# Patient Record
Sex: Female | Born: 1945
Health system: Southern US, Community
[De-identification: ages and names within clinical notes are randomized; demographics above are authoritative.]

## PROBLEM LIST (undated history)

## (undated) DIAGNOSIS — N189 Chronic kidney disease, unspecified: Secondary | ICD-10-CM

## (undated) DIAGNOSIS — R112 Nausea with vomiting, unspecified: Secondary | ICD-10-CM

## (undated) DIAGNOSIS — Z9889 Other specified postprocedural states: Secondary | ICD-10-CM

## (undated) DIAGNOSIS — I739 Peripheral vascular disease, unspecified: Secondary | ICD-10-CM

## (undated) DIAGNOSIS — I82409 Acute embolism and thrombosis of unspecified deep veins of unspecified lower extremity: Secondary | ICD-10-CM

## (undated) DIAGNOSIS — R519 Headache, unspecified: Secondary | ICD-10-CM

## (undated) DIAGNOSIS — K5732 Diverticulitis of large intestine without perforation or abscess without bleeding: Secondary | ICD-10-CM

## (undated) DIAGNOSIS — M1990 Unspecified osteoarthritis, unspecified site: Secondary | ICD-10-CM

## (undated) DIAGNOSIS — E785 Hyperlipidemia, unspecified: Secondary | ICD-10-CM

## (undated) DIAGNOSIS — M8580 Other specified disorders of bone density and structure, unspecified site: Secondary | ICD-10-CM

## (undated) DIAGNOSIS — R0600 Dyspnea, unspecified: Secondary | ICD-10-CM

## (undated) DIAGNOSIS — T7840XA Allergy, unspecified, initial encounter: Secondary | ICD-10-CM

## (undated) DIAGNOSIS — I10 Essential (primary) hypertension: Secondary | ICD-10-CM

## (undated) DIAGNOSIS — J302 Other seasonal allergic rhinitis: Secondary | ICD-10-CM

## (undated) DIAGNOSIS — N649 Disorder of breast, unspecified: Secondary | ICD-10-CM

## (undated) DIAGNOSIS — E079 Disorder of thyroid, unspecified: Secondary | ICD-10-CM

## (undated) HISTORY — DX: Disorder of breast, unspecified: N64.9

## (undated) HISTORY — DX: Other specified disorders of bone density and structure, unspecified site: M85.80

## (undated) HISTORY — PX: WISDOM TOOTH EXTRACTION: SHX21

## (undated) HISTORY — PX: APPENDECTOMY: SHX54

## (undated) HISTORY — DX: Allergy, unspecified, initial encounter: T78.40XA

## (undated) HISTORY — DX: Acute embolism and thrombosis of unspecified deep veins of unspecified lower extremity: I82.409

## (undated) HISTORY — DX: Other seasonal allergic rhinitis: J30.2

## (undated) HISTORY — PX: BREAST BIOPSY: SHX20

## (undated) HISTORY — DX: Disorder of thyroid, unspecified: E07.9

## (undated) HISTORY — PX: ROTATOR CUFF REPAIR: SHX139

## (undated) HISTORY — PX: VARICOSE VEIN SURGERY: SHX832

## (undated) HISTORY — PX: ABDOMINAL HYSTERECTOMY: SHX81

## (undated) HISTORY — DX: Essential (primary) hypertension: I10

## (undated) HISTORY — PX: THYROID SURGERY: SHX805

## (undated) HISTORY — PX: EYE SURGERY: SHX253

## (undated) HISTORY — DX: Hyperlipidemia, unspecified: E78.5

## (undated) HISTORY — DX: Chronic kidney disease, unspecified: N18.9

---

## 1965-05-10 HISTORY — PX: THYROID SURGERY: SHX805

## 1978-05-10 DIAGNOSIS — C50919 Malignant neoplasm of unspecified site of unspecified female breast: Secondary | ICD-10-CM

## 1978-05-10 HISTORY — DX: Malignant neoplasm of unspecified site of unspecified female breast: C50.919

## 1982-05-10 HISTORY — PX: MASTECTOMY: SHX3

## 1998-04-07 ENCOUNTER — Other Ambulatory Visit: Admission: RE | Admit: 1998-04-07 | Discharge: 1998-04-07 | Payer: Self-pay | Admitting: Obstetrics & Gynecology

## 1998-08-08 ENCOUNTER — Encounter: Payer: Self-pay | Admitting: *Deleted

## 1998-08-08 ENCOUNTER — Ambulatory Visit (HOSPITAL_COMMUNITY): Admission: RE | Admit: 1998-08-08 | Discharge: 1998-08-08 | Payer: Self-pay | Admitting: *Deleted

## 1999-04-21 ENCOUNTER — Other Ambulatory Visit: Admission: RE | Admit: 1999-04-21 | Discharge: 1999-04-21 | Payer: Self-pay | Admitting: Obstetrics & Gynecology

## 1999-08-18 ENCOUNTER — Encounter: Admission: RE | Admit: 1999-08-18 | Discharge: 1999-08-18 | Payer: Self-pay | Admitting: Family Medicine

## 1999-08-18 ENCOUNTER — Encounter: Payer: Self-pay | Admitting: Family Medicine

## 2000-08-23 ENCOUNTER — Ambulatory Visit (HOSPITAL_COMMUNITY): Admission: RE | Admit: 2000-08-23 | Discharge: 2000-08-23 | Payer: Self-pay | Admitting: Family Medicine

## 2000-08-23 ENCOUNTER — Encounter: Payer: Self-pay | Admitting: Family Medicine

## 2001-08-30 ENCOUNTER — Encounter: Payer: Self-pay | Admitting: Family Medicine

## 2001-08-30 ENCOUNTER — Ambulatory Visit (HOSPITAL_COMMUNITY): Admission: RE | Admit: 2001-08-30 | Discharge: 2001-08-30 | Payer: Self-pay | Admitting: Family Medicine

## 2002-10-02 ENCOUNTER — Encounter: Payer: Self-pay | Admitting: Family Medicine

## 2002-10-02 ENCOUNTER — Ambulatory Visit (HOSPITAL_COMMUNITY): Admission: RE | Admit: 2002-10-02 | Discharge: 2002-10-02 | Payer: Self-pay | Admitting: Family Medicine

## 2003-10-15 ENCOUNTER — Ambulatory Visit (HOSPITAL_COMMUNITY): Admission: RE | Admit: 2003-10-15 | Discharge: 2003-10-15 | Payer: Self-pay | Admitting: Obstetrics & Gynecology

## 2003-11-04 ENCOUNTER — Other Ambulatory Visit: Admission: RE | Admit: 2003-11-04 | Discharge: 2003-11-04 | Payer: Self-pay | Admitting: Obstetrics & Gynecology

## 2004-10-23 ENCOUNTER — Ambulatory Visit (HOSPITAL_COMMUNITY): Admission: RE | Admit: 2004-10-23 | Discharge: 2004-10-23 | Payer: Self-pay | Admitting: Obstetrics & Gynecology

## 2005-10-29 ENCOUNTER — Ambulatory Visit (HOSPITAL_COMMUNITY): Admission: RE | Admit: 2005-10-29 | Discharge: 2005-10-29 | Payer: Self-pay | Admitting: Obstetrics & Gynecology

## 2006-12-20 ENCOUNTER — Ambulatory Visit (HOSPITAL_COMMUNITY): Admission: RE | Admit: 2006-12-20 | Discharge: 2006-12-20 | Payer: Self-pay | Admitting: Obstetrics & Gynecology

## 2007-12-25 ENCOUNTER — Ambulatory Visit (HOSPITAL_COMMUNITY): Admission: RE | Admit: 2007-12-25 | Discharge: 2007-12-25 | Payer: Self-pay | Admitting: Obstetrics & Gynecology

## 2009-01-07 ENCOUNTER — Ambulatory Visit (HOSPITAL_COMMUNITY): Admission: RE | Admit: 2009-01-07 | Discharge: 2009-01-07 | Payer: Self-pay | Admitting: Obstetrics & Gynecology

## 2009-05-10 HISTORY — PX: COLONOSCOPY: SHX174

## 2009-12-31 LAB — HM DEXA SCAN

## 2010-01-13 ENCOUNTER — Ambulatory Visit (HOSPITAL_COMMUNITY): Admission: RE | Admit: 2010-01-13 | Discharge: 2010-01-13 | Payer: Self-pay | Admitting: Obstetrics & Gynecology

## 2010-02-06 ENCOUNTER — Encounter (INDEPENDENT_AMBULATORY_CARE_PROVIDER_SITE_OTHER): Payer: Self-pay | Admitting: *Deleted

## 2010-02-09 ENCOUNTER — Ambulatory Visit: Payer: Self-pay | Admitting: Gastroenterology

## 2010-02-23 ENCOUNTER — Ambulatory Visit: Payer: Self-pay | Admitting: Gastroenterology

## 2010-02-25 ENCOUNTER — Encounter: Payer: Self-pay | Admitting: Gastroenterology

## 2010-06-09 NOTE — Miscellaneous (Signed)
Summary: previsit prep/rm  Clinical Lists Changes  Medications: Added new medication of MOVIPREP 100 GM  SOLR (PEG-KCL-NACL-NASULF-NA ASC-C) As per prep instructions. - Signed Rx of MOVIPREP 100 GM  SOLR (PEG-KCL-NACL-NASULF-NA ASC-C) As per prep instructions.;  #1 x 0;  Signed;  Entered by: Sherren Kerns RN;  Authorized by: Louis Meckel MD;  Method used: Electronically to [facility] [address], [place], [place], [place]  [address], Ph: [phone], Fax: [phone] Allergies: Added new allergy or adverse reaction of PENICILLIN Added new allergy or adverse reaction of DOXYCYCLINE Added new allergy or adverse reaction of IODINE Added new allergy or adverse reaction of CODEINE Observations: Added new observation of NKA: F (02/09/2010 16:30)    Prescriptions: MOVIPREP 100 GM  SOLR (PEG-KCL-NACL-NASULF-NA ASC-C) As per prep instructions.  #1 x 0   Entered by:   Sherren Kerns RN   Authorized by:   Louis Meckel MD   Signed by:   Sherren Kerns RN on 02/09/2010   Method used:   Electronically to        [facility] (retail)       [address]       [place], [place]  [address]       Ph: [phone]       Fax: [phone]   RxID:   [id number]

## 2010-06-09 NOTE — Letter (Signed)
Summary: Patient Notice-Hyperplastic Polyps  [facility]  [address] [place], [place] [address]   Phone: [phone]  Fax: [phone]        February 25, 2010 MRN: [id number]    April Weber [address] [place], [place]  [address]    Dear [name],  I am pleased to inform you that the colon polyp(s) removed during your recent colonoscopy was (were) found to be hyperplastic.  These types of polyps are NOT pre-cancerous.  It is therefore my recommendation that you have a repeat colonoscopy examination in 10_ years for routine colorectal cancer screening.  Should you develop new or worsening symptoms of abdominal pain, bowel habit changes or bleeding from the rectum or bowels, please schedule an evaluation with either your primary care physician or with me.  Additional information/recommendations:  __No further action with gastroenterology is needed at this time.      Please follow-up with your primary care physician for your other      healthcare needs. __Please call [phone] to schedule a return visit to review      your situation.  __Please keep your follow-up visit as already scheduled.  _x_Continue treatment plan as outlined the day of your exam.  Please call us if you are having persistent problems or have questions about your condition that have not been fully answered at this time.  Sincerely,  Louis Meckel MD This letter has been electronically signed by your physician.  Appended Document: Patient Notice-Hyperplastic Polyps letter mailed

## 2010-06-09 NOTE — Letter (Signed)
Summary: [facility] Instructions  [facility]  [address] [place], [place] [address]   Phone: [phone]  Fax: [phone]       April Weber    05-Oct-1945    MRN: [id number]        Procedure Day Dorna Bloom:  Duanne Limerick  02/23/10     Arrival Time:  9:00AM     Procedure Time:  10:00AM     Location of Procedure:                    Juliann Pares _  [facility] (4th Floor)                      PREPARATION FOR COLONOSCOPY WITH MOVIPREP   Starting 5 days prior to your procedure 02/18/10 do not eat nuts, seeds, popcorn, corn, beans, peas,  salads, or any raw vegetables.  Do not take any fiber supplements (e.g. Metamucil, Citrucel, and Benefiber).  THE DAY BEFORE YOUR PROCEDURE         DATE: 02/22/10   DAY: SUNDAY  1.  Drink clear liquids the entire day-NO SOLID FOOD  2.  Do not drink anything colored red or purple.  Avoid juices with pulp.  No orange juice.  3.  Drink at least 64 oz. (8 glasses) of fluid/clear liquids during the day to prevent dehydration and help the prep work efficiently.  CLEAR LIQUIDS INCLUDE: Water Jello Ice Popsicles Tea (sugar ok, no milk/cream) Powdered fruit flavored drinks Coffee (sugar ok, no milk/cream) Gatorade Juice: apple, white grape, white cranberry  Lemonade Clear bullion, consomm, broth Carbonated beverages (any kind) Strained chicken noodle soup Hard Candy                             4.  In the morning, mix first dose of MoviPrep solution:    Empty 1 Pouch A and 1 Pouch B into the disposable container    Add lukewarm drinking water to the top line of the container. Mix to dissolve    Refrigerate (mixed solution should be used within 24 hrs)  5.  Begin drinking the prep at 5:00 p.m. The MoviPrep container is divided by 4 marks.   Every 15 minutes drink the solution down to the next mark (approximately 8 oz) until the full liter is complete.   6.  Follow completed prep with 16 oz of clear liquid of your choice  (Nothing red or purple).  Continue to drink clear liquids until bedtime.  7.  Before going to bed, mix second dose of MoviPrep solution:    Empty 1 Pouch A and 1 Pouch B into the disposable container    Add lukewarm drinking water to the top line of the container. Mix to dissolve    Refrigerate  THE DAY OF YOUR PROCEDURE      DATE: 02/23/10  DAY: MONDAY  Beginning at 5:00AM (5 hours before procedure):         1. Every 15 minutes, drink the solution down to the next mark (approx 8 oz) until the full liter is complete.  2. Follow completed prep with 16 oz. of clear liquid of your choice.    3. You may drink clear liquids until 8:00AM (2 HOURS BEFORE PROCEDURE).   MEDICATION INSTRUCTIONS  Unless otherwise instructed, you should take regular prescription medications with a small sip of water   as early as possible the morning of  your procedure.   Additional medication instructions:  n/a         OTHER INSTRUCTIONS  You will need a responsible adult at least 66 years of age to accompany you and drive you home.   This person must remain in the waiting room during your procedure.  Wear loose fitting clothing that is easily removed.  Leave jewelry and other valuables at home.  However, you may wish to bring a book to read or  an iPod/MP3 player to listen to music as you wait for your procedure to start.  Remove all body piercing jewelry and leave at home.  Total time from sign-in until discharge is approximately 2-3 hours.  You should go home directly after your procedure and rest.  You can resume normal activities the  day after your procedure.  The day of your procedure you should not:   Drive   Make legal decisions   Operate machinery   Drink alcohol   Return to work  You will receive specific instructions about eating, activities and medications before you leave.    The above instructions have been reviewed and explained to me by   Sherren Kerns RN  February 09, 2010 4:51 PM    I fully understand and can verbalize these instructions _____________________________ Date _________

## 2010-06-09 NOTE — Procedures (Signed)
Summary: Colonoscopy  Patient: April Weber Note: All result statuses are Final unless otherwise noted.  Tests: (1) Colonoscopy (COL)   COL Colonoscopy           DONE     [facility]     [address]     [place], [place]  [address]           COLONOSCOPY PROCEDURE REPORT           PATIENT:  April Weber, April Weber  MR#:  [id number]     BIRTHDATE:  [birth year], 64 yrs. old  GENDER:  female           ENDOSCOPIST:  Barbette Hair. Arlyce Dice, MD     Referred by:           PROCEDURE DATE:  02/23/2010     PROCEDURE:  Colonoscopy with snare polypectomy, Colon with cold     biopsy polypectomy     ASA CLASS:  Class II     INDICATIONS:  1) Routine Risk Screening           MEDICATIONS:   Fentanyl 75 mcg IV, Versed 8 mg IV           DESCRIPTION OF PROCEDURE:   After the risks benefits and     alternatives of the procedure were thoroughly explained, informed     consent was obtained.  Digital rectal exam was performed and     revealed no abnormalities.   The [id number] endoscope was     introduced through the anus and advanced to the cecum, which was     identified by both the appendix and ileocecal valve, without     limitations.  The quality of the prep was excellent, using     MiraLax.  The instrument was then slowly withdrawn as the colon     was fully examined.     <<PROCEDUREIMAGES>>           FINDINGS:  A sessile polyp was found in the proximal transverse     colon. It was 3 mm in size. Polyp was snared without cautery.     Retrieval was successful (see image9). snare polyp  A sessile     polyp was found in the rectum. It was 2 mm in size. It was found 2     cm from the point of entry. The polyp was removed using cold     biopsy forceps (see image15).  Severe diverticulosis was found in     the sigmoid colon (see image1 and image14).  Scattered diverticula     were found in the transverse colon.  Internal hemorrhoids were     found (see image16).  This was otherwise a normal  examination of     the colon (see image3, image4, image5, image7, and image10).     Retroflexed views in the rectum revealed internal hemorrhoids.     The time to cecum =  5.0  minutes. The scope was then withdrawn (time     =  10.75  min) from the patient and the procedure completed.           COMPLICATIONS:  None           ENDOSCOPIC IMPRESSION:     1) 3 mm sessile polyp in the proximal transverse colon     2) 2 mm sessile polyp in the rectum     3) Severe diverticulosis in the sigmoid colon     4) Diverticula,  scattered in the transverse colon     5) Otherwise normal examination     6) Internal hemorrhoids     RECOMMENDATIONS:     1) If the polyp(s) removed today are proven to be adenomatous     (pre-cancerous) polyps, you will need a repeat colonoscopy in 5     years. Otherwise you should continue to follow colorectal cancer     screening guidelines for "routine risk" patients with colonoscopy     in 10 years.           REPEAT EXAM:   You will receive a letter from Dr. Arlyce Dice in 1-2     weeks, after reviewing the final pathology, with followup     recommendations.           ______________________________     Barbette Hair Arlyce Dice, MD           CC: [name], MD           n.     Rosalie Doctor:   Barbette Hair. Kaplan at 02/23/2010 09:49 AM           Page 2 of 3   Yan, [place], [id number]  Note: An exclamation mark (!) indicates a result that was not dispersed into the flowsheet. Document Creation Date: 02/23/2010 9:51 AM _______________________________________________________________________  (1) Order result status: Final Collection or observation date-time: 02/23/2010 09:43 Requested date-time:  Receipt date-time:  Reported date-time:  Referring Physician:   Ordering Physician: Melvia Heaps [phone]) Specimen Source:  Source: Launa Grill Order Number: [id number] Lab site:   Appended Document: Colonoscopy     Procedures Next Due Date:    Colonoscopy: 02/2020

## 2010-08-12 ENCOUNTER — Encounter: Payer: Self-pay | Admitting: Nurse Practitioner

## 2010-08-12 DIAGNOSIS — I10 Essential (primary) hypertension: Secondary | ICD-10-CM | POA: Insufficient documentation

## 2010-08-12 DIAGNOSIS — E785 Hyperlipidemia, unspecified: Secondary | ICD-10-CM | POA: Insufficient documentation

## 2010-08-12 DIAGNOSIS — Z853 Personal history of malignant neoplasm of breast: Secondary | ICD-10-CM | POA: Insufficient documentation

## 2010-08-12 DIAGNOSIS — M8580 Other specified disorders of bone density and structure, unspecified site: Secondary | ICD-10-CM | POA: Insufficient documentation

## 2010-08-12 DIAGNOSIS — C50919 Malignant neoplasm of unspecified site of unspecified female breast: Secondary | ICD-10-CM

## 2011-01-14 ENCOUNTER — Other Ambulatory Visit (HOSPITAL_COMMUNITY): Payer: Self-pay | Admitting: Obstetrics & Gynecology

## 2011-01-14 DIAGNOSIS — Z1231 Encounter for screening mammogram for malignant neoplasm of breast: Secondary | ICD-10-CM

## 2011-01-15 ENCOUNTER — Ambulatory Visit (HOSPITAL_COMMUNITY)
Admission: RE | Admit: 2011-01-15 | Discharge: 2011-01-15 | Disposition: A | Discharge status: Home / Self Care | Payer: Medicare Other | Source: Ambulatory Visit | Attending: Obstetrics & Gynecology | Admitting: Obstetrics & Gynecology

## 2011-01-15 DIAGNOSIS — Z1231 Encounter for screening mammogram for malignant neoplasm of breast: Secondary | ICD-10-CM | POA: Insufficient documentation

## 2011-05-24 DIAGNOSIS — H2510 Age-related nuclear cataract, unspecified eye: Secondary | ICD-10-CM | POA: Diagnosis not present

## 2011-05-24 DIAGNOSIS — H52229 Regular astigmatism, unspecified eye: Secondary | ICD-10-CM | POA: Diagnosis not present

## 2011-05-24 DIAGNOSIS — H524 Presbyopia: Secondary | ICD-10-CM | POA: Diagnosis not present

## 2011-05-24 DIAGNOSIS — H5210 Myopia, unspecified eye: Secondary | ICD-10-CM | POA: Diagnosis not present

## 2011-06-01 DIAGNOSIS — D235 Other benign neoplasm of skin of trunk: Secondary | ICD-10-CM | POA: Diagnosis not present

## 2011-07-27 DIAGNOSIS — E559 Vitamin D deficiency, unspecified: Secondary | ICD-10-CM | POA: Diagnosis not present

## 2011-07-27 DIAGNOSIS — E785 Hyperlipidemia, unspecified: Secondary | ICD-10-CM | POA: Diagnosis not present

## 2011-07-27 DIAGNOSIS — I10 Essential (primary) hypertension: Secondary | ICD-10-CM | POA: Diagnosis not present

## 2011-07-27 DIAGNOSIS — E039 Hypothyroidism, unspecified: Secondary | ICD-10-CM | POA: Diagnosis not present

## 2011-08-03 DIAGNOSIS — E039 Hypothyroidism, unspecified: Secondary | ICD-10-CM | POA: Diagnosis not present

## 2011-08-03 DIAGNOSIS — I10 Essential (primary) hypertension: Secondary | ICD-10-CM | POA: Diagnosis not present

## 2011-09-22 DIAGNOSIS — M722 Plantar fascial fibromatosis: Secondary | ICD-10-CM | POA: Diagnosis not present

## 2011-09-22 DIAGNOSIS — M79609 Pain in unspecified limb: Secondary | ICD-10-CM | POA: Diagnosis not present

## 2011-11-02 DIAGNOSIS — R7989 Other specified abnormal findings of blood chemistry: Secondary | ICD-10-CM | POA: Diagnosis not present

## 2011-11-02 DIAGNOSIS — E559 Vitamin D deficiency, unspecified: Secondary | ICD-10-CM | POA: Diagnosis not present

## 2012-01-14 ENCOUNTER — Other Ambulatory Visit (HOSPITAL_COMMUNITY): Payer: Self-pay | Admitting: Obstetrics & Gynecology

## 2012-01-14 DIAGNOSIS — Z1231 Encounter for screening mammogram for malignant neoplasm of breast: Secondary | ICD-10-CM

## 2012-01-20 ENCOUNTER — Other Ambulatory Visit (HOSPITAL_COMMUNITY): Payer: Self-pay | Admitting: Obstetrics & Gynecology

## 2012-01-20 ENCOUNTER — Ambulatory Visit (HOSPITAL_COMMUNITY)
Admission: RE | Admit: 2012-01-20 | Discharge: 2012-01-20 | Disposition: A | Discharge status: Home / Self Care | Payer: Medicare Other | Source: Ambulatory Visit | Attending: Obstetrics & Gynecology | Admitting: Obstetrics & Gynecology

## 2012-01-20 DIAGNOSIS — Z853 Personal history of malignant neoplasm of breast: Secondary | ICD-10-CM

## 2012-01-20 DIAGNOSIS — Z1231 Encounter for screening mammogram for malignant neoplasm of breast: Secondary | ICD-10-CM | POA: Insufficient documentation

## 2012-01-20 IMAGING — MG MM DIGITAL SCREENING BILAT
2 series · 2 of 2 positions shown · non-contrast
Comparison: None.

CLINICAL DATA: Screening.

LEFT DIGITAL SCREENING MAMMOGRAM WITH CAD

[L CC]
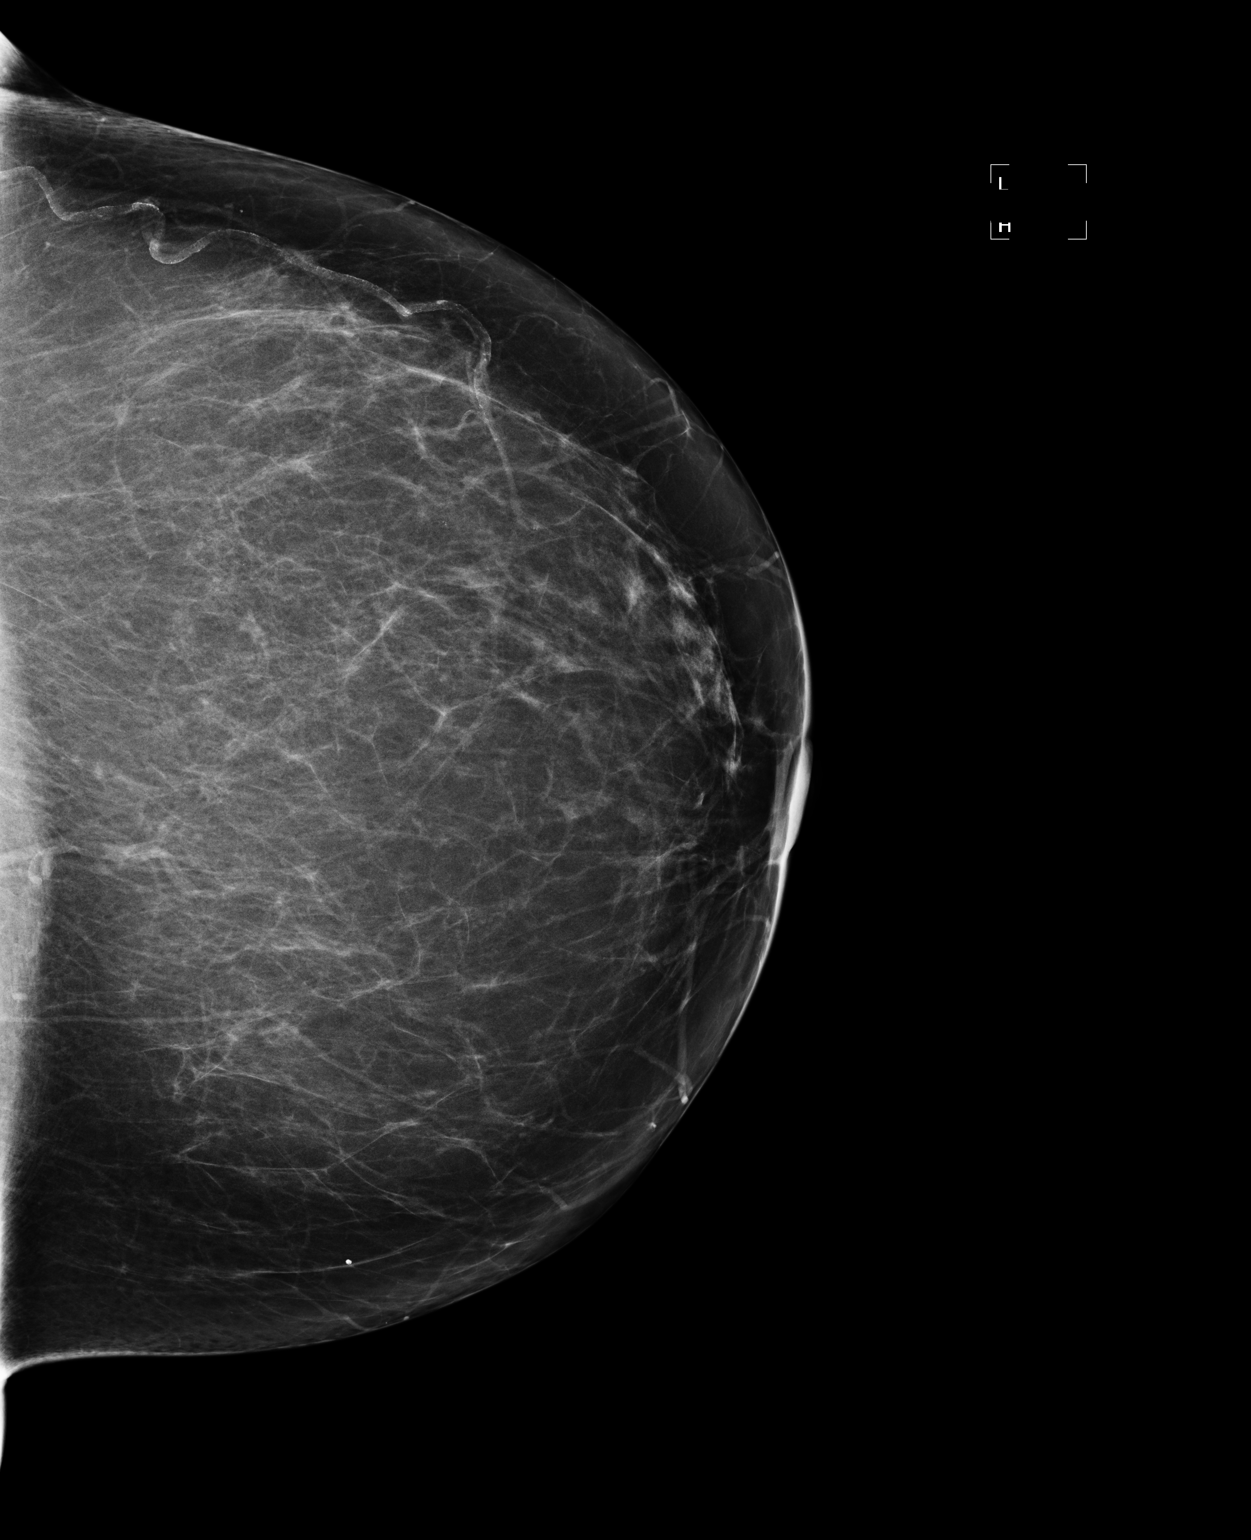

[L MLO]
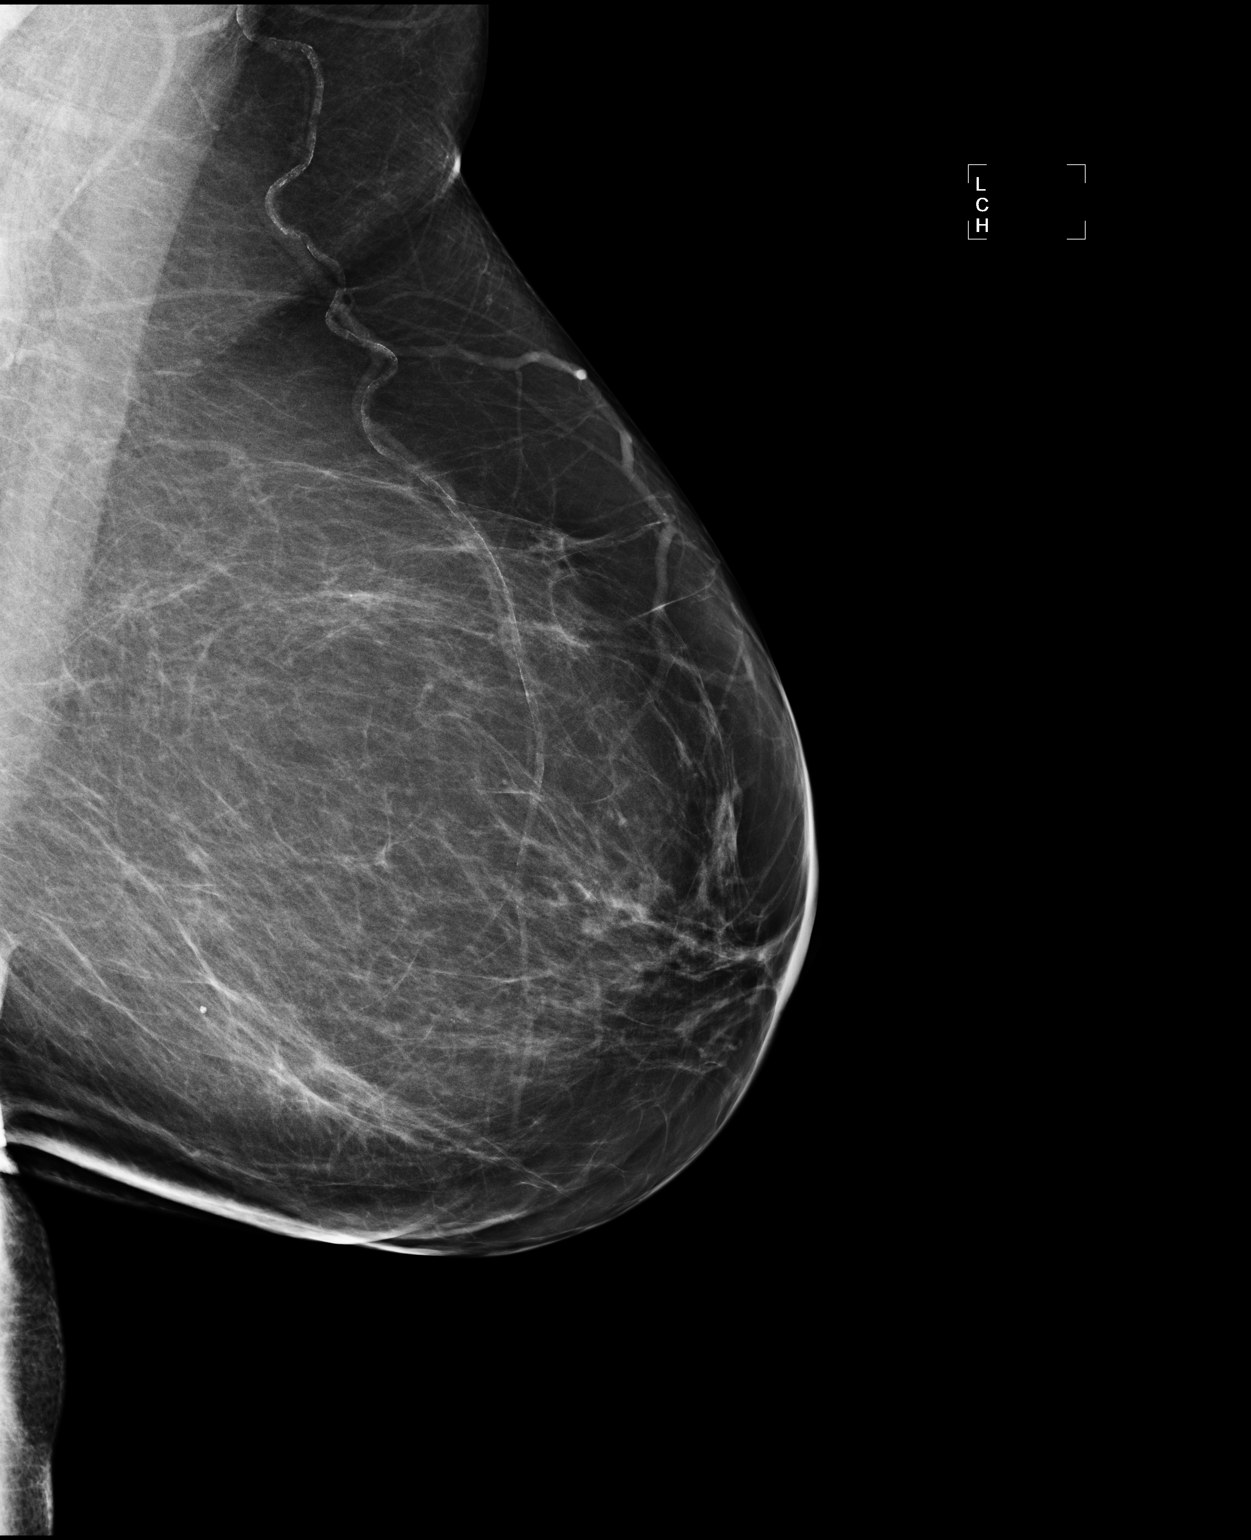

[2 of 2 positions shown; findings below may reference images not displayed]

FINDINGS: The patient has had a right mastectomy.  Views of the
left breast demonstrate scattered fibroglandular tissue.  No
suspicious masses, architectural distortion, or calcifications are
present.

Images were processed with CAD.
IMPRESSION: No evidence of malignancy.  Screening mammography is recommended in
one year.

RECOMMENDATION:
Screening mammogram in one year. (Code:[M7])

BI-RADS CATEGORY 1:  Negative.

## 2012-03-14 DIAGNOSIS — Z23 Encounter for immunization: Secondary | ICD-10-CM | POA: Diagnosis not present

## 2012-06-01 DIAGNOSIS — H2510 Age-related nuclear cataract, unspecified eye: Secondary | ICD-10-CM | POA: Diagnosis not present

## 2012-06-01 DIAGNOSIS — H52229 Regular astigmatism, unspecified eye: Secondary | ICD-10-CM | POA: Diagnosis not present

## 2012-06-01 DIAGNOSIS — H5210 Myopia, unspecified eye: Secondary | ICD-10-CM | POA: Diagnosis not present

## 2012-06-01 DIAGNOSIS — H1045 Other chronic allergic conjunctivitis: Secondary | ICD-10-CM | POA: Diagnosis not present

## 2012-07-27 ENCOUNTER — Other Ambulatory Visit (INDEPENDENT_AMBULATORY_CARE_PROVIDER_SITE_OTHER): Payer: Medicare Other

## 2012-07-27 DIAGNOSIS — I10 Essential (primary) hypertension: Secondary | ICD-10-CM

## 2012-07-27 DIAGNOSIS — E785 Hyperlipidemia, unspecified: Secondary | ICD-10-CM

## 2012-07-27 DIAGNOSIS — E559 Vitamin D deficiency, unspecified: Secondary | ICD-10-CM

## 2012-07-27 DIAGNOSIS — E039 Hypothyroidism, unspecified: Secondary | ICD-10-CM

## 2012-07-27 DIAGNOSIS — R5381 Other malaise: Secondary | ICD-10-CM

## 2012-07-27 LAB — POCT CBC
Granulocyte percent: 78 %G (ref 37–80)
HCT, POC: 46.5 % (ref 37.7–47.9)
Hemoglobin: 15.1 g/dL (ref 12.2–16.2)
Lymph, poc: 1.4 (ref 0.6–3.4)
MCH, POC: 29.2 pg (ref 27–31.2)
MCHC: 32.6 g/dL (ref 31.8–35.4)
MCV: 89.8 fL (ref 80–97)
MPV: 8.6 fL (ref 0–99.8)
POC Granulocyte: 6.2 (ref 2–6.9)
POC LYMPH PERCENT: 16.9 %L (ref 10–50)
Platelet Count, POC: 219 10*3/uL (ref 142–424)
RBC: 5.2 M/uL (ref 4.04–5.48)
RDW, POC: 12.8 %
WBC: 8 10*3/uL (ref 4.6–10.2)

## 2012-07-27 NOTE — Progress Notes (Signed)
Patient came in for labs only.

## 2012-07-27 NOTE — Progress Notes (Signed)
Patient here for labs only. 

## 2012-07-28 LAB — THYROID PANEL WITH TSH
Free Thyroxine Index: 3.8 (ref 1.0–3.9)
T3 Uptake: 34.3 % (ref 22.5–37.0)
T4, Total: 11.1 ug/dL (ref 5.0–12.5)
TSH: 1.038 u[IU]/mL (ref 0.350–4.500)

## 2012-07-28 LAB — CMP AND LIVER
ALT: 20 U/L (ref 0–35)
AST: 19 U/L (ref 0–37)
Albumin: 4.4 g/dL (ref 3.5–5.2)
Alkaline Phosphatase: 63 U/L (ref 39–117)
BUN: 20 mg/dL (ref 6–23)
Bilirubin, Direct: 0.1 mg/dL (ref 0.0–0.3)
CO2: 28 mEq/L (ref 19–32)
Calcium: 9.5 mg/dL (ref 8.4–10.5)
Chloride: 105 mEq/L (ref 96–112)
Creat: 0.96 mg/dL (ref 0.50–1.10)
Glucose, Bld: 101 mg/dL — ABNORMAL HIGH (ref 70–99)
Indirect Bilirubin: 0.6 mg/dL (ref 0.0–0.9)
Potassium: 4.9 mEq/L (ref 3.5–5.3)
Sodium: 142 mEq/L (ref 135–145)
Total Bilirubin: 0.7 mg/dL (ref 0.3–1.2)
Total Protein: 6.7 g/dL (ref 6.0–8.3)

## 2012-07-28 LAB — VITAMIN D 25 HYDROXY (VIT D DEFICIENCY, FRACTURES): Vit D, 25-Hydroxy: 59 ng/mL (ref 30–89)

## 2012-07-28 LAB — NMR, LIPOPROFILE

## 2012-08-01 ENCOUNTER — Encounter: Payer: Self-pay | Admitting: Family Medicine

## 2012-08-04 ENCOUNTER — Encounter: Payer: Self-pay | Admitting: Family Medicine

## 2012-08-04 ENCOUNTER — Ambulatory Visit (INDEPENDENT_AMBULATORY_CARE_PROVIDER_SITE_OTHER): Payer: Medicare Other | Admitting: Family Medicine

## 2012-08-04 VITALS — BP 153/84 | HR 58 | Temp 98.1°F | Ht 66.5 in | Wt 179.6 lb

## 2012-08-04 DIAGNOSIS — E785 Hyperlipidemia, unspecified: Secondary | ICD-10-CM

## 2012-08-04 DIAGNOSIS — M949 Disorder of cartilage, unspecified: Secondary | ICD-10-CM

## 2012-08-04 DIAGNOSIS — I10 Essential (primary) hypertension: Secondary | ICD-10-CM

## 2012-08-04 DIAGNOSIS — R739 Hyperglycemia, unspecified: Secondary | ICD-10-CM

## 2012-08-04 DIAGNOSIS — M899 Disorder of bone, unspecified: Secondary | ICD-10-CM

## 2012-08-04 DIAGNOSIS — E039 Hypothyroidism, unspecified: Secondary | ICD-10-CM | POA: Insufficient documentation

## 2012-08-04 DIAGNOSIS — M8580 Other specified disorders of bone density and structure, unspecified site: Secondary | ICD-10-CM

## 2012-08-04 DIAGNOSIS — R7309 Other abnormal glucose: Secondary | ICD-10-CM

## 2012-08-04 MED ORDER — LEVOTHYROXINE SODIUM 100 MCG PO TABS: 100.0000 ug | ORAL_TABLET | Freq: Every day | ORAL | Status: DC

## 2012-08-04 MED ORDER — ATENOLOL 50 MG PO TABS: 50.0000 mg | ORAL_TABLET | Freq: Every day | ORAL | Status: DC

## 2012-08-04 MED ORDER — LOSARTAN POTASSIUM 50 MG PO TABS: 50.0000 mg | ORAL_TABLET | Freq: Every day | ORAL | Status: DC

## 2012-08-04 MED ORDER — SIMVASTATIN 20 MG PO TABS: 20.0000 mg | ORAL_TABLET | Freq: Every day | ORAL | Status: DC
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
## 2012-08-04 NOTE — Patient Instructions (Signed)
Diet and Exercise discussed with patient. For nutrition information, I recommended books: Eat to Live by Dr Monico Hoar. Prevent and Reverse Heart Disease by Dr Suzzette Righter.  Exercise recommendations are:  If unable to walk, then the patient can exercise in a chair 3 times a day. By flapping arms like a bird gently and raising legs outwards to the front.  If ambulatory, the patient can go for walks for 30 minutes 3 times a week. Then increase the intensity and duration as tolerated. Goal is to try to attain exercise frequency to 5 times a week. Best to perform resistance exercises 2 days a week and cardio type exercises 3 days per week.

## 2012-08-18 ENCOUNTER — Encounter: Payer: Self-pay | Admitting: Family Medicine

## 2012-08-18 NOTE — Progress Notes (Signed)
Quick Note:  Lab result at goal. No change in Medications for now. No Change in plans and follow up. ______ 

## 2012-09-27 ENCOUNTER — Encounter: Payer: Self-pay | Admitting: Family Medicine

## 2012-09-27 ENCOUNTER — Ambulatory Visit (INDEPENDENT_AMBULATORY_CARE_PROVIDER_SITE_OTHER): Payer: Medicare Other | Admitting: Family Medicine

## 2012-09-27 VITALS — BP 169/88 | HR 59 | Temp 97.7°F | Ht 67.0 in | Wt 171.0 lb

## 2012-09-27 DIAGNOSIS — E039 Hypothyroidism, unspecified: Secondary | ICD-10-CM

## 2012-09-27 DIAGNOSIS — M8580 Other specified disorders of bone density and structure, unspecified site: Secondary | ICD-10-CM

## 2012-09-27 DIAGNOSIS — M899 Disorder of bone, unspecified: Secondary | ICD-10-CM

## 2012-09-27 DIAGNOSIS — I10 Essential (primary) hypertension: Secondary | ICD-10-CM

## 2012-09-27 DIAGNOSIS — E785 Hyperlipidemia, unspecified: Secondary | ICD-10-CM

## 2012-09-27 DIAGNOSIS — C50919 Malignant neoplasm of unspecified site of unspecified female breast: Secondary | ICD-10-CM

## 2012-09-27 MED ORDER — ATENOLOL 25 MG PO TABS: 25.0000 mg | ORAL_TABLET | Freq: Every day | ORAL | Status: DC

## 2012-09-27 NOTE — Progress Notes (Signed)
Patient ID: April Weber, female   DOB: Sep 07, 1945, 67 y.o.   MRN: [id number] SUBJECTIVE: HPI: Patient is here for follow up of hypertensionhyperlipidemia/hypothyroidism:  denies Headache;deniesChest Pain; has episodes of weakness;denies Shortness of Breath or Orthopnea;denies Visual changes;denies palpitations;denies cough;denies pedal edema;denies symptoms of TIA or stroke; admits to Compliance with medications. denies Problems with medications.  BP readings: 110 to 90 systolic DBP 60s. Thinks she needs to be on less meds.   PMH/PSH: reviewed/updated in Epic  SH/FH: reviewed/updated in Epic  Allergies: reviewed/updated in Epic  Medications: reviewed/updated in Epic  Immunizations: reviewed/updated in Epic  ROS: As above in the HPI. All other systems are stable or negative.  OBJECTIVE: APPEARANCE:  Patient in no acute distress.The patient appeared well nourished and normally developed. Acyanotic. Waist: VITAL SIGNS:BP 169/88  Pulse 59  Temp(Src) 97.7 F (36.5 C) (Oral)  Ht 5\' 7"  (1.702 m)  Wt 171 lb (77.565 kg)  BMI 26.78 kg/m2 BP recheck 122/70  SKIN: warm and  Dry without overt rashes, tattoos and scars  HEAD and Neck: without JVD, Head and scalp: normal Eyes:No scleral icterus. Fundi normal, eye movements normal. Ears: Auricle normal, canal normal, Tympanic membranes normal, insufflation normal. Nose: normal Throat: normal Neck & thyroid: normal  CHEST & LUNGS: Chest wall: normal Lungs: Clear  CVS: Reveals the PMI to be normally located. Regular rhythm, First and Second Heart sounds are normal,  absence of murmurs, rubs or gallops. Peripheral vasculature: Radial pulses: normal Dorsal pedis pulses: normal Posterior pulses: normal  ABDOMEN:  Appearance: normal Benign,, no organomegaly, no masses, no Abdominal Aortic enlargement. No Guarding , no rebound. No Bruits. Bowel sounds: normal  RECTAL: N/A GU: N/A  EXTREMETIES: nonedematous. Both  Femoral and Pedal pulses are normal.  MUSCULOSKELETAL:  Spine: normal Joints: intact  NEUROLOGIC: oriented to time,place and person; nonfocal. Strength is normal Sensory is normal Reflexes are normal Cranial Nerves are normal.  ASSESSMENT:  HTN (hypertension) - Plan: atenolol (TENORMIN) 25 MG tablet  Hyperlipidemia  Osteopenia  Unspecified hypothyroidism  Breast CA, unspecified laterality  BP is actually too low at home. She has an element of white coat syndrome.  PLAN: Reduce the beta blocker.  No orders of the defined types were placed in this encounter.   Results for orders placed in visit on 08/01/12  HM DEXA SCAN      Result Value Range   HM Dexa Scan done     Meds ordered this encounter  Medications  . atenolol (TENORMIN) 25 MG tablet    Sig: Take 1 tablet (25 mg total) by mouth daily.    Dispense:  30 tablet    Refill:  11   RTC prn or 3 months as planned.  Kalynne Womac P. Modesto Charon, M.D.

## 2012-11-16 ENCOUNTER — Other Ambulatory Visit: Payer: Self-pay | Admitting: Family Medicine

## 2012-11-16 DIAGNOSIS — I10 Essential (primary) hypertension: Secondary | ICD-10-CM

## 2012-11-16 MED ORDER — LOSARTAN POTASSIUM 25 MG PO TABS: 25.0000 mg | ORAL_TABLET | Freq: Every day | ORAL | Status: DC

## 2012-11-16 NOTE — Progress Notes (Signed)
Brought in her BPs 90s/60s Reduced the beta blockers and feels better. Thinks more med reduction needed. Dietary changes and  Exercise and losing weight and feeling better. Agree with April Weber.  Letoya Stallone P. Modesto Charon, M.D.

## 2013-01-18 ENCOUNTER — Other Ambulatory Visit (HOSPITAL_COMMUNITY): Payer: Self-pay | Admitting: Obstetrics & Gynecology

## 2013-01-18 DIAGNOSIS — Z1231 Encounter for screening mammogram for malignant neoplasm of breast: Secondary | ICD-10-CM

## 2013-01-25 ENCOUNTER — Other Ambulatory Visit (HOSPITAL_COMMUNITY): Payer: Self-pay | Admitting: Obstetrics & Gynecology

## 2013-01-25 ENCOUNTER — Ambulatory Visit (HOSPITAL_COMMUNITY)
Admission: RE | Admit: 2013-01-25 | Discharge: 2013-01-25 | Disposition: A | Discharge status: Home / Self Care | Payer: PRIVATE HEALTH INSURANCE | Source: Ambulatory Visit | Attending: Obstetrics & Gynecology | Admitting: Obstetrics & Gynecology

## 2013-01-25 DIAGNOSIS — Z1231 Encounter for screening mammogram for malignant neoplasm of breast: Secondary | ICD-10-CM | POA: Insufficient documentation

## 2013-02-05 ENCOUNTER — Other Ambulatory Visit (INDEPENDENT_AMBULATORY_CARE_PROVIDER_SITE_OTHER): Payer: Medicare Other

## 2013-02-05 DIAGNOSIS — R739 Hyperglycemia, unspecified: Secondary | ICD-10-CM

## 2013-02-05 DIAGNOSIS — E039 Hypothyroidism, unspecified: Secondary | ICD-10-CM

## 2013-02-05 DIAGNOSIS — I10 Essential (primary) hypertension: Secondary | ICD-10-CM

## 2013-02-05 DIAGNOSIS — E785 Hyperlipidemia, unspecified: Secondary | ICD-10-CM

## 2013-02-05 LAB — HEMOGLOBIN A1C
Hgb A1c MFr Bld: 5.8 % — ABNORMAL HIGH (ref <5.7)
Mean Plasma Glucose: 120 mg/dL — ABNORMAL HIGH (ref <117)

## 2013-02-05 LAB — BASIC METABOLIC PANEL WITH GFR
BUN: 21 mg/dL (ref 6–23)
CO2: 28 mEq/L (ref 19–32)
Calcium: 9.7 mg/dL (ref 8.4–10.5)
Chloride: 105 mEq/L (ref 96–112)
Creat: 0.99 mg/dL (ref 0.50–1.10)
GFR, Est African American: 68 mL/min
GFR, Est Non African American: 59 mL/min — ABNORMAL LOW
Glucose, Bld: 88 mg/dL (ref 70–99)
Potassium: 4.6 mEq/L (ref 3.5–5.3)
Sodium: 141 mEq/L (ref 135–145)

## 2013-02-05 LAB — THYROID PANEL WITH TSH
Free Thyroxine Index: 5.1 — ABNORMAL HIGH (ref 1.0–3.9)
T3 Uptake: 41.5 % — ABNORMAL HIGH (ref 22.5–37.0)
T4, Total: 12.4 ug/dL (ref 5.0–12.5)
TSH: 0.395 u[IU]/mL (ref 0.350–4.500)

## 2013-02-05 LAB — HEPATIC FUNCTION PANEL
ALT: 23 U/L (ref 0–35)
AST: 22 U/L (ref 0–37)
Albumin: 4.2 g/dL (ref 3.5–5.2)
Alkaline Phosphatase: 55 U/L (ref 39–117)
Bilirubin, Direct: 0.2 mg/dL (ref 0.0–0.3)
Indirect Bilirubin: 0.5 mg/dL (ref 0.0–0.9)
Total Bilirubin: 0.7 mg/dL (ref 0.3–1.2)
Total Protein: 6.5 g/dL (ref 6.0–8.3)

## 2013-02-05 NOTE — Progress Notes (Signed)
Pt came in for labs only 

## 2013-02-07 LAB — NMR LIPOPROFILE WITH LIPIDS
Cholesterol, Total: 115 mg/dL (ref <200)
HDL Particle Number: 30.4 umol/L — ABNORMAL LOW (ref >=30.5)
HDL Size: 9.1 nm — ABNORMAL LOW (ref >=9.2)
HDL-C: 41 mg/dL (ref >=40)
LDL (calc): 55 mg/dL (ref <100)
LDL Particle Number: 851 nmol/L (ref <1000)
LDL Size: 20.2 nm — ABNORMAL LOW (ref >20.5)
LP-IR Score: 33 (ref <=45)
Large HDL-P: 7.2 umol/L (ref >=4.8)
Large VLDL-P: 1.4 nmol/L (ref <=2.7)
Small LDL Particle Number: 592 nmol/L — ABNORMAL HIGH (ref <=527)
Triglycerides: 94 mg/dL (ref <150)
VLDL Size: 43.6 nm (ref <=46.6)

## 2013-02-08 ENCOUNTER — Ambulatory Visit: Payer: Medicare Other | Admitting: Family Medicine

## 2013-02-13 ENCOUNTER — Encounter: Payer: Self-pay | Admitting: Family Medicine

## 2013-02-13 ENCOUNTER — Ambulatory Visit (INDEPENDENT_AMBULATORY_CARE_PROVIDER_SITE_OTHER): Payer: Medicare Other | Admitting: Family Medicine

## 2013-02-13 VITALS — BP 148/85 | HR 66 | Temp 97.6°F | Ht 66.5 in | Wt 159.6 lb

## 2013-02-13 DIAGNOSIS — R7309 Other abnormal glucose: Secondary | ICD-10-CM

## 2013-02-13 DIAGNOSIS — Z23 Encounter for immunization: Secondary | ICD-10-CM | POA: Insufficient documentation

## 2013-02-13 DIAGNOSIS — C50919 Malignant neoplasm of unspecified site of unspecified female breast: Secondary | ICD-10-CM

## 2013-02-13 DIAGNOSIS — M8580 Other specified disorders of bone density and structure, unspecified site: Secondary | ICD-10-CM

## 2013-02-13 DIAGNOSIS — E039 Hypothyroidism, unspecified: Secondary | ICD-10-CM

## 2013-02-13 DIAGNOSIS — M899 Disorder of bone, unspecified: Secondary | ICD-10-CM

## 2013-02-13 DIAGNOSIS — I10 Essential (primary) hypertension: Secondary | ICD-10-CM

## 2013-02-13 DIAGNOSIS — R739 Hyperglycemia, unspecified: Secondary | ICD-10-CM

## 2013-02-13 DIAGNOSIS — E785 Hyperlipidemia, unspecified: Secondary | ICD-10-CM | POA: Insufficient documentation

## 2013-02-13 MED ORDER — SIMVASTATIN 10 MG PO TABS: 10.0000 mg | ORAL_TABLET | Freq: Every day | ORAL | Status: DC

## 2013-02-13 NOTE — Patient Instructions (Addendum)
      Dr Alexzavier Girardin's Recommendations  For nutrition information, I recommend books:  1).Eat to Live by Dr Joel Fuhrman. 2).Prevent and Reverse Heart Disease by Dr Caldwell Esselstyn. 3) Dr Neal Barnard's Book:  Program to Reverse Diabetes  Exercise recommendations are:  If unable to walk, then the patient can exercise in a chair 3 times a day. By flapping arms like a bird gently and raising legs outwards to the front.  If ambulatory, the patient can go for walks for 30 minutes 3 times a week. Then increase the intensity and duration as tolerated.  Goal is to try to attain exercise frequency to 5 times a week.  If applicable: Best to perform resistance exercises (machines or weights) 2 days a week and cardio type exercises 3 days per week.  

## 2013-02-13 NOTE — Progress Notes (Signed)
Patient ID: April Weber, female   DOB: [birth year], 67 y.o.   MRN: [id number] SUBJECTIVE: CC: Chief Complaint  Patient presents with  . Follow-up    6 month follow up    HPI:  Patient is here for follow up of hypertension/Hyperlipidemia: denies Headache;deniesChest Pain;denies weakness;denies Shortness of Breath or Orthopnea;denies Visual changes;denies palpitations;denies cough;denies pedal edema;denies symptoms of TIA or stroke; admits to Compliance with medications. denies Problems with medications. BP 90-110/60-70  Breakfast: oatmeal, whole wheat toast and a banana Snack:yoghurt or piece of meat Lunch:piece of meat and veggies Supper chicken salad and  Sandwich.  Past Medical History  Diagnosis Date  . Osteopenia   . Chronic kidney disease     has one kidney  . Hypertension   . Hyperlipidemia   . Thyroid disease    Past Surgical History  Procedure Laterality Date  . Abdominal hysterectomy    . Thyroid surgery    . Mastectomy Right    History   Social History  . Marital Status: Single    Spouse Name: N/A    Number of Children: N/A  . Years of Education: N/A   Occupational History  .      retired   Social History Main Topics  . Smoking status: Former Smoker    Types: Cigarettes    Quit date: 05/10/1958  . Smokeless tobacco: Not on file  . Alcohol Use: No  . Drug Use: No  . Sexual Activity: Not on file   Other Topics Concern  . Not on file   Social History Narrative  . No narrative on file   Family History  Problem Relation Age of Onset  . Heart attack Mother   . Alcohol abuse Mother    Current Outpatient Prescriptions on File Prior to Visit  Medication Sig Dispense Refill  . aspirin 81 MG EC tablet Take 81 mg by mouth daily.        Marland Kitchen atenolol (TENORMIN) 25 MG tablet Take 1 tablet (25 mg total) by mouth daily.  30 tablet  11  . Calcium 600-200 MG-UNIT per tablet Take 1 tablet by mouth daily.        . diclofenac sodium (VOLTAREN) 1 % GEL  Apply 2 g topically 4 (four) times daily.      . fish oil-omega-3 fatty acids 1000 MG capsule Take 2 g by mouth daily.        Marland Kitchen levothyroxine (SYNTHROID, LEVOTHROID) 100 MCG tablet Take 1 tablet (100 mcg total) by mouth daily.  30 tablet  11  . losartan (COZAAR) 25 MG tablet Take 1 tablet (25 mg total) by mouth daily.  30 tablet  11  . Multiple Vitamin (MULTIVITAMIN) capsule Take 1 capsule by mouth daily.        . simvastatin (ZOCOR) 20 MG tablet Take 1 tablet (20 mg total) by mouth at bedtime.  30 tablet  11   No current facility-administered medications on file prior to visit.   Allergies  Allergen Reactions  . Codeine     REACTION: makes sick  . Doxycycline     REACTION: whelps hives  . Iodine     REACTION: breakout  . Penicillins     REACTION: whelps hives   Immunization History  Administered Date(s) Administered  . DTaP 08/28/2008  . Influenza Whole 02/16/2010  . Influenza,inj,Quad PF,36+ Mos 02/13/2013  . Pneumococcal Conjugate 10/24/2009  . Zoster 03/23/2007   Prior to Admission medications   Medication Sig Start Date End Date Taking?  Authorizing Provider  aspirin 81 MG EC tablet Take 81 mg by mouth daily.     Yes Historical Provider, MD  atenolol (TENORMIN) 25 MG tablet Take 1 tablet (25 mg total) by mouth daily. 09/27/12  Yes Ileana Ladd, MD  Calcium 600-200 MG-UNIT per tablet Take 1 tablet by mouth daily.     Yes Historical Provider, MD  diclofenac sodium (VOLTAREN) 1 % GEL Apply 2 g topically 4 (four) times daily.   Yes Historical Provider, MD  fish oil-omega-3 fatty acids 1000 MG capsule Take 2 g by mouth daily.     Yes Historical Provider, MD  levothyroxine (SYNTHROID, LEVOTHROID) 100 MCG tablet Take 1 tablet (100 mcg total) by mouth daily. 08/04/12  Yes Ileana Ladd, MD  losartan (COZAAR) 25 MG tablet Take 1 tablet (25 mg total) by mouth daily. 11/16/12  Yes Ileana Ladd, MD  Multiple Vitamin (MULTIVITAMIN) capsule Take 1 capsule by mouth daily.     Yes  Historical Provider, MD  simvastatin (ZOCOR) 20 MG tablet Take 1 tablet (20 mg total) by mouth at bedtime. 08/04/12  Yes Ileana Ladd, MD     ROS: As above in the HPI. All other systems are stable or negative.  OBJECTIVE: APPEARANCE:  Patient in no acute distress.The patient appeared well nourished and normally developed. Acyanotic. Waist: VITAL SIGNS:BP 148/85  Pulse 66  Temp(Src) 97.6 F (36.4 C) (Oral)  Ht 5' 6.5" (1.689 m)  Wt 159 lb 9.6 oz (72.394 kg)  BMI 25.38 kg/m2  WF  SKIN: warm and  Dry without overt rashes, tattoos and scars  HEAD and Neck: without JVD, Head and scalp: normal Eyes:No scleral icterus. Fundi normal, eye movements normal. Ears: Auricle normal, canal normal, Tympanic membranes normal, insufflation normal. Nose: normal Throat: normal Neck & thyroid: normal  CHEST & LUNGS: Chest wall: normal Lungs: Clear  CVS: Reveals the PMI to be normally located. Regular rhythm, First and Second Heart sounds are normal,  absence of murmurs, rubs or gallops. Peripheral vasculature: Radial pulses: normal Dorsal pedis pulses: normal Posterior pulses: normal  ABDOMEN:  Appearance: normal Benign, no organomegaly, no masses, no Abdominal Aortic enlargement. No Guarding , no rebound. No Bruits. Bowel sounds: normal  RECTAL: N/A GU: N/A  EXTREMETIES: nonedematous.  MUSCULOSKELETAL:  Spine: normal Joints: intact  NEUROLOGIC: oriented to time,place and person; nonfocal. Strength is normal Sensory is normal Reflexes are normal Cranial Nerves are normal.  Results for orders placed in visit on 02/05/13  NMR LIPOPROFILE WITH LIPIDS      Result Value Range   LDL Particle Number 851  <1000 nmol/L   LDL (calc) 55  <100 mg/dL   HDL-C 41  >=16 mg/dL   Triglycerides 94  <109 mg/dL   Cholesterol, Total 604  <200 mg/dL   HDL Particle Number 54.0 (*) >=30.5 umol/L   Large HDL-P 7.2  >=4.8 umol/L   Large VLDL-P 1.4  <=2.7 nmol/L   Small LDL Particle  Number 592 (*) <=527 nmol/L   LDL Size 20.2 (*) >20.5 nm   HDL Size 9.1 (*) >=9.2 nm   VLDL Size 43.6  <=46.6 nm   LP-IR Score 33  <=45  HEPATIC FUNCTION PANEL      Result Value Range   Total Bilirubin 0.7  0.3 - 1.2 mg/dL   Bilirubin, Direct 0.2  0.0 - 0.3 mg/dL   Indirect Bilirubin 0.5  0.0 - 0.9 mg/dL   Alkaline Phosphatase 55  39 - 117 U/L   AST  22  0 - 37 U/L   ALT 23  0 - 35 U/L   Total Protein 6.5  6.0 - 8.3 g/dL   Albumin 4.2  3.5 - 5.2 g/dL  BASIC METABOLIC PANEL WITH GFR      Result Value Range   Sodium 141  135 - 145 mEq/L   Potassium 4.6  3.5 - 5.3 mEq/L   Chloride 105  96 - 112 mEq/L   CO2 28  19 - 32 mEq/L   Glucose, Bld 88  70 - 99 mg/dL   BUN 21  6 - 23 mg/dL   Creat 5.28  4.13 - 2.44 mg/dL   Calcium 9.7  8.4 - 01.0 mg/dL   GFR, Est African American 68     GFR, Est Non African American 59 (*)   THYROID PANEL WITH TSH      Result Value Range   T4, Total 12.4  5.0 - 12.5 ug/dL   T3 Uptake 27.2 (*) 53.6 - 37.0 %   Free Thyroxine Index 5.1 (*) 1.0 - 3.9   TSH 0.395  0.350 - 4.500 uIU/mL  HEMOGLOBIN A1C      Result Value Range   Hemoglobin A1C 5.8 (*) <5.7 %   Mean Plasma Glucose 120 (*) <117 mg/dL    ASSESSMENT: HLD (hyperlipidemia) - Plan: simvastatin (ZOCOR) 10 MG tablet, CMP14+EGFR, NMR, lipoprofile  HTN (hypertension)  Need for prophylactic vaccination and inoculation against influenza  Breast CA, unspecified laterality  Osteopenia  Unspecified hypothyroidism - Plan: TSH  Hyperglycemia - Plan: Hemoglobin A1c   PLAN: Orders Placed This Encounter  Procedures  . CMP14+EGFR    Standing Status: Future     Number of Occurrences:      Standing Expiration Date: 02/13/2014    Order Specific Question:  Has the patient fasted?    Answer:  Yes  . NMR, lipoprofile    Standing Status: Future     Number of Occurrences:      Standing Expiration Date: 02/13/2014  . TSH    Standing Status: Future     Number of Occurrences:      Standing Expiration  Date: 02/13/2014  . Hemoglobin A1c    Standing Status: Future     Number of Occurrences:      Standing Expiration Date: 02/13/2014    Meds ordered this encounter  Medications  . simvastatin (ZOCOR) 10 MG tablet    Sig: Take 1 tablet (10 mg total) by mouth at bedtime.    Dispense:  30 tablet    Refill:  11   Medications Discontinued During This Encounter  Medication Reason  . simvastatin (ZOCOR) 20 MG tablet Reorder        Dr Woodroe Mode Recommendations  For nutrition information, I recommend books:  1).Eat to Live by Dr Monico Hoar. 2).Prevent and Reverse Heart Disease by Dr Suzzette Righter. 3) Dr Katherina Right Book:  Program to Reverse Diabetes  Exercise recommendations are:  If unable to walk, then the patient can exercise in a chair 3 times a day. By flapping arms like a bird gently and raising legs outwards to the front.  If ambulatory, the patient can go for walks for 30 minutes 3 times a week. Then increase the intensity and duration as tolerated.  Goal is to try to attain exercise frequency to 5 times a week.  If applicable: Best to perform resistance exercises (machines or weights) 2 days a week and cardio type exercises 3 days per week.  Discussed  with patient that if she wants to reduce or come of statins and also avoid DM, she should try a plant based diet. We spent 1 hoiur review and explaining every lab test result, examine the patient and counselling on a Plant based Vegan diet. At the end, answered all questions and doubts.  Return in about 6 months (around 08/14/2013) for Recheck medical problems.  Coni Homesley P. [place]esto Charon, M.D.

## 2013-06-13 ENCOUNTER — Telehealth: Payer: Self-pay | Admitting: Family Medicine

## 2013-06-14 DIAGNOSIS — H2510 Age-related nuclear cataract, unspecified eye: Secondary | ICD-10-CM | POA: Diagnosis not present

## 2013-06-14 DIAGNOSIS — H524 Presbyopia: Secondary | ICD-10-CM | POA: Diagnosis not present

## 2013-06-14 DIAGNOSIS — H52229 Regular astigmatism, unspecified eye: Secondary | ICD-10-CM | POA: Diagnosis not present

## 2013-06-14 DIAGNOSIS — H5210 Myopia, unspecified eye: Secondary | ICD-10-CM | POA: Diagnosis not present

## 2013-06-14 NOTE — Telephone Encounter (Signed)
Patient last seen by Dr. Jacelyn Grip in October. Requesting rx be written for breast prosthesis and bras.

## 2013-06-14 NOTE — Telephone Encounter (Signed)
Rx 6 Bras and 1 Breast Prosthesis.

## 2013-08-08 ENCOUNTER — Other Ambulatory Visit (INDEPENDENT_AMBULATORY_CARE_PROVIDER_SITE_OTHER): Payer: Medicare Other

## 2013-08-08 DIAGNOSIS — E039 Hypothyroidism, unspecified: Secondary | ICD-10-CM | POA: Diagnosis not present

## 2013-08-08 DIAGNOSIS — E785 Hyperlipidemia, unspecified: Secondary | ICD-10-CM

## 2013-08-08 DIAGNOSIS — R739 Hyperglycemia, unspecified: Secondary | ICD-10-CM

## 2013-08-08 DIAGNOSIS — R7309 Other abnormal glucose: Secondary | ICD-10-CM | POA: Diagnosis not present

## 2013-08-08 NOTE — Progress Notes (Signed)
Patient came in for labs only.

## 2013-08-10 LAB — NMR, LIPOPROFILE
Cholesterol: 138 mg/dL (ref <200)
HDL Cholesterol by NMR: 46 mg/dL (ref >=40)
HDL Particle Number: 29.1 umol/L — ABNORMAL LOW (ref >=30.5)
LDL Particle Number: 950 nmol/L (ref <1000)
LDL Size: 20.6 nm (ref >20.5)
LDLC SERPL CALC-MCNC: 73 mg/dL (ref <100)
LP-IR Score: 31 (ref <=45)
Small LDL Particle Number: 470 nmol/L (ref <=527)
Triglycerides by NMR: 93 mg/dL (ref <150)

## 2013-08-10 LAB — CMP14+EGFR
ALT: 20 IU/L (ref 0–32)
AST: 19 IU/L (ref 0–40)
Albumin/Globulin Ratio: 2 (ref 1.1–2.5)
Albumin: 4.5 g/dL (ref 3.6–4.8)
Alkaline Phosphatase: 66 IU/L (ref 39–117)
BUN/Creatinine Ratio: 21 (ref 11–26)
BUN: 22 mg/dL (ref 8–27)
CO2: 23 mmol/L (ref 18–29)
Calcium: 9.7 mg/dL (ref 8.7–10.3)
Chloride: 101 mmol/L (ref 97–108)
Creatinine, Ser: 1.04 mg/dL — ABNORMAL HIGH (ref 0.57–1.00)
GFR calc Af Amer: 64 mL/min/{1.73_m2} (ref >59)
GFR calc non Af Amer: 55 mL/min/{1.73_m2} — ABNORMAL LOW (ref >59)
Globulin, Total: 2.3 g/dL (ref 1.5–4.5)
Glucose: 100 mg/dL — ABNORMAL HIGH (ref 65–99)
Potassium: 4.5 mmol/L (ref 3.5–5.2)
Sodium: 140 mmol/L (ref 134–144)
Total Bilirubin: 0.6 mg/dL (ref 0.0–1.2)
Total Protein: 6.8 g/dL (ref 6.0–8.5)

## 2013-08-10 LAB — TSH: TSH: 0.776 u[IU]/mL (ref 0.450–4.500)

## 2013-08-10 LAB — HEMOGLOBIN A1C
Est. average glucose Bld gHb Est-mCnc: 120 mg/dL
Hgb A1c MFr Bld: 5.8 % — ABNORMAL HIGH (ref 4.8–5.6)

## 2013-08-13 ENCOUNTER — Telehealth: Payer: Self-pay | Admitting: Family Medicine

## 2013-08-13 NOTE — Telephone Encounter (Signed)
Message copied by Waverly Ferrari on Mon Aug 13, 2013 11:22 AM ------      Message from: Vernie Shanks      Created: Sun Aug 12, 2013  5:31 PM       Labs results are stable.HGBA1C is  Stable. She is early prediabetic.       No changes in medications.      No change in recommendations.a plant based  Diet and  Exercise will control this.      No change in Follow up ------

## 2013-08-14 ENCOUNTER — Ambulatory Visit (INDEPENDENT_AMBULATORY_CARE_PROVIDER_SITE_OTHER): Payer: Medicare Other | Admitting: Family Medicine

## 2013-08-14 ENCOUNTER — Encounter: Payer: Self-pay | Admitting: Family Medicine

## 2013-08-14 VITALS — BP 130/75 | HR 66 | Temp 97.0°F | Ht 66.5 in | Wt 145.2 lb

## 2013-08-14 DIAGNOSIS — E785 Hyperlipidemia, unspecified: Secondary | ICD-10-CM

## 2013-08-14 DIAGNOSIS — R899 Unspecified abnormal finding in specimens from other organs, systems and tissues: Secondary | ICD-10-CM

## 2013-08-14 DIAGNOSIS — M949 Disorder of cartilage, unspecified: Secondary | ICD-10-CM | POA: Diagnosis not present

## 2013-08-14 DIAGNOSIS — I10 Essential (primary) hypertension: Secondary | ICD-10-CM | POA: Diagnosis not present

## 2013-08-14 DIAGNOSIS — M8580 Other specified disorders of bone density and structure, unspecified site: Secondary | ICD-10-CM

## 2013-08-14 DIAGNOSIS — C50919 Malignant neoplasm of unspecified site of unspecified female breast: Secondary | ICD-10-CM

## 2013-08-14 DIAGNOSIS — E039 Hypothyroidism, unspecified: Secondary | ICD-10-CM

## 2013-08-14 DIAGNOSIS — M899 Disorder of bone, unspecified: Secondary | ICD-10-CM | POA: Diagnosis not present

## 2013-08-14 DIAGNOSIS — R6889 Other general symptoms and signs: Secondary | ICD-10-CM

## 2013-08-14 MED ORDER — LOSARTAN POTASSIUM 25 MG PO TABS: 25.0000 mg | ORAL_TABLET | Freq: Every day | ORAL | Status: DC

## 2013-08-14 MED ORDER — ATENOLOL 25 MG PO TABS: 25.0000 mg | ORAL_TABLET | Freq: Every day | ORAL | Status: DC

## 2013-08-14 MED ORDER — SIMVASTATIN 10 MG PO TABS: 10.0000 mg | ORAL_TABLET | Freq: Every day | ORAL | Status: DC

## 2013-08-14 MED ORDER — LEVOTHYROXINE SODIUM 100 MCG PO TABS: 100.0000 ug | ORAL_TABLET | Freq: Every day | ORAL | Status: DC

## 2013-08-14 NOTE — Patient Instructions (Signed)
KEEP UP THE GREAT WORK!!!!!!!

## 2013-08-14 NOTE — Progress Notes (Signed)
Patient ID: April Weber, female   DOB: 10-28-1945, 68 y.o.   MRN: [id number] SUBJECTIVE: CC: Chief Complaint  Patient presents with  . Follow-up    6 month follow up chronic problems, had labs already   . Medication Refill    needs refills on all meds     HPI: Patient is here for follow up of hyperlipidemia/HTN/Breast cancer/CKD has 1 existing kidney/osteopenia: denies Headache;denies Chest Pain;denies weakness;denies Shortness of Breath and orthopnea;denies Visual changes;denies palpitations;denies cough;denies pedal edema;denies symptoms of TIA or stroke;deniesClaudication symptoms. admits to Compliance with medications; denies Problems with medications.  Dieting / eating very healthy, Keeping active.  Past Medical History  Diagnosis Date  . Osteopenia   . Chronic kidney disease     has one kidney  . Hypertension   . Hyperlipidemia   . Thyroid disease    Past Surgical History  Procedure Laterality Date  . Abdominal hysterectomy    . Thyroid surgery    . Mastectomy Right    History   Social History  . Marital Status: Single    Spouse Name: N/A    Number of Children: N/A  . Years of Education: N/A   Occupational History  .      retired   Social History Main Topics  . Smoking status: Former Smoker    Types: Cigarettes    Quit date: 05/10/1958  . Smokeless tobacco: Not on file  . Alcohol Use: No  . Drug Use: No  . Sexual Activity: Not on file   Other Topics Concern  . Not on file   Social History Narrative  . No narrative on file   Family History  Problem Relation Age of Onset  . Heart attack Mother   . Alcohol abuse Mother    Current Outpatient Prescriptions on File Prior to Visit  Medication Sig Dispense Refill  . aspirin 81 MG EC tablet Take 81 mg by mouth daily.        . Calcium 600-200 MG-UNIT per tablet Take 1 tablet by mouth daily.        . diclofenac sodium (VOLTAREN) 1 % GEL Apply 2 g topically 4 (four) times daily.      . fish  oil-omega-3 fatty acids 1000 MG capsule Take 2 g by mouth daily.        . Multiple Vitamin (MULTIVITAMIN) capsule Take 1 capsule by mouth daily.         No current facility-administered medications on file prior to visit.   Allergies  Allergen Reactions  . Codeine     REACTION: makes sick  . Doxycycline     REACTION: whelps hives  . Iodine     REACTION: breakout  . Penicillins     REACTION: whelps hives   Immunization History  Administered Date(s) Administered  . DTaP 08/28/2008  . Influenza Whole 02/16/2010  . Influenza,inj,Quad PF,36+ Mos 02/13/2013  . Pneumococcal Conjugate-13 10/24/2009  . Zoster 03/23/2007   Prior to Admission medications   Medication Sig Start Date End Date Taking? Authorizing Provider  aspirin 81 MG EC tablet Take 81 mg by mouth daily.      Historical Provider, MD  atenolol (TENORMIN) 25 MG tablet Take 1 tablet (25 mg total) by mouth daily. 09/27/12   Vernie Shanks, MD  Calcium 600-200 MG-UNIT per tablet Take 1 tablet by mouth daily.      Historical Provider, MD  diclofenac sodium (VOLTAREN) 1 % GEL Apply 2 g topically 4 (four) times daily.  Historical Provider, MD  fish oil-omega-3 fatty acids 1000 MG capsule Take 2 g by mouth daily.      Historical Provider, MD  levothyroxine (SYNTHROID, LEVOTHROID) 100 MCG tablet Take 1 tablet (100 mcg total) by mouth daily. 08/04/12   Vernie Shanks, MD  losartan (COZAAR) 25 MG tablet Take 1 tablet (25 mg total) by mouth daily. 11/16/12   Vernie Shanks, MD  Multiple Vitamin (MULTIVITAMIN) capsule Take 1 capsule by mouth daily.      Historical Provider, MD  simvastatin (ZOCOR) 10 MG tablet Take 1 tablet (10 mg total) by mouth at bedtime. 02/13/13   Vernie Shanks, MD     ROS: As above in the HPI. All other systems are stable or negative.  OBJECTIVE: APPEARANCE:  Patient in no acute distress.The patient appeared well nourished and normally developed. Acyanotic. Waist: VITAL SIGNS:BP 130/75  Pulse 66  Temp(Src)  97 F (36.1 C) (Oral)  Ht 5' 6.5" (1.689 m)  Wt 145 lb 3.2 oz (65.862 kg)  BMI 23.09 kg/m2  WF  SKIN: warm and  Dry without overt rashes, tattoos and scars  HEAD and Neck: without JVD, Head and scalp: normal Eyes:No scleral icterus. Fundi normal, eye movements normal. Ears: Auricle normal, canal normal, Tympanic membranes normal, insufflation normal. Nose: normal Throat: normal Neck & thyroid: normal  CHEST & LUNGS: Chest wall: normal Lungs: Clear  CVS: Reveals the PMI to be normally located. Regular rhythm, First and Second Heart sounds are normal,  absence of murmurs, rubs or gallops. Peripheral vasculature: Radial pulses: normal Dorsal pedis pulses: normal Posterior pulses: normal  ABDOMEN:  Appearance: normal Benign, no organomegaly, no masses, no Abdominal Aortic enlargement. No Guarding , no rebound. No Bruits. Bowel sounds: normal  RECTAL: N/A GU: N/A  EXTREMETIES: nonedematous.  MUSCULOSKELETAL:  Spine: normal Joints: intact  NEUROLOGIC: oriented to time,place and person; nonfocal. Strength is normal Sensory is normal Reflexes are normal Cranial Nerves are normal. Results for orders placed in visit on 08/08/13  CMP14+EGFR      Result Value Ref Range   Glucose 100 (*) 65 - 99 mg/dL   BUN 22  8 - 27 mg/dL   Creatinine, Ser 1.04 (*) 0.57 - 1.00 mg/dL   GFR calc non Af Amer 55 (*) >59 mL/min/1.73   GFR calc Af Amer 64  >59 mL/min/1.73   BUN/Creatinine Ratio 21  11 - 26   Sodium 140  134 - 144 mmol/L   Potassium 4.5  3.5 - 5.2 mmol/L   Chloride 101  97 - 108 mmol/L   CO2 23  18 - 29 mmol/L   Calcium 9.7  8.7 - 10.3 mg/dL   Total Protein 6.8  6.0 - 8.5 g/dL   Albumin 4.5  3.6 - 4.8 g/dL   Globulin, Total 2.3  1.5 - 4.5 g/dL   Albumin/Globulin Ratio 2.0  1.1 - 2.5   Total Bilirubin 0.6  0.0 - 1.2 mg/dL   Alkaline Phosphatase 66  39 - 117 IU/L   AST 19  0 - 40 IU/L   ALT 20  0 - 32 IU/L  NMR, LIPOPROFILE      Result Value Ref Range   LDL  Particle Number 950  <1000 nmol/L   LDLC SERPL CALC-MCNC 73  <100 mg/dL   HDL Cholesterol by NMR 46  >=40 mg/dL   Triglycerides by NMR 93  <150 mg/dL   Cholesterol 138  <200 mg/dL   HDL Particle Number 29.1 (*) >=30.5 umol/L  Small LDL Particle Number 470  <=527 nmol/L   LDL Size 20.6  >20.5 nm   LP-IR Score 31  <=45  TSH      Result Value Ref Range   TSH 0.776  0.450 - 4.500 uIU/mL  HEMOGLOBIN A1C      Result Value Ref Range   Hemoglobin A1C 5.8 (*) 4.8 - 5.6 %   Estimated average glucose 120      ASSESSMENT: HTN (hypertension) - Plan: losartan (COZAAR) 25 MG tablet, atenolol (TENORMIN) 25 MG tablet, CMP14+EGFR  HLD (hyperlipidemia) - Plan: simvastatin (ZOCOR) 10 MG tablet, CMP14+EGFR, NMR, lipoprofile  Unspecified hypothyroidism - Plan: levothyroxine (SYNTHROID, LEVOTHROID) 100 MCG tablet, TSH  Osteopenia - Plan: DG Bone Density  Breast CA  Abnormal laboratory test - Plan: POCT glycosylated hemoglobin (Hb A1C), POCT CBC Kiaraliz is doing great.   PLAN: No changes keep active and keep on wih her healthy diet.  Orders Placed This Encounter  Procedures  . DG Bone Density    Standing Status: Future     Number of Occurrences:      Standing Expiration Date: 10/15/2014    Order Specific Question:  Reason for Exam (SYMPTOM  OR DIAGNOSIS REQUIRED)    Answer:  postmenopausal    Order Specific Question:  Preferred imaging location?    Answer:  Internal  . CMP14+EGFR    Standing Status: Future     Number of Occurrences:      Standing Expiration Date: 08/15/2014    Order Specific Question:  Has the patient fasted?    Answer:  Yes  . NMR, lipoprofile    Standing Status: Future     Number of Occurrences:      Standing Expiration Date: 08/15/2014  . TSH    Standing Status: Future     Number of Occurrences:      Standing Expiration Date: 08/15/2014  . POCT glycosylated hemoglobin (Hb A1C)    Standing Status: Future     Number of Occurrences:      Standing Expiration Date:  02/13/2014  . POCT CBC    Standing Status: Future     Number of Occurrences:      Standing Expiration Date: 02/13/2014   Meds ordered this encounter  Medications  . losartan (COZAAR) 25 MG tablet    Sig: Take 1 tablet (25 mg total) by mouth daily.    Dispense:  30 tablet    Refill:  11  . atenolol (TENORMIN) 25 MG tablet    Sig: Take 1 tablet (25 mg total) by mouth daily.    Dispense:  30 tablet    Refill:  11  . simvastatin (ZOCOR) 10 MG tablet    Sig: Take 1 tablet (10 mg total) by mouth at bedtime.    Dispense:  30 tablet    Refill:  11  . levothyroxine (SYNTHROID, LEVOTHROID) 100 MCG tablet    Sig: Take 1 tablet (100 mcg total) by mouth daily.    Dispense:  30 tablet    Refill:  11   Medications Discontinued During This Encounter  Medication Reason  . losartan (COZAAR) 25 MG tablet Reorder  . atenolol (TENORMIN) 25 MG tablet Reorder  . simvastatin (ZOCOR) 10 MG tablet Reorder  . levothyroxine (SYNTHROID, LEVOTHROID) 100 MCG tablet Reorder   Return in about 6 months (around 02/13/2014) for labs in second week of September.  Jerrelle Michelsen P. Jacelyn Grip, M.D.

## 2013-10-06 ENCOUNTER — Encounter: Payer: Self-pay | Admitting: Family Medicine

## 2013-10-06 ENCOUNTER — Ambulatory Visit (INDEPENDENT_AMBULATORY_CARE_PROVIDER_SITE_OTHER): Payer: Medicare Other | Admitting: Family Medicine

## 2013-10-06 VITALS — BP 132/77 | HR 67 | Temp 96.9°F | Ht 66.5 in | Wt 140.0 lb

## 2013-10-06 DIAGNOSIS — A084 Viral intestinal infection, unspecified: Secondary | ICD-10-CM

## 2013-10-06 DIAGNOSIS — A088 Other specified intestinal infections: Secondary | ICD-10-CM | POA: Diagnosis not present

## 2013-10-06 NOTE — Patient Instructions (Signed)
Clear liquids for 24 hours (like 7-Up, ginger ale, Sprite, Jello, frozen pops) Full liquids the second 24-hours (like potato soup, tomato soup, chicken noodle soup) Bland diet the third 24-hours (boiled and baked foods, no fried or greasy foods) Avoid milk, cheese, ice cream and dairy products for 72 hours. Avoid caffeine (cola drinks, coffee, tea, Mountain Dew, Mellow Yellow) Take in small amounts, but frequently. Tylenol and/or Advil as needed for aches pains and fever  Return the stool culture kits and return to clinic Monday for the lab work which has been ordered

## 2013-10-06 NOTE — Progress Notes (Signed)
   Subjective:    Patient ID: April Weber, female    DOB: 08-12-1945, 68 y.o.   MRN: [id number]  HPI Patient complains of diarrhea and bloating x 5 days. Has taken Imodium and eating a BRAT diet.    Review of Systems  Constitutional: Positive for fatigue.  HENT: Negative.   Eyes: Negative.   Respiratory: Negative.   Cardiovascular: Negative.   Gastrointestinal: Positive for nausea and diarrhea. Negative for vomiting and blood in stool.  Endocrine: Negative.   Genitourinary: Negative.   Musculoskeletal: Negative.   Skin: Negative.   Allergic/Immunologic: Negative.   Neurological: Negative.   Hematological: Negative.   Psychiatric/Behavioral: Negative.        Objective:   Physical Exam  Nursing note and vitals reviewed. Constitutional: She is oriented to person, place, and time. She appears well-developed and well-nourished. No distress.  HENT:  Head: Normocephalic and atraumatic.  Nose: Nose normal.  Mouth/Throat: Oropharynx is clear and moist. No oropharyngeal exudate.  Eyes: Conjunctivae and EOM are normal. Pupils are equal, round, and reactive to light. Right eye exhibits no discharge. Left eye exhibits no discharge. No scleral icterus.  Neck: Normal range of motion. Neck supple. No thyromegaly present.  Cardiovascular: Normal rate, regular rhythm, normal heart sounds and intact distal pulses.  Exam reveals no gallop and no friction rub.   No murmur heard. At 72 per minute  Pulmonary/Chest: Effort normal and breath sounds normal. No respiratory distress. She has no wheezes. She has no rales. She exhibits no tenderness.  No axillary nodes  Abdominal: Soft. Bowel sounds are normal. She exhibits no mass. There is no tenderness. There is no rebound and no guarding.  Bowel sounds slightly increased with minimal tenderness No inguinal nodes  Musculoskeletal: Normal range of motion. She exhibits no edema.  Lymphadenopathy:    She has no cervical adenopathy.  Neurological:  She is alert and oriented to person, place, and time.  Skin: Skin is warm and dry. No rash noted.  Psychiatric: She has a normal mood and affect. Her behavior is normal. Judgment and thought content normal.    BP 132/77  Pulse 67  Temp(Src) 96.9 F (36.1 C) (Oral)  Ht 5' 6.5" (1.689 m)  Wt 140 lb (63.504 kg)  BMI 22.26 kg/m2       Assessment & Plan:  1. Viral gastroenteritis - POCT CBC; Future - BMP8+EGFR; Future - Hepatic function panel; Future  Patient Instructions  Clear liquids for 24 hours (like 7-Up, ginger ale, Sprite, Jello, frozen pops) Full liquids the second 24-hours (like potato soup, tomato soup, chicken noodle soup) Bland diet the third 24-hours (boiled and baked foods, no fried or greasy foods) Avoid milk, cheese, ice cream and dairy products for 72 hours. Avoid caffeine (cola drinks, coffee, tea, Mountain Dew, Mellow Yellow) Take in small amounts, but frequently. Tylenol and/or Advil as needed for aches pains and fever  Return the stool culture kits and return to clinic Monday for the lab work which has been ordered   Arrie Senate MD

## 2013-10-08 ENCOUNTER — Other Ambulatory Visit (INDEPENDENT_AMBULATORY_CARE_PROVIDER_SITE_OTHER): Payer: Medicare Other

## 2013-10-08 DIAGNOSIS — E039 Hypothyroidism, unspecified: Secondary | ICD-10-CM

## 2013-10-08 DIAGNOSIS — R6889 Other general symptoms and signs: Secondary | ICD-10-CM | POA: Diagnosis not present

## 2013-10-08 DIAGNOSIS — R7309 Other abnormal glucose: Secondary | ICD-10-CM

## 2013-10-08 DIAGNOSIS — E785 Hyperlipidemia, unspecified: Secondary | ICD-10-CM | POA: Diagnosis not present

## 2013-10-08 DIAGNOSIS — I10 Essential (primary) hypertension: Secondary | ICD-10-CM | POA: Diagnosis not present

## 2013-10-08 DIAGNOSIS — R899 Unspecified abnormal finding in specimens from other organs, systems and tissues: Secondary | ICD-10-CM

## 2013-10-08 LAB — POCT CBC
Granulocyte percent: 74.1 %G (ref 37–80)
HCT, POC: 42 % (ref 37.7–47.9)
Hemoglobin: 14.1 g/dL (ref 12.2–16.2)
Lymph, poc: 1 (ref 0.6–3.4)
MCH, POC: 30.4 pg (ref 27–31.2)
MCHC: 33.6 g/dL (ref 31.8–35.4)
MCV: 90.5 fL (ref 80–97)
MPV: 8.7 fL (ref 0–99.8)
POC Granulocyte: 3.7 (ref 2–6.9)
POC LYMPH PERCENT: 20.7 %L (ref 10–50)
Platelet Count, POC: 188 10*3/uL (ref 142–424)
RBC: 4.6 M/uL (ref 4.04–5.48)
RDW, POC: 12.4 %
WBC: 5 10*3/uL (ref 4.6–10.2)

## 2013-10-08 LAB — POCT GLYCOSYLATED HEMOGLOBIN (HGB A1C): Hemoglobin A1C: 5.3

## 2013-10-08 NOTE — Progress Notes (Signed)
Pt came in for labs only 

## 2013-10-09 LAB — NMR, LIPOPROFILE
Cholesterol: 111 mg/dL (ref 100–199)
HDL Cholesterol by NMR: 47 mg/dL (ref >39)
HDL Particle Number: 28 umol/L — ABNORMAL LOW (ref >=30.5)
LDL Particle Number: 523 nmol/L (ref <1000)
LDL Size: 20.7 nm (ref >20.5)
LDLC SERPL CALC-MCNC: 46 mg/dL (ref 0–99)
LP-IR Score: <25 (ref <=45)
Small LDL Particle Number: 255 nmol/L (ref <=527)
Triglycerides by NMR: 92 mg/dL (ref 0–149)

## 2013-10-09 LAB — CMP14+EGFR
ALT: 19 IU/L (ref 0–32)
AST: 19 IU/L (ref 0–40)
Albumin/Globulin Ratio: 2.2 (ref 1.1–2.5)
Albumin: 4.1 g/dL (ref 3.6–4.8)
Alkaline Phosphatase: 58 IU/L (ref 39–117)
BUN/Creatinine Ratio: 14 (ref 11–26)
BUN: 12 mg/dL (ref 8–27)
CO2: 24 mmol/L (ref 18–29)
Calcium: 9 mg/dL (ref 8.7–10.3)
Chloride: 105 mmol/L (ref 97–108)
Creatinine, Ser: 0.88 mg/dL (ref 0.57–1.00)
GFR calc Af Amer: 78 mL/min/{1.73_m2} (ref >59)
GFR calc non Af Amer: 68 mL/min/{1.73_m2} (ref >59)
Globulin, Total: 1.9 g/dL (ref 1.5–4.5)
Glucose: 76 mg/dL (ref 65–99)
Potassium: 4.2 mmol/L (ref 3.5–5.2)
Sodium: 142 mmol/L (ref 134–144)
Total Bilirubin: 0.6 mg/dL (ref 0.0–1.2)
Total Protein: 6 g/dL (ref 6.0–8.5)

## 2013-10-09 LAB — TSH: TSH: 1.46 u[IU]/mL (ref 0.450–4.500)

## 2013-10-15 ENCOUNTER — Telehealth: Payer: Self-pay | Admitting: Family Medicine

## 2013-10-15 NOTE — Telephone Encounter (Signed)
All of the patient's lab work including a CBC kidney function liver function thyroid function were excellent her cholesterol numbers were also good there was no indication of being dehydrated------ make sure that she is feeling better and getting her energy back

## 2013-10-15 NOTE — Telephone Encounter (Signed)
Please review labs per patient she said that you had ordered them

## 2013-10-15 NOTE — Telephone Encounter (Signed)
Patient aware.

## 2013-10-17 ENCOUNTER — Encounter: Payer: Self-pay | Admitting: Pharmacist

## 2013-10-17 ENCOUNTER — Ambulatory Visit (INDEPENDENT_AMBULATORY_CARE_PROVIDER_SITE_OTHER): Payer: Medicare Other | Admitting: Pharmacist

## 2013-10-17 ENCOUNTER — Ambulatory Visit (INDEPENDENT_AMBULATORY_CARE_PROVIDER_SITE_OTHER): Payer: Medicare Other

## 2013-10-17 VITALS — Ht 66.5 in | Wt 141.0 lb

## 2013-10-17 DIAGNOSIS — M899 Disorder of bone, unspecified: Secondary | ICD-10-CM

## 2013-10-17 DIAGNOSIS — M949 Disorder of cartilage, unspecified: Secondary | ICD-10-CM

## 2013-10-17 DIAGNOSIS — M8580 Other specified disorders of bone density and structure, unspecified site: Secondary | ICD-10-CM

## 2013-10-17 LAB — HM DEXA SCAN

## 2013-10-17 NOTE — Patient Instructions (Signed)
Fall Prevention and Home Safety Falls cause injuries and can affect all age groups. It is possible to use preventive measures to significantly decrease the likelihood of falls. There are many simple measures which can make your home safer and prevent falls. OUTDOORS  Repair cracks and edges of walkways and driveways.  Remove high doorway thresholds.  Trim shrubbery on the main path into your home.  Have good outside lighting.  Clear walkways of tools, rocks, debris, and clutter.  Check that handrails are not broken and are securely fastened. Both sides of steps should have handrails.  Have leaves, snow, and ice cleared regularly.  Use sand or salt on walkways during winter months.  In the garage, clean up grease or oil spills. BATHROOM  Install night lights.  Install grab bars by the toilet and in the tub and shower.  Use non-skid mats or decals in the tub or shower.  Place a plastic non-slip stool in the shower to sit on, if needed.  Keep floors dry and clean up all water on the floor immediately.  Remove soap buildup in the tub or shower on a regular basis.  Secure bath mats with non-slip, double-sided rug tape.  Remove throw rugs and tripping hazards from the floors. BEDROOMS  Install night lights.  Make sure a bedside light is easy to reach.  Do not use oversized bedding.  Keep a telephone by your bedside.  Have a firm chair with side arms to use for getting dressed.  Remove throw rugs and tripping hazards from the floor. KITCHEN  Keep handles on pots and pans turned toward the center of the stove. Use back burners when possible.  Clean up spills quickly and allow time for drying.  Avoid walking on wet floors.  Avoid hot utensils and knives.  Position shelves so they are not too high or low.  Place commonly used objects within easy reach.  If necessary, use a sturdy step stool with a grab bar when reaching.  Keep electrical cables out of the  way.  Do not use floor polish or wax that makes floors slippery. If you must use wax, use non-skid floor wax.  Remove throw rugs and tripping hazards from the floor. STAIRWAYS  Never leave objects on stairs.  Place handrails on both sides of stairways and use them. Fix any loose handrails. Make sure handrails on both sides of the stairways are as long as the stairs.  Check carpeting to make sure it is firmly attached along stairs. Make repairs to worn or loose carpet promptly.  Avoid placing throw rugs at the top or bottom of stairways, or properly secure the rug with carpet tape to prevent slippage. Get rid of throw rugs, if possible.  Have an electrician put in a light switch at the top and bottom of the stairs. OTHER FALL PREVENTION TIPS  Wear low-heel or rubber-soled shoes that are supportive and fit well. Wear closed toe shoes.  When using a stepladder, make sure it is fully opened and both spreaders are firmly locked. Do not climb a closed stepladder.  Add color or contrast paint or tape to grab bars and handrails in your home. Place contrasting color strips on first and last steps.  Learn and use mobility aids as needed. Install an electrical emergency response system.  Turn on lights to avoid dark areas. Replace light bulbs that burn out immediately. Get light switches that glow.  Arrange furniture to create clear pathways. Keep furniture in the same place.  Firmly attach carpet with non-skid or double-sided tape.  Eliminate uneven floor surfaces.  Select a carpet pattern that does not visually hide the edge of steps.  Be aware of all pets. OTHER HOME SAFETY TIPS  Set the water temperature for 120 F (48.8 C).  Keep emergency numbers on or near the telephone.  Keep smoke detectors on every level of the home and near sleeping areas. Document Released: 04/16/2002 Document Revised: 10/26/2011 Document Reviewed: 07/16/2011 [facility] Patient Information 2014  [place].               Exercise for Strong Bones  Exercise is important to build and maintain strong bones / bone density.  There are 2 types of exercises that are important to building and maintaining strong bones:  Weight- bearing and muscle-stregthening.  Weight-bearing Exercises  These exercises include activities that make you move against gravity while staying upright. Weight-bearing exercises can be high-impact or low-impact.  High-impact weight-bearing exercises help build bones and keep them strong. If you have broken a bone due to osteoporosis or are at risk of breaking a bone, you may need to avoid high-impact exercises. If you're not sure, you should check with your healthcare provider.  Examples of high-impact weight-bearing exercises are: Dancing  Doing high-impact aerobics  Hiking  Jogging/running  Jumping Rope  Stair climbing  Tennis  Low-impact weight-bearing exercises can also help keep bones strong and are a safe alternative if you cannot do high-impact exercises.   Examples of low-impact weight-bearing exercises are: Using elliptical training machines  Doing low-impact aerobics  Using stair-step machines  Fast walking on a treadmill or outside   Muscle-Strengthening Exercises These exercises include activities where you move your body, a weight or some other resistance against gravity. They are also known as resistance exercises and include: Lifting weights  Using elastic exercise bands  Using weight machines  Lifting your own body weight  Functional movements, such as standing and rising up on your toes  Yoga and Pilates can also improve strength, balance and flexibility. However, certain positions may not be safe for people with osteoporosis or those at increased risk of broken bones. For example, exercises that have you bend forward may increase the chance of breaking a bone in the spine.   Non-Impact Exercises There are other types of exercises  that can help prevent falls.  Non-impact exercises can help you to improve balance, posture and how well you move in everyday activities. Some of these exercises include: Balance exercises that strengthen your legs and test your balance, such as Tai Chi, can decrease your risk of falls.  Posture exercises that improve your posture and reduce rounded or "sloping" shoulders can help you decrease the chance of breaking a bone, especially in the spine.  Functional exercises that improve how well you move can help you with everyday activities and decrease your chance of falling and breaking a bone. For example, if you have trouble getting up from a chair or climbing stairs, you should do these activities as exercises.   **A physical therapist can teach you balance, posture and functional exercises. He/she can also help you learn which exercises are safe and appropriate for you.  [facility] has a physical therapy office in [place] in front of our office and referrals can be made for assessments and treatment as needed and strength and balance training.  If you would like to have an assessment with [facility] and our physical therapy team please let a nurse or  provider know.

## 2013-10-17 NOTE — Progress Notes (Signed)
Patient ID: April Weber, female   DOB: January 11, 1946, 68 y.o.   MRN: [id number]  [facility] Current Height: Height: 5' 6.5" (168.9 cm)       Max Lifetime Height:  5\' 7"  Current Weight: Weight: 141 lb (63.957 kg)       Ethnicity:Caucasian    HPI: Does pt already have a diagnosis of:  Osteopenia?  Yes Osteoporosis?  No  Back Pain?  No       Kyphosis?  No Prior fracture?  Yes - toe Med(s) for Osteoporosis/Osteopenia:  none Med(s) previously tried for Osteoporosis/Osteopenia:  none                                                             PMH: Age at menopause:  Surgical at 68 yo Hysterectomy?  Yes Oophorectomy?  Removed 1 ovary - 1 ovary remains HRT? No Steroid Use?  No Thyroid med?  Yes History of cancer?  Yes - breast cancer in 1984 - mastectomy History of digestive disorders (ie Crohn's)?  No Current or previous eating disorders?  No Last Vitamin D Result:  59 (07/27/2012) Last GFR Result:  68 (10/08/2013)   FH/SH: Family history of osteoporosis?  No Parent with history of hip fracture?  No Family history of breast cancer?  Yes - maternal aunt Exercise?  Yes  - lifts weights 2 time per week and walks Smoking?  No Alcohol?  No    Calcium Assessment Calcium Intake  # of servings/day  Calcium mg  Milk (8 oz) 0  x  300  = 0  Yogurt (4 oz) 1 x  200 = 200mg   Cheese (1 oz) 0 x  200 = 0  Other Calcium sources   250mg   Ca supplement 500mg  BID and MVI = 1400mg    Estimated calcium intake per day 1850mg     DEXA Results Date of Test T-Score for AP Spine L1-L4 T-Score for Total Left Hip T-Score for Total Right Hip  10/17/2013 -0.5 -1.1 -1.1  12/31/2009 -0.4 -0.7 -0.7  12/20/2007 -0.6 -0.6 -0.7        FRAX 10 year estimate: (counting broken toe as RF) Total FX risk:  14%  (consider medication if >/= 20%) Hip FX risk:  1.5%  (consider medication if >/= 3%)  Assessment: Osteopenia   Recommendations: 1.  Discussed BMD results and fracture risk 2.  recommend  calcium 1200mg  daily through supplementation or diet.  3.  continue weight bearing exercise - 30 minutes at least 4 days per week.   4.  Counseled and educated about fall risk and prevention.  Recheck DEXA:  2 years  Time spent counseling patient:  20 minutes Cherre Robins, PharmD, CPP

## 2013-12-25 ENCOUNTER — Other Ambulatory Visit: Payer: Self-pay | Admitting: Family Medicine

## 2013-12-25 DIAGNOSIS — Z1231 Encounter for screening mammogram for malignant neoplasm of breast: Secondary | ICD-10-CM

## 2014-01-23 ENCOUNTER — Other Ambulatory Visit (INDEPENDENT_AMBULATORY_CARE_PROVIDER_SITE_OTHER): Payer: Medicare Other

## 2014-01-23 ENCOUNTER — Encounter (INDEPENDENT_AMBULATORY_CARE_PROVIDER_SITE_OTHER): Payer: Self-pay

## 2014-01-23 DIAGNOSIS — E785 Hyperlipidemia, unspecified: Secondary | ICD-10-CM | POA: Diagnosis not present

## 2014-01-23 DIAGNOSIS — M949 Disorder of cartilage, unspecified: Secondary | ICD-10-CM

## 2014-01-23 DIAGNOSIS — M899 Disorder of bone, unspecified: Secondary | ICD-10-CM

## 2014-01-23 DIAGNOSIS — I10 Essential (primary) hypertension: Secondary | ICD-10-CM | POA: Diagnosis not present

## 2014-01-23 DIAGNOSIS — E559 Vitamin D deficiency, unspecified: Secondary | ICD-10-CM | POA: Diagnosis not present

## 2014-01-23 DIAGNOSIS — E039 Hypothyroidism, unspecified: Secondary | ICD-10-CM | POA: Diagnosis not present

## 2014-01-23 DIAGNOSIS — M8580 Other specified disorders of bone density and structure, unspecified site: Secondary | ICD-10-CM

## 2014-01-23 LAB — POCT CBC
Granulocyte percent: 77.1 %G (ref 37–80)
HCT, POC: 44.6 % (ref 37.7–47.9)
HEMOGLOBIN: 14.9 g/dL (ref 12.2–16.2)
LYMPH, POC: 1.2 (ref 0.6–3.4)
MCH: 30.5 pg (ref 27–31.2)
MCHC: 33.4 g/dL (ref 31.8–35.4)
MCV: 91.3 fL (ref 80–97)
MPV: 9 fL (ref 0–99.8)
POC Granulocyte: 4.2 (ref 2–6.9)
POC LYMPH PERCENT: 21.2 %L (ref 10–50)
Platelet Count, POC: 205 10*3/uL (ref 142–424)
RBC: 4.9 M/uL (ref 4.04–5.48)
RDW, POC: 12.6 %
WBC: 5.5 10*3/uL (ref 4.6–10.2)

## 2014-01-23 NOTE — Progress Notes (Signed)
Lab only 

## 2014-01-24 LAB — HEPATIC FUNCTION PANEL
ALBUMIN: 4.6 g/dL (ref 3.6–4.8)
ALT: 21 IU/L (ref 0–32)
AST: 22 IU/L (ref 0–40)
Alkaline Phosphatase: 58 IU/L (ref 39–117)
BILIRUBIN TOTAL: 0.6 mg/dL (ref 0.0–1.2)
Bilirubin, Direct: 0.14 mg/dL (ref 0.00–0.40)
TOTAL PROTEIN: 6.7 g/dL (ref 6.0–8.5)

## 2014-01-24 LAB — BMP8+EGFR
BUN/Creatinine Ratio: 22 (ref 11–26)
BUN: 23 mg/dL (ref 8–27)
CO2: 25 mmol/L (ref 18–29)
Calcium: 10 mg/dL (ref 8.7–10.3)
Chloride: 102 mmol/L (ref 97–108)
Creatinine, Ser: 1.06 mg/dL — ABNORMAL HIGH (ref 0.57–1.00)
GFR calc Af Amer: 62 mL/min/{1.73_m2} (ref >59)
GFR calc non Af Amer: 54 mL/min/{1.73_m2} — ABNORMAL LOW (ref >59)
GLUCOSE: 94 mg/dL (ref 65–99)
Potassium: 4.5 mmol/L (ref 3.5–5.2)
Sodium: 142 mmol/L (ref 134–144)

## 2014-01-24 LAB — LIPID PANEL
Chol/HDL Ratio: 2.7 ratio units (ref 0.0–4.4)
Cholesterol, Total: 149 mg/dL (ref 100–199)
HDL: 55 mg/dL (ref >39)
LDL Calculated: 73 mg/dL (ref 0–99)
TRIGLYCERIDES: 104 mg/dL (ref 0–149)
VLDL Cholesterol Cal: 21 mg/dL (ref 5–40)

## 2014-01-24 LAB — VITAMIN D 25 HYDROXY (VIT D DEFICIENCY, FRACTURES): Vit D, 25-Hydroxy: 52 ng/mL (ref 30.0–100.0)

## 2014-01-24 LAB — THYROID PANEL WITH TSH
Free Thyroxine Index: 3.5 (ref 1.2–4.9)
T3 Uptake Ratio: 31 % (ref 24–39)
T4, Total: 11.3 ug/dL (ref 4.5–12.0)
TSH: 1.44 u[IU]/mL (ref 0.450–4.500)

## 2014-02-11 ENCOUNTER — Ambulatory Visit (HOSPITAL_COMMUNITY)
Admission: RE | Admit: 2014-02-11 | Discharge: 2014-02-11 | Disposition: A | Discharge status: Home / Self Care | Payer: Medicare Other | Source: Ambulatory Visit | Attending: Family Medicine | Admitting: Family Medicine

## 2014-02-11 DIAGNOSIS — Z1231 Encounter for screening mammogram for malignant neoplasm of breast: Secondary | ICD-10-CM | POA: Diagnosis not present

## 2014-02-11 IMAGING — MG MM SCREENING BREAST TOMO UNI L
5 series · 6 of 13 positions shown · non-contrast
Comparison: Previous exam(s).

CLINICAL DATA: Screening.

EXAM:
DIGITAL SCREENING UNILATERAL LEFT MAMMOGRAM WITH TOMO AND CAD

[L MLO (1 of 2)]
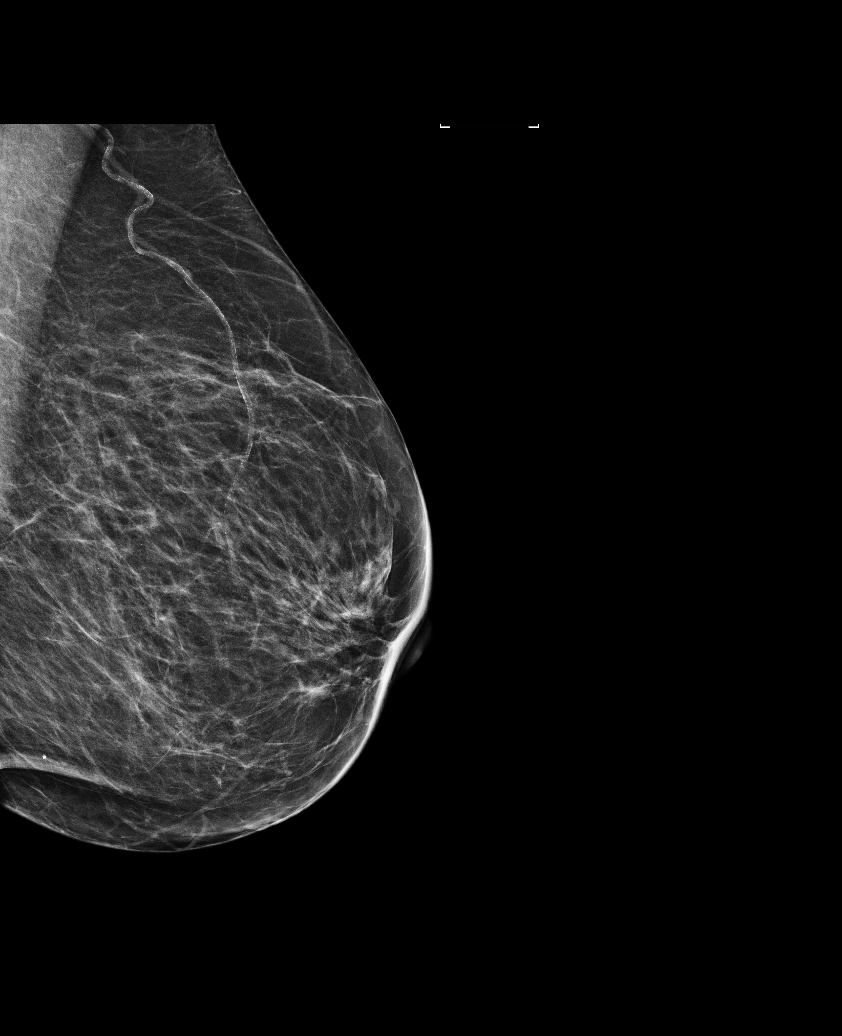

[L MLO (2 of 2)]
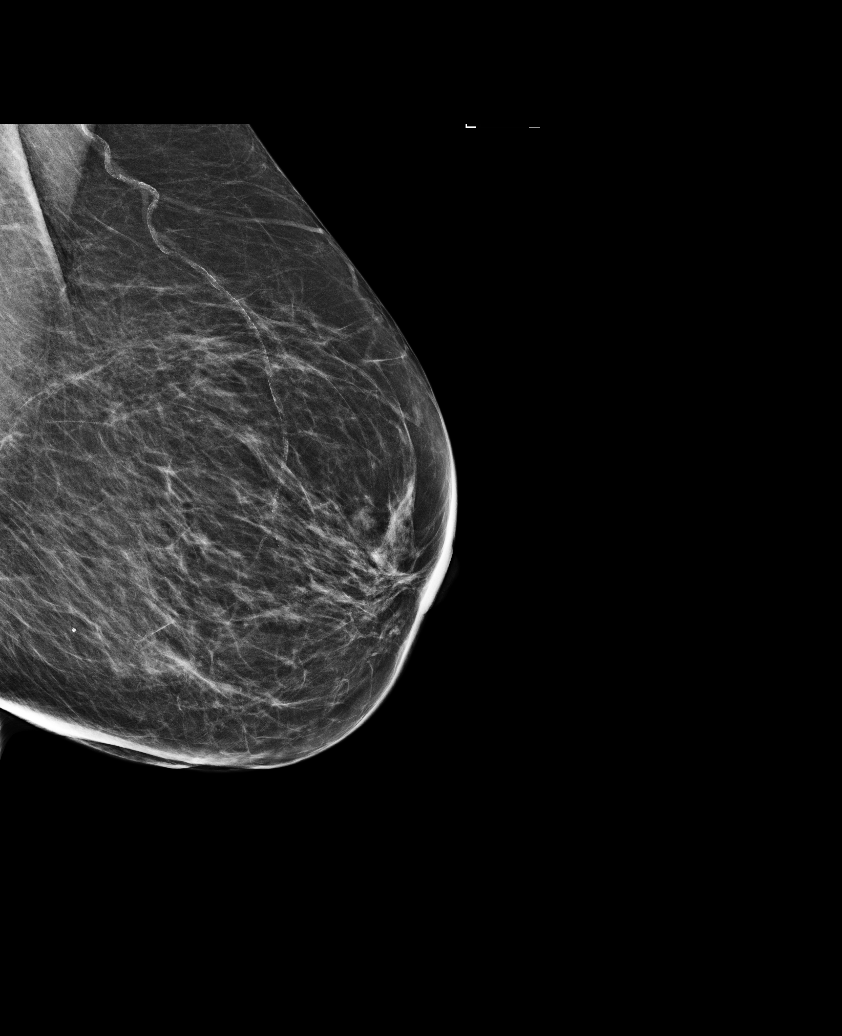

[L CC]
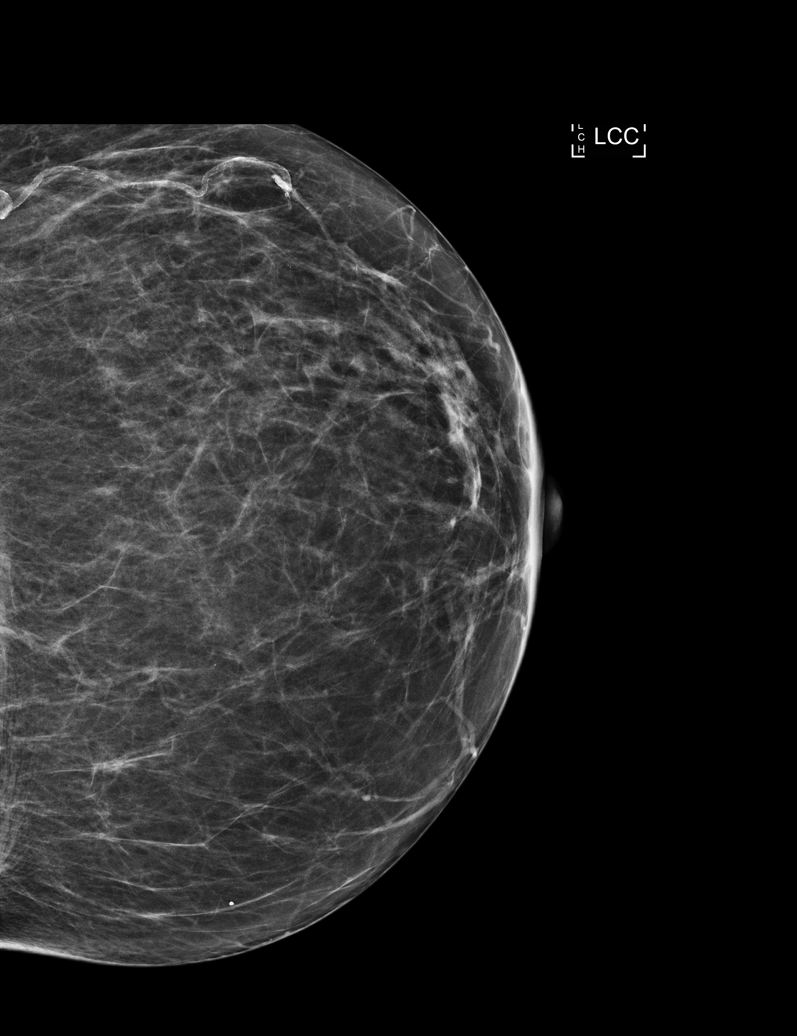

[L MLO tomo · 2 of 72 frames shown]
[frame 24/72]
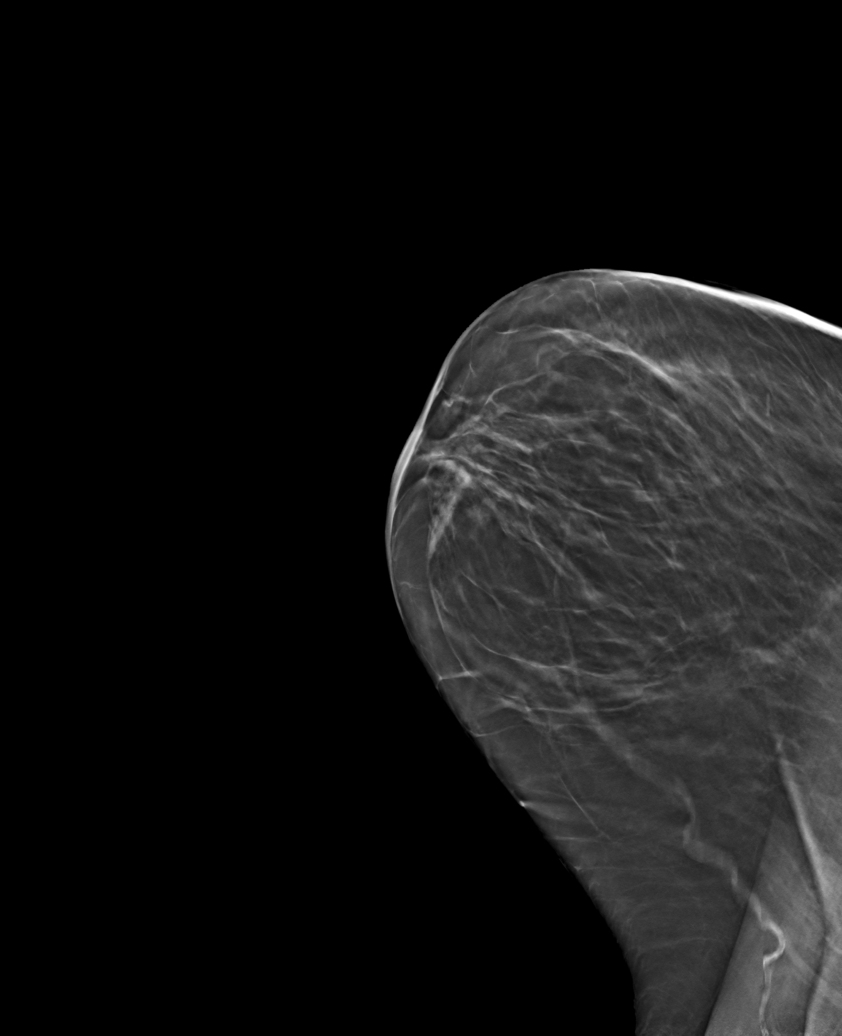
[frame 37/72]
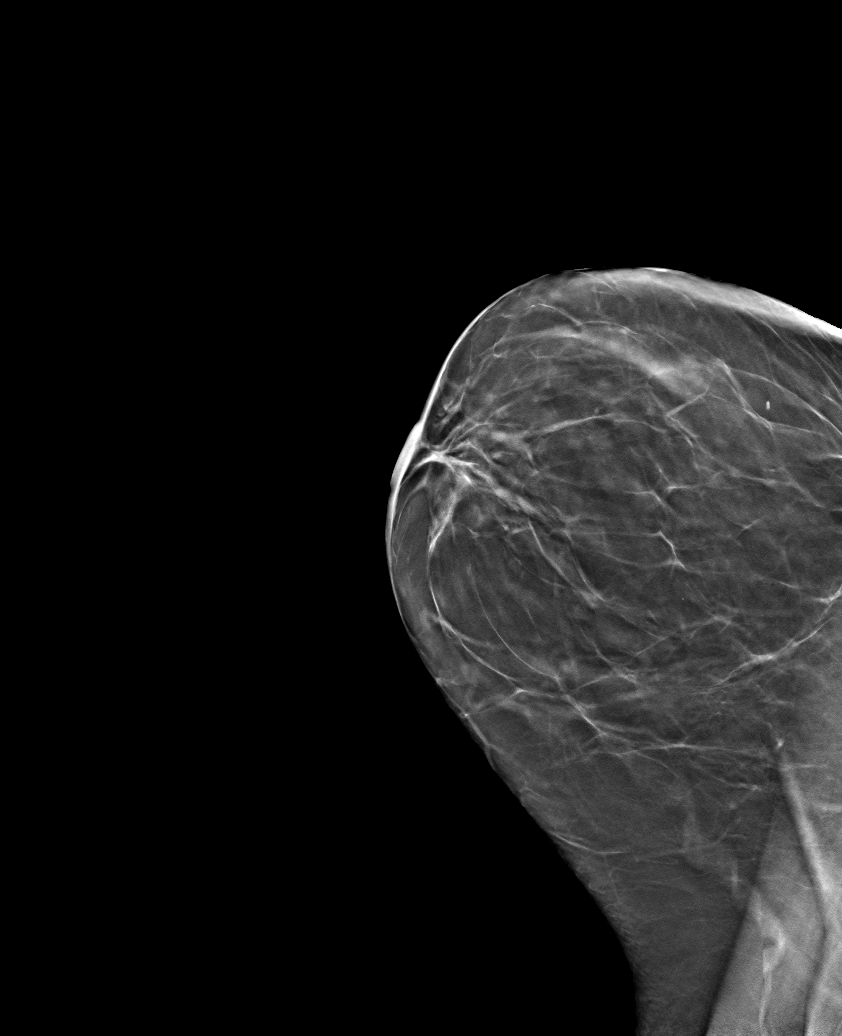

[L CC tomo · tomo slice 31/61.0]
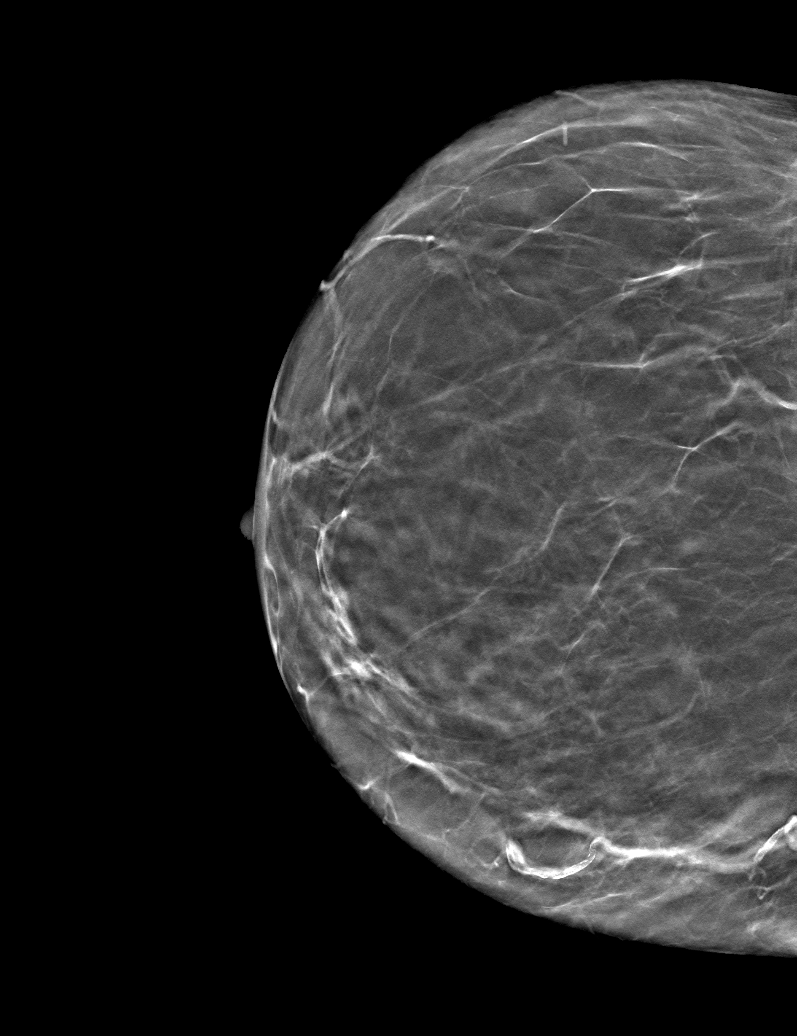

[6 of 13 positions shown; findings below may reference images not displayed]

ACR Breast Density Category b: There are scattered areas of
fibroglandular density.
FINDINGS: There are no findings suspicious for malignancy. Images were
processed with CAD.
IMPRESSION: No mammographic evidence of malignancy. A result letter of this
screening mammogram will be mailed directly to the patient.

RECOMMENDATION:
Screening mammogram in one year. (Code:[H5])

BI-RADS CATEGORY  1: Negative.

## 2014-02-13 ENCOUNTER — Encounter: Payer: Self-pay | Admitting: Family Medicine

## 2014-02-13 ENCOUNTER — Ambulatory Visit (INDEPENDENT_AMBULATORY_CARE_PROVIDER_SITE_OTHER): Payer: Medicare Other | Admitting: Family Medicine

## 2014-02-13 VITALS — BP 155/79 | HR 66 | Temp 97.3°F | Ht 66.5 in | Wt 140.0 lb

## 2014-02-13 DIAGNOSIS — I10 Essential (primary) hypertension: Secondary | ICD-10-CM

## 2014-02-13 DIAGNOSIS — E785 Hyperlipidemia, unspecified: Secondary | ICD-10-CM

## 2014-02-13 DIAGNOSIS — E890 Postprocedural hypothyroidism: Secondary | ICD-10-CM | POA: Diagnosis not present

## 2014-02-13 DIAGNOSIS — Z23 Encounter for immunization: Secondary | ICD-10-CM | POA: Diagnosis not present

## 2014-02-13 NOTE — Progress Notes (Signed)
   Subjective:    Patient ID: April Weber, female    DOB: 1945/08/03, 68 y.o.   MRN: [id number]  HPI 68 year old female here to followup hypothyroid condition, hypertension, and hyperlipidemia. She had lab work done several weeks ago and we reviewed those today. Everything is totally normal and at goal. She has no other specific complaints or problems she has lost some 40 pounds weight watching her carbs and dieting. Apparently this has improved her lipids blood sugar tremendously.    Review of Systems  Constitutional: Negative.   HENT: Negative.   Eyes: Negative.   Respiratory: Negative.   Cardiovascular: Negative.   Gastrointestinal: Negative.   Endocrine: Negative.   Genitourinary: Negative.   Musculoskeletal: Positive for myalgias.       Bilateral thumb pain  Skin: Rash: not true rash but healing intertrigo.  Hematological: Negative.   Psychiatric/Behavioral: Negative.        Objective:   Physical Exam  Constitutional: She is oriented to person, place, and time. She appears well-developed and well-nourished.  Eyes: Conjunctivae and EOM are normal.  Neck: Normal range of motion. Neck supple.  Cardiovascular: Normal rate, regular rhythm and normal heart sounds.   Pulmonary/Chest: Effort normal and breath sounds normal.  Abdominal: Soft. Bowel sounds are normal.  Musculoskeletal: Normal range of motion.  Neurological: She is alert and oriented to person, place, and time. She has normal reflexes.  Skin: Skin is warm and dry.  Psychiatric: She has a normal mood and affect. Her behavior is normal. Thought content normal.    BP 155/79  Pulse 66  Temp(Src) 97.3 F (36.3 C) (Oral)  Ht 5' 6.5" (1.689 m)  Wt 140 lb (63.504 kg)  BMI 22.26 kg/m2      Assessment & Plan:  1. Essential hypertension   2. Hyperlipidemia   3. Postoperative hypothyroidism Wardell Honour MD

## 2014-05-14 DIAGNOSIS — I83813 Varicose veins of bilateral lower extremities with pain: Secondary | ICD-10-CM | POA: Diagnosis not present

## 2014-05-20 DIAGNOSIS — I8312 Varicose veins of left lower extremity with inflammation: Secondary | ICD-10-CM | POA: Diagnosis not present

## 2014-05-20 DIAGNOSIS — I8311 Varicose veins of right lower extremity with inflammation: Secondary | ICD-10-CM | POA: Diagnosis not present

## 2014-07-11 ENCOUNTER — Other Ambulatory Visit: Payer: Self-pay | Admitting: *Deleted

## 2014-07-11 DIAGNOSIS — I10 Essential (primary) hypertension: Secondary | ICD-10-CM

## 2014-07-11 DIAGNOSIS — E785 Hyperlipidemia, unspecified: Secondary | ICD-10-CM

## 2014-07-11 DIAGNOSIS — E890 Postprocedural hypothyroidism: Secondary | ICD-10-CM

## 2014-07-11 MED ORDER — LOSARTAN POTASSIUM 25 MG PO TABS: 25.0000 mg | ORAL_TABLET | Freq: Every day | ORAL | Status: DC

## 2014-07-11 MED ORDER — ATENOLOL 25 MG PO TABS: 25.0000 mg | ORAL_TABLET | Freq: Every day | ORAL | Status: DC

## 2014-07-11 MED ORDER — SIMVASTATIN 10 MG PO TABS: 10.0000 mg | ORAL_TABLET | Freq: Every day | ORAL | Status: DC

## 2014-07-11 MED ORDER — LEVOTHYROXINE SODIUM 100 MCG PO TABS: 100.0000 ug | ORAL_TABLET | Freq: Every day | ORAL | Status: DC

## 2014-07-11 NOTE — Addendum Note (Signed)
Addended by: Thana Ates on: 07/11/2014 07:56 PM   Modules accepted: Orders

## 2014-07-11 NOTE — Addendum Note (Signed)
Addended by: Thana Ates on: 07/11/2014 08:04 PM   Modules accepted: Orders

## 2014-07-23 ENCOUNTER — Encounter: Payer: Self-pay | Admitting: Podiatry

## 2014-07-23 ENCOUNTER — Ambulatory Visit (INDEPENDENT_AMBULATORY_CARE_PROVIDER_SITE_OTHER): Payer: Medicare Other | Admitting: Podiatry

## 2014-07-23 ENCOUNTER — Ambulatory Visit (INDEPENDENT_AMBULATORY_CARE_PROVIDER_SITE_OTHER): Payer: Medicare Other

## 2014-07-23 VITALS — BP 145/84 | HR 61 | Resp 16

## 2014-07-23 DIAGNOSIS — M79672 Pain in left foot: Secondary | ICD-10-CM | POA: Diagnosis not present

## 2014-07-23 DIAGNOSIS — Q828 Other specified congenital malformations of skin: Secondary | ICD-10-CM | POA: Diagnosis not present

## 2014-07-23 DIAGNOSIS — M216X9 Other acquired deformities of unspecified foot: Secondary | ICD-10-CM

## 2014-07-23 DIAGNOSIS — M79671 Pain in right foot: Secondary | ICD-10-CM | POA: Diagnosis not present

## 2014-07-23 DIAGNOSIS — M722 Plantar fascial fibromatosis: Secondary | ICD-10-CM | POA: Diagnosis not present

## 2014-07-23 NOTE — Progress Notes (Signed)
   Subjective:    Patient ID: April Weber, female    DOB: February 21, 1946, 69 y.o.   MRN: [id number]  HPI Comments: "I have a pressure point on the bottom"  Patient c/o tender sub 4th MPJ bilateral, left over right, for several months. Callused on the left. She wears orthotics,but may need new ones. Her daughter is a PA and said she may need some offloading orthotic.   Also, ingrown toenail 1st toe right, medial border.     Review of Systems  All other systems reviewed and are negative.      Objective:   Physical Exam: I have reviewed her past medical history medications allergies surgery social history and review of systems area pulses are strongly palpable bilaterally. Neurologic is intact per Semmes-Weinstein monofilament. The tendon reflexes are intact bilaterally muscle strength +5 over 5 dorsiflexion plantar flexors and inverters and everters all intrinsic musculature appears to be intact. Orthopedic evaluation of the bilateral foot demonstrates all joints distal to the ankle in full range of motion without crepitation. She does have fat pad atrophy with prominent fourth metatarsal bilateral right greater than left. This is resulting and reactive hyperkeratosis but without ulceration or skin breakdown. No signs of infection. Radiographic evaluation confirms relatively normal osseous architecture. Mild hammertoe deformities are noted.        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 History of plantar fasciitis.  Plan: She will scan today for set of orthotics we also performed debridement of reactive hyperkeratosis.

## 2014-08-12 ENCOUNTER — Telehealth: Payer: Self-pay | Admitting: Family Medicine

## 2014-08-12 DIAGNOSIS — E890 Postprocedural hypothyroidism: Secondary | ICD-10-CM

## 2014-08-12 DIAGNOSIS — E785 Hyperlipidemia, unspecified: Secondary | ICD-10-CM

## 2014-08-12 DIAGNOSIS — I10 Essential (primary) hypertension: Secondary | ICD-10-CM

## 2014-08-13 ENCOUNTER — Ambulatory Visit: Payer: Medicare Other | Admitting: *Deleted

## 2014-08-13 DIAGNOSIS — M722 Plantar fascial fibromatosis: Secondary | ICD-10-CM

## 2014-08-13 MED ORDER — SIMVASTATIN 10 MG PO TABS: 10.0000 mg | ORAL_TABLET | Freq: Every day | ORAL | Status: DC

## 2014-08-13 MED ORDER — LOSARTAN POTASSIUM 25 MG PO TABS: 25.0000 mg | ORAL_TABLET | Freq: Every day | ORAL | Status: DC

## 2014-08-13 MED ORDER — ATENOLOL 25 MG PO TABS: 25.0000 mg | ORAL_TABLET | Freq: Every day | ORAL | Status: DC

## 2014-08-13 MED ORDER — LEVOTHYROXINE SODIUM 100 MCG PO TABS: 100.0000 ug | ORAL_TABLET | Freq: Every day | ORAL | Status: DC

## 2014-08-13 NOTE — Patient Instructions (Signed)

## 2014-08-13 NOTE — Telephone Encounter (Signed)
Patient will be due for yearly follow up in October.  Can only provide refills to last until then.  6 refills sent in.

## 2014-08-13 NOTE — Progress Notes (Signed)
Patient ID: April Weber, female   DOB: 1945/08/05, 69 y.o.   MRN: [id number] PICKING UP INSERTS

## 2014-08-14 ENCOUNTER — Ambulatory Visit (INDEPENDENT_AMBULATORY_CARE_PROVIDER_SITE_OTHER): Payer: Medicare Other | Admitting: *Deleted

## 2014-08-14 ENCOUNTER — Encounter: Payer: Self-pay | Admitting: *Deleted

## 2014-08-14 VITALS — BP 159/60 | HR 74 | Resp 20 | Ht 66.5 in | Wt 143.0 lb

## 2014-08-14 DIAGNOSIS — Z1212 Encounter for screening for malignant neoplasm of rectum: Secondary | ICD-10-CM

## 2014-08-14 DIAGNOSIS — Z23 Encounter for immunization: Secondary | ICD-10-CM

## 2014-08-14 DIAGNOSIS — Z0000 Encounter for general adult medical examination without abnormal findings: Secondary | ICD-10-CM | POA: Diagnosis not present

## 2014-08-14 NOTE — Patient Instructions (Addendum)
Pneumococcal Vaccine, Polyvalent suspension for injection What is this medicine? PNEUMOCOCCAL VACCINE, POLYVALENT (NEU mo KOK al vak SEEN, pol ee VEY luhnt) is a vaccine to prevent pneumococcus bacteria infection. These bacteria are a major cause of ear infections, 'Strep throat' infections, and serious pneumonia, meningitis, or blood infections worldwide. These vaccines help the body to produce antibodies (protective substances) that help your body defend against these bacteria. This vaccine is recommended for infants and young children. This vaccine will not treat an infection. This medicine may be used for other purposes; ask your health care provider or pharmacist if you have questions. COMMON BRAND NAME(S): Prevnar 13 What should I tell my health care provider before I take this medicine? They need to know if you have any of these conditions: -bleeding problems -fever -immune system problems -low platelet count in the blood -seizures -an unusual or allergic reaction to pneumococcal vaccine, diphtheria toxoid, other vaccines, latex, other medicines, foods, dyes, or preservatives -pregnant or trying to get pregnant -breast-feeding How should I use this medicine? This vaccine is for injection into a muscle. It is given by a health care professional. A copy of Vaccine Information Statements will be given before each vaccination. Read this sheet carefully each time. The sheet may change frequently. Talk to your pediatrician regarding the use of this medicine in children. While this drug may be prescribed for children as young as 38 weeks old for selected conditions, precautions do apply. Overdosage: If you think you have taken too much of this medicine contact a poison control center or emergency room at once. NOTE: This medicine is only for you. Do not share this medicine with others. What if I miss a dose? It is important not to miss your dose. Call your doctor or health care professional if  you are unable to keep an appointment. What may interact with this medicine? -medicines for cancer chemotherapy -medicines that suppress your immune function -medicines that treat or prevent blood clots like warfarin, enoxaparin, and dalteparin -steroid medicines like prednisone or cortisone This list may not describe all possible interactions. Give your health care provider a list of all the medicines, herbs, non-prescription drugs, or dietary supplements you use. Also tell them if you smoke, drink alcohol, or use illegal drugs. Some items may interact with your medicine. What should I watch for while using this medicine? Mild fever and pain should go away in 3 days or less. Report any unusual symptoms to your doctor or health care professional. What side effects may I notice from receiving this medicine? Side effects that you should report to your doctor or health care professional as soon as possible: -allergic reactions like skin rash, itching or hives, swelling of the face, lips, or tongue -breathing problems -confused -fever over 102 degrees F -pain, tingling, numbness in the hands or feet -seizures -unusual bleeding or bruising -unusual muscle weakness Side effects that usually do not require medical attention (report to your doctor or health care professional if they continue or are bothersome): -aches and pains -diarrhea -fever of 102 degrees F or less -headache -irritable -loss of appetite -pain, tender at site where injected -trouble sleeping This list may not describe all possible side effects. Call your doctor for medical advice about side effects. You may report side effects to FDA at 1-800-FDA-1088. Where should I keep my medicine? This does not apply. This vaccine is given in a clinic, pharmacy, doctor's office, or other health care setting and will not be stored at home. NOTE:  This sheet is a summary. It may not cover all possible information. If you have questions  about this medicine, talk to your doctor, pharmacist, or health care provider.  2015, [employer]vier/Gold Standard. (2008-07-09 10:17:22) Preventive Care for Adults A healthy lifestyle and preventive care can promote health and wellness. Preventive health guidelines for women include the following key practices.  A routine yearly physical is a good way to check with your health care provider about your health and preventive screening. It is a chance to share any concerns and updates on your health and to receive a thorough exam.  Visit your dentist for a routine exam and preventive care every 6 months. Brush your teeth twice a day and floss once a day. Good oral hygiene prevents tooth decay and gum disease.  The frequency of eye exams is based on your age, health, family medical history, use of contact lenses, and other factors. Follow your health care provider's recommendations for frequency of eye exams.  Eat a healthy diet. Foods like vegetables, fruits, whole grains, low-fat dairy products, and lean protein foods contain the nutrients you need without too many calories. Decrease your intake of foods high in solid fats, added sugars, and salt. Eat the right amount of calories for you.Get information about a proper diet from your health care provider, if necessary.  Regular physical exercise is one of the most important things you can do for your health. Most adults should get at least 150 minutes of moderate-intensity exercise (any activity that increases your heart rate and causes you to sweat) each week. In addition, most adults need muscle-strengthening exercises on 2 or more days a week.  Maintain a healthy weight. The body mass index (BMI) is a screening tool to identify possible weight problems. It provides an estimate of body fat based on height and weight. Your health care provider can find your BMI and can help you achieve or maintain a healthy weight.For adults 20 years and older:  A BMI  below 18.5 is considered underweight.  A BMI of 18.5 to 24.9 is normal.  A BMI of 25 to 29.9 is considered overweight.  A BMI of 30 and above is considered obese.  Maintain normal blood lipids and cholesterol levels by exercising and minimizing your intake of saturated fat. Eat a balanced diet with plenty of fruit and vegetables. Blood tests for lipids and cholesterol should begin at age 40 and be repeated every 5 years. If your lipid or cholesterol levels are high, you are over 50, or you are at high risk for heart disease, you may need your cholesterol levels checked more frequently.Ongoing high lipid and cholesterol levels should be treated with medicines if diet and exercise are not working.  If you smoke, find out from your health care provider how to quit. If you do not use tobacco, do not start.  Lung cancer screening is recommended for adults aged 54-80 years who are at high risk for developing lung cancer because of a history of smoking. A yearly low-dose CT scan of the lungs is recommended for people who have at least a 30-pack-year history of smoking and are a current smoker or have quit within the past 15 years. A pack year of smoking is smoking an average of 1 pack of cigarettes a day for 1 year (for example: 1 pack a day for 30 years or 2 packs a day for 15 years). Yearly screening should continue until the smoker has stopped smoking for at least 15  years. Yearly screening should be stopped for people who develop a health problem that would prevent them from having lung cancer treatment.  If you are pregnant, do not drink alcohol. If you are breastfeeding, be very cautious about drinking alcohol. If you are not pregnant and choose to drink alcohol, do not have more than 1 drink per day. One drink is considered to be 12 ounces (355 mL) of beer, 5 ounces (148 mL) of wine, or 1.5 ounces (44 mL) of liquor.  Avoid use of street drugs. Do not share needles with anyone. Ask for help if you  need support or instructions about stopping the use of drugs.  High blood pressure causes heart disease and increases the risk of stroke. Your blood pressure should be checked at least every 1 to 2 years. Ongoing high blood pressure should be treated with medicines if weight loss and exercise do not work.  If you are 82-59 years old, ask your health care provider if you should take aspirin to prevent strokes.  Diabetes screening involves taking a blood sample to check your fasting blood sugar level. This should be done once every 3 years, after age 22, if you are within normal weight and without risk factors for diabetes. Testing should be considered at a younger age or be carried out more frequently if you are overweight and have at least 1 risk factor for diabetes.  Breast cancer screening is essential preventive care for women. You should practice "breast self-awareness." This means understanding the normal appearance and feel of your breasts and may include breast self-examination. Any changes detected, no matter how small, should be reported to a health care provider. Women in their 47s and 30s should have a clinical breast exam (CBE) by a health care provider as part of a regular health exam every 1 to 3 years. After age 55, women should have a CBE every year. Starting at age 37, women should consider having a mammogram (breast X-ray test) every year. Women who have a family history of breast cancer should talk to their health care provider about genetic screening. Women at a high risk of breast cancer should talk to their health care providers about having an MRI and a mammogram every year.  Breast cancer gene (BRCA)-related cancer risk assessment is recommended for women who have family members with BRCA-related cancers. BRCA-related cancers include breast, ovarian, tubal, and peritoneal cancers. Having family members with these cancers may be associated with an increased risk for harmful changes  (mutations) in the breast cancer genes BRCA1 and BRCA2. Results of the assessment will determine the need for genetic counseling and BRCA1 and BRCA2 testing.  Routine pelvic exams to screen for cancer are no longer recommended for nonpregnant women who are considered low risk for cancer of the pelvic organs (ovaries, uterus, and vagina) and who do not have symptoms. Ask your health care provider if a screening pelvic exam is right for you.  If you have had past treatment for cervical cancer or a condition that could lead to cancer, you need Pap tests and screening for cancer for at least 20 years after your treatment. If Pap tests have been discontinued, your risk factors (such as having a new sexual partner) need to be reassessed to determine if screening should be resumed. Some women have medical problems that increase the chance of getting cervical cancer. In these cases, your health care provider may recommend more frequent screening and Pap tests.  The HPV test is an  additional test that may be used for cervical cancer screening. The HPV test looks for the virus that can cause the cell changes on the cervix. The cells collected during the Pap test can be tested for HPV. The HPV test could be used to screen women aged 49 years and older, and should be used in women of any age who have unclear Pap test results. After the age of 50, women should have HPV testing at the same frequency as a Pap test.  Colorectal cancer can be detected and often prevented. Most routine colorectal cancer screening begins at the age of 28 years and continues through age 41 years. However, your health care provider may recommend screening at an earlier age if you have risk factors for colon cancer. On a yearly basis, your health care provider may provide home test kits to check for hidden blood in the stool. Use of a small camera at the end of a tube, to directly examine the colon (sigmoidoscopy or colonoscopy), can detect the  earliest forms of colorectal cancer. Talk to your health care provider about this at age 25, when routine screening begins. Direct exam of the colon should be repeated every 5-10 years through age 64 years, unless early forms of pre-cancerous polyps or small growths are found.  People who are at an increased risk for hepatitis B should be screened for this virus. You are considered at high risk for hepatitis B if:  You were born in a country where hepatitis B occurs often. Talk with your health care provider about which countries are considered high risk.  Your parents were born in a high-risk country and you have not received a shot to protect against hepatitis B (hepatitis B vaccine).  You have HIV or AIDS.  You use needles to inject street drugs.  You live with, or have sex with, someone who has hepatitis B.  You get hemodialysis treatment.  You take certain medicines for conditions like cancer, organ transplantation, and autoimmune conditions.  Hepatitis C blood testing is recommended for all people born from [birth year] through 1966 and any individual with known risks for hepatitis C.  Practice safe sex. Use condoms and avoid high-risk sexual practices to reduce the spread of sexually transmitted infections (STIs). STIs include gonorrhea, chlamydia, syphilis, trichomonas, herpes, HPV, and human immunodeficiency virus (HIV). Herpes, HIV, and HPV are viral illnesses that have no cure. They can result in disability, cancer, and death.  You should be screened for sexually transmitted illnesses (STIs) including gonorrhea and chlamydia if:  You are sexually active and are younger than 24 years.  You are older than 24 years and your health care provider tells you that you are at risk for this type of infection.  Your sexual activity has changed since you were last screened and you are at an increased risk for chlamydia or gonorrhea. Ask your health care provider if you are at risk.  If you are  at risk of being infected with HIV, it is recommended that you take a prescription medicine daily to prevent HIV infection. This is called preexposure prophylaxis (PrEP). You are considered at risk if:  You are a heterosexual woman, are sexually active, and are at increased risk for HIV infection.  You take drugs by injection.  You are sexually active with a partner who has HIV.  Talk with your health care provider about whether you are at high risk of being infected with HIV. If you choose to begin PrEP, you  should first be tested for HIV. You should then be tested every 3 months for as long as you are taking PrEP.  Osteoporosis is a disease in which the bones lose minerals and strength with aging. This can result in serious bone fractures or breaks. The risk of osteoporosis can be identified using a bone density scan. Women ages 15 years and over and women at risk for fractures or osteoporosis should discuss screening with their health care providers. Ask your health care provider whether you should take a calcium supplement or vitamin D to reduce the rate of osteoporosis.  Menopause can be associated with physical symptoms and risks. Hormone replacement therapy is available to decrease symptoms and risks. You should talk to your health care provider about whether hormone replacement therapy is right for you.  Use sunscreen. Apply sunscreen liberally and repeatedly throughout the day. You should seek shade when your shadow is shorter than you. Protect yourself by wearing long sleeves, pants, a wide-brimmed hat, and sunglasses year round, whenever you are outdoors.  Once a month, do a whole body skin exam, using a mirror to look at the skin on your back. Tell your health care provider of new moles, moles that have irregular borders, moles that are larger than a pencil eraser, or moles that have changed in shape or color.  Stay current with required vaccines (immunizations).  Influenza vaccine.  All adults should be immunized every year.  Tetanus, diphtheria, and acellular pertussis (Td, Tdap) vaccine. Pregnant women should receive 1 dose of Tdap vaccine during each pregnancy. The dose should be obtained regardless of the length of time since the last dose. Immunization is preferred during the 27th-36th week of gestation. An adult who has not previously received Tdap or who does not know her vaccine status should receive 1 dose of Tdap. This initial dose should be followed by tetanus and diphtheria toxoids (Td) booster doses every 10 years. Adults with an unknown or incomplete history of completing a 3-dose immunization series with Td-containing vaccines should begin or complete a primary immunization series including a Tdap dose. Adults should receive a Td booster every 10 years.  Varicella vaccine. An adult without evidence of immunity to varicella should receive 2 doses or a second dose if she has previously received 1 dose. Pregnant females who do not have evidence of immunity should receive the first dose after pregnancy. This first dose should be obtained before leaving the health care facility. The second dose should be obtained 4-8 weeks after the first dose.  Human papillomavirus (HPV) vaccine. Females aged 13-26 years who have not received the vaccine previously should obtain the 3-dose series. The vaccine is not recommended for use in pregnant females. However, pregnancy testing is not needed before receiving a dose. If a female is found to be pregnant after receiving a dose, no treatment is needed. In that case, the remaining doses should be delayed until after the pregnancy. Immunization is recommended for any person with an immunocompromised condition through the age of 16 years if she did not get any or all doses earlier. During the 3-dose series, the second dose should be obtained 4-8 weeks after the first dose. The third dose should be obtained 24 weeks after the first dose and 16  weeks after the second dose.  Zoster vaccine. One dose is recommended for adults aged 58 years or older unless certain conditions are present.  Measles, mumps, and rubella (MMR) vaccine. Adults born before [birth year] generally are considered immune  to measles and mumps. Adults born in [birth year] or later should have 1 or more doses of MMR vaccine unless there is a contraindication to the vaccine or there is laboratory evidence of immunity to each of the three diseases. A routine second dose of MMR vaccine should be obtained at least 28 days after the first dose for students attending postsecondary schools, health care workers, or international travelers. People who received inactivated measles vaccine or an unknown type of measles vaccine during 1963-1967 should receive 2 doses of MMR vaccine. People who received inactivated mumps vaccine or an unknown type of mumps vaccine before 1979 and are at high risk for mumps infection should consider immunization with 2 doses of MMR vaccine. For females of childbearing age, rubella immunity should be determined. If there is no evidence of immunity, females who are not pregnant should be vaccinated. If there is no evidence of immunity, females who are pregnant should delay immunization until after pregnancy. Unvaccinated health care workers born before [birth year] who lack laboratory evidence of measles, mumps, or rubella immunity or laboratory confirmation of disease should consider measles and mumps immunization with 2 doses of MMR vaccine or rubella immunization with 1 dose of MMR vaccine.  Pneumococcal 13-valent conjugate (PCV13) vaccine. When indicated, a person who is uncertain of her immunization history and has no record of immunization should receive the PCV13 vaccine. An adult aged 94 years or older who has certain medical conditions and has not been previously immunized should receive 1 dose of PCV13 vaccine. This PCV13 should be followed with a dose of pneumococcal  polysaccharide (PPSV23) vaccine. The PPSV23 vaccine dose should be obtained at least 8 weeks after the dose of PCV13 vaccine. An adult aged 27 years or older who has certain medical conditions and previously received 1 or more doses of PPSV23 vaccine should receive 1 dose of PCV13. The PCV13 vaccine dose should be obtained 1 or more years after the last PPSV23 vaccine dose.  Pneumococcal polysaccharide (PPSV23) vaccine. When PCV13 is also indicated, PCV13 should be obtained first. All adults aged 60 years and older should be immunized. An adult younger than age 48 years who has certain medical conditions should be immunized. Any person who resides in a nursing home or long-term care facility should be immunized. An adult smoker should be immunized. People with an immunocompromised condition and certain other conditions should receive both PCV13 and PPSV23 vaccines. People with human immunodeficiency virus (HIV) infection should be immunized as soon as possible after diagnosis. Immunization during chemotherapy or radiation therapy should be avoided. Routine use of PPSV23 vaccine is not recommended for American Indians, [place]atives, or people younger than 65 years unless there are medical conditions that require PPSV23 vaccine. When indicated, people who have unknown immunization and have no record of immunization should receive PPSV23 vaccine. One-time revaccination 5 years after the first dose of PPSV23 is recommended for people aged 19-64 years who have chronic kidney failure, nephrotic syndrome, asplenia, or immunocompromised conditions. People who received 1-2 doses of PPSV23 before age 58 years should receive another dose of PPSV23 vaccine at age 68 years or later if at least 5 years have passed since the previous dose. Doses of PPSV23 are not needed for people immunized with PPSV23 at or after age 79 years.  Meningococcal vaccine. Adults with asplenia or persistent complement component deficiencies  should receive 2 doses of quadrivalent meningococcal conjugate (MenACWY-D) vaccine. The doses should be obtained at least 2 months apart. Microbiologists working with certain  meningococcal bacteria, [employer] recruits, people at risk during an outbreak, and people who travel to or live in countries with a high rate of meningitis should be immunized. A first-year college student up through age 29 years who is living in a residence hall should receive a dose if she did not receive a dose on or after her 16th birthday. Adults who have certain high-risk conditions should receive one or more doses of vaccine.  Hepatitis A vaccine. Adults who wish to be protected from this disease, have certain high-risk conditions, work with hepatitis A-infected animals, work in hepatitis A research labs, or travel to or work in countries with a high rate of hepatitis A should be immunized. Adults who were previously unvaccinated and who anticipate close contact with an international adoptee during the first 60 days after arrival in the [place] from a country with a high rate of hepatitis A should be immunized.  Hepatitis B vaccine. Adults who wish to be protected from this disease, have certain high-risk conditions, may be exposed to blood or other infectious body fluids, are household contacts or sex partners of hepatitis B positive people, are clients or workers in certain care facilities, or travel to or work in countries with a high rate of hepatitis B should be immunized.  Haemophilus influenzae type b (Hib) vaccine. A previously unvaccinated person with asplenia or sickle cell disease or having a scheduled splenectomy should receive 1 dose of Hib vaccine. Regardless of previous immunization, a recipient of a hematopoietic stem cell transplant should receive a 3-dose series 6-12 months after her successful transplant. Hib vaccine is not recommended for adults with HIV infection. Preventive Services / Frequency Ages 20  to 9 years  Blood pressure check.** / Every 1 to 2 years.  Lipid and cholesterol check.** / Every 5 years beginning at age 24.  Clinical breast exam.** / Every 3 years for women in their 45s and 44s.  BRCA-related cancer risk assessment.** / For women who have family members with a BRCA-related cancer (breast, ovarian, tubal, or peritoneal cancers).  Pap test.** / Every 2 years from ages 37 through 2. Every 3 years starting at age 35 through age 42 or 85 with a history of 3 consecutive normal Pap tests.  HPV screening.** / Every 3 years from ages 26 through ages 62 to 80 with a history of 3 consecutive normal Pap tests.  Hepatitis C blood test.** / For any individual with known risks for hepatitis C.  Skin self-exam. / Monthly.  Influenza vaccine. / Every year.  Tetanus, diphtheria, and acellular pertussis (Tdap, Td) vaccine.** / Consult your health care provider. Pregnant women should receive 1 dose of Tdap vaccine during each pregnancy. 1 dose of Td every 10 years.  Varicella vaccine.** / Consult your health care provider. Pregnant females who do not have evidence of immunity should receive the first dose after pregnancy.  HPV vaccine. / 3 doses over 6 months, if 35 and younger. The vaccine is not recommended for use in pregnant females. However, pregnancy testing is not needed before receiving a dose.  Measles, mumps, rubella (MMR) vaccine.** / You need at least 1 dose of MMR if you were born in 1957 or later. You may also need a 2nd dose. For females of childbearing age, rubella immunity should be determined. If there is no evidence of immunity, females who are not pregnant should be vaccinated. If there is no evidence of immunity, females who are pregnant should delay immunization until after pregnancy.  Pneumococcal 13-valent conjugate (PCV13) vaccine.** / Consult your health care provider.  Pneumococcal polysaccharide (PPSV23) vaccine.** / 1 to 2 doses if you smoke cigarettes  or if you have certain conditions.  Meningococcal vaccine.** / 1 dose if you are age 79 to 27 years and a Market researcher living in a residence hall, or have one of several medical conditions, you need to get vaccinated against meningococcal disease. You may also need additional booster doses.  Hepatitis A vaccine.** / Consult your health care provider.  Hepatitis B vaccine.** / Consult your health care provider.  Haemophilus influenzae type b (Hib) vaccine.** / Consult your health care provider. Ages 2 to 76 years  Blood pressure check.** / Every 1 to 2 years.  Lipid and cholesterol check.** / Every 5 years beginning at age 3 years.  Lung cancer screening. / Every year if you are aged 74-80 years and have a 30-pack-year history of smoking and currently smoke or have quit within the past 15 years. Yearly screening is stopped once you have quit smoking for at least 15 years or develop a health problem that would prevent you from having lung cancer treatment.  Clinical breast exam.** / Every year after age 17 years.  BRCA-related cancer risk assessment.** / For women who have family members with a BRCA-related cancer (breast, ovarian, tubal, or peritoneal cancers).  Mammogram.** / Every year beginning at age 43 years and continuing for as long as you are in good health. Consult with your health care provider.  Pap test.** / Every 3 years starting at age 76 years through age 32 or 46 years with a history of 3 consecutive normal Pap tests.  HPV screening.** / Every 3 years from ages 63 years through ages 6 to 21 years with a history of 3 consecutive normal Pap tests.  Fecal occult blood test (FOBT) of stool. / Every year beginning at age 67 years and continuing until age 4 years. You may not need to do this test if you get a colonoscopy every 10 years.  Flexible sigmoidoscopy or colonoscopy.** / Every 5 years for a flexible sigmoidoscopy or every 10 years for a colonoscopy  beginning at age 63 years and continuing until age 21 years.  Hepatitis C blood test.** / For all people born from [birth year] through 1965 and any individual with known risks for hepatitis C.  Skin self-exam. / Monthly.  Influenza vaccine. / Every year.  Tetanus, diphtheria, and acellular pertussis (Tdap/Td) vaccine.** / Consult your health care provider. Pregnant women should receive 1 dose of Tdap vaccine during each pregnancy. 1 dose of Td every 10 years.  Varicella vaccine.** / Consult your health care provider. Pregnant females who do not have evidence of immunity should receive the first dose after pregnancy.  Zoster vaccine.** / 1 dose for adults aged 32 years or older.  Measles, mumps, rubella (MMR) vaccine.** / You need at least 1 dose of MMR if you were born in 1957 or later. You may also need a 2nd dose. For females of childbearing age, rubella immunity should be determined. If there is no evidence of immunity, females who are not pregnant should be vaccinated. If there is no evidence of immunity, females who are pregnant should delay immunization until after pregnancy.  Pneumococcal 13-valent conjugate (PCV13) vaccine.** / Consult your health care provider.  Pneumococcal polysaccharide (PPSV23) vaccine.** / 1 to 2 doses if you smoke cigarettes or if you have certain conditions.  Meningococcal vaccine.** / Consult your health care provider.  Hepatitis A vaccine.** / Consult your health care provider.  Hepatitis B vaccine.** / Consult your health care provider.  Haemophilus influenzae type b (Hib) vaccine.** / Consult your health care provider. Ages 45 years and over  Blood pressure check.** / Every 1 to 2 years.  Lipid and cholesterol check.** / Every 5 years beginning at age 47 years.  Lung cancer screening. / Every year if you are aged 1-80 years and have a 30-pack-year history of smoking and currently smoke or have quit within the past 15 years. Yearly screening is stopped  once you have quit smoking for at least 15 years or develop a health problem that would prevent you from having lung cancer treatment.  Clinical breast exam.** / Every year after age 82 years.  BRCA-related cancer risk assessment.** / For women who have family members with a BRCA-related cancer (breast, ovarian, tubal, or peritoneal cancers).  Mammogram.** / Every year beginning at age 55 years and continuing for as long as you are in good health. Consult with your health care provider.  Pap test.** / Every 3 years starting at age 68 years through age 51 or 49 years with 3 consecutive normal Pap tests. Testing can be stopped between 65 and 70 years with 3 consecutive normal Pap tests and no abnormal Pap or HPV tests in the past 10 years.  HPV screening.** / Every 3 years from ages 7 years through ages 20 or 88 years with a history of 3 consecutive normal Pap tests. Testing can be stopped between 65 and 70 years with 3 consecutive normal Pap tests and no abnormal Pap or HPV tests in the past 10 years.  Fecal occult blood test (FOBT) of stool. / Every year beginning at age 47 years and continuing until age 32 years. You may not need to do this test if you get a colonoscopy every 10 years.  Flexible sigmoidoscopy or colonoscopy.** / Every 5 years for a flexible sigmoidoscopy or every 10 years for a colonoscopy beginning at age 44 years and continuing until age 53 years.  Hepatitis C blood test.** / For all people born from [birth year] through 1966 and any individual with known risks for hepatitis C.  Osteoporosis screening.** / A one-time screening for women ages 56 years and over and women at risk for fractures or osteoporosis.  Skin self-exam. / Monthly.  Influenza vaccine. / Every year.  Tetanus, diphtheria, and acellular pertussis (Tdap/Td) vaccine.** / 1 dose of Td every 10 years.  Varicella vaccine.** / Consult your health care provider.  Zoster vaccine.** / 1 dose for adults aged 80 years  or older.  Pneumococcal 13-valent conjugate (PCV13) vaccine.** / Consult your health care provider.  Pneumococcal polysaccharide (PPSV23) vaccine.** / 1 dose for all adults aged 10 years and older.  Meningococcal vaccine.** / Consult your health care provider.  Hepatitis A vaccine.** / Consult your health care provider.  Hepatitis B vaccine.** / Consult your health care provider.  Haemophilus influenzae type b (Hib) vaccine.** / Consult your health care provider. ** Family history and personal history of risk and conditions may change your health care provider's recommendations. Document Released: 06/22/2001 Document Revised: 09/10/2013 Document Reviewed: 09/21/2010 [facility] Patient Information 2015 [place], [place]. This information is not intended to replace advice given to you by your health care provider. Make sure you discuss any questions you have with your health care provider. DASH Eating Plan DASH stands for "Dietary Approaches to Stop Hypertension." The DASH eating plan is a healthy eating plan  that has been shown to reduce high blood pressure (hypertension). Additional health benefits may include reducing the risk of type 2 diabetes mellitus, heart disease, and stroke. The DASH eating plan may also help with weight loss. WHAT DO I NEED TO KNOW ABOUT THE DASH EATING PLAN? For the DASH eating plan, you will follow these general guidelines:  Choose foods with a percent daily value for sodium of less than 5% (as listed on the food label).  Use salt-free seasonings or herbs instead of table salt or sea salt.  Check with your health care provider or pharmacist before using salt substitutes.  Eat lower-sodium products, often labeled as "lower sodium" or "no salt added."  Eat fresh foods.  Eat more vegetables, fruits, and low-fat dairy products.  Choose whole grains. Look for the word "whole" as the first word in the ingredient list.  Choose fish and skinless chicken or [place]  more often than red meat. Limit fish, poultry, and meat to 6 oz (170 g) each day.  Limit sweets, desserts, sugars, and sugary drinks.  Choose heart-healthy fats.  Limit cheese to 1 oz (28 g) per day.  Eat more home-cooked food and less restaurant, buffet, and fast food.  Limit fried foods.  Cook foods using methods other than frying.  Limit canned vegetables. If you do use them, rinse them well to decrease the sodium.  When eating at a restaurant, ask that your food be prepared with less salt, or no salt if possible. WHAT FOODS CAN I EAT? Seek help from a dietitian for individual calorie needs. Grains Whole grain or whole wheat bread. Brown rice. Whole grain or whole wheat pasta. Quinoa, bulgur, and whole grain cereals. Low-sodium cereals. Corn or whole wheat flour tortillas. Whole grain cornbread. Whole grain crackers. Low-sodium crackers. Vegetables Fresh or frozen vegetables (raw, steamed, roasted, or grilled). Low-sodium or reduced-sodium tomato and vegetable juices. Low-sodium or reduced-sodium tomato sauce and paste. Low-sodium or reduced-sodium canned vegetables.  Fruits All fresh, canned (in natural juice), or frozen fruits. Meat and Other Protein Products Ground beef (85% or leaner), grass-fed beef, or beef trimmed of fat. Skinless chicken or [place]. Ground chicken or [place]. Pork trimmed of fat. All fish and seafood. Eggs. Dried beans, peas, or lentils. Unsalted nuts and seeds. Unsalted canned beans. Dairy Low-fat dairy products, such as skim or 1% milk, 2% or reduced-fat cheeses, low-fat ricotta or cottage cheese, or plain low-fat yogurt. Low-sodium or reduced-sodium cheeses. Fats and Oils Tub margarines without trans fats. Light or reduced-fat mayonnaise and salad dressings (reduced sodium). Avocado. Safflower, olive, or canola oils. Natural peanut or almond butter. Other Unsalted popcorn and pretzels. The items listed above may not be a complete list of recommended foods  or beverages. Contact your dietitian for more options. WHAT FOODS ARE NOT RECOMMENDED? Grains White bread. White pasta. White rice. Refined cornbread. Bagels and croissants. Crackers that contain trans fat. Vegetables Creamed or fried vegetables. Vegetables in a cheese sauce. Regular canned vegetables. Regular canned tomato sauce and paste. Regular tomato and vegetable juices. Fruits Dried fruits. Canned fruit in light or heavy syrup. Fruit juice. Meat and Other Protein Products Fatty cuts of meat. Ribs, chicken wings, bacon, sausage, bologna, salami, chitterlings, fatback, hot dogs, bratwurst, and packaged luncheon meats. Salted nuts and seeds. Canned beans with salt. Dairy Whole or 2% milk, cream, half-and-half, and cream cheese. Whole-fat or sweetened yogurt. Full-fat cheeses or blue cheese. Nondairy creamers and whipped toppings. Processed cheese, cheese spreads, or cheese curds. Condiments Onion and  garlic salt, seasoned salt, table salt, and sea salt. Canned and packaged gravies. Worcestershire sauce. Tartar sauce. Barbecue sauce. Teriyaki sauce. Soy sauce, including reduced sodium. Steak sauce. Fish sauce. Oyster sauce. Cocktail sauce. Horseradish. Ketchup and mustard. Meat flavorings and tenderizers. Bouillon cubes. Hot sauce. Tabasco sauce. Marinades. Taco seasonings. Relishes. Fats and Oils Butter, stick margarine, lard, shortening, ghee, and bacon fat. Coconut, palm kernel, or palm oils. Regular salad dressings. Other Pickles and olives. Salted popcorn and pretzels. The items listed above may not be a complete list of foods and beverages to avoid. Contact your dietitian for more information. WHERE CAN I FIND MORE INFORMATION? National Heart, Lung, and Blood Institute: travelstabloid.com Document Released: 04/15/2011 Document Revised: 09/10/2013 Document Reviewed: 02/28/2013 [facility] Patient Information 2015 [place], [place]. This information is not  intended to replace advice given to you by your health care provider. Make sure you discuss any questions you have with your health care provider.

## 2014-08-14 NOTE — Progress Notes (Signed)
Subjective:   April Weber is a 69 y.o. female who presents for an Initial Medicare Annual Wellness Visit Patient is alert,oriented to person, place, and time. She reports that she is pretty healthy for her age compared to her friends that are her same age. She is divorced and lives alone and maintains an active healthy lifestyle. She tries to stay busy and enjoys going to yard sales and visiting her daughter who is a PA that lives in [place], [place]. Her initial BP upon arrival was 162/89 and repeat BP at the end of visit was 159/60. She reports that her BP is always high when she comes to the Dr.and that she has "White Coat Syndrome" She does check her BP at home and it is usually normal. She will record her BP daily and return her readings to me next week. Review of Systems     Cardiac Risk Factors include: advanced age (>52men, >33 women);dyslipidemia;hypertension;smoking/ tobacco exposure     Objective:    Today's Vitals   08/14/14 1412  BP: 159/60  Pulse: 74  Resp: 20  Height: 5' 6.5" (1.689 m)  Weight: 143 lb (64.864 kg)  PainSc: 0-No pain    Current Medications (verified) Outpatient Encounter Prescriptions as of 08/14/2014  Medication Sig  . aspirin 81 MG EC tablet Take 81 mg by mouth daily.    Marland Kitchen atenolol (TENORMIN) 25 MG tablet Take 1 tablet (25 mg total) by mouth daily.  . Calcium 600-200 MG-UNIT per tablet Take 1 tablet by mouth 2 (two) times daily.   . fish oil-omega-3 fatty acids 1000 MG capsule Take 1 g by mouth daily. Takes 1 tab 1400mg   . levothyroxine (SYNTHROID, LEVOTHROID) 100 MCG tablet Take 1 tablet (100 mcg total) by mouth daily.  Marland Kitchen losartan (COZAAR) 25 MG tablet Take 1 tablet (25 mg total) by mouth daily.  . Multiple Vitamin (MULTIVITAMIN) capsule Take 1 capsule by mouth daily.    . simvastatin (ZOCOR) 10 MG tablet Take 1 tablet (10 mg total) by mouth at bedtime.  . [DISCONTINUED] diclofenac sodium (VOLTAREN) 1 % GEL Apply 2 g topically 4 (four) times daily.     Allergies (verified) Codeine; Doxycycline; Iodine; and Penicillins   History: Past Medical History  Diagnosis Date  . Osteopenia   . Chronic kidney disease     has one kidney  . Hypertension   . Hyperlipidemia   . Thyroid disease   . Allergy     seasonal   Past Surgical History  Procedure Laterality Date  . Abdominal hysterectomy    . Thyroid surgery    . Mastectomy Right 1984  . Appendectomy    . Eye surgery      69 years old   Family History  Problem Relation Age of Onset  . Heart attack Mother   . Alcohol abuse Mother    Social History   Occupational History  .      retired   Social History Main Topics  . Smoking status: Former Smoker -- 0.25 packs/day    Types: Cigarettes    Quit date: 05/11/1967  . Smokeless tobacco: Not on file  . Alcohol Use: No  . Drug Use: No  . Sexual Activity: No    Tobacco Counseling No: patient quit smoking in 1969 when she found out she was pregnant with her daughter  Activities of Daily Living In your present state of health, do you have any difficulty performing the following activities: 08/14/2014  Is the patient  deaf or have difficulty hearing? N  Hearing N  Vision N  Difficulty concentrating or making decisions N  Walking or climbing stairs? N  Doing errands, shopping? N  Preparing Food and eating ? N  Using the Toilet? N  In the past six months, have you accidently leaked urine? N  Do you have problems with loss of bowel control? N  Managing your Medications? N  Managing your Finances? N  Housekeeping or managing your Housekeeping? N    Immunizations and Health Maintenance Immunization History  Administered Date(s) Administered  . Influenza Whole 02/16/2010  . Influenza,inj,Quad PF,36+ Mos 02/13/2013, 02/13/2014  . Pneumococcal Conjugate-13 08/14/2014  . Pneumococcal Polysaccharide-23 10/24/2009  . Tdap 08/28/2008  . Zoster 03/23/2007   There are no preventive care reminders to display for this  patient.     Assessment:   This is a routine wellness examination for April Weber.   Hearing/Vision screen No hearing deficits noted.  Wears glasses and patient goes to [facility] in [place] and reports that she went in 2015 and will call when it it time for a visit.  Dietary issues and exercise activities discussed: Current Exercise Habits:: Home exercise routine, Type of exercise: walking, Time (Minutes): 40, Frequency (Times/Week): 5, Weekly Exercise (Minutes/Week): 200, Intensity: Moderate  Goals are to continue with active lifestyle with walking 5 times a week.    Depression Screen PHQ 2/9 Scores 08/14/2014  PHQ - 2 Score 0    Fall Risk Fall Risk  08/14/2014  Falls in the past year? No    Cognitive Function: MMSE - Mini Mental State Exam 08/14/2014  Orientation to time 5  Orientation to Place 5  Registration 3  Attention/ Calculation 5  Recall 3  Language- name 2 objects 2  Language- repeat 1  Language- follow 3 step command 3  Language- read & follow direction 1  Write a sentence 1  Copy design 1  Total score 30    Screening Tests Health Maintenance  Topic Date Due  . INFLUENZA VACCINE  12/09/2014  . MAMMOGRAM  02/12/2015  . DEXA SCAN  10/18/2015  . TETANUS/TDAP  08/29/2018  . COLONOSCOPY  05/10/2020  . ZOSTAVAX  Completed  . PNA vac Low Risk Adult  Completed      Plan:  Continue good therapeutic lifestyle changes with good diet and exercise. Recommended and info given regarding DASH diet. Continue to monitor BP and record.  Continue yearly mammograms and monthly self breast exams. Hx of Right Mastectomy in 1984 due to Breast Cancer. During the course of the visit, April Weber was educated and counseled about the following appropriate screening and preventive services:   Vaccines to include Pneumoccal, Influenza, Tdap, Zostavax all vaccines UTD except prevnar 13 and that was given today and pt tolerated well.  Electrocardiogram due at next office visit    Colorectal cancer screening colonoscopy completed by Dr. Deatra Ina 2012 and she reports she will not have another one. FOBT given and explained to patient and she will return in a couple of weeks.  Bone density screening completed 10/17/2013 @ [facility] patient due 10/2015  Diabetes screening: glucose testing completed at regular office visits noted as normal  Mammography: completed 02/11/14 Hx of Right Mastectomy in 1984. Pt will call when due always receives a letter reminder.  PAP/Pelvic: pt reports she had one around 3 years ago and she refuses to have another one at her age. Encouraged pelvic exam every 2 years.   Nutrition counseling: DASH diet information given  Patient  Instructions (the written plan) were given to the patient.    Joneen Boers, RN   08/14/2014      I have reviewed and agree with the above AWV documentation.  Claretta Fraise, M.D.

## 2014-08-15 ENCOUNTER — Other Ambulatory Visit: Payer: Medicare Other

## 2014-08-15 DIAGNOSIS — Z1212 Encounter for screening for malignant neoplasm of rectum: Secondary | ICD-10-CM | POA: Diagnosis not present

## 2014-08-15 NOTE — Progress Notes (Signed)
Lab only 

## 2014-08-16 LAB — FECAL OCCULT BLOOD, IMMUNOCHEMICAL: FECAL OCCULT BLD: POSITIVE — AB

## 2014-08-19 ENCOUNTER — Other Ambulatory Visit (INDEPENDENT_AMBULATORY_CARE_PROVIDER_SITE_OTHER): Payer: Medicare Other

## 2014-08-19 DIAGNOSIS — K921 Melena: Secondary | ICD-10-CM | POA: Diagnosis not present

## 2014-08-19 LAB — POCT CBC
Granulocyte percent: 75 %G (ref 37–80)
HCT, POC: 44.4 % (ref 37.7–47.9)
HEMOGLOBIN: 14.2 g/dL (ref 12.2–16.2)
Lymph, poc: 1.3 (ref 0.6–3.4)
MCH, POC: 28.9 pg (ref 27–31.2)
MCHC: 31.9 g/dL (ref 31.8–35.4)
MCV: 90.5 fL (ref 80–97)
MPV: 8.5 fL (ref 0–99.8)
POC GRANULOCYTE: 5.4 (ref 2–6.9)
POC LYMPH %: 18.2 % (ref 10–50)
Platelet Count, POC: 247 10*3/uL (ref 142–424)
RBC: 4.91 M/uL (ref 4.04–5.48)
RDW, POC: 12.5 %
WBC: 7.2 10*3/uL (ref 4.6–10.2)

## 2014-08-19 NOTE — Progress Notes (Signed)
Lab only 

## 2014-08-20 ENCOUNTER — Other Ambulatory Visit: Payer: Medicare Other

## 2014-08-20 DIAGNOSIS — Z1212 Encounter for screening for malignant neoplasm of rectum: Secondary | ICD-10-CM | POA: Diagnosis not present

## 2014-08-20 NOTE — Progress Notes (Signed)
Lab only 

## 2014-08-22 LAB — FECAL OCCULT BLOOD, IMMUNOCHEMICAL: FECAL OCCULT BLD: NEGATIVE

## 2014-09-16 ENCOUNTER — Other Ambulatory Visit: Payer: Medicare Other

## 2014-09-16 DIAGNOSIS — Z1212 Encounter for screening for malignant neoplasm of rectum: Secondary | ICD-10-CM | POA: Diagnosis not present

## 2014-09-16 NOTE — Progress Notes (Signed)
Lab only 

## 2014-09-18 LAB — FECAL OCCULT BLOOD, IMMUNOCHEMICAL: FECAL OCCULT BLD: NEGATIVE

## 2014-11-15 ENCOUNTER — Telehealth: Payer: Self-pay | Admitting: Family Medicine

## 2014-11-15 NOTE — Telephone Encounter (Signed)
Please write for these

## 2014-11-17 NOTE — Telephone Encounter (Signed)
Okay to write order for pt request

## 2014-11-18 NOTE — Telephone Encounter (Signed)
April Weber please do this

## 2014-11-19 MED ORDER — ILLUSIONS C BREAST PROSTHESIS MISC: Status: DC

## 2014-11-19 MED ORDER — MISC. DEVICES MISC
Status: DC
Start: 2014-11-19 — End: 2017-12-06

## 2014-11-20 NOTE — Telephone Encounter (Signed)
Prescriptions printed and placed up front for pickup. Patient aware.

## 2014-12-18 ENCOUNTER — Encounter: Payer: Self-pay | Admitting: Gastroenterology

## 2015-02-03 ENCOUNTER — Other Ambulatory Visit: Payer: Self-pay

## 2015-02-03 DIAGNOSIS — Z1231 Encounter for screening mammogram for malignant neoplasm of breast: Secondary | ICD-10-CM

## 2015-02-12 ENCOUNTER — Encounter: Payer: Self-pay | Admitting: Family Medicine

## 2015-02-12 ENCOUNTER — Ambulatory Visit (INDEPENDENT_AMBULATORY_CARE_PROVIDER_SITE_OTHER): Payer: Medicare Other | Admitting: Family Medicine

## 2015-02-12 VITALS — BP 175/86 | HR 51 | Temp 96.1°F | Ht 66.5 in | Wt 141.0 lb

## 2015-02-12 DIAGNOSIS — I10 Essential (primary) hypertension: Secondary | ICD-10-CM

## 2015-02-12 DIAGNOSIS — E890 Postprocedural hypothyroidism: Secondary | ICD-10-CM

## 2015-02-12 DIAGNOSIS — Z23 Encounter for immunization: Secondary | ICD-10-CM | POA: Diagnosis not present

## 2015-02-12 DIAGNOSIS — E785 Hyperlipidemia, unspecified: Secondary | ICD-10-CM | POA: Diagnosis not present

## 2015-02-12 MED ORDER — SIMVASTATIN 10 MG PO TABS: 10.0000 mg | ORAL_TABLET | Freq: Every day | ORAL | Status: DC

## 2015-02-12 MED ORDER — LOSARTAN POTASSIUM 25 MG PO TABS: 25.0000 mg | ORAL_TABLET | Freq: Every day | ORAL | Status: DC

## 2015-02-12 MED ORDER — LEVOTHYROXINE SODIUM 100 MCG PO TABS: 100.0000 ug | ORAL_TABLET | Freq: Every day | ORAL | Status: DC

## 2015-02-12 MED ORDER — ATENOLOL 25 MG PO TABS: 25.0000 mg | ORAL_TABLET | Freq: Every day | ORAL | Status: DC

## 2015-02-12 NOTE — Progress Notes (Signed)
Subjective:    Patient ID: April Weber, female    DOB: May 22, 1945, 69 y.o.   MRN: [id number]  HPI 69 year old female who is here to follow-up hypertension and hyperlipidemia. She has a remote history of breast cancer 1994 with mastectomy. She brings in recordings of her blood pressure for the last week taking pressures in the morning and the evening and they have all been in the 120-70 range. That is distinctly lower than the pressure week here today which was 175/86. I've asked her to bring her blood pressure cuff with her next time so we can do a comparison. She also expresses an interest in coming off of her statin. I'm willing to try her off the statin. We'll see what it is today stop it and then recheck in 2 months. She has no other symptoms or complaints today.  Patient Active Problem List   Diagnosis Date Noted  . HLD (hyperlipidemia) 02/13/2013  . Need for prophylactic vaccination and inoculation against influenza 02/13/2013  . Hypothyroidism 08/04/2012  . Osteopenia 08/12/2010  . Breast CA ([place]) 08/12/2010  . Hyperlipidemia 08/12/2010  . HTN (hypertension) 08/12/2010   Outpatient Encounter Prescriptions as of 02/12/2015  Medication Sig  . aspirin 81 MG EC tablet Take 81 mg by mouth daily.    Marland Kitchen atenolol (TENORMIN) 25 MG tablet Take 1 tablet (25 mg total) by mouth daily.  . Calcium 600-200 MG-UNIT per tablet Take 1 tablet by mouth 2 (two) times daily.   . fish oil-omega-3 fatty acids 1000 MG capsule Take 1 g by mouth daily. Takes 1 tab 1427m  . levothyroxine (SYNTHROID, LEVOTHROID) 100 MCG tablet Take 1 tablet (100 mcg total) by mouth daily.  .Marland Kitchenlosartan (COZAAR) 25 MG tablet Take 1 tablet (25 mg total) by mouth daily.  . Misc. Devices (ILLUSIONS C BREAST PROSTHESIS) MISC Use as needed. Dx:C50.919  . Misc. Devices MISC Mastectomy bras (right mastectomy). Use as needed. Dx:C50.919  . Multiple Vitamin (MULTIVITAMIN) capsule Take 1 capsule by mouth daily.    . simvastatin  (ZOCOR) 10 MG tablet Take 1 tablet (10 mg total) by mouth at bedtime.   No facility-administered encounter medications on file as of 02/12/2015.      Review of Systems  Constitutional: Negative.   Respiratory: Negative.   Cardiovascular: Negative.   Genitourinary: Negative.   Neurological: Negative.        Objective:   Physical Exam  Constitutional: She is oriented to person, place, and time. She appears well-developed and well-nourished.  Cardiovascular: Normal rate, regular rhythm and normal heart sounds.   Pulmonary/Chest: Effort normal and breath sounds normal.  Abdominal: Soft.  Neurological: She is alert and oriented to person, place, and time.  Psychiatric: She has a normal mood and affect. Thought content normal.          Assessment & Plan:  1. Hyperlipidemia Lipids today and stopped statin and repeat in 2 months - Lipid panel - Lipid panel; Future  2. Essential hypertension Blood pressures here are elevated but are normal at home. There may be some white coat effect but will compare her cuff to ours at her next visit - atenolol (TENORMIN) 25 MG tablet; Take 1 tablet (25 mg total) by mouth daily.  Dispense: 30 tablet; Refill: 11 - losartan (COZAAR) 25 MG tablet; Take 1 tablet (25 mg total) by mouth daily.  Dispense: 30 tablet; Refill: 11 - CMP14+EGFR  3. Postoperative hypothyroidism On thyroid replacement. Was last checked 1 year ago - Thyroid Panel  With TSH - levothyroxine (SYNTHROID, LEVOTHROID) 100 MCG tablet; Take 1 tablet (100 mcg total) by mouth daily.  Dispense: 30 tablet; Refill: 11  4. HLD (hyperlipidemia) See above for lipid discussion - simvastatin (ZOCOR) 10 MG tablet; Take 1 tablet (10 mg total) by mouth at bedtime.  Dispense: 30 tablet; Refill: 11  Wardell Honour MD

## 2015-02-13 LAB — LIPID PANEL
CHOLESTEROL TOTAL: 170 mg/dL (ref 100–199)
Chol/HDL Ratio: 2.9 ratio units (ref 0.0–4.4)
HDL: 59 mg/dL (ref >39)
LDL Calculated: 93 mg/dL (ref 0–99)
Triglycerides: 89 mg/dL (ref 0–149)
VLDL Cholesterol Cal: 18 mg/dL (ref 5–40)

## 2015-02-13 LAB — CMP14+EGFR
ALT: 18 IU/L (ref 0–32)
AST: 19 IU/L (ref 0–40)
Albumin/Globulin Ratio: 1.9 (ref 1.1–2.5)
Albumin: 4.4 g/dL (ref 3.5–4.8)
Alkaline Phosphatase: 51 IU/L (ref 39–117)
BUN/Creatinine Ratio: 19 (ref 11–26)
BUN: 19 mg/dL (ref 8–27)
Bilirubin Total: 0.7 mg/dL (ref 0.0–1.2)
CALCIUM: 9.8 mg/dL (ref 8.7–10.3)
CO2: 24 mmol/L (ref 18–29)
Chloride: 103 mmol/L (ref 97–108)
Creatinine, Ser: 1.01 mg/dL — ABNORMAL HIGH (ref 0.57–1.00)
GFR, EST AFRICAN AMERICAN: 65 mL/min/{1.73_m2} (ref >59)
GFR, EST NON AFRICAN AMERICAN: 57 mL/min/{1.73_m2} — AB (ref >59)
GLOBULIN, TOTAL: 2.3 g/dL (ref 1.5–4.5)
Glucose: 94 mg/dL (ref 65–99)
Potassium: 4.6 mmol/L (ref 3.5–5.2)
SODIUM: 141 mmol/L (ref 134–144)
TOTAL PROTEIN: 6.7 g/dL (ref 6.0–8.5)

## 2015-02-13 LAB — THYROID PANEL WITH TSH
Free Thyroxine Index: 3.4 (ref 1.2–4.9)
T3 Uptake Ratio: 29 % (ref 24–39)
T4, Total: 11.8 ug/dL (ref 4.5–12.0)
TSH: 0.992 u[IU]/mL (ref 0.450–4.500)

## 2015-02-28 ENCOUNTER — Ambulatory Visit (INDEPENDENT_AMBULATORY_CARE_PROVIDER_SITE_OTHER): Payer: Medicare Other | Admitting: *Deleted

## 2015-02-28 VITALS — BP 171/90

## 2015-02-28 DIAGNOSIS — I10 Essential (primary) hypertension: Secondary | ICD-10-CM

## 2015-02-28 NOTE — Progress Notes (Signed)
Pt here for BP check today.

## 2015-03-03 ENCOUNTER — Ambulatory Visit
Admission: RE | Admit: 2015-03-03 | Discharge: 2015-03-03 | Disposition: A | Discharge status: Home / Self Care | Payer: Medicare Other | Source: Ambulatory Visit

## 2015-03-03 DIAGNOSIS — Z1231 Encounter for screening mammogram for malignant neoplasm of breast: Secondary | ICD-10-CM | POA: Diagnosis not present

## 2015-03-03 IMAGING — MG MM SCREENING BREAST TOMO UNI L
6 series · 6 of 18 positions shown · non-contrast
Comparison: Previous exam(s).

CLINICAL DATA: Screening.

EXAM:
DIGITAL SCREENING UNILATERAL LEFT MAMMOGRAM WITH TOMO AND CAD

[L CC (1 of 2)]
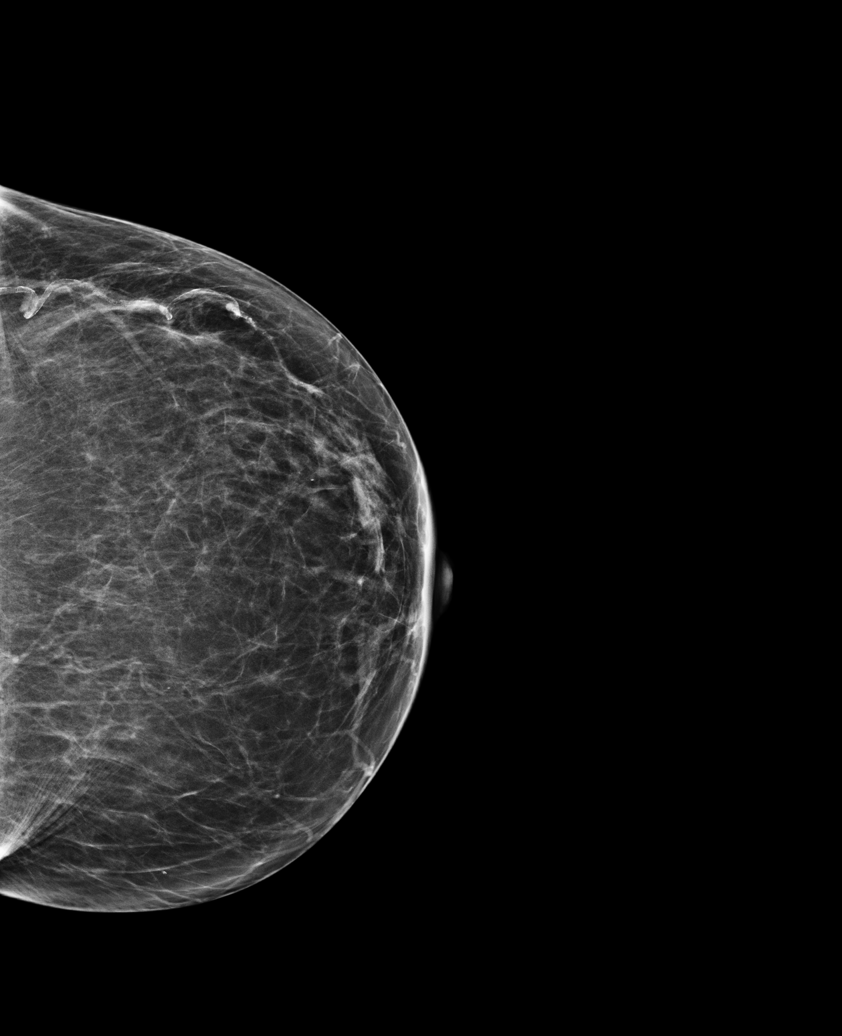

[L MLO]
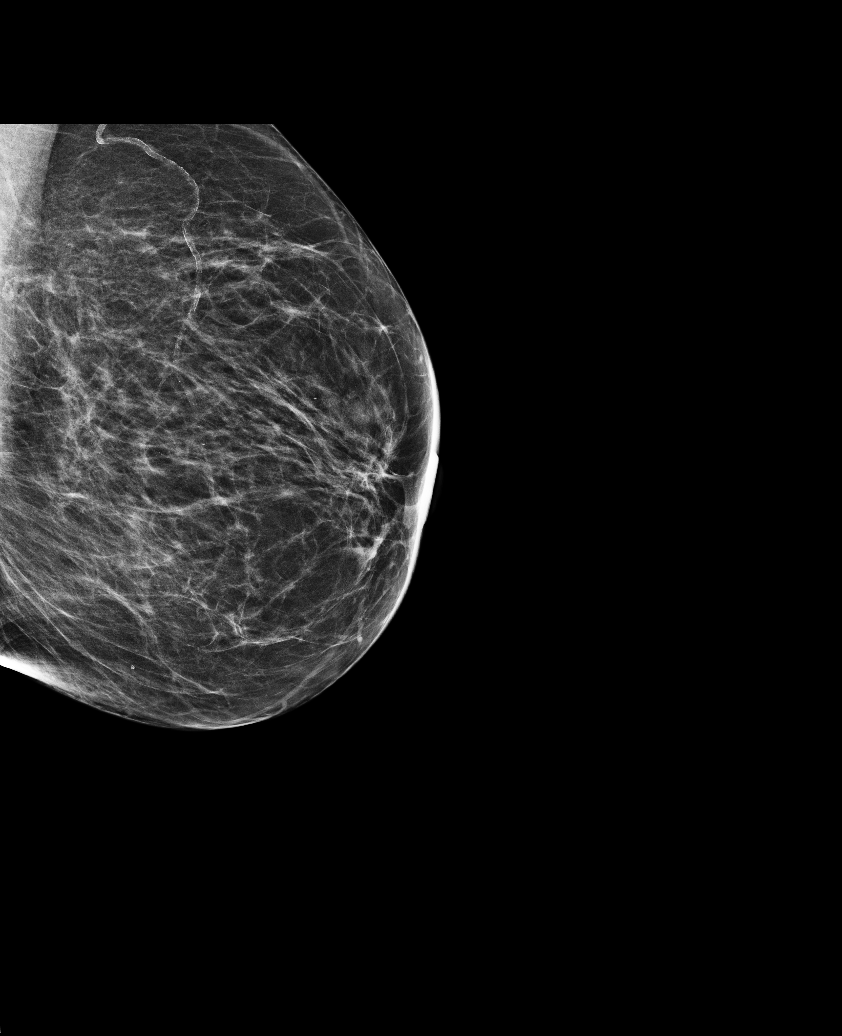

[L CC (2 of 2)]
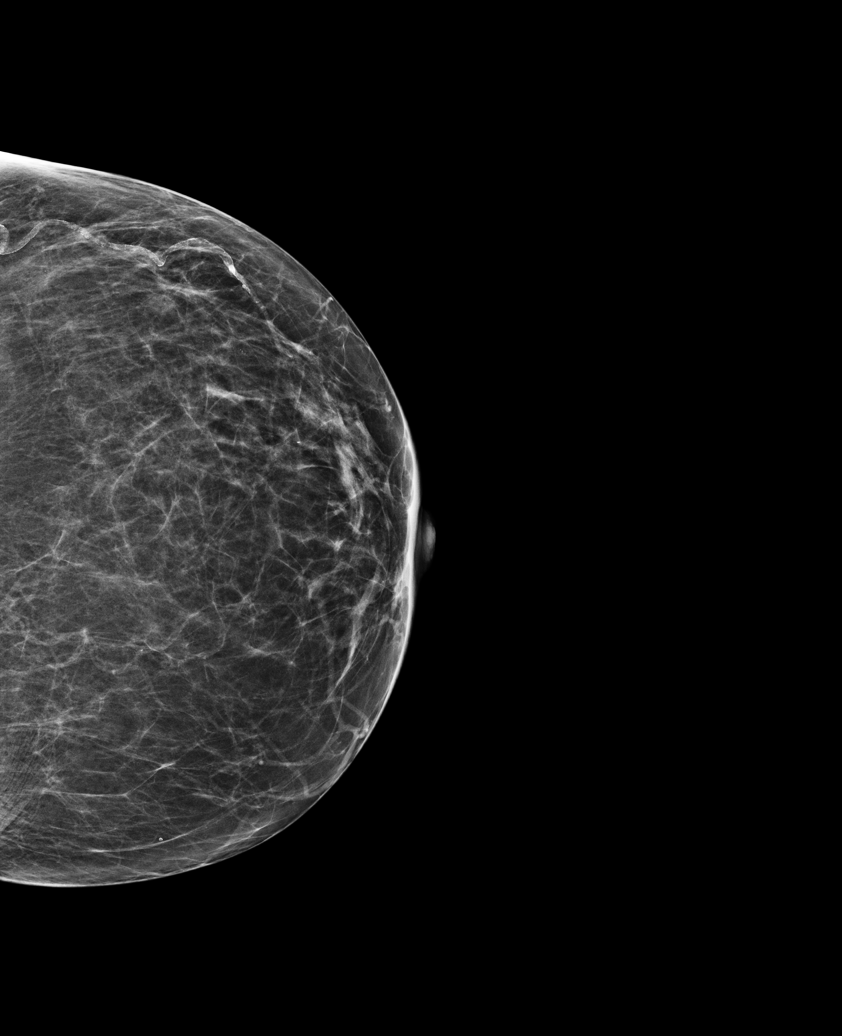

[L CC tomo (1 of 2) · tomo slice 31/60.0]
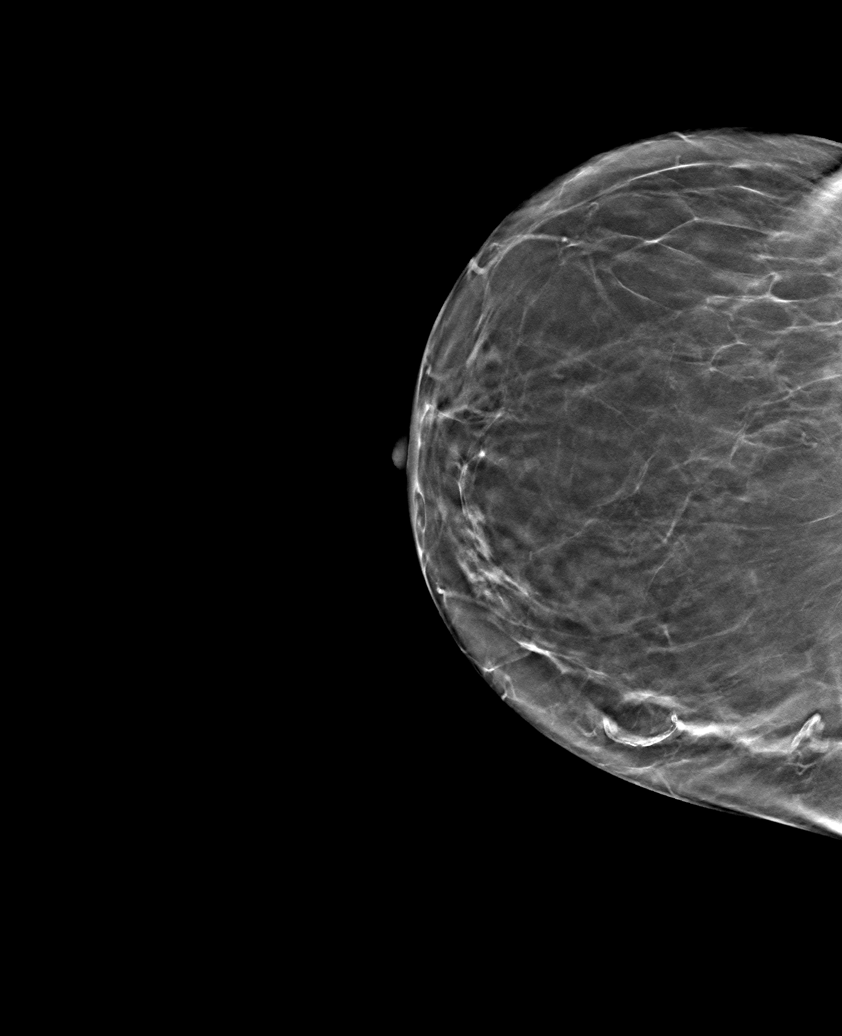

[L MLO tomo · tomo slice 31/61.0]
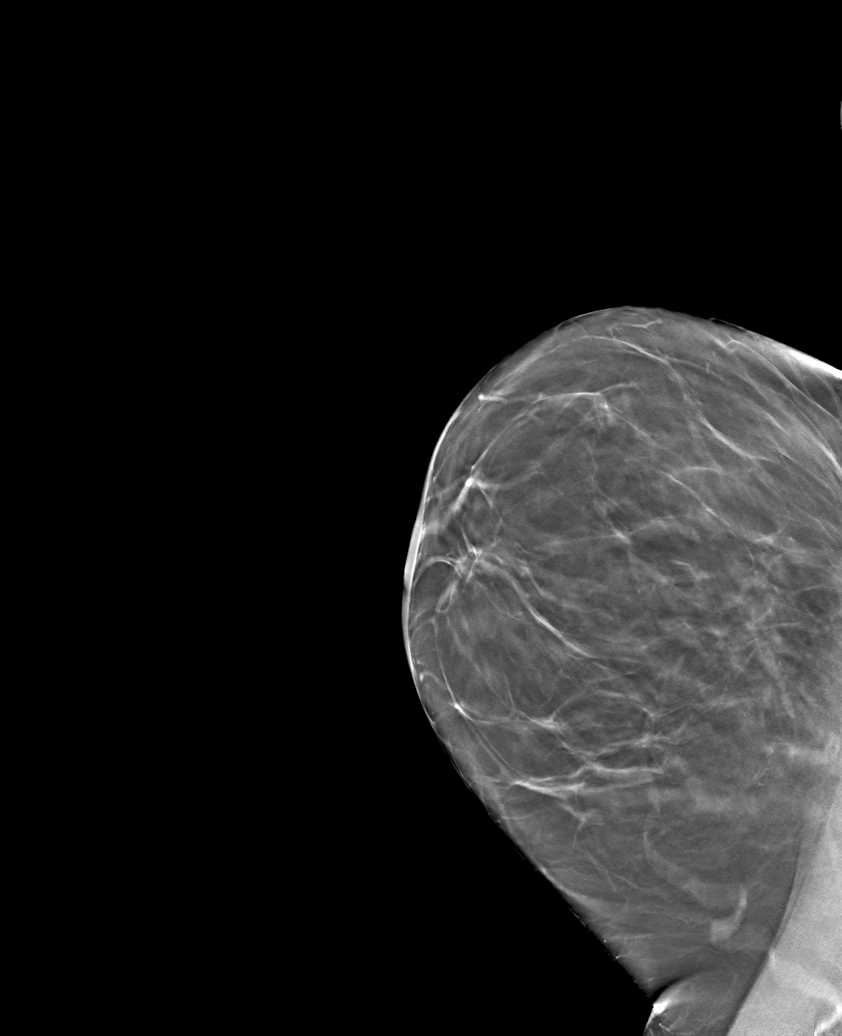

[L CC tomo (2 of 2) · tomo slice 27/54.0]
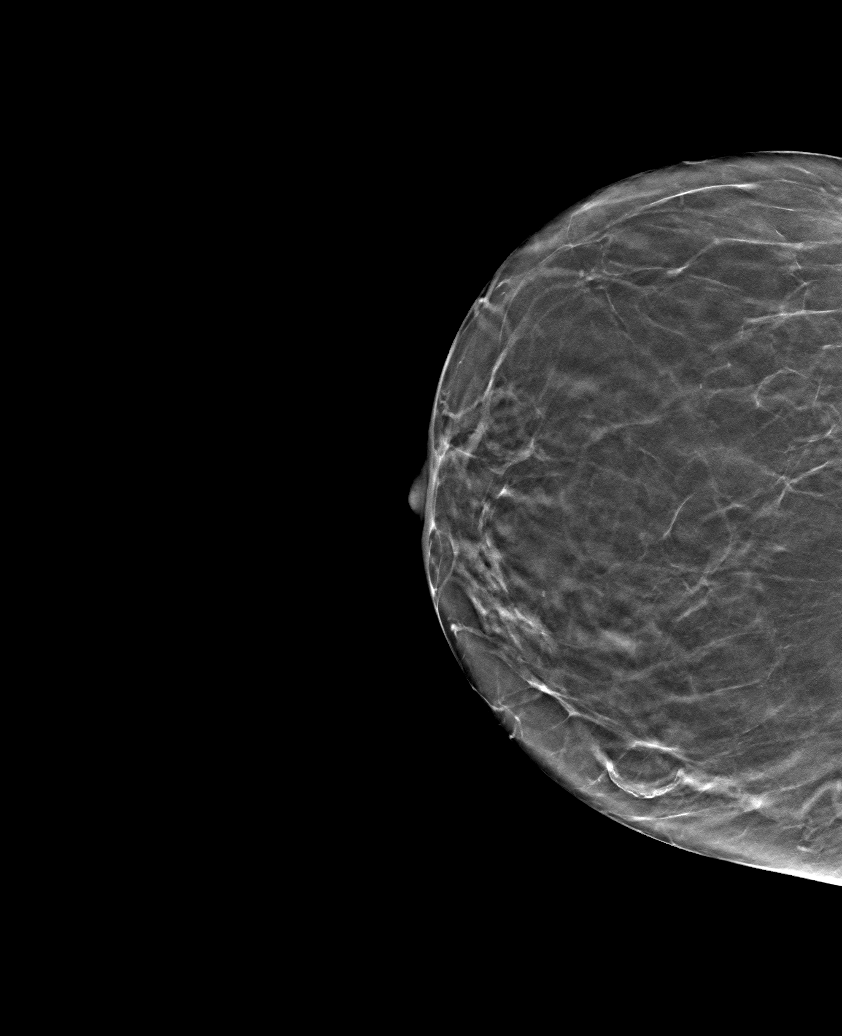

[6 of 18 positions shown; findings below may reference images not displayed]

ACR Breast Density Category b: There are scattered areas of
fibroglandular density.
FINDINGS: There are no findings suspicious for malignancy. Images were
processed with CAD.
IMPRESSION: No mammographic evidence of malignancy. A result letter of this
screening mammogram will be mailed directly to the patient.

RECOMMENDATION:
Screening mammogram in one year. (Code:[H5])

BI-RADS CATEGORY  1: Negative.

## 2015-03-11 ENCOUNTER — Telehealth: Payer: Self-pay | Admitting: *Deleted

## 2015-03-11 NOTE — Telephone Encounter (Signed)
After reviewing her blood pressure readings, I recommend she stay on same medication

## 2015-03-12 NOTE — Telephone Encounter (Signed)
Pt aware of Dr. Ammie Ferrier recommendations

## 2015-04-08 ENCOUNTER — Encounter: Payer: Self-pay | Admitting: Family Medicine

## 2015-04-08 ENCOUNTER — Ambulatory Visit (INDEPENDENT_AMBULATORY_CARE_PROVIDER_SITE_OTHER): Payer: Medicare Other | Admitting: Family Medicine

## 2015-04-08 VITALS — BP 132/84 | HR 66 | Temp 97.4°F | Ht 66.5 in | Wt 144.0 lb

## 2015-04-08 DIAGNOSIS — J209 Acute bronchitis, unspecified: Secondary | ICD-10-CM | POA: Diagnosis not present

## 2015-04-08 DIAGNOSIS — R059 Cough, unspecified: Secondary | ICD-10-CM

## 2015-04-08 DIAGNOSIS — R05 Cough: Secondary | ICD-10-CM

## 2015-04-08 MED ORDER — AZITHROMYCIN 250 MG PO TABS: ORAL_TABLET | ORAL | Status: DC

## 2015-04-08 NOTE — Patient Instructions (Signed)
Drink plenty of fluids and stay well hydrated Keep the house as cool as possible Use a cool mist humidifier especially in the bedroom at nighttime Take Tylenol for aches pains and fever Use Mucinex maximum strength, blue and white in color, 1 twice a day with a large glass of water Use nasal saline in each nostril 3 or 4 times daily
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
## 2015-04-16 ENCOUNTER — Other Ambulatory Visit (INDEPENDENT_AMBULATORY_CARE_PROVIDER_SITE_OTHER): Payer: Medicare Other

## 2015-04-16 DIAGNOSIS — E785 Hyperlipidemia, unspecified: Secondary | ICD-10-CM | POA: Diagnosis not present

## 2015-04-16 NOTE — Progress Notes (Signed)
Lab only 

## 2015-04-17 LAB — LIPID PANEL
CHOL/HDL RATIO: 4.3 ratio (ref 0.0–4.4)
Cholesterol, Total: 222 mg/dL — ABNORMAL HIGH (ref 100–199)
HDL: 52 mg/dL (ref >39)
LDL CALC: 143 mg/dL — AB (ref 0–99)
Triglycerides: 133 mg/dL (ref 0–149)
VLDL CHOLESTEROL CAL: 27 mg/dL (ref 5–40)

## 2015-08-13 ENCOUNTER — Encounter (INDEPENDENT_AMBULATORY_CARE_PROVIDER_SITE_OTHER): Payer: Self-pay

## 2015-08-13 ENCOUNTER — Ambulatory Visit (INDEPENDENT_AMBULATORY_CARE_PROVIDER_SITE_OTHER): Payer: Medicare Other | Admitting: Family Medicine

## 2015-08-13 ENCOUNTER — Encounter: Payer: Self-pay | Admitting: Family Medicine

## 2015-08-13 VITALS — BP 187/95 | Temp 96.7°F | Ht 66.5 in | Wt 144.0 lb

## 2015-08-13 DIAGNOSIS — I10 Essential (primary) hypertension: Secondary | ICD-10-CM | POA: Diagnosis not present

## 2015-08-13 DIAGNOSIS — E785 Hyperlipidemia, unspecified: Secondary | ICD-10-CM | POA: Diagnosis not present

## 2015-08-13 LAB — CMP14+EGFR
A/G RATIO: 1.8 (ref 1.2–2.2)
ALT: 15 IU/L (ref 0–32)
AST: 20 IU/L (ref 0–40)
Albumin: 4.3 g/dL (ref 3.5–4.8)
Alkaline Phosphatase: 57 IU/L (ref 39–117)
BUN/Creatinine Ratio: 16 (ref 12–28)
BUN: 16 mg/dL (ref 8–27)
Bilirubin Total: 0.5 mg/dL (ref 0.0–1.2)
CALCIUM: 9.8 mg/dL (ref 8.7–10.3)
CO2: 26 mmol/L (ref 18–29)
CREATININE: 0.99 mg/dL (ref 0.57–1.00)
Chloride: 102 mmol/L (ref 96–106)
GFR, EST AFRICAN AMERICAN: 67 mL/min/{1.73_m2} (ref >59)
GFR, EST NON AFRICAN AMERICAN: 58 mL/min/{1.73_m2} — AB (ref >59)
GLUCOSE: 86 mg/dL (ref 65–99)
Globulin, Total: 2.4 g/dL (ref 1.5–4.5)
Potassium: 4.8 mmol/L (ref 3.5–5.2)
Sodium: 142 mmol/L (ref 134–144)
TOTAL PROTEIN: 6.7 g/dL (ref 6.0–8.5)

## 2015-08-13 LAB — LIPID PANEL
CHOL/HDL RATIO: 4.2 ratio (ref 0.0–4.4)
Cholesterol, Total: 246 mg/dL — ABNORMAL HIGH (ref 100–199)
HDL: 58 mg/dL (ref >39)
LDL CALC: 166 mg/dL — AB (ref 0–99)
TRIGLYCERIDES: 108 mg/dL (ref 0–149)
VLDL CHOLESTEROL CAL: 22 mg/dL (ref 5–40)

## 2015-08-13 NOTE — Progress Notes (Signed)
   Subjective:    Patient ID: April Weber, female    DOB: Aug 10, 1945, 70 y.o.   MRN: [id number]  HPI 70-year-old female with history of hypertension, hyperlipidemia, and breast cancer. She is not followed anymore for breast cancer except for annual mammograms. She monitors her blood pressure at home and pressures have averaged 364 systolic. His seems they're always higher here, probably related to the white coat syndrome. We had stopped her statin at her request. When last checked LDL had gone up from 193-143. She now thinks that maybe she should go back on statin. Will check again today, but her numbers and risk assessment. When looked at need for statin using the risk assessment formula she has a 12.5% 10 year risk which would indicate need for statin.  Patient Active Problem List   Diagnosis Date Noted  . HLD (hyperlipidemia) 02/13/2013  . Need for prophylactic vaccination and inoculation against influenza 02/13/2013  . Hypothyroidism 08/04/2012  . Osteopenia 08/12/2010  . Breast CA ([place]) 08/12/2010  . Hyperlipidemia 08/12/2010  . HTN (hypertension) 08/12/2010   Outpatient Encounter Prescriptions as of 08/13/2015  Medication Sig  . aspirin 81 MG EC tablet Take 81 mg by mouth daily.    Marland Kitchen atenolol (TENORMIN) 25 MG tablet Take 1 tablet (25 mg total) by mouth daily.  . Calcium 600-200 MG-UNIT per tablet Take 1 tablet by mouth 2 (two) times daily.   . fish oil-omega-3 fatty acids 1000 MG capsule Take 1 g by mouth daily. Takes 1 tab '1400mg'$   . levothyroxine (SYNTHROID, LEVOTHROID) 100 MCG tablet Take 1 tablet (100 mcg total) by mouth daily.  Marland Kitchen losartan (COZAAR) 25 MG tablet Take 1 tablet (25 mg total) by mouth daily.  . Misc. Devices (ILLUSIONS C BREAST PROSTHESIS) MISC Use as needed. Dx:C50.919  . Misc. Devices MISC Mastectomy bras (right mastectomy). Use as needed. Dx:C50.919  . Multiple Vitamin (MULTIVITAMIN) capsule Take 1 capsule by mouth daily.    . [DISCONTINUED] azithromycin  (ZITHROMAX) 250 MG tablet 2 pills the first day then one daily for infection until completed   No facility-administered encounter medications on file as of 08/13/2015.      Review of Systems  Constitutional: Negative.   HENT: Negative.   Eyes: Negative.   Respiratory: Negative.   Cardiovascular: Negative.   Gastrointestinal: Negative.   Endocrine: Negative.   Genitourinary: Negative.   Hematological: Negative.   Psychiatric/Behavioral: Negative.        Objective:   Physical Exam  Constitutional: She is oriented to person, place, and time. She appears well-developed and well-nourished.  Cardiovascular: Normal rate, regular rhythm, normal heart sounds and intact distal pulses.   Pulmonary/Chest: Effort normal and breath sounds normal.  Neurological: She is alert and oriented to person, place, and time.  Psychiatric: She has a normal mood and affect. Her behavior is normal.          Assessment & Plan:  1. Essential hypertension The blood pressure is elevated here numbers at home have been good. No changes are recommended she is on low-dose atenolol and losartan and these doses had been reduced when she lost weight. - CMP14+EGFR  2. Hyperlipidemia Monitor lipids today may need to go back on statin depending on where LDL is today. - Lipid panel  Wardell Honour MD

## 2015-08-15 ENCOUNTER — Other Ambulatory Visit: Payer: Self-pay | Admitting: *Deleted

## 2015-08-15 ENCOUNTER — Telehealth: Payer: Self-pay | Admitting: *Deleted

## 2015-08-15 MED ORDER — ATORVASTATIN CALCIUM 10 MG PO TABS: 10.0000 mg | ORAL_TABLET | Freq: Every day | ORAL | Status: DC

## 2015-08-15 MED ORDER — SIMVASTATIN 10 MG PO TABS: 10.0000 mg | ORAL_TABLET | Freq: Every day | ORAL | Status: DC

## 2015-08-15 NOTE — Telephone Encounter (Signed)
Received phone call from patient stating that she would like to take what she was previously taking for cholesterol which is simvastatin 10 mg.  Rx sent to [facility]

## 2015-11-27 DIAGNOSIS — I83813 Varicose veins of bilateral lower extremities with pain: Secondary | ICD-10-CM | POA: Diagnosis not present

## 2015-12-05 ENCOUNTER — Telehealth: Payer: Self-pay | Admitting: Family Medicine

## 2015-12-16 ENCOUNTER — Other Ambulatory Visit: Payer: Self-pay | Admitting: Family Medicine

## 2015-12-16 DIAGNOSIS — Z780 Asymptomatic menopausal state: Secondary | ICD-10-CM

## 2015-12-17 ENCOUNTER — Ambulatory Visit (INDEPENDENT_AMBULATORY_CARE_PROVIDER_SITE_OTHER): Payer: Medicare Other

## 2015-12-17 ENCOUNTER — Ambulatory Visit: Payer: Medicare Other

## 2015-12-17 DIAGNOSIS — Z780 Asymptomatic menopausal state: Secondary | ICD-10-CM | POA: Diagnosis not present

## 2015-12-17 IMAGING — CR DG DXA BONE DENSITY STUDY HL7
1 series · 1 of 1 positions shown · non-contrast
Comparison: none

[dxa images]
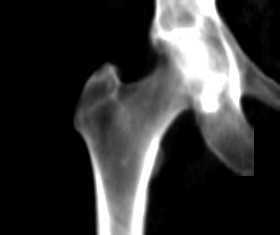

[1 of 1 positions shown; findings below may reference images not displayed]

Canned report from images found in remote index.

Refer to host system for actual result text.

## 2015-12-30 ENCOUNTER — Telehealth: Payer: Self-pay | Admitting: Family Medicine

## 2015-12-31 NOTE — Telephone Encounter (Signed)
Spoke with patient about DEXA results

## 2016-01-06 DIAGNOSIS — S46811A Strain of other muscles, fascia and tendons at shoulder and upper arm level, right arm, initial encounter: Secondary | ICD-10-CM | POA: Diagnosis not present

## 2016-01-09 ENCOUNTER — Other Ambulatory Visit: Payer: Self-pay

## 2016-01-16 DIAGNOSIS — S46811D Strain of other muscles, fascia and tendons at shoulder and upper arm level, right arm, subsequent encounter: Secondary | ICD-10-CM | POA: Diagnosis not present

## 2016-01-22 DIAGNOSIS — S46811D Strain of other muscles, fascia and tendons at shoulder and upper arm level, right arm, subsequent encounter: Secondary | ICD-10-CM | POA: Diagnosis not present

## 2016-02-06 DIAGNOSIS — M75121 Complete rotator cuff tear or rupture of right shoulder, not specified as traumatic: Secondary | ICD-10-CM | POA: Diagnosis not present

## 2016-02-06 DIAGNOSIS — M7541 Impingement syndrome of right shoulder: Secondary | ICD-10-CM | POA: Diagnosis not present

## 2016-02-06 DIAGNOSIS — S46001A Unspecified injury of muscle(s) and tendon(s) of the rotator cuff of right shoulder, initial encounter: Secondary | ICD-10-CM | POA: Diagnosis not present

## 2016-02-06 DIAGNOSIS — G8918 Other acute postprocedural pain: Secondary | ICD-10-CM | POA: Diagnosis not present

## 2016-02-06 DIAGNOSIS — M19011 Primary osteoarthritis, right shoulder: Secondary | ICD-10-CM | POA: Diagnosis not present

## 2016-02-06 DIAGNOSIS — M659 Synovitis and tenosynovitis, unspecified: Secondary | ICD-10-CM | POA: Diagnosis not present

## 2016-02-06 DIAGNOSIS — S43431A Superior glenoid labrum lesion of right shoulder, initial encounter: Secondary | ICD-10-CM | POA: Diagnosis not present

## 2016-02-14 ENCOUNTER — Other Ambulatory Visit: Payer: Self-pay | Admitting: Family Medicine

## 2016-02-14 DIAGNOSIS — I10 Essential (primary) hypertension: Secondary | ICD-10-CM

## 2016-02-14 DIAGNOSIS — E890 Postprocedural hypothyroidism: Secondary | ICD-10-CM

## 2016-02-16 DIAGNOSIS — Z4789 Encounter for other orthopedic aftercare: Secondary | ICD-10-CM | POA: Diagnosis not present

## 2016-02-16 DIAGNOSIS — S46811D Strain of other muscles, fascia and tendons at shoulder and upper arm level, right arm, subsequent encounter: Secondary | ICD-10-CM | POA: Diagnosis not present

## 2016-02-24 ENCOUNTER — Ambulatory Visit: Payer: Medicare Other | Attending: Orthopedic Surgery | Admitting: Physical Therapy

## 2016-02-24 DIAGNOSIS — M25612 Stiffness of left shoulder, not elsewhere classified: Secondary | ICD-10-CM | POA: Diagnosis not present

## 2016-02-24 DIAGNOSIS — M25512 Pain in left shoulder: Secondary | ICD-10-CM | POA: Diagnosis not present

## 2016-02-24 NOTE — Therapy (Signed)
[facility] [place], [place], [address] Phone: [phone]   Fax:  [phone]  Physical Therapy Treatment  Patient Details  Name: April Weber MRN: [id number] Date of Birth: 1946-01-22 Referring Provider: Justice Britain MD  Encounter Date: 02/24/2016      PT End of Session - 02/24/16 1656    Visit Number 1   Number of Visits 12   Date for PT Re-Evaluation 04/06/16   PT Start Time 0947   PT Stop Time 1036   PT Time Calculation (min) 49 min   Activity Tolerance Patient tolerated treatment well   Behavior During Therapy [facility] for tasks assessed/performed      Past Medical History:  Diagnosis Date  . Allergy    seasonal  . Chronic kidney disease    has one kidney  . Hyperlipidemia   . Hypertension   . Osteopenia   . Thyroid disease     Past Surgical History:  Procedure Laterality Date  . ABDOMINAL HYSTERECTOMY    . APPENDECTOMY    . EYE SURGERY     70 years old  . MASTECTOMY Right 1984  . THYROID SURGERY      There were no vitals filed for this visit.      Subjective Assessment - 02/24/16 1650    Subjective The patient reports she fell in her yard on 01/02/16 and sustained a right shoulder injury.  She underwent a right shoulder RTC repair surgery on 02/06/16.  She hasbeen performing pendulum exercises thus far.  Pain is rated at a 6/10 today at reat and higher (7+/10) when she first come out of her sling prior to performing the pendulum.  She also reports associated pain into her right neck region.   Patient Stated Goals Get back to normal function.   Currently in Pain? Yes   Pain Score 6    Pain Location Shoulder   Pain Orientation Right   Pain Descriptors / Indicators Aching   Pain Type Surgical pain   Pain Onset 1 to 4 weeks ago   Pain Frequency Constant   Aggravating Factors  Movement.   Pain Relieving Factors "Not sure."            [facility] PT Assessment - 02/24/16 0001      Assessment   Medical Diagnosis Right rotator cuff repair.   Referring Provider Justice Britain MD   Onset Date/Surgical Date --  03/15/16.     Precautions   Precautions --  RT shoulder PROM.     Restrictions   Weight Bearing Restrictions No     Balance Screen   Has the patient fallen in the past 6 months Yes   How many times? --  1.   Has the patient had a decrease in activity level because of a fear of falling?  No   Is the patient reluctant to leave their home because of a fear of falling?  No     Home Environment   Living Environment Private residence     Prior Function   Level of Independence Independent     Observation/Other Assessments   Observations Ecchymotic area near acromion process (middle deltoid region).     ROM / Strength   AROM / PROM / Strength PROM     PROM   Overall PROM Comments In supine right shoulder PROM into flexion= 70 degrees, ER is -30 degrees from neutral and IR to abdomen.     Palpation   Palpation comment C/o  diffuse right shoulder tenderness currently.     Ambulation/Gait   Gait Comments WNL.                     [facility] Adult PT Treatment/Exercise - 02/24/16 0001      Exercises   Exercises Shoulder     Shoulder Exercises: Supine   Other Supine Exercises Went through ther ex program with patient to be part of her HEP.  This included review of pendulum exercise, supine cane exercise to increase right shoulder ER passively and also standing countertop passive stretch to increase flexion.  We went over this in great detail and pictures were also provided for the patient.  She demonstrated excellent knowledge.     Modalities   Modalities Electrical Stimulation;Moist Heat     Moist Heat Therapy   Number Minutes Moist Heat 15 Minutes   Moist Heat Location --  RT shoulder.     Acupuncturist Location RT shoulder.   Electrical Stimulation Action Non-motoric IFC at 100% scan at 80-150 Hz x 15 minutes.    Electrical Stimulation Goals Pain                PT Education - 02/24/16 1656    Education provided Yes   Person(s) Educated Patient   Methods Explanation;Demonstration;Tactile cues;Verbal cues;Handout   Comprehension Verbalized understanding;Returned demonstration;Verbal cues required;Tactile cues required             PT Long Term Goals - 02/24/16 1746      PT LONG TERM GOAL #1   Title Independent with HEP.   Time 4   Period Weeks   Status New     PT LONG TERM GOAL #2   Title Active shoulder flexion to 145 degrees so the patient can easily reach overhead.   Time 4   Period Weeks   Status New     PT LONG TERM GOAL #3   Title Active ER to 70 degrees+ to allow for easily donning/doffing of apparel.   Time 4   Period Weeks   Status New     PT LONG TERM GOAL #4   Title Increase ROM so patient is able to reach behind back to L4.   Time 4   Period Weeks   Status New     PT LONG TERM GOAL #5   Title Increase right shoulder strength to a solid 4+/5 to increase stability for performance of functional activities   Time 4   Period Weeks   Status New               Plan - 02/24/16 1658    Clinical Impression Statement The patient presents to the clinic today in a right shoulder sling with an abduction pillow.  She is currently doing the pendulum exercise at home.  She is still in quite a lot of pain and presents with significant losses of right shoulder PROM (especially ER).   Rehab Potential Good   PT Frequency 3x / week   PT Duration 4 weeks   PT Treatment/Interventions ADLs/Self Care Home Management;Cryotherapy;Electrical Stimulation;Therapeutic activities;Therapeutic exercise;Patient/family education;Passive range of motion;Manual lymph drainage   PT Next Visit Plan RT shoulder PROM currently.  Will progress after patient sees surgeon on 03/15/16.      Patient will benefit from skilled therapeutic intervention in order to improve the following  deficits and impairments:  Pain, Decreased activity tolerance, Decreased range of motion  Visit Diagnosis: Acute pain of left  shoulder  Stiffness of left shoulder, not elsewhere classified       G-Codes - 02-27-16 1701    Functional Assessment Tool Used FOTO...96% limitation.   Functional Limitation Carrying, moving and handling objects   Carrying, Moving and Handling Objects Current Status [phone]) At least 34 percent but less than 100 percent impaired, limited or restricted   Carrying, Moving and Handling Objects Goal Status [id number]) At least 1 percent but less than 20 percent impaired, limited or restricted      Problem List Patient Active Problem List   Diagnosis Date Noted  . HLD (hyperlipidemia) 02/13/2013  . Need for prophylactic vaccination and inoculation against influenza 02/13/2013  . Hypothyroidism 08/04/2012  . Osteopenia 08/12/2010  . Breast CA ([place]) 08/12/2010  . Hyperlipidemia 08/12/2010  . HTN (hypertension) 08/12/2010    Moreen Piggott, [place] MPT 02/27/16, 5:56 PM  [facility] [address] [place], [place], [address] Phone: [phone]   Fax:  [phone]  Name: April Weber MRN: [id number] Date of Birth: [birth year]

## 2016-03-02 ENCOUNTER — Ambulatory Visit: Payer: Medicare Other | Admitting: *Deleted

## 2016-03-02 DIAGNOSIS — M25612 Stiffness of left shoulder, not elsewhere classified: Secondary | ICD-10-CM

## 2016-03-02 DIAGNOSIS — M25512 Pain in left shoulder: Secondary | ICD-10-CM | POA: Diagnosis not present

## 2016-03-02 NOTE — Therapy (Signed)
[facility] [place], [place], [address] Phone: [phone]   Fax:  [phone]  Physical Therapy Treatment  Patient Details  Name: ZULLY MEST MRN: [id number] Date of Birth: May 16, 1945 Referring Provider: Justice Britain MD  Encounter Date: 03/02/2016      PT End of Session - 03/02/16 0953    Visit Number 2   Number of Visits 12   Date for PT Re-Evaluation 04/06/16   PT Start Time 0945   PT Stop Time 1037   PT Time Calculation (min) 52 min      Past Medical History:  Diagnosis Date  . Allergy    seasonal  . Chronic kidney disease    has one kidney  . Hyperlipidemia   . Hypertension   . Osteopenia   . Thyroid disease     Past Surgical History:  Procedure Laterality Date  . ABDOMINAL HYSTERECTOMY    . APPENDECTOMY    . EYE SURGERY     70 years old  . MASTECTOMY Right 1984  . THYROID SURGERY      There were no vitals filed for this visit.      Subjective Assessment - 03/02/16 0949    Subjective The patient reports she fell in her yard on 01/02/16 and sustained a right shoulder injury.  She underwent a right shoulder RTC repair surgery on 02/06/16.  She hasbeen performing pendulum exercises thus far.  Pain is rated at a 6/10 today at reat and higher (7+/10) when she first come out of her sling prior to performing the pendulum.  She also reports associated pain into her right neck region.   Pertinent History H/o lymphedema of right UE.   Patient Stated Goals Get back to normal function.   Currently in Pain? Yes   Pain Score 5    Pain Location Shoulder   Pain Orientation Right   Pain Descriptors / Indicators Aching   Pain Type Surgical pain   Pain Onset 1 to 4 weeks ago   Pain Frequency Constant                         [facility] Adult PT Treatment/Exercise - 03/02/16 0001      Exercises   Exercises Shoulder     Modalities   Modalities Electrical Stimulation;Moist Heat     Moist  Heat Therapy   Number Minutes Moist Heat 15 Minutes   Moist Heat Location Shoulder     Electrical Stimulation   Electrical Stimulation Location RT shoulder. IFC 80-150hz  x 15 mins in inclined position   Electrical Stimulation Goals Pain     Manual Therapy   Manual Therapy Passive ROM   Passive ROM PROM to RT shldr in an inclined position for flexion and er with gentle end-range holds                     PT Long Term Goals - 02/24/16 1746      PT LONG TERM GOAL #1   Title Independent with HEP.   Time 4   Period Weeks   Status New     PT LONG TERM GOAL #2   Title Active shoulder flexion to 145 degrees so the patient can easily reach overhead.   Time 4   Period Weeks   Status New     PT LONG TERM GOAL #3   Title Active ER to 70 degrees+ to allow for easily donning/doffing of apparel.  Time 4   Period Weeks   Status New     PT LONG TERM GOAL #4   Title Increase ROM so patient is able to reach behind back to L4.   Time 4   Period Weeks   Status New     PT LONG TERM GOAL #5   Title Increase right shoulder strength to a solid 4+/5 to increase stability for performance of functional activities   Time 4   Period Weeks   Status New               Plan - 03/02/16 [id number]    Clinical Impression Statement The pt did fair with Rx today. She was very guarded and had many questions about her shldr and how much should it hurt and if she is doing enough or too much. She was able to reach PROM for flexion of 110 degrees and ER to Zero. She was advised to continue with HEP given by MPT until MD F/U   Rehab Potential Good   PT Frequency 3x / week   PT Duration 4 weeks   PT Treatment/Interventions ADLs/Self Care Home Management;Cryotherapy;Electrical Stimulation;Therapeutic activities;Therapeutic exercise;Patient/family education;Passive range of motion;Manual lymph drainage   PT Next Visit Plan RT shoulder PROM currently.  Will progress after patient sees surgeon on  03/15/16.   Consulted and Agree with Plan of Care Patient      Patient will benefit from skilled therapeutic intervention in order to improve the following deficits and impairments:  Pain, Decreased activity tolerance, Decreased range of motion  Visit Diagnosis: Acute pain of left shoulder  Stiffness of left shoulder, not elsewhere classified     Problem List Patient Active Problem List   Diagnosis Date Noted  . HLD (hyperlipidemia) 02/13/2013  . Need for prophylactic vaccination and inoculation against influenza 02/13/2013  . Hypothyroidism 08/04/2012  . Osteopenia 08/12/2010  . Breast CA ([place]) 08/12/2010  . Hyperlipidemia 08/12/2010  . HTN (hypertension) 08/12/2010    Damian Hofstra,CHRIS, PTA 03/02/2016, 11:23 AM  [facility] [address] [place], [place], [address] Phone: [phone]   Fax:  [phone]  Name: SHIRELL PELZ MRN: [id number] Date of Birth: 1945-09-14

## 2016-03-03 ENCOUNTER — Telehealth: Payer: Self-pay | Admitting: Family Medicine

## 2016-03-03 NOTE — Telephone Encounter (Signed)
Patient needs follow up apt. Apt made

## 2016-03-04 ENCOUNTER — Ambulatory Visit: Payer: Medicare Other

## 2016-03-05 ENCOUNTER — Ambulatory Visit (INDEPENDENT_AMBULATORY_CARE_PROVIDER_SITE_OTHER): Payer: Medicare Other | Admitting: Family Medicine

## 2016-03-05 ENCOUNTER — Encounter: Payer: Self-pay | Admitting: Family Medicine

## 2016-03-05 VITALS — BP 154/88 | HR 65 | Temp 97.9°F | Ht 66.5 in | Wt 144.0 lb

## 2016-03-05 DIAGNOSIS — I10 Essential (primary) hypertension: Secondary | ICD-10-CM | POA: Diagnosis not present

## 2016-03-05 DIAGNOSIS — Z23 Encounter for immunization: Secondary | ICD-10-CM

## 2016-03-05 DIAGNOSIS — E7800 Pure hypercholesterolemia, unspecified: Secondary | ICD-10-CM

## 2016-03-05 DIAGNOSIS — E890 Postprocedural hypothyroidism: Secondary | ICD-10-CM | POA: Diagnosis not present

## 2016-03-05 MED ORDER — ATENOLOL 25 MG PO TABS: 25.0000 mg | ORAL_TABLET | Freq: Every day | ORAL | 1 refills | Status: DC

## 2016-03-05 MED ORDER — LOSARTAN POTASSIUM 25 MG PO TABS: 25.0000 mg | ORAL_TABLET | Freq: Every day | ORAL | 1 refills | Status: DC

## 2016-03-05 MED ORDER — LEVOTHYROXINE SODIUM 100 MCG PO TABS
100.0000 ug | ORAL_TABLET | Freq: Every day | ORAL | 1 refills | Status: DC
Start: 2016-03-05 — End: 2016-07-26

## 2016-03-05 NOTE — Progress Notes (Signed)
Subjective:    Patient ID: April Weber, female    DOB: [birth year], 70 y.o.   MRN: [id number]  HPI   Patient is here today for 6 month followup of chronic medical problems including hypertension, hyperlipidemia and hypothyroidism. Patient had started back on atorvastatin after a time off. Her LDL had gone up and she wanted to restart it so we started back at 10 mg. She is here today to follow-up that as well as check her blood pressure and get flu shot.   Patient Active Problem List   Diagnosis Date Noted  . HLD (hyperlipidemia) 02/13/2013  . Need for prophylactic vaccination and inoculation against influenza 02/13/2013  . Hypothyroidism 08/04/2012  . Osteopenia 08/12/2010  . Breast CA ([place]) 08/12/2010  . Hyperlipidemia 08/12/2010  . HTN (hypertension) 08/12/2010   Outpatient Encounter Prescriptions as of 03/05/2016  Medication Sig  . aspirin 81 MG EC tablet Take 81 mg by mouth daily.    Marland Kitchen atenolol (TENORMIN) 25 MG tablet Take 1 tablet (25 mg total) by mouth daily.  . Calcium 600-200 MG-UNIT per tablet Take 1 tablet by mouth 2 (two) times daily.   . fish oil-omega-3 fatty acids 1000 MG capsule Take 1 g by mouth daily. Takes 1 tab 1472m  . levothyroxine (SYNTHROID, LEVOTHROID) 100 MCG tablet Take 1 tablet (100 mcg total) by mouth daily.  .Marland Kitchenlosartan (COZAAR) 25 MG tablet Take 1 tablet (25 mg total) by mouth daily.  . Misc. Devices (ILLUSIONS C BREAST PROSTHESIS) MISC Use as needed. Dx:C50.919  . Misc. Devices MISC Mastectomy bras (right mastectomy). Use as needed. Dx:C50.919  . Multiple Vitamin (MULTIVITAMIN) capsule Take 1 capsule by mouth daily.    . simvastatin (ZOCOR) 10 MG tablet Take 1 tablet (10 mg total) by mouth at bedtime.  . [DISCONTINUED] atenolol (TENORMIN) 25 MG tablet TAKE ONE TABLET BY MOUTH ONCE DAILY  . [DISCONTINUED] atorvastatin (LIPITOR) 10 MG tablet Take 1 tablet (10 mg total) by mouth daily.  . [DISCONTINUED] levothyroxine (SYNTHROID, LEVOTHROID) 100 MCG  tablet TAKE ONE TABLET BY MOUTH ONCE DAILY  . [DISCONTINUED] losartan (COZAAR) 25 MG tablet TAKE ONE TABLET BY MOUTH ONCE DAILY   No facility-administered encounter medications on file as of 03/05/2016.       Review of Systems  Constitutional: Negative.   HENT: Negative.   Eyes: Negative.   Respiratory: Negative.   Cardiovascular: Negative.   Gastrointestinal: Negative.   Endocrine: Negative.   Genitourinary: Negative.   Musculoskeletal: Negative.   Skin: Negative.   Allergic/Immunologic: Negative.   Neurological: Negative.   Hematological: Negative.   Psychiatric/Behavioral: Negative.        Objective:   Physical Exam  Constitutional: She appears well-developed and well-nourished.  Cardiovascular: Normal rate and regular rhythm.   Pulmonary/Chest: Effort normal and breath sounds normal.  Musculoskeletal:  Right arm in a sling after rotator cuff surgery. She will be starting physical therapy in the near future  Neurological: She is alert.     BP (!) 154/88   Pulse 65   Temp 97.9 F (36.6 C) (Oral)   Ht 5' 6.5" (1.689 m)   Wt 144 lb (65.3 kg)   BMI 22.89 kg/m          Assessment & Plan:  1. Essential hypertension Pressure has improved. She tends to have elevated pressures when she sees a provider but it is normally controlled - CMP14+EGFR - atenolol (TENORMIN) 25 MG tablet; Take 1 tablet (25 mg total) by mouth daily.  Dispense:  90 tablet; Refill: 1 - losartan (COZAAR) 25 MG tablet; Take 1 tablet (25 mg total) by mouth daily.  Dispense: 90 tablet; Refill: 1  2. Pure hypercholesterolemia Rechecking after restarting atorvastatin - CMP14+EGFR - Lipid panel  3. Postoperative hypothyroidism Need to monitor thyroid TSH levels - levothyroxine (SYNTHROID, LEVOTHROID) 100 MCG tablet; Take 1 tablet (100 mcg total) by mouth daily.  Disse: 90 tablet; Refill: 1 - Thyroid Panel With TSH  Wardell Honour MD

## 2016-03-05 NOTE — Patient Instructions (Signed)
Medicare Annual Wellness Visit  [place] and the medical providers at [facility] strive to bring you the best medical care.  In doing so we not only want to address your current medical conditions and concerns but also to detect new conditions early and prevent illness, disease and health-related problems.    Medicare offers a yearly Wellness Visit which allows our clinical staff to assess your need for preventative services including immunizations, lifestyle education, counseling to decrease risk of preventable diseases and screening for fall risk and other medical concerns.    This visit is provided free of charge (no copay) for all Medicare recipients. The clinical pharmacists at [facility] have begun to conduct these Wellness Visits which will also include a thorough review of all your medications.    As you primary medical provider recommend that you make an appointment for your Annual Wellness Visit if you have not done so already this year.  You may set up this appointment before you leave today or you may call back ([phone]) and schedule an appointment.  Please make sure when you call that you mention that you are scheduling your Annual Wellness Visit with the clinical pharmacist so that the appointment may be made for the proper length of time.     Continue current medications. Continue good therapeutic lifestyle changes which include good diet and exercise. Fall precautions discussed with patient. If an FOBT was given today- please return it to our front desk. If you are over 50 years old - you may need Prevnar 13 or the adult Pneumonia vaccine.  After your visit with us today you will receive a survey in the mail or online from [employer] regarding your care with us. Please take a moment to fill this out. Your feedback is very important to us as you can help us better understand your patient needs as well as improve  your experience and satisfaction. WE CARE ABOUT YOU!!!    

## 2016-03-05 NOTE — Progress Notes (Signed)
   Subjective:    Patient ID: April Weber, female    DOB: 02-06-1946, 70 y.o.   MRN: [id number]  HPI    Review of Systems     Objective:   Physical Exam        Assessment & Plan:

## 2016-03-06 LAB — THYROID PANEL WITH TSH
Free Thyroxine Index: 2.9 (ref 1.2–4.9)
T3 UPTAKE RATIO: 28 % (ref 24–39)
T4 TOTAL: 10.5 ug/dL (ref 4.5–12.0)
TSH: 0.945 u[IU]/mL (ref 0.450–4.500)

## 2016-03-06 LAB — CMP14+EGFR
ALBUMIN: 4.3 g/dL (ref 3.5–4.8)
ALT: 18 IU/L (ref 0–32)
AST: 21 IU/L (ref 0–40)
Albumin/Globulin Ratio: 1.8 (ref 1.2–2.2)
Alkaline Phosphatase: 72 IU/L (ref 39–117)
BUN / CREAT RATIO: 21 (ref 12–28)
BUN: 18 mg/dL (ref 8–27)
Bilirubin Total: 0.7 mg/dL (ref 0.0–1.2)
CALCIUM: 10 mg/dL (ref 8.7–10.3)
CO2: 26 mmol/L (ref 18–29)
CREATININE: 0.86 mg/dL (ref 0.57–1.00)
Chloride: 99 mmol/L (ref 96–106)
GFR calc non Af Amer: 68 mL/min/{1.73_m2} (ref >59)
GFR, EST AFRICAN AMERICAN: 79 mL/min/{1.73_m2} (ref >59)
GLUCOSE: 89 mg/dL (ref 65–99)
Globulin, Total: 2.4 g/dL (ref 1.5–4.5)
Potassium: 4.6 mmol/L (ref 3.5–5.2)
Sodium: 141 mmol/L (ref 134–144)
TOTAL PROTEIN: 6.7 g/dL (ref 6.0–8.5)

## 2016-03-06 LAB — LIPID PANEL
CHOL/HDL RATIO: 3.1 ratio (ref 0.0–4.4)
Cholesterol, Total: 178 mg/dL (ref 100–199)
HDL: 58 mg/dL (ref >39)
LDL Calculated: 103 mg/dL — ABNORMAL HIGH (ref 0–99)
TRIGLYCERIDES: 87 mg/dL (ref 0–149)
VLDL Cholesterol Cal: 17 mg/dL (ref 5–40)

## 2016-03-15 DIAGNOSIS — S46811D Strain of other muscles, fascia and tendons at shoulder and upper arm level, right arm, subsequent encounter: Secondary | ICD-10-CM | POA: Diagnosis not present

## 2016-03-15 DIAGNOSIS — Z4789 Encounter for other orthopedic aftercare: Secondary | ICD-10-CM | POA: Diagnosis not present

## 2016-03-16 ENCOUNTER — Ambulatory Visit: Payer: Medicare Other | Attending: Orthopedic Surgery | Admitting: Physical Therapy

## 2016-03-16 DIAGNOSIS — M25612 Stiffness of left shoulder, not elsewhere classified: Secondary | ICD-10-CM | POA: Diagnosis not present

## 2016-03-16 DIAGNOSIS — M25512 Pain in left shoulder: Secondary | ICD-10-CM | POA: Diagnosis not present

## 2016-03-16 NOTE — Therapy (Signed)
[facility] [place], [place], [address] Phone: [phone]   Fax:  [phone]  Physical Therapy Treatment  Patient Details  Name: April Weber MRN: [id number] Date of Birth: 04-15-1946 Referring Provider: Justice Britain MD  Encounter Date: 03/16/2016      PT End of Session - 03/16/16 1255    Visit Number 3   Number of Visits 12   Date for PT Re-Evaluation 04/06/16   PT Start Time 0945   PT Stop Time [id number]   PT Time Calculation (min) 62 min   Activity Tolerance Patient tolerated treatment well   Behavior During Therapy [facility] for tasks assessed/performed      Past Medical History:  Diagnosis Date  . Allergy    seasonal  . Chronic kidney disease    has one kidney  . Hyperlipidemia   . Hypertension   . Osteopenia   . Thyroid disease     Past Surgical History:  Procedure Laterality Date  . ABDOMINAL HYSTERECTOMY    . APPENDECTOMY    . EYE SURGERY     70 years old  . MASTECTOMY Right 1984  . THYROID SURGERY      There were no vitals filed for this visit.      Subjective Assessment - 03/16/16 1258    Subjective I got a new protocol to follow.  Please look under "Media" tab.   Patient Stated Goals Get back to normal function.   Pain Score 5    Pain Location Shoulder   Pain Orientation Right   Pain Descriptors / Indicators Aching   Pain Type Surgical pain   Pain Onset 1 to 4 weeks ago                         [facility] Adult PT Treatment/Exercise - 03/16/16 0001      Electrical Stimulation   Electrical Stimulation Location RT SHOULDER.   Electrical Stimulation Action NON-MOTORIC IFC X 15 MINS.   Electrical Stimulation Goals Pain     Manual Therapy   Manual Therapy Passive ROM   Passive ROM PROM X 38 MINS INTO RIGHT SHOULDER FLEXION AND ER.                     PT Long Term Goals - 02/24/16 1746      PT LONG TERM GOAL #1   Title Independent with HEP.   Time 4   Period Weeks   Status New     PT LONG TERM GOAL #2   Title Active shoulder flexion to 145 degrees so the patient can easily reach overhead.   Time 4   Period Weeks   Status New     PT LONG TERM GOAL #3   Title Active ER to 70 degrees+ to allow for easily donning/doffing of apparel.   Time 4   Period Weeks   Status New     PT LONG TERM GOAL #4   Title Increase ROM so patient is able to reach behind back to L4.   Time 4   Period Weeks   Status New     PT LONG TERM GOAL #5   Title Increase right shoulder strength to a solid 4+/5 to increase stability for performance of functional activities   Time 4   Period Weeks   Status New             Patient will benefit from skilled therapeutic intervention in  order to improve the following deficits and impairments:  Pain, Decreased activity tolerance, Decreased range of motion  Visit Diagnosis: Acute pain of left shoulder  Stiffness of left shoulder, not elsewhere classified     Problem List Patient Active Problem List   Diagnosis Date Noted  . HLD (hyperlipidemia) 02/13/2013  . Need for prophylactic vaccination and inoculation against influenza 02/13/2013  . Hypothyroidism 08/04/2012  . Osteopenia 08/12/2010  . Breast CA ([place]) 08/12/2010  . Hyperlipidemia 08/12/2010  . HTN (hypertension) 08/12/2010    APPLEGATE, [place] MPT 03/16/2016, 1:17 PM  [facility] [address] [place], [place], [address] Phone: [phone]   Fax:  [phone]  Name: April Weber MRN: [id number] Date of Birth: 10-09-1945

## 2016-03-19 ENCOUNTER — Ambulatory Visit: Payer: Medicare Other | Admitting: Physical Therapy

## 2016-03-19 DIAGNOSIS — M25612 Stiffness of left shoulder, not elsewhere classified: Secondary | ICD-10-CM

## 2016-03-19 DIAGNOSIS — M25512 Pain in left shoulder: Secondary | ICD-10-CM

## 2016-03-19 NOTE — Therapy (Signed)
[facility] [place], [place], [address] Phone: [phone]   Fax:  [phone]  Physical Therapy Treatment  Patient Details  Name: April Weber MRN: [id number] Date of Birth: 1945-06-24 Referring Provider: Justice Britain MD  Encounter Date: 03/19/2016      PT End of Session - 03/19/16 1124    Activity Tolerance Patient tolerated treatment well   Behavior During Therapy [facility] for tasks assessed/performed      Past Medical History:  Diagnosis Date  . Allergy    seasonal  . Chronic kidney disease    has one kidney  . Hyperlipidemia   . Hypertension   . Osteopenia   . Thyroid disease     Past Surgical History:  Procedure Laterality Date  . ABDOMINAL HYSTERECTOMY    . APPENDECTOMY    . EYE SURGERY     70 years old  . MASTECTOMY Right 1984  . THYROID SURGERY      There were no vitals filed for this visit.      Subjective Assessment - 03/19/16 1110    Subjective I lifted my arm out to the side and felt a "pop."  The patient knows she is not to actively move her right shoulder at this time. Ok to start AAROM per protocol.  Please refer to protocol under "Media" tab.   Pertinent History H/o lymphedema of right UE.   Patient Stated Goals Get back to normal function.   Pain Score 5    Pain Location Shoulder   Pain Orientation Right   Pain Descriptors / Indicators Aching   Pain Type Surgical pain   Pain Onset 1 to 4 weeks ago   Pain Frequency Constant    Treatment:  Right shoulder PROM into flexion and ER x 30 minutes f/b non-motoric e'stim at 80-150 Hz x 20 minutes.  Excellent job today.                                  PT Long Term Goals - 02/24/16 1746      PT LONG TERM GOAL #1   Title Independent with HEP.   Time 4   Period Weeks   Status New     PT LONG TERM GOAL #2   Title Active shoulder flexion to 145 degrees so the patient can easily reach overhead.   Time 4   Period Weeks   Status New     PT LONG TERM GOAL #3   Title Active ER to 70 degrees+ to allow for easily donning/doffing of apparel.   Time 4   Period Weeks   Status New     PT LONG TERM GOAL #4   Title Increase ROM so patient is able to reach behind back to L4.   Time 4   Period Weeks   Status New     PT LONG TERM GOAL #5   Title Increase right shoulder strength to a solid 4+/5 to increase stability for performance of functional activities   Time 4   Period Weeks   Status New             Patient will benefit from skilled therapeutic intervention in order to improve the following deficits and impairments:  Pain, Decreased activity tolerance, Decreased range of motion  Visit Diagnosis: Acute pain of left shoulder  Stiffness of left shoulder, not elsewhere classified     Problem List Patient Active  Problem List   Diagnosis Date Noted  . HLD (hyperlipidemia) 02/13/2013  . Need for prophylactic vaccination and inoculation against influenza 02/13/2013  . Hypothyroidism 08/04/2012  . Osteopenia 08/12/2010  . Breast CA ([place]) 08/12/2010  . Hyperlipidemia 08/12/2010  . HTN (hypertension) 08/12/2010    Atiba Kimberlin, [place] MPT 03/19/2016, 11:26 AM  [facility] [address] [place], [place], [address] Phone: [phone]   Fax:  [phone]  Name: JILLYN ARTUSO MRN: [id number] Date of Birth: 1945/12/30

## 2016-03-23 ENCOUNTER — Encounter: Payer: Self-pay | Admitting: Physical Therapy

## 2016-03-23 ENCOUNTER — Ambulatory Visit: Payer: Medicare Other | Admitting: Physical Therapy

## 2016-03-23 DIAGNOSIS — M25612 Stiffness of left shoulder, not elsewhere classified: Secondary | ICD-10-CM | POA: Diagnosis not present

## 2016-03-23 DIAGNOSIS — M25512 Pain in left shoulder: Secondary | ICD-10-CM

## 2016-03-23 NOTE — Therapy (Signed)
[facility] [place], [place], [address] Phone: [phone]   Fax:  [phone]  Physical Therapy Treatment  Patient Details  Name: April Weber MRN: [id number] Date of Birth: 1946/04/20 Referring Provider: Justice Britain MD  Encounter Date: 03/23/2016      PT End of Session - 03/23/16 0941    Visit Number 5   Number of Visits 12   Date for PT Re-Evaluation 04/06/16   PT Start Time 0946   PT Stop Time 1034   PT Time Calculation (min) 48 min   Activity Tolerance Patient tolerated treatment well   Behavior During Therapy [facility] for tasks assessed/performed      Past Medical History:  Diagnosis Date  . Allergy    seasonal  . Chronic kidney disease    has one kidney  . Hyperlipidemia   . Hypertension   . Osteopenia   . Thyroid disease     Past Surgical History:  Procedure Laterality Date  . ABDOMINAL HYSTERECTOMY    . APPENDECTOMY    . EYE SURGERY     70 years old  . MASTECTOMY Right 1984  . THYROID SURGERY      There were no vitals filed for this visit.      Subjective Assessment - 03/23/16 0941    Subjective Reports soreness on Saturday following previous treatment. Reports that she took pain medication prior to therapy today.   Pertinent History H/o lymphedema of right UE.   Patient Stated Goals Get back to normal function.   Currently in Pain? Yes   Pain Score 4    Pain Location Shoulder   Pain Orientation Right   Pain Descriptors / Indicators Aching   Pain Type Surgical pain   Pain Onset 1 to 4 weeks ago   Pain Frequency Constant            [facility] PT Assessment - 03/23/16 0001      Assessment   Medical Diagnosis Right rotator cuff repair.   Onset Date/Surgical Date 02/06/16   Next MD Visit 04/12/2016     Restrictions   Weight Bearing Restrictions No                     [facility] Adult PT Treatment/Exercise - 03/23/16 0001      Shoulder Exercises: Supine   Flexion  AAROM;Both;20 reps  reclined with small white bolster and pillows     Shoulder Exercises: Pulleys   Flexion Other (comment)  x5 mn   Other Pulley Exercises Seated LUE ranger flex/ CW and CCW circles x30 reps     Shoulder Exercises: ROM/Strengthening   Other ROM/Strengthening Exercises RUE table ABCs with ball x1 rep     Modalities   Modalities Electrical Stimulation;Moist Heat     Moist Heat Therapy   Number Minutes Moist Heat 15 Minutes   Moist Heat Location Shoulder     Electrical Stimulation   Electrical Stimulation Location R shoulder   Electrical Stimulation Action IFC   Electrical Stimulation Parameters 1-10 hz x15 min   Electrical Stimulation Goals Pain     Manual Therapy   Manual Therapy Passive ROM   Passive ROM PROM of R shoulder into flex/ER/IR with gentle holds at end range                     PT Long Term Goals - 02/24/16 1746      PT LONG TERM GOAL #1   Title Independent  with HEP.   Time 4   Period Weeks   Status New     PT LONG TERM GOAL #2   Title Active shoulder flexion to 145 degrees so the patient can easily reach overhead.   Time 4   Period Weeks   Status New     PT LONG TERM GOAL #3   Title Active ER to 70 degrees+ to allow for easily donning/doffing of apparel.   Time 4   Period Weeks   Status New     PT LONG TERM GOAL #4   Title Increase ROM so patient is able to reach behind back to L4.   Time 4   Period Weeks   Status New     PT LONG TERM GOAL #5   Title Increase right shoulder strength to a solid 4+/5 to increase stability for performance of functional activities   Time 4   Period Weeks   Status New               Plan - 03/23/16 1024    Clinical Impression Statement Patient tolerated today's treatment well and was introduced to AAROM exercises per protocol. Patient able to complete AAROM with VCs and demonstration for proper technique and limitations in regards to pain with shoulder exercises. Patient  educated to continue AAROM reviewed in clinic at home at least three times per day. Firm end feels noted with PROM of R shoulder into flex/ER/IR with smooth arc of motion noted as well. Patient continues to experience R UT muscle spasms at times but not as intense as they were previously. Normal modalities response noted following removal of the modalities. Patient encouraged to continue new  AAROM exercises and previous HEP to continue progression of treatment.   Rehab Potential Good   PT Frequency 3x / week   PT Duration 4 weeks   PT Treatment/Interventions ADLs/Self Care Home Management;Cryotherapy;Electrical Stimulation;Therapeutic activities;Therapeutic exercise;Patient/family education;Passive range of motion;Manual lymph drainage   PT Next Visit Plan Cotninue per protocol in media with AAROM exercises and modalities PRN per MPT POC.   Consulted and Agree with Plan of Care Patient      Patient will benefit from skilled therapeutic intervention in order to improve the following deficits and impairments:  Pain, Decreased activity tolerance, Decreased range of motion  Visit Diagnosis: Acute pain of left shoulder  Stiffness of left shoulder, not elsewhere classified     Problem List Patient Active Problem List   Diagnosis Date Noted  . HLD (hyperlipidemia) 02/13/2013  . Need for prophylactic vaccination and inoculation against influenza 02/13/2013  . Hypothyroidism 08/04/2012  . Osteopenia 08/12/2010  . Breast CA ([place]) 08/12/2010  . Hyperlipidemia 08/12/2010  . HTN (hypertension) 08/12/2010    [name], PTA 03/23/2016, 10:40 AM  [facility] [address] [place], [place], [address] Phone: [phone]   Fax:  [phone]  Name: April Weber MRN: [id number] Date of Birth: [birth year]

## 2016-03-26 ENCOUNTER — Encounter: Payer: Self-pay | Admitting: Physical Therapy

## 2016-03-26 ENCOUNTER — Ambulatory Visit: Payer: Medicare Other | Admitting: Physical Therapy

## 2016-03-26 DIAGNOSIS — M25512 Pain in left shoulder: Secondary | ICD-10-CM | POA: Diagnosis not present

## 2016-03-26 DIAGNOSIS — M25612 Stiffness of left shoulder, not elsewhere classified: Secondary | ICD-10-CM | POA: Diagnosis not present

## 2016-03-26 NOTE — Therapy (Signed)
[facility] [place], [place], [address] Phone: [phone]   Fax:  [phone]  Physical Therapy Treatment  Patient Details  Name: April Weber MRN: [id number] Date of Birth: 03/10/1946 Referring Provider: Justice Britain MD  Encounter Date: 03/26/2016      PT End of Session - 03/26/16 0948    Visit Number 6   Number of Visits 12   Date for PT Re-Evaluation 04/06/16   PT Start Time 0946   PT Stop Time 1032   PT Time Calculation (min) 46 min   Activity Tolerance Patient tolerated treatment well   Behavior During Therapy [facility] for tasks assessed/performed      Past Medical History:  Diagnosis Date  . Allergy    seasonal  . Chronic kidney disease    has one kidney  . Hyperlipidemia   . Hypertension   . Osteopenia   . Thyroid disease     Past Surgical History:  Procedure Laterality Date  . ABDOMINAL HYSTERECTOMY    . APPENDECTOMY    . EYE SURGERY     70 years old  . MASTECTOMY Right 1984  . THYROID SURGERY      There were no vitals filed for this visit.      Subjective Assessment - 03/26/16 0948    Subjective Reports a different hurt now that is like muscle soreness instead of pain.   Pertinent History H/o lymphedema of right UE.   Patient Stated Goals Get back to normal function.   Currently in Pain? Yes   Pain Score 3    Pain Location Shoulder   Pain Orientation Right   Pain Descriptors / Indicators Sore   Pain Type Surgical pain   Pain Onset 1 to 4 weeks ago            [facility] PT Assessment - 03/26/16 0001      Assessment   Medical Diagnosis Right rotator cuff repair.   Onset Date/Surgical Date 02/06/16   Next MD Visit 04/12/2016     Restrictions   Weight Bearing Restrictions No                     [facility] Adult PT Treatment/Exercise - 03/26/16 0001      Shoulder Exercises: Supine   Flexion AAROM;Both;20 reps  in reclined position     Shoulder Exercises: Pulleys   Flexion Other (comment)  x5 min   Other Pulley Exercises Standing UE ranger into flex/CW and CCW circles x20 reps each     Shoulder Exercises: ROM/Strengthening   Other ROM/Strengthening Exercises RUE table ABCs with ball x1 rep     Modalities   Modalities Electrical Stimulation;Moist Heat     Moist Heat Therapy   Number Minutes Moist Heat 15 Minutes   Moist Heat Location Shoulder     Electrical Stimulation   Electrical Stimulation Location R shoulder   Electrical Stimulation Action IFC   Electrical Stimulation Parameters 1-10 hz x15 min   Electrical Stimulation Goals Pain     Manual Therapy   Manual Therapy Passive ROM   Passive ROM PROM of R shoulder into flex/ER with gentle holds at end range                     PT Long Term Goals - 02/24/16 1746      PT LONG TERM GOAL #1   Title Independent with HEP.   Time 4   Period Weeks   Status  New     PT LONG TERM GOAL #2   Title Active shoulder flexion to 145 degrees so the patient can easily reach overhead.   Time 4   Period Weeks   Status New     PT LONG TERM GOAL #3   Title Active ER to 70 degrees+ to allow for easily donning/doffing of apparel.   Time 4   Period Weeks   Status New     PT LONG TERM GOAL #4   Title Increase ROM so patient is able to reach behind back to L4.   Time 4   Period Weeks   Status New     PT LONG TERM GOAL #5   Title Increase right shoulder strength to a solid 4+/5 to increase stability for performance of functional activities   Time 4   Period Weeks   Status New               Plan - 03/26/16 1019    Clinical Impression Statement Patient arrived to treatment well and arrived with low level R shoulder soreness. Patient able to continue AAROM exercises without complication or discomfort with minimal VCs for any questions due to HEP. Firm end feels noted with PROM of R shoulder into flexion and ER with greater limitation with ER. Smooth arc of motion noted with PROM  of R shoulder into flexion and ER. Normal modalities response noted following removal of the modalities. Patient educated to continue HEP as directed.   Rehab Potential Good   PT Frequency 3x / week   PT Duration 4 weeks   PT Treatment/Interventions ADLs/Self Care Home Management;Cryotherapy;Electrical Stimulation;Therapeutic activities;Therapeutic exercise;Patient/family education;Passive range of motion;Manual lymph drainage   PT Next Visit Plan Cotninue per protocol in media with AAROM exercises and modalities PRN per MPT POC.   Consulted and Agree with Plan of Care Patient      Patient will benefit from skilled therapeutic intervention in order to improve the following deficits and impairments:  Pain, Decreased activity tolerance, Decreased range of motion  Visit Diagnosis: Acute pain of left shoulder  Stiffness of left shoulder, not elsewhere classified     Problem List Patient Active Problem List   Diagnosis Date Noted  . HLD (hyperlipidemia) 02/13/2013  . Need for prophylactic vaccination and inoculation against influenza 02/13/2013  . Hypothyroidism 08/04/2012  . Osteopenia 08/12/2010  . Breast CA ([place]) 08/12/2010  . Hyperlipidemia 08/12/2010  . HTN (hypertension) 08/12/2010    April Weber, PTA 03/26/2016, 10:37 AM  [facility] [address] [place], [place], [address] Phone: [phone]   Fax:  [phone]  Name: April Weber MRN: [id number] Date of Birth: [birth year]

## 2016-03-29 ENCOUNTER — Encounter: Payer: Self-pay | Admitting: Physical Therapy

## 2016-03-29 ENCOUNTER — Ambulatory Visit: Payer: Medicare Other | Admitting: Physical Therapy

## 2016-03-29 DIAGNOSIS — M25612 Stiffness of left shoulder, not elsewhere classified: Secondary | ICD-10-CM | POA: Diagnosis not present

## 2016-03-29 DIAGNOSIS — M25512 Pain in left shoulder: Secondary | ICD-10-CM | POA: Diagnosis not present

## 2016-03-29 NOTE — Therapy (Signed)
[facility] [place], [place], [address] Phone: [phone]   Fax:  [phone]  Physical Therapy Treatment  Patient Details  Name: April Weber MRN: [id number] Date of Birth: Jun 30, 1945 Referring Provider: Justice Britain MD  Encounter Date: 03/29/2016      PT End of Session - 03/29/16 1010    Visit Number 7   Number of Visits 12   Date for PT Re-Evaluation 04/06/16   PT Start Time 0940   PT Stop Time 1030   PT Time Calculation (min) 50 min   Activity Tolerance Patient tolerated treatment well   Behavior During Therapy [facility] for tasks assessed/performed      Past Medical History:  Diagnosis Date  . Allergy    seasonal  . Chronic kidney disease    has one kidney  . Hyperlipidemia   . Hypertension   . Osteopenia   . Thyroid disease     Past Surgical History:  Procedure Laterality Date  . ABDOMINAL HYSTERECTOMY    . APPENDECTOMY    . EYE SURGERY     70 years old  . MASTECTOMY Right 1984  . THYROID SURGERY      There were no vitals filed for this visit.      Subjective Assessment - 03/29/16 0946    Subjective Reports it is still sore with with pulleys and exercise but more muscle soreness than pain. Curious when she can begin lifting and carrying items   Pertinent History H/o lymphedema of right UE.   Patient Stated Goals Get back to normal function.   Currently in Pain? Yes   Pain Score 4    Pain Location Shoulder   Pain Orientation Right   Pain Descriptors / Indicators Sore   Pain Type Surgical pain   Pain Onset 1 to 4 weeks ago                         [facility] Adult PT Treatment/Exercise - 03/29/16 0001      Shoulder Exercises: Seated   Retraction 20 reps   Other Seated Exercises bicep curl 1# x 20     Shoulder Exercises: Pulleys   Flexion --  5 mins   Other Pulley Exercises standing UE ranger flex, CW, CCW x 20 each     Shoulder Exercises: ROM/Strengthening   Other  ROM/Strengthening Exercises RUE table ABCs     Moist Heat Therapy   Number Minutes Moist Heat 15 Minutes   Moist Heat Location Shoulder     Electrical Stimulation   Electrical Stimulation Location Rt Shoulder   Electrical Stimulation Action IFC   Electrical Stimulation Parameters 1-10Hz  x 15 mins   Electrical Stimulation Goals Pain     Manual Therapy   Manual Therapy Passive ROM   Passive ROM PROM Rt shoulder into flexion and ER holding at end range                PT Education - 03/29/16 1009    Education provided Yes   Education Details bicep curls and scapular retraction added to HEP   Person(s) Educated Patient   Methods Explanation;Demonstration   Comprehension Verbalized understanding;Returned demonstration             PT Long Term Goals - 03/29/16 1023      PT LONG TERM GOAL #1   Title Independent with HEP.   Status On-going     PT LONG TERM GOAL #2   Title  Active shoulder flexion to 145 degrees so the patient can easily reach overhead.   Status On-going     PT LONG TERM GOAL #3   Title Active ER to 70 degrees+ to allow for easily donning/doffing of apparel.   Status On-going     PT LONG TERM GOAL #4   Title Increase ROM so patient is able to reach behind back to L4.   Status On-going     PT LONG TERM GOAL #5   Title Increase right shoulder strength to a solid 4+/5 to increase stability for performance of functional activities   Status On-going               Plan - 03/29/16 1022    Clinical Impression Statement Pt very motivated and performing HEP at home frequently. Pt without any pain with treatment, just muscle soreness. Pt able to add scapular and bicep exercises today   Rehab Potential Good   PT Frequency 3x / week   PT Duration 4 weeks   PT Treatment/Interventions ADLs/Self Care Home Management;Cryotherapy;Electrical Stimulation;Therapeutic activities;Therapeutic exercise;Patient/family education;Passive range of motion;Manual  lymph drainage   PT Next Visit Plan Cotninue per protocol in media with AAROM exercises and modalities PRN. Next MD visit 12/4   Consulted and Agree with Plan of Care Patient      Patient will benefit from skilled therapeutic intervention in order to improve the following deficits and impairments:  Pain, Decreased activity tolerance, Decreased range of motion  Visit Diagnosis: Acute pain of left shoulder  Stiffness of left shoulder, not elsewhere classified     Problem List Patient Active Problem List   Diagnosis Date Noted  . HLD (hyperlipidemia) 02/13/2013  . Need for prophylactic vaccination and inoculation against influenza 02/13/2013  . Hypothyroidism 08/04/2012  . Osteopenia 08/12/2010  . Breast CA ([place]) 08/12/2010  . Hyperlipidemia 08/12/2010  . HTN (hypertension) 08/12/2010    Isabelle Course, PT, DPT 03/29/2016, 10:25 AM  [facility] [address] [place], [place], [address] Phone: [phone]   Fax:  [phone]  Name: April Weber MRN: [id number] Date of Birth: 12/09/1945

## 2016-03-30 ENCOUNTER — Ambulatory Visit: Payer: Medicare Other | Admitting: Physical Therapy

## 2016-03-30 ENCOUNTER — Encounter: Payer: Self-pay | Admitting: Physical Therapy

## 2016-03-30 DIAGNOSIS — M25612 Stiffness of left shoulder, not elsewhere classified: Secondary | ICD-10-CM

## 2016-03-30 DIAGNOSIS — M25512 Pain in left shoulder: Secondary | ICD-10-CM | POA: Diagnosis not present

## 2016-03-30 NOTE — Therapy (Signed)
[facility] [place], [place], [address] Phone: [phone]   Fax:  [phone]  Physical Therapy Treatment  Patient Details  Name: April Weber MRN: [id number] Date of Birth: [birth year] Referring Provider: Justice Britain MD  Encounter Date: 03/30/2016      PT End of Session - 03/30/16 0940    Visit Number 8   Number of Visits 12   Date for PT Re-Evaluation 04/06/16   PT Start Time 0943   PT Stop Time 1033   PT Time Calculation (min) 50 min   Activity Tolerance Patient tolerated treatment well   Behavior During Therapy [facility] for tasks assessed/performed      Past Medical History:  Diagnosis Date  . Allergy    seasonal  . Chronic kidney disease    has one kidney  . Hyperlipidemia   . Hypertension   . Osteopenia   . Thyroid disease     Past Surgical History:  Procedure Laterality Date  . ABDOMINAL HYSTERECTOMY    . APPENDECTOMY    . EYE SURGERY     70 years old  . MASTECTOMY Right 1984  . THYROID SURGERY      There were no vitals filed for this visit.      Subjective Assessment - 03/30/16 0940    Subjective Reports soreness and fatigue but thinks that exercises help with pain.   Pertinent History H/o lymphedema of right UE.   Patient Stated Goals Get back to normal function.   Currently in Pain? Yes   Pain Score 2    Pain Location Shoulder   Pain Orientation Right   Pain Descriptors / Indicators Aching   Pain Type Surgical pain   Pain Onset 1 to 4 weeks ago   Pain Frequency Intermittent            [facility] PT Assessment - 03/30/16 0001      Assessment   Medical Diagnosis Right rotator cuff repair.   Onset Date/Surgical Date 02/06/16   Next MD Visit 04/12/2016     Restrictions   Weight Bearing Restrictions No                     [facility] Adult PT Treatment/Exercise - 03/30/16 0001      Shoulder Exercises: Supine   External Rotation AAROM;Right;20 reps;Other (comment)   Reclined against bolster with towel at elbow     Shoulder Exercises: Seated   Retraction 20 reps   Other Seated Exercises R Bicep curl 1# 3x10 reps     Shoulder Exercises: Standing   Other Standing Exercises Standing RUE table ABCs 4# ball x1 rep     Shoulder Exercises: Pulleys   Flexion Other (comment)  x5 min   Other Pulley Exercises standing UE ranger flex, CW, CCW x 20 each     Modalities   Modalities Electrical Stimulation;Moist Heat     Moist Heat Therapy   Number Minutes Moist Heat 15 Minutes   Moist Heat Location Shoulder     Electrical Stimulation   Electrical Stimulation Location Rt Shoulder   Electrical Stimulation Action IFC   Electrical Stimulation Parameters 1-10 hz x15 min   Electrical Stimulation Goals Pain     Manual Therapy   Manual Therapy Passive ROM   Passive ROM PROM Rt shoulder into flexion and ER holding at end range                PT Education - 03/29/16 1009  Education provided Yes   Education Details bicep curls and scapular retraction added to HEP   Person(s) Educated Patient   Methods Explanation;Demonstration   Comprehension Verbalized understanding;Returned demonstration             PT Long Term Goals - 03/29/16 1023      PT LONG TERM GOAL #1   Title Independent with HEP.   Status On-going     PT LONG TERM GOAL #2   Title Active shoulder flexion to 145 degrees so the patient can easily reach overhead.   Status On-going     PT LONG TERM GOAL #3   Title Active ER to 70 degrees+ to allow for easily donning/doffing of apparel.   Status On-going     PT LONG TERM GOAL #4   Title Increase ROM so patient is able to reach behind back to L4.   Status On-going     PT LONG TERM GOAL #5   Title Increase right shoulder strength to a solid 4+/5 to increase stability for performance of functional activities   Status On-going               Plan - 03/30/16 1020    Clinical Impression Statement Patient tolerated  today's treatment very well today although she only had minimal soreness in R shoulder. Patient able to complete exercises well with good technique and without reports of pain. Patient more limited with ER than flexion upon observation today. Firm end feels noted with PROM of R shoulder into both flexion and ER with tightness noted at very end feel of flexion per patient report. Normal modalities response noted following removal of the modalities.    Rehab Potential Good   PT Frequency 3x / week   PT Duration 4 weeks   PT Treatment/Interventions ADLs/Self Care Home Management;Cryotherapy;Electrical Stimulation;Therapeutic activities;Therapeutic exercise;Patient/family education;Passive range of motion;Manual lymph drainage   PT Next Visit Plan Cotninue per protocol in media with AAROM exercises and modalities PRN. Next MD visit 12/4   Consulted and Agree with Plan of Care Patient      Patient will benefit from skilled therapeutic intervention in order to improve the following deficits and impairments:  Pain, Decreased activity tolerance, Decreased range of motion  Visit Diagnosis: Acute pain of left shoulder  Stiffness of left shoulder, not elsewhere classified     Problem List Patient Active Problem List   Diagnosis Date Noted  . HLD (hyperlipidemia) 02/13/2013  . Need for prophylactic vaccination and inoculation against influenza 02/13/2013  . Hypothyroidism 08/04/2012  . Osteopenia 08/12/2010  . Breast CA ([place]) 08/12/2010  . Hyperlipidemia 08/12/2010  . HTN (hypertension) 08/12/2010    [name], PTA 03/30/2016, 10:38 AM  [facility] [address] [place], [place], [address] Phone: [phone]   Fax:  [phone]  Name: April Weber MRN: [id number] Date of Birth: 10/29/1945

## 2016-04-06 ENCOUNTER — Encounter: Payer: Self-pay | Admitting: Physical Therapy

## 2016-04-06 ENCOUNTER — Ambulatory Visit: Payer: Medicare Other | Admitting: Physical Therapy

## 2016-04-06 DIAGNOSIS — M25512 Pain in left shoulder: Secondary | ICD-10-CM

## 2016-04-06 DIAGNOSIS — M25612 Stiffness of left shoulder, not elsewhere classified: Secondary | ICD-10-CM

## 2016-04-06 NOTE — Therapy (Signed)
[facility] [place], [place], [address] Phone: [phone]   Fax:  [phone]  Physical Therapy Treatment  Patient Details  Name: April Weber MRN: [id number] Date of Birth: [birth year] Referring Provider: Justice Britain MD  Encounter Date: 04/06/2016      PT End of Session - 04/06/16 0948    Visit Number 9   Number of Visits 12   Date for PT Re-Evaluation 04/06/16   PT Start Time 0947   PT Stop Time 1042   PT Time Calculation (min) 55 min   Activity Tolerance Patient tolerated treatment well   Behavior During Therapy [facility] for tasks assessed/performed      Past Medical History:  Diagnosis Date  . Allergy    seasonal  . Chronic kidney disease    has one kidney  . Hyperlipidemia   . Hypertension   . Osteopenia   . Thyroid disease     Past Surgical History:  Procedure Laterality Date  . ABDOMINAL HYSTERECTOMY    . APPENDECTOMY    . EYE SURGERY     70 years old  . MASTECTOMY Right 1984  . THYROID SURGERY      There were no vitals filed for this visit.      Subjective Assessment - 04/06/16 0947    Subjective Reports discomfort thursday but was doing more activity than normal. Reports that she scaled back on exercises with pulley height and cut back to 3x a day.    Pertinent History H/o lymphedema of right UE.   Patient Stated Goals Get back to normal function.   Currently in Pain? Other (Comment)  No rating of any discomfort upon arrival today            [facility] PT Assessment - 04/06/16 0001      Assessment   Medical Diagnosis Right rotator cuff repair.   Onset Date/Surgical Date 02/06/16   Next MD Visit 04/12/2016     Restrictions   Weight Bearing Restrictions No                     [facility] Adult PT Treatment/Exercise - 04/06/16 0001      Shoulder Exercises: Supine   Protraction --   External Rotation AAROM;Right;20 reps;Other (comment)  3x10 reps   Flexion AAROM;Both;20  reps   Other Supine Exercises R Bicep curl 1# 3x10 reps     Shoulder Exercises: Standing   Other Standing Exercises Standing RUE table ABCs 2# ball x1 rep     Shoulder Exercises: Pulleys   Flexion Other (comment)  x7 min   Other Pulley Exercises standing UE ranger flex, CW, CCW x 20 each     Modalities   Modalities Electrical Stimulation;Moist Heat     Moist Heat Therapy   Number Minutes Moist Heat 15 Minutes   Moist Heat Location Shoulder     Electrical Stimulation   Electrical Stimulation Location Rt Shoulder   Electrical Stimulation Action IFC   Electrical Stimulation Parameters 1-10 hz x15 min   Electrical Stimulation Goals Pain     Manual Therapy   Manual Therapy Passive ROM;Soft tissue mobilization   Soft tissue mobilization STW to R Deltoids, Bicep, lateral Pectoralis to decrease tightness    Passive ROM PROM of R shoulder into flexion/ ER with gentle holds at end range                     PT Long Term Goals - 04/06/16 1009  PT LONG TERM GOAL #1   Title Independent with HEP.   Status Achieved     PT LONG TERM GOAL #2   Title Active shoulder flexion to 145 degrees so the patient can easily reach overhead.   Status On-going     PT LONG TERM GOAL #3   Title Active ER to 70 degrees+ to allow for easily donning/doffing of apparel.   Status On-going     PT LONG TERM GOAL #4   Title Increase ROM so patient is able to reach behind back to L4.   Status On-going     PT LONG TERM GOAL #5   Title Increase right shoulder strength to a solid 4+/5 to increase stability for performance of functional activities   Status On-going               Plan - 04/06/16 1009    Clinical Impression Statement Patient tolerated today's treatment fairly well although exercise repititions were reduced slightly secondary to discomfort. Patient experienced discomfort especially with seated pulleys today per patient report. Patient had no other reports of pain during  exercises. Patient educated to limit exercises to the point of discomfort but not past to avoid any increased discomfort. Tightness was noted in R deltoids and lateral pectoralis upon palpation with moderate release upon manual therapy. Normal modalities response noted following removal of the modalities. Patient educated to scale back on HEP t oavoid any increased discomfort as to what she experienced over Thanksgiving weekend if she saw fit.   Rehab Potential Good   PT Frequency 3x / week   PT Duration 4 weeks   PT Treatment/Interventions ADLs/Self Care Home Management;Cryotherapy;Electrical Stimulation;Therapeutic activities;Therapeutic exercise;Patient/family education;Passive range of motion;Manual lymph drainage   PT Next Visit Plan Cotninue per protocol in media with AAROM exercises and modalities PRN. MD note to be completed next treatment.   Consulted and Agree with Plan of Care Patient      Patient will benefit from skilled therapeutic intervention in order to improve the following deficits and impairments:  Pain, Decreased activity tolerance, Decreased range of motion  Visit Diagnosis: Acute pain of left shoulder  Stiffness of left shoulder, not elsewhere classified     Problem List Patient Active Problem List   Diagnosis Date Noted  . HLD (hyperlipidemia) 02/13/2013  . Need for prophylactic vaccination and inoculation against influenza 02/13/2013  . Hypothyroidism 08/04/2012  . Osteopenia 08/12/2010  . Breast CA ([place]) 08/12/2010  . Hyperlipidemia 08/12/2010  . HTN (hypertension) 08/12/2010    [name], PTA 04/06/2016, 10:53 AM  [facility] [address] [place], [place], [address] Phone: [phone]   Fax:  [phone]  Name: April Weber MRN: [id number] Date of Birth: 12-Nov-1945

## 2016-04-08 ENCOUNTER — Encounter: Payer: Self-pay | Admitting: Physical Therapy

## 2016-04-08 ENCOUNTER — Ambulatory Visit: Payer: Medicare Other | Admitting: Physical Therapy

## 2016-04-08 ENCOUNTER — Telehealth: Payer: Self-pay | Admitting: Family Medicine

## 2016-04-08 DIAGNOSIS — M25512 Pain in left shoulder: Secondary | ICD-10-CM | POA: Diagnosis not present

## 2016-04-08 DIAGNOSIS — M25612 Stiffness of left shoulder, not elsewhere classified: Secondary | ICD-10-CM

## 2016-04-08 NOTE — Therapy (Signed)
[facility] [place], [place], [address] Phone: [phone]   Fax:  [phone]  Physical Therapy Treatment  Patient Details  Name: April Weber MRN: [id number] Date of Birth: Jul 01, 1945 Referring Provider: Justice Britain MD  Encounter Date: 04/08/2016      PT End of Session - 04/08/16 1035    Visit Number 10   Number of Visits 12   Date for PT Re-Evaluation 04/06/16   PT Start Time 0945   PT Stop Time 1046   PT Time Calculation (min) 61 min   Activity Tolerance Patient tolerated treatment well   Behavior During Therapy [facility] for tasks assessed/performed      Past Medical History:  Diagnosis Date  . Allergy    seasonal  . Chronic kidney disease    has one kidney  . Hyperlipidemia   . Hypertension   . Osteopenia   . Thyroid disease     Past Surgical History:  Procedure Laterality Date  . ABDOMINAL HYSTERECTOMY    . APPENDECTOMY    . EYE SURGERY     70 years old  . MASTECTOMY Right 1984  . THYROID SURGERY      There were no vitals filed for this visit.      Subjective Assessment - 04/08/16 0950    Subjective Patient reproted some improvement overall yet ongoing soreness   Pertinent History H/o lymphedema of right UE.   Patient Stated Goals Get back to normal function.   Currently in Pain? Yes   Pain Score 3    Pain Location Shoulder   Pain Orientation Right   Pain Descriptors / Indicators Aching   Pain Type Surgical pain   Pain Onset More than a month ago   Pain Frequency Intermittent   Aggravating Factors  certain movements   Pain Relieving Factors at rest            [facility] PT Assessment - 04/08/16 0001      ROM / Strength   AROM / PROM / Strength AROM;PROM     AROM   AROM Assessment Site Shoulder   Right/Left Shoulder Right   Right Shoulder External Rotation 25 Degrees     PROM   PROM Assessment Site Shoulder   Right/Left Shoulder Right   Right Shoulder Flexion 117 Degrees    Right Shoulder External Rotation 30 Degrees                     [facility] Adult PT Treatment/Exercise - 04/08/16 0001      Shoulder Exercises: Supine   Other Supine Exercises cane supine for ER and flexion 2x10     Shoulder Exercises: Pulleys   Flexion --  65min   Other Pulley Exercises standing UE ranger flex, CW, CCW x 20 each     Moist Heat Therapy   Number Minutes Moist Heat 15 Minutes   Moist Heat Location Shoulder     Electrical Stimulation   Electrical Stimulation Location Rt Shoulder   Electrical Stimulation Action IFC   Electrical Stimulation Parameters 1-10hz  x44min   Electrical Stimulation Goals Pain     Manual Therapy   Manual Therapy Passive ROM   Passive ROM PROM of R shoulder into flexion/ ER with gentle holds at end range                     PT Long Term Goals - 04/08/16 0959      PT LONG TERM GOAL #1  Title Independent with HEP.   Period Weeks   Status Achieved     PT LONG TERM GOAL #2   Title Active shoulder flexion to 145 degrees so the patient can easily reach overhead.   Time 4   Period Weeks   Status On-going     PT LONG TERM GOAL #3   Title Active ER to 70 degrees+ to allow for easily donning/doffing of apparel.   Time 4   Period Weeks   Status On-going     PT LONG TERM GOAL #4   Title Increase ROM so patient is able to reach behind back to L4.   Time 4   Period Weeks   Status On-going     PT LONG TERM GOAL #5   Title Increase right shoulder strength to a solid 4+/5 to increase stability for performance of functional activities   Time 4   Period Weeks   Status On-going               Plan - April 11, 2016 1037    Clinical Impression Statement Patient progresing with ROM today and tolerated treatment well. patient reported doing HEP daily. Patient is able to use arm for only light ADL's per her MD instructed (washing face and doing hair with elbow bent and leaning forward) FOTO 59%limitation (Initial 96%)  Patient progressing toward goals yet is limited due to protocol /healing.   Rehab Potential Good   PT Frequency 3x / week   PT Duration 4 weeks   PT Treatment/Interventions ADLs/Self Care Home Management;Cryotherapy;Electrical Stimulation;Therapeutic activities;Therapeutic exercise;Patient/family education;Passive range of motion;Manual lymph drainage   PT Next Visit Plan Cotninue per protocol in media with AAROM exercises and modalities PRN. ( no UBE per MD)MD.Supple 04/12/16   Consulted and Agree with Plan of Care Patient      Patient will benefit from skilled therapeutic intervention in order to improve the following deficits and impairments:  Pain, Decreased activity tolerance, Decreased range of motion  Visit Diagnosis: Stiffness of left shoulder, not elsewhere classified  Acute pain of left shoulder       G-Codes - Apr 11, 2016 1151    Functional Assessment Tool Used 10th visit....59% limitation.   Functional Limitation Carrying, moving and handling objects   Carrying, Moving and Handling Objects Current Status [id number]) At least 40 percent but less than 60 percent impaired, limited or restricted   Carrying, Moving and Handling Objects Goal Status [id number]) At least 1 percent but less than 20 percent impaired, limited or restricted      Problem List Patient Active Problem List   Diagnosis Date Noted  . HLD (hyperlipidemia) 02/13/2013  . Need for prophylactic vaccination and inoculation against influenza 02/13/2013  . Hypothyroidism 08/04/2012  . Osteopenia 08/12/2010  . Breast CA ([place]) 08/12/2010  . Hyperlipidemia 08/12/2010  . HTN (hypertension) 08/12/2010    APPLEGATE, [place], PTA April 11, 2016, 11:51 AM   Mali Applegate MPT  [facility] [place] [address] [place], [place], [address] Phone: [phone]   Fax:  [phone]  Name: April Weber MRN: [id number] Date of Birth: 03-Dec-1945

## 2016-04-08 NOTE — Telephone Encounter (Signed)
noted 

## 2016-04-12 DIAGNOSIS — Z4789 Encounter for other orthopedic aftercare: Secondary | ICD-10-CM | POA: Diagnosis not present

## 2016-04-12 DIAGNOSIS — S46811D Strain of other muscles, fascia and tendons at shoulder and upper arm level, right arm, subsequent encounter: Secondary | ICD-10-CM | POA: Diagnosis not present

## 2016-04-13 ENCOUNTER — Ambulatory Visit: Payer: Medicare Other | Attending: Orthopedic Surgery | Admitting: Physical Therapy

## 2016-04-13 DIAGNOSIS — M25612 Stiffness of left shoulder, not elsewhere classified: Secondary | ICD-10-CM | POA: Insufficient documentation

## 2016-04-13 DIAGNOSIS — M25512 Pain in left shoulder: Secondary | ICD-10-CM | POA: Diagnosis not present

## 2016-04-13 NOTE — Therapy (Signed)
[facility] [place], [place], [address] Phone: [phone]   Fax:  [phone]  Physical Therapy Treatment  Patient Details  Name: April Weber MRN: [id number] Date of Birth: [birth year] Referring Provider: Justice Britain MD  Encounter Date: 04/13/2016      PT End of Session - 04/13/16 0954    Visit Number 11   Number of Visits 12   Date for PT Re-Evaluation 04/06/16   PT Start Time 0948   PT Stop Time 1049   PT Time Calculation (min) 61 min   Activity Tolerance Patient tolerated treatment well   Behavior During Therapy [facility] for tasks assessed/performed      Past Medical History:  Diagnosis Date  . Allergy    seasonal  . Chronic kidney disease    has one kidney  . Hyperlipidemia   . Hypertension   . Osteopenia   . Thyroid disease     Past Surgical History:  Procedure Laterality Date  . ABDOMINAL HYSTERECTOMY    . APPENDECTOMY    . EYE SURGERY     70 years old  . MASTECTOMY Right 1984  . THYROID SURGERY      There were no vitals filed for this visit.      Subjective Assessment - 04/13/16 0947    Subjective Reports that she felt a small movement in anterior shoulder doing exercises on sunday and pain has decreased. Reports MD told her to move more on her own. Pain in R shoulder no longer constant but still present.   Pertinent History H/o lymphedema of right UE.   Patient Stated Goals Get back to normal function.   Currently in Pain? Yes   Pain Score 3    Pain Location Shoulder   Pain Orientation Right   Pain Descriptors / Indicators Sore   Pain Type Surgical pain   Pain Onset More than a month ago   Pain Frequency Intermittent            [facility] PT Assessment - 04/13/16 0001      Assessment   Medical Diagnosis Right rotator cuff repair.   Onset Date/Surgical Date 02/06/16   Next MD Visit 05/12/2016     Restrictions   Weight Bearing Restrictions No                     [facility]  Adult PT Treatment/Exercise - 04/13/16 0001      Shoulder Exercises: Supine   Protraction AROM;Right;20 reps   Flexion AROM;Both;20 reps     Shoulder Exercises: Sidelying   External Rotation AROM;Right;20 reps   Flexion AROM;Right;20 reps     Shoulder Exercises: Standing   Other Standing Exercises Standing RUE table ABCs 2# ball x1 rep   Other Standing Exercises Standing RUE wall ladder x15 reps     Shoulder Exercises: Pulleys   Flexion Other (comment)  x5 min   Other Pulley Exercises standing UE ranger flex, CW, CCW x 20 each     Modalities   Modalities Electrical Stimulation;Moist Heat     Moist Heat Therapy   Number Minutes Moist Heat 15 Minutes   Moist Heat Location Shoulder     Electrical Stimulation   Electrical Stimulation Location R Shoulder/ UT   Electrical Stimulation Action IFC   Electrical Stimulation Parameters 1-10 hz x15 min   Electrical Stimulation Goals Pain     Manual Therapy   Manual Therapy Soft tissue mobilization   Soft tissue mobilization STW to R  UT to decrease tightness                PT Education - 04/13/16 1038    Education provided Yes   Education Details HEP- AROM supine and SL flexion with SL ER   Person(s) Educated Patient   Methods Explanation;Demonstration;Tactile cues;Verbal cues;Handout   Comprehension Verbalized understanding;Returned demonstration;Tactile cues required;Verbal cues required             PT Long Term Goals - 04/08/16 0959      PT LONG TERM GOAL #1   Title Independent with HEP.   Period Weeks   Status Achieved     PT LONG TERM GOAL #2   Title Active shoulder flexion to 145 degrees so the patient can easily reach overhead.   Time 4   Period Weeks   Status On-going     PT LONG TERM GOAL #3   Title Active ER to 70 degrees+ to allow for easily donning/doffing of apparel.   Time 4   Period Weeks   Status On-going     PT LONG TERM GOAL #4   Title Increase ROM so patient is able to reach behind  back to L4.   Time 4   Period Weeks   Status On-going     PT LONG TERM GOAL #5   Title Increase right shoulder strength to a solid 4+/5 to increase stability for performance of functional activities   Time 4   Period Weeks   Status On-going               Plan - 04/13/16 1030    Clinical Impression Statement Patient arrived to treatment with continued progress as she was advanced to more AROM exercises today. Patient taken through AROM and AAROM exercises with greatest complaint being R shoulder fatigue especially following wall ladder and sidelying R shoulder AROM exercises. Patient required moderate to max amount of reassurance of correct technique of exercises today. Tightness palpated throughout R UT and TPs noted as well during STW. Normal modalities response noted following removal of the modalities. HEP education completed following modalities with patient writing notes for pulleys and wall ladder type exercise to be completed at a door at her home as well as for parameters of HEP.   Rehab Potential Good   PT Frequency 3x / week   PT Duration 4 weeks   PT Treatment/Interventions ADLs/Self Care Home Management;Cryotherapy;Electrical Stimulation;Therapeutic activities;Therapeutic exercise;Patient/family education;Passive range of motion;Manual lymph drainage   PT Next Visit Plan Continue to progress into AROM exercises with modalities and manual therapy per MPT POC.   PT Home Exercise Plan HEP- pulleys, wall ladder, AROM supine flexion, SL flexion and SL ER   Consulted and Agree with Plan of Care Patient      Patient will benefit from skilled therapeutic intervention in order to improve the following deficits and impairments:  Pain, Decreased activity tolerance, Decreased range of motion  Visit Diagnosis: Stiffness of left shoulder, not elsewhere classified  Acute pain of left shoulder     Problem List Patient Active Problem List   Diagnosis Date Noted  . HLD  (hyperlipidemia) 02/13/2013  . Need for prophylactic vaccination and inoculation against influenza 02/13/2013  . Hypothyroidism 08/04/2012  . Osteopenia 08/12/2010  . Breast CA ([place]) 08/12/2010  . Hyperlipidemia 08/12/2010  . HTN (hypertension) 08/12/2010   Ahmed Prima, PTA 04/13/16 11:40 AM  [facility] [address] [place], [place], [address] Phone: [phone]   Fax:  [phone]  Name: April Weber MRN: [id number] Date of Birth: June 01, 1945

## 2016-04-13 NOTE — Patient Instructions (Addendum)
Progressive Resisted: External Rotation (Side-Lying)    Lay on your left side with a towel under right arm, raise right forearm toward ceiling. Keep elbow bent and at side. Repeat __10__ times per set. Do __2__ sets per session. Do __2-3__ sessions per day.  http://orth.exer.us/878   Copyright  VHI. All rights reserved.  ROM: Flexion     Lay on your back and bring arms straight out in front and raise as high as possible without pain. Keep palms facing up.  Lay on your left side and start with your right hand by your hip. Then bring your right arm up in front of you towards the wall in front of you.  Repeat __10__ times per set. Do __2__ sets per session. Do _2-3___ sessions per day.  http://orth.exer.us/908   Copyright  VHI. All rights reserved.

## 2016-04-15 ENCOUNTER — Ambulatory Visit: Payer: Medicare Other | Admitting: *Deleted

## 2016-04-15 DIAGNOSIS — M25512 Pain in left shoulder: Secondary | ICD-10-CM | POA: Diagnosis not present

## 2016-04-15 DIAGNOSIS — M25612 Stiffness of left shoulder, not elsewhere classified: Secondary | ICD-10-CM

## 2016-04-15 NOTE — Therapy (Signed)
[facility] [place], [place], [address] Phone: [phone]   Fax:  [phone]  Physical Therapy Treatment  Patient Details  Name: April Weber MRN: [id number] Date of Birth: 20-Aug-1945 Referring Provider: Justice Britain MD  Encounter Date: 04/15/2016      PT End of Session - 04/15/16 0956    Visit Number 12   Number of Visits 24   Date for PT Re-Evaluation 05/06/16  New MD order   Authorization Type next MD 05-12-16   PT Start Time 0945   PT Stop Time 1035   PT Time Calculation (min) 50 min      Past Medical History:  Diagnosis Date  . Allergy    seasonal  . Chronic kidney disease    has one kidney  . Hyperlipidemia   . Hypertension   . Osteopenia   . Thyroid disease     Past Surgical History:  Procedure Laterality Date  . ABDOMINAL HYSTERECTOMY    . APPENDECTOMY    . EYE SURGERY     70 years old  . MASTECTOMY Right 1984  . THYROID SURGERY      There were no vitals filed for this visit.      Subjective Assessment - 04/15/16 0954    Subjective Reports that she felt a small movement in anterior shoulder doing exercises on sunday and pain has decreased. Reports MD told her to move more on her own. Pain in R shoulder no longer constant but still present.  New Order to continue with protocol   Patient Stated Goals Get back to normal function.   Currently in Pain? Yes   Pain Score 3    Pain Location Shoulder   Pain Orientation Right   Pain Descriptors / Indicators Sore   Pain Frequency Intermittent                         [facility] Adult PT Treatment/Exercise - 04/15/16 0001      Elbow Exercises   Elbow Flexion AROM;20 reps;10 reps  1#     Shoulder Exercises: Supine   Protraction AROM;Right;20 reps   Flexion AROM;Both;20 reps     Shoulder Exercises: Prone   Extension AROM;Right;10 reps;20 reps  ROWS 3x10 AROM     Shoulder Exercises: Sidelying   External Rotation AROM;Right;20  reps;10 reps   Flexion AROM;Right;20 reps;10 reps     Shoulder Exercises: Pulleys   Flexion Other (comment)  x5 min   Other Pulley Exercises standing UE ranger flex, CW, CCW x 20 each     Modalities   Modalities Electrical Stimulation;Moist Heat     Moist Heat Therapy   Number Minutes Moist Heat 15 Minutes   Moist Heat Location Shoulder     Electrical Stimulation   Electrical Stimulation Location R Shoulder/ UT  IFC 1-10hz  x 15 mins   Electrical Stimulation Goals Pain     Manual Therapy   Manual Therapy Soft tissue mobilization   Soft tissue mobilization STW to R UT to decrease tightness   Passive ROM PROM of R shoulder into flexion/ ABD, and  ER with  holds at end range.  Rhythmic stabs for ER/IR                     PT Long Term Goals - 04/08/16 0959      PT LONG TERM GOAL #1   Title Independent with HEP.   Period Weeks   Status  Achieved     PT LONG TERM GOAL #2   Title Active shoulder flexion to 145 degrees so the patient can easily reach overhead.   Time 4   Period Weeks   Status On-going     PT LONG TERM GOAL #3   Title Active ER to 70 degrees+ to allow for easily donning/doffing of apparel.   Time 4   Period Weeks   Status On-going     PT LONG TERM GOAL #4   Title Increase ROM so patient is able to reach behind back to L4.   Time 4   Period Weeks   Status On-going     PT LONG TERM GOAL #5   Title Increase right shoulder strength to a solid 4+/5 to increase stability for performance of functional activities   Time 4   Period Weeks   Status On-going               Plan - 04/15/16 1049    Clinical Impression Statement Pt did fairly well with AROM exs today for RT shldr. Pt had MD note to continue with PT as per protocol. She was able to reach 135 degrees of Passive flexion today and 50 degrees of ER. Pt still had notable tightness an TP in RT UT that did release with STW. normal modalkity response today.   Rehab Potential Good   PT  Frequency 3x / week   PT Duration 4 weeks   PT Treatment/Interventions ADLs/Self Care Home Management;Cryotherapy;Electrical Stimulation;Therapeutic activities;Therapeutic exercise;Patient/family education;Passive range of motion;Manual lymph drainage   PT Next Visit Plan Continue to progress into AROM exercises with modalities and manual therapy per MPT POC.    Pt is 10 weeks 04-16-16   PT Home Exercise Plan HEP- pulleys, wall ladder, AROM supine flexion, SL flexion and SL ER   Consulted and Agree with Plan of Care Patient      Patient will benefit from skilled therapeutic intervention in order to improve the following deficits and impairments:  Pain, Decreased activity tolerance, Decreased range of motion  Visit Diagnosis: Stiffness of left shoulder, not elsewhere classified  Acute pain of left shoulder     Problem List Patient Active Problem List   Diagnosis Date Noted  . HLD (hyperlipidemia) 02/13/2013  . Need for prophylactic vaccination and inoculation against influenza 02/13/2013  . Hypothyroidism 08/04/2012  . Osteopenia 08/12/2010  . Breast CA ([place]) 08/12/2010  . Hyperlipidemia 08/12/2010  . HTN (hypertension) 08/12/2010    RAMSEUR,CHRIS, PTA 04/15/2016, 10:59 AM  [facility] [place], [place], [address] Phone: [phone]   Fax:  [phone]  Name: April Weber MRN: [id number] Date of Birth: 06-01-1945

## 2016-04-20 ENCOUNTER — Ambulatory Visit: Payer: Medicare Other | Admitting: Physical Therapy

## 2016-04-20 ENCOUNTER — Encounter: Payer: Self-pay | Admitting: Physical Therapy

## 2016-04-20 DIAGNOSIS — M25612 Stiffness of left shoulder, not elsewhere classified: Secondary | ICD-10-CM

## 2016-04-20 DIAGNOSIS — M25512 Pain in left shoulder: Secondary | ICD-10-CM

## 2016-04-20 NOTE — Therapy (Signed)
[facility] [place], [place], [address] Phone: [phone]   Fax:  [phone]  Physical Therapy Treatment  Patient Details  Name: April Weber MRN: [id number] Date of Birth: 01-Dec-1945 Referring Provider: Justice Britain MD  Encounter Date: 04/20/2016      PT End of Session - 04/20/16 0943    Visit Number 13   Number of Visits 24   Date for PT Re-Evaluation 07/05/16   PT Start Time 0947   PT Stop Time 1044   PT Time Calculation (min) 57 min   Activity Tolerance Patient tolerated treatment well   Behavior During Therapy [facility] for tasks assessed/performed      Past Medical History:  Diagnosis Date  . Allergy    seasonal  . Chronic kidney disease    has one kidney  . Hyperlipidemia   . Hypertension   . Osteopenia   . Thyroid disease     Past Surgical History:  Procedure Laterality Date  . ABDOMINAL HYSTERECTOMY    . APPENDECTOMY    . EYE SURGERY     70 years old  . MASTECTOMY Right 1984  . THYROID SURGERY      There were no vitals filed for this visit.      Subjective Assessment - 04/20/16 0943    Subjective Reports some ache in shoulder and reports pain in her neck. Reports trying muscle relaxer and did not help.   Pertinent History H/o lymphedema of right UE.   Patient Stated Goals Get back to normal function.   Currently in Pain? Yes   Pain Score 3    Pain Location Shoulder   Pain Orientation Right   Pain Descriptors / Indicators Aching   Pain Type Surgical pain   Pain Onset More than a month ago            [facility] PT Assessment - 04/20/16 0001      Assessment   Medical Diagnosis Right rotator cuff repair.   Onset Date/Surgical Date 02/06/16   Next MD Visit 05/12/2016     Restrictions   Weight Bearing Restrictions No                     [facility] Adult PT Treatment/Exercise - 04/20/16 0001      Shoulder Exercises: Supine   Protraction AROM;Right;20 reps   Flexion  AROM;Both;20 reps   Other Supine Exercises AROM R shoulder circles, triangles x20 reps each   Other Supine Exercises R shoulder ABCs x1 rep     Shoulder Exercises: Sidelying   External Rotation AROM;Right   External Rotation Limitations 3x10 reps   Flexion AROM;Right;Other (comment)  3x10 reps     Shoulder Exercises: Standing   Other Standing Exercises Bent RUE AROM extension and row x20 reps each   Other Standing Exercises Standing RUE wall ladder x15 reps     Shoulder Exercises: Pulleys   Flexion Other (comment)  x5 min   Other Pulley Exercises standing UE ranger flex, CW, CCW x 20 each     Modalities   Modalities Electrical Stimulation;Moist Heat     Moist Heat Therapy   Number Minutes Moist Heat 15 Minutes   Moist Heat Location Shoulder     Electrical Stimulation   Electrical Stimulation Location R shoulder/UT   Electrical Stimulation Action IFC   Electrical Stimulation Parameters 1-10 hz x15 min   Electrical Stimulation Goals Pain     Manual Therapy   Manual Therapy Soft tissue mobilization  Soft tissue mobilization STW to R UT, deltoids to decrease tightness and pain                     PT Long Term Goals - 04/08/16 0959      PT LONG TERM GOAL #1   Title Independent with HEP.   Period Weeks   Status Achieved     PT LONG TERM GOAL #2   Title Active shoulder flexion to 145 degrees so the patient can easily reach overhead.   Time 4   Period Weeks   Status On-going     PT LONG TERM GOAL #3   Title Active ER to 70 degrees+ to allow for easily donning/doffing of apparel.   Time 4   Period Weeks   Status On-going     PT LONG TERM GOAL #4   Title Increase ROM so patient is able to reach behind back to L4.   Time 4   Period Weeks   Status On-going     PT LONG TERM GOAL #5   Title Increase right shoulder strength to a solid 4+/5 to increase stability for performance of functional activities   Time 4   Period Weeks   Status On-going                Plan - 04/20/16 1032    Clinical Impression Statement Patient tolerated today's treatment fairly well as she was able to tolerate shoulder exercises with reports of R shoulder ache. Patient able to tolerate other stabilization exercises of circles, triangles and ABCs with only complaint of fatigue. Tightness and TPs continue to be noted in R UT region and tightness extends down into R deltoids especially tricep and posterior deltoid region. Normal modalities response noted following removal of the modalities.   Rehab Potential Good   PT Frequency 3x / week   PT Duration 4 weeks   PT Treatment/Interventions ADLs/Self Care Home Management;Cryotherapy;Electrical Stimulation;Therapeutic activities;Therapeutic exercise;Patient/family education;Passive range of motion;Manual lymph drainage   PT Next Visit Plan Continue to progress into AROM exercises with modalities and manual therapy per MPT POC.    Pt is 10 weeks 04-16-16   PT Home Exercise Plan HEP- pulleys, wall ladder, AROM supine flexion, SL flexion and SL ER   Consulted and Agree with Plan of Care Patient      Patient will benefit from skilled therapeutic intervention in order to improve the following deficits and impairments:  Pain, Decreased activity tolerance, Decreased range of motion  Visit Diagnosis: Stiffness of left shoulder, not elsewhere classified  Acute pain of left shoulder     Problem List Patient Active Problem List   Diagnosis Date Noted  . HLD (hyperlipidemia) 02/13/2013  . Need for prophylactic vaccination and inoculation against influenza 02/13/2013  . Hypothyroidism 08/04/2012  . Osteopenia 08/12/2010  . Breast CA ([place]) 08/12/2010  . Hyperlipidemia 08/12/2010  . HTN (hypertension) 08/12/2010    [name], PTA 04/20/2016, 10:50 AM  [facility] [address] [place], [place], [address] Phone: [phone]   Fax:  [phone]  Name:  April Weber MRN: [id number] Date of Birth: 12/03/1945

## 2016-04-22 ENCOUNTER — Encounter: Payer: Self-pay | Admitting: Physical Therapy

## 2016-04-22 ENCOUNTER — Ambulatory Visit: Payer: Medicare Other | Admitting: Physical Therapy

## 2016-04-22 DIAGNOSIS — M25512 Pain in left shoulder: Secondary | ICD-10-CM

## 2016-04-22 DIAGNOSIS — M25612 Stiffness of left shoulder, not elsewhere classified: Secondary | ICD-10-CM | POA: Diagnosis not present

## 2016-04-22 NOTE — Therapy (Signed)
[facility] [place], [place], [address] Phone: [phone]   Fax:  [phone]  Physical Therapy Treatment  Patient Details  Name: April Weber MRN: [id number] Date of Birth: 1945/08/21 Referring Provider: Justice Britain MD  Encounter Date: 04/22/2016      PT End of Session - 04/22/16 0948    Visit Number 14   Number of Visits 24   Date for PT Re-Evaluation 07/05/16   PT Start Time 0945   PT Stop Time 1040   PT Time Calculation (min) 55 min   Activity Tolerance Patient tolerated treatment well   Behavior During Therapy [facility] for tasks assessed/performed      Past Medical History:  Diagnosis Date  . Allergy    seasonal  . Chronic kidney disease    has one kidney  . Hyperlipidemia   . Hypertension   . Osteopenia   . Thyroid disease     Past Surgical History:  Procedure Laterality Date  . ABDOMINAL HYSTERECTOMY    . APPENDECTOMY    . EYE SURGERY     70 years old  . MASTECTOMY Right 1984  . THYROID SURGERY      There were no vitals filed for this visit.      Subjective Assessment - 04/22/16 0946    Subjective Reports some aching at times. Reports some with exercises and intermittant shock pain at times at rest.    Pertinent History H/o lymphedema of right UE.   Patient Stated Goals Get back to normal function.   Currently in Pain? Yes   Pain Score 2    Pain Location Shoulder   Pain Orientation Right   Pain Descriptors / Indicators Aching   Pain Type Surgical pain   Pain Onset More than a month ago   Pain Frequency Intermittent            [facility] PT Assessment - 04/22/16 0001      Assessment   Medical Diagnosis Right rotator cuff repair.   Onset Date/Surgical Date 02/06/16   Next MD Visit 05/12/2016     Restrictions   Weight Bearing Restrictions No                     [facility] Adult PT Treatment/Exercise - 04/22/16 0001      Elbow Exercises   Elbow Flexion  Strengthening;Right;Other reps (comment);Seated;Bar weights/barbell  1# 3x10 reps     Shoulder Exercises: Supine   Protraction AROM;Right;20 reps   Flexion AROM;Both;20 reps   Other Supine Exercises AROM R shoulder circles, triangles x20 reps each   Other Supine Exercises R shoulder ABCs x1 rep     Shoulder Exercises: Sidelying   External Rotation AROM;Right   External Rotation Limitations 3x10 reps   Internal Rotation AROM;Right;20 reps   Flexion AROM;Right;Other (comment)  3x10 reps     Shoulder Exercises: Standing   Other Standing Exercises Bent RUE AROM extension and row x20 reps each   Other Standing Exercises Standing RUE wall ladder x10 reps     Shoulder Exercises: Pulleys   Flexion 3 minutes   Other Pulley Exercises standing UE ranger flex, CW, CCW x 20 each     Modalities   Modalities Electrical Stimulation;Moist Heat     Moist Heat Therapy   Number Minutes Moist Heat 15 Minutes   Moist Heat Location Shoulder     Electrical Stimulation   Electrical Stimulation Location R shoulder/UT   Electrical Stimulation Action IFC   Electrical  Stimulation Parameters 1-10 hz x15 min   Electrical Stimulation Goals Pain     Manual Therapy   Manual Therapy Passive ROM   Passive ROM PROM of R shoulder into flex/ER with gentle holds at end range                     PT Long Term Goals - 04/08/16 0959      PT LONG TERM GOAL #1   Title Independent with HEP.   Period Weeks   Status Achieved     PT LONG TERM GOAL #2   Title Active shoulder flexion to 145 degrees so the patient can easily reach overhead.   Time 4   Period Weeks   Status On-going     PT LONG TERM GOAL #3   Title Active ER to 70 degrees+ to allow for easily donning/doffing of apparel.   Time 4   Period Weeks   Status On-going     PT LONG TERM GOAL #4   Title Increase ROM so patient is able to reach behind back to L4.   Time 4   Period Weeks   Status On-going     PT LONG TERM GOAL #5    Title Increase right shoulder strength to a solid 4+/5 to increase stability for performance of functional activities   Time 4   Period Weeks   Status On-going               Plan - 04/22/16 1039    Clinical Impression Statement Patient tolerated today's treatment well and able to continue with AROM exercises with no complaints. Patient demonstrates good form of all exercises directed to her today. No complaints of neck tightness or shoulder tightness today. Patient requested PROM to assist with stretching RUE muscles. Firm end feels noted with PROM of R shoulder with more limitation with ER at this time. Normal modalities response noted following removal of the modalites.   Rehab Potential Good   PT Frequency 3x / week   PT Duration 4 weeks   PT Treatment/Interventions ADLs/Self Care Home Management;Cryotherapy;Electrical Stimulation;Therapeutic activities;Therapeutic exercise;Patient/family education;Passive range of motion;Manual lymph drainage   PT Next Visit Plan Continue to progress into AROM exercises with modalities and manual therapy per MPT POC.    Pt is 10 weeks 04-16-16   PT Home Exercise Plan HEP- pulleys, wall ladder, AROM supine flexion, SL flexion and SL ER   Consulted and Agree with Plan of Care Patient      Patient will benefit from skilled therapeutic intervention in order to improve the following deficits and impairments:  Pain, Decreased activity tolerance, Decreased range of motion  Visit Diagnosis: Stiffness of left shoulder, not elsewhere classified  Acute pain of left shoulder     Problem List Patient Active Problem List   Diagnosis Date Noted  . HLD (hyperlipidemia) 02/13/2013  . Need for prophylactic vaccination and inoculation against influenza 02/13/2013  . Hypothyroidism 08/04/2012  . Osteopenia 08/12/2010  . Breast CA ([place]) 08/12/2010  . Hyperlipidemia 08/12/2010  . HTN (hypertension) 08/12/2010    April Weber, PTA 04/22/2016, 11:05  AM  [facility] [address] [place], [place], [address] Phone: [phone]   Fax:  [phone]  Name: April Weber MRN: [id number] Date of Birth: 04/14/1946

## 2016-04-27 ENCOUNTER — Ambulatory Visit: Payer: Medicare Other | Admitting: Physical Therapy

## 2016-04-27 ENCOUNTER — Encounter: Payer: Self-pay | Admitting: Physical Therapy

## 2016-04-27 DIAGNOSIS — M25512 Pain in left shoulder: Secondary | ICD-10-CM

## 2016-04-27 DIAGNOSIS — M25612 Stiffness of left shoulder, not elsewhere classified: Secondary | ICD-10-CM | POA: Diagnosis not present

## 2016-04-27 NOTE — Therapy (Signed)
[facility] [place], [place], [address] Phone: [phone]   Fax:  [phone]  Physical Therapy Treatment  Patient Details  Name: April Weber MRN: [id number] Date of Birth: Jul 01, 1945 Referring Provider: Justice Britain MD  Encounter Date: 04/27/2016      PT End of Session - 04/27/16 0945    Visit Number 15   Number of Visits 24   Date for PT Re-Evaluation 07/05/16   PT Start Time 0944   PT Stop Time 1045   PT Time Calculation (min) 61 min   Activity Tolerance Patient tolerated treatment well   Behavior During Therapy [facility] for tasks assessed/performed      Past Medical History:  Diagnosis Date  . Allergy    seasonal  . Chronic kidney disease    has one kidney  . Hyperlipidemia   . Hypertension   . Osteopenia   . Thyroid disease     Past Surgical History:  Procedure Laterality Date  . ABDOMINAL HYSTERECTOMY    . APPENDECTOMY    . EYE SURGERY     70 years old  . MASTECTOMY Right 1984  . THYROID SURGERY      There were no vitals filed for this visit.      Subjective Assessment - 04/27/16 0943    Subjective Reports some ache over the weekend but reports taking celebrex over the weekend.   Pertinent History H/o lymphedema of right UE.   Patient Stated Goals Get back to normal function.   Currently in Pain? Yes   Pain Score 2    Pain Location Shoulder   Pain Orientation Right   Pain Descriptors / Indicators Other (Comment);Sore  "stiff"   Pain Type Surgical pain   Pain Onset More than a month ago            [facility] PT Assessment - 04/27/16 0001      Assessment   Medical Diagnosis Right rotator cuff repair.   Onset Date/Surgical Date 02/06/16   Next MD Visit 05/12/2016     Restrictions   Weight Bearing Restrictions No                     [facility] Adult PT Treatment/Exercise - 04/27/16 0001      Shoulder Exercises: Supine   Protraction AROM;Right;20 reps   Protraction  Limitations Reclined for lawnchair progression   Flexion AROM;Both;20 reps   Flexion Limitations Reclined for lawnchair progression   Other Supine Exercises R shoulder ABCs x1 rep in recline     Shoulder Exercises: Sidelying   External Rotation AROM;Right;20 reps   Internal Rotation AROM;Right;20 reps   Internal Rotation Limitations Feels stretch in R posterior humerus region   Flexion AROM;Right;20 reps     Shoulder Exercises: Standing   Other Standing Exercises Bent RUE AROM extension and row x20 reps each   Other Standing Exercises Standing RUE wall ladder x15 reps     Shoulder Exercises: Pulleys   Flexion Other (comment)  x6 min   Other Pulley Exercises standing UE ranger flex, CW, CCW x 20 each     Modalities   Modalities Electrical Stimulation;Moist Heat     Moist Heat Therapy   Number Minutes Moist Heat 15 Minutes   Moist Heat Location Shoulder     Electrical Stimulation   Electrical Stimulation Location R shoulder/UT   Electrical Stimulation Action IFC   Electrical Stimulation Parameters 1-10 hz x15 min   Electrical Stimulation Goals Pain  Manual Therapy   Manual Therapy Passive ROM   Passive ROM PROM of R shoulder into flex/ER/IR with gentle holds at end range                     PT Long Term Goals - 04/08/16 0959      PT LONG TERM GOAL #1   Title Independent with HEP.   Period Weeks   Status Achieved     PT LONG TERM GOAL #2   Title Active shoulder flexion to 145 degrees so the patient can easily reach overhead.   Time 4   Period Weeks   Status On-going     PT LONG TERM GOAL #3   Title Active ER to 70 degrees+ to allow for easily donning/doffing of apparel.   Time 4   Period Weeks   Status On-going     PT LONG TERM GOAL #4   Title Increase ROM so patient is able to reach behind back to L4.   Time 4   Period Weeks   Status On-going     PT LONG TERM GOAL #5   Title Increase right shoulder strength to a solid 4+/5 to increase  stability for performance of functional activities   Time 4   Period Weeks   Status On-going               Plan - 04/27/16 1033    Clinical Impression Statement Patient arrived to treatment with low level R shoulder stiffness and soreness today. Patient able to complete all exercises as directed with no reports of any negetive discomfort only of stretching with SL IR. Patient progressed to more reclined position for lawnchair progression. PROM completed today with longer hold at end range to promote increased ROM and stretch. Smooth arc of motion noted with PROM of R shoulder with limitation with ER/IR. Normal modalities response noted following removal of the modalities. Patient encouraged to continue HEP as previously directed.   Rehab Potential Good   PT Frequency 3x / week   PT Duration 4 weeks   PT Treatment/Interventions ADLs/Self Care Home Management;Cryotherapy;Electrical Stimulation;Therapeutic activities;Therapeutic exercise;Patient/family education;Passive range of motion;Manual lymph drainage   PT Next Visit Plan Continue to progress into AROM exercises with modalities and manual therapy per MPT POC.    Pt is 10 weeks 04-16-16   PT Home Exercise Plan HEP- pulleys, wall ladder, AROM supine flexion, SL flexion and SL ER   Consulted and Agree with Plan of Care Patient      Patient will benefit from skilled therapeutic intervention in order to improve the following deficits and impairments:  Pain, Decreased activity tolerance, Decreased range of motion  Visit Diagnosis: Stiffness of left shoulder, not elsewhere classified  Acute pain of left shoulder     Problem List Patient Active Problem List   Diagnosis Date Noted  . HLD (hyperlipidemia) 02/13/2013  . Need for prophylactic vaccination and inoculation against influenza 02/13/2013  . Hypothyroidism 08/04/2012  . Osteopenia 08/12/2010  . Breast CA ([place]) 08/12/2010  . Hyperlipidemia 08/12/2010  . HTN (hypertension)  08/12/2010    [name], PTA 04/27/2016, 10:48 AM  [facility] [address] [place], [place], [address] Phone: [phone]   Fax:  [phone]  Name: April Weber MRN: [id number] Date of Birth: [birth year]

## 2016-04-29 ENCOUNTER — Ambulatory Visit: Payer: Medicare Other | Admitting: Physical Therapy

## 2016-04-29 ENCOUNTER — Encounter: Payer: Self-pay | Admitting: Physical Therapy

## 2016-04-29 DIAGNOSIS — M25612 Stiffness of left shoulder, not elsewhere classified: Secondary | ICD-10-CM | POA: Diagnosis not present

## 2016-04-29 DIAGNOSIS — M25512 Pain in left shoulder: Secondary | ICD-10-CM | POA: Diagnosis not present

## 2016-04-29 NOTE — Therapy (Signed)
[facility] [place], [place], [address] Phone: [phone]   Fax:  [phone]  Physical Therapy Treatment  Patient Details  Name: April Weber MRN: [id number] Date of Birth: 1946/01/01 Referring Provider: Justice Britain MD  Encounter Date: 04/29/2016      PT End of Session - 04/29/16 1028    Visit Number 16   Number of Visits 24   Date for PT Re-Evaluation 07/05/16   Authorization Type surgery 02/06/16 current 12 weeks 04/30/16   PT Start Time 0945   PT Stop Time 1044   PT Time Calculation (min) 59 min   Activity Tolerance Patient tolerated treatment well   Behavior During Therapy [facility] for tasks assessed/performed      Past Medical History:  Diagnosis Date  . Allergy    seasonal  . Chronic kidney disease    has one kidney  . Hyperlipidemia   . Hypertension   . Osteopenia   . Thyroid disease     Past Surgical History:  Procedure Laterality Date  . ABDOMINAL HYSTERECTOMY    . APPENDECTOMY    . EYE SURGERY     71 years old  . MASTECTOMY Right 1984  . THYROID SURGERY      There were no vitals filed for this visit.      Subjective Assessment - 04/29/16 0956    Subjective patient reports progress yet ongoing soreness   Pertinent History H/o lymphedema of right UE.   Patient Stated Goals Get back to normal function.   Currently in Pain? Yes   Pain Score 2    Pain Location Shoulder   Pain Orientation Right   Pain Descriptors / Indicators Sore   Pain Type Surgical pain   Pain Onset More than a month ago   Pain Frequency Intermittent   Aggravating Factors  certain movements   Pain Relieving Factors at rest            [facility] PT Assessment - 04/29/16 0001      ROM / Strength   AROM / PROM / Strength AROM;PROM     AROM   AROM Assessment Site Shoulder   Right/Left Shoulder Right   Right Shoulder Flexion 100 Degrees   Right Shoulder External Rotation 35 Degrees     PROM   PROM Assessment  Site Shoulder   Right/Left Shoulder Right   Right Shoulder Flexion 126 Degrees   Right Shoulder External Rotation 53 Degrees                     [facility] Adult PT Treatment/Exercise - 04/29/16 0001      Shoulder Exercises: Supine   Protraction AROM;Right;20 reps   Protraction Limitations Reclined for lawnchair progression   Flexion AROM;Both;20 reps   Flexion Limitations Reclined for lawnchair progression   Other Supine Exercises R shoulder ABCs x1 rep in recline     Shoulder Exercises: Sidelying   External Rotation AROM;Right;20 reps   Internal Rotation AROM;Right;20 reps   Flexion AROM;Right;20 reps     Shoulder Exercises: Standing   Other Standing Exercises kneeling RUE AROM extension and row x20 reps each   Other Standing Exercises Standing RUE wall ladder x15 reps     Shoulder Exercises: Pulleys   Flexion Other (comment)  109min   Other Pulley Exercises standing UE ranger flex, CW, CCW x 20 each     Moist Heat Therapy   Number Minutes Moist Heat 15 Minutes   Moist Heat Location Shoulder  Theme park manager R shoulder/UT   Chartered certified accountant IFC   Electrical Stimulation Parameters 1-10hz  x5min   Electrical Stimulation Goals Pain     Manual Therapy   Manual Therapy Passive ROM   Passive ROM PROM of R shoulder into flex/ER/IR with gentle holds at end range                     PT Long Term Goals - 04/29/16 1035      PT LONG TERM GOAL #1   Title Independent with HEP.   Time 4   Period Weeks   Status Achieved     PT LONG TERM GOAL #2   Title Active shoulder flexion to 145 degrees so the patient can easily reach overhead.   Time 4   Period Weeks   Status On-going  AROM 100 degrees 04/29/16     PT LONG TERM GOAL #3   Title Active ER to 70 degrees+ to allow for easily donning/doffing of apparel.   Time 4   Period Weeks   Status On-going  AROM 35 degrees 04/29/16     PT LONG TERM  GOAL #4   Title Increase ROM so patient is able to reach behind back to L4.   Time 4   Period Weeks   Status On-going     PT LONG TERM GOAL #5   Title Increase right shoulder strength to a solid 4+/5 to increase stability for performance of functional activities   Time 4   Period Weeks   Status On-going               Plan - 04/29/16 1036    Clinical Impression Statement Patient tolerated treatment with no pain complaints today. Patient able to progress exercises with good technique and progression. Patient did require cues for ROM exercises for full range within her comfort and WNL. Patient did not improve ROM and has decreased range today. Patient reported that she has not been doing self ROM. Educated patient on continued daily ROM using cane in supine for flexion and ER to improve range and functional independence. Patient current goals ongoing due to ROM, strength and pain deficits.   Rehab Potential Good   PT Frequency 3x / week   PT Duration 4 weeks   PT Treatment/Interventions ADLs/Self Care Home Management;Cryotherapy;Electrical Stimulation;Therapeutic activities;Therapeutic exercise;Patient/family education;Passive range of motion;Manual lymph drainage   PT Next Visit Plan cont with POC per MPT and protocol (under media) (MD.Supple 05/12/16)   Consulted and Agree with Plan of Care Patient      Patient will benefit from skilled therapeutic intervention in order to improve the following deficits and impairments:  Pain, Decreased activity tolerance, Decreased range of motion  Visit Diagnosis: Stiffness of left shoulder, not elsewhere classified  Acute pain of left shoulder     Problem List Patient Active Problem List   Diagnosis Date Noted  . HLD (hyperlipidemia) 02/13/2013  . Need for prophylactic vaccination and inoculation against influenza 02/13/2013  . Hypothyroidism 08/04/2012  . Osteopenia 08/12/2010  . Breast CA ([place]) 08/12/2010  . Hyperlipidemia  08/12/2010  . HTN (hypertension) 08/12/2010    Rylann Munford P, PTA 04/29/2016, 10:46 AM  [facility] [place], [place], [address] Phone: [phone]   Fax:  [phone]  Name: April Weber MRN: [id number] Date of Birth: August 15, 1945

## 2016-05-05 ENCOUNTER — Ambulatory Visit: Payer: Medicare Other | Admitting: Physical Therapy

## 2016-05-05 ENCOUNTER — Encounter: Payer: Self-pay | Admitting: Physical Therapy

## 2016-05-05 DIAGNOSIS — M25612 Stiffness of left shoulder, not elsewhere classified: Secondary | ICD-10-CM | POA: Diagnosis not present

## 2016-05-05 DIAGNOSIS — M25512 Pain in left shoulder: Secondary | ICD-10-CM | POA: Diagnosis not present

## 2016-05-05 NOTE — Therapy (Signed)
[facility] [place], [place], [address] Phone: [phone]   Fax:  [phone]  Physical Therapy Treatment  Patient Details  Name: April Weber MRN: [id number] Date of Birth: [birth year] Referring Provider: Justice Britain MD  Encounter Date: 05/05/2016      PT End of Session - 05/05/16 0949    Visit Number 17   Number of Visits 24   Date for PT Re-Evaluation 07/05/16   Authorization Type surgery 02/06/16 current 12 weeks 04/30/16   PT Start Time 0944   PT Stop Time 1047   PT Time Calculation (min) 63 min   Activity Tolerance Patient tolerated treatment well   Behavior During Therapy [facility] for tasks assessed/performed      Past Medical History:  Diagnosis Date  . Allergy    seasonal  . Chronic kidney disease    has one kidney  . Hyperlipidemia   . Hypertension   . Osteopenia   . Thyroid disease     Past Surgical History:  Procedure Laterality Date  . ABDOMINAL HYSTERECTOMY    . APPENDECTOMY    . EYE SURGERY     70 years old  . MASTECTOMY Right 1984  . THYROID SURGERY      There were no vitals filed for this visit.      Subjective Assessment - 05/05/16 0951    Subjective Reports soreness in R shoulder today. Reports loosening up of shoulder with continued exercises with cane.   Pertinent History H/o lymphedema of right UE.   Patient Stated Goals Get back to normal function.   Currently in Pain? Yes   Pain Score 2    Pain Location Shoulder   Pain Orientation Right   Pain Descriptors / Indicators Sore   Pain Type Surgical pain   Pain Onset More than a month ago            [facility] PT Assessment - 05/05/16 0001      Assessment   Medical Diagnosis Right rotator cuff repair.   Onset Date/Surgical Date 02/06/16   Next MD Visit 05/12/2016     Restrictions   Weight Bearing Restrictions No                     [facility] Adult PT Treatment/Exercise - 05/05/16 0001      Elbow Exercises    Elbow Flexion Strengthening;Right;Other reps (comment);Seated;Bar weights/barbell  2#     Shoulder Exercises: Supine   Protraction Strengthening;Right;20 reps;Weights   Protraction Weight (lbs) 1     Shoulder Exercises: Sidelying   External Rotation Strengthening;Right;20 reps;Weights   External Rotation Weight (lbs) 1   Internal Rotation Strengthening;Right;20 reps;Weights   Internal Rotation Weight (lbs) 1     Shoulder Exercises: Standing   External Rotation Strengthening;Right;20 reps;Theraband   Theraband Level (Shoulder External Rotation) Level 1 (Yellow)   Internal Rotation Strengthening;Right;20 reps;Theraband   Theraband Level (Shoulder Internal Rotation) Level 1 (Yellow)   Flexion Strengthening;Both;20 reps;Weights   Shoulder Flexion Weight (lbs) 1   Extension Strengthening;Right;20 reps;Theraband   Theraband Level (Shoulder Extension) Level 1 (Yellow)   Row Strengthening;Right;20 reps;Theraband   Theraband Level (Shoulder Row) Level 1 (Yellow)   Other Standing Exercises kneeling RUE AROM extension and row x20 reps each 1#   Other Standing Exercises Standing wall slides x20 reps, B shoulder scaption 1# x20 reps     Shoulder Exercises: Pulleys   Flexion Other (comment)  x4 min   Other Pulley Exercises standing UE ranger  flex, CW, CCW x 20 each     Modalities   Modalities Electrical Stimulation;Moist Heat     Moist Heat Therapy   Number Minutes Moist Heat 15 Minutes   Moist Heat Location Shoulder     Electrical Stimulation   Electrical Stimulation Location R shoulder/UT   Electrical Stimulation Action IFC   Electrical Stimulation Parameters 1-10 hz x15 min   Electrical Stimulation Goals Pain     Manual Therapy   Manual Therapy Passive ROM   Passive ROM PROM of R shoulder into flex/ER/IR with gentle holds at end range                PT Education - 05/05/16 1042    Education provided Yes   Education Details HEP- wall slides, RW4 with yellow theraband,  towel stretch   Person(s) Educated Patient   Methods Explanation;Demonstration;Verbal cues;Handout;Other (comment)  written instructions   Comprehension Verbalized understanding;Returned demonstration;Verbal cues required;Need further instruction             PT Long Term Goals - 04/29/16 1035      PT LONG TERM GOAL #1   Title Independent with HEP.   Time 4   Period Weeks   Status Achieved     PT LONG TERM GOAL #2   Title Active shoulder flexion to 145 degrees so the patient can easily reach overhead.   Time 4   Period Weeks   Status On-going  AROM 100 degrees 04/29/16     PT LONG TERM GOAL #3   Title Active ER to 70 degrees+ to allow for easily donning/doffing of apparel.   Time 4   Period Weeks   Status On-going  AROM 35 degrees 04/29/16     PT LONG TERM GOAL #4   Title Increase ROM so patient is able to reach behind back to L4.   Time 4   Period Weeks   Status On-going     PT LONG TERM GOAL #5   Title Increase right shoulder strength to a solid 4+/5 to increase stability for performance of functional activities   Time 4   Period Weeks   Status On-going               Plan - 05/05/16 1050    Clinical Impression Statement Patient presented in clinic with continued R shoulder soreness but no other complaints of increased pain during treatment. Patient experienced R UT, lats and minimal chest tightness with today's wall slides. Patient guided through R shoulder theraband strengthening exercises with tactile and verbal cueing throughout for appropriate technique and corrections. Patient guided through initial R shoulder strengthening with handweights as well with multimodal cueing throughout for proper technique and any corrections. Firm end feels noted with PROM of R shoulder into all directions assessed. Normal modalities response noted following removal of modalities in sitting. Patient educated regarding new HEP with written instructions to continue AAROM  flexion and ER exercises as well as AROM flexion reclined. Patient verbalized understanding of whole education given today regarding HEP additions.   Rehab Potential Good   PT Frequency 3x / week   PT Duration 4 weeks   PT Treatment/Interventions ADLs/Self Care Home Management;Cryotherapy;Electrical Stimulation;Therapeutic activities;Therapeutic exercise;Patient/family education;Passive range of motion;Manual lymph drainage   PT Next Visit Plan cont with POC per MPT and protocol with modalities PRN for pain. Guide patient through ER stretch in doorway.   PT Home Exercise Plan HEP- pulleys, wall ladder, AROM supine flexion, SL flexion and SL ER; wall  slides, RW4 with yellow theraband, towel stretch   Consulted and Agree with Plan of Care Patient      Patient will benefit from skilled therapeutic intervention in order to improve the following deficits and impairments:  Pain, Decreased activity tolerance, Decreased range of motion  Visit Diagnosis: Stiffness of left shoulder, not elsewhere classified  Acute pain of left shoulder     Problem List Patient Active Problem List   Diagnosis Date Noted  . HLD (hyperlipidemia) 02/13/2013  . Need for prophylactic vaccination and inoculation against influenza 02/13/2013  . Hypothyroidism 08/04/2012  . Osteopenia 08/12/2010  . Breast CA ([place]) 08/12/2010  . Hyperlipidemia 08/12/2010  . HTN (hypertension) 08/12/2010    [name], PTA 05/05/2016, 11:03 AM  [facility] [address] [place], [place], [address] Phone: [phone]   Fax:  [phone]  Name: April Weber MRN: [id number] Date of Birth: Apr 27, 1946

## 2016-05-05 NOTE — Patient Instructions (Addendum)
ROM: Flexion (Alternate)    Slide right arm up wall, with palm out, by leaning toward wall. Hold _5___ seconds. Repeat __10__ times per set. Do __2__ sets per session. Do __2-3__ sessions per day.  http://orth.exer.us/758   Copyright  VHI. All rights reserved.  Strengthening: Resisted External Rotation    Hold tubing in right hand, elbow at side and forearm across body. Rotate forearm out. Repeat __10__ times per set. Do _2___ sets per session. Do __2-3__ sessions per day.  http://orth.exer.us/828   Copyright  VHI. All rights reserved.  Strengthening: Resisted Internal Rotation    Hold tubing inright hand, elbow at side and forearm out. Rotate forearm in across body. Repeat __10__ times per set. Do _2___ sets per session. Do _2-3___ sessions per day.  http://orth.exer.us/830   Copyright  VHI. All rights reserved.  Strengthening: Resisted Extension    Hold tubing in right hand, arm forward. Pull arm back, elbow straight. Repeat ___10_ times per set. Do __2__ sets per session. Do __2-3__ sessions per day.  http://orth.exer.us/832   Copyright  VHI. All rights reserved.  ROM: Towel Stretch - with Interior Rotation    Pull right arm up behind back by pulling towel up with left arm. Hold __30__ seconds. Repeat __3__ times per set. Do ____ sets per session. Do _2-3___ sessions per day.  http://orth.exer.us/888   Copyright  VHI. All rights reserved.  Rowing: Resisted (Sitting)    With resistive band in door jam, right hand firmly holding end. Pull elbow back. Repeat __10__ times per set. Do __2__ sets per session. Do _2-3___ sessions per day.  http://orth.exer.us/184   Copyright  VHI. All rights reserved.

## 2016-05-07 ENCOUNTER — Encounter: Payer: Self-pay | Admitting: Physical Therapy

## 2016-05-07 ENCOUNTER — Ambulatory Visit: Payer: Medicare Other | Admitting: Physical Therapy

## 2016-05-07 DIAGNOSIS — M25612 Stiffness of left shoulder, not elsewhere classified: Secondary | ICD-10-CM

## 2016-05-07 DIAGNOSIS — M25512 Pain in left shoulder: Secondary | ICD-10-CM

## 2016-05-07 NOTE — Patient Instructions (Addendum)
Flexibility: Corner Stretch    Standing in corner with hands just above shoulder level, lean forward until a comfortable stretch is felt across chest. Hold __30__ seconds. Repeat __3__ times per set. Do __2-3__ sessions per day.  http://orth.exer.us/342   Copyright  VHI. All rights reserved.

## 2016-05-07 NOTE — Therapy (Signed)
[facility] [place], [place], [address] Phone: [phone]   Fax:  [phone]  Physical Therapy Treatment  Patient Details  Name: April Weber MRN: [id number] Date of Birth: [birth year] Referring Provider: Justice Britain MD  Encounter Date: 05/07/2016      PT End of Session - 05/07/16 0945    Visit Number 18   Number of Visits 24   Date for PT Re-Evaluation 07/05/16   PT Start Time 0944   PT Stop Time 1040   PT Time Calculation (min) 56 min   Activity Tolerance Patient tolerated treatment well   Behavior During Therapy [facility] for tasks assessed/performed      Past Medical History:  Diagnosis Date  . Allergy    seasonal  . Chronic kidney disease    has one kidney  . Hyperlipidemia   . Hypertension   . Osteopenia   . Thyroid disease     Past Surgical History:  Procedure Laterality Date  . ABDOMINAL HYSTERECTOMY    . APPENDECTOMY    . EYE SURGERY     70 years old  . MASTECTOMY Right 1984  . THYROID SURGERY      There were no vitals filed for this visit.      Subjective Assessment - 05/07/16 0943    Subjective Reports muscle soreness due to treatment. Reports that she is going to try to tuck her R hand into her back pocket around home to help with IR.    Pertinent History H/o lymphedema of right UE.   Patient Stated Goals Get back to normal function.   Currently in Pain? Yes   Pain Score 2    Pain Location Shoulder   Pain Orientation Right   Pain Descriptors / Indicators Sore   Pain Type Surgical pain   Pain Onset More than a month ago            [facility] PT Assessment - 05/07/16 0001      Assessment   Medical Diagnosis Right rotator cuff repair.   Onset Date/Surgical Date 02/06/16   Next MD Visit 05/12/2016     Restrictions   Weight Bearing Restrictions No                     [facility] Adult PT Treatment/Exercise - 05/07/16 0001      Elbow Exercises   Elbow Flexion  Strengthening;Right;Other reps (comment);Seated;Bar weights/barbell  2# 3x10 reps     Shoulder Exercises: Supine   Protraction Strengthening;Right;20 reps;Weights   Protraction Weight (lbs) 2     Shoulder Exercises: Sidelying   External Rotation Strengthening;Right;20 reps;Weights   External Rotation Weight (lbs) 1   Internal Rotation Strengthening;Right;20 reps;Weights   Internal Rotation Weight (lbs) 2     Shoulder Exercises: Standing   External Rotation Strengthening;Right;20 reps;Theraband   Theraband Level (Shoulder External Rotation) Level 1 (Yellow)   Internal Rotation Strengthening;Right;20 reps;Theraband   Theraband Level (Shoulder Internal Rotation) Level 1 (Yellow)   Flexion Strengthening;Both;20 reps;Weights   Shoulder Flexion Weight (lbs) 1   Extension Strengthening;Right;20 reps;Theraband   Theraband Level (Shoulder Extension) Level 1 (Yellow)   Row Strengthening;Right;5 reps;Theraband   Theraband Level (Shoulder Row) Level 1 (Yellow)   Other Standing Exercises kneeling RUE extension and row x20 reps each 1#  Attempted reps with 2# for progression next week   Other Standing Exercises Standing wall slides x20 reps, B shoulder scaption 1# x20 reps     Shoulder Exercises: Pulleys   Flexion  Other (comment)  x5 min     Shoulder Exercises: Stretch   Internal Rotation Stretch 3 reps  30 sec   External Rotation Stretch 3 reps;30 seconds     Modalities   Modalities Electrical Stimulation;Moist Heat     Moist Heat Therapy   Number Minutes Moist Heat 15 Minutes   Moist Heat Location Shoulder     Electrical Stimulation   Electrical Stimulation Location R shoulder   Electrical Stimulation Action IFC   Electrical Stimulation Parameters 1-10 hz x15 min   Electrical Stimulation Goals Pain                PT Education - 05/07/16 1033    Education provided Yes   Education Details HEP- corner stretch   Person(s) Educated Patient   Methods  Explanation;Demonstration;Verbal cues;Handout   Comprehension Verbalized understanding;Returned demonstration;Verbal cues required             PT Long Term Goals - 04/29/16 1035      PT LONG TERM GOAL #1   Title Independent with HEP.   Time 4   Period Weeks   Status Achieved     PT LONG TERM GOAL #2   Title Active shoulder flexion to 145 degrees so the patient can easily reach overhead.   Time 4   Period Weeks   Status On-going  AROM 100 degrees 04/29/16     PT LONG TERM GOAL #3   Title Active ER to 70 degrees+ to allow for easily donning/doffing of apparel.   Time 4   Period Weeks   Status On-going  AROM 35 degrees 04/29/16     PT LONG TERM GOAL #4   Title Increase ROM so patient is able to reach behind back to L4.   Time 4   Period Weeks   Status On-going     PT LONG TERM GOAL #5   Title Increase right shoulder strength to a solid 4+/5 to increase stability for performance of functional activities   Time 4   Period Weeks   Status On-going               Plan - 05/07/16 1034    Clinical Impression Statement Patient arrived to treatment with muscle soreness from protocol advancement but again guided through gentle shoulder stretching for ROM and resisted strengthening with different modes of cueing such as tactile, verbal and demonstration for proper technique of ER as well as row and extension. Patient asked questions throughout treatment regarding HEP technique and questions were answered and demonstrated throughout the treatment. Patient was advanced in resistance in some exercises today that 1# was not a challenge without complaint. Patient was provided corner stretch for HEP for improvement in ROM. Normal modalities response noted following removal of the modalities.   Rehab Potential Good   PT Frequency 3x / week   PT Duration 4 weeks   PT Treatment/Interventions ADLs/Self Care Home Management;Cryotherapy;Electrical Stimulation;Therapeutic  activities;Therapeutic exercise;Patient/family education;Passive range of motion;Manual lymph drainage   PT Next Visit Plan cont with POC per MPT and protocol with modalities PRN for pain. Progress to 2# for row, ext, serratus, and IR and MD note required next treatment.   PT Home Exercise Plan HEP- pulleys, wall ladder, AROM supine flexion, SL flexion and SL ER; wall slides, RW4 with yellow theraband, towel stretch   Consulted and Agree with Plan of Care Patient      Patient will benefit from skilled therapeutic intervention in order to improve the following deficits  and impairments:  Pain, Decreased activity tolerance, Decreased range of motion  Visit Diagnosis: Stiffness of left shoulder, not elsewhere classified  Acute pain of left shoulder     Problem List Patient Active Problem List   Diagnosis Date Noted  . HLD (hyperlipidemia) 02/13/2013  . Need for prophylactic vaccination and inoculation against influenza 02/13/2013  . Hypothyroidism 08/04/2012  . Osteopenia 08/12/2010  . Breast CA ([place]) 08/12/2010  . Hyperlipidemia 08/12/2010  . HTN (hypertension) 08/12/2010    [name], PTA 05/07/2016, 10:44 AM  [facility] [address] [place], [place], [address] Phone: [phone]   Fax:  [phone]  Name: April Weber MRN: [id number] Date of Birth: 13-May-1945

## 2016-05-11 ENCOUNTER — Ambulatory Visit: Payer: Medicare Other | Attending: Orthopedic Surgery | Admitting: Physical Therapy

## 2016-05-11 ENCOUNTER — Encounter: Payer: Self-pay | Admitting: Physical Therapy

## 2016-05-11 DIAGNOSIS — M25512 Pain in left shoulder: Secondary | ICD-10-CM | POA: Diagnosis not present

## 2016-05-11 DIAGNOSIS — M25612 Stiffness of left shoulder, not elsewhere classified: Secondary | ICD-10-CM | POA: Diagnosis not present

## 2016-05-11 NOTE — Therapy (Signed)
[facility] [place], [place], [address] Phone: [phone]   Fax:  [phone]  Physical Therapy Treatment  Patient Details  Name: April Weber MRN: [id number] Date of Birth: 08-Dec-1945 Referring Provider: Justice Britain MD  Encounter Date: 05/11/2016      PT End of Session - 05/11/16 0939    Visit Number 19   Number of Visits 24   Date for PT Re-Evaluation 07/05/16   PT Start Time 0945   PT Stop Time 1048   PT Time Calculation (min) 63 min   Activity Tolerance Patient tolerated treatment well   Behavior During Therapy [facility] for tasks assessed/performed      Past Medical History:  Diagnosis Date  . Allergy    seasonal  . Chronic kidney disease    has one kidney  . Hyperlipidemia   . Hypertension   . Osteopenia   . Thyroid disease     Past Surgical History:  Procedure Laterality Date  . ABDOMINAL HYSTERECTOMY    . APPENDECTOMY    . EYE SURGERY     71 years old  . MASTECTOMY Right 1984  . THYROID SURGERY      There were no vitals filed for this visit.      Subjective Assessment - 05/11/16 0939    Subjective Reports that she continues to have stiffness as well as soreness and tenderness especially this weekend which she correlates to new exercises.   Pertinent History H/o lymphedema of right UE.   Patient Stated Goals Get back to normal function.   Currently in Pain? Yes   Pain Location Shoulder   Pain Orientation Right   Pain Descriptors / Indicators Sore;Tender   Pain Type Surgical pain   Pain Onset More than a month ago            [facility] PT Assessment - 05/11/16 0001      Assessment   Medical Diagnosis Right rotator cuff repair.   Onset Date/Surgical Date 02/06/16   Next MD Visit 05/12/2016     Restrictions   Weight Bearing Restrictions No     ROM / Strength   AROM / PROM / Strength AROM     AROM   AROM Assessment Site Shoulder   Right/Left Shoulder Right   Right Shoulder Flexion  112 Degrees   Right Shoulder Internal Rotation 64 Degrees   Right Shoulder External Rotation 64 Degrees                     [facility] Adult PT Treatment/Exercise - 05/11/16 0001      Shoulder Exercises: Sidelying   External Rotation Strengthening;Right;20 reps;Weights   External Rotation Weight (lbs) 2   Internal Rotation Strengthening;Right;20 reps;Weights   Internal Rotation Weight (lbs) 2     Shoulder Exercises: Standing   Horizontal ABduction Strengthening;Right;20 reps;Weights  partial ROM per VCs   Horizontal ABduction Weight (lbs) 1   External Rotation Strengthening;Right;20 reps;Theraband   Theraband Level (Shoulder External Rotation) Level 1 (Yellow)   Internal Rotation Strengthening;Right;20 reps;Theraband   Theraband Level (Shoulder Internal Rotation) Level 1 (Yellow)   Flexion Strengthening;Right;20 reps;Weights   Shoulder Flexion Weight (lbs) 1   Extension Strengthening;Right;20 reps;Theraband   Theraband Level (Shoulder Extension) Level 1 (Yellow)   Row Strengthening;Right;5 reps;Theraband   Theraband Level (Shoulder Row) Level 1 (Yellow)   Other Standing Exercises kneeling RUE extension and row x20 reps each 2#   Other Standing Exercises Standing wall slides x20 reps, R shoulder  scaption 1# x20 reps     Shoulder Exercises: Pulleys   Flexion Other (comment)  x6 min   Other Pulley Exercises standing UE ranger flex, CW, CCW x 20 each     Shoulder Exercises: Stretch   Corner Stretch 3 reps;30 seconds   Internal Rotation Stretch 3 reps  x30 sec     Modalities   Modalities Electrical Stimulation;Moist Heat     Moist Heat Therapy   Number Minutes Moist Heat 15 Minutes   Moist Heat Location Shoulder     Electrical Stimulation   Electrical Stimulation Location R shoulder   Electrical Stimulation Action IFC   Electrical Stimulation Parameters 1-10 hz x15 min   Electrical Stimulation Goals Pain     Manual Therapy   Manual Therapy Passive ROM    Passive ROM PROM of R shoulder into flex/ER/IR with gentle holds at end range                     PT Long Term Goals - 05/11/16 1145      PT LONG TERM GOAL #1   Title Independent with HEP.   Time 4   Period Weeks   Status Achieved     PT LONG TERM GOAL #2   Title Active shoulder flexion to 145 degrees so the patient can easily reach overhead.   Time 4   Period Weeks   Status On-going  AROM R shoulder flex in sitting 112 degrees 05/11/2016     PT LONG TERM GOAL #3   Title Active ER to 70 degrees+ to allow for easily donning/doffing of apparel.   Time 4   Period Weeks   Status On-going  AROM R shoulder 64 deg 05/11/2016     PT LONG TERM GOAL #4   Title Increase ROM so patient is able to reach behind back to L4.   Time 4   Period Weeks   Status On-going     PT LONG TERM GOAL #5   Title Increase right shoulder strength to a solid 4+/5 to increase stability for performance of functional activities   Time 4   Period Weeks   Status On-going               Plan - 05/11/16 1137    Clinical Impression Statement Patient arrived to treatment with reports of soreness and tenderness continued in R shoulder. Patient able to progress within protocol but also able to now sleep in bed with pillows under her arm due to R shoulder IR limitation. Patient continues to require moderate multimodal cueing and education regarding the different strengthening and stretching exercises and correct any deviation. Patient again educated regarding HEP and technique for HEP exercises by PTA. Patient intermittantly reported feeling R shoulder "pull" but no discomfort or pain. AROM R shoulder flexion in sitting 112 deg, ER 64 deg, IR 64 deg. R shoulder MMT not assessed at this time as strengthening has just been started. Normal modalities response noted following removal of the modalities.   Rehab Potential Good   PT Frequency 3x / week   PT Duration 4 weeks   PT Treatment/Interventions  ADLs/Self Care Home Management;Cryotherapy;Electrical Stimulation;Therapeutic activities;Therapeutic exercise;Patient/family education;Passive range of motion;Manual lymph drainage   PT Next Visit Plan cont with POC per MPT and protocol with modalities PRN for pain per MD and PT discretion.   PT Home Exercise Plan HEP- pulleys, wall ladder, AROM supine flexion, SL flexion and SL ER; wall slides, RW4 with yellow theraband,  towel stretch   Consulted and Agree with Plan of Care Patient      Patient will benefit from skilled therapeutic intervention in order to improve the following deficits and impairments:  Pain, Decreased activity tolerance, Decreased range of motion  Visit Diagnosis: Stiffness of left shoulder, not elsewhere classified  Acute pain of left shoulder     Problem List Patient Active Problem List   Diagnosis Date Noted  . HLD (hyperlipidemia) 02/13/2013  . Need for prophylactic vaccination and inoculation against influenza 02/13/2013  . Hypothyroidism 08/04/2012  . Osteopenia 08/12/2010  . Breast CA ([place]) 08/12/2010  . Hyperlipidemia 08/12/2010  . HTN (hypertension) 08/12/2010    Ahmed Prima, PTA 05/11/16 12:07 PM Mali Applegate MPT [facility] [address] [place], [place], [address] Phone: [phone]   Fax:  [phone]  Name: April Weber MRN: [id number] Date of Birth: 08-04-1945

## 2016-05-12 DIAGNOSIS — S46811D Strain of other muscles, fascia and tendons at shoulder and upper arm level, right arm, subsequent encounter: Secondary | ICD-10-CM | POA: Diagnosis not present

## 2016-05-12 DIAGNOSIS — Z4789 Encounter for other orthopedic aftercare: Secondary | ICD-10-CM | POA: Diagnosis not present

## 2016-05-13 ENCOUNTER — Ambulatory Visit: Payer: Medicare Other | Admitting: *Deleted

## 2016-05-13 DIAGNOSIS — M25512 Pain in left shoulder: Secondary | ICD-10-CM

## 2016-05-13 DIAGNOSIS — M25612 Stiffness of left shoulder, not elsewhere classified: Secondary | ICD-10-CM

## 2016-05-13 NOTE — Therapy (Signed)
[facility] [place], [place], [address] Phone: [phone]   Fax:  [phone]  Physical Therapy Treatment  Patient Details  Name: April Weber MRN: [id number] Date of Birth: [birth year] Referring Provider: Justice Britain MD  Encounter Date: 05/13/2016      PT End of Session - 05/13/16 1451    Visit Number 20   Number of Visits 24   Date for PT Re-Evaluation 07/05/16   Authorization Type surgery 02/06/16 current 12 weeks 04/30/16   PT Start Time 0945   PT Stop Time 1045   PT Time Calculation (min) 60 min   Activity Tolerance Patient tolerated treatment well   Behavior During Therapy [facility] for tasks assessed/performed      Past Medical History:  Diagnosis Date  . Allergy    seasonal  . Chronic kidney disease    has one kidney  . Hyperlipidemia   . Hypertension   . Osteopenia   . Thyroid disease     Past Surgical History:  Procedure Laterality Date  . ABDOMINAL HYSTERECTOMY    . APPENDECTOMY    . EYE SURGERY     71 years old  . MASTECTOMY Right 1984  . THYROID SURGERY      There were no vitals filed for this visit.      Subjective Assessment - 05/13/16 0949    Subjective MD wants me to continue for 1 more month of PT.  MD F/U on 06-09-16.  I want to get as much as I can out of the next visits for strengthening and ROM   Pertinent History H/o lymphedema of right UE.   Patient Stated Goals Get back to normal function.   Currently in Pain? Yes   Pain Score 2    Pain Location Shoulder   Pain Orientation Right   Pain Descriptors / Indicators Sore;Tender                         [facility] Adult PT Treatment/Exercise - 05/13/16 0001      Elbow Exercises   Elbow Flexion Strengthening;Right;Other reps (comment);Seated;Bar weights/barbell     Shoulder Exercises: Supine   External Rotation PROM;Right;Weights  LLLDS with 1# in scaption x 5 mins. HEP as well     Shoulder Exercises: Prone    Horizontal ABduction 1 Weight (lbs) 1# to 45 degrees 3x 10     Shoulder Exercises: Sidelying   External Rotation Strengthening;Right;20 reps;Weights   External Rotation Weight (lbs) 2   Internal Rotation Strengthening;Right;20 reps;Weights   Internal Rotation Weight (lbs) 2     Shoulder Exercises: Standing   Horizontal ABduction --   Horizontal ABduction Weight (lbs) --   External Rotation Strengthening;Right;20 reps;Theraband   Theraband Level (Shoulder External Rotation) Level 1 (Yellow)  cues for technique   Internal Rotation Strengthening;Right;20 reps;Theraband   Theraband Level (Shoulder Internal Rotation) Level 1 (Yellow)   Extension Strengthening;Right;20 reps;Theraband   Theraband Level (Shoulder Extension) Level 1 (Yellow)   Row Strengthening;Right;5 reps;Theraband   Theraband Level (Shoulder Row) Level 1 (Yellow)   Other Standing Exercises kneeling RUE extension and row x 30 reps each 2#, H     Shoulder Exercises: Pulleys   Flexion Other (comment)  x5 min   Other Pulley Exercises standing UE ranger flex, CW, CCW x 10 each     Modalities   Modalities Electrical Stimulation;Moist Heat     Moist Heat Therapy   Number Minutes Moist Heat --  15  Moist Heat Location Shoulder     Electrical Stimulation   Electrical Stimulation Location R shoulder IFC 80-150hz  x 15 mins   Electrical Stimulation Goals Pain     Manual Therapy   Manual Therapy Passive ROM   Passive ROM PROM of R shoulder into flex/ER/IR with  holds at end range. Focus was on ER                     PT Long Term Goals - 05/11/16 1145      PT LONG TERM GOAL #1   Title Independent with HEP.   Time 4   Period Weeks   Status Achieved     PT LONG TERM GOAL #2   Title Active shoulder flexion to 145 degrees so the patient can easily reach overhead.   Time 4   Period Weeks   Status On-going  AROM R shoulder flex in sitting 112 degrees 05/11/2016     PT LONG TERM GOAL #3   Title Active ER  to 70 degrees+ to allow for easily donning/doffing of apparel.   Time 4   Period Weeks   Status On-going  AROM R shoulder 64 deg 05/11/2016     PT LONG TERM GOAL #4   Title Increase ROM so patient is able to reach behind back to L4.   Time 4   Period Weeks   Status On-going     PT LONG TERM GOAL #5   Title Increase right shoulder strength to a solid 4+/5 to increase stability for performance of functional activities   Time 4   Period Weeks   Status On-going               Plan - 05/13/16 1455    Clinical Impression Statement Pt has a new MD order to continue with PT for RT shldr rehab x 4weeks. She ha sprogressed with protocol for strengthening, but continues to have some ROM deficits especially ER.  Her chief complaints are not being able to use her RT UE to do her hair and reaching behind her back. Increasing  ER ROM was the focus with manual therapy today   Rehab Potential Good   PT Frequency 3x / week   PT Duration 4 weeks   PT Treatment/Interventions ADLs/Self Care Home Management;Cryotherapy;Electrical Stimulation;Therapeutic activities;Therapeutic exercise;Patient/family education;Passive range of motion;Manual lymph drainage   PT Next Visit Plan cont with POC per MPT and protocol with modalities PRN for pain per MD and PT discretion. continue with PROM and strengthening for RT shldr.    PT Home Exercise Plan HEP- pulleys, wall ladder, AROM supine flexion, SL flexion and SL ER; wall slides, RW4 with yellow theraband, towel stretch   Consulted and Agree with Plan of Care Patient      Patient will benefit from skilled therapeutic intervention in order to improve the following deficits and impairments:  Pain, Decreased activity tolerance, Decreased range of motion  Visit Diagnosis: Stiffness of left shoulder, not elsewhere classified  Acute pain of left shoulder     Problem List Patient Active Problem List   Diagnosis Date Noted  . HLD (hyperlipidemia) 02/13/2013   . Need for prophylactic vaccination and inoculation against influenza 02/13/2013  . Hypothyroidism 08/04/2012  . Osteopenia 08/12/2010  . Breast CA ([place]) 08/12/2010  . Hyperlipidemia 08/12/2010  . HTN (hypertension) 08/12/2010    RAMSEUR,CHRIS, PTA 05/13/2016, 3:21 PM  [facility] [address] [place], [place], [address] Phone: [phone]  Fax:  [phone]  Name: April Weber MRN: [id number] Date of Birth: Jun 27, 1945

## 2016-05-18 ENCOUNTER — Ambulatory Visit: Payer: Medicare Other | Admitting: Physical Therapy

## 2016-05-18 ENCOUNTER — Encounter: Payer: Self-pay | Admitting: Physical Therapy

## 2016-05-18 DIAGNOSIS — M25612 Stiffness of left shoulder, not elsewhere classified: Secondary | ICD-10-CM | POA: Diagnosis not present

## 2016-05-18 DIAGNOSIS — M25512 Pain in left shoulder: Secondary | ICD-10-CM

## 2016-05-18 NOTE — Therapy (Signed)
[facility] [place], [place], [address] Phone: [phone]   Fax:  [phone]  Physical Therapy Treatment  Patient Details  Name: April Weber MRN: [id number] Date of Birth: 1945-10-19 Referring Provider: Justice Britain MD  Encounter Date: 05/18/2016      PT End of Session - 05/18/16 0947    Visit Number 21   Number of Visits 24   Date for PT Re-Evaluation 07/05/16   Authorization Type surgery 02/06/16 current 12 weeks 04/30/16   PT Start Time 0946   PT Stop Time 1040   PT Time Calculation (min) 54 min   Activity Tolerance Patient tolerated treatment well   Behavior During Therapy [facility] for tasks assessed/performed      Past Medical History:  Diagnosis Date  . Allergy    seasonal  . Chronic kidney disease    has one kidney  . Hyperlipidemia   . Hypertension   . Osteopenia   . Thyroid disease     Past Surgical History:  Procedure Laterality Date  . ABDOMINAL HYSTERECTOMY    . APPENDECTOMY    . EYE SURGERY     71 years old  . MASTECTOMY Right 1984  . THYROID SURGERY      There were no vitals filed for this visit.      Subjective Assessment - 05/18/16 0946    Subjective Reports that she thinks tightness and stiffness has decreased with ER stretch and corner stretch.   Pertinent History H/o lymphedema of right UE.   Patient Stated Goals Get back to normal function.   Currently in Pain? Other (Comment)  No complaints of current R shoulder pain            [facility] PT Assessment - 05/18/16 0001      Assessment   Medical Diagnosis Right rotator cuff repair.   Onset Date/Surgical Date 02/06/16   Next MD Visit 06/09/2016     Restrictions   Weight Bearing Restrictions No                     [facility] Adult PT Treatment/Exercise - 05/18/16 0001      Shoulder Exercises: Supine   External Rotation PROM;Right;Weights  for anterior capsule/ ER stretch   External Rotation Weight (lbs) 1      Shoulder Exercises: Pulleys   Flexion Other (comment)  x5 min   Other Pulley Exercises standing UE ranger flex, CW, CCW x 20 each     Shoulder Exercises: Stretch   External Rotation Stretch 3 reps;30 seconds   Other Shoulder Stretches R anterior capsule stretch with elbow ext      Modalities   Modalities Electrical Stimulation;Moist Heat     Moist Heat Therapy   Number Minutes Moist Heat 15 Minutes   Moist Heat Location Shoulder     Electrical Stimulation   Electrical Stimulation Location R shoulder   Electrical Stimulation Action IFC   Electrical Stimulation Parameters 1-10 hz x15 min   Electrical Stimulation Goals Pain     Manual Therapy   Manual Therapy Passive ROM   Passive ROM PROM of R shoulder into flex with  holds at end range                     PT Long Term Goals - 05/11/16 1145      PT LONG TERM GOAL #1   Title Independent with HEP.   Time 4   Period Weeks   Status  Achieved     PT LONG TERM GOAL #2   Title Active shoulder flexion to 145 degrees so the patient can easily reach overhead.   Time 4   Period Weeks   Status On-going  AROM R shoulder flex in sitting 112 degrees 05/11/2016     PT LONG TERM GOAL #3   Title Active ER to 70 degrees+ to allow for easily donning/doffing of apparel.   Time 4   Period Weeks   Status On-going  AROM R shoulder 64 deg 05/11/2016     PT LONG TERM GOAL #4   Title Increase ROM so patient is able to reach behind back to L4.   Time 4   Period Weeks   Status On-going     PT LONG TERM GOAL #5   Title Increase right shoulder strength to a solid 4+/5 to increase stability for performance of functional activities   Time 4   Period Weeks   Status On-going               Plan - 05/18/16 1025    Clinical Impression Statement Patient arrived to treatment with reports of relief of some stiffness and tightness of R anterior shoulder and chest. Patient able to tolerate ROM focused treatment well with guidance  from Mali Applegate, MPT. Patient was shown many different anterior shoulder stretches without complaint. Smooth arc of motion of R shoulder into flexion with  no complaints of pain. Normal modalities response noted following removal of the modalities.   Rehab Potential Good   PT Frequency 3x / week   PT Duration 4 weeks   PT Treatment/Interventions ADLs/Self Care Home Management;Cryotherapy;Electrical Stimulation;Therapeutic activities;Therapeutic exercise;Patient/family education;Passive range of motion;Manual lymph drainage   PT Next Visit Plan cont with POC per MPT and protocol with modalities PRN for pain per MD and PT discretion. continue with PROM and strengthening for RT shldr.    PT Home Exercise Plan HEP- pulleys, wall ladder, AROM supine flexion, SL flexion and SL ER; wall slides, RW4 with yellow theraband, towel stretch   Consulted and Agree with Plan of Care Patient      Patient will benefit from skilled therapeutic intervention in order to improve the following deficits and impairments:  Pain, Decreased activity tolerance, Decreased range of motion  Visit Diagnosis: Stiffness of left shoulder, not elsewhere classified  Acute pain of left shoulder     Problem List Patient Active Problem List   Diagnosis Date Noted  . HLD (hyperlipidemia) 02/13/2013  . Need for prophylactic vaccination and inoculation against influenza 02/13/2013  . Hypothyroidism 08/04/2012  . Osteopenia 08/12/2010  . Breast CA ([place]) 08/12/2010  . Hyperlipidemia 08/12/2010  . HTN (hypertension) 08/12/2010    [name], PTA 05/18/2016, 10:42 AM  [facility] [address] [place], [place], [address] Phone: [phone]   Fax:  [phone]  Name: April Weber MRN: [id number] Date of Birth: 72-Apr-1947

## 2016-05-20 ENCOUNTER — Ambulatory Visit: Payer: Medicare Other | Admitting: Physical Therapy

## 2016-05-20 ENCOUNTER — Encounter: Payer: Self-pay | Admitting: Physical Therapy

## 2016-05-20 DIAGNOSIS — M25512 Pain in left shoulder: Secondary | ICD-10-CM

## 2016-05-20 DIAGNOSIS — M25612 Stiffness of left shoulder, not elsewhere classified: Secondary | ICD-10-CM | POA: Diagnosis not present

## 2016-05-20 NOTE — Therapy (Signed)
[facility] [place], [place], [address] Phone: [phone]   Fax:  [phone]  Physical Therapy Treatment  Patient Details  Name: April Weber MRN: [id number] Date of Birth: 27-Mar-1946 Referring Provider: Justice Britain MD  Encounter Date: 05/20/2016      PT End of Session - 05/20/16 0948    Visit Number 22   Number of Visits 24   Date for PT Re-Evaluation 07/05/16   PT Start Time 0944   PT Stop Time 1042   PT Time Calculation (min) 58 min   Activity Tolerance Patient tolerated treatment well   Behavior During Therapy [facility] for tasks assessed/performed      Past Medical History:  Diagnosis Date  . Allergy    seasonal  . Chronic kidney disease    has one kidney  . Hyperlipidemia   . Hypertension   . Osteopenia   . Thyroid disease     Past Surgical History:  Procedure Laterality Date  . ABDOMINAL HYSTERECTOMY    . APPENDECTOMY    . EYE SURGERY     71 years old  . MASTECTOMY Right 1984  . THYROID SURGERY      There were no vitals filed for this visit.      Subjective Assessment - 05/20/16 0945    Subjective Reports that she is tired of doing the same thing over. Reports that yellow theraband is too easy and reports having a lot of questions for next MD appointment. Reports that she is now able to stretch her arm out as in when she was tired.   Pertinent History H/o lymphedema of right UE.   Patient Stated Goals Get back to normal function.   Currently in Pain? Yes   Pain Score 1    Pain Location Shoulder   Pain Orientation Right   Pain Descriptors / Indicators Aching;Discomfort   Pain Type Surgical pain   Pain Onset More than a month ago   Pain Frequency Intermittent   Aggravating Factors  Exercises on occasion            [facility] PT Assessment - 05/20/16 0001      Assessment   Medical Diagnosis Right rotator cuff repair.   Onset Date/Surgical Date 02/06/16   Next MD Visit 06/09/2016     Restrictions   Weight Bearing Restrictions No     ROM / Strength   AROM / PROM / Strength AROM     AROM   Overall AROM  Deficits   AROM Assessment Site Shoulder   Right/Left Shoulder Right   Right Shoulder Flexion 135 Degrees   Right Shoulder Internal Rotation 60 Degrees   Right Shoulder External Rotation 65 Degrees                     [facility] Adult PT Treatment/Exercise - 05/20/16 0001      Shoulder Exercises: Supine   External Rotation AAROM;Both;10 reps  10 sec hold     Shoulder Exercises: Pulleys   Flexion Other (comment)  x5 min   Other Pulley Exercises standing UE ranger flex, CW, CCW x 20 each   Other Pulley Exercises UE ranger ER stretch 10 x10 sec hold     Shoulder Exercises: Stretch   Internal Rotation Stretch 3 reps   Internal Rotation Stretch Limitations x30 sec hold     Modalities   Modalities Electrical Stimulation;Moist Heat     Moist Heat Therapy   Number Minutes Moist Heat 15 Minutes  Moist Heat Location Shoulder     Electrical Stimulation   Electrical Stimulation Location R shoulder   Electrical Stimulation Action IFC   Electrical Stimulation Parameters 1-10 hz x15 min   Electrical Stimulation Goals Pain     Manual Therapy   Manual Therapy Passive ROM   Passive ROM PROM of R shoulder into flex with  holds at end range                     PT Long Term Goals - 05/11/16 1145      PT LONG TERM GOAL #1   Title Independent with HEP.   Time 4   Period Weeks   Status Achieved     PT LONG TERM GOAL #2   Title Active shoulder flexion to 145 degrees so the patient can easily reach overhead.   Time 4   Period Weeks   Status On-going  AROM R shoulder flex in sitting 112 degrees 05/11/2016     PT LONG TERM GOAL #3   Title Active ER to 70 degrees+ to allow for easily donning/doffing of apparel.   Time 4   Period Weeks   Status On-going  AROM R shoulder 64 deg 05/11/2016     PT LONG TERM GOAL #4   Title Increase ROM so  patient is able to reach behind back to L4.   Time 4   Period Weeks   Status On-going     PT LONG TERM GOAL #5   Title Increase right shoulder strength to a solid 4+/5 to increase stability for performance of functional activities   Time 4   Period Weeks   Status On-going               Plan - 05/20/16 1031    Clinical Impression Statement Patient arrived to treatment with reports of intermittant exercise discomfort and only intermittant nerve discomfort in neck region. Patient able to demonstrate improvements with ROM in the ROM focused treatment with new exercises with max at times multimodal cueing for proper techinque. Patient also provided red theraband and VCs for flexion strengthening at home to begin with 1#-2# based on her discretion. Normal modalities response noted following removal of the modalities.   Rehab Potential Good   PT Frequency 3x / week   PT Duration 4 weeks   PT Treatment/Interventions ADLs/Self Care Home Management;Cryotherapy;Electrical Stimulation;Therapeutic activities;Therapeutic exercise;Patient/family education;Passive range of motion;Manual lymph drainage   PT Next Visit Plan cont with POC per MPT and protocol with modalities PRN for pain per MD and PT discretion. continue with PROM and strengthening for RT shldr.    PT Home Exercise Plan HEP- pulleys, wall ladder, AROM supine flexion, SL flexion and SL ER; wall slides, RW4 with yellow theraband, towel stretch   Consulted and Agree with Plan of Care Patient      Patient will benefit from skilled therapeutic intervention in order to improve the following deficits and impairments:  Pain, Decreased activity tolerance, Decreased range of motion  Visit Diagnosis: Stiffness of left shoulder, not elsewhere classified  Acute pain of left shoulder     Problem List Patient Active Problem List   Diagnosis Date Noted  . HLD (hyperlipidemia) 02/13/2013  . Need for prophylactic vaccination and  inoculation against influenza 02/13/2013  . Hypothyroidism 08/04/2012  . Osteopenia 08/12/2010  . Breast CA ([place]) 08/12/2010  . Hyperlipidemia 08/12/2010  . HTN (hypertension) 08/12/2010    [name], PTA 05/20/2016, 11:11 AM  [facility] [place], [place], [address] Phone: [phone]   Fax:  [phone]  Name: April Weber MRN: [id number] Date of Birth: [birth year]

## 2016-05-25 ENCOUNTER — Ambulatory Visit: Payer: Medicare Other | Admitting: Physical Therapy

## 2016-05-25 ENCOUNTER — Encounter: Payer: Self-pay | Admitting: Physical Therapy

## 2016-05-25 DIAGNOSIS — M25512 Pain in left shoulder: Secondary | ICD-10-CM

## 2016-05-25 DIAGNOSIS — M25612 Stiffness of left shoulder, not elsewhere classified: Secondary | ICD-10-CM

## 2016-05-25 NOTE — Therapy (Signed)
[facility] [place], [place], [address] Phone: [phone]   Fax:  [phone]  Physical Therapy Treatment  Patient Details  Name: April Weber MRN: [id number] Date of Birth: 1946-04-29 Referring Provider: Justice Britain MD  Encounter Date: 05/25/2016      PT End of Session - 05/25/16 0945    Visit Number 23   Number of Visits 24   Date for PT Re-Evaluation 07/05/16   Authorization Type surgery 02/06/16 current 12 weeks 04/30/16   PT Start Time 0946   PT Stop Time 1041   PT Time Calculation (min) 55 min   Activity Tolerance Patient tolerated treatment well   Behavior During Therapy [facility] for tasks assessed/performed      Past Medical History:  Diagnosis Date  . Allergy    seasonal  . Chronic kidney disease    has one kidney  . Hyperlipidemia   . Hypertension   . Osteopenia   . Thyroid disease     Past Surgical History:  Procedure Laterality Date  . ABDOMINAL HYSTERECTOMY    . APPENDECTOMY    . EYE SURGERY     71 years old  . MASTECTOMY Right 1984  . THYROID SURGERY      There were no vitals filed for this visit.      Subjective Assessment - 05/25/16 0946    Subjective Reports that she had tenderness over the weekend to which she attributed to use of new red theraband. Reports that after concluding about the red theraband that she backed off exercises and her shoulder is better. Reports that she was able to see improvements in regards to showering and drying off.   Pertinent History H/o lymphedema of right UE.   Patient Stated Goals Get back to normal function.   Currently in Pain? Yes   Pain Score 1    Pain Location Shoulder   Pain Orientation Right   Pain Descriptors / Indicators Tender;Sore   Pain Type Surgical pain   Pain Onset More than a month ago            [facility] PT Assessment - 05/25/16 0001      Assessment   Medical Diagnosis Right rotator cuff repair.   Onset Date/Surgical Date  02/06/16   Next MD Visit 06/09/2016     Restrictions   Weight Bearing Restrictions No     ROM / Strength   AROM / PROM / Strength AROM     AROM   Overall AROM  Deficits   AROM Assessment Site Shoulder   Right/Left Shoulder Right   Right Shoulder Flexion 135 Degrees   Right Shoulder Internal Rotation 60 Degrees   Right Shoulder External Rotation 65 Degrees                     [facility] Adult PT Treatment/Exercise - 05/25/16 0001      Shoulder Exercises: Supine   External Rotation AAROM;Both;20 reps  10 sec hold   Flexion AROM;Both;20 reps   Flexion Limitations 140 deg measured     Shoulder Exercises: Standing   Other Standing Exercises RUE wall slides x20 reps     Shoulder Exercises: Pulleys   Flexion Other (comment)  x5 min   Other Pulley Exercises standing UE ranger flex, CW, CCW x 20 each   Other Pulley Exercises UE ranger ER stretch 10 x10 sec hold     Shoulder Exercises: Stretch   Internal Rotation Stretch 3 reps   Internal  Rotation Stretch Limitations x30 sec hold     Modalities   Modalities Electrical Stimulation;Moist Heat     Moist Heat Therapy   Number Minutes Moist Heat 15 Minutes   Moist Heat Location Shoulder     Electrical Stimulation   Electrical Stimulation Location R shoulder   Electrical Stimulation Action IFC   Electrical Stimulation Parameters 1-10 hz x15 min   Electrical Stimulation Goals Pain     Manual Therapy   Manual Therapy Passive ROM   Passive ROM PROM of R shoulder into flex/ER/IR with  holds at end range                     PT Long Term Goals - 05/11/16 1145      PT LONG TERM GOAL #1   Title Independent with HEP.   Time 4   Period Weeks   Status Achieved     PT LONG TERM GOAL #2   Title Active shoulder flexion to 145 degrees so the patient can easily reach overhead.   Time 4   Period Weeks   Status On-going  AROM R shoulder flex in sitting 112 degrees 05/11/2016     PT LONG TERM GOAL #3   Title  Active ER to 70 degrees+ to allow for easily donning/doffing of apparel.   Time 4   Period Weeks   Status On-going  AROM R shoulder 64 deg 05/11/2016     PT LONG TERM GOAL #4   Title Increase ROM so patient is able to reach behind back to L4.   Time 4   Period Weeks   Status On-going     PT LONG TERM GOAL #5   Title Increase right shoulder strength to a solid 4+/5 to increase stability for performance of functional activities   Time 4   Period Weeks   Status On-going               Plan - 05/25/16 1027    Clinical Impression Statement Patient arrived to treatment with reports of soreness/tenderness in R shoulder although improvements noted by patient. Patient able to continue higher level shoulder stretching without complaint. AROM measurements measured as it was previously although with compensatory strategies ROM improves. Extended holds at end range completed with PROM in all directions assessed. Normal modalities response noted following removal of the modalities.   Rehab Potential Good   PT Frequency 3x / week   PT Duration 4 weeks   PT Treatment/Interventions ADLs/Self Care Home Management;Cryotherapy;Electrical Stimulation;Therapeutic activities;Therapeutic exercise;Patient/family education;Passive range of motion;Manual lymph drainage   PT Next Visit Plan cont with POC per MPT and protocol with modalities PRN for pain per MD and PT discretion. continue with PROM and strengthening for RT shldr.    PT Home Exercise Plan HEP- pulleys, wall ladder, AROM supine flexion, SL flexion and SL ER; wall slides, RW4 with yellow theraband, towel stretch   Consulted and Agree with Plan of Care Patient      Patient will benefit from skilled therapeutic intervention in order to improve the following deficits and impairments:  Pain, Decreased activity tolerance, Decreased range of motion  Visit Diagnosis: Stiffness of left shoulder, not elsewhere classified  Acute pain of left  shoulder     Problem List Patient Active Problem List   Diagnosis Date Noted  . HLD (hyperlipidemia) 02/13/2013  . Need for prophylactic vaccination and inoculation against influenza 02/13/2013  . Hypothyroidism 08/04/2012  . Osteopenia 08/12/2010  . Breast CA ([place])  08/12/2010  . Hyperlipidemia 08/12/2010  . HTN (hypertension) 08/12/2010    [name], PTA 05/25/2016, 10:44 AM  [facility] [address] [place], [place], [address] Phone: [phone]   Fax:  [phone]  Name: NIRALYA SPRATT MRN: [id number] Date of Birth: [birth year]

## 2016-05-27 ENCOUNTER — Encounter: Payer: Medicare Other | Admitting: Physical Therapy

## 2016-06-01 ENCOUNTER — Encounter: Payer: Self-pay | Admitting: Physical Therapy

## 2016-06-01 ENCOUNTER — Ambulatory Visit: Payer: Medicare Other | Admitting: Physical Therapy

## 2016-06-01 DIAGNOSIS — M25612 Stiffness of left shoulder, not elsewhere classified: Secondary | ICD-10-CM

## 2016-06-01 DIAGNOSIS — M25512 Pain in left shoulder: Secondary | ICD-10-CM

## 2016-06-01 NOTE — Therapy (Signed)
[facility] [place], [place], [address] Phone: [phone]   Fax:  [phone]  Physical Therapy Treatment  Patient Details  Name: April Weber MRN: [id number] Date of Birth: 21-Sep-1945 Referring Provider: Justice Britain MD  Encounter Date: 06/01/2016      PT End of Session - 06/01/16 0943    Visit Number 24   Number of Visits 24   Date for PT Re-Evaluation 07/05/16   Authorization Type surgery 02/06/16 current 12 weeks 04/30/16   PT Start Time 0948   PT Stop Time 1035   PT Time Calculation (min) 47 min   Activity Tolerance Patient tolerated treatment well   Behavior During Therapy [facility] for tasks assessed/performed      Past Medical History:  Diagnosis Date  . Allergy    seasonal  . Chronic kidney disease    has one kidney  . Hyperlipidemia   . Hypertension   . Osteopenia   . Thyroid disease     Past Surgical History:  Procedure Laterality Date  . ABDOMINAL HYSTERECTOMY    . APPENDECTOMY    . EYE SURGERY     71 years old  . MASTECTOMY Right 1984  . THYROID SURGERY      There were no vitals filed for this visit.      Subjective Assessment - 06/01/16 0943    Subjective Reports that she thinks she may have jumped a little early with red theraband and has gone back to yellow. Reports that she lifted a light grocery bag to counter with RUE and felt some tenderness afterwards.   Pertinent History H/o lymphedema of right UE.   Patient Stated Goals Get back to normal function.   Currently in Pain? No/denies            [facility] PT Assessment - 06/01/16 0001      Assessment   Medical Diagnosis Right rotator cuff repair.   Onset Date/Surgical Date 02/06/16   Next MD Visit 06/09/2016     Restrictions   Weight Bearing Restrictions No     ROM / Strength   AROM / PROM / Strength AROM     AROM   Overall AROM  Deficits   AROM Assessment Site Shoulder   Right/Left Shoulder Right   Right Shoulder  Flexion 135 Degrees   Right Shoulder External Rotation 66 Degrees                     [facility] Adult PT Treatment/Exercise - 06/01/16 0001      Shoulder Exercises: Supine   External Rotation AAROM;Both;10 reps   Flexion AAROM;Both;20 reps   Flexion Limitations 153 deg flexion measured in supine     Shoulder Exercises: Pulleys   Flexion Other (comment)  x5 min   Other Pulley Exercises standing UE ranger flex, CW, CCW x 20 each   Other Pulley Exercises UE ranger ER stretch 5 x10 sec hold     Modalities   Modalities Electrical Stimulation;Moist Heat     Moist Heat Therapy   Number Minutes Moist Heat 15 Minutes   Moist Heat Location Shoulder     Electrical Stimulation   Electrical Stimulation Location R shoulder   Electrical Stimulation Action Pre-Mod   Electrical Stimulation Parameters 80-150 hz x15 min   Electrical Stimulation Goals Pain     Manual Therapy   Manual Therapy Passive ROM   Passive ROM PROM of R shoulder into ER with  holds at end range  PT Long Term Goals - 05/11/16 1145      PT LONG TERM GOAL #1   Title Independent with HEP.   Time 4   Period Weeks   Status Achieved     PT LONG TERM GOAL #2   Title Active shoulder flexion to 145 degrees so the patient can easily reach overhead.   Time 4   Period Weeks   Status On-going  AROM R shoulder flex in sitting 112 degrees 05/11/2016     PT LONG TERM GOAL #3   Title Active ER to 70 degrees+ to allow for easily donning/doffing of apparel.   Time 4   Period Weeks   Status On-going  AROM R shoulder 64 deg 05/11/2016     PT LONG TERM GOAL #4   Title Increase ROM so patient is able to reach behind back to L4.   Time 4   Period Weeks   Status On-going     PT LONG TERM GOAL #5   Title Increase right shoulder strength to a solid 4+/5 to increase stability for performance of functional activities   Time 4   Period Weeks   Status On-going               Plan  - 06/01/16 1029    Clinical Impression Statement Patient arrived with only tenderness of R shoulder as she may have progressed. Patient guided through stretches with decreased tightness noted throughout treatment. Patient's AAROM measurements improved although AROM ROM against gravity remained relatively the same. Normal modalities response noted following removal of the modalities. Patient educated to listen to her body when it comes to which exercises are most important.   Rehab Potential Good   PT Frequency 3x / week   PT Duration 4 weeks   PT Treatment/Interventions ADLs/Self Care Home Management;Cryotherapy;Electrical Stimulation;Therapeutic activities;Therapeutic exercise;Patient/family education;Passive range of motion;Manual lymph drainage   PT Next Visit Plan cont with POC per MPT and protocol with modalities PRN for pain per MD and PT discretion. continue with PROM and strengthening for RT shldr.    PT Home Exercise Plan HEP- pulleys, wall ladder, AROM supine flexion, SL flexion and SL ER; wall slides, RW4 with yellow theraband, towel stretch   Consulted and Agree with Plan of Care Patient      Patient will benefit from skilled therapeutic intervention in order to improve the following deficits and impairments:  Pain, Decreased activity tolerance, Decreased range of motion  Visit Diagnosis: Stiffness of left shoulder, not elsewhere classified  Acute pain of left shoulder     Problem List Patient Active Problem List   Diagnosis Date Noted  . HLD (hyperlipidemia) 02/13/2013  . Need for prophylactic vaccination and inoculation against influenza 02/13/2013  . Hypothyroidism 08/04/2012  . Osteopenia 08/12/2010  . Breast CA ([place]) 08/12/2010  . Hyperlipidemia 08/12/2010  . HTN (hypertension) 08/12/2010    [name], PTA 06/01/2016, 10:43 AM  [facility] [address] [place], [place], [address] Phone: [phone]   Fax:   [phone]  Name: April Weber MRN: [id number] Date of Birth: [birth year]

## 2016-06-03 ENCOUNTER — Encounter: Payer: Self-pay | Admitting: Physical Therapy

## 2016-06-03 ENCOUNTER — Ambulatory Visit: Payer: Medicare Other | Admitting: Physical Therapy

## 2016-06-03 DIAGNOSIS — M25512 Pain in left shoulder: Secondary | ICD-10-CM

## 2016-06-03 DIAGNOSIS — M25612 Stiffness of left shoulder, not elsewhere classified: Secondary | ICD-10-CM

## 2016-06-03 NOTE — Therapy (Signed)
[facility] [place], [place], [address] Phone: [phone]   Fax:  [phone]  Physical Therapy Treatment  Patient Details  Name: April Weber MRN: [id number] Date of Birth: Oct 10, 1945 Referring Provider: Justice Britain MD  Encounter Date: 06/03/2016      PT End of Session - 06/03/16 0948    Visit Number 25   Number of Visits 24   Date for PT Re-Evaluation 07/05/16   Authorization Type surgery 02/06/16 current 12 weeks 04/30/16   PT Start Time 0946   PT Stop Time 1038   PT Time Calculation (min) 52 min   Activity Tolerance Patient tolerated treatment well   Behavior During Therapy [facility] for tasks assessed/performed      Past Medical History:  Diagnosis Date  . Allergy    seasonal  . Chronic kidney disease    has one kidney  . Hyperlipidemia   . Hypertension   . Osteopenia   . Thyroid disease     Past Surgical History:  Procedure Laterality Date  . ABDOMINAL HYSTERECTOMY    . APPENDECTOMY    . EYE SURGERY     71 years old  . MASTECTOMY Right 1984  . THYROID SURGERY      There were no vitals filed for this visit.      Subjective Assessment - 06/03/16 0946    Subjective Reports that her shoulder is feeling better after going back to yellow theraband although she still has some soreness but not as bad.   Pertinent History H/o lymphedema of right UE.   Patient Stated Goals Get back to normal function.   Currently in Pain? Yes   Pain Score 1    Pain Location Shoulder   Pain Orientation Right   Pain Descriptors / Indicators Sore   Pain Type Surgical pain   Pain Onset More than a month ago            [facility] PT Assessment - 06/03/16 0001      Assessment   Medical Diagnosis Right rotator cuff repair.   Onset Date/Surgical Date 02/06/16   Next MD Visit 06/09/2016     Restrictions   Weight Bearing Restrictions No     ROM / Strength   AROM / PROM / Strength AROM     AROM   Overall AROM   Within functional limits for tasks performed;Deficits   AROM Assessment Site Shoulder   Right/Left Shoulder Right   Right Shoulder Flexion 140 Degrees   Right Shoulder External Rotation 75 Degrees                     [facility] Adult PT Treatment/Exercise - 06/03/16 0001      Shoulder Exercises: Supine   External Rotation AAROM;Both;20 reps   Flexion AAROM;Both;20 reps   Flexion Limitations 160 deg measured in supine     Shoulder Exercises: Pulleys   Flexion Other (comment)  x5 min   Other Pulley Exercises standing UE ranger flex, CW, CCW x 20 each   Other Pulley Exercises UE ranger ER stretch 5 x10 sec hold     Modalities   Modalities Electrical Stimulation;Moist Heat     Moist Heat Therapy   Number Minutes Moist Heat 15 Minutes   Moist Heat Location Shoulder     Electrical Stimulation   Electrical Stimulation Location R shoulder   Electrical Stimulation Action Pre-Mod   Electrical Stimulation Parameters 80-150 hz x15 min   Electrical Stimulation Goals Pain  Manual Therapy   Manual Therapy Passive ROM   Passive ROM PROM of R shoulder into flex/ER with  holds at end range                     PT Long Term Goals - 06/03/16 1025      PT LONG TERM GOAL #1   Title Independent with HEP.   Time 4   Period Weeks   Status Achieved     PT LONG TERM GOAL #2   Title Active shoulder flexion to 145 degrees so the patient can easily reach overhead.   Time 4   Period Weeks   Status Partially Met  AROM R shoulder flex in sitting 140 degrees 06/03/2016     PT LONG TERM GOAL #3   Title Active ER to 70 degrees+ to allow for easily donning/doffing of apparel.   Time 4   Period Weeks   Status Achieved  AROM R shoulder 70 deg 06/03/2016     PT LONG TERM GOAL #4   Title Increase ROM so patient is able to reach behind back to L4.   Time 4   Period Weeks   Status Achieved  L2 as of 06/03/2016     PT LONG TERM GOAL #5   Title Increase right shoulder  strength to a solid 4+/5 to increase stability for performance of functional activities   Time 4   Period Weeks   Status On-going               Plan - 06/03/16 1028    Clinical Impression Statement Patient arrived to treatment with very limited soreness/tenderness. Patient able to complete [facility] exercises/stretches with no difficulty and visible ROM improvements. AROM R shoulder ER 75 deg, 140 deg flexion in sitting. Patient able to achieve L2 IR level today upon palpation/obesrvation. Normal modalities response noted following removal of the modalities. Patient able to achieve ER and IR goals today. Patient educated to continue stretches until her shoulder doesn't feel as tight and she can judge whether to continue stretches with stiffness symptoms.   Rehab Potential Good   PT Frequency 3x / week   PT Duration 4 weeks   PT Treatment/Interventions ADLs/Self Care Home Management;Cryotherapy;Electrical Stimulation;Therapeutic activities;Therapeutic exercise;Patient/family education;Passive range of motion;Manual lymph drainage   PT Next Visit Plan D/C summary with g-code required next treatment with new HEP provided.   PT Home Exercise Plan HEP- pulleys, wall ladder, AROM supine flexion, SL flexion and SL ER; wall slides, RW4 with yellow theraband, towel stretch   Consulted and Agree with Plan of Care Patient      Patient will benefit from skilled therapeutic intervention in order to improve the following deficits and impairments:  Pain, Decreased activity tolerance, Decreased range of motion  Visit Diagnosis: Stiffness of left shoulder, not elsewhere classified  Acute pain of left shoulder     Problem List Patient Active Problem List   Diagnosis Date Noted  . HLD (hyperlipidemia) 02/13/2013  . Need for prophylactic vaccination and inoculation against influenza 02/13/2013  . Hypothyroidism 08/04/2012  . Osteopenia 08/12/2010  . Breast CA ([place]) 08/12/2010  . Hyperlipidemia  08/12/2010  . HTN (hypertension) 08/12/2010    [name], PTA 06/03/2016, 10:49 AM  [facility] [address] [place], [place], [address] Phone: [phone]   Fax:  [phone]  Name: April Weber MRN: [id number] Date of Birth: 1946/04/08

## 2016-06-08 ENCOUNTER — Ambulatory Visit: Payer: Medicare Other | Admitting: Physical Therapy

## 2016-06-08 ENCOUNTER — Encounter: Payer: Self-pay | Admitting: Physical Therapy

## 2016-06-08 DIAGNOSIS — M25612 Stiffness of left shoulder, not elsewhere classified: Secondary | ICD-10-CM

## 2016-06-08 DIAGNOSIS — M25512 Pain in left shoulder: Secondary | ICD-10-CM

## 2016-06-08 NOTE — Therapy (Addendum)
[facility] [place], [place], [address] Phone: [phone]   Fax:  [phone]  Physical Therapy Treatment  Patient Details  Name: April Weber MRN: [id number] Date of Birth: [birth year] Referring Provider: Justice Britain MD  Encounter Date: 06/08/2016      PT End of Session - 06/08/16 0943    Visit Number 26   Number of Visits 24   Date for PT Re-Evaluation 07/05/16   Authorization Type surgery 02/06/16 current 12 weeks 04/30/16   PT Start Time 0945   PT Stop Time 1053   PT Time Calculation (min) 68 min   Activity Tolerance Patient tolerated treatment well   Behavior During Therapy [facility] for tasks assessed/performed      Past Medical History:  Diagnosis Date  . Allergy    seasonal  . Chronic kidney disease    has one kidney  . Hyperlipidemia   . Hypertension   . Osteopenia   . Thyroid disease     Past Surgical History:  Procedure Laterality Date  . ABDOMINAL HYSTERECTOMY    . APPENDECTOMY    . EYE SURGERY     71 years old  . MASTECTOMY Right 1984  . THYROID SURGERY      There were no vitals filed for this visit.      Subjective Assessment - 06/08/16 0943    Subjective Reports that she has a little tenderness and has been doing HEP twice a day and states that she thinks 3 times a day was too much.   Pertinent History H/o lymphedema of right UE.   Patient Stated Goals Get back to normal function.   Currently in Pain? Yes   Pain Score 1    Pain Location Shoulder   Pain Orientation Right;Anterior   Pain Descriptors / Indicators Tender   Pain Type Surgical pain   Pain Onset More than a month ago   Pain Frequency Intermittent            [facility] PT Assessment - 06/08/16 0001      Assessment   Medical Diagnosis Right rotator cuff repair.   Onset Date/Surgical Date 02/06/16   Next MD Visit 06/09/2016     Restrictions   Weight Bearing Restrictions No     ROM / Strength   AROM / PROM / Strength  AROM;Strength     AROM   Overall AROM  Within functional limits for tasks performed   AROM Assessment Site Shoulder   Right/Left Shoulder Right   Right Shoulder Flexion 150 Degrees   Right Shoulder Internal Rotation 64 Degrees   Right Shoulder External Rotation 70 Degrees     Strength   Overall Strength Within functional limits for tasks performed   Strength Assessment Site Shoulder   Right/Left Shoulder Right   Right Shoulder Flexion 5/5   Right Shoulder Extension 5/5   Right Shoulder Internal Rotation 5/5   Right Shoulder External Rotation 5/5                     [facility] Adult PT Treatment/Exercise - 06/08/16 0001      Self-Care   Self-Care Other Self-Care Comments  Taken throughout new HEP and answered questions      Shoulder Exercises: Supine   Other Supine Exercises R shoulder D2 with yellow therabandx x20 reps     Shoulder Exercises: Standing   Other Standing Exercises RUE wall slides x20 reps     Shoulder Exercises: Pulleys   Flexion  Other (comment)  x5 min   Other Pulley Exercises standing UE ranger flex, CW, CCW x 20 each   Other Pulley Exercises UE ranger ER stretch 5 x10 sec hold     Modalities   Modalities Electrical Stimulation;Moist Heat     Moist Heat Therapy   Number Minutes Moist Heat 15 Minutes   Moist Heat Location Shoulder     Electrical Stimulation   Electrical Stimulation Location R shoulder   Electrical Stimulation Action Pre-mod   Electrical Stimulation Parameters 80-150 hz x15 min   Electrical Stimulation Goals Pain                PT Education - 06/08/16 1037    Education provided Yes   Education Details HEP- RW4, standing serratus punch, AAROM flexion and ER   Person(s) Educated Patient   Methods Explanation;Demonstration;Verbal cues;Handout   Comprehension Verbalized understanding;Returned demonstration;Verbal cues required             PT Long Term Goals - 06/08/16 1017      PT LONG TERM GOAL #1    Title Independent with HEP.   Time 4   Period Weeks   Status Achieved     PT LONG TERM GOAL #2   Title Active shoulder flexion to 145 degrees so the patient can easily reach overhead.   Time 4   Period Weeks   Status Achieved  AROM R shoulder flex in sitting 140 degrees 06/03/2016     PT LONG TERM GOAL #3   Title Active ER to 70 degrees+ to allow for easily donning/doffing of apparel.   Time 4   Period Weeks   Status Achieved  AROM R shoulder 70 deg 06/03/2016     PT LONG TERM GOAL #4   Title Increase ROM so patient is able to reach behind back to L4.   Time 4   Period Weeks   Status Achieved  L2 as of 06/03/2016     PT LONG TERM GOAL #5   Title Increase right shoulder strength to a solid 4+/5 to increase stability for performance of functional activities   Time 4   Period Weeks   Status Achieved  5/5 throughout R shoulder MMT 06/08/2016               Plan - 06/08/16 1045    Clinical Impression Statement Patient presented in clinic today with only minimal tenderness of R shoulder. Patient taken through shoulder stretches and new HEP strengthening exercises and provided new bands for HEP. Patient had many questions in regards to HEP progression and how to handle life following PT. Patient educated to really listen to her body on progression by soreness and ease of function. Normal modalities response noted following removal of the modalities. Patient now has achieved all goals set at evaluation.   Rehab Potential Good   PT Frequency 3x / week   PT Duration 4 weeks   PT Treatment/Interventions ADLs/Self Care Home Management;Cryotherapy;Electrical Stimulation;Therapeutic activities;Therapeutic exercise;Patient/family education;Passive range of motion;Manual lymph drainage   PT Next Visit Plan D/C summary and G-Code required.   PT Home Exercise Plan HEP- pulleys, wall ladder, AROM supine flexion, SL flexion and SL ER; wall slides, RW4 with yellow theraband, towel stretch    Consulted and Agree with Plan of Care Patient      Patient will benefit from skilled therapeutic intervention in order to improve the following deficits and impairments:  Pain, Decreased activity tolerance, Decreased range of motion  Visit Diagnosis: Stiffness  of left shoulder, not elsewhere classified  Acute pain of left shoulder     Problem List Patient Active Problem List   Diagnosis Date Noted  . HLD (hyperlipidemia) 02/13/2013  . Need for prophylactic vaccination and inoculation against influenza 02/13/2013  . Hypothyroidism 08/04/2012  . Osteopenia 08/12/2010  . Breast CA ([place]) 08/12/2010  . Hyperlipidemia 08/12/2010  . HTN (hypertension) 08/12/2010    Ahmed Prima, PTA 06/08/16 12:00 PM Mali Applegate MPT [facility] [address] [place], [place], [address] Phone: [phone]   Fax:  [phone]  Name: RUTHANNA MACCHIA MRN: [id number] Date of Birth: 06/25/1945  PHYSICAL THERAPY DISCHARGE SUMMARY  Visits from Start of Care: 26  Current functional level related to goals / functional outcomes: See above.   Remaining deficits: All goals met.   Education / Equipment: HEP. Plan: Patient agrees to discharge.  Patient goals were met. Patient is being discharged due to meeting the stated rehab goals.  ?????         Mali Applegate MPT

## 2016-06-08 NOTE — Patient Instructions (Addendum)
Scapular Retraction: Bilateral    Facing anchor, pull arms back, bringing shoulder blades together. Repeat __10__ times per set. Do _2-3___ sets per session. Do _2-3___ sessions per day.  http://orth.exer.us/176   Copyright  VHI. All rights reserved.  ROM: Flexion - Wand    Bring wand directly over head, leading with right side. Reach back until stretch is felt. Hold __5__ seconds. Repeat _10___ times per set. Do _2___ sets per session. Do _2___ sessions per day.  http://orth.exer.us/744   Copyright  VHI. All rights reserved.  ROM: External / Internal Rotation - Wand    Bring wand up over head, then down toward waistline. Hold each position __5__ seconds. Repeat __10__ times per set. Do __2__ sets per session. Do _2___ sessions per day.  http://orth.exer.us/750   Copyright  VHI. All rights reserved.  Strengthening: Resisted External Rotation    Hold tubing in right hand, elbow at side and forearm across body. Rotate forearm out. Repeat __10__ times per set. Do __2-3__ sets per session. Do __2-3__ sessions per day.  http://orth.exer.us/828   Copyright  VHI. All rights reserved.  Strengthening: Resisted Internal Rotation    Hold tubing in right hand, elbow at side and forearm out. Rotate forearm in across body. Repeat _10___ times per set. Do __2-3__ sets per session. Do __2-3__ sessions per day.  http://orth.exer.us/830   Copyright  VHI. All rights reserved.  Strengthening: Resisted Extension    Hold tubing in right hand, arm forward. Pull arm back, elbow straight. Repeat _10___ times per set. Do __2-3__ sets per session. Do __2-3__ sessions per day.  http://orth.exer.us/832   Copyright  VHI. All rights reserved.  Functional Pattern Strengthening: Serving / Throwing    Hold tubing behind, with right hand. Punch to object in front of you. Repeat __10__ times per set. Do _2-3___ sets per session. Do _2-3___ sessions per  day.  http://orth.exer.us/852   Copyright  VHI. All rights reserved.  PNF Strengthening: Resisted    Standing with resistive band around each hand, bring right arm up and away, thumb back. Repeat __10__ times per set. Do __2-3__ sets per session. Do __2-3__ sessions per day.  http://orth.exer.us/918   Copyright  VHI. All rights reserved.

## 2016-06-09 DIAGNOSIS — Z4789 Encounter for other orthopedic aftercare: Secondary | ICD-10-CM | POA: Diagnosis not present

## 2016-06-09 DIAGNOSIS — S46811D Strain of other muscles, fascia and tendons at shoulder and upper arm level, right arm, subsequent encounter: Secondary | ICD-10-CM | POA: Diagnosis not present

## 2016-06-10 ENCOUNTER — Encounter: Payer: Medicare Other | Admitting: Physical Therapy

## 2016-07-26 ENCOUNTER — Encounter: Payer: Self-pay | Admitting: Family Medicine

## 2016-07-26 ENCOUNTER — Ambulatory Visit (INDEPENDENT_AMBULATORY_CARE_PROVIDER_SITE_OTHER): Payer: Medicare Other | Admitting: Family Medicine

## 2016-07-26 VITALS — BP 167/79 | HR 59 | Temp 96.7°F | Ht 66.5 in | Wt 155.4 lb

## 2016-07-26 DIAGNOSIS — E890 Postprocedural hypothyroidism: Secondary | ICD-10-CM | POA: Diagnosis not present

## 2016-07-26 DIAGNOSIS — Z853 Personal history of malignant neoplasm of breast: Secondary | ICD-10-CM

## 2016-07-26 DIAGNOSIS — I10 Essential (primary) hypertension: Secondary | ICD-10-CM

## 2016-07-26 DIAGNOSIS — E785 Hyperlipidemia, unspecified: Secondary | ICD-10-CM | POA: Diagnosis not present

## 2016-07-26 MED ORDER — ATENOLOL 25 MG PO TABS: 25.0000 mg | ORAL_TABLET | Freq: Every day | ORAL | 1 refills | Status: DC

## 2016-07-26 MED ORDER — SIMVASTATIN 10 MG PO TABS: 10.0000 mg | ORAL_TABLET | Freq: Every day | ORAL | 1 refills | Status: DC

## 2016-07-26 MED ORDER — LEVOTHYROXINE SODIUM 100 MCG PO TABS: 100.0000 ug | ORAL_TABLET | Freq: Every day | ORAL | 1 refills | Status: DC

## 2016-07-26 NOTE — Progress Notes (Signed)
   HPI  Patient presents today here to follow-up for chronic medical conditions.  Hypertension Patient brings in a log of blood pressures with most blood pressures ranging 120 to 1:30 over 70s and 80s. No chest pain, dyspnea, palpitations, leg edema. Taking atenolol twice daily, one half tablet twice daily Cozaar at night.  Needs prescription for breast prosthesis, history of breast cancer.  Patient would like to have a previsit labs in the future.  Patient states that she has white coat hypertension so she always brings in this long.  PMH: Smoking status noted ROS: Per HPI  Objective: BP (!) 167/79   Pulse (!) 59   Temp (!) 96.7 F (35.9 C) (Oral)   Ht 5' 6.5" (1.689 m)   Wt 155 lb 6.4 oz (70.5 kg)   BMI 24.71 kg/m  Gen: NAD, alert, cooperative with exam HEENT: NCAT, PERRLA CV: RRR, good S1/S2, no murmur Resp: CTABL, no wheezes, non-labored Ext: No edema, warm Neuro: Alert and oriented, No gross deficits  Assessment and plan:  # Hypertension Elevated today, 158/81 at its best However her blood pressure log as well controlled No change in medications Labs  # Postoperative hypothyroidism Repeat TSH, refill Synthroid  # Hyperlipidemia Repeat labs, continue simvastatin Clinically stable  # History of breast cancer Handwritten prescription written for breast prosthesis and Bras as requested.     Orders Placed This Encounter  Procedures  . CMP14+EGFR  . CBC with Differential/Platelet  . Lipid panel  . TSH    Meds ordered this encounter  Medications  . atenolol (TENORMIN) 25 MG tablet    Sig: Take 1 tablet (25 mg total) by mouth daily.    Dispense:  90 tablet    Refill:  1    Please consider 90 day supplies to promote better adherence  . levothyroxine (SYNTHROID, LEVOTHROID) 100 MCG tablet    Sig: Take 1 tablet (100 mcg total) by mouth daily.    Dispense:  90 tablet    Refill:  1    Please consider 90 day supplies to promote better adherence  .  simvastatin (ZOCOR) 10 MG tablet    Sig: Take 1 tablet (10 mg total) by mouth at bedtime.    Dispense:  90 tablet    Refill:  [place], MD [facility] 07/26/2016, 8:58 AM

## 2016-07-26 NOTE — Patient Instructions (Signed)
Great to meet you!  Lets plan on following up in 6 month sunless you need [place] soon.   You can certainly call around 2 weeks prior to your appointment for pre-visit labs.

## 2016-07-27 ENCOUNTER — Telehealth: Payer: Self-pay | Admitting: Family Medicine

## 2016-07-27 LAB — CBC WITH DIFFERENTIAL/PLATELET
BASOS ABS: 0 10*3/uL (ref 0.0–0.2)
BASOS: 1 %
EOS (ABSOLUTE): 0.4 10*3/uL (ref 0.0–0.4)
EOS: 6 %
HEMOGLOBIN: 13.8 g/dL (ref 11.1–15.9)
Hematocrit: 41.3 % (ref 34.0–46.6)
IMMATURE GRANS (ABS): 0 10*3/uL (ref 0.0–0.1)
Immature Granulocytes: 0 %
LYMPHS: 18 %
Lymphocytes Absolute: 1.2 10*3/uL (ref 0.7–3.1)
MCH: 29.6 pg (ref 26.6–33.0)
MCHC: 33.4 g/dL (ref 31.5–35.7)
MCV: 89 fL (ref 79–97)
MONOCYTES: 7 %
Monocytes Absolute: 0.5 10*3/uL (ref 0.1–0.9)
Neutrophils Absolute: 4.6 10*3/uL (ref 1.4–7.0)
Neutrophils: 68 %
Platelets: 282 10*3/uL (ref 150–379)
RBC: 4.66 x10E6/uL (ref 3.77–5.28)
RDW: 13.9 % (ref 12.3–15.4)
WBC: 6.7 10*3/uL (ref 3.4–10.8)

## 2016-07-27 LAB — CMP14+EGFR
ALBUMIN: 4.2 g/dL (ref 3.5–4.8)
ALK PHOS: 68 IU/L (ref 39–117)
ALT: 21 IU/L (ref 0–32)
AST: 23 IU/L (ref 0–40)
Albumin/Globulin Ratio: 1.8 (ref 1.2–2.2)
BILIRUBIN TOTAL: 0.6 mg/dL (ref 0.0–1.2)
BUN / CREAT RATIO: 16 (ref 12–28)
BUN: 14 mg/dL (ref 8–27)
CHLORIDE: 102 mmol/L (ref 96–106)
CO2: 27 mmol/L (ref 18–29)
Calcium: 9.2 mg/dL (ref 8.7–10.3)
Creatinine, Ser: 0.86 mg/dL (ref 0.57–1.00)
GFR calc Af Amer: 79 mL/min/{1.73_m2} (ref >59)
GFR calc non Af Amer: 68 mL/min/{1.73_m2} (ref >59)
GLUCOSE: 89 mg/dL (ref 65–99)
Globulin, Total: 2.4 g/dL (ref 1.5–4.5)
Potassium: 4.7 mmol/L (ref 3.5–5.2)
SODIUM: 142 mmol/L (ref 134–144)
Total Protein: 6.6 g/dL (ref 6.0–8.5)

## 2016-07-27 LAB — LIPID PANEL
CHOLESTEROL TOTAL: 163 mg/dL (ref 100–199)
Chol/HDL Ratio: 3 ratio units (ref 0.0–4.4)
HDL: 55 mg/dL (ref >39)
LDL CALC: 87 mg/dL (ref 0–99)
Triglycerides: 104 mg/dL (ref 0–149)
VLDL CHOLESTEROL CAL: 21 mg/dL (ref 5–40)

## 2016-07-27 LAB — TSH: TSH: 0.841 u[IU]/mL (ref 0.450–4.500)

## 2016-07-27 NOTE — Progress Notes (Signed)
Patient aware.

## 2016-07-27 NOTE — Telephone Encounter (Signed)
Mailed copy of labs to pt

## 2016-08-02 ENCOUNTER — Other Ambulatory Visit: Payer: Self-pay | Admitting: *Deleted

## 2016-08-02 DIAGNOSIS — I10 Essential (primary) hypertension: Secondary | ICD-10-CM

## 2016-08-02 MED ORDER — LOSARTAN POTASSIUM 25 MG PO TABS: 25.0000 mg | ORAL_TABLET | Freq: Every day | ORAL | 1 refills | Status: DC

## 2016-08-02 NOTE — Progress Notes (Signed)
Pt came in for OV on 07/26/2016 with April Weber Needed med refill Okayed per Dr Wilford Sports in to [facility] per pt request

## 2016-11-05 DIAGNOSIS — H53032 Strabismic amblyopia, left eye: Secondary | ICD-10-CM | POA: Diagnosis not present

## 2016-11-05 DIAGNOSIS — H2513 Age-related nuclear cataract, bilateral: Secondary | ICD-10-CM | POA: Diagnosis not present

## 2016-11-05 DIAGNOSIS — H35033 Hypertensive retinopathy, bilateral: Secondary | ICD-10-CM | POA: Diagnosis not present

## 2016-11-05 DIAGNOSIS — I10 Essential (primary) hypertension: Secondary | ICD-10-CM | POA: Diagnosis not present

## 2016-12-07 DIAGNOSIS — I8312 Varicose veins of left lower extremity with inflammation: Secondary | ICD-10-CM | POA: Diagnosis not present

## 2016-12-07 DIAGNOSIS — I8311 Varicose veins of right lower extremity with inflammation: Secondary | ICD-10-CM | POA: Diagnosis not present

## 2016-12-07 DIAGNOSIS — I83813 Varicose veins of bilateral lower extremities with pain: Secondary | ICD-10-CM | POA: Diagnosis not present

## 2016-12-13 ENCOUNTER — Other Ambulatory Visit: Payer: Self-pay | Admitting: Family Medicine

## 2016-12-13 DIAGNOSIS — Z1231 Encounter for screening mammogram for malignant neoplasm of breast: Secondary | ICD-10-CM

## 2017-01-11 ENCOUNTER — Telehealth: Payer: Self-pay | Admitting: Family Medicine

## 2017-01-11 MED ORDER — SIMVASTATIN 10 MG PO TABS: 10.0000 mg | ORAL_TABLET | Freq: Every day | ORAL | 0 refills | Status: DC

## 2017-01-11 NOTE — Telephone Encounter (Signed)
Refill #90 sent to pharmacy Appt made for late October

## 2017-01-18 ENCOUNTER — Ambulatory Visit
Admission: RE | Admit: 2017-01-18 | Discharge: 2017-01-18 | Disposition: A | Discharge status: Home / Self Care | Payer: Medicare Other | Source: Ambulatory Visit | Attending: Family Medicine | Admitting: Family Medicine

## 2017-01-18 DIAGNOSIS — Z1231 Encounter for screening mammogram for malignant neoplasm of breast: Secondary | ICD-10-CM

## 2017-01-18 IMAGING — MG 2D DIGITAL SCREENING UNILATERAL LEFT MAMMOGRAM WITH CAD AND ADJU
6 series · 6 of 14 positions shown · non-contrast
Comparison: Previous exam(s).

CLINICAL DATA: Screening.

EXAM:
2D DIGITAL SCREENING UNILATERAL LEFT MAMMOGRAM WITH CAD AND ADJUNCT
TOMO

[L CC]
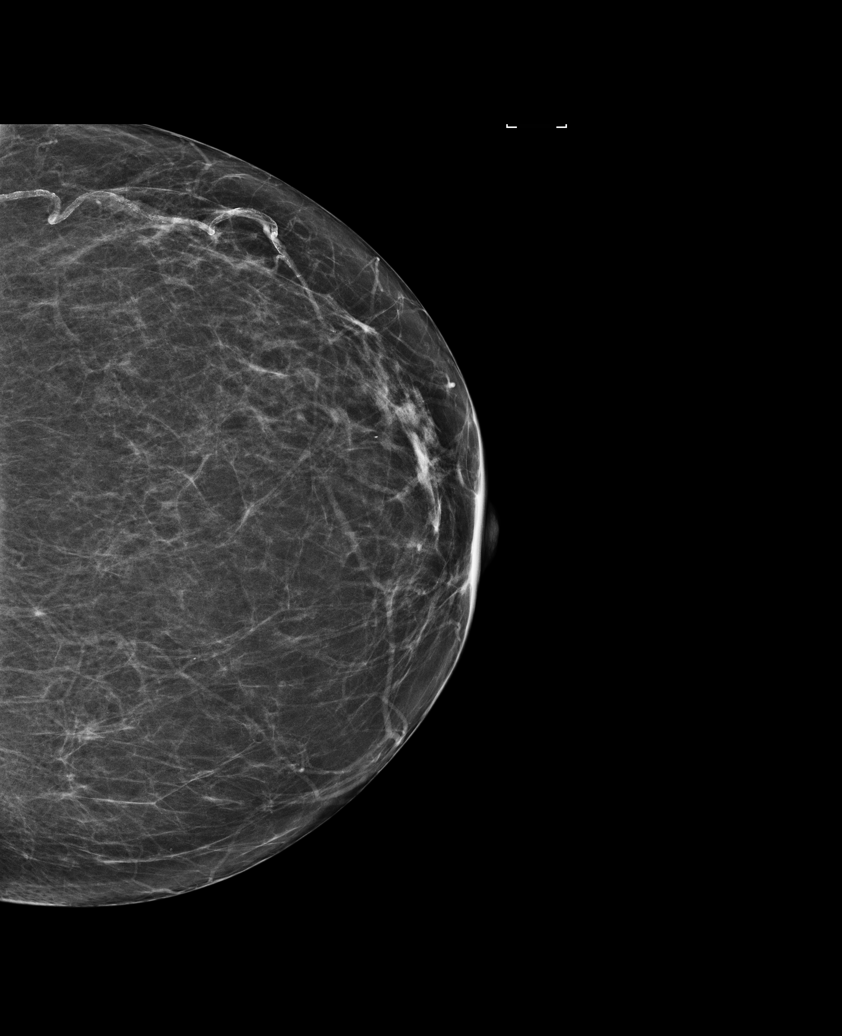

[L MLO synth-2D]
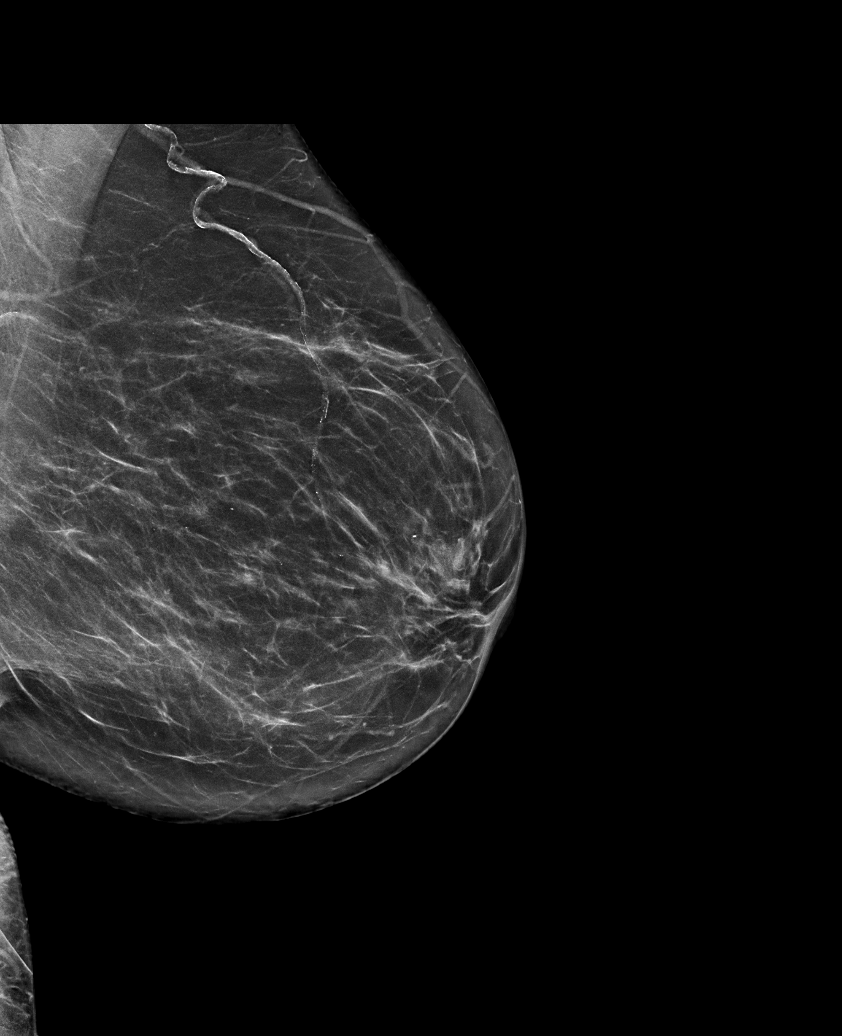

[L MLO]
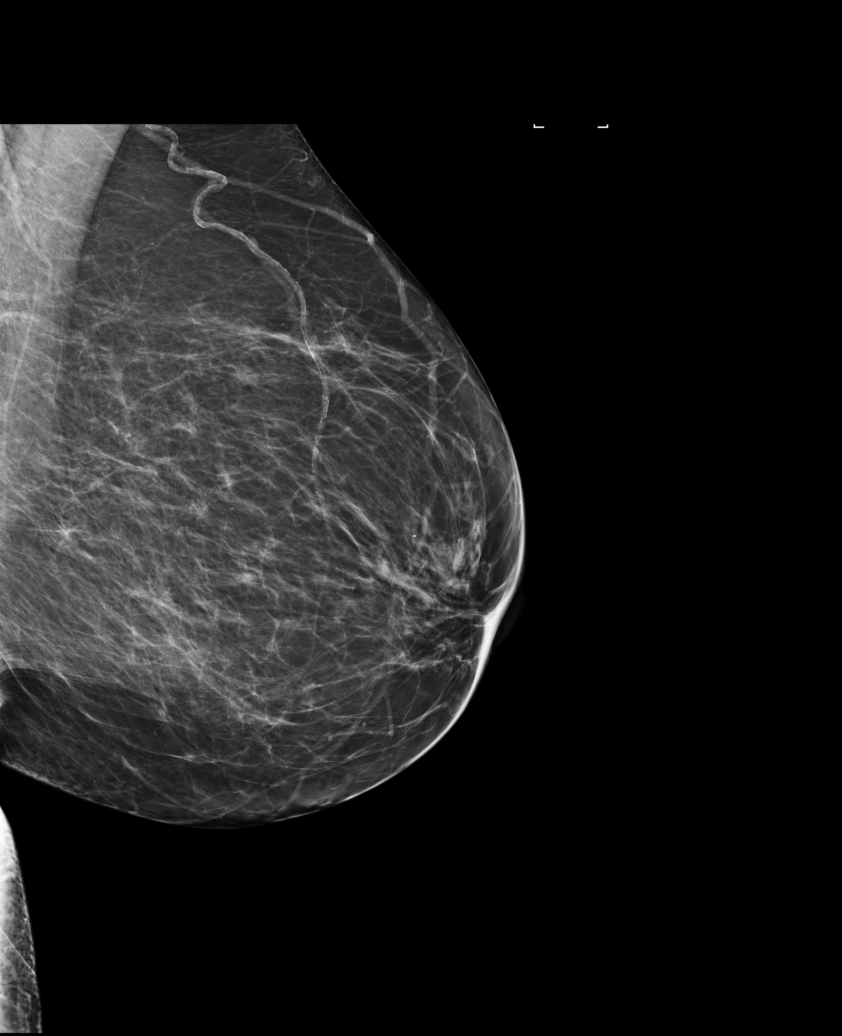

[L CC synth-2D]
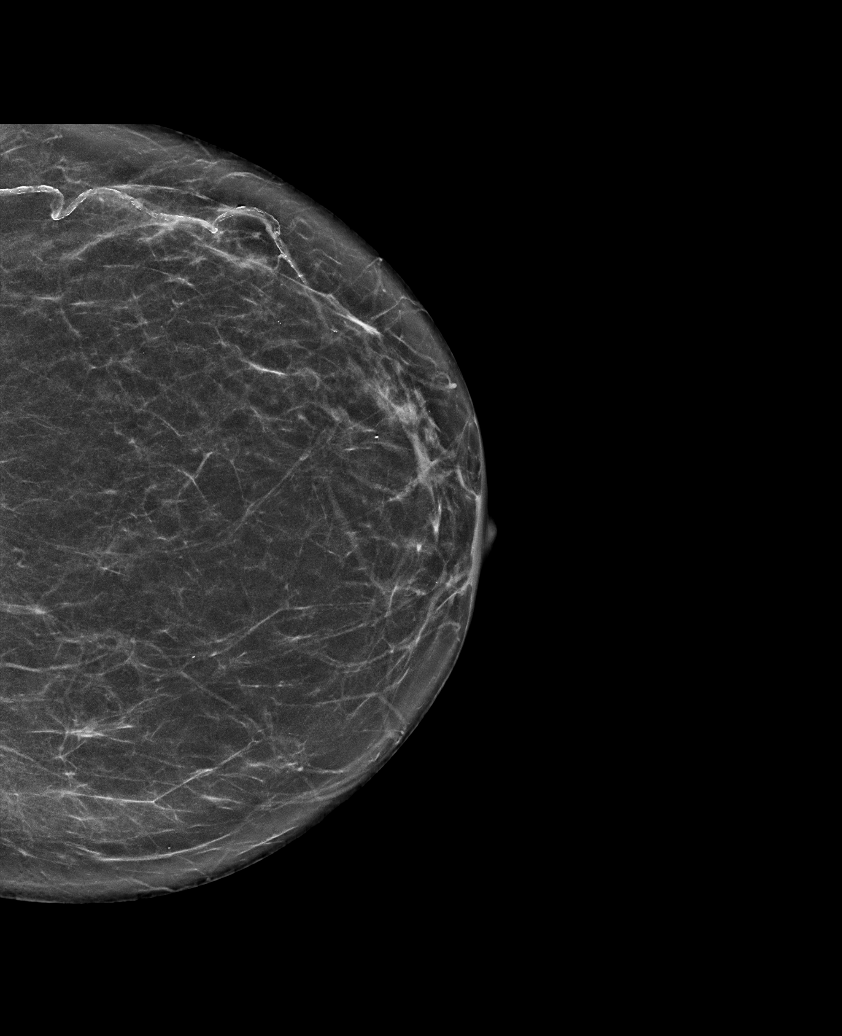

[L CC tomo · tomo slice 33/64.0]
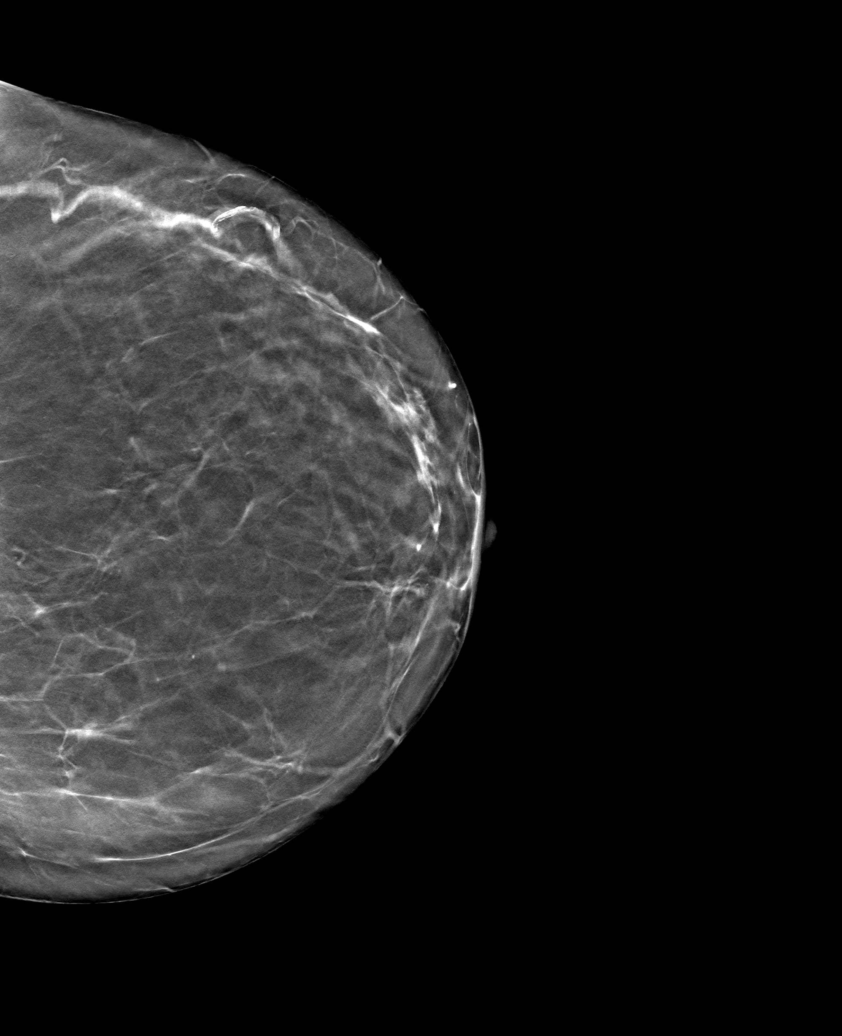

[L MLO tomo · tomo slice 37/74.0]
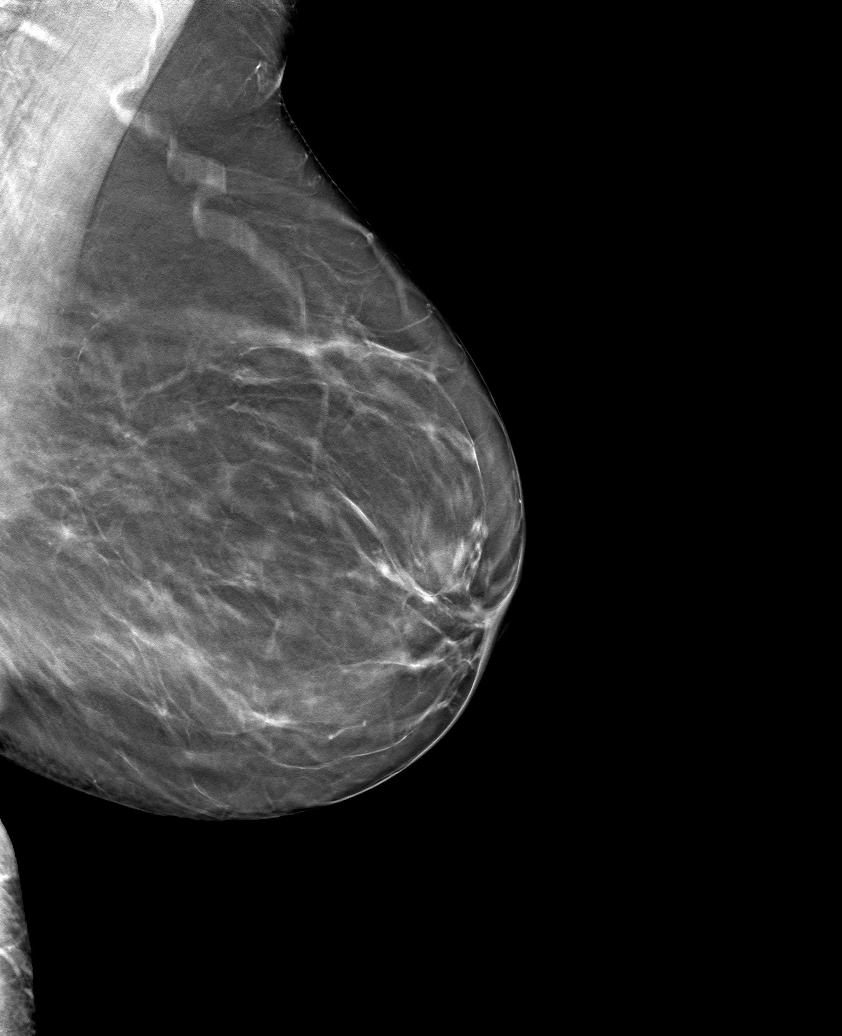

[6 of 14 positions shown; findings below may reference images not displayed]

ACR Breast Density Category b: There are scattered areas of
fibroglandular density.
FINDINGS: There are no findings suspicious for malignancy. Images were
processed with CAD.
IMPRESSION: No mammographic evidence of malignancy. A result letter of this
screening mammogram will be mailed directly to the patient.

RECOMMENDATION:
Screening mammogram in one year. (Code:[ZA])

BI-RADS CATEGORY  1: Negative.

## 2017-03-01 ENCOUNTER — Encounter: Payer: Self-pay | Admitting: Family Medicine

## 2017-03-01 ENCOUNTER — Ambulatory Visit (INDEPENDENT_AMBULATORY_CARE_PROVIDER_SITE_OTHER): Payer: Medicare Other | Admitting: Family Medicine

## 2017-03-01 VITALS — BP 157/85 | HR 64 | Temp 96.6°F | Ht 66.5 in | Wt 164.4 lb

## 2017-03-01 DIAGNOSIS — Z23 Encounter for immunization: Secondary | ICD-10-CM

## 2017-03-01 DIAGNOSIS — M25552 Pain in left hip: Secondary | ICD-10-CM | POA: Diagnosis not present

## 2017-03-01 DIAGNOSIS — E890 Postprocedural hypothyroidism: Secondary | ICD-10-CM

## 2017-03-01 DIAGNOSIS — I10 Essential (primary) hypertension: Secondary | ICD-10-CM | POA: Diagnosis not present

## 2017-03-01 DIAGNOSIS — E7800 Pure hypercholesterolemia, unspecified: Secondary | ICD-10-CM | POA: Diagnosis not present

## 2017-03-01 MED ORDER — MELOXICAM 7.5 MG PO TABS: 7.5000 mg | ORAL_TABLET | Freq: Every day | ORAL | 0 refills | Status: DC

## 2017-03-01 MED ORDER — SIMVASTATIN 10 MG PO TABS: 10.0000 mg | ORAL_TABLET | Freq: Every day | ORAL | 3 refills | Status: DC

## 2017-03-01 MED ORDER — ATENOLOL 25 MG PO TABS: 25.0000 mg | ORAL_TABLET | Freq: Every day | ORAL | 3 refills | Status: DC

## 2017-03-01 MED ORDER — LOSARTAN POTASSIUM 25 MG PO TABS: 25.0000 mg | ORAL_TABLET | Freq: Every day | ORAL | 3 refills | Status: DC

## 2017-03-01 MED ORDER — LEVOTHYROXINE SODIUM 100 MCG PO TABS: 100.0000 ug | ORAL_TABLET | Freq: Every day | ORAL | 3 refills | Status: DC

## 2017-03-01 NOTE — Progress Notes (Signed)
   HPI  Patient presents today here for follow-up of chronic medical conditions.  Hypertension Very well controlled at home ranging 120s and 30s average systolic over 80s diastolic. She has very good medication compliance and needs refills. No side effects.  We discussed her influenza vaccine-she will have high dose.  Hypercholesterolemia Good medication compliance, Watching diet.  Hip pain Patient has occasional hip pain after vigorous exercise, these tend to come and flares and last about a week.  She requests Celebrex for expects that is too expensive.    PMH: Smoking status noted ROS: Per HPI  Objective: BP (!) 157/85   Pulse 64   Temp (!) 96.6 F (35.9 C) (Oral)   Ht 5' 6.5" (1.689 m)   Wt 164 lb 6.4 oz (74.6 kg)   BMI 26.14 kg/m  Gen: NAD, alert, cooperative with exam HEENT: NCAT CV: RRR, good S1/S2, no murmur Resp: CTABL, no wheezes, non-labored Ext: No edema, warm Neuro: Alert and oriented, No gross deficits  Assessment and plan:  #Hypertension Well-controlled at home, whitecoat component No red flags refill medications, labs  #Hyperlipidemia Repeat labs, stable, continue statin   #Hypothyroidism Refill Synthroid, repeat labs  #Need for influenza vaccine- counseling provided for all vaccine components  Left hip pain Likely OA, comes in flares.  Short courses of meloxicam okay, given patient 4-day limit for a course   Orders Placed This Encounter  Procedures  . CMP14+EGFR  . Lipid panel  . TSH  . CBC with Differential/Platelet    Meds ordered this encounter  Medications  . atenolol (TENORMIN) 25 MG tablet    Sig: Take 1 tablet (25 mg total) by mouth daily.    Dispense:  90 tablet    Refill:  3  . simvastatin (ZOCOR) 10 MG tablet    Sig: Take 1 tablet (10 mg total) by mouth at bedtime.    Dispense:  90 tablet    Refill:  3  . levothyroxine (SYNTHROID, LEVOTHROID) 100 MCG tablet    Sig: Take 1 tablet (100 mcg total) by mouth daily.    Dispense:  90 tablet    Refill:  3  . losartan (COZAAR) 25 MG tablet    Sig: Take 1 tablet (25 mg total) by mouth daily.    Dispense:  90 tablet    Refill:  3  . meloxicam (MOBIC) 7.5 MG tablet    Sig: Take 1 tablet (7.5 mg total) by mouth daily.    Dispense:  20 tablet    Refill:  0    Sam Bradshaw, MD [facility] 03/01/2017, 9:02 AM     

## 2017-03-01 NOTE — Patient Instructions (Signed)
Great to see you!  You can try meloxicam as needed up to 4 days at a time. Use it carefully and have plenty of fluids with it.

## 2017-03-02 LAB — CBC WITH DIFFERENTIAL/PLATELET
Basophils Absolute: 0.1 10*3/uL (ref 0.0–0.2)
Basos: 1 %
EOS (ABSOLUTE): 0.4 10*3/uL (ref 0.0–0.4)
EOS: 5 %
HEMATOCRIT: 43.4 % (ref 34.0–46.6)
Hemoglobin: 14.6 g/dL (ref 11.1–15.9)
IMMATURE GRANULOCYTES: 0 %
Immature Grans (Abs): 0 10*3/uL (ref 0.0–0.1)
Lymphocytes Absolute: 1.4 10*3/uL (ref 0.7–3.1)
Lymphs: 17 %
MCH: 29.6 pg (ref 26.6–33.0)
MCHC: 33.6 g/dL (ref 31.5–35.7)
MCV: 88 fL (ref 79–97)
MONOCYTES: 8 %
MONOS ABS: 0.6 10*3/uL (ref 0.1–0.9)
NEUTROS PCT: 69 %
Neutrophils Absolute: 5.7 10*3/uL (ref 1.4–7.0)
Platelets: 292 10*3/uL (ref 150–379)
RBC: 4.94 x10E6/uL (ref 3.77–5.28)
RDW: 13.3 % (ref 12.3–15.4)
WBC: 8.2 10*3/uL (ref 3.4–10.8)

## 2017-03-02 LAB — TSH: TSH: 1.18 u[IU]/mL (ref 0.450–4.500)

## 2017-03-02 LAB — CMP14+EGFR
A/G RATIO: 2 (ref 1.2–2.2)
ALBUMIN: 4.3 g/dL (ref 3.5–4.8)
ALK PHOS: 86 IU/L (ref 39–117)
ALT: 14 IU/L (ref 0–32)
AST: 17 IU/L (ref 0–40)
BILIRUBIN TOTAL: 0.6 mg/dL (ref 0.0–1.2)
BUN / CREAT RATIO: 19 (ref 12–28)
BUN: 18 mg/dL (ref 8–27)
CHLORIDE: 102 mmol/L (ref 96–106)
CO2: 26 mmol/L (ref 20–29)
Calcium: 9.7 mg/dL (ref 8.7–10.3)
Creatinine, Ser: 0.95 mg/dL (ref 0.57–1.00)
GFR calc Af Amer: 69 mL/min/{1.73_m2} (ref >59)
GFR calc non Af Amer: 60 mL/min/{1.73_m2} (ref >59)
GLOBULIN, TOTAL: 2.2 g/dL (ref 1.5–4.5)
Glucose: 89 mg/dL (ref 65–99)
Potassium: 4.6 mmol/L (ref 3.5–5.2)
SODIUM: 142 mmol/L (ref 134–144)
Total Protein: 6.5 g/dL (ref 6.0–8.5)

## 2017-03-02 LAB — LIPID PANEL
CHOLESTEROL TOTAL: 167 mg/dL (ref 100–199)
Chol/HDL Ratio: 3.2 ratio (ref 0.0–4.4)
HDL: 52 mg/dL (ref >39)
LDL CALC: 89 mg/dL (ref 0–99)
Triglycerides: 128 mg/dL (ref 0–149)
VLDL Cholesterol Cal: 26 mg/dL (ref 5–40)

## 2017-03-03 ENCOUNTER — Encounter: Payer: Self-pay | Admitting: Family Medicine

## 2017-03-08 ENCOUNTER — Telehealth: Payer: Self-pay | Admitting: Family Medicine

## 2017-03-08 NOTE — Telephone Encounter (Signed)
mailed

## 2017-07-08 ENCOUNTER — Telehealth: Payer: Self-pay | Admitting: Family Medicine

## 2017-07-08 NOTE — Telephone Encounter (Signed)
Aware.  Check with pharmacy for recalls on specifics.

## 2017-11-19 ENCOUNTER — Other Ambulatory Visit: Payer: Self-pay | Admitting: Family Medicine

## 2017-11-19 NOTE — Telephone Encounter (Signed)
What is the name of the medication? meloxicam (MOBIC) 7.5 MG tablet. Pt asking for 15mg   Have you contacted your pharmacy to request a refill? no  Which pharmacy would you like this sent to? [facility]   Patient notified that their request is being sent to the clinical staff for review and that they should receive a call once it is complete. If they do not receive a call within 24 hours they can check with their pharmacy or our office.

## 2017-11-21 ENCOUNTER — Telehealth: Payer: Self-pay | Admitting: *Deleted

## 2017-11-21 MED ORDER — MELOXICAM 15 MG PO TABS: 15.0000 mg | ORAL_TABLET | Freq: Every day | ORAL | 0 refills | Status: DC

## 2017-11-21 MED ORDER — MELOXICAM 7.5 MG PO TABS: 7.5000 mg | ORAL_TABLET | Freq: Every day | ORAL | 0 refills | Status: DC

## 2017-11-21 NOTE — Telephone Encounter (Signed)
Meloxicam prescribed for her age group is only for short-term intermittent use.  Medication is not without risk.  Refill number 2015 mg meloxicam.  Laroy Apple, MD [facility] 11/21/2017, 4:17 PM

## 2017-11-21 NOTE — Telephone Encounter (Signed)
Pt came in Saturday requesting refill of Meloxicam she has had 7.5 mg in the past would like an Rx for 15 mg to [facility]

## 2017-11-21 NOTE — Telephone Encounter (Signed)
Patient aware.

## 2017-12-06 ENCOUNTER — Ambulatory Visit (INDEPENDENT_AMBULATORY_CARE_PROVIDER_SITE_OTHER): Payer: Medicare Other | Admitting: Podiatry

## 2017-12-06 ENCOUNTER — Encounter: Payer: Self-pay | Admitting: Podiatry

## 2017-12-06 ENCOUNTER — Ambulatory Visit (INDEPENDENT_AMBULATORY_CARE_PROVIDER_SITE_OTHER): Payer: Medicare Other

## 2017-12-06 VITALS — BP 146/85 | HR 72 | Resp 16

## 2017-12-06 DIAGNOSIS — M1990 Unspecified osteoarthritis, unspecified site: Secondary | ICD-10-CM | POA: Diagnosis not present

## 2017-12-06 DIAGNOSIS — M722 Plantar fascial fibromatosis: Secondary | ICD-10-CM | POA: Diagnosis not present

## 2017-12-06 NOTE — Progress Notes (Signed)
Subjective:  Patient ID: April Weber, female    DOB: [birth year],  MRN: [id number] HPI Chief Complaint  Patient presents with  . Foot Pain    Medial foot and dorsal midfoot right - aching, burning x few months, wears orthotics still but they are 72 years old  . New Patient (Initial Visit)    Est pt - 07/2014    72 y.o. female presents with the above complaint.   ROS: Denies fever chills nausea vomiting muscle aches pains calf pain back pain chest pain shortness of breath.  Past Medical History:  Diagnosis Date  . Allergy    seasonal  . Chronic kidney disease    has one kidney  . Hyperlipidemia   . Hypertension   . Osteopenia   . Thyroid disease    Past Surgical History:  Procedure Laterality Date  . ABDOMINAL HYSTERECTOMY    . APPENDECTOMY    . BREAST BIOPSY    . EYE SURGERY     72 years old  . MASTECTOMY Right 1984  . ROTATOR CUFF REPAIR    . THYROID SURGERY      Current Outpatient Medications:  .  aspirin 81 MG EC tablet, Take 81 mg by mouth daily.  , Disp: , Rfl:  .  atenolol (TENORMIN) 25 MG tablet, Take 1 tablet (25 mg total) by mouth daily., Disp: 90 tablet, Rfl: 3 .  Calcium 600-200 MG-UNIT per tablet, Take 1 tablet by mouth 2 (two) times daily. , Disp: , Rfl:  .  fish oil-omega-3 fatty acids 1000 MG capsule, Take 1 g by mouth daily. Takes 1 tab 1400mg , Disp: , Rfl:  .  levothyroxine (SYNTHROID, LEVOTHROID) 100 MCG tablet, Take 1 tablet (100 mcg total) by mouth daily., Disp: 90 tablet, Rfl: 3 .  losartan (COZAAR) 25 MG tablet, Take 1 tablet (25 mg total) by mouth daily., Disp: 90 tablet, Rfl: 3 .  meloxicam (MOBIC) 15 MG tablet, Take 1 tablet (15 mg total) by mouth daily., Disp: 20 tablet, Rfl: 0 .  Multiple Vitamin (MULTIVITAMIN) capsule, Take 1 capsule by mouth daily.  , Disp: , Rfl:  .  simvastatin (ZOCOR) 10 MG tablet, Take 1 tablet (10 mg total) by mouth at bedtime., Disp: 90 tablet, Rfl: 3  Allergies  Allergen Reactions  . Codeine     REACTION: makes  sick  . Doxycycline     REACTION: whelps hives  . Iodine     REACTION: breakout  . Penicillins     REACTION: whelps hives   Review of Systems Objective:   Vitals:   12/06/17 1113  BP: (!) 146/85  Pulse: 72  Resp: 16    General: Well developed, nourished, in no acute distress, alert and oriented x3   Dermatological: Skin is warm, dry and supple bilateral. Nails x 10 are well maintained; remaining integument appears unremarkable at this time. There are no open sores, no preulcerative lesions, no rash or signs of infection present.  Vascular: Dorsalis Pedis artery and Posterior Tibial artery pedal pulses are 2/4 bilateral with immedate capillary fill time. Pedal hair growth present. No varicosities and no lower extremity edema present bilateral.   Neruologic: Grossly intact via light touch bilateral. Vibratory intact via tuning fork bilateral. Protective threshold with Semmes Wienstein monofilament intact to all pedal sites bilateral. Patellar and Achilles deep tendon reflexes 2+ bilateral. No Babinski or clonus noted bilateral.   Musculoskeletal: No gross boney pedal deformities bilateral. No pain, crepitus, or limitation noted with foot and ankle  range of motion bilateral. Muscular strength 5/5 in all groups tested bilateral.  She has no reproducible pain on palpation or range of motion of the medial aspect of the foot navicular cuneiform joint.  Gait: Unassisted, Nonantalgic.    Radiographs:  Radiographs taken today demonstrate an osseously mature individual with osteoarthritic changes along the dorsal midfoot and the medial aspect of the midfoot.  Osteoarthritis midfoot  Assessment & Plan:   Assessment: Right.  Plan: Discussed etiology pathology conservative versus surgical therapies.  Offered her injection she declined we discussed appropriate shoe gear continuation of the use of her orthotics I will follow-up with her on an as-needed basis.     Timmie Dugue [place], [place]

## 2017-12-22 DIAGNOSIS — I83813 Varicose veins of bilateral lower extremities with pain: Secondary | ICD-10-CM | POA: Diagnosis not present

## 2017-12-22 DIAGNOSIS — I8311 Varicose veins of right lower extremity with inflammation: Secondary | ICD-10-CM | POA: Diagnosis not present

## 2017-12-22 DIAGNOSIS — I8312 Varicose veins of left lower extremity with inflammation: Secondary | ICD-10-CM | POA: Diagnosis not present

## 2018-01-17 ENCOUNTER — Other Ambulatory Visit: Payer: Self-pay | Admitting: Family Medicine

## 2018-01-17 DIAGNOSIS — Z1231 Encounter for screening mammogram for malignant neoplasm of breast: Secondary | ICD-10-CM

## 2018-01-26 ENCOUNTER — Other Ambulatory Visit: Payer: Self-pay | Admitting: Family Medicine

## 2018-01-26 ENCOUNTER — Telehealth: Payer: Self-pay | Admitting: Family Medicine

## 2018-01-26 DIAGNOSIS — Z130 Encounter for screening for diseases of the blood and blood-forming organs and certain disorders involving the immune mechanism: Secondary | ICD-10-CM

## 2018-01-26 DIAGNOSIS — Z1159 Encounter for screening for other viral diseases: Secondary | ICD-10-CM

## 2018-01-26 DIAGNOSIS — I10 Essential (primary) hypertension: Secondary | ICD-10-CM

## 2018-01-26 DIAGNOSIS — Z853 Personal history of malignant neoplasm of breast: Secondary | ICD-10-CM

## 2018-01-26 DIAGNOSIS — E7800 Pure hypercholesterolemia, unspecified: Secondary | ICD-10-CM

## 2018-01-26 DIAGNOSIS — E890 Postprocedural hypothyroidism: Secondary | ICD-10-CM

## 2018-01-26 NOTE — Telephone Encounter (Signed)
Labs are placed.  Please remind patient to come in fasting.

## 2018-01-27 NOTE — Telephone Encounter (Signed)
Pt aware.

## 2018-02-06 ENCOUNTER — Telehealth: Payer: Self-pay | Admitting: Family Medicine

## 2018-02-07 ENCOUNTER — Other Ambulatory Visit: Payer: Self-pay | Admitting: Family Medicine

## 2018-02-07 DIAGNOSIS — Z780 Asymptomatic menopausal state: Secondary | ICD-10-CM

## 2018-02-07 NOTE — Telephone Encounter (Signed)
Pt scheduled for 8:30 on day of OV with Dr. Lajuana Ripple. Pt aware of appointment date/time

## 2018-02-13 ENCOUNTER — Other Ambulatory Visit: Payer: Medicare Other

## 2018-02-13 DIAGNOSIS — E890 Postprocedural hypothyroidism: Secondary | ICD-10-CM | POA: Diagnosis not present

## 2018-02-13 DIAGNOSIS — M79641 Pain in right hand: Secondary | ICD-10-CM | POA: Diagnosis not present

## 2018-02-13 DIAGNOSIS — E7800 Pure hypercholesterolemia, unspecified: Secondary | ICD-10-CM

## 2018-02-13 DIAGNOSIS — I10 Essential (primary) hypertension: Secondary | ICD-10-CM

## 2018-02-13 DIAGNOSIS — Z1159 Encounter for screening for other viral diseases: Secondary | ICD-10-CM

## 2018-02-13 DIAGNOSIS — Z853 Personal history of malignant neoplasm of breast: Secondary | ICD-10-CM | POA: Diagnosis not present

## 2018-02-13 DIAGNOSIS — Z130 Encounter for screening for diseases of the blood and blood-forming organs and certain disorders involving the immune mechanism: Secondary | ICD-10-CM | POA: Diagnosis not present

## 2018-02-13 DIAGNOSIS — Z780 Asymptomatic menopausal state: Secondary | ICD-10-CM

## 2018-02-13 DIAGNOSIS — M720 Palmar fascial fibromatosis [Dupuytren]: Secondary | ICD-10-CM | POA: Diagnosis not present

## 2018-02-13 DIAGNOSIS — M79643 Pain in unspecified hand: Secondary | ICD-10-CM | POA: Insufficient documentation

## 2018-02-14 LAB — THYROID PANEL WITH TSH
FREE THYROXINE INDEX: 3 (ref 1.2–4.9)
T3 Uptake Ratio: 29 % (ref 24–39)
T4 TOTAL: 10.3 ug/dL (ref 4.5–12.0)
TSH: 1.04 u[IU]/mL (ref 0.450–4.500)

## 2018-02-14 LAB — CMP14+EGFR
ALT: 19 IU/L (ref 0–32)
AST: 21 IU/L (ref 0–40)
Albumin/Globulin Ratio: 1.8 (ref 1.2–2.2)
Albumin: 4.1 g/dL (ref 3.5–4.8)
Alkaline Phosphatase: 71 IU/L (ref 39–117)
BUN/Creatinine Ratio: 15 (ref 12–28)
BUN: 16 mg/dL (ref 8–27)
Bilirubin Total: 0.6 mg/dL (ref 0.0–1.2)
CALCIUM: 9.8 mg/dL (ref 8.7–10.3)
CO2: 26 mmol/L (ref 20–29)
CREATININE: 1.06 mg/dL — AB (ref 0.57–1.00)
Chloride: 102 mmol/L (ref 96–106)
GFR calc Af Amer: 60 mL/min/{1.73_m2} (ref >59)
GFR calc non Af Amer: 52 mL/min/{1.73_m2} — ABNORMAL LOW (ref >59)
GLOBULIN, TOTAL: 2.3 g/dL (ref 1.5–4.5)
Glucose: 91 mg/dL (ref 65–99)
Potassium: 4.6 mmol/L (ref 3.5–5.2)
SODIUM: 142 mmol/L (ref 134–144)
Total Protein: 6.4 g/dL (ref 6.0–8.5)

## 2018-02-14 LAB — CBC WITH DIFFERENTIAL/PLATELET
Basophils Absolute: 0.1 10*3/uL (ref 0.0–0.2)
Basos: 1 %
EOS (ABSOLUTE): 0.4 10*3/uL (ref 0.0–0.4)
EOS: 8 %
HEMATOCRIT: 41.8 % (ref 34.0–46.6)
HEMOGLOBIN: 14.1 g/dL (ref 11.1–15.9)
IMMATURE GRANULOCYTES: 0 %
Immature Grans (Abs): 0 10*3/uL (ref 0.0–0.1)
Lymphocytes Absolute: 1 10*3/uL (ref 0.7–3.1)
Lymphs: 17 %
MCH: 29.1 pg (ref 26.6–33.0)
MCHC: 33.7 g/dL (ref 31.5–35.7)
MCV: 86 fL (ref 79–97)
Monocytes Absolute: 0.4 10*3/uL (ref 0.1–0.9)
Monocytes: 7 %
NEUTROS PCT: 67 %
Neutrophils Absolute: 3.9 10*3/uL (ref 1.4–7.0)
Platelets: 275 10*3/uL (ref 150–450)
RBC: 4.85 x10E6/uL (ref 3.77–5.28)
RDW: 13 % (ref 12.3–15.4)
WBC: 5.9 10*3/uL (ref 3.4–10.8)

## 2018-02-14 LAB — LIPID PANEL
CHOLESTEROL TOTAL: 161 mg/dL (ref 100–199)
Chol/HDL Ratio: 3.5 ratio (ref 0.0–4.4)
HDL: 46 mg/dL (ref >39)
LDL Calculated: 89 mg/dL (ref 0–99)
Triglycerides: 128 mg/dL (ref 0–149)
VLDL Cholesterol Cal: 26 mg/dL (ref 5–40)

## 2018-02-14 LAB — HEPATITIS C ANTIBODY

## 2018-02-15 ENCOUNTER — Ambulatory Visit
Admission: RE | Admit: 2018-02-15 | Discharge: 2018-02-15 | Disposition: A | Discharge status: Home / Self Care | Payer: Medicare Other | Source: Ambulatory Visit | Attending: Nurse Practitioner | Admitting: Nurse Practitioner

## 2018-02-15 DIAGNOSIS — Z1231 Encounter for screening mammogram for malignant neoplasm of breast: Secondary | ICD-10-CM | POA: Diagnosis not present

## 2018-02-15 IMAGING — MG DIGITAL SCREENING UNILATERAL LEFT MAMMOGRAM WITH CAD AND TOMO
4 series · 4 of 12 positions shown · non-contrast
Comparison: Previous exam(s).

CLINICAL DATA: Screening.

EXAM:
DIGITAL SCREENING UNILATERAL LEFT MAMMOGRAM WITH CAD AND TOMO

[L CC synth-2D]
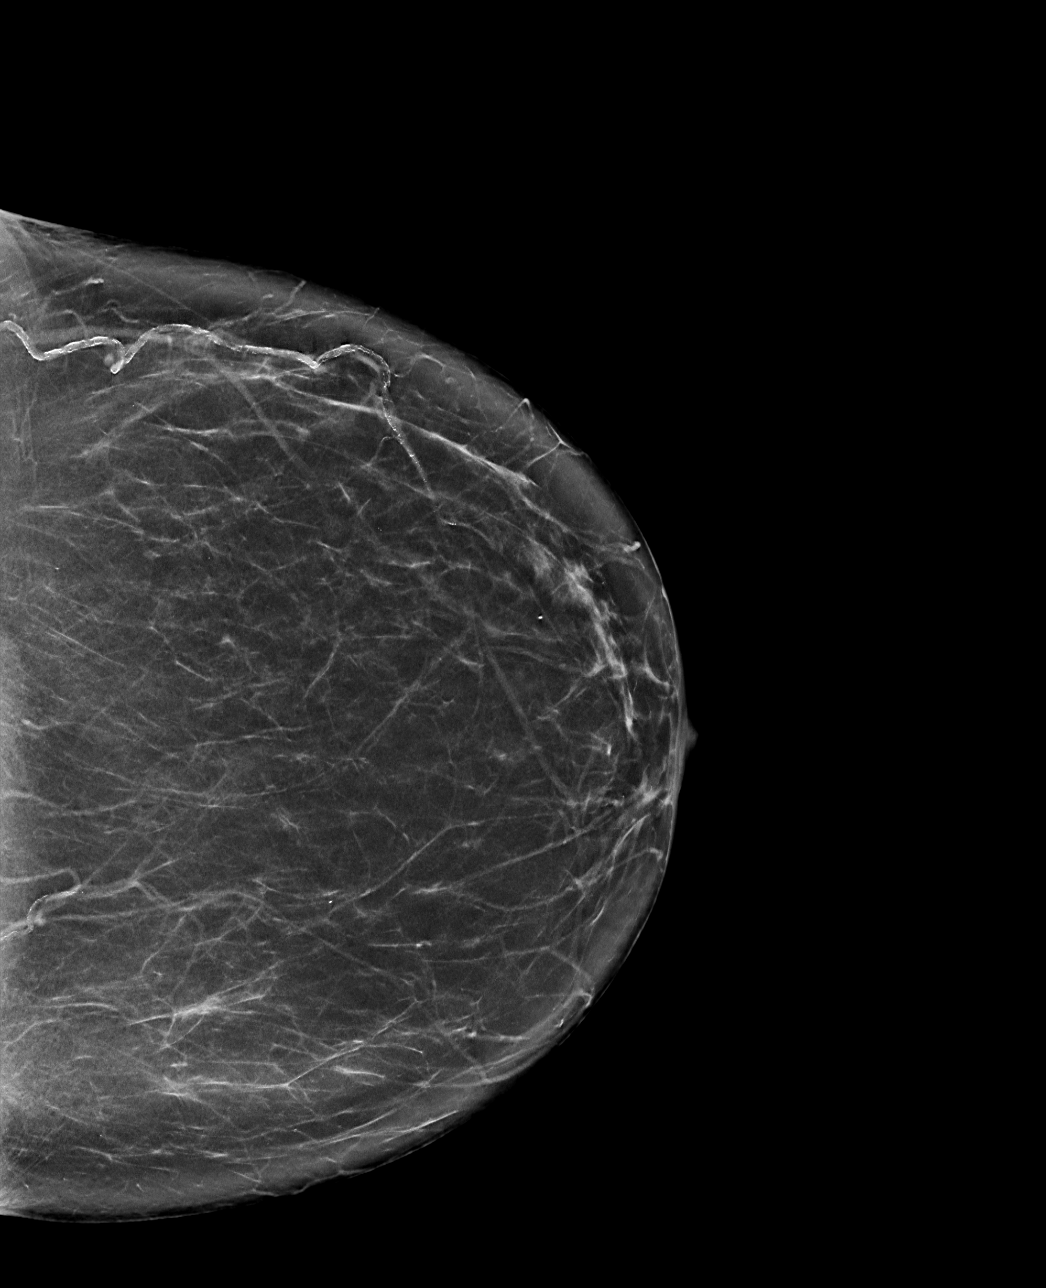

[L MLO synth-2D]
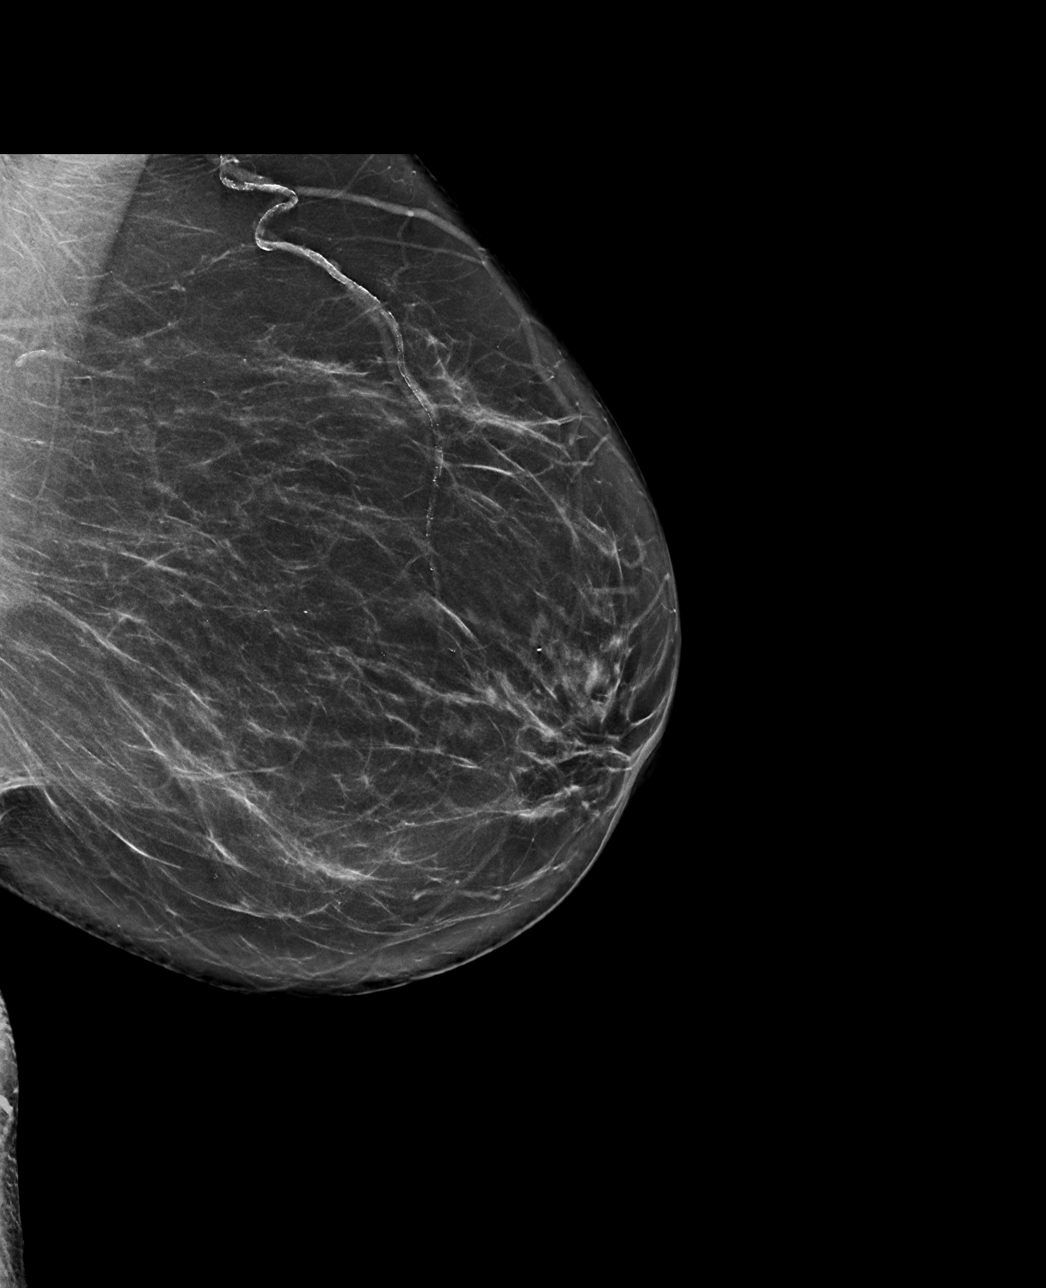

[L MLO tomo · tomo slice 41/81.0]
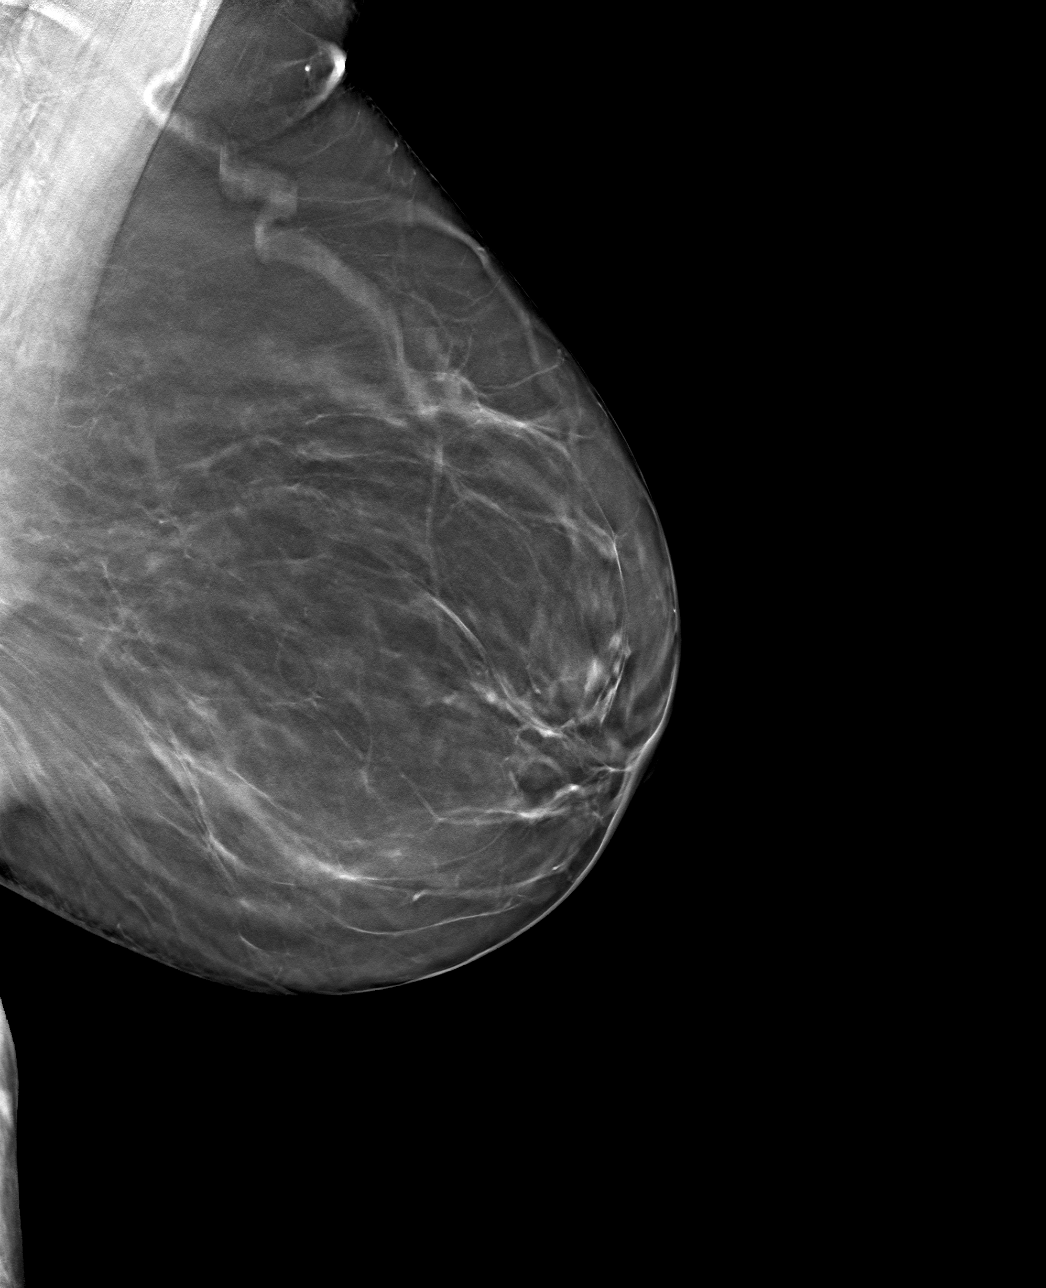

[L CC tomo · tomo slice 37/73.0]
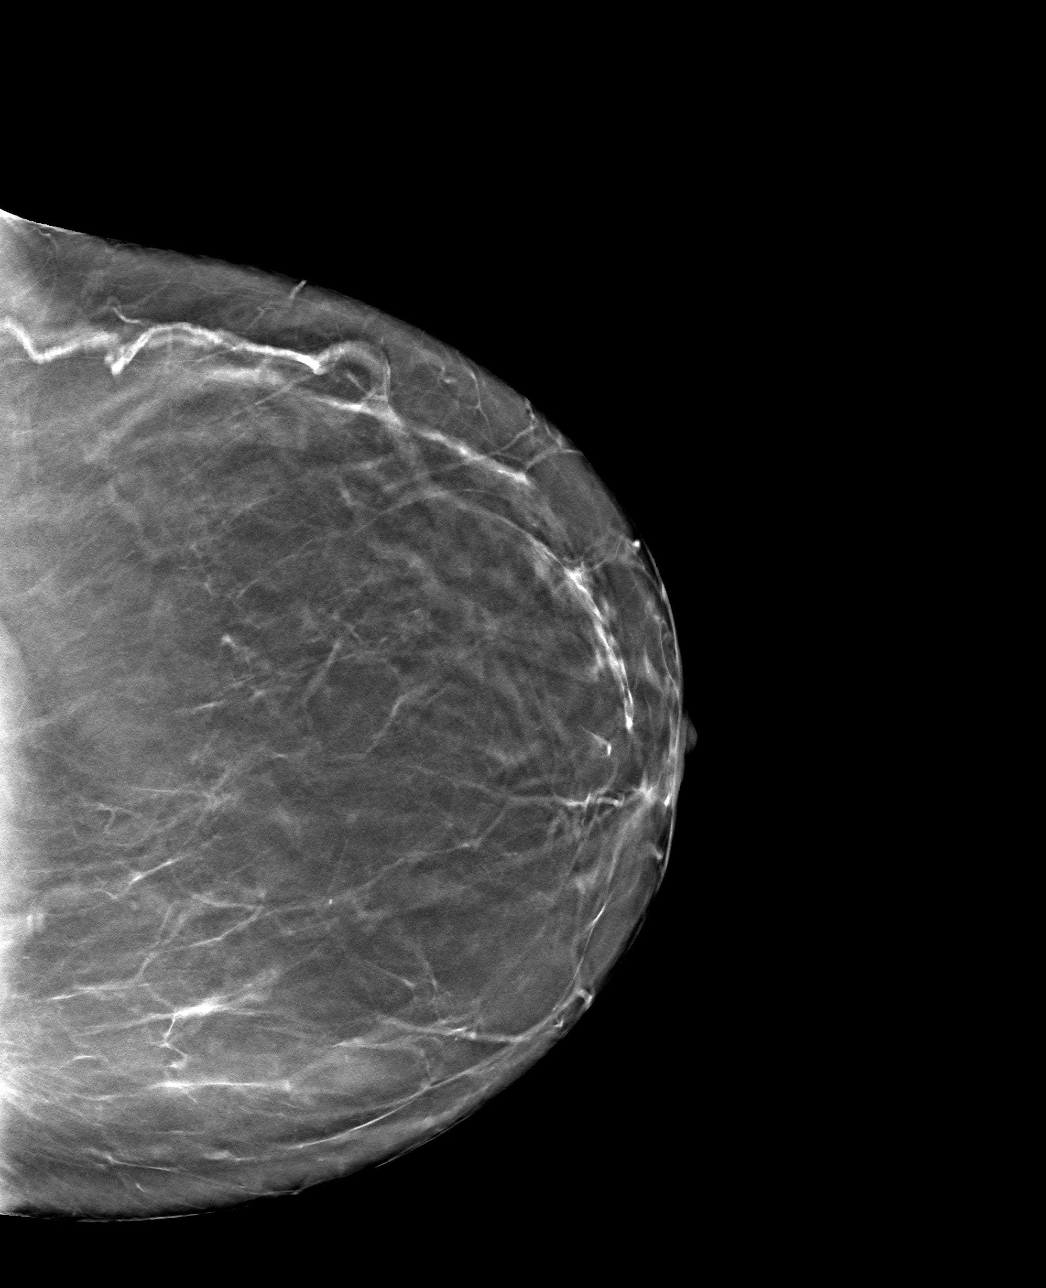

[4 of 12 positions shown; findings below may reference images not displayed]

ACR Breast Density Category b: There are scattered areas of
fibroglandular density.
FINDINGS: The patient has had a right mastectomy. There are no findings
suspicious for malignancy.

Images were processed with CAD.
IMPRESSION: No mammographic evidence of malignancy. A result letter of this
screening mammogram will be mailed directly to the patient.

RECOMMENDATION:
Screening mammogram in one year.  (Code:[S9])

BI-RADS CATEGORY  1: Negative.

## 2018-02-17 ENCOUNTER — Encounter: Payer: Self-pay | Admitting: Family Medicine

## 2018-02-17 ENCOUNTER — Ambulatory Visit (INDEPENDENT_AMBULATORY_CARE_PROVIDER_SITE_OTHER): Payer: Medicare Other

## 2018-02-17 ENCOUNTER — Ambulatory Visit (INDEPENDENT_AMBULATORY_CARE_PROVIDER_SITE_OTHER): Payer: Medicare Other | Admitting: Family Medicine

## 2018-02-17 VITALS — BP 190/92 | HR 66 | Temp 96.8°F | Ht 66.0 in | Wt 169.0 lb

## 2018-02-17 DIAGNOSIS — E890 Postprocedural hypothyroidism: Secondary | ICD-10-CM

## 2018-02-17 DIAGNOSIS — E7800 Pure hypercholesterolemia, unspecified: Secondary | ICD-10-CM | POA: Diagnosis not present

## 2018-02-17 DIAGNOSIS — M1990 Unspecified osteoarthritis, unspecified site: Secondary | ICD-10-CM | POA: Diagnosis not present

## 2018-02-17 DIAGNOSIS — M8580 Other specified disorders of bone density and structure, unspecified site: Secondary | ICD-10-CM | POA: Diagnosis not present

## 2018-02-17 DIAGNOSIS — Z780 Asymptomatic menopausal state: Secondary | ICD-10-CM

## 2018-02-17 DIAGNOSIS — Z23 Encounter for immunization: Secondary | ICD-10-CM | POA: Diagnosis not present

## 2018-02-17 DIAGNOSIS — I10 Essential (primary) hypertension: Secondary | ICD-10-CM | POA: Diagnosis not present

## 2018-02-17 MED ORDER — LOSARTAN POTASSIUM 50 MG PO TABS: 50.0000 mg | ORAL_TABLET | Freq: Every day | ORAL | 3 refills | Status: DC

## 2018-02-17 MED ORDER — MELOXICAM 15 MG PO TABS: 15.0000 mg | ORAL_TABLET | Freq: Every day | ORAL | 0 refills | Status: DC

## 2018-02-17 MED ORDER — LEVOTHYROXINE SODIUM 100 MCG PO TABS: 100.0000 ug | ORAL_TABLET | Freq: Every day | ORAL | 3 refills | Status: DC

## 2018-02-17 MED ORDER — SIMVASTATIN 10 MG PO TABS: 10.0000 mg | ORAL_TABLET | Freq: Every day | ORAL | 3 refills | Status: DC

## 2018-02-17 MED ORDER — ATENOLOL 25 MG PO TABS: 25.0000 mg | ORAL_TABLET | Freq: Every day | ORAL | 3 refills | Status: DC

## 2018-02-17 NOTE — Patient Instructions (Signed)
Come in 1 week for recheck kidney function with lab. Come in 2 weeks for BP check w/ RN.

## 2018-02-17 NOTE — Progress Notes (Signed)
Subjective: CC: HTN, Hypothyroidism, HLD PCP: [name], DO NIO:[id number] A Croak is a 72 y.o. female presenting to clinic today for:  1. Hypertension w/ Hyperlipidemia Patient reports compliance with her atenolol.  She notes that she takes half a tablet twice a day.  She also takes Cozaar 25 mg daily for blood pressure.  She reports that blood pressures have been running 140s/90s at home and this worries her.  She has one remaining kidney and wants to preserve that kidney function.  She is also on simvastatin 10 mg daily for cholesterol.  She reports good fiber intake and tries to stay active.  Denies any chest pain, shortness of breath, headaches.   2.  Hypothyroidism Patient reports long-standing history of hypothyroidism.  She had surgical resection of the thyroid many years ago and has been on Synthroid since that time.  Denies any abnormal stools, tremor, insomnia or change in weight.  No change in voice or difficulty swallowing.  3.  Arthritis Patient reports history of arthritis in the low back, bilateral hips, foot.  She takes meloxicam about twice per week for this.  She reports that symptoms are refractory to Tylenol.  She is currently followed by an orthopedist, who notes that she had a Dupuytren's contracture in the right hand.  They are monitoring this.  4.  Osteopenia Patient has DEXA scan scheduled today.   ROS: Per HPI  Allergies  Allergen Reactions  . Codeine     REACTION: makes sick  . Doxycycline     REACTION: whelps hives  . Iodine     REACTION: breakout  . Penicillins     REACTION: whelps hives   Past Medical History:  Diagnosis Date  . Allergy    seasonal  . Chronic kidney disease    has one kidney  . Hyperlipidemia   . Hypertension   . Osteopenia   . Thyroid disease     Current Outpatient Medications:  .  aspirin 81 MG EC tablet, Take 81 mg by mouth daily.  , Disp: , Rfl:  .  atenolol (TENORMIN) 25 MG tablet, Take 1 tablet (25 mg  total) by mouth daily., Disp: 90 tablet, Rfl: 3 .  Calcium 600-200 MG-UNIT per tablet, Take 1 tablet by mouth 2 (two) times daily. , Disp: , Rfl:  .  fish oil-omega-3 fatty acids 1000 MG capsule, Take 1 g by mouth daily. Takes 1 tab 1400mg , Disp: , Rfl:  .  levothyroxine (SYNTHROID, LEVOTHROID) 100 MCG tablet, Take 1 tablet (100 mcg total) by mouth daily., Disp: 90 tablet, Rfl: 3 .  losartan (COZAAR) 25 MG tablet, Take 1 tablet (25 mg total) by mouth daily., Disp: 90 tablet, Rfl: 3 .  meloxicam (MOBIC) 15 MG tablet, Take 1 tablet (15 mg total) by mouth daily., Disp: 20 tablet, Rfl: 0 .  Multiple Vitamin (MULTIVITAMIN) capsule, Take 1 capsule by mouth daily.  , Disp: , Rfl:  .  simvastatin (ZOCOR) 10 MG tablet, Take 1 tablet (10 mg total) by mouth at bedtime., Disp: 90 tablet, Rfl: 3 Social History   Socioeconomic History  . Marital status: Single    Spouse name: Divorced   . Number of children: 1  . Years of education: Not on file  . Highest education level: Not on file  Occupational History  . Occupation: Retired    Comment: worked at [employer] in [place]  . Financial resource strain: Not on file  . Food insecurity:  Worry: Not on file    Inability: Not on file  . Transportation needs:    Medical: Not on file    Non-medical: Not on file  Tobacco Use  . Smoking status: Former Smoker    Packs/day: 0.25    Types: Cigarettes    Last attempt to quit: 05/11/1967    Years since quitting: 50.8  . Smokeless tobacco: Never Used  Substance and Sexual Activity  . Alcohol use: No  . Drug use: No  . Sexual activity: Never  Lifestyle  . Physical activity:    Days per week: Not on file    Minutes per session: Not on file  . Stress: Not on file  Relationships  . Social connections:    Talks on phone: Not on file    Gets together: Not on file    Attends religious service: Not on file    Active member of club or organization: Not on file     Attends meetings of clubs or organizations: Not on file    Relationship status: Not on file  . Intimate partner violence:    Fear of current or ex partner: Not on file    Emotionally abused: Not on file    Physically abused: Not on file    Forced sexual activity: Not on file  Other Topics Concern  . Not on file  Social History Narrative   Lives alone   Family History  Problem Relation Age of Onset  . Heart attack Mother   . Alcohol abuse Mother   . Breast cancer Maternal Aunt     Objective: Office vital signs reviewed. BP (!) 190/92   Pulse 66   Temp (!) 96.8 F (36 C) (Oral)   Ht 5\' 6"  (1.676 m)   Wt 169 lb (76.7 kg)   BMI 27.28 kg/m   Physical Examination:  General: Awake, alert, well nourished, No acute distress HEENT: Normal    Neck: No masses palpated. No lymphadenopathy; no goiter    Ears: Tympanic membranes intact, normal light reflex, no erythema, no bulging    Eyes: extraocular membranes intact, sclera white; no exophthalmos Cardio: regular rate and rhythm, S1S2 heard, no murmurs appreciated Pulm: clear to auscultation bilaterally, no wheezes, rhonchi or rales; normal work of breathing on room air Extremities: warm, well perfused, No edema, cyanosis or clubbing MSK: normal gait. No visible joint discoloration or swelling.  Assessment/ Plan: 72 y.o. female   1. Essential hypertension Blood pressure not controlled.  Losartan increased to 50 mg daily.  She will return in 1 week for recheck BMP in 2 weeks to recheck blood pressure with the nurse.  She will continue to monitor blood pressures at home.  Goal less than 150/90. - atenolol (TENORMIN) 25 MG tablet; Take 1 tablet (25 mg total) by mouth daily.  Dispense: 90 tablet; Refill: 3 - Basic Metabolic Panel; Future  2. Postoperative hypothyroidism Thyroid levels appropriate.  Synthroid refilled. - levothyroxine (SYNTHROID, LEVOTHROID) 100 MCG tablet; Take 1 tablet (100 mcg total) by mouth daily.  Dispense: 90  tablet; Refill: 3  3. Pure hypercholesterolemia Lipid panel stable from previous.  Continue simvastatin 10 mg.  This is been refilled  4. Osteopenia, unspecified location Has DEXA scan today.  5. Arthritis Meloxicam refilled.  Use sparingly given recent small rise in creatinine.  6. Need for prophylactic vaccination and inoculation against influenza Influenza vaccine administered today.   Orders Placed This Encounter  Procedures  . Basic Metabolic Panel    Standing  Status:   Future    Standing Expiration Date:   02/18/2019   Meds ordered this encounter  Medications  . losartan (COZAAR) 50 MG tablet    Sig: Take 1 tablet (50 mg total) by mouth daily.    Dispense:  90 tablet    Refill:  3  . levothyroxine (SYNTHROID, LEVOTHROID) 100 MCG tablet    Sig: Take 1 tablet (100 mcg total) by mouth daily.    Dispense:  90 tablet    Refill:  3  . atenolol (TENORMIN) 25 MG tablet    Sig: Take 1 tablet (25 mg total) by mouth daily.    Dispense:  90 tablet    Refill:  3  . meloxicam (MOBIC) 15 MG tablet    Sig: Take 1 tablet (15 mg total) by mouth daily.    Dispense:  20 tablet    Refill:  0  . simvastatin (ZOCOR) 10 MG tablet    Sig: Take 1 tablet (10 mg total) by mouth at bedtime.    Dispense:  90 tablet    Refill:  [place], [place] [phone]

## 2018-02-20 ENCOUNTER — Encounter: Payer: Self-pay | Admitting: Family Medicine

## 2018-02-20 DIAGNOSIS — M720 Palmar fascial fibromatosis [Dupuytren]: Secondary | ICD-10-CM | POA: Insufficient documentation

## 2018-02-23 DIAGNOSIS — M85852 Other specified disorders of bone density and structure, left thigh: Secondary | ICD-10-CM | POA: Diagnosis not present

## 2018-02-24 ENCOUNTER — Other Ambulatory Visit: Payer: Medicare Other

## 2018-02-24 DIAGNOSIS — I10 Essential (primary) hypertension: Secondary | ICD-10-CM

## 2018-02-25 LAB — BASIC METABOLIC PANEL
BUN / CREAT RATIO: 17 (ref 12–28)
BUN: 20 mg/dL (ref 8–27)
CHLORIDE: 103 mmol/L (ref 96–106)
CO2: 25 mmol/L (ref 20–29)
Calcium: 9.5 mg/dL (ref 8.7–10.3)
Creatinine, Ser: 1.19 mg/dL — ABNORMAL HIGH (ref 0.57–1.00)
GFR calc Af Amer: 52 mL/min/{1.73_m2} — ABNORMAL LOW (ref >59)
GFR, EST NON AFRICAN AMERICAN: 45 mL/min/{1.73_m2} — AB (ref >59)
GLUCOSE: 77 mg/dL (ref 65–99)
POTASSIUM: 4.5 mmol/L (ref 3.5–5.2)
Sodium: 143 mmol/L (ref 134–144)

## 2018-02-28 ENCOUNTER — Telehealth: Payer: Self-pay | Admitting: Family Medicine

## 2018-02-28 NOTE — Telephone Encounter (Signed)
Aware. 

## 2018-03-06 ENCOUNTER — Ambulatory Visit: Payer: Medicare Other | Admitting: *Deleted

## 2018-03-06 VITALS — BP 175/92 | HR 72

## 2018-03-06 DIAGNOSIS — Z0130 Encounter for examination of blood pressure without abnormal findings: Secondary | ICD-10-CM

## 2018-03-06 NOTE — Progress Notes (Signed)
Pt here for BP check BP 155 87 P 73 rck BP  BP 175 92 P 72  BP med changed to Losartan 50mg  on 02/17/2018  Pt will come back for BP rck in 1 week

## 2018-03-14 ENCOUNTER — Other Ambulatory Visit: Payer: Self-pay | Admitting: Family Medicine

## 2018-03-14 ENCOUNTER — Ambulatory Visit: Payer: Medicare Other | Admitting: *Deleted

## 2018-03-14 DIAGNOSIS — R7989 Other specified abnormal findings of blood chemistry: Secondary | ICD-10-CM

## 2018-03-14 DIAGNOSIS — Z0130 Encounter for examination of blood pressure without abnormal findings: Secondary | ICD-10-CM

## 2018-03-14 NOTE — Progress Notes (Signed)
Pt here for BP check BP 148 78 P 74 BP improving Pt will come back for a recheck 1 week

## 2018-03-21 ENCOUNTER — Ambulatory Visit: Payer: Medicare Other | Admitting: *Deleted

## 2018-03-21 VITALS — BP 145/87 | HR 77

## 2018-03-21 DIAGNOSIS — R7989 Other specified abnormal findings of blood chemistry: Secondary | ICD-10-CM | POA: Diagnosis not present

## 2018-03-21 DIAGNOSIS — Z0130 Encounter for examination of blood pressure without abnormal findings: Secondary | ICD-10-CM

## 2018-03-21 NOTE — Progress Notes (Signed)
Pt here for BP ck BP 173 86 P 73  rck BP BP 145 87 P 77

## 2018-03-22 LAB — BASIC METABOLIC PANEL
BUN / CREAT RATIO: 18 (ref 12–28)
BUN: 17 mg/dL (ref 8–27)
CO2: 23 mmol/L (ref 20–29)
Calcium: 9.3 mg/dL (ref 8.7–10.3)
Chloride: 100 mmol/L (ref 96–106)
Creatinine, Ser: 0.93 mg/dL (ref 0.57–1.00)
GFR, EST AFRICAN AMERICAN: 71 mL/min/{1.73_m2} (ref >59)
GFR, EST NON AFRICAN AMERICAN: 61 mL/min/{1.73_m2} (ref >59)
Glucose: 85 mg/dL (ref 65–99)
POTASSIUM: 4.3 mmol/L (ref 3.5–5.2)
SODIUM: 136 mmol/L (ref 134–144)

## 2018-03-28 ENCOUNTER — Other Ambulatory Visit: Payer: Self-pay

## 2018-05-11 DIAGNOSIS — M545 Low back pain: Secondary | ICD-10-CM | POA: Diagnosis not present

## 2018-05-18 ENCOUNTER — Encounter: Payer: Self-pay | Admitting: Podiatry

## 2018-05-18 ENCOUNTER — Ambulatory Visit (INDEPENDENT_AMBULATORY_CARE_PROVIDER_SITE_OTHER): Payer: Medicare Other | Admitting: Podiatry

## 2018-05-18 DIAGNOSIS — L600 Ingrowing nail: Secondary | ICD-10-CM

## 2018-05-18 DIAGNOSIS — S99921A Unspecified injury of right foot, initial encounter: Secondary | ICD-10-CM

## 2018-05-18 DIAGNOSIS — M779 Enthesopathy, unspecified: Secondary | ICD-10-CM | POA: Diagnosis not present

## 2018-05-18 DIAGNOSIS — M778 Other enthesopathies, not elsewhere classified: Secondary | ICD-10-CM

## 2018-05-18 MED ORDER — NEOMYCIN-POLYMYXIN-HC 1 % OT SOLN: OTIC | 1 refills | Status: DC

## 2018-05-18 NOTE — Patient Instructions (Signed)

## 2018-05-18 NOTE — Progress Notes (Signed)
She presents today for follow-up of osteoarthritis of the midfoot which she states is doing pretty good now but she is having concerns about discoloration of the hallux nail right and an ingrown border second digit of the right foot medial.  States is been going on now for quite some time she is concerned that it may be fungus.  Objective: Vital signs are stable she is alert and oriented x3.  Pulses are palpable.  Hallux right demonstrates a subungual seroma or hematoma and sharp incurvated nail margin along the tibial border of the second digit of the right foot.  Assessment: Ingrown nail tibial border second digit right foot and seroma hematoma hallux right.  Plan: Discussed etiology pathology conservative surgical therapy at this point time went ahead and performed a nail avulsion hallux left and a matrixectomy second digit tibial border right.  She was provided with both oral and written home-going instructions tolerated procedure well without complications.  She was given a prescription for Cortisporin Otic to be applied twice daily after soaking.  Follow-up with her in 2 weeks for reevaluation.

## 2018-05-19 ENCOUNTER — Telehealth: Payer: Self-pay | Admitting: Podiatry

## 2018-05-19 NOTE — Telephone Encounter (Signed)
Pt was seen yesterday and had a toenail removed and has some questions regarding the medication. Pt would like to know if the drops only need to be applied to the area where the toenail was removed and also stated that the medication bottle said to only use the drops for 10 days but her follow up appt is more than 10 days away. Should pt stop use after 10 days or continue use until appt.?

## 2018-05-19 NOTE — Telephone Encounter (Signed)
I called pt states she had 2 toenails worked on and wanted to know if she had to put the drops on both toes, because the drop bottle states one toe. Pt states she had the entire toenail removed from the big toe and an ingrown removed from the next toe. I told pt to put the drops on both toenail procedure areas. Pt states the drops are expensive, should she put 1 or 2 drops. I told pt to put one drop on the big toe area and see if covered the procedure site, and the smaller toe procedure site and use the drops until her follow up visit. Pt asked where she needed to put the drop, if 12 o'clock or 6 o'clock to check for coverage. I told pt to put the drop on the center of the big toenail procedure area like the center of the clock face to see how it spread, then proceed to the smaller toenail procedure site. Pt states understanding.

## 2018-06-01 ENCOUNTER — Ambulatory Visit (INDEPENDENT_AMBULATORY_CARE_PROVIDER_SITE_OTHER): Payer: Self-pay

## 2018-06-01 DIAGNOSIS — S99921A Unspecified injury of right foot, initial encounter: Secondary | ICD-10-CM

## 2018-06-01 DIAGNOSIS — L600 Ingrowing nail: Secondary | ICD-10-CM

## 2018-06-01 NOTE — Patient Instructions (Signed)

## 2018-06-05 NOTE — Progress Notes (Signed)
Patient seen today for follow-up appointment, recent procedure performed on 05/18/2018 removal of ingrown nail tibial border second digit right foot, removal of nail due to seroma hematoma hallux right.  She states that overall the area is a little tender but definitely feels better and she thinks it is healing well.  No redness, no swelling, no drainage, no erythema, no other signs and symptoms of infection.  The area is beginning to scab over and is healing well.  Discussed signs and symptoms of infection with patient, verbal and written instructions were given.  She is to follow-up as needed with any acute symptom changes.

## 2018-06-23 ENCOUNTER — Encounter: Payer: Self-pay | Admitting: Family Medicine

## 2018-06-23 DIAGNOSIS — Z9010 Acquired absence of unspecified breast and nipple: Secondary | ICD-10-CM | POA: Insufficient documentation

## 2018-09-18 ENCOUNTER — Telehealth: Payer: Self-pay | Admitting: Family Medicine

## 2018-09-18 NOTE — Telephone Encounter (Signed)
Faxed last labs per pt request

## 2018-09-22 ENCOUNTER — Telehealth: Payer: Self-pay | Admitting: Family Medicine

## 2018-11-02 ENCOUNTER — Telehealth: Payer: Self-pay | Admitting: Family Medicine

## 2018-11-02 ENCOUNTER — Other Ambulatory Visit: Payer: Self-pay | Admitting: Family Medicine

## 2018-11-02 DIAGNOSIS — E7800 Pure hypercholesterolemia, unspecified: Secondary | ICD-10-CM

## 2018-11-02 DIAGNOSIS — M8589 Other specified disorders of bone density and structure, multiple sites: Secondary | ICD-10-CM

## 2018-11-02 DIAGNOSIS — E890 Postprocedural hypothyroidism: Secondary | ICD-10-CM

## 2018-11-02 DIAGNOSIS — I10 Essential (primary) hypertension: Secondary | ICD-10-CM

## 2018-11-02 NOTE — Telephone Encounter (Signed)
Patient would like to know if she could come in before her appt in September and get her labwork drawn.  Could you put future orders in so they will know what labs she needs?  Thank you!

## 2018-11-02 NOTE — Telephone Encounter (Signed)
Labs placed.  Tried to order Vit D but insurance flagged that may not be covered. If patient willing to sign waiver, would consider having that drawn as well given osteopenia.

## 2018-11-02 NOTE — Telephone Encounter (Signed)
Please advise 

## 2018-11-03 NOTE — Telephone Encounter (Signed)
Pt notified of lab order.

## 2018-11-13 ENCOUNTER — Telehealth: Payer: Self-pay | Admitting: Family Medicine

## 2018-11-13 NOTE — Telephone Encounter (Signed)
Patient aware.

## 2018-11-13 NOTE — Telephone Encounter (Signed)
Patient states that are doing away with levothyroxine and going with name brand Euthyrox and this medication is more expensive.

## 2018-11-13 NOTE — Telephone Encounter (Signed)
The only other alternative would be brand name Synthroid.  She can call her pharmacy to see if the Synthroid is cheaper than Euthyrox, but I doubt that it would be.

## 2018-11-23 ENCOUNTER — Encounter: Payer: Self-pay | Admitting: Physician Assistant

## 2018-11-23 ENCOUNTER — Ambulatory Visit (INDEPENDENT_AMBULATORY_CARE_PROVIDER_SITE_OTHER): Payer: Medicare Other | Admitting: Physician Assistant

## 2018-11-23 DIAGNOSIS — I10 Essential (primary) hypertension: Secondary | ICD-10-CM | POA: Diagnosis not present

## 2018-11-23 MED ORDER — LOSARTAN POTASSIUM 100 MG PO TABS: 100.0000 mg | ORAL_TABLET | Freq: Every day | ORAL | 1 refills | Status: DC

## 2018-11-23 NOTE — Patient Instructions (Signed)

## 2018-11-23 NOTE — Progress Notes (Signed)
Telephone visit  Subjective: [id number] PCP: April Norlander, DO [id number] April Weber is April 73 y.o. female calls for telephone consult today. Patient provides verbal consent for consult held via phone.  Patient is identified with 2 separate identifiers.  At this time the entire area is on COVID-19 social distancing and stay home orders are in place.  Patient is of higher risk and therefore we are performing this by April virtual method.  Location of patient: home Location of provider: HOME Others present for call: no  She reports having elevated BP readings over the past few weeks.  She had had fairly well-controlled blood pressure readings for some time.  She typically is around 140/90 may be April little lower at times.  However she has been having readings in the 150s over 90s fairly consistently  She also reports that she only has 1 kidney so she tries to be particularly good about it.  Today her readings were 157/98 and 144/94.  She is currently on losartan 50 mg 1 daily.  She also takes atenolol 25 mg 1 daily.  We have discussed increasing her losartan to 100mg .  We can send April new prescription in.  It is okay if she wants to take 2 of the current tablets that she has.  And then we will plan for her to have April recheck in 3 to 4 weeks.  We will pass this note on to Dr. Lajuana Weber, her PCP.  She can call back if there are any questions.   ROS: Per HPI  Allergies  Allergen Reactions  . Codeine     REACTION: makes sick  . Doxycycline     REACTION: whelps hives  . Iodine     REACTION: breakout  . Penicillins     REACTION: whelps hives   Past Medical History:  Diagnosis Date  . Allergy    seasonal  . Chronic kidney disease    has one kidney  . Hyperlipidemia   . Hypertension   . Osteopenia   . Thyroid disease     Current Outpatient Medications:  .  aspirin 81 MG EC tablet, Take 81 mg by mouth daily.  , Disp: , Rfl:  .  atenolol (TENORMIN) 25 MG tablet, Take 1 tablet (25  mg total) by mouth daily., Disp: 90 tablet, Rfl: 3 .  Calcium 600-200 MG-UNIT per tablet, Take 1 tablet by mouth 2 (two) times daily. , Disp: , Rfl:  .  fish oil-omega-3 fatty acids 1000 MG capsule, Take 1 g by mouth daily. Takes 1 tab 1400mg , Disp: , Rfl:  .  levothyroxine (SYNTHROID, LEVOTHROID) 100 MCG tablet, Take 1 tablet (100 mcg total) by mouth daily., Disp: 90 tablet, Rfl: 3 .  meloxicam (MOBIC) 15 MG tablet, Take 1 tablet (15 mg total) by mouth daily., Disp: 20 tablet, Rfl: 0 .  Multiple Vitamin (MULTIVITAMIN) capsule, Take 1 capsule by mouth daily.  , Disp: , Rfl:  .  NEOMYCIN-POLYMYXIN-HYDROCORTISONE (CORTISPORIN) 1 % SOLN OTIC solution, Apply 1-2 drops to toe BID after soaking, Disp: 10 mL, Rfl: 1 .  simvastatin (ZOCOR) 10 MG tablet, Take 1 tablet (10 mg total) by mouth at bedtime., Disp: 90 tablet, Rfl: 3 .  losartan (COZAAR) 100 MG tablet, Take 1 tablet (100 mg total) by mouth daily., Disp: 30 tablet, Rfl: 1  Assessment/ Plan: 73 y.o. female   1. Essential hypertension - losartan (COZAAR) 100 MG tablet; Take 1 tablet (100 mg total) by mouth daily.  Dispense: 30  tablet; Refill: 1   Return in about 4 weeks (around 12/21/2018) for follow with Dr. Lajuana Weber.  Continue all other maintenance medications as listed above.  Start time: 10:12 AM End time: 10:24 AM  Meds ordered this encounter  Medications  . losartan (COZAAR) 100 MG tablet    Sig: Take 1 tablet (100 mg total) by mouth daily.    Dispense:  30 tablet    Refill:  1    Order Specific Question:   Supervising Provider    Answer:   April Weber [[id number]]    April Nearing PA-C [place] [phone]

## 2018-11-24 ENCOUNTER — Other Ambulatory Visit: Payer: Self-pay | Admitting: Physician Assistant

## 2018-11-24 DIAGNOSIS — I10 Essential (primary) hypertension: Secondary | ICD-10-CM

## 2018-11-24 NOTE — Progress Notes (Signed)
I did this patient by telephone yesterday and increase her losartan from 50 to 100 mg.  Dr. Gottschalk in my have talked about it and it is recommended that she come back in in about a week or so to have her be met rechecked just to make sure.  She does have 1 kidney.  And we just 1 make sure the new dosing is not affecting her kidney.  I will place the order.  Can you just let her know? 

## 2018-12-01 ENCOUNTER — Telehealth: Payer: Self-pay | Admitting: *Deleted

## 2018-12-01 NOTE — Telephone Encounter (Signed)
-----   Message from Terald Sleeper, [place] sent at 11/24/2018 10:13 AM EDT -----   ----- Message ----- From: [name], DO Sent: 11/24/2018   7:56 AM EDT To: Terald Sleeper, PA-C  Needs BMP check in 1 week given increased dose of ARB and singular kidney  ----- Message ----- From: Terald Sleeper, PA-C Sent: 11/23/2018  10:32 AM EDT To: [name], DO

## 2018-12-01 NOTE — Telephone Encounter (Signed)
Pt notified of recommendation Verbalizes understanding 

## 2018-12-05 ENCOUNTER — Other Ambulatory Visit: Payer: Self-pay

## 2018-12-05 ENCOUNTER — Other Ambulatory Visit: Payer: Medicare Other

## 2018-12-05 DIAGNOSIS — I10 Essential (primary) hypertension: Secondary | ICD-10-CM | POA: Diagnosis not present

## 2018-12-06 LAB — BASIC METABOLIC PANEL
BUN/Creatinine Ratio: 16 (ref 12–28)
BUN: 16 mg/dL (ref 8–27)
CO2: 22 mmol/L (ref 20–29)
Calcium: 9.7 mg/dL (ref 8.7–10.3)
Chloride: 98 mmol/L (ref 96–106)
Creatinine, Ser: 1 mg/dL (ref 0.57–1.00)
GFR calc Af Amer: 64 mL/min/{1.73_m2} (ref >59)
GFR calc non Af Amer: 56 mL/min/{1.73_m2} — ABNORMAL LOW (ref >59)
Glucose: 124 mg/dL — ABNORMAL HIGH (ref 65–99)
Potassium: 4.8 mmol/L (ref 3.5–5.2)
Sodium: 137 mmol/L (ref 134–144)

## 2018-12-11 ENCOUNTER — Telehealth: Payer: Self-pay | Admitting: *Deleted

## 2018-12-11 NOTE — Telephone Encounter (Signed)
Patient aware and Nurse visit appt made for BP check on Wednesday 12/13/2018 at 10am

## 2018-12-11 NOTE — Telephone Encounter (Signed)
-----   Message from [name], DO sent at 12/11/2018  1:11 PM EDT ----- I reviewed patient's chart.  Her creatinine is stable despite increase dose of ARB.  Please have her come in for formal in office BP check.  She is had some outlier BPs at home up into the 160s over 90s.  May need to consider adding additional blood pressure medication if persistently elevated above target of 150/90.

## 2018-12-12 ENCOUNTER — Other Ambulatory Visit: Payer: Self-pay

## 2018-12-13 ENCOUNTER — Ambulatory Visit: Payer: Medicare Other | Admitting: *Deleted

## 2018-12-13 DIAGNOSIS — Z0130 Encounter for examination of blood pressure without abnormal findings: Secondary | ICD-10-CM

## 2018-12-13 NOTE — Progress Notes (Signed)
Pt here for BP check BP 194 86 P 68  Rck BP BP 171 93 P 73

## 2018-12-13 NOTE — Progress Notes (Signed)
Blood pressure still high despite increased dose of Losartan to 100mg .  Please make sure patient took med prior to BP check.  If so, plan for Norvasc 5mg  daily and f/u w/ me in office in 2-3 weeks for BP recheck.

## 2018-12-15 ENCOUNTER — Telehealth: Payer: Self-pay | Admitting: *Deleted

## 2018-12-15 MED ORDER — AMLODIPINE BESYLATE 5 MG PO TABS: 5.0000 mg | ORAL_TABLET | Freq: Every day | ORAL | 0 refills | Status: DC

## 2018-12-15 NOTE — Telephone Encounter (Signed)
Pt notified of RX and recommendation appt scheduled for rck

## 2018-12-22 ENCOUNTER — Telehealth: Payer: Self-pay | Admitting: Family Medicine

## 2018-12-22 NOTE — Telephone Encounter (Signed)
Spoke with pt regarding nausea Pt has had 2 episodes of nausea since starting Amlodipine Episodes have occurred days apart Doubt medication is causing nausea Pt will continue to monitor and call back if sxs persist or worsen

## 2019-01-04 ENCOUNTER — Other Ambulatory Visit: Payer: Self-pay

## 2019-01-05 ENCOUNTER — Ambulatory Visit: Payer: Medicare Other | Admitting: Family Medicine

## 2019-01-05 ENCOUNTER — Telehealth: Payer: Self-pay | Admitting: Family Medicine

## 2019-01-05 NOTE — Telephone Encounter (Signed)
Absolutely ok to wait and yes, please drop off BP readings so I may review.  Please apologize to her for any inconvenience.

## 2019-01-05 NOTE — Telephone Encounter (Signed)
Patient aware and verbalizes understanding. 

## 2019-01-05 NOTE — Telephone Encounter (Signed)
Can patient be a tele visit or does it need to be in office ? Please advise and send to pools.

## 2019-01-08 ENCOUNTER — Other Ambulatory Visit: Payer: Self-pay

## 2019-01-09 ENCOUNTER — Other Ambulatory Visit: Payer: Self-pay | Admitting: Family Medicine

## 2019-01-09 ENCOUNTER — Ambulatory Visit (INDEPENDENT_AMBULATORY_CARE_PROVIDER_SITE_OTHER): Payer: Medicare Other | Admitting: *Deleted

## 2019-01-09 DIAGNOSIS — Z1231 Encounter for screening mammogram for malignant neoplasm of breast: Secondary | ICD-10-CM

## 2019-01-09 DIAGNOSIS — Z23 Encounter for immunization: Secondary | ICD-10-CM

## 2019-01-09 NOTE — Progress Notes (Signed)
Pt given Tenivac vaccine Tolerated well

## 2019-01-31 ENCOUNTER — Other Ambulatory Visit: Payer: Medicare Other

## 2019-01-31 ENCOUNTER — Other Ambulatory Visit: Payer: Self-pay

## 2019-01-31 DIAGNOSIS — E890 Postprocedural hypothyroidism: Secondary | ICD-10-CM

## 2019-01-31 DIAGNOSIS — E7800 Pure hypercholesterolemia, unspecified: Secondary | ICD-10-CM | POA: Diagnosis not present

## 2019-01-31 DIAGNOSIS — M8589 Other specified disorders of bone density and structure, multiple sites: Secondary | ICD-10-CM

## 2019-01-31 DIAGNOSIS — I10 Essential (primary) hypertension: Secondary | ICD-10-CM

## 2019-02-01 LAB — CMP14+EGFR
ALT: 18 IU/L (ref 0–32)
AST: 25 IU/L (ref 0–40)
Albumin/Globulin Ratio: 1.5 (ref 1.2–2.2)
Albumin: 4 g/dL (ref 3.7–4.7)
Alkaline Phosphatase: 78 IU/L (ref 39–117)
BUN/Creatinine Ratio: 16 (ref 12–28)
BUN: 17 mg/dL (ref 8–27)
Bilirubin Total: 0.5 mg/dL (ref 0.0–1.2)
CO2: 24 mmol/L (ref 20–29)
Calcium: 10 mg/dL (ref 8.7–10.3)
Chloride: 100 mmol/L (ref 96–106)
Creatinine, Ser: 1.04 mg/dL — ABNORMAL HIGH (ref 0.57–1.00)
GFR calc Af Amer: 61 mL/min/{1.73_m2} (ref >59)
GFR calc non Af Amer: 53 mL/min/{1.73_m2} — ABNORMAL LOW (ref >59)
Globulin, Total: 2.7 g/dL (ref 1.5–4.5)
Glucose: 84 mg/dL (ref 65–99)
Potassium: 4.7 mmol/L (ref 3.5–5.2)
Sodium: 139 mmol/L (ref 134–144)
Total Protein: 6.7 g/dL (ref 6.0–8.5)

## 2019-02-01 LAB — LIPID PANEL
Chol/HDL Ratio: 3 ratio (ref 0.0–4.4)
Cholesterol, Total: 148 mg/dL (ref 100–199)
HDL: 49 mg/dL (ref >39)
LDL Chol Calc (NIH): 81 mg/dL (ref 0–99)
Triglycerides: 96 mg/dL (ref 0–149)
VLDL Cholesterol Cal: 18 mg/dL (ref 5–40)

## 2019-02-01 LAB — THYROID PANEL WITH TSH
Free Thyroxine Index: 2.8 (ref 1.2–4.9)
T3 Uptake Ratio: 27 % (ref 24–39)
T4, Total: 10.3 ug/dL (ref 4.5–12.0)
TSH: 1.51 u[IU]/mL (ref 0.450–4.500)

## 2019-02-02 ENCOUNTER — Other Ambulatory Visit: Payer: Self-pay | Admitting: Family Medicine

## 2019-02-02 ENCOUNTER — Telehealth: Payer: Self-pay | Admitting: Family Medicine

## 2019-02-02 DIAGNOSIS — E890 Postprocedural hypothyroidism: Secondary | ICD-10-CM

## 2019-02-02 MED ORDER — LEVOTHYROXINE SODIUM 100 MCG PO TABS: 100.0000 ug | ORAL_TABLET | Freq: Every day | ORAL | 3 refills | Status: DC

## 2019-02-02 NOTE — Telephone Encounter (Signed)
Patient isn't sure that her cholesterol med needs to be increased because her cholesterol levels are so much better. Will talk with Dr Darnell Level about it when she comes for her appt next week.

## 2019-02-08 ENCOUNTER — Other Ambulatory Visit: Payer: Self-pay

## 2019-02-09 ENCOUNTER — Ambulatory Visit (INDEPENDENT_AMBULATORY_CARE_PROVIDER_SITE_OTHER): Payer: Medicare Other | Admitting: Family Medicine

## 2019-02-09 ENCOUNTER — Telehealth: Payer: Self-pay | Admitting: Family Medicine

## 2019-02-09 ENCOUNTER — Encounter: Payer: Self-pay | Admitting: Family Medicine

## 2019-02-09 VITALS — BP 141/79 | HR 68 | Temp 98.4°F | Ht 66.0 in | Wt 176.0 lb

## 2019-02-09 DIAGNOSIS — I10 Essential (primary) hypertension: Secondary | ICD-10-CM

## 2019-02-09 DIAGNOSIS — N1831 Chronic kidney disease, stage 3a: Secondary | ICD-10-CM

## 2019-02-09 DIAGNOSIS — Z23 Encounter for immunization: Secondary | ICD-10-CM | POA: Diagnosis not present

## 2019-02-09 DIAGNOSIS — E7800 Pure hypercholesterolemia, unspecified: Secondary | ICD-10-CM

## 2019-02-09 MED ORDER — ATENOLOL 25 MG PO TABS: 25.0000 mg | ORAL_TABLET | Freq: Every day | ORAL | 3 refills | Status: DC

## 2019-02-09 MED ORDER — LOSARTAN POTASSIUM 100 MG PO TABS: 100.0000 mg | ORAL_TABLET | Freq: Every day | ORAL | 3 refills | Status: DC

## 2019-02-09 MED ORDER — SIMVASTATIN 20 MG PO TABS: 20.0000 mg | ORAL_TABLET | Freq: Every day | ORAL | 3 refills | Status: DC

## 2019-02-09 MED ORDER — AMLODIPINE BESYLATE 5 MG PO TABS: 5.0000 mg | ORAL_TABLET | Freq: Every day | ORAL | 3 refills | Status: DC

## 2019-02-09 MED ORDER — SIMVASTATIN 10 MG PO TABS: 10.0000 mg | ORAL_TABLET | Freq: Every day | ORAL | 3 refills | Status: DC

## 2019-02-09 NOTE — Patient Instructions (Signed)
Your risk of stroke and heart attack is still pretty high (though lower than previous calculation).  You currently have about a 22% chance of stroke and heart attack in the next 10 years.  I would still strongly consider increasing the statin dose.  The 10-year ASCVD risk score April Weber., et al., 2013) is: 21.9%   Values used to calculate the score:     Age: 73 years     Sex: Female     Is Non-Hispanic African American: No     Diabetic: No     Tobacco smoker: No     Systolic Blood Pressure: Q000111Q mmHg     Is BP treated: Yes     HDL Cholesterol: 49 mg/dL     Total Cholesterol: 148 mg/dL

## 2019-02-09 NOTE — Progress Notes (Signed)
Subjective: CC: f.u BP PCP: April Norlander, DO [id number] [name] is a 73 y.o. female presenting to clinic today for:  1.  Hypertension with hyperlipidemia Patient reports compliance with Norvasc 5 mg daily, losartan 100 mg daily and atenolol 25 mg daily.  She had had intermittent episodes of nausea but she relates this to not drinking enough water with her nighttime medications.  She otherwise is feeling well.  No chest pain, shortness of breath, lower extremity edema or dizziness.  Blood pressures at home have been ranging anywhere from 108 over 70s to 130s over 70s.  She is compliant with Zocor each evening and is not sure if she wants to increase the dose.  Denies any adverse side effects from the medication including myalgia.   ROS: Per HPI  Allergies  Allergen Reactions  . Codeine     REACTION: makes sick  . Doxycycline     REACTION: whelps hives  . Iodine     REACTION: breakout  . Penicillins     REACTION: whelps hives   Past Medical History:  Diagnosis Date  . Allergy    seasonal  . Chronic kidney disease    has one kidney  . Hyperlipidemia   . Hypertension   . Osteopenia   . Thyroid disease     Current Outpatient Medications:  .  amLODipine (NORVASC) 5 MG tablet, Take 1 tablet (5 mg total) by mouth daily., Disp: 90 tablet, Rfl: 0 .  aspirin 81 MG EC tablet, Take 81 mg by mouth daily.  , Disp: , Rfl:  .  atenolol (TENORMIN) 25 MG tablet, Take 1 tablet (25 mg total) by mouth daily., Disp: 90 tablet, Rfl: 3 .  Calcium 600-200 MG-UNIT per tablet, Take 1 tablet by mouth 2 (two) times daily. , Disp: , Rfl:  .  fish oil-omega-3 fatty acids 1000 MG capsule, Take 1 g by mouth daily. Takes 1 tab 1400mg , Disp: , Rfl:  .  levothyroxine (SYNTHROID) 100 MCG tablet, Take 1 tablet (100 mcg total) by mouth daily., Disp: 90 tablet, Rfl: 3 .  losartan (COZAAR) 100 MG tablet, Take 1 tablet (100 mg total) by mouth daily., Disp: 30 tablet, Rfl: 1 .  meloxicam (MOBIC) 15 MG  tablet, Take 1 tablet (15 mg total) by mouth daily., Disp: 20 tablet, Rfl: 0 .  Multiple Vitamin (MULTIVITAMIN) capsule, Take 1 capsule by mouth daily.  , Disp: , Rfl:  .  NEOMYCIN-POLYMYXIN-HYDROCORTISONE (CORTISPORIN) 1 % SOLN OTIC solution, Apply 1-2 drops to toe BID after soaking, Disp: 10 mL, Rfl: 1 .  simvastatin (ZOCOR) 10 MG tablet, Take 1 tablet (10 mg total) by mouth at bedtime., Disp: 90 tablet, Rfl: 3 Social History   Socioeconomic History  . Marital status: Single    Spouse name: Divorced   . Number of children: 1  . Years of education: Not on file  . Highest education level: Not on file  Occupational History  . Occupation: Retired    Comment: worked at [employer] in [place]  . Financial resource strain: Not on file  . Food insecurity    Worry: Not on file    Inability: Not on file  . Transportation needs    Medical: Not on file    Non-medical: Not on file  Tobacco Use  . Smoking status: Former Smoker    Packs/day: 0.25    Types: Cigarettes    Quit date: 05/11/1967    Years since quitting:  51.7  . Smokeless tobacco: Never Used  Substance and Sexual Activity  . Alcohol use: No  . Drug use: No  . Sexual activity: Never  Lifestyle  . Physical activity    Days per week: Not on file    Minutes per session: Not on file  . Stress: Not on file  Relationships  . Social Herbalist on phone: Not on file    Gets together: Not on file    Attends religious service: Not on file    Active member of club or organization: Not on file    Attends meetings of clubs or organizations: Not on file    Relationship status: Not on file  . Intimate partner violence    Fear of current or ex partner: Not on file    Emotionally abused: Not on file    Physically abused: Not on file    Forced sexual activity: Not on file  Other Topics Concern  . Not on file  Social History Narrative   Lives alone   Family History  Problem  Relation Age of Onset  . Heart attack Mother   . Alcohol abuse Mother   . Breast cancer Maternal Aunt     Objective: Office vital signs reviewed. BP (!) 141/79   Pulse 68   Temp 98.4 F (36.9 C) (Temporal)   Ht 5\' 6"  (1.676 m)   Wt 176 lb (79.8 kg)   SpO2 100%   BMI 28.41 kg/m   Physical Examination:  General: Awake, alert, well nourished, No acute distress HEENT: Normal, sclera white, MMM Cardio: regular rate and rhythm, S1S2 heard, no murmurs appreciated Pulm: clear to auscultation bilaterally, no wheezes, rhonchi or rales; normal work of breathing on room air Extremities: warm, well perfused, No edema, cyanosis or clubbing; +2 pulses bilaterally  The 10-year ASCVD risk score Mikey Bussing DC Jr., et al., 2013) is: 21.9%   Values used to calculate the score:     Age: 55 years     Sex: Female     Is Non-Hispanic African American: No     Diabetic: No     Tobacco smoker: No     Systolic Blood Pressure: Q000111Q mmHg     Is BP treated: Yes     HDL Cholesterol: 49 mg/dL     Total Cholesterol: 148 mg/dL  Assessment/ Plan: 73 y.o. female   1. Essential hypertension Blood pressure under control for age.  Continue current regimen.  Her blood pressures at home look better than even what we had here in office.  She has renal disease based on last couple of years creatinines.  This appears to be stage IIIa.  We discussed adequate hydration, avoidance of NSAIDs.  Plan for interval check with next visit - amLODipine (NORVASC) 5 MG tablet; Take 1 tablet (5 mg total) by mouth daily.  Dispense: 90 tablet; Refill: 3 - atenolol (TENORMIN) 25 MG tablet; Take 1 tablet (25 mg total) by mouth daily.  Dispense: 90 tablet; Refill: 3 - losartan (COZAAR) 100 MG tablet; Take 1 tablet (100 mg total) by mouth daily.  Dispense: 90 tablet; Refill: 3  2. Pure hypercholesterolemia We discussed that ASCVD risk or was slightly improved with improvement in blood pressure but is still quite elevated.  I would  recommend increased dose of Zocor.  She will consider this.  3. Stage 3a chronic kidney disease   No orders of the defined types were placed in this encounter.  No orders of the  defined types were placed in this encounter.    April Norlander, DO [place] [phone]

## 2019-02-09 NOTE — Telephone Encounter (Signed)
Patient aware and verbalizes understanding. 

## 2019-02-09 NOTE — Telephone Encounter (Signed)
Patient seen Dr. Darnell Level today and is uncertain if she is supposed to stay on the zocor 10mg  or increase it? States Dr. Darnell Level told her she would recommend increasing it but never told her if she needed to or not.  States she will increase it if Dr. Darnell Level feels it is necessary. Please advise and send back to pools

## 2019-02-09 NOTE — Telephone Encounter (Signed)
Yes start taking 2 tablets at bedtime.  I will adjust her rx (she will only have to take 1 tablet of the 20mg  I am sending in)

## 2019-02-09 NOTE — Addendum Note (Signed)
Addended by: [name] on: 02/09/2019 10:44 AM   Modules accepted: Orders

## 2019-02-12 ENCOUNTER — Telehealth: Payer: Self-pay | Admitting: Family Medicine

## 2019-02-12 NOTE — Telephone Encounter (Signed)
Please advise on questions . 

## 2019-02-12 NOTE — Telephone Encounter (Signed)
Baby aspirin is fine.  Yes tylenol is ok for pain

## 2019-02-12 NOTE — Telephone Encounter (Signed)
Aware. 

## 2019-02-28 ENCOUNTER — Other Ambulatory Visit: Payer: Self-pay

## 2019-02-28 ENCOUNTER — Ambulatory Visit
Admission: RE | Admit: 2019-02-28 | Discharge: 2019-02-28 | Disposition: A | Discharge status: Home / Self Care | Payer: Medicare Other | Source: Ambulatory Visit | Attending: Family Medicine | Admitting: Family Medicine

## 2019-02-28 DIAGNOSIS — Z1231 Encounter for screening mammogram for malignant neoplasm of breast: Secondary | ICD-10-CM

## 2019-02-28 IMAGING — MG DIGITAL SCREENING UNILAT LEFT W/ TOMO W/ CAD
4 series · 4 of 12 positions shown · non-contrast
Comparison: Previous exam(s).

CLINICAL DATA: Screening.

EXAM:
DIGITAL SCREENING UNILATERAL LEFT MAMMOGRAM WITH CAD AND TOMO

[L CC synth-2D]
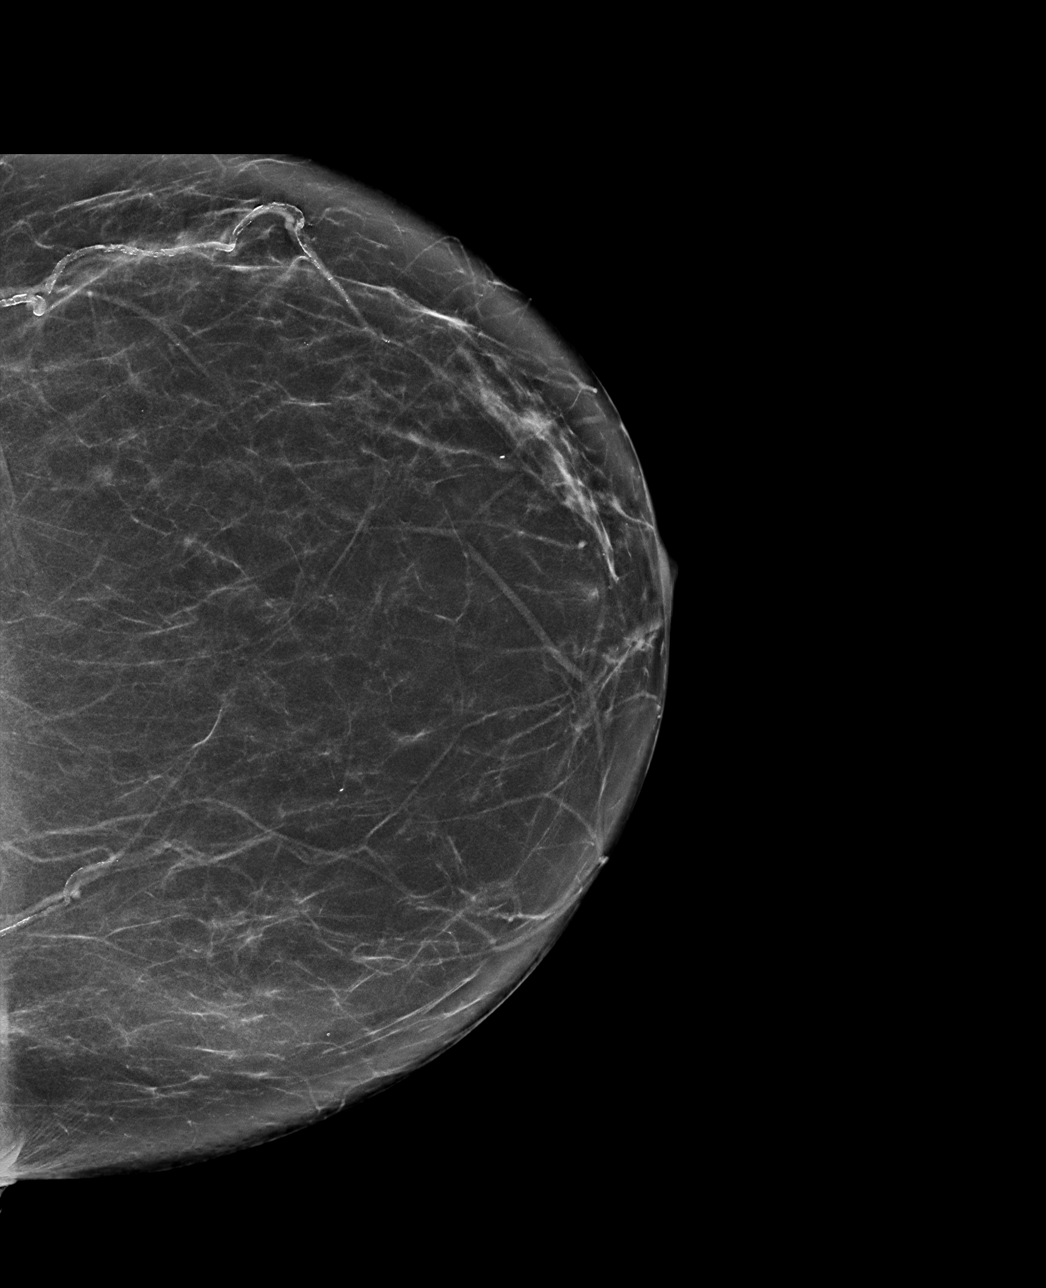

[L MLO synth-2D]
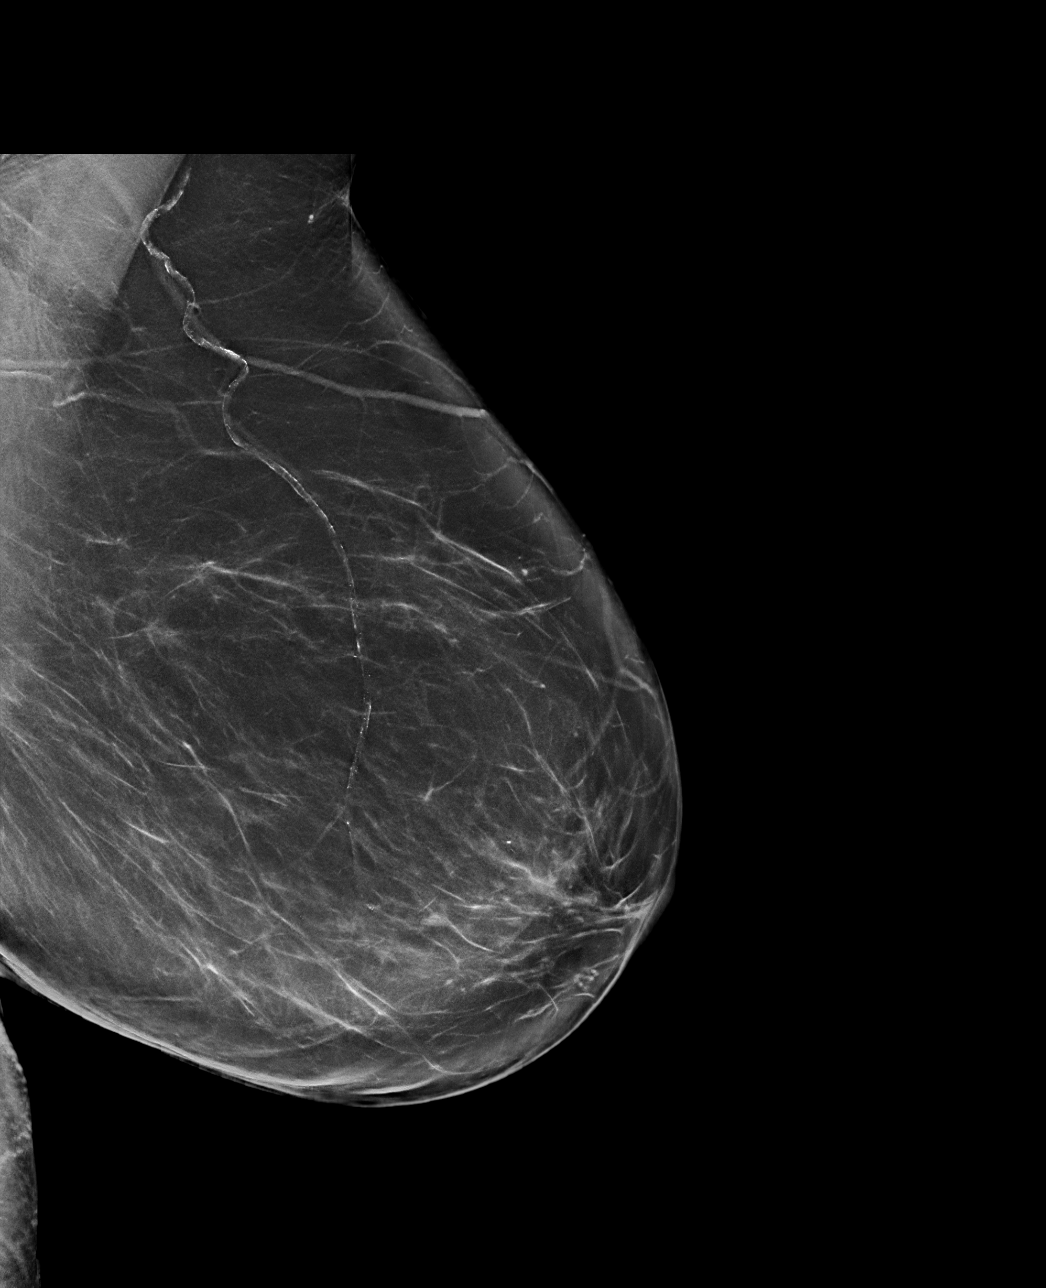

[L MLO tomo · tomo slice 45/89.0]
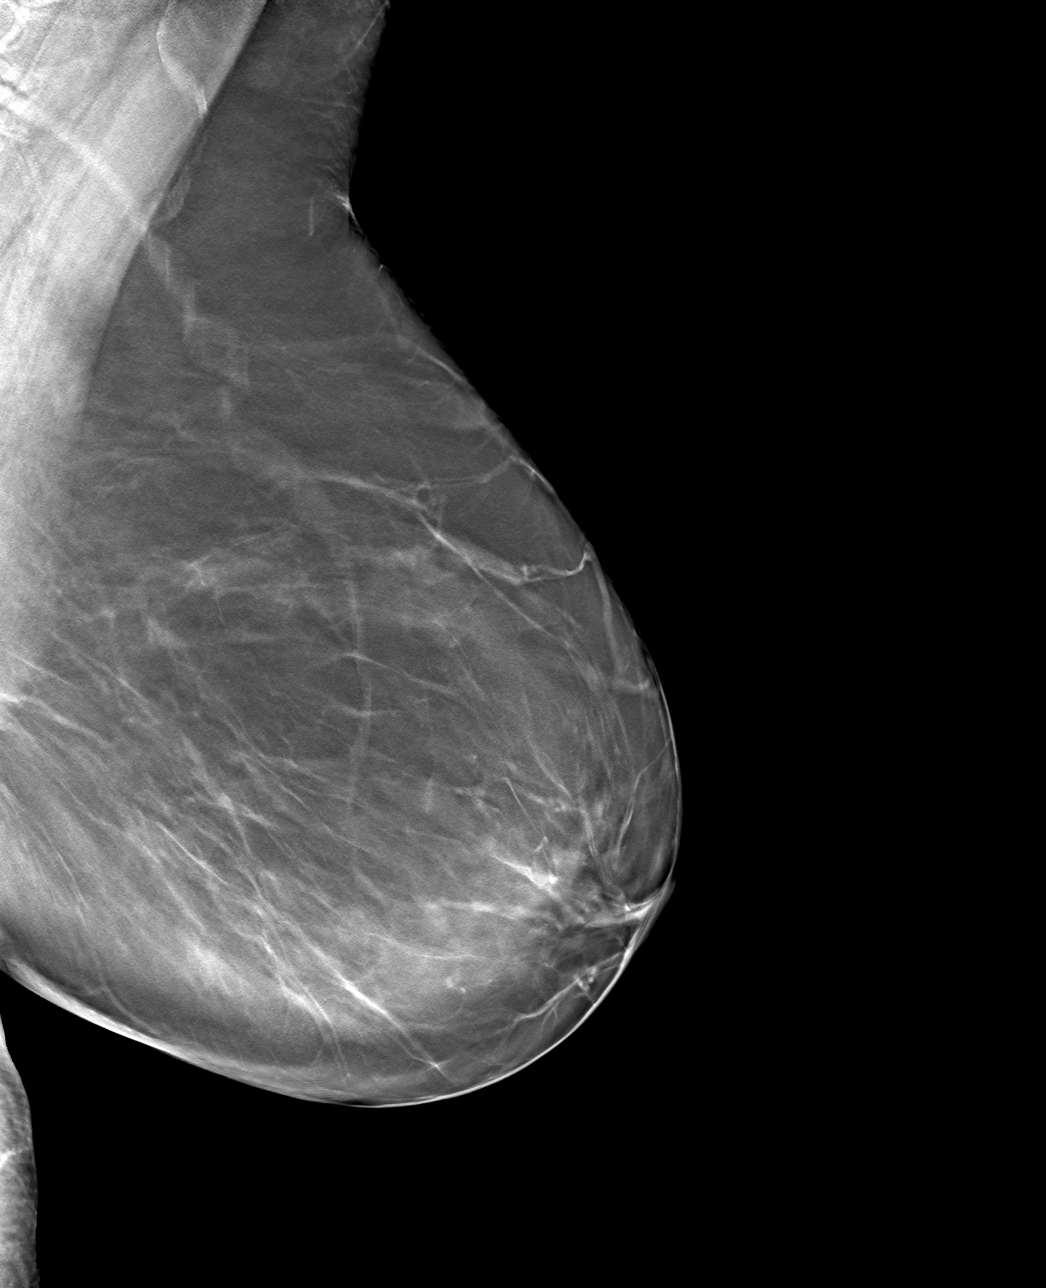

[L CC tomo · tomo slice 35/70.0]
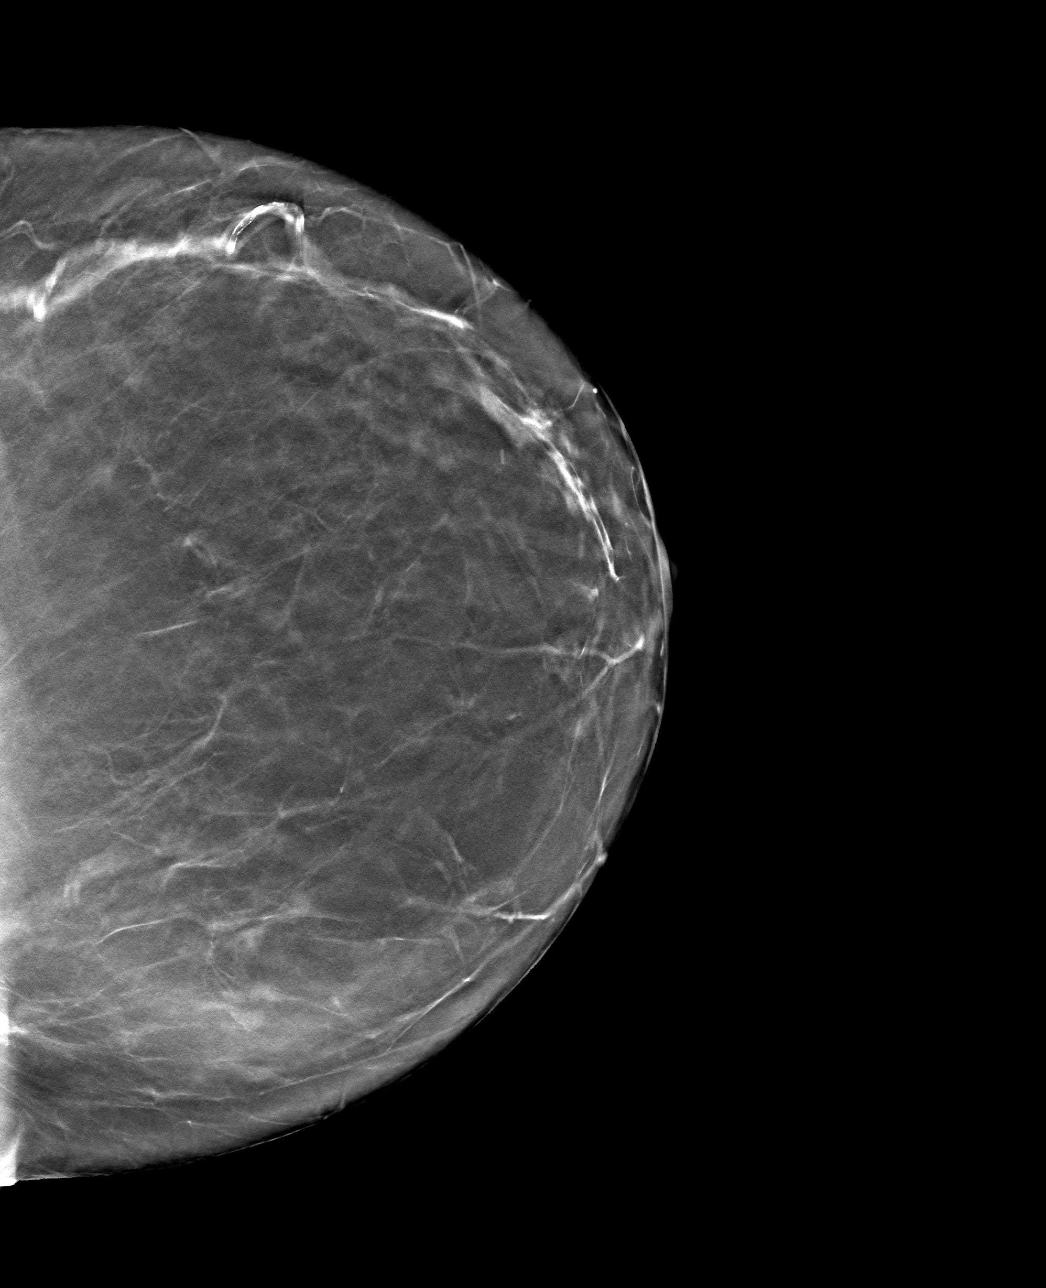

[4 of 12 positions shown; findings below may reference images not displayed]

ACR Breast Density Category b: There are scattered areas of
fibroglandular density.
FINDINGS: There are no findings suspicious for malignancy. Images were
processed with CAD.
IMPRESSION: No mammographic evidence of malignancy. A result letter of this
screening mammogram will be mailed directly to the patient.

RECOMMENDATION:
Screening mammogram in one year. (Code:[XM])

BI-RADS CATEGORY  1: Negative.

## 2019-04-19 ENCOUNTER — Telehealth: Payer: Self-pay | Admitting: Family Medicine

## 2019-04-19 NOTE — Telephone Encounter (Signed)
To my knowledge the Covid vaccine is not available locally at this time.  I think that the medical sites are trying to find ways to store it, from my understanding the vaccine requires very cold temperatures to store safely.  However, once the vaccine is available at [facility] facilities I expect quite a bit of advertisement. I am not sure that pharmacies will be carrying this any time soon, again due to the specific temperatures that are needed to store it.

## 2019-04-19 NOTE — Telephone Encounter (Signed)
Patient aware and verbalized understanding. °

## 2019-06-15 DIAGNOSIS — Z23 Encounter for immunization: Secondary | ICD-10-CM | POA: Diagnosis not present

## 2019-07-14 DIAGNOSIS — Z23 Encounter for immunization: Secondary | ICD-10-CM | POA: Diagnosis not present

## 2019-09-06 DIAGNOSIS — D3132 Benign neoplasm of left choroid: Secondary | ICD-10-CM | POA: Diagnosis not present

## 2019-09-06 DIAGNOSIS — H43812 Vitreous degeneration, left eye: Secondary | ICD-10-CM | POA: Diagnosis not present

## 2019-10-22 ENCOUNTER — Encounter: Payer: Self-pay | Admitting: Adult Health

## 2019-10-22 ENCOUNTER — Ambulatory Visit (INDEPENDENT_AMBULATORY_CARE_PROVIDER_SITE_OTHER): Payer: Medicare Other | Admitting: Adult Health

## 2019-10-22 VITALS — BP 169/93 | HR 77 | Ht 66.0 in | Wt 175.0 lb

## 2019-10-22 DIAGNOSIS — N952 Postmenopausal atrophic vaginitis: Secondary | ICD-10-CM

## 2019-10-22 DIAGNOSIS — N898 Other specified noninflammatory disorders of vagina: Secondary | ICD-10-CM | POA: Diagnosis not present

## 2019-10-22 NOTE — Progress Notes (Addendum)
°  Subjective:     Patient ID: April Weber, female   DOB: [birth year], 74 y.o.   MRN: [id number]  HPI [name] is a 74 year old white female,single, sp hysterectomy in complaining of vaginal irritation at times, feels like tissues stick, then pull, will burn after pee hits it. PCP is Dr Lajuana Ripple.  Review of Systems +vaginal irritation Not sexually active. Reviewed past medical,surgical, social and family history. Reviewed medications and allergies.     Objective:   Physical Exam BP (!) 169/93 (BP Location: Left Arm, Patient Position: Sitting, Cuff Size: Normal)    Pulse 77    Ht 5\' 6"  (1.676 m)    Wt 175 lb (79.4 kg)    BMI 28.25 kg/m Skin warm and dry. Lungs: clear to ausculation bilaterally. Cardiovascular: regular rate and rhythm.Pelvic: external genitalia is normal in appearance no lesions, vagina: pale with loss of moisture and rugae,urethra has no lesions or masses noted, cervix and uterus are absent, adnexa: no masses or tenderness noted. Bladder is non tender and no masses felt. AA 0 Fall risk is low PHQ 9 score is 0 Examination chaperoned by Dwyane Dee LPN    Assessment:     1. Vaginal dryness Try Luvena as needed  2. Vaginal atrophy She declines any estrogen    Plan:     Follow up prn

## 2019-10-23 ENCOUNTER — Telehealth: Payer: Self-pay | Admitting: Adult Health

## 2019-10-23 NOTE — Telephone Encounter (Signed)
Patient called stating that she seen [facility] yesterday and they discussed a medication that she could find over the counter. Patient states that she can not find the medication and would like to know what she could use in substitute for this medication. Patient does not want anyone else to contact her but Anderson Malta.

## 2019-10-24 NOTE — Telephone Encounter (Signed)
It is fine to use Replens if can't find luvena

## 2019-12-24 ENCOUNTER — Telehealth: Payer: Self-pay | Admitting: Family Medicine

## 2019-12-24 ENCOUNTER — Other Ambulatory Visit: Payer: Self-pay | Admitting: Family Medicine

## 2019-12-24 DIAGNOSIS — E890 Postprocedural hypothyroidism: Secondary | ICD-10-CM

## 2019-12-24 DIAGNOSIS — I10 Essential (primary) hypertension: Secondary | ICD-10-CM

## 2019-12-24 DIAGNOSIS — N1831 Chronic kidney disease, stage 3a: Secondary | ICD-10-CM

## 2019-12-24 NOTE — Telephone Encounter (Signed)
done

## 2019-12-24 NOTE — Telephone Encounter (Signed)
Pt would like orders put in for labs to be drawn for her appt on 02/13/20.

## 2019-12-25 NOTE — Telephone Encounter (Signed)
Patient aware.

## 2020-01-30 ENCOUNTER — Other Ambulatory Visit: Payer: Self-pay

## 2020-01-30 ENCOUNTER — Other Ambulatory Visit: Payer: Self-pay | Admitting: Family Medicine

## 2020-01-30 ENCOUNTER — Encounter: Payer: Self-pay | Admitting: Family Medicine

## 2020-01-30 ENCOUNTER — Ambulatory Visit (INDEPENDENT_AMBULATORY_CARE_PROVIDER_SITE_OTHER): Payer: Medicare Other | Admitting: Family Medicine

## 2020-01-30 VITALS — BP 135/79 | HR 76 | Temp 96.8°F | Ht 66.0 in | Wt 172.1 lb

## 2020-01-30 DIAGNOSIS — Z1231 Encounter for screening mammogram for malignant neoplasm of breast: Secondary | ICD-10-CM

## 2020-01-30 DIAGNOSIS — R109 Unspecified abdominal pain: Secondary | ICD-10-CM | POA: Diagnosis not present

## 2020-01-30 LAB — URINALYSIS, COMPLETE
Bilirubin, UA: NEGATIVE
Glucose, UA: NEGATIVE
Ketones, UA: NEGATIVE
Nitrite, UA: NEGATIVE
Specific Gravity, UA: 1.01 (ref 1.005–1.030)
Urobilinogen, Ur: 0.2 mg/dL (ref 0.2–1.0)
pH, UA: 6.5 (ref 5.0–7.5)

## 2020-01-30 LAB — MICROSCOPIC EXAMINATION
Bacteria, UA: NONE SEEN
WBC, UA: >30 /hpf — AB (ref 0–5)

## 2020-01-30 NOTE — Progress Notes (Addendum)
Subjective: CC: Abdominal pain PCP: [name], DO  [id number] April Weber is April 74 y.o. female presenting to clinic today for:  1. Abdominal pain [name] reports lower abdominal pain that feels like April mild pressure for about 7 days. April Weber reports the pressure sensation only occurs sometimes after trying to void. April Weber has had some periods of urgency. April Weber did have chills about 5 nights ago, but none since. April Weber has never had April UTI but is concerned that April Weber may now. April Weber recently returned from April long vacation and reports that April Weber did not void as April Weber typically would and "held it at times" during traveling. April Weber denies dysuria, flank pain, fever, hematuria, vaginal discharge, vaginal itching, or vaginal irritation. Denies nausea, vomiting, diarrhea, and constipation.  April Weber has been increasing fluids, drinking cranberry juice, and eating yogurt to help with symptoms.    Relevant past medical, surgical, family, and social history reviewed and updated as indicated.  Allergies and medications reviewed and updated.  Allergies  Allergen Reactions  . Codeine     REACTION: makes sick  . Doxycycline     REACTION: whelps hives  . Iodine     REACTION: breakout  . Penicillins     REACTION: whelps hives   Past Medical History:  Diagnosis Date  . Allergy    seasonal  . Breast disorder    cancer  . Chronic kidney disease    has one kidney  . Hyperlipidemia   . Hypertension   . Osteopenia   . Thyroid disease     Current Outpatient Medications:  .  amLODipine (NORVASC) 5 MG tablet, Take 1 tablet (5 mg total) by mouth daily., Disp: 90 tablet, Rfl: 3 .  aspirin 81 MG EC tablet, Take 81 mg by mouth daily.  , Disp: , Rfl:  .  atenolol (TENORMIN) 25 MG tablet, Take 1 tablet (25 mg total) by mouth daily., Disp: 90 tablet, Rfl: 3 .  Calcium 600-200 MG-UNIT per tablet, Take 1 tablet by mouth 2 (two) times daily. , Disp: , Rfl:  .  fish oil-omega-3 fatty acids 1000 MG capsule, Take 1 g by mouth daily.  Takes 1 tab 1488m, Disp: , Rfl:  .  levothyroxine (SYNTHROID) 100 MCG tablet, Take 1 tablet (100 mcg total) by mouth daily., Disp: 90 tablet, Rfl: 3 .  losartan (COZAAR) 100 MG tablet, Take 1 tablet (100 mg total) by mouth daily., Disp: 90 tablet, Rfl: 3 .  Multiple Vitamin (MULTIVITAMIN) capsule, Take 1 capsule by mouth daily.  , Disp: , Rfl:  .  simvastatin (ZOCOR) 20 MG tablet, Take 1 tablet (20 mg total) by mouth at bedtime. Cancel rx for 158m, Disp: 90 tablet, Rfl: 3 Social History   Socioeconomic History  . Marital status: Single    Spouse name: Divorced   . Number of children: 1  . Years of education: Not on file  . Highest education level: Not on file  Occupational History  . Occupation: Retired    Comment: worked at [employer]  . Smoking status: Former Smoker    Packs/day: 0.25    Types: Cigarettes    Quit date: 05/11/1967    Years since quitting: 52.7  . Smokeless tobacco: Never Used  Vaping Use  . Vaping Use: Never used  Substance and Sexual Activity  . Alcohol use: No  . Drug use: No  . Sexual activity: Not Currently    Birth control/protection: Post-menopausal, Surgical  Comment: hyst  Other Topics Concern  . Not on file  Social History Narrative   Lives alone   Social Determinants of Health   Financial Resource Strain: Low Risk   . Difficulty of Paying Living Expenses: Not hard at all  Food Insecurity: No Food Insecurity  . Worried About Charity fundraiser in the Last Year: Never true  . Ran Out of Food in the Last Year: Never true  Transportation Needs: No Transportation Needs  . Lack of Transportation (Medical): No  . Lack of Transportation (Non-Medical): No  Physical Activity: Sufficiently Active  . Days of Exercise per Week: 5 days  . Minutes of Exercise per Session: 30 min  Stress: No Stress Concern Present  . Feeling of Stress : Not at all  Social Connections: Moderately Integrated  .  Frequency of Communication with Friends and Family: More than three times April week  . Frequency of Social Gatherings with Friends and Family: More than three times April week  . Attends Religious Services: More than 4 times per year  . Active Member of Clubs or Organizations: Yes  . Attends Archivist Meetings: More than 4 times per year  . Marital Status: Divorced  Human resources officer Violence: Not At Risk  . Fear of Current or Ex-Partner: No  . Emotionally Abused: No  . Physically Abused: No  . Sexually Abused: No   Family History  Problem Relation Age of Onset  . Heart attack Mother   . Alcohol abuse Mother   . Breast cancer Maternal Aunt     Review of Systems  Cardiovascular: Negative for leg swelling.  Genitourinary: Negative for decreased urine volume.  Musculoskeletal: Negative for back pain.  Skin: Negative for rash.   see HPI.   Objective: Office vital signs reviewed. BP 135/79   Pulse 76   Temp (!) 96.8 F (36 C) (Temporal)   Ht '5\' 6"'  (1.676 m)   Wt 172 lb 2 oz (78.1 kg)   BMI 27.78 kg/m   Physical Examination:  Physical Exam Vitals and nursing note reviewed.  Constitutional:      General: April Weber is not in acute distress.    Appearance: April Weber is not ill-appearing or diaphoretic.  Cardiovascular:     Rate and Rhythm: Normal rate and regular rhythm.     Heart sounds: Normal heart sounds. No murmur heard.   Pulmonary:     Effort: Pulmonary effort is normal.     Breath sounds: Normal breath sounds.  Abdominal:     General: Bowel sounds are normal. There is no distension.     Palpations: Abdomen is soft. There is no mass.     Tenderness: There is no abdominal tenderness. There is no right CVA tenderness, left CVA tenderness, guarding or rebound. Negative signs include Murphy's sign and McBurney's sign.  Skin:    General: Skin is warm and dry.  Neurological:     General: No focal deficit present.     Mental Status: April Weber is alert and oriented to person, place,  and time.  Psychiatric:        Mood and Affect: Mood normal.        Behavior: Behavior normal.     Results for orders placed or performed in visit on 01/31/19  Thyroid Panel With TSH  Result Value Ref Range   TSH 1.510 0.450 - 4.500 uIU/mL   T4, Total 10.3 4.5 - 12.0 ug/dL   T3 Uptake Ratio 27 24 - 39 %  Free Thyroxine Index 2.8 1.2 - 4.9  CMP14+EGFR  Result Value Ref Range   Glucose 84 65 - 99 mg/dL   BUN 17 8 - 27 mg/dL   Creatinine, Ser 1.04 (H) 0.57 - 1.00 mg/dL   GFR calc non Af Amer 53 (L) >59 mL/min/1.73   GFR calc Af Amer 61 >59 mL/min/1.73   BUN/Creatinine Ratio 16 12 - 28   Sodium 139 134 - 144 mmol/L   Potassium 4.7 3.5 - 5.2 mmol/L   Chloride 100 96 - 106 mmol/L   CO2 24 20 - 29 mmol/L   Calcium 10.0 8.7 - 10.3 mg/dL   Total Protein 6.7 6.0 - 8.5 g/dL   Albumin 4.0 3.7 - 4.7 g/dL   Globulin, Total 2.7 1.5 - 4.5 g/dL   Albumin/Globulin Ratio 1.5 1.2 - 2.2   Bilirubin Total 0.5 0.0 - 1.2 mg/dL   Alkaline Phosphatase 78 39 - 117 IU/L   AST 25 0 - 40 IU/L   ALT 18 0 - 32 IU/L  Lipid panel  Result Value Ref Range   Cholesterol, Total 148 100 - 199 mg/dL   Triglycerides 96 0 - 149 mg/dL   HDL 49 >39 mg/dL   VLDL Cholesterol Cal 18 5 - 40 mg/dL   LDL Chol Calc (NIH) 81 0 - 99 mg/dL   Chol/HDL Ratio 3.0 0.0 - 4.4 ratio    Urine dipstick shows positive for RBC's and 1+for leukocytes, negative for nitrates.  Micro exam: >30 wbc per HPF, 0-2 RBC's per HPF, no bacteria.  Assessment/ Plan: 74 y.o. female   1. Abdominal pain, unspecified abdominal location No bacteria or elevated RBCs or elevated epithelial cells on micro exam of urine. Culture positive. Bactrim prescribed. No systemic symptoms at this time. No alarming signs for abdominal pain today. Continue pushing fluids, cranberry juice, and yogurt. Do not hold urine when you feel the urge to void. RTO for new or worsening symptoms. April Weber has April chronic follow up appt next week with PCP.  - Urinalysis, Complete -  Urine Culture   Orders Placed This Encounter  Procedures  . Urine Culture  . Urinalysis, Complete   No orders of the defined types were placed in this encounter.   Follow up for new or worsening symptoms, or if symptoms do not improve.   The above assessment and management plan was discussed with the patient. The patient verbalized understanding of and has agreed to the management plan. Patient is aware to call the clinic if symptoms persist or worsen. Patient is aware when to return to the clinic for April follow-up visit. Patient educated on when it is appropriate to go to the emergency department.   Marjorie Smolder, FNP-C [facility] [address] [place], [place] [address] [phone]

## 2020-01-30 NOTE — Patient Instructions (Signed)
Abdominal Pain, Adult Pain in the abdomen (abdominal pain) can be caused by many things. Often, abdominal pain is not serious and it gets better with no treatment or by being treated at home. However, sometimes abdominal pain is serious. Your health care provider will ask questions about your medical history and do a physical exam to try to determine the cause of your abdominal pain. Follow these instructions at home:  Medicines  Take over-the-counter and prescription medicines only as told by your health care provider.  Do not take a laxative unless told by your health care provider. General instructions  Watch your condition for any changes.  Drink enough fluid to keep your urine pale yellow.  Keep all follow-up visits as told by your health care provider. This is important. Contact a health care provider if:  Your abdominal pain changes or gets worse.  You are not hungry or you lose weight without trying.  You are constipated or have diarrhea for more than 2-3 days.  You have pain when you urinate or have a bowel movement.  Your abdominal pain wakes you up at night.  Your pain gets worse with meals, after eating, or with certain foods.  You are vomiting and cannot keep anything down.  You have a fever.  You have blood in your urine. Get help right away if:  Your pain does not go away as soon as your health care provider told you to expect.  You cannot stop vomiting.  Your pain is only in areas of the abdomen, such as the right side or the left lower portion of the abdomen. Pain on the right side could be caused by appendicitis.  You have bloody or black stools, or stools that look like tar.  You have severe pain, cramping, or bloating in your abdomen.  You have signs of dehydration, such as: ? Dark urine, very little urine, or no urine. ? Cracked lips. ? Dry mouth. ? Sunken eyes. ? Sleepiness. ? Weakness.  You have trouble breathing or chest  pain. Summary  Often, abdominal pain is not serious and it gets better with no treatment or by being treated at home. However, sometimes abdominal pain is serious.  Watch your condition for any changes.  Take over-the-counter and prescription medicines only as told by your health care provider.  Contact a health care provider if your abdominal pain changes or gets worse.  Get help right away if you have severe pain, cramping, or bloating in your abdomen. This information is not intended to replace advice given to you by your health care provider. Make sure you discuss any questions you have with your health care provider. Document Revised: 09/04/2018 Document Reviewed: 09/04/2018 Elsevier Patient Education  [place].

## 2020-02-02 ENCOUNTER — Other Ambulatory Visit: Payer: Self-pay | Admitting: Family Medicine

## 2020-02-02 DIAGNOSIS — N3000 Acute cystitis without hematuria: Secondary | ICD-10-CM

## 2020-02-02 LAB — URINE CULTURE

## 2020-02-02 MED ORDER — SULFAMETHOXAZOLE-TRIMETHOPRIM 800-160 MG PO TABS: 1.0000 | ORAL_TABLET | Freq: Two times a day (BID) | ORAL | 0 refills | Status: AC

## 2020-02-05 ENCOUNTER — Other Ambulatory Visit: Payer: Self-pay

## 2020-02-05 ENCOUNTER — Other Ambulatory Visit: Payer: Medicare Other

## 2020-02-05 DIAGNOSIS — E890 Postprocedural hypothyroidism: Secondary | ICD-10-CM | POA: Diagnosis not present

## 2020-02-05 DIAGNOSIS — I10 Essential (primary) hypertension: Secondary | ICD-10-CM

## 2020-02-05 DIAGNOSIS — N1831 Chronic kidney disease, stage 3a: Secondary | ICD-10-CM

## 2020-02-06 LAB — CMP14+EGFR
ALT: 19 IU/L (ref 0–32)
AST: 19 IU/L (ref 0–40)
Albumin/Globulin Ratio: 1.8 (ref 1.2–2.2)
Albumin: 4.3 g/dL (ref 3.7–4.7)
Alkaline Phosphatase: 77 IU/L (ref 44–121)
BUN/Creatinine Ratio: 12 (ref 12–28)
BUN: 19 mg/dL (ref 8–27)
Bilirubin Total: 0.3 mg/dL (ref 0.0–1.2)
CO2: 22 mmol/L (ref 20–29)
Calcium: 9.3 mg/dL (ref 8.7–10.3)
Chloride: 98 mmol/L (ref 96–106)
Creatinine, Ser: 1.53 mg/dL — ABNORMAL HIGH (ref 0.57–1.00)
GFR calc Af Amer: 38 mL/min/{1.73_m2} — ABNORMAL LOW (ref >59)
GFR calc non Af Amer: 33 mL/min/{1.73_m2} — ABNORMAL LOW (ref >59)
Globulin, Total: 2.4 g/dL (ref 1.5–4.5)
Glucose: 87 mg/dL (ref 65–99)
Potassium: 4.5 mmol/L (ref 3.5–5.2)
Sodium: 133 mmol/L — ABNORMAL LOW (ref 134–144)
Total Protein: 6.7 g/dL (ref 6.0–8.5)

## 2020-02-06 LAB — LIPID PANEL
Chol/HDL Ratio: 3.1 ratio (ref 0.0–4.4)
Cholesterol, Total: 140 mg/dL (ref 100–199)
HDL: 45 mg/dL (ref >39)
LDL Chol Calc (NIH): 70 mg/dL (ref 0–99)
Triglycerides: 144 mg/dL (ref 0–149)
VLDL Cholesterol Cal: 25 mg/dL (ref 5–40)

## 2020-02-06 LAB — THYROID PANEL WITH TSH
Free Thyroxine Index: 2.6 (ref 1.2–4.9)
T3 Uptake Ratio: 28 % (ref 24–39)
T4, Total: 9.4 ug/dL (ref 4.5–12.0)
TSH: 2.41 u[IU]/mL (ref 0.450–4.500)

## 2020-02-13 ENCOUNTER — Ambulatory Visit (INDEPENDENT_AMBULATORY_CARE_PROVIDER_SITE_OTHER): Payer: Medicare Other | Admitting: Family Medicine

## 2020-02-13 ENCOUNTER — Other Ambulatory Visit: Payer: Self-pay

## 2020-02-13 ENCOUNTER — Encounter: Payer: Self-pay | Admitting: Family Medicine

## 2020-02-13 VITALS — BP 163/91 | HR 70 | Temp 96.9°F | Ht 66.0 in | Wt 167.0 lb

## 2020-02-13 DIAGNOSIS — E7800 Pure hypercholesterolemia, unspecified: Secondary | ICD-10-CM | POA: Diagnosis not present

## 2020-02-13 DIAGNOSIS — N1831 Chronic kidney disease, stage 3a: Secondary | ICD-10-CM

## 2020-02-13 DIAGNOSIS — Z23 Encounter for immunization: Secondary | ICD-10-CM | POA: Diagnosis not present

## 2020-02-13 DIAGNOSIS — M85852 Other specified disorders of bone density and structure, left thigh: Secondary | ICD-10-CM | POA: Diagnosis not present

## 2020-02-13 DIAGNOSIS — E890 Postprocedural hypothyroidism: Secondary | ICD-10-CM

## 2020-02-13 DIAGNOSIS — I10 Essential (primary) hypertension: Secondary | ICD-10-CM | POA: Diagnosis not present

## 2020-02-13 MED ORDER — LEVOTHYROXINE SODIUM 100 MCG PO TABS: 100.0000 ug | ORAL_TABLET | Freq: Every day | ORAL | 3 refills | Status: DC

## 2020-02-13 MED ORDER — AMLODIPINE BESYLATE 5 MG PO TABS: 5.0000 mg | ORAL_TABLET | Freq: Every day | ORAL | 3 refills | Status: DC

## 2020-02-13 MED ORDER — SIMVASTATIN 20 MG PO TABS
20.0000 mg | ORAL_TABLET | Freq: Every day | ORAL | 3 refills | Status: DC
Start: 2020-02-13 — End: 2021-02-09

## 2020-02-13 MED ORDER — LOSARTAN POTASSIUM 100 MG PO TABS: 100.0000 mg | ORAL_TABLET | Freq: Every day | ORAL | 3 refills | Status: DC

## 2020-02-13 MED ORDER — ATENOLOL 25 MG PO TABS: 25.0000 mg | ORAL_TABLET | Freq: Every day | ORAL | 3 refills | Status: DC

## 2020-02-13 NOTE — Progress Notes (Signed)
Subjective: CC: f.u HTN, HLD, osteopenia PCP: [name], DO April Weber is a 74 y.o. female presenting to clinic today for:  1.  Hypertension with hyperlipidemia Patient reports compliance with Norvasc 5 mg daily, losartan 100 mg daily and atenolol 25 mg daily.  She has known CKD 3 a but most recently when she had labs done her GFR dropped quite a bit into the 3B range.  She voices concern over her kidney function and what types of foods to eat to prevent further deterioration of kidney function.  She is been trying to cut back on phosphorus rich foods, salt.  She admits that she was not eating and drinking her normal recently because she had just come back from an [place] trip.  No chest pain, shortness of breath, swelling.  Tolerating physical activity at baseline.  2. Hypothyroidism Compliant with Synthroid.  Occasional tremor but no heart palpitations, change in bowel habit or weight  3.  Osteopenia She is compliant with her calcium and a multivitamin.  She tries to stay physically active and maintain a good diet.   ROS: Per HPI  Allergies  Allergen Reactions  . Codeine     REACTION: makes sick  . Doxycycline     REACTION: whelps hives  . Iodine     REACTION: breakout  . Penicillins     REACTION: whelps hives   Past Medical History:  Diagnosis Date  . Allergy    seasonal  . Breast disorder    cancer  . Chronic kidney disease    has one kidney  . Hyperlipidemia   . Hypertension   . Osteopenia   . Thyroid disease     Current Outpatient Medications:  .  amLODipine (NORVASC) 5 MG tablet, Take 1 tablet (5 mg total) by mouth daily., Disp: 90 tablet, Rfl: 3 .  aspirin 81 MG EC tablet, Take 81 mg by mouth daily.  , Disp: , Rfl:  .  atenolol (TENORMIN) 25 MG tablet, Take 1 tablet (25 mg total) by mouth daily., Disp: 90 tablet, Rfl: 3 .  Calcium 600-200 MG-UNIT per tablet, Take 1 tablet by mouth 2 (two) times daily. , Disp: , Rfl:  .  fish oil-omega-3  fatty acids 1000 MG capsule, Take 1 g by mouth daily. Takes 1 tab 1400mg , Disp: , Rfl:  .  levothyroxine (SYNTHROID) 100 MCG tablet, Take 1 tablet (100 mcg total) by mouth daily., Disp: 90 tablet, Rfl: 3 .  losartan (COZAAR) 100 MG tablet, Take 1 tablet (100 mg total) by mouth daily., Disp: 90 tablet, Rfl: 3 .  Multiple Vitamin (MULTIVITAMIN) capsule, Take 1 capsule by mouth daily.  , Disp: , Rfl:  .  simvastatin (ZOCOR) 20 MG tablet, Take 1 tablet (20 mg total) by mouth at bedtime. Cancel rx for 10mg ., Disp: 90 tablet, Rfl: 3 Social History   Socioeconomic History  . Marital status: Single    Spouse name: Divorced   . Number of children: 1  . Years of education: Not on file  . Highest education level: Not on file  Occupational History  . Occupation: Retired    Comment: worked at [employer] in [place] Use  . Smoking status: Former Smoker    Packs/day: 0.25    Types: Cigarettes    Quit date: 05/11/1967    Years since quitting: 52.7  . Smokeless tobacco: Never Used  Vaping Use  . Vaping Use: Never used  Substance and Sexual Activity  .  Alcohol use: No  . Drug use: No  . Sexual activity: Not Currently    Birth control/protection: Post-menopausal, Surgical    Comment: hyst  Other Topics Concern  . Not on file  Social History Narrative   Lives alone   Social Determinants of Health   Financial Resource Strain: Low Risk   . Difficulty of Paying Living Expenses: Not hard at all  Food Insecurity: No Food Insecurity  . Worried About Charity fundraiser in the Last Year: Never true  . Ran Out of Food in the Last Year: Never true  Transportation Needs: No Transportation Needs  . Lack of Transportation (Medical): No  . Lack of Transportation (Non-Medical): No  Physical Activity: Sufficiently Active  . Days of Exercise per Week: 5 days  . Minutes of Exercise per Session: 30 min  Stress: No Stress Concern Present  . Feeling of Stress : Not at  all  Social Connections: Moderately Integrated  . Frequency of Communication with Friends and Family: More than three times a week  . Frequency of Social Gatherings with Friends and Family: More than three times a week  . Attends Religious Services: More than 4 times per year  . Active Member of Clubs or Organizations: Yes  . Attends Archivist Meetings: More than 4 times per year  . Marital Status: Divorced  Human resources officer Violence: Not At Risk  . Fear of Current or Ex-Partner: No  . Emotionally Abused: No  . Physically Abused: No  . Sexually Abused: No   Family History  Problem Relation Age of Onset  . Heart attack Mother   . Alcohol abuse Mother   . Breast cancer Maternal Aunt     Objective: Office vital signs reviewed. BP (!) 163/91   Pulse 70   Temp (!) 96.9 F (36.1 C) (Temporal)   Ht 5\' 6"  (1.676 m)   Wt 167 lb (75.8 kg)   SpO2 100%   BMI 26.95 kg/m   Physical Examination:  General: Awake, alert, well nourished, No acute distress HEENT: Normal, sclera white, MMM; well-healed postsurgical scar at the base of the neck.  No exophthalmos.  No carotid bruits Cardio: regular rate and rhythm, S1S2 heard, no murmurs appreciated Pulm: clear to auscultation bilaterally, no wheezes, rhonchi or rales; normal work of breathing on room air Extremities: warm, well perfused, No edema, cyanosis or clubbing; +2 pulses bilaterally   Assessment/ Plan: 74 y.o. female   Essential hypertension - Plan: amLODipine (NORVASC) 5 MG tablet, atenolol (TENORMIN) 25 MG tablet, losartan (COZAAR) 100 MG tablet  Stage 3a chronic kidney disease ([place]) - Plan: Renal Function Panel, Amb ref to Medical Nutrition Therapy-MNT  Postoperative hypothyroidism - Plan: levothyroxine (SYNTHROID) 100 MCG tablet  Pure hypercholesterolemia  Osteopenia of neck of left femur - Plan: DG [facility] DEXA  Need for immunization against influenza - Plan: Flu Vaccine QUAD High Dose(Fluad)  Patient has been  having excellent blood pressures at home with low systolics of 657 and high systolics of 846 and diastolics of 96E.  No medication changes were made today because of this is a do think she has a component of whitecoat hypertension.  Because of the decline in her renal function recently (albeit likely due to urinary tract infection) I have ordered a repeat renal function test and we will contact the patient with these results once available.  Encourage adequate hydration, avoidance of salt.  Not entirely sure that she has to be super strict about her diet with  regards to meats, etc., as long as her kidney function goes back to normal range. We discussed the importance of ARB, blood pressure control and good balance diet.  We reviewed her labs in detail today.  A copy was provided to her today.  We will call and mail a copy of today's labs out to her as soon as they are available.  Medications have been renewed.  Influenza vaccine administered today   No orders of the defined types were placed in this encounter.  No orders of the defined types were placed in this encounter.    [name], DO [place] [phone]

## 2020-02-13 NOTE — Patient Instructions (Signed)
You had labs performed today.  You will be contacted with the results of the labs once they are available, usually in the next 3 business days for routine lab work.  If you have an active my chart account, they will be released to your MyChart.  If you prefer to have these labs released to you via telephone, please let us know.  If you had a pap smear or biopsy performed, expect to be contacted in about 7-10 days.   Chronic Kidney Disease, Adult Chronic kidney disease (CKD) happens when the kidneys are damaged over a long period of time. The kidneys are two organs that help with:  Getting rid of waste and extra fluid from the blood.  Making hormones that maintain the amount of fluid in your tissues and blood vessels.  Making sure that the body has the right amount of fluids and chemicals. Most of the time, CKD does not go away, but it can usually be controlled. Steps must be taken to slow down the kidney damage or to stop it from getting worse. If this is not done, the kidneys may stop working. Follow these instructions at home: Medicines  Take over-the-counter and prescription medicines only as told by your doctor. You may need to change the amount of medicines you take.  Do not take any new medicines unless your doctor says it is okay. Many medicines can make your kidney damage worse.  Do not take any vitamin and supplements unless your doctor says it is okay. Many vitamins and supplements can make your kidney damage worse. General instructions  Follow a diet as told by your doctor. You may need to stay away from: ? Alcohol. ? Salty foods. ? Foods that are high in:  Potassium.  Calcium.  Protein.  Do not use any products that contain nicotine or tobacco, such as cigarettes and e-cigarettes. If you need help quitting, ask your doctor.  Keep track of your blood pressure at home. Tell your doctor about any changes.  If you have diabetes, keep track of your blood sugar as told by  your doctor.  Try to stay at a healthy weight. If you need help, ask your doctor.  Exercise at least 30 minutes a day, 5 days a week.  Stay up-to-date with your shots (immunizations) as told by your doctor.  Keep all follow-up visits as told by your doctor. This is important. Contact a doctor if:  Your symptoms get worse.  You have new symptoms. Get help right away if:  You have symptoms of end-stage kidney disease. These may include: ? Headaches. ? Numbness in your hands or feet. ? Easy bruising. ? Having hiccups often. ? Chest pain. ? Shortness of breath. ? Stopping of menstrual periods in women.  You have a fever.  You have very little pee (urine).  You have pain or bleeding when you pee. Summary  Chronic kidney disease (CKD) happens when the kidneys are damaged over a long period of time.  Most of the time, this condition does not go away, but it can usually be controlled. Steps must be taken to slow down the kidney damage or to stop it from getting worse.  Treatment may include a combination of medicines and lifestyle changes. This information is not intended to replace advice given to you by your health care provider. Make sure you discuss any questions you have with your health care provider. Document Revised: 04/08/2017 Document Reviewed: 05/31/2016 Elsevier Patient Education  2020 [employer].

## 2020-02-14 LAB — RENAL FUNCTION PANEL
Albumin: 4.5 g/dL (ref 3.7–4.7)
BUN/Creatinine Ratio: 16 (ref 12–28)
BUN: 19 mg/dL (ref 8–27)
CO2: 23 mmol/L (ref 20–29)
Calcium: 9.3 mg/dL (ref 8.7–10.3)
Chloride: 98 mmol/L (ref 96–106)
Creatinine, Ser: 1.18 mg/dL — ABNORMAL HIGH (ref 0.57–1.00)
GFR calc Af Amer: 52 mL/min/{1.73_m2} — ABNORMAL LOW (ref >59)
GFR calc non Af Amer: 45 mL/min/{1.73_m2} — ABNORMAL LOW (ref >59)
Glucose: 97 mg/dL (ref 65–99)
Phosphorus: 3.6 mg/dL (ref 3.0–4.3)
Potassium: 4.9 mmol/L (ref 3.5–5.2)
Sodium: 133 mmol/L — ABNORMAL LOW (ref 134–144)

## 2020-02-18 ENCOUNTER — Telehealth: Payer: Self-pay

## 2020-02-18 NOTE — Telephone Encounter (Signed)
Scheduled re check for 05/21/19 at 8:15am. Pt aware.

## 2020-02-18 NOTE — Telephone Encounter (Signed)
Ok to recheck in 3 months.

## 2020-02-18 NOTE — Telephone Encounter (Signed)
Patient would like to know when Dr. Darnell Level would like for her to have her kidney function tested again?

## 2020-02-29 ENCOUNTER — Other Ambulatory Visit: Payer: Self-pay

## 2020-02-29 ENCOUNTER — Ambulatory Visit
Admission: RE | Admit: 2020-02-29 | Discharge: 2020-02-29 | Disposition: A | Discharge status: Home / Self Care | Payer: Medicare Other | Source: Ambulatory Visit | Attending: Family Medicine | Admitting: Family Medicine

## 2020-02-29 DIAGNOSIS — Z1231 Encounter for screening mammogram for malignant neoplasm of breast: Secondary | ICD-10-CM

## 2020-02-29 IMAGING — MG DIGITAL SCREENING UNILAT LEFT W/ TOMO W/ CAD
4 series · 4 of 12 positions shown · non-contrast
Comparison: Previous exam(s).

CLINICAL DATA: Screening.

EXAM:
DIGITAL SCREENING UNILATERAL LEFT MAMMOGRAM WITH CAD AND TOMO

[L MLO synth-2D]
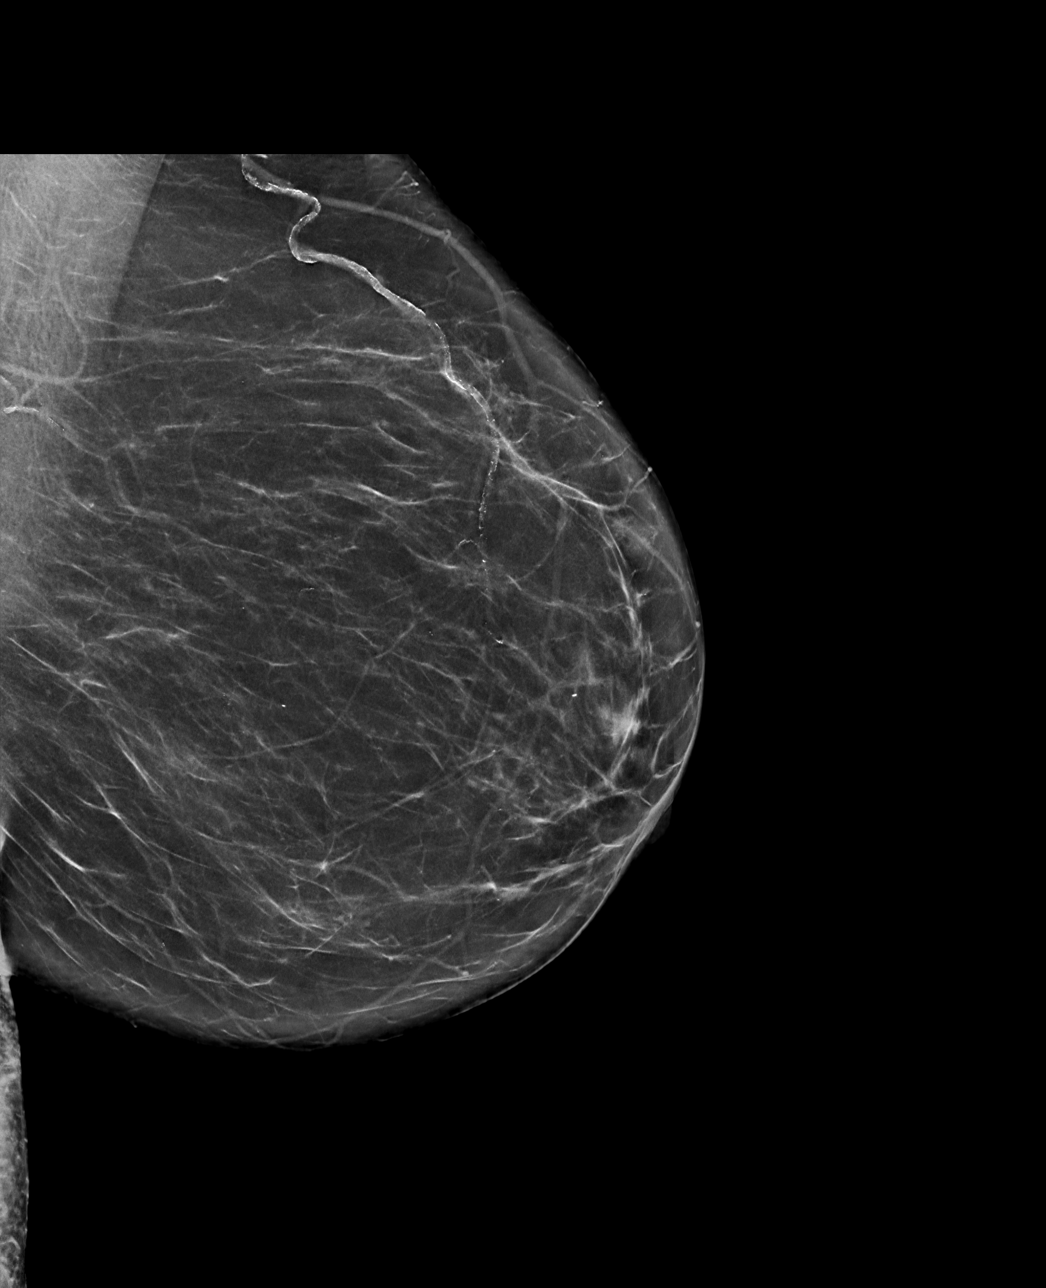

[L CC synth-2D]
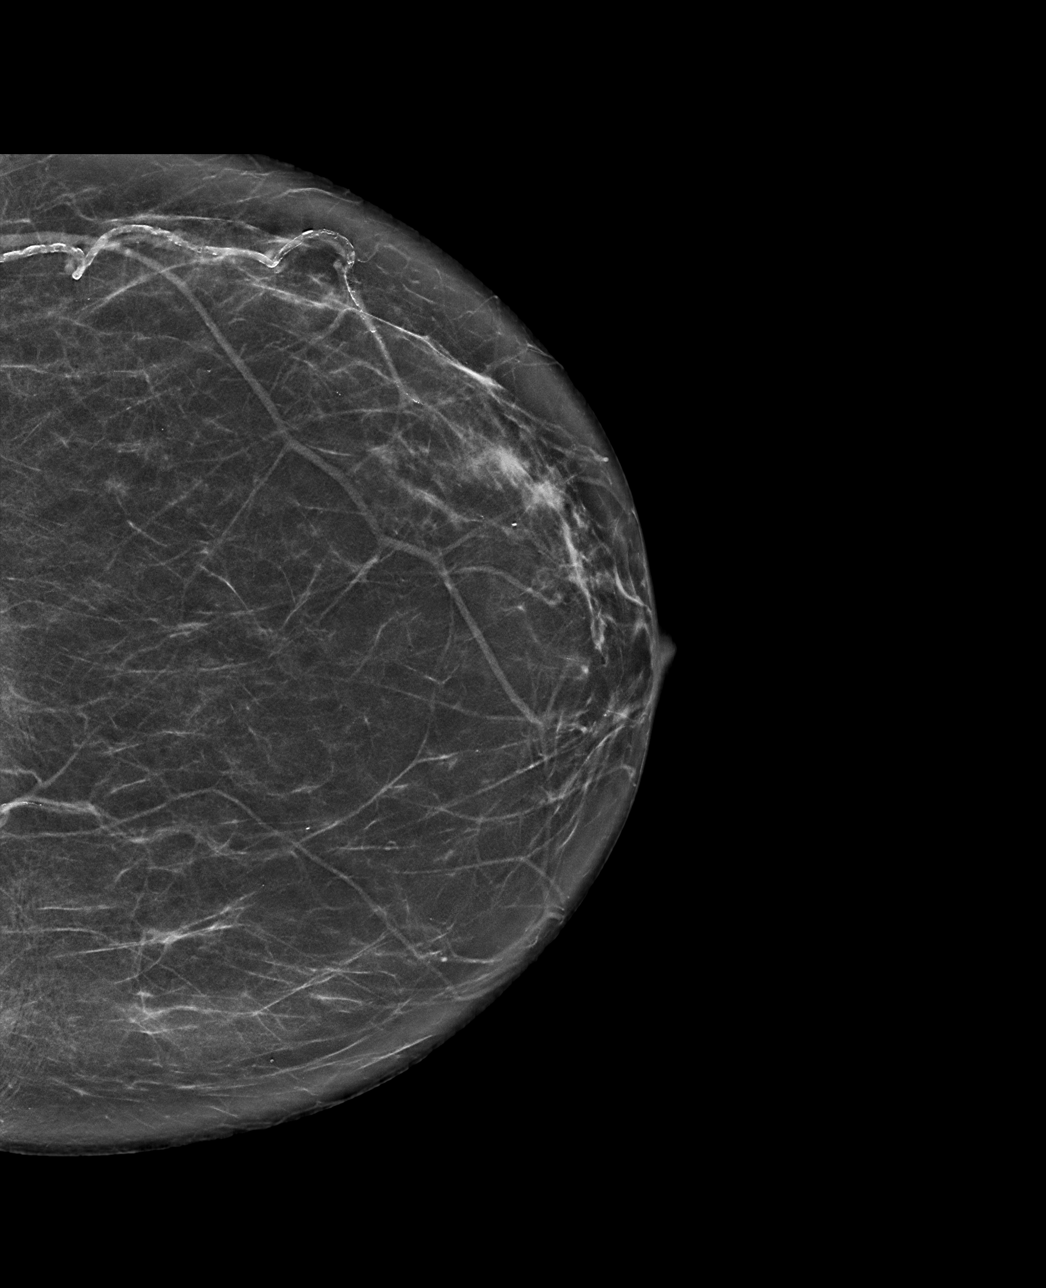

[L CC tomo · tomo slice 35/68.0]
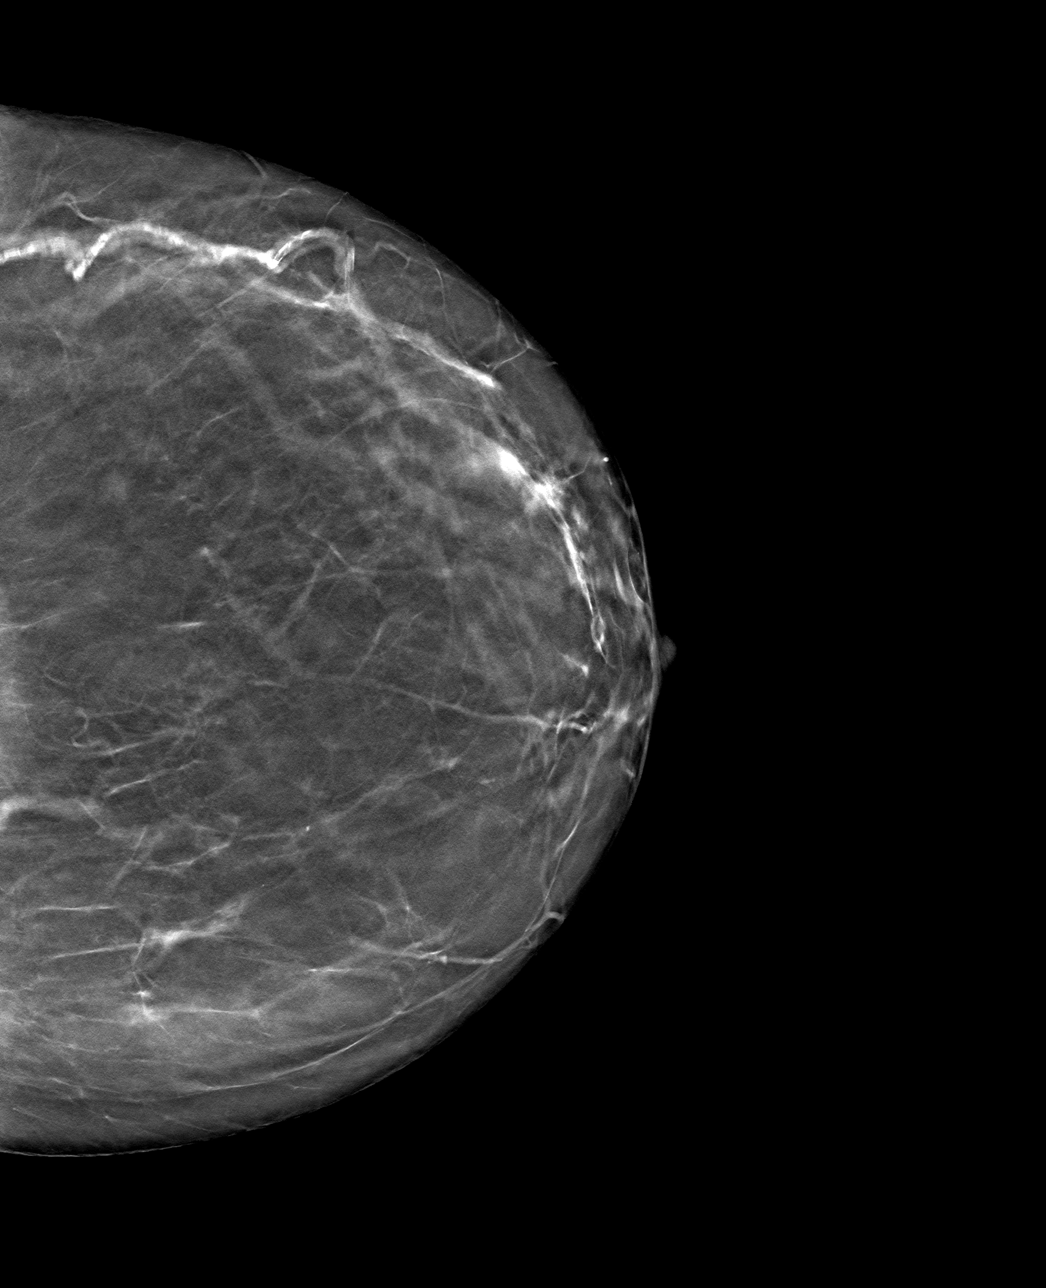

[L MLO tomo · tomo slice 40/79.0]
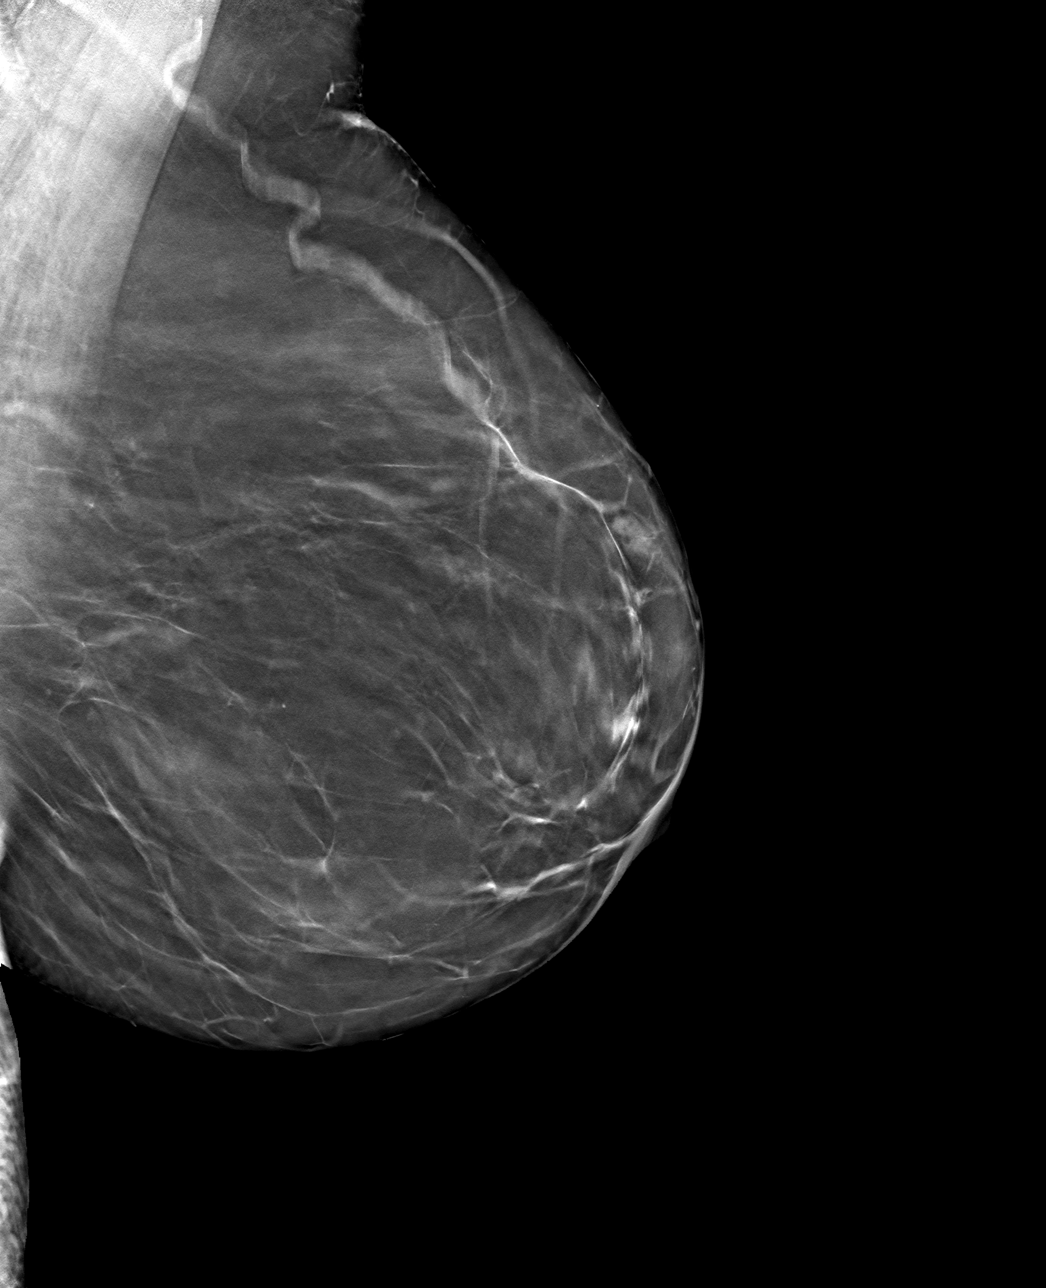

[4 of 12 positions shown; findings below may reference images not displayed]

ACR Breast Density Category b: There are scattered areas of
fibroglandular density.
FINDINGS: The patient has had a right mastectomy. There are no findings
suspicious for malignancy.

Images were processed with CAD.
IMPRESSION: No mammographic evidence of malignancy. A result letter of this
screening mammogram will be mailed directly to the patient.

RECOMMENDATION:
Screening mammogram in one year.  (Code:[S9])

BI-RADS CATEGORY  1: Negative.

## 2020-03-05 ENCOUNTER — Other Ambulatory Visit: Payer: Self-pay

## 2020-03-05 ENCOUNTER — Ambulatory Visit (INDEPENDENT_AMBULATORY_CARE_PROVIDER_SITE_OTHER): Payer: Medicare Other

## 2020-03-05 DIAGNOSIS — M8588 Other specified disorders of bone density and structure, other site: Secondary | ICD-10-CM

## 2020-03-05 DIAGNOSIS — M85852 Other specified disorders of bone density and structure, left thigh: Secondary | ICD-10-CM | POA: Diagnosis not present

## 2020-03-07 ENCOUNTER — Other Ambulatory Visit: Payer: Self-pay

## 2020-03-07 ENCOUNTER — Ambulatory Visit (INDEPENDENT_AMBULATORY_CARE_PROVIDER_SITE_OTHER): Payer: Medicare Other | Admitting: Pharmacist

## 2020-03-07 DIAGNOSIS — M8589 Other specified disorders of bone density and structure, multiple sites: Secondary | ICD-10-CM | POA: Diagnosis not present

## 2020-03-07 NOTE — Progress Notes (Signed)
      03/07/2020 Name: April Weber MRN: [id number] DOB: [birth year]   S:  37 yoF Presents for osteopenia/osteoporosis evaluation, education, and management  Current Height: Height: 5' 6.5" (168.9 cm)       Max Lifetime Height:  5\' 7"  Current Weight:   164 lbs      Ethnicity:Caucasian  BP:   163/91     (02/13/2020)    HPI: Does pt already have a diagnosis of:  Osteopenia?  Yes Osteoporosis?  No  Back Pain?  Yes       Kyphosis?  No Prior fracture?  No Med(s) for Osteoporosis/Osteopenia:  Calcium/vitD Med(s) previously tried for Osteoporosis/Osteopenia:  n/a                                                             PMH: HRT? No Steroid Use?  No Thyroid med? yes History of cancer?  Yes breast History of digestive disorders (ie Crohn's)?  No Current or previous eating disorders?  No Last Vitamin D Result:  52 (01/23/2014) Last GFR Result:  45 (02/13/20)   FH/SH: Family history of osteoporosis?  No Parent with history of hip fracture?  No Exercise?  Yes walks daily Smoking?  No    Calcium Assessment Calcium Intake  # of servings/day  Calcium mg  Milk (8 oz) 1  x  300  = 300  Yogurt (4 oz) 0 x  200 = 0  Cheese (1 oz) 0 x  200 = 0  Other Calcium sources   250mg   Ca supplement 2 = 1250   Estimated calcium intake per day 1800    DEXA Results Assessment: ASSESSMENT: Patient's diagnostic category is LOW BONE MASS/OSTEOPENIA by [facility] Iv Criteria.  FRACTURE RISK: INCREASED  FRAX: Based on the [place] model, the 10 year probability of a major osteoporotic fracture is 18%  (consider medication if >/= 20%) .The 10 year probability of a hip fracture is 3.7%. (consider medication if >/= 3%)  COMPARISON: Prior exams, most recent dated 02/17/2018 and baseline dated 12/20/2007. Since the baseline study there has been a 5.5% decrease in density for the total mean proximal femurs with no statistically significant change in bone density for the  lumbar spine.  Recommendations: 1.  Will hold on Fosamax for now, we would like see repeat GFR/kidney labs--patient scheduled in January.  Patient is also scheduled to see dietician regarding renal diet. 2.  continue calcium 1200mg  daily through supplementation or diet.  3.  continue weight bearing exercise - 30 minutes at least 4 days per week.   4.  Counseled and educated about fall risk and prevention.  Recheck DEXA:  2 years  Time spent counseling patient:  30 minutes  Regina Eck, PharmD, BCPS Clinical Pharmacist, [place]  [facility] Phone [phone]

## 2020-03-11 DIAGNOSIS — Z23 Encounter for immunization: Secondary | ICD-10-CM | POA: Diagnosis not present

## 2020-03-13 ENCOUNTER — Other Ambulatory Visit: Payer: Self-pay

## 2020-03-13 ENCOUNTER — Encounter: Payer: Medicare Other | Attending: Family Medicine | Admitting: Nutrition

## 2020-03-13 VITALS — Ht 66.0 in | Wt 170.0 lb

## 2020-03-13 DIAGNOSIS — N1831 Chronic kidney disease, stage 3a: Secondary | ICD-10-CM | POA: Insufficient documentation

## 2020-03-13 NOTE — Progress Notes (Signed)
Medical Nutrition Therapy:  Appt start time: 1300 end time:  1400.  Assessment:  Primary concerns today: CKD Stg 3. PCP: Dr. Lajuana Ripple.  She has been trying to work on cutting out foods with potassium, phosphorus and wondering if she should be taking her calcium supplements for osteopenia. She has been reading a lot on the internet and not sure what to follow. Creatine has improved from Sept 2021 to October 2021. [place] when her levels were the hightest. She  is fairy active and not sedentary.  Feels she is pretty healtsick and dehydrated when her Creatine and eGFR were the worst. She has since been trying to eat healthy foods. She has been working on drinking some water but not as much as she needs. Potassium levels are wnl and haven't been high. No phosphorous level to evaluate. Hasn't seen a CKD MD yet. Trying to work on improving kidney function on her own with diet and exerise. Her levels have improved since she has returned from vacation and drinking more water for better hydration.  CMP Latest Ref Rng & Units 02/13/2020 02/05/2020 01/31/2019  Glucose 65 - 99 mg/dL 97 87 84  BUN 8 - 27 mg/dL '19 19 17  ' Creatinine 0.57 - 1.00 mg/dL 1.18(H) 1.53(H) 1.04(H)  Sodium 134 - 144 mmol/L 133(L) 133(L) 139  Potassium 3.5 - 5.2 mmol/L 4.9 4.5 4.7  Chloride 96 - 106 mmol/L 98 98 100  CO2 20 - 29 mmol/L '23 22 24  ' Calcium 8.7 - 10.3 mg/dL 9.3 9.3 10.0  Total Protein 6.0 - 8.5 g/dL - 6.7 6.7  Total Bilirubin 0.0 - 1.2 mg/dL - 0.3 0.5  Alkaline Phos 44 - 121 IU/L - 77 78  AST 0 - 40 IU/L - 19 25  ALT 0 - 32 IU/L - 19 18  Results for Arth, Jonita A "PAT" (MRN [id number]) as of 03/18/2020 11:00  Ref. Range 02/13/2020 10:38  GFR, Est Non African American Latest Ref Range: >59 mL/min/1.73 45 (L)  GFR, Est African American Latest Ref Range: >59 mL/min/1.73 52 (L)   Preferred Learning Style:  No preference indicated   Learning Readiness:    Ready  Change in progress   MEDICATIONS:   DIETARY INTAKE  24-hr recall:  Eats 2-3 meals per day. Has been trying to avoid foods high in potassium  Usual physical activity: walks   Estimated energy needs: 1800-2200  calories 190-220 g carbohydrates 60 g protein(.8 g/kg) 60 g fat  Progress Towards Goal(s):  In progress.   Nutritional Diagnosis: Food and nutrition knowledge related to chronic kidney disease as evidenced by elevated g GRF of 43 mg/dl.    Intervention:  Diet of CKD stg 3b. Reading food labels, cooking from scratch, avoiding processed high sodium foods. Watching potassium and phosphorus high content foods. Drinking water, exercise.   Goals  Continue to read food labels for sodium and potassium content Limit foods high in potassium or phosphorous. Drink water only . Avoid sodas, tea and juices. Avoid foods with more than 200 mg of sodium per serving. Limit sodium intake to less than 2000 mg a day. Limit potassium intake to less than 3000 mg a day. Use herbs and spices to cook with and avoid salt or salt substitutes. Keep exercising.   Teaching Method Utilized:  Visual Auditory Hands on  Handouts given during visit include:  Nutrition Therapy for CKD STG 3-5  Low Sodium handout   Reading food labels  Barriers to learning/adherence to lifestyle change: none  Demonstrated degree of understanding via:  Teach Back   Monitoring/Evaluation:  Dietary intake, exercise, and body weight in 3 month(s).

## 2020-03-13 NOTE — Patient Instructions (Addendum)
Goals  Continue to read food labels for sodium and potassium content Limit foods high in potassium or phosphorous. Drink water only . Avoid sodas, tea and juices. Avoid foods with more than 200 mg of sodium per serving. Use herbs and spices to cook with and avoid salt or salt substitutes. Keep exercising.

## 2020-03-18 ENCOUNTER — Other Ambulatory Visit: Payer: Self-pay

## 2020-03-18 DIAGNOSIS — N1831 Chronic kidney disease, stage 3a: Secondary | ICD-10-CM

## 2020-03-19 ENCOUNTER — Encounter: Payer: Self-pay | Admitting: Nutrition

## 2020-04-08 DIAGNOSIS — I83892 Varicose veins of left lower extremities with other complications: Secondary | ICD-10-CM | POA: Diagnosis not present

## 2020-04-08 DIAGNOSIS — I8312 Varicose veins of left lower extremity with inflammation: Secondary | ICD-10-CM | POA: Diagnosis not present

## 2020-04-08 DIAGNOSIS — I8311 Varicose veins of right lower extremity with inflammation: Secondary | ICD-10-CM | POA: Diagnosis not present

## 2020-04-17 DIAGNOSIS — I8311 Varicose veins of right lower extremity with inflammation: Secondary | ICD-10-CM | POA: Diagnosis not present

## 2020-04-17 DIAGNOSIS — I8312 Varicose veins of left lower extremity with inflammation: Secondary | ICD-10-CM | POA: Diagnosis not present

## 2020-04-17 DIAGNOSIS — I83813 Varicose veins of bilateral lower extremities with pain: Secondary | ICD-10-CM | POA: Diagnosis not present

## 2020-04-22 DIAGNOSIS — I8312 Varicose veins of left lower extremity with inflammation: Secondary | ICD-10-CM | POA: Diagnosis not present

## 2020-04-22 DIAGNOSIS — I8311 Varicose veins of right lower extremity with inflammation: Secondary | ICD-10-CM | POA: Diagnosis not present

## 2020-04-22 DIAGNOSIS — I83813 Varicose veins of bilateral lower extremities with pain: Secondary | ICD-10-CM | POA: Diagnosis not present

## 2020-04-24 ENCOUNTER — Telehealth: Payer: Self-pay

## 2020-04-24 NOTE — Telephone Encounter (Signed)
Patient states she is getting ready to be put on Eliquis and wanted to know if the medication will affect her kidney's? States that if so she would like to have lab work before getting started on medication. Aware you are out of the office today.

## 2020-04-25 NOTE — Telephone Encounter (Signed)
Patient aware and verbalizes understanding - states that she will just wait until her appointment to get all her lab work like she had planned

## 2020-04-25 NOTE — Telephone Encounter (Signed)
She is welcome to come in for a BMP (please place order).  However, this medication is indicated in kidney disease.  It should not worsen kidneys in other words.

## 2020-05-14 DIAGNOSIS — I83811 Varicose veins of right lower extremities with pain: Secondary | ICD-10-CM | POA: Diagnosis not present

## 2020-05-14 DIAGNOSIS — I8311 Varicose veins of right lower extremity with inflammation: Secondary | ICD-10-CM | POA: Diagnosis not present

## 2020-05-20 ENCOUNTER — Ambulatory Visit (INDEPENDENT_AMBULATORY_CARE_PROVIDER_SITE_OTHER): Payer: Medicare Other | Admitting: Family Medicine

## 2020-05-20 ENCOUNTER — Other Ambulatory Visit: Payer: Self-pay

## 2020-05-20 VITALS — BP 157/88 | HR 76 | Temp 96.9°F | Ht 66.0 in | Wt 168.0 lb

## 2020-05-20 DIAGNOSIS — M85852 Other specified disorders of bone density and structure, left thigh: Secondary | ICD-10-CM | POA: Diagnosis not present

## 2020-05-20 DIAGNOSIS — M8580 Other specified disorders of bone density and structure, unspecified site: Secondary | ICD-10-CM

## 2020-05-20 DIAGNOSIS — E890 Postprocedural hypothyroidism: Secondary | ICD-10-CM

## 2020-05-20 DIAGNOSIS — I10 Essential (primary) hypertension: Secondary | ICD-10-CM

## 2020-05-20 DIAGNOSIS — N1831 Chronic kidney disease, stage 3a: Secondary | ICD-10-CM | POA: Diagnosis not present

## 2020-05-20 DIAGNOSIS — E7800 Pure hypercholesterolemia, unspecified: Secondary | ICD-10-CM

## 2020-05-20 NOTE — Patient Instructions (Signed)
Fosamax pending lab results.

## 2020-05-20 NOTE — Progress Notes (Signed)
Subjective: CC: Hypothyroidism, CKD3 PCP: [name], DO FWY:OVZCHYIF A Peitz is a 75 y.o. female presenting to clinic today for:  1. Hypothyroidism patient reports compliance with her thyroid replacement medication.  No reports of heart palpitations, tremor or difficulty swallowing  2.  Hypertension with CKD 3  compliant with losartan 100 mg daily, Norvasc 5 mg daily and atenolol 25 mg.  Blood pressure is well controlled at home.  No chest pain, shortness of breath.  3. Osteopenia she has decided on Fosamax as treatment for her osteopenia with high FRAX risk.  Compliant with vitamin D and calcium   ROS: Per HPI  Allergies  Allergen Reactions  . Codeine     REACTION: makes sick  . Doxycycline     REACTION: whelps hives  . Iodine     REACTION: breakout  . Penicillins     REACTION: whelps hives   Past Medical History:  Diagnosis Date  . Allergy    seasonal  . Breast disorder    cancer  . Chronic kidney disease    has one kidney  . Hyperlipidemia   . Hypertension   . Osteopenia   . Thyroid disease     Current Outpatient Medications:  .  amLODipine (NORVASC) 5 MG tablet, Take 1 tablet (5 mg total) by mouth daily., Disp: 90 tablet, Rfl: 3 .  aspirin 81 MG EC tablet, Take 81 mg by mouth daily.  , Disp: , Rfl:  .  atenolol (TENORMIN) 25 MG tablet, Take 1 tablet (25 mg total) by mouth daily., Disp: 90 tablet, Rfl: 3 .  Calcium 600-200 MG-UNIT per tablet, Take 1 tablet by mouth 2 (two) times daily. , Disp: , Rfl:  .  fish oil-omega-3 fatty acids 1000 MG capsule, Take 1 g by mouth daily. Takes 1 tab 1400mg , Disp: , Rfl:  .  levothyroxine (SYNTHROID) 100 MCG tablet, Take 1 tablet (100 mcg total) by mouth daily., Disp: 90 tablet, Rfl: 3 .  losartan (COZAAR) 100 MG tablet, Take 1 tablet (100 mg total) by mouth daily., Disp: 90 tablet, Rfl: 3 .  Multiple Vitamin (MULTIVITAMIN) capsule, Take 1 capsule by mouth daily.  , Disp: , Rfl:  .  simvastatin (ZOCOR) 20 MG  tablet, Take 1 tablet (20 mg total) by mouth at bedtime. Cancel rx for 10mg ., Disp: 90 tablet, Rfl: 3 Social History   Socioeconomic History  . Marital status: Single    Spouse name: Divorced   . Number of children: 1  . Years of education: Not on file  . Highest education level: Not on file  Occupational History  . Occupation: Retired    Comment: worked at [employer] in [place] Use  . Smoking status: Former Smoker    Packs/day: 0.25    Types: Cigarettes    Quit date: 05/11/1967    Years since quitting: 53.0  . Smokeless tobacco: Never Used  Vaping Use  . Vaping Use: Never used  Substance and Sexual Activity  . Alcohol use: No  . Drug use: No  . Sexual activity: Not Currently    Birth control/protection: Post-menopausal, Surgical    Comment: hyst  Other Topics Concern  . Not on file  Social History Narrative   Lives alone   Social Determinants of Health   Financial Resource Strain: Low Risk   . Difficulty of Paying Living Expenses: Not hard at all  Food Insecurity: No Food Insecurity  . Worried About Charity fundraiser  in the Last Year: Never true  . Ran Out of Food in the Last Year: Never true  Transportation Needs: No Transportation Needs  . Lack of Transportation (Medical): No  . Lack of Transportation (Non-Medical): No  Physical Activity: Sufficiently Active  . Days of Exercise per Week: 5 days  . Minutes of Exercise per Session: 30 min  Stress: No Stress Concern Present  . Feeling of Stress : Not at all  Social Connections: Moderately Integrated  . Frequency of Communication with Friends and Family: More than three times a week  . Frequency of Social Gatherings with Friends and Family: More than three times a week  . Attends Religious Services: More than 4 times per year  . Active Member of Clubs or Organizations: Yes  . Attends Archivist Meetings: More than 4 times per year  . Marital Status: Divorced   Human resources officer Violence: Not At Risk  . Fear of Current or Ex-Partner: No  . Emotionally Abused: No  . Physically Abused: No  . Sexually Abused: No   Family History  Problem Relation Age of Onset  . Heart attack Mother   . Alcohol abuse Mother   . Breast cancer Maternal Aunt     Objective: Office vital signs reviewed. BP (!) 157/88   Pulse 76   Temp (!) 96.9 F (36.1 C) (Temporal)   Ht 5\' 6"  (1.676 m)   Wt 168 lb (76.2 kg)   SpO2 100%   BMI 27.12 kg/m   Physical Examination:  General: Awake, alert, well nourished, No acute distress HEENT: Normal; no exophthalmos.  No goiter Cardio: regular rate and rhythm, S1S2 heard, no murmurs appreciated Pulm: clear to auscultation bilaterally, no wheezes, rhonchi or rales; normal work of breathing on room air Extremities: warm, well perfused, No edema, cyanosis or clubbing; +2 pulses bilaterally MSK: normal gait and station  Assessment/ Plan: 75 y.o. female   Postoperative hypothyroidism - Plan: Thyroid Panel With TSH  Stage 3a chronic kidney disease ([place]) - Plan: VITAMIN D 25 Hydroxy (Vit-D Deficiency, Fractures), Renal Function Panel, CBC  Osteopenia of neck of left femur - Plan: VITAMIN D 25 Hydroxy (Vit-D Deficiency, Fractures)  Osteopenia with high risk of fracture - Plan: alendronate (FOSAMAX) 70 MG tablet  Essential hypertension  Pure hypercholesterolemia - Plan: Lipid Panel  asymptomatic from a thyroid standpoint.  Thyroid panel ordered  renal disease has been fairly stable.  She is on ARB.  Continue current regimen  Plan for Fosamax assuming that renal disease has been stable and GFR is appropriate.  Blood pressure is not at goal but this may be whitecoat.  Continue current regimen.  Would like her to have her blood pressure rechecked again in the next month  Check fasting lipid panel.  Anticipate need to continue statin  No orders of the defined types were placed in this encounter.  No orders of the  defined types were placed in this encounter.    [name], DO [place] [phone]

## 2020-05-21 LAB — LIPID PANEL
Chol/HDL Ratio: 2.9 ratio (ref 0.0–4.4)
Cholesterol, Total: 154 mg/dL (ref 100–199)
HDL: 53 mg/dL (ref >39)
LDL Chol Calc (NIH): 81 mg/dL (ref 0–99)
Triglycerides: 113 mg/dL (ref 0–149)
VLDL Cholesterol Cal: 20 mg/dL (ref 5–40)

## 2020-05-21 LAB — CBC
Hematocrit: 42.2 % (ref 34.0–46.6)
Hemoglobin: 14.2 g/dL (ref 11.1–15.9)
MCH: 30 pg (ref 26.6–33.0)
MCHC: 33.6 g/dL (ref 31.5–35.7)
MCV: 89 fL (ref 79–97)
Platelets: 356 10*3/uL (ref 150–450)
RBC: 4.73 x10E6/uL (ref 3.77–5.28)
RDW: 12.4 % (ref 11.7–15.4)
WBC: 9.9 10*3/uL (ref 3.4–10.8)

## 2020-05-21 LAB — RENAL FUNCTION PANEL
Albumin: 4.5 g/dL (ref 3.7–4.7)
BUN/Creatinine Ratio: 12 (ref 12–28)
BUN: 12 mg/dL (ref 8–27)
CO2: 23 mmol/L (ref 20–29)
Calcium: 9.7 mg/dL (ref 8.7–10.3)
Chloride: 97 mmol/L (ref 96–106)
Creatinine, Ser: 1.03 mg/dL — ABNORMAL HIGH (ref 0.57–1.00)
GFR calc Af Amer: 61 mL/min/{1.73_m2} (ref >59)
GFR calc non Af Amer: 53 mL/min/{1.73_m2} — ABNORMAL LOW (ref >59)
Glucose: 96 mg/dL (ref 65–99)
Phosphorus: 3.6 mg/dL (ref 3.0–4.3)
Potassium: 4.7 mmol/L (ref 3.5–5.2)
Sodium: 133 mmol/L — ABNORMAL LOW (ref 134–144)

## 2020-05-21 LAB — THYROID PANEL WITH TSH
Free Thyroxine Index: 2.8 (ref 1.2–4.9)
T3 Uptake Ratio: 26 % (ref 24–39)
T4, Total: 10.8 ug/dL (ref 4.5–12.0)
TSH: 1.58 u[IU]/mL (ref 0.450–4.500)

## 2020-05-21 LAB — VITAMIN D 25 HYDROXY (VIT D DEFICIENCY, FRACTURES): Vit D, 25-Hydroxy: 56.7 ng/mL (ref 30.0–100.0)

## 2020-05-22 MED ORDER — ALENDRONATE SODIUM 70 MG PO TABS: 70.0000 mg | ORAL_TABLET | ORAL | 11 refills | Status: DC

## 2020-05-28 DIAGNOSIS — I8311 Varicose veins of right lower extremity with inflammation: Secondary | ICD-10-CM | POA: Diagnosis not present

## 2020-05-28 DIAGNOSIS — I83811 Varicose veins of right lower extremities with pain: Secondary | ICD-10-CM | POA: Diagnosis not present

## 2020-05-28 DIAGNOSIS — M7981 Nontraumatic hematoma of soft tissue: Secondary | ICD-10-CM | POA: Diagnosis not present

## 2020-06-11 DIAGNOSIS — I83811 Varicose veins of right lower extremities with pain: Secondary | ICD-10-CM | POA: Diagnosis not present

## 2020-06-11 DIAGNOSIS — I8311 Varicose veins of right lower extremity with inflammation: Secondary | ICD-10-CM | POA: Diagnosis not present

## 2020-06-11 DIAGNOSIS — M7981 Nontraumatic hematoma of soft tissue: Secondary | ICD-10-CM | POA: Diagnosis not present

## 2020-06-20 ENCOUNTER — Encounter: Payer: Self-pay | Admitting: Gastroenterology

## 2020-06-25 DIAGNOSIS — I83892 Varicose veins of left lower extremities with other complications: Secondary | ICD-10-CM | POA: Diagnosis not present

## 2020-06-25 DIAGNOSIS — I83812 Varicose veins of left lower extremities with pain: Secondary | ICD-10-CM | POA: Diagnosis not present

## 2020-06-25 DIAGNOSIS — I8312 Varicose veins of left lower extremity with inflammation: Secondary | ICD-10-CM | POA: Diagnosis not present

## 2020-07-09 DIAGNOSIS — M7981 Nontraumatic hematoma of soft tissue: Secondary | ICD-10-CM | POA: Diagnosis not present

## 2020-07-09 DIAGNOSIS — I8312 Varicose veins of left lower extremity with inflammation: Secondary | ICD-10-CM | POA: Diagnosis not present

## 2020-07-09 DIAGNOSIS — I83812 Varicose veins of left lower extremities with pain: Secondary | ICD-10-CM | POA: Diagnosis not present

## 2020-07-21 DIAGNOSIS — H43812 Vitreous degeneration, left eye: Secondary | ICD-10-CM | POA: Diagnosis not present

## 2020-07-21 DIAGNOSIS — H524 Presbyopia: Secondary | ICD-10-CM | POA: Diagnosis not present

## 2020-07-21 DIAGNOSIS — H5213 Myopia, bilateral: Secondary | ICD-10-CM | POA: Diagnosis not present

## 2020-07-21 DIAGNOSIS — D3132 Benign neoplasm of left choroid: Secondary | ICD-10-CM | POA: Diagnosis not present

## 2020-07-21 DIAGNOSIS — H2513 Age-related nuclear cataract, bilateral: Secondary | ICD-10-CM | POA: Diagnosis not present

## 2020-07-21 DIAGNOSIS — H52223 Regular astigmatism, bilateral: Secondary | ICD-10-CM | POA: Diagnosis not present

## 2020-07-23 DIAGNOSIS — I83812 Varicose veins of left lower extremities with pain: Secondary | ICD-10-CM | POA: Diagnosis not present

## 2020-08-18 ENCOUNTER — Other Ambulatory Visit: Payer: Self-pay

## 2020-08-18 ENCOUNTER — Ambulatory Visit (INDEPENDENT_AMBULATORY_CARE_PROVIDER_SITE_OTHER): Payer: Medicare Other | Admitting: Family Medicine

## 2020-08-18 ENCOUNTER — Encounter: Payer: Self-pay | Admitting: Family Medicine

## 2020-08-18 VITALS — BP 120/80 | HR 61 | Temp 98.2°F | Ht 66.0 in | Wt 170.6 lb

## 2020-08-18 DIAGNOSIS — N1831 Chronic kidney disease, stage 3a: Secondary | ICD-10-CM

## 2020-08-18 DIAGNOSIS — I10 Essential (primary) hypertension: Secondary | ICD-10-CM

## 2020-08-18 DIAGNOSIS — J301 Allergic rhinitis due to pollen: Secondary | ICD-10-CM

## 2020-08-18 DIAGNOSIS — Z1211 Encounter for screening for malignant neoplasm of colon: Secondary | ICD-10-CM | POA: Diagnosis not present

## 2020-08-18 NOTE — Patient Instructions (Signed)
Bring your cuff into the office to check against our blood pressure monitor  Claritin will help with allergy Consider switching to Nasacort if ongoing sinus pressure

## 2020-08-18 NOTE — Progress Notes (Signed)
Subjective: CC: HTN w/ CKD3 PCP: [name], DO [id number]:[id number] April Weber is April 75 y.o. female presenting to clinic today for:  1. HTN w/ CKD 3 Patient reports compliance with her Norvasc, atenolol, losartan.  No chest pain, shortness of breath, edema or visual disturbance.  She is really been trying to watch her diet and increase water to improve kidney function.  Her home blood pressures have been between 938-182 systolic over 99B to 71I diastolic.  2.  Allergic rhinitis Patient reports that she has sinus pressure.  Sometimes she will get some nasal drainage which will also precipitate throat irritation.  She is used Flonase but did not find this to be very helpful.  She is wondering what sinus medications are safe given her renal disease and blood pressure issues.  No fevers, chills, hemoptysis  ROS: Per HPI  Allergies  Allergen Reactions  . Codeine     REACTION: makes sick  . Doxycycline     REACTION: whelps hives  . Iodine     REACTION: breakout  . Penicillins     REACTION: whelps hives   Past Medical History:  Diagnosis Date  . Allergy    seasonal  . Breast disorder    cancer  . Chronic kidney disease    has one kidney  . Hyperlipidemia   . Hypertension   . Osteopenia   . Thyroid disease     Current Outpatient Medications:  .  alendronate (FOSAMAX) 70 MG tablet, Take 1 tablet (70 mg total) by mouth every 7 (seven) days. Take with April full glass of water on an empty stomach., Disp: 4 tablet, Rfl: 11 .  amLODipine (NORVASC) 5 MG tablet, Take 1 tablet (5 mg total) by mouth daily., Disp: 90 tablet, Rfl: 3 .  aspirin 81 MG EC tablet, Take 81 mg by mouth daily., Disp: , Rfl:  .  atenolol (TENORMIN) 25 MG tablet, Take 1 tablet (25 mg total) by mouth daily., Disp: 90 tablet, Rfl: 3 .  Calcium 600-200 MG-UNIT per tablet, Take 1 tablet by mouth 2 (two) times daily., Disp: , Rfl:  .  fish oil-omega-3 fatty acids 1000 MG capsule, Take 1 g by mouth daily. Takes 1 tab 1400mg ,  Disp: , Rfl:  .  levothyroxine (SYNTHROID) 100 MCG tablet, Take 1 tablet (100 mcg total) by mouth daily., Disp: 90 tablet, Rfl: 3 .  losartan (COZAAR) 100 MG tablet, Take 1 tablet (100 mg total) by mouth daily., Disp: 90 tablet, Rfl: 3 .  Multiple Vitamin (MULTIVITAMIN) capsule, Take 1 capsule by mouth daily., Disp: , Rfl:  .  simvastatin (ZOCOR) 20 MG tablet, Take 1 tablet (20 mg total) by mouth at bedtime. Cancel rx for 10mg ., Disp: 90 tablet, Rfl: 3 Social History   Socioeconomic History  . Marital status: Single    Spouse name: Divorced   . Number of children: 1  . Years of education: Not on file  . Highest education level: Not on file  Occupational History  . Occupation: Retired    Comment: worked at [employer] in [place] Use  . Smoking status: Former Smoker    Packs/day: 0.25    Types: Cigarettes    Quit date: 05/11/1967    Years since quitting: 53.3  . Smokeless tobacco: Never Used  Vaping Use  . Vaping Use: Never used  Substance and Sexual Activity  . Alcohol use: No  . Drug use: No  . Sexual activity: Not Currently  Birth control/protection: Post-menopausal, Surgical    Comment: hyst  Other Topics Concern  . Not on file  Social History Narrative   Lives alone   Social Determinants of Health   Financial Resource Strain: Low Risk   . Difficulty of Paying Living Expenses: Not hard at all  Food Insecurity: No Food Insecurity  . Worried About Charity fundraiser in the Last Year: Never true  . Ran Out of Food in the Last Year: Never true  Transportation Needs: No Transportation Needs  . Lack of Transportation (Medical): No  . Lack of Transportation (Non-Medical): No  Physical Activity: Sufficiently Active  . Days of Exercise per Week: 5 days  . Minutes of Exercise per Session: 30 min  Stress: No Stress Concern Present  . Feeling of Stress : Not at all  Social Connections: Moderately Integrated  . Frequency of  Communication with Friends and Family: More than three times April week  . Frequency of Social Gatherings with Friends and Family: More than three times April week  . Attends Religious Services: More than 4 times per year  . Active Member of Clubs or Organizations: Yes  . Attends Archivist Meetings: More than 4 times per year  . Marital Status: Divorced  Human resources officer Violence: Not At Risk  . Fear of Current or Ex-Partner: No  . Emotionally Abused: No  . Physically Abused: No  . Sexually Abused: No   Family History  Problem Relation Age of Onset  . Heart attack Mother   . Alcohol abuse Mother   . Breast cancer Maternal Aunt     Objective: Office vital signs reviewed. BP 120/80 Comment: home BP  Pulse 61   Temp 98.2 F (36.8 C)   Ht 5\' 6"  (1.676 m)   Wt 170 lb 9.6 oz (77.4 kg)   SpO2 100%   BMI 27.54 kg/m   Physical Examination:  General: Awake, alert, well nourished, No acute distress HEENT: Normal; sclera white.  TMs intact.  Clear rhinorrhea.  Oropharyngeal cobblestone appearance noted Cardio: regular rate and rhythm, S1S2 heard, no murmurs appreciated Pulm: clear to auscultation bilaterally, no wheezes, rhonchi or rales; normal work of breathing on room air Extremities: warm, well perfused, No edema, cyanosis or clubbing; +2 pulses bilaterally  Assessment/ Plan: 75 y.o. female   White coat syndrome with diagnosis of hypertension  Stage 3a chronic kidney disease ([place]) - Plan: Renal Function Panel  Seasonal allergic rhinitis due to pollen  Screen for colon cancer - Plan: Cologuard  I suspect the elevation in blood pressure here is due to whitecoat syndrome.  Of asked that she bring her blood pressure monitor in to check against ours to ensure that she in fact is having normal measurements at home.  We will check April renal function panel.  With regards to her allergic rhinitis, we discussed using nasal saline spray versus oral antihistamine like Claritin.  No  evidence of infection on today's exam.  Patient is average risk for colon cancer.  She wishes for cologuard.  Orders Placed This Encounter  Procedures  . Renal Function Panel  . Cologuard   No orders of the defined types were placed in this encounter.    [name], DO [place] [phone]

## 2020-08-19 DIAGNOSIS — Z23 Encounter for immunization: Secondary | ICD-10-CM | POA: Diagnosis not present

## 2020-08-19 LAB — RENAL FUNCTION PANEL
Albumin: 4.5 g/dL (ref 3.7–4.7)
BUN/Creatinine Ratio: 14 (ref 12–28)
BUN: 14 mg/dL (ref 8–27)
CO2: 22 mmol/L (ref 20–29)
Calcium: 9.8 mg/dL (ref 8.7–10.3)
Chloride: 99 mmol/L (ref 96–106)
Creatinine, Ser: 1.03 mg/dL — ABNORMAL HIGH (ref 0.57–1.00)
Glucose: 97 mg/dL (ref 65–99)
Phosphorus: 3.8 mg/dL (ref 3.0–4.3)
Potassium: 5.1 mmol/L (ref 3.5–5.2)
Sodium: 137 mmol/L (ref 134–144)
eGFR: 57 mL/min/{1.73_m2} — ABNORMAL LOW (ref >59)

## 2020-08-21 ENCOUNTER — Other Ambulatory Visit: Payer: Self-pay

## 2020-08-21 ENCOUNTER — Ambulatory Visit: Payer: Medicare Other | Admitting: *Deleted

## 2020-08-21 DIAGNOSIS — I10 Essential (primary) hypertension: Secondary | ICD-10-CM

## 2020-08-21 NOTE — Progress Notes (Signed)
Pt comes in for a BP check with her machine With ours on the left arm ist reading 172/87, then 168/95 With hers it was 168/95 then 166/90

## 2020-08-25 ENCOUNTER — Ambulatory Visit (INDEPENDENT_AMBULATORY_CARE_PROVIDER_SITE_OTHER): Payer: Medicare Other

## 2020-08-25 ENCOUNTER — Telehealth: Payer: Self-pay

## 2020-08-25 VITALS — Ht 66.0 in | Wt 170.0 lb

## 2020-08-25 DIAGNOSIS — Z0000 Encounter for general adult medical examination without abnormal findings: Secondary | ICD-10-CM | POA: Diagnosis not present

## 2020-08-25 NOTE — Progress Notes (Signed)
Subjective:   April Weber is a 75 y.o. female who presents for Medicare Annual (Subsequent) preventive examination.  Virtual Visit via Telephone Note  I connected with  April Weber on 08/25/20 at  3:30 PM EDT by telephone and verified that I am speaking with the correct person using two identifiers.  Location: Patient: Home Provider: [facility] Persons participating in the virtual visit: patient/Nurse Health Advisor   I discussed the limitations, risks, security and privacy concerns of performing an evaluation and management service by telephone and the availability of in person appointments. The patient expressed understanding and agreed to proceed.  Interactive audio and video telecommunications were attempted between this nurse and patient, however failed, due to patient having technical difficulties OR patient did not have access to video capability.  We continued and completed visit with audio only.  Some vital signs may be absent or patient reported.   April Weber E April Gandolfo, LPN   Review of Systems     Cardiac Risk Factors include: advanced age (>54men, >33 women);dyslipidemia     Objective:    Today's Vitals   08/25/20 1521  Weight: 170 lb (77.1 kg)  Height: 5\' 6"  (1.676 m)   Body mass index is 27.44 kg/m.  Advanced Directives 08/25/2020 02/24/2016 08/14/2014 08/14/2014  Does Patient Have a Medical Advance Directive? Yes Yes - Yes  Type of Advance Directive [place];Living will - - [place];Living will  Does patient want to make changes to medical advance directive? - - - No - Patient declined  Copy of [place] in Chart? No - copy requested - No - copy requested Yes    Current Medications (verified) Outpatient Encounter Medications as of 08/25/2020  Medication Sig  . alendronate (FOSAMAX) 70 MG tablet Take 1 tablet (70 mg total) by mouth every 7 (seven) days. Take with a full glass of water on an empty stomach.   Marland Kitchen amLODipine (NORVASC) 5 MG tablet Take 1 tablet (5 mg total) by mouth daily.  Marland Kitchen aspirin 81 MG EC tablet Take 81 mg by mouth daily.  Marland Kitchen atenolol (TENORMIN) 25 MG tablet Take 1 tablet (25 mg total) by mouth daily.  . Calcium 600-200 MG-UNIT per tablet Take 1 tablet by mouth 2 (two) times daily.  . fish oil-omega-3 fatty acids 1000 MG capsule Take 1 g by mouth daily. Takes 1 tab 1400mg   . levothyroxine (SYNTHROID) 100 MCG tablet Take 1 tablet (100 mcg total) by mouth daily.  Marland Kitchen losartan (COZAAR) 100 MG tablet Take 1 tablet (100 mg total) by mouth daily.  . Multiple Vitamin (MULTIVITAMIN) capsule Take 1 capsule by mouth daily.  . simvastatin (ZOCOR) 20 MG tablet Take 1 tablet (20 mg total) by mouth at bedtime. Cancel rx for 10mg .   No facility-administered encounter medications on file as of 08/25/2020.    Allergies (verified) Codeine, Doxycycline, Iodine, and Penicillins   History: Past Medical History:  Diagnosis Date  . Allergy    seasonal  . Breast disorder    cancer  . Chronic kidney disease    has one kidney  . Hyperlipidemia   . Hypertension   . Osteopenia   . Thyroid disease    Past Surgical History:  Procedure Laterality Date  . ABDOMINAL HYSTERECTOMY    . APPENDECTOMY    . BREAST BIOPSY    . EYE SURGERY     75 years old  . MASTECTOMY Right 1984  . ROTATOR CUFF REPAIR    .  THYROID SURGERY    . VARICOSE VEIN SURGERY     ablation   Family History  Problem Relation Age of Onset  . Heart attack Mother   . Alcohol abuse Mother   . Breast cancer Maternal Aunt    Social History   Socioeconomic History  . Marital status: Single    Spouse name: Divorced   . Number of children: 1  . Years of education: Not on file  . Highest education level: Not on file  Occupational History  . Occupation: Retired    Comment: worked at [employer] in [place] Use  . Smoking status: Former Smoker    Packs/day: 0.25    Types: Cigarettes     Quit date: 05/11/1967    Years since quitting: 53.3  . Smokeless tobacco: Never Used  Vaping Use  . Vaping Use: Never used  Substance and Sexual Activity  . Alcohol use: No  . Drug use: No  . Sexual activity: Not Currently    Birth control/protection: Post-menopausal, Surgical    Comment: hyst  Other Topics Concern  . Not on file  Social History Narrative   Lives alone - very close with her neighbors   Social Determinants of Health   Financial Resource Strain: Low Risk   . Difficulty of Paying Living Expenses: Not hard at all  Food Insecurity: No Food Insecurity  . Worried About Charity fundraiser in the Last Year: Never true  . Ran Out of Food in the Last Year: Never true  Transportation Needs: No Transportation Needs  . Lack of Transportation (Medical): No  . Lack of Transportation (Non-Medical): No  Physical Activity: Sufficiently Active  . Days of Exercise per Week: 7 days  . Minutes of Exercise per Session: 40 min  Stress: No Stress Concern Present  . Feeling of Stress : Not at all  Social Connections: Moderately Integrated  . Frequency of Communication with Friends and Family: More than three times a week  . Frequency of Social Gatherings with Friends and Family: More than three times a week  . Attends Religious Services: More than 4 times per year  . Active Member of Clubs or Organizations: Yes  . Attends Archivist Meetings: More than 4 times per year  . Marital Status: Divorced    Tobacco Counseling Counseling given: No   Clinical Intake:  Pre-visit preparation completed: Yes  Pain : No/denies pain     BMI - recorded: 27.44 Nutritional Status: BMI 25 -29 Overweight Nutritional Risks: None Diabetes: No  How often do you need to have someone help you when you read instructions, pamphlets, or other written materials from your doctor or pharmacy?: 1 - Never  Diabetic? Ni  Interpreter Needed?: No  Information entered by :: April Bonebrake,  LPN   Activities of Daily Living In your present state of health, do you have any difficulty performing the following activities: 08/25/2020  Hearing? N  Vision? N  Difficulty concentrating or making decisions? N  Walking or climbing stairs? N  Dressing or bathing? N  Doing errands, shopping? N  Preparing Food and eating ? N  Using the Toilet? N  In the past six months, have you accidently leaked urine? N  Do you have problems with loss of bowel control? N  Managing your Medications? N  Managing your Finances? N  Housekeeping or managing your Housekeeping? N  Some recent data might be hidden    Patient Care Team: Lajuana Ripple,  Koleen Distance, DO as PCP - General (Family Medicine) Vania Rea, MD as Attending Physician (Obstetrics and Gynecology) Inda Castle, MD (Inactive) as Attending Physician (Gastroenterology) Garrel Ridgel, [place] as Consulting Physician (Podiatry) Linus Mako, MD as Consulting Physician (Family Medicine)  Indicate any recent Medical Services you may have received from other than [facility] providers in the past year (date may be approximate).     Assessment:   This is a routine wellness examination for April Weber.  Hearing/Vision screen  Hearing Screening   125Hz  250Hz  500Hz  1000Hz  2000Hz  3000Hz  4000Hz  6000Hz  8000Hz   Right ear:           Left ear:           Comments: Denies hearing difficulties  Vision Screening Comments: Annual visits with [facility] - up to date with eye exams - wears glasses.  Dietary issues and exercise activities discussed: Current Exercise Habits: Home exercise routine, Type of exercise: walking, Time (Minutes): 45, Frequency (Times/Week): 7, Weekly Exercise (Minutes/Week): 315, Intensity: Mild  Goals    . Patient Stated     Would like to stay healthy and active Goals Addressed             This Visit's Progress   . Patient Stated       Would like to stay healthy and active             Depression Screen PHQ 2/9 Scores  08/25/2020 08/18/2020 05/20/2020 02/13/2020 01/30/2020 10/22/2019 02/09/2019  PHQ - 2 Score 0 0 0 0 0 0 0  PHQ- 9 Score - - 0 0 - 0 0    Fall Risk Fall Risk  08/25/2020 08/18/2020 05/20/2020 02/13/2020 01/30/2020  Falls in the past year? 0 0 0 0 0  Comment - - - - -  Number falls in past yr: 0 - - - -  Comment - - - - -  Injury with Fall? 0 - - - -  Comment - - - - -  Risk for fall due to : No Fall Risks - - - -  Follow up Falls prevention discussed - - - -    FALL RISK PREVENTION PERTAINING TO THE HOME:  Any stairs in or around the home? Yes  If so, are there any without handrails? No  Home free of loose throw rugs in walkways, pet beds, electrical cords, etc? Yes  Adequate lighting in your home to reduce risk of falls? Yes   ASSISTIVE DEVICES UTILIZED TO PREVENT FALLS:  Life alert? No  Use of a cane, walker or w/c? No  Grab bars in the bathroom? Yes  Shower chair or bench in shower? Yes  Elevated toilet seat or a handicapped toilet? No   TIMED UP AND GO:  Was the test performed? No . Telephonic visit.  Cognitive Function: Normal cognitive status assessed by direct observation by this Nurse Health Advisor. No abnormalities found.   MMSE - Mini Mental State Exam 08/14/2014  Orientation to time 5  Orientation to Place 5  Registration 3  Attention/ Calculation 5  Recall 3  Language- name 2 objects 2  Language- repeat 1  Language- follow 3 step command 3  Language- read & follow direction 1  Write a sentence 1  Copy design 1  Total score 30        Immunizations Immunization History  Administered Date(s) Administered  . Fluad Quad(high Dose 65+) 02/09/2019, 02/13/2020  . Influenza Whole 02/16/2010  . Influenza, High Dose Seasonal PF 02/12/2015, 03/05/2016,  03/01/2017, 02/17/2018  . Influenza,inj,Quad PF,6+ Mos 02/13/2013, 02/13/2014  . Moderna Sars-Covid-2 Vaccination 06/15/2019, 07/14/2019, 03/11/2020  . Pneumococcal Conjugate-13 08/14/2014  . Pneumococcal  Polysaccharide-23 10/24/2009  . Td 01/09/2019, 01/09/2019  . Tdap 08/28/2008  . Unspecified SARS-COV-2 Vaccination 08/19/2020  . Zoster 03/23/2007    TDAP status: Up to date  Flu Vaccine status: Up to date  Pneumococcal vaccine status: Up to date  Covid-19 vaccine status: Completed vaccines  Qualifies for Shingles Vaccine? Yes   Zostavax completed No   Shingrix Completed?: No.    Education has been provided regarding the importance of this vaccine. Patient has been advised to call insurance company to determine out of pocket expense if they have not yet received this vaccine. Advised may also receive vaccine at local pharmacy or Health Dept. Verbalized acceptance and understanding.  Screening Tests Health Maintenance  Topic Date Due  . COLONOSCOPY (Pts 45-37yrs Insurance coverage will need to be confirmed)  05/10/2020  . INFLUENZA VACCINE  12/08/2020  . MAMMOGRAM  02/28/2021  . DEXA SCAN  03/05/2022  . TETANUS/TDAP  01/08/2029  . COVID-19 Vaccine  Completed  . Hepatitis C Screening  Completed  . PNA vac Low Risk Adult  Completed  . HPV VACCINES  Aged Out    Health Maintenance  Health Maintenance Due  Topic Date Due  . COLONOSCOPY (Pts 45-50yrs Insurance coverage will need to be confirmed)  05/10/2020    Colorectal cancer screening: Type of screening: Colonoscopy. Completed 2012. Repeat every 10 years - She has cologuard at home now and will return soon.  Mammogram status: Completed 02/29/2020. Repeat every year  Bone Density status: Completed 03/05/2020. Results reflect: Bone density results: OSTEOPENIA. Repeat every 2 years.  Lung Cancer Screening: (Low Dose CT Chest recommended if Age 26-80 years, 30 pack-year currently smoking OR have quit w/in 15years.) does not qualify.   Additional Screening:  Hepatitis C Screening: does qualify; Completed 02/13/2018  Vision Screening: Recommended annual ophthalmology exams for early detection of glaucoma and other disorders  of the eye. Is the patient up to date with their annual eye exam?  Yes  Who is the provider or what is the name of the office in which the patient attends annual eye exams? [facility] in [place] If pt is not established with a provider, would they like to be referred to a provider to establish care? No .   Dental Screening: Recommended annual dental exams for proper oral hygiene  Community Resource Referral / Chronic Care Management: CRR required this visit?  No   CCM required this visit?  No      Plan:     I have personally reviewed and noted the following in the patient's chart:   . Medical and social history . Use of alcohol, tobacco or illicit drugs  . Current medications and supplements . Functional ability and status . Nutritional status . Physical activity . Advanced directives . List of other physicians . Hospitalizations, surgeries, and ER visits in previous 12 months . Vitals . Screenings to include cognitive, depression, and falls . Referrals and appointments  In addition, I have reviewed and discussed with patient certain preventive protocols, quality metrics, and best practice recommendations. A written personalized care plan for preventive services as well as general preventive health recommendations were provided to patient.     Sandrea Hammond, LPN   6/72/0947   Nurse Notes: She is due for shingrix, and has a cologuard at home that she will return soon.

## 2020-08-25 NOTE — Patient Instructions (Signed)
April Weber , Thank you for taking time to come for your Medicare Wellness Visit. I appreciate your ongoing commitment to your health goals. Please review the following plan we discussed and let me know if I can assist you in the future.   Screening recommendations/referrals: Colonoscopy: Done 05/2010 - Return cologuard soon Mammogram: Done 02/29/2020 - Repeat annually Bone Density: Done 03/05/2020 - Repeat every 2 years Recommended yearly ophthalmology/optometry visit for glaucoma screening and checkup Recommended yearly dental visit for hygiene and checkup  Vaccinations: Influenza vaccine: Done 10//2021 - Repeat annually Pneumococcal vaccine: Done 10/24/2009 & 08/14/2014 Tdap vaccine: Done 01/09/2019 - Repeat every 10 years Shingles vaccine: Shingrix discussed. Please contact your pharmacy or insurance for coverage information.    Covid-19: Done 06/15/19, 07/14/19, 03/11/20 & 08/19/20  Advanced directives: Please bring a copy of your health care power of attorney and living will to the office to be added to your chart at your convenience.  Conditions/risks identified: Keep up the great work!  Next appointment: Follow up in one year for your annual wellness visit    Preventive Care 65 Years and Older, Female Preventive care refers to lifestyle choices and visits with your health care provider that can promote health and wellness. What does preventive care include?  A yearly physical exam. This is also called an annual well check.  Dental exams once or twice a year.  Routine eye exams. Ask your health care provider how often you should have your eyes checked.  Personal lifestyle choices, including:  Daily care of your teeth and gums.  Regular physical activity.  Eating a healthy diet.  Avoiding tobacco and drug use.  Limiting alcohol use.  Practicing safe sex.  Taking low-dose aspirin every day.  Taking vitamin and mineral supplements as recommended by your health care  provider. What happens during an annual well check? The services and screenings done by your health care provider during your annual well check will depend on your age, overall health, lifestyle risk factors, and family history of disease. Counseling  Your health care provider may ask you questions about your:  Alcohol use.  Tobacco use.  Drug use.  Emotional well-being.  Home and relationship well-being.  Sexual activity.  Eating habits.  History of falls.  Memory and ability to understand (cognition).  Work and work Statistician.  Reproductive health. Screening  You may have the following tests or measurements:  Height, weight, and BMI.  Blood pressure.  Lipid and cholesterol levels. These may be checked every 5 years, or more frequently if you are over 57 years old.  Skin check.  Lung cancer screening. You may have this screening every year starting at age 5 if you have a 30-pack-year history of smoking and currently smoke or have quit within the past 15 years.  Fecal occult blood test (FOBT) of the stool. You may have this test every year starting at age 57.  Flexible sigmoidoscopy or colonoscopy. You may have a sigmoidoscopy every 5 years or a colonoscopy every 10 years starting at age 60.  Hepatitis C blood test.  Hepatitis B blood test.  Sexually transmitted disease (STD) testing.  Diabetes screening. This is done by checking your blood sugar (glucose) after you have not eaten for a while (fasting). You may have this done every 1-3 years.  Bone density scan. This is done to screen for osteoporosis. You may have this done starting at age 57.  Mammogram. This may be done every 1-2 years. Talk to your health  care provider about how often you should have regular mammograms. Talk with your health care provider about your test results, treatment options, and if necessary, the need for more tests. Vaccines  Your health care provider may recommend certain  vaccines, such as:  Influenza vaccine. This is recommended every year.  Tetanus, diphtheria, and acellular pertussis (Tdap, Td) vaccine. You may need a Td booster every 10 years.  Zoster vaccine. You may need this after age 70.  Pneumococcal 13-valent conjugate (PCV13) vaccine. One dose is recommended after age 51.  Pneumococcal polysaccharide (PPSV23) vaccine. One dose is recommended after age 14. Talk to your health care provider about which screenings and vaccines you need and how often you need them. This information is not intended to replace advice given to you by your health care provider. Make sure you discuss any questions you have with your health care provider. Document Released: 05/23/2015 Document Revised: 01/14/2016 Document Reviewed: 02/25/2015 Elsevier Interactive Patient Education  2017 [place] Prevention in the Home Falls can cause injuries. They can happen to people of all ages. There are many things you can do to make your home safe and to help prevent falls. What can I do on the outside of my home?  Regularly fix the edges of walkways and driveways and fix any cracks.  Remove anything that might make you trip as you walk through a door, such as a raised step or threshold.  Trim any bushes or trees on the path to your home.  Use bright outdoor lighting.  Clear any walking paths of anything that might make someone trip, such as rocks or tools.  Regularly check to see if handrails are loose or broken. Make sure that both sides of any steps have handrails.  Any raised decks and porches should have guardrails on the edges.  Have any leaves, snow, or ice cleared regularly.  Use sand or salt on walking paths during winter.  Clean up any spills in your garage right away. This includes oil or grease spills. What can I do in the bathroom?  Use night lights.  Install grab bars by the toilet and in the tub and shower. Do not use towel bars as grab  bars.  Use non-skid mats or decals in the tub or shower.  If you need to sit down in the shower, use a plastic, non-slip stool.  Keep the floor dry. Clean up any water that spills on the floor as soon as it happens.  Remove soap buildup in the tub or shower regularly.  Attach bath mats securely with double-sided non-slip rug tape.  Do not have throw rugs and other things on the floor that can make you trip. What can I do in the bedroom?  Use night lights.  Make sure that you have a light by your bed that is easy to reach.  Do not use any sheets or blankets that are too big for your bed. They should not hang down onto the floor.  Have a firm chair that has side arms. You can use this for support while you get dressed.  Do not have throw rugs and other things on the floor that can make you trip. What can I do in the kitchen?  Clean up any spills right away.  Avoid walking on wet floors.  Keep items that you use a lot in easy-to-reach places.  If you need to reach something above you, use a strong step stool that has a grab  bar.  Keep electrical cords out of the way.  Do not use floor polish or wax that makes floors slippery. If you must use wax, use non-skid floor wax.  Do not have throw rugs and other things on the floor that can make you trip. What can I do with my stairs?  Do not leave any items on the stairs.  Make sure that there are handrails on both sides of the stairs and use them. Fix handrails that are broken or loose. Make sure that handrails are as long as the stairways.  Check any carpeting to make sure that it is firmly attached to the stairs. Fix any carpet that is loose or worn.  Avoid having throw rugs at the top or bottom of the stairs. If you do have throw rugs, attach them to the floor with carpet tape.  Make sure that you have a light switch at the top of the stairs and the bottom of the stairs. If you do not have them, ask someone to add them for  you. What else can I do to help prevent falls?  Wear shoes that:  Do not have high heels.  Have rubber bottoms.  Are comfortable and fit you well.  Are closed at the toe. Do not wear sandals.  If you use a stepladder:  Make sure that it is fully opened. Do not climb a closed stepladder.  Make sure that both sides of the stepladder are locked into place.  Ask someone to hold it for you, if possible.  Clearly mark and make sure that you can see:  Any grab bars or handrails.  First and last steps.  Where the edge of each step is.  Use tools that help you move around (mobility aids) if they are needed. These include:  Canes.  Walkers.  Scooters.  Crutches.  Turn on the lights when you go into a dark area. Replace any light bulbs as soon as they burn out.  Set up your furniture so you have a clear path. Avoid moving your furniture around.  If any of your floors are uneven, fix them.  If there are any pets around you, be aware of where they are.  Review your medicines with your doctor. Some medicines can make you feel dizzy. This can increase your chance of falling. Ask your doctor what other things that you can do to help prevent falls. This information is not intended to replace advice given to you by your health care provider. Make sure you discuss any questions you have with your health care provider. Document Released: 02/20/2009 Document Revised: 10/02/2015 Document Reviewed: 05/31/2014 Elsevier Interactive Patient Education  2017 [employer].

## 2020-08-25 NOTE — Progress Notes (Signed)
Her home BP cuff is reliable and she has normal BPs at home.  No changes to meds needed.  Continue home BP monitoring with goal BP <140/90.  White coat hypertension  April Weber M. Lajuana Ripple, [facility]

## 2020-08-26 NOTE — Telephone Encounter (Signed)
Labs printed/mailed.

## 2020-08-26 NOTE — Progress Notes (Signed)
Pt aware of provider's recommendations.

## 2020-08-27 DIAGNOSIS — Z1211 Encounter for screening for malignant neoplasm of colon: Secondary | ICD-10-CM | POA: Diagnosis not present

## 2020-09-04 ENCOUNTER — Other Ambulatory Visit: Payer: Self-pay

## 2020-09-04 DIAGNOSIS — R195 Other fecal abnormalities: Secondary | ICD-10-CM

## 2020-09-04 DIAGNOSIS — Z1211 Encounter for screening for malignant neoplasm of colon: Secondary | ICD-10-CM

## 2020-09-04 LAB — COLOGUARD: Cologuard: POSITIVE — AB

## 2020-09-09 ENCOUNTER — Telehealth: Payer: Self-pay | Admitting: Family Medicine

## 2020-09-09 NOTE — Telephone Encounter (Signed)
Needs to know when her colonoscopy will be

## 2020-09-10 ENCOUNTER — Telehealth: Payer: Self-pay

## 2020-09-10 ENCOUNTER — Encounter: Payer: Self-pay | Admitting: Gastroenterology

## 2020-09-10 ENCOUNTER — Other Ambulatory Visit: Payer: Self-pay

## 2020-09-10 ENCOUNTER — Other Ambulatory Visit: Payer: Medicare Other

## 2020-09-10 DIAGNOSIS — R195 Other fecal abnormalities: Secondary | ICD-10-CM | POA: Diagnosis not present

## 2020-09-10 LAB — CBC WITH DIFFERENTIAL/PLATELET
Basophils Absolute: 0.1 10*3/uL (ref 0.0–0.2)
Basos: 1 %
EOS (ABSOLUTE): 0.3 10*3/uL (ref 0.0–0.4)
Eos: 3 %
Hematocrit: 40.2 % (ref 34.0–46.6)
Hemoglobin: 13.3 g/dL (ref 11.1–15.9)
Immature Grans (Abs): 0 10*3/uL (ref 0.0–0.1)
Immature Granulocytes: 0 %
Lymphocytes Absolute: 1.6 10*3/uL (ref 0.7–3.1)
Lymphs: 16 %
MCH: 29.7 pg (ref 26.6–33.0)
MCHC: 33.1 g/dL (ref 31.5–35.7)
MCV: 90 fL (ref 79–97)
Monocytes Absolute: 0.7 10*3/uL (ref 0.1–0.9)
Monocytes: 7 %
Neutrophils Absolute: 7.2 10*3/uL — ABNORMAL HIGH (ref 1.4–7.0)
Neutrophils: 73 %
Platelets: 385 10*3/uL (ref 150–450)
RBC: 4.48 x10E6/uL (ref 3.77–5.28)
RDW: 12.4 % (ref 11.7–15.4)
WBC: 10 10*3/uL (ref 3.4–10.8)

## 2020-09-10 NOTE — Telephone Encounter (Signed)
Pt had a positive cologuard--she is scheduled for a colonoscopy on 12/03/2020. She wants to know if it is ok to wait that long or should she call to get a sooner apt. Please call back

## 2020-09-10 NOTE — Telephone Encounter (Signed)
Ideally, i'd like her seen sooner but I think GI is likely backlogged.  She is welcome to come in for interval POC Hemoglobins to make sure bleed is not brisk.  Please place standing order for 3 tests.  Would space out approximately every 3 weeks.

## 2020-09-10 NOTE — Telephone Encounter (Signed)
Patient aware and will come in for labs every 3 weeks x 3 times.

## 2020-09-30 ENCOUNTER — Other Ambulatory Visit: Payer: Medicare Other

## 2020-09-30 ENCOUNTER — Other Ambulatory Visit: Payer: Self-pay

## 2020-09-30 DIAGNOSIS — R195 Other fecal abnormalities: Secondary | ICD-10-CM | POA: Diagnosis not present

## 2020-09-30 LAB — CBC WITH DIFFERENTIAL/PLATELET
Basophils Absolute: 0.1 10*3/uL (ref 0.0–0.2)
Basos: 1 %
EOS (ABSOLUTE): 0.2 10*3/uL (ref 0.0–0.4)
Eos: 3 %
Hematocrit: 41 % (ref 34.0–46.6)
Hemoglobin: 13.6 g/dL (ref 11.1–15.9)
Immature Grans (Abs): 0 10*3/uL (ref 0.0–0.1)
Immature Granulocytes: 1 %
Lymphocytes Absolute: 1.3 10*3/uL (ref 0.7–3.1)
Lymphs: 16 %
MCH: 29.5 pg (ref 26.6–33.0)
MCHC: 33.2 g/dL (ref 31.5–35.7)
MCV: 89 fL (ref 79–97)
Monocytes Absolute: 0.5 10*3/uL (ref 0.1–0.9)
Monocytes: 7 %
Neutrophils Absolute: 5.9 10*3/uL (ref 1.4–7.0)
Neutrophils: 72 %
Platelets: 342 10*3/uL (ref 150–450)
RBC: 4.61 x10E6/uL (ref 3.77–5.28)
RDW: 12.6 % (ref 11.7–15.4)
WBC: 8 10*3/uL (ref 3.4–10.8)

## 2020-10-03 ENCOUNTER — Telehealth: Payer: Self-pay | Admitting: Family Medicine

## 2020-10-03 NOTE — Telephone Encounter (Signed)
Patient aware and verbalized understanding. °

## 2020-10-22 ENCOUNTER — Other Ambulatory Visit: Payer: Self-pay

## 2020-10-22 ENCOUNTER — Other Ambulatory Visit: Payer: Medicare Other

## 2020-10-22 DIAGNOSIS — R195 Other fecal abnormalities: Secondary | ICD-10-CM

## 2020-10-22 LAB — CBC WITH DIFFERENTIAL/PLATELET
Basophils Absolute: 0.1 10*3/uL (ref 0.0–0.2)
Basos: 1 %
EOS (ABSOLUTE): 0.3 10*3/uL (ref 0.0–0.4)
Eos: 4 %
Hematocrit: 39.5 % (ref 34.0–46.6)
Hemoglobin: 13.5 g/dL (ref 11.1–15.9)
Immature Grans (Abs): 0 10*3/uL (ref 0.0–0.1)
Immature Granulocytes: 0 %
Lymphocytes Absolute: 1.3 10*3/uL (ref 0.7–3.1)
Lymphs: 18 %
MCH: 29.7 pg (ref 26.6–33.0)
MCHC: 34.2 g/dL (ref 31.5–35.7)
MCV: 87 fL (ref 79–97)
Monocytes Absolute: 0.6 10*3/uL (ref 0.1–0.9)
Monocytes: 8 %
Neutrophils Absolute: 5.2 10*3/uL (ref 1.4–7.0)
Neutrophils: 69 %
Platelets: 353 10*3/uL (ref 150–450)
RBC: 4.54 x10E6/uL (ref 3.77–5.28)
RDW: 12.2 % (ref 11.7–15.4)
WBC: 7.5 10*3/uL (ref 3.4–10.8)

## 2020-11-07 NOTE — Progress Notes (Signed)
Subjective: CC: Hypothyroidism, CKD PCP: [name], DO April Weber is a 75 y.o. female presenting to clinic today for:  1. Hypothyroidism Compliant with Synthroid.  No change in voice, difficulty swallowing, tremor, change in bowel habits.  She is had some constipation but this was alleviated by increased fiber.  She wonders if she had a bleeding hemorrhoid which may have triggered a positive Cologuard test.  See below  2. CKD3 with hypertension and hyperlipidemia Patient hydrates very well and is compliant with her blood pressure medicine.  She drinks easily over a gallon of water per day.  She avoids oral NSAIDs totally but does use topical Voltaren.  She discontinued her fish oils after her last visit would like her lipid panel rechecked.  She is fasting.  No chest pain, shortness of breath.  3. Positive Cologuard test Has appt with [facility] on 12/03/20.  She denies any visible melena or hematochezia.  She thinks that she may have had a hemorrhoid as above.  Like to have her CBC checked.  It has been stable thus far.  4.  Osteopenia with high fracture risk Compliant with Fosamax.  Would like to get a 43-month supply of Fosamax as she is having to go every month.  No reports of concerning side effects.  ROS: Per HPI  Allergies  Allergen Reactions   Codeine     REACTION: makes sick   Doxycycline     REACTION: whelps hives   Iodine     REACTION: breakout   Penicillins     REACTION: whelps hives   Past Medical History:  Diagnosis Date   Allergy    seasonal   Breast disorder    cancer   Chronic kidney disease    has one kidney   Hyperlipidemia    Hypertension    Osteopenia    Thyroid disease     Current Outpatient Medications:    alendronate (FOSAMAX) 70 MG tablet, Take 1 tablet (70 mg total) by mouth every 7 (seven) days. Take with a full glass of water on an empty stomach., Disp: 4 tablet, Rfl: 11   amLODipine (NORVASC) 5 MG tablet, Take 1 tablet (5 mg  total) by mouth daily., Disp: 90 tablet, Rfl: 3   aspirin 81 MG EC tablet, Take 81 mg by mouth daily., Disp: , Rfl:    atenolol (TENORMIN) 25 MG tablet, Take 1 tablet (25 mg total) by mouth daily., Disp: 90 tablet, Rfl: 3   Calcium 600-200 MG-UNIT per tablet, Take 1 tablet by mouth 2 (two) times daily., Disp: , Rfl:    fish oil-omega-3 fatty acids 1000 MG capsule, Take 1 g by mouth daily. Takes 1 tab 1400mg , Disp: , Rfl:    levothyroxine (SYNTHROID) 100 MCG tablet, Take 1 tablet (100 mcg total) by mouth daily., Disp: 90 tablet, Rfl: 3   losartan (COZAAR) 100 MG tablet, Take 1 tablet (100 mg total) by mouth daily., Disp: 90 tablet, Rfl: 3   Multiple Vitamin (MULTIVITAMIN) capsule, Take 1 capsule by mouth daily., Disp: , Rfl:    simvastatin (ZOCOR) 20 MG tablet, Take 1 tablet (20 mg total) by mouth at bedtime. Cancel rx for 10mg ., Disp: 90 tablet, Rfl: 3 Social History   Socioeconomic History   Marital status: Single    Spouse name: Divorced    Number of children: 1   Years of education: Not on file   Highest education level: Not on file  Occupational History   Occupation: Retired  Comment: worked at [employer] in [employer]  Tobacco Use   Smoking status: Former    Packs/day: 0.25    Pack years: 0.00    Types: Cigarettes    Quit date: 05/11/1967    Years since quitting: 53.5   Smokeless tobacco: Never  Vaping Use   Vaping Use: Never used  Substance and Sexual Activity   Alcohol use: No   Drug use: No   Sexual activity: Not Currently    Birth control/protection: Post-menopausal, Surgical    Comment: hyst  Other Topics Concern   Not on file  Social History Narrative   Lives alone - very close with her neighbors   Social Determinants of Health   Financial Resource Strain: Low Risk    Difficulty of Paying Living Expenses: Not hard at all  Food Insecurity: No Food Insecurity   Worried About Charity fundraiser in the Last Year: Never true   [place] in the Last Year: Never true  Transportation Needs: No Transportation Needs   Lack of Transportation (Medical): No   Lack of Transportation (Non-Medical): No  Physical Activity: Sufficiently Active   Days of Exercise per Week: 7 days   Minutes of Exercise per Session: 40 min  Stress: No Stress Concern Present   Feeling of Stress : Not at all  Social Connections: Moderately Integrated   Frequency of Communication with Friends and Family: More than three times a week   Frequency of Social Gatherings with Friends and Family: More than three times a week   Attends Religious Services: More than 4 times per year   Active Member of [employer] or Organizations: Yes   Attends Music therapist: More than 4 times per year   Marital Status: Divorced  Human resources officer Violence: Not At Risk   Fear of Current or Ex-Partner: No   Emotionally Abused: No   Physically Abused: No   Sexually Abused: No   Family History  Problem Relation Age of Onset   Heart attack Mother    Alcohol abuse Mother    Breast cancer Maternal Aunt     Objective: Office vital signs reviewed. BP (!) 143/75   Pulse (!) 58   Temp 97.8 F (36.6 C)   Ht 5\' 6"  (1.676 m)   Wt 172 lb 6.4 oz (78.2 kg)   SpO2 98%   BMI 27.83 kg/m   Physical Examination:  General: Awake, alert, well nourished, No acute distress HEENT: Normal    Neck: No masses palpated. No lymphadenopathy    Ears: Tympanic membranes intact, normal light reflex, no erythema, no bulging    Eyes: PERRLA, extraocular membranes intact, sclera white    Nose:  no nasal discharge    Throat: moist mucus membranes, no erythema, no tonsillar exudate.  Airway is patent Cardio: regular rate and rhythm, S1S2 heard, no murmurs appreciated Pulm: clear to auscultation bilaterally, no wheezes, rhonchi or rales; normal work of breathing on room air GI: soft, non-tender, non-distended, bowel sounds present x4, no hepatomegaly, no splenomegaly, no  masses Extremities: warm, well perfused, No edema, cyanosis or clubbing; +2 pulses bilaterally MSK: normal gait and station Skin: dry; intact; no rashes.  Many seborrheic keratoses and benign lentigo noted along the upper extremities Neuro: Patellar DTRs 2/4  Assessment/ Plan: 75 y.o. female   Stage 3a chronic kidney disease ([place]) - Plan: Renal Function Panel, CBC with Differential/Platelet  White coat syndrome with diagnosis of hypertension - Plan: losartan (COZAAR)  100 MG tablet  Essential hypertension  Postoperative hypothyroidism - Plan: TSH, T4, Free  Pure hypercholesterolemia - Plan: Lipid Panel  Osteopenia with high risk of fracture - Plan: alendronate (FOSAMAX) 70 MG tablet  Positive colorectal cancer screening using Cologuard test  Check renal function, CBC  Blood pressure was better upon manual recheck.  Her home blood pressures are very well controlled and at goal.  No changes to current regimen.  Medication has been renewed  Patient is asymptomatic from a thyroid standpoint.  Check TSH and free T4  Recheck lipid panel given discontinuation of OTC fish oils  Continue Fosamax.  74-month supply has been sent  Patient has appropriate follow-up with GI for follow-up on positive result with Cologuard testing.  No orders of the defined types were placed in this encounter.  No orders of the defined types were placed in this encounter.    [name], DO [place] [phone]

## 2020-11-17 ENCOUNTER — Ambulatory Visit (INDEPENDENT_AMBULATORY_CARE_PROVIDER_SITE_OTHER): Payer: Medicare Other | Admitting: Family Medicine

## 2020-11-17 ENCOUNTER — Encounter: Payer: Self-pay | Admitting: Family Medicine

## 2020-11-17 ENCOUNTER — Other Ambulatory Visit: Payer: Self-pay

## 2020-11-17 VITALS — BP 143/75 | HR 58 | Temp 97.8°F | Ht 66.0 in | Wt 172.4 lb

## 2020-11-17 DIAGNOSIS — M8580 Other specified disorders of bone density and structure, unspecified site: Secondary | ICD-10-CM

## 2020-11-17 DIAGNOSIS — R195 Other fecal abnormalities: Secondary | ICD-10-CM | POA: Diagnosis not present

## 2020-11-17 DIAGNOSIS — E890 Postprocedural hypothyroidism: Secondary | ICD-10-CM

## 2020-11-17 DIAGNOSIS — E7800 Pure hypercholesterolemia, unspecified: Secondary | ICD-10-CM

## 2020-11-17 DIAGNOSIS — I10 Essential (primary) hypertension: Secondary | ICD-10-CM

## 2020-11-17 DIAGNOSIS — N1831 Chronic kidney disease, stage 3a: Secondary | ICD-10-CM

## 2020-11-17 MED ORDER — LOSARTAN POTASSIUM 100 MG PO TABS
100.0000 mg | ORAL_TABLET | Freq: Every day | ORAL | 3 refills | Status: DC
Start: 2020-11-17 — End: 2021-05-19

## 2020-11-17 MED ORDER — ALENDRONATE SODIUM 70 MG PO TABS: 70.0000 mg | ORAL_TABLET | ORAL | 3 refills | Status: DC

## 2020-11-17 NOTE — Patient Instructions (Signed)

## 2020-11-18 ENCOUNTER — Telehealth: Payer: Self-pay | Admitting: Family Medicine

## 2020-11-18 LAB — CBC WITH DIFFERENTIAL/PLATELET
Basophils Absolute: 0.1 10*3/uL (ref 0.0–0.2)
Basos: 1 %
EOS (ABSOLUTE): 0.3 10*3/uL (ref 0.0–0.4)
Eos: 3 %
Hematocrit: 42.3 % (ref 34.0–46.6)
Hemoglobin: 14.2 g/dL (ref 11.1–15.9)
Immature Grans (Abs): 0 10*3/uL (ref 0.0–0.1)
Immature Granulocytes: 0 %
Lymphocytes Absolute: 1.3 10*3/uL (ref 0.7–3.1)
Lymphs: 15 %
MCH: 29.6 pg (ref 26.6–33.0)
MCHC: 33.6 g/dL (ref 31.5–35.7)
MCV: 88 fL (ref 79–97)
Monocytes Absolute: 0.6 10*3/uL (ref 0.1–0.9)
Monocytes: 7 %
Neutrophils Absolute: 6.5 10*3/uL (ref 1.4–7.0)
Neutrophils: 74 %
Platelets: 338 10*3/uL (ref 150–450)
RBC: 4.79 x10E6/uL (ref 3.77–5.28)
RDW: 12.4 % (ref 11.7–15.4)
WBC: 8.9 10*3/uL (ref 3.4–10.8)

## 2020-11-18 LAB — T4, FREE: Free T4: 1.85 ng/dL — ABNORMAL HIGH (ref 0.82–1.77)

## 2020-11-18 LAB — LIPID PANEL
Chol/HDL Ratio: 3.2 ratio (ref 0.0–4.4)
Cholesterol, Total: 174 mg/dL (ref 100–199)
HDL: 54 mg/dL (ref >39)
LDL Chol Calc (NIH): 94 mg/dL (ref 0–99)
Triglycerides: 149 mg/dL (ref 0–149)
VLDL Cholesterol Cal: 26 mg/dL (ref 5–40)

## 2020-11-18 LAB — RENAL FUNCTION PANEL
Albumin: 4.6 g/dL (ref 3.7–4.7)
BUN/Creatinine Ratio: 14 (ref 12–28)
BUN: 15 mg/dL (ref 8–27)
CO2: 22 mmol/L (ref 20–29)
Calcium: 9.7 mg/dL (ref 8.7–10.3)
Chloride: 98 mmol/L (ref 96–106)
Creatinine, Ser: 1.05 mg/dL — ABNORMAL HIGH (ref 0.57–1.00)
Glucose: 93 mg/dL (ref 65–99)
Phosphorus: 4 mg/dL (ref 3.0–4.3)
Potassium: 5 mmol/L (ref 3.5–5.2)
Sodium: 135 mmol/L (ref 134–144)
eGFR: 55 mL/min/{1.73_m2} — ABNORMAL LOW (ref >59)

## 2020-11-18 LAB — TSH: TSH: 2.02 u[IU]/mL (ref 0.450–4.500)

## 2020-11-18 NOTE — Telephone Encounter (Signed)
[facility] said they got refill but it was too early, not able to refill till the 19th

## 2020-11-19 ENCOUNTER — Ambulatory Visit (AMBULATORY_SURGERY_CENTER): Payer: Medicare Other

## 2020-11-19 ENCOUNTER — Other Ambulatory Visit: Payer: Self-pay

## 2020-11-19 VITALS — Ht 66.0 in | Wt 171.0 lb

## 2020-11-19 DIAGNOSIS — R195 Other fecal abnormalities: Secondary | ICD-10-CM

## 2020-11-19 NOTE — Progress Notes (Signed)
No egg or soy allergy known to patient  No issues with past sedation with any surgeries or procedures, other than PONV Patient denies ever being told they had issues or difficulty with intubation --other than PONV No FH of Malignant Hyperthermia No diet pills per patient No home 02 use per patient  No blood thinners per patient  Pt reports issues with constipation - will advise patient to increase fluids, use stool softener, or Miralax to assist with constipation; prune juice as well;  No A fib or A flutter   COVID 19 guidelines implemented in [place] today with Pt and RN  Pt is fully vaccinated for Covid x 2 + boosters  NO PA's for preps discussed with pt in PV today  Discussed with pt there will be an out-of-pocket cost for prep and that varies from $0 to 70 dollars   Due to the COVID-19 pandemic we are asking patients to follow certain guidelines.  Pt aware of COVID protocols and [facility] guidelines

## 2020-12-03 ENCOUNTER — Encounter: Payer: Self-pay | Admitting: Gastroenterology

## 2020-12-03 ENCOUNTER — Other Ambulatory Visit: Payer: Self-pay

## 2020-12-03 ENCOUNTER — Ambulatory Visit (AMBULATORY_SURGERY_CENTER): Payer: Medicare Other | Admitting: Gastroenterology

## 2020-12-03 VITALS — BP 156/66 | HR 64 | Temp 97.1°F | Resp 19 | Ht 66.0 in | Wt 171.0 lb

## 2020-12-03 DIAGNOSIS — Z1211 Encounter for screening for malignant neoplasm of colon: Secondary | ICD-10-CM | POA: Diagnosis not present

## 2020-12-03 DIAGNOSIS — Z538 Procedure and treatment not carried out for other reasons: Secondary | ICD-10-CM | POA: Diagnosis not present

## 2020-12-03 DIAGNOSIS — R195 Other fecal abnormalities: Secondary | ICD-10-CM

## 2020-12-03 MED ORDER — SODIUM CHLORIDE 0.9 % IV SOLN
500.0000 mL | Freq: Once | INTRAVENOUS | Status: DC
Start: 2020-12-03 — End: 2020-12-03

## 2020-12-03 NOTE — Progress Notes (Signed)
To PACU, VSS. Report to [email or web address] 

## 2020-12-03 NOTE — Op Note (Addendum)
Progress Patient Name: April Weber Procedure Date: 12/03/2020 8:27 AM MRN: [id number] Endoscopist: Thornton Park MD, MD Age: 75 Referring MD:  Date of Birth: 08-22-1945 Gender: Female Account #: [id number] Procedure:                Colonoscopy Indications:              Positive Cologuard test                           Hyperplastic polyps and extensive sigmoid                            diverticulosis on colonoscopy 2011                           Recent Cologuard positive Medicines:                Monitored Anesthesia Care Procedure:                Pre-Anesthesia Assessment:                           - Prior to the procedure, a History and Physical                            was performed, and patient medications and                            allergies were reviewed. The patient's tolerance of                            previous anesthesia was also reviewed. The risks                            and benefits of the procedure and the sedation                            options and risks were discussed with the patient.                            All questions were answered, and informed consent                            was obtained. Prior Anticoagulants: The patient has                            taken no previous anticoagulant or antiplatelet                            agents. ASA Grade Assessment: II - A patient with                            mild systemic disease. After reviewing the risks                            and  benefits, the patient was deemed in                            satisfactory condition to undergo the procedure.                           After obtaining informed consent, the colonoscope                            was passed under direct vision. Throughout the                            procedure, the patient's blood pressure, pulse, and                            oxygen saturations were monitored continuously. The                             [id number] [id number]-[id number] Slim SB Colonoscope was introduced                            through the anus with the intention of advancing to                            the ileum. The scope was advanced to the sigmoid                            colon before the procedure was aborted. Medications                            were given. The colonoscopy was technically                            difficult and complex due to inadequate bowel prep.                            The patient tolerated the procedure well. The                            quality of the bowel preparation was inadequate. Scope In: [id number] AM Scope Out: 9:09:44 AM Total Procedure Duration: 0 hours 3 minutes 30 seconds  Findings:                 The perianal and digital rectal examinations were                            normal.                           Copious quantities of liquid solid stool was found                            in the entire colon, precluding visualization. I  was unable to safely advance through the sigmoid                            due to the large amount of stool and the extensive                            diverticulosis. Complications:            No immediate complications. Estimated Blood Loss:     Estimated blood loss: none. Impression:               - Preparation of the colon was inadequate.                           - Stool in the entire examined colon.                           - No specimens collected. Recommendation:           - Patient has a contact number available for                            emergencies. The signs and symptoms of potential                            delayed complications were discussed with the                            patient. Return to normal activities tomorrow.                            Written discharge instructions were provided to the                            patient.                           - Resume previous diet.                            - Continue present medications.                           - Repeat colonoscopy at the next available                            appointment because the bowel preparation was poor.                            Plan two day prep at that time.                           - Procedure tentatively scheduled for 12/08/20.                            Please use Miralax 17 g twice daily every day this  week. Will plan a two day bowel prep over the                            weekend in preparation for the procedure on Monday. Thornton Park MD, MD 12/03/2020 9:19:57 AM This report has been signed electronically.

## 2020-12-03 NOTE — Patient Instructions (Signed)
Repeat prep for Monday 1 July procedure at 1100.   YOU HAD AN ENDOSCOPIC PROCEDURE TODAY AT [facility]:   Refer to the procedure report that was given to you for any specific questions about what was found during the examination.  If the procedure report does not answer your questions, please call your gastroenterologist to clarify.  If you requested that your care partner not be given the details of your procedure findings, then the procedure report has been included in a sealed envelope for you to review at your convenience later.  YOU SHOULD EXPECT: Some feelings of bloating in the abdomen. Passage of more gas than usual.  Walking can help get rid of the air that was put into your GI tract during the procedure and reduce the bloating. If you had a lower endoscopy (such as a colonoscopy or flexible sigmoidoscopy) you may notice spotting of blood in your stool or on the toilet paper. If you underwent a bowel prep for your procedure, you may not have a normal bowel movement for a few days.  Please Note:  You might notice some irritation and congestion in your nose or some drainage.  This is from the oxygen used during your procedure.  There is no need for concern and it should clear up in a day or so.  SYMPTOMS TO REPORT IMMEDIATELY:  Following lower endoscopy (colonoscopy or flexible sigmoidoscopy):  Excessive amounts of blood in the stool  Significant tenderness or worsening of abdominal pains  Swelling of the abdomen that is new, acute  Fever of 100F or higher  For urgent or emergent issues, a gastroenterologist can be reached at any hour by calling [phone]. Do not use MyChart messaging for urgent concerns.   DIET:  We do recommend a small meal at first, but then you may proceed to your regular diet.  Drink plenty of fluids but you should avoid alcoholic beverages for 24 hours.  ACTIVITY:  You should plan to take it easy for the rest of today and you should NOT DRIVE  or use heavy machinery until tomorrow (because of the sedation medicines used during the test).    FOLLOW UP: Our staff will call the number listed on your records 48-72 hours following your procedure to check on you and address any questions or concerns that you may have regarding the information given to you following your procedure. If we do not reach you, we will leave a message.  We will attempt to reach you two times.  During this call, we will ask if you have developed any symptoms of COVID 19. If you develop any symptoms (ie: fever, flu-like symptoms, shortness of breath, cough etc.) before then, please call [phone].  If you test positive for Covid 19 in the 2 weeks post procedure, please call and report this information to [place].    If any biopsies were taken you will be contacted by phone or by letter within the next 1-3 weeks.  Please call us at [phone] if you have not heard about the biopsies in 3 weeks.   SIGNATURES/CONFIDENTIALITY: You and/or your care partner have signed paperwork which will be entered into your electronic medical record.  These signatures attest to the fact that that the information above on your After Visit Summary has been reviewed and is understood.  Full responsibility of the confidentiality of this discharge information lies with you and/or your care-partner.

## 2020-12-03 NOTE — Progress Notes (Signed)
Pt and daughter instructed with 2 day Miralax/MIralax prep- told to take dose of Miralax 17 grams BID every day until procedure.  Amb GI referral put in

## 2020-12-03 NOTE — Progress Notes (Signed)
VS-Vivian  Pt's states no medical or surgical changes since previsit or office visit.  

## 2020-12-05 ENCOUNTER — Telehealth: Payer: Self-pay

## 2020-12-05 NOTE — Telephone Encounter (Signed)
  Follow up Call-  Call back number 12/03/2020  Post procedure Call Back phone  # [phone]  Permission to leave phone message Yes  Some recent data might be hidden     Patient questions:  Do you have a fever, pain , or abdominal swelling? No. Pain Score  0 *  Have you tolerated food without any problems? Yes.    Have you been able to return to your normal activities? Yes.    Do you have any questions about your discharge instructions: Diet   No. Medications  No. Follow up visit  No.  Do you have questions or concerns about your Care? No.    Actions: * If pain score     is 4 or above:    No action needed, pain <4.    Have you developed a fever since your procedure? no  2.   Have you had an respiratory symptoms (SOB or cough) since your procedure? no  3.   Have you tested positive for COVID 19 since your procedure no  4.   Have you had any family members/close contacts diagnosed with the COVID 19 since your procedure?  no   If yes to any of these questions please route to Joylene John, RN and Joella Prince, RN

## 2020-12-08 ENCOUNTER — Encounter: Payer: Self-pay | Admitting: Gastroenterology

## 2020-12-08 ENCOUNTER — Other Ambulatory Visit: Payer: Self-pay

## 2020-12-08 ENCOUNTER — Ambulatory Visit (AMBULATORY_SURGERY_CENTER): Payer: Medicare Other | Admitting: Gastroenterology

## 2020-12-08 VITALS — BP 151/58 | HR 67 | Temp 97.8°F | Resp 15 | Ht 66.0 in | Wt 172.0 lb

## 2020-12-08 DIAGNOSIS — Z1211 Encounter for screening for malignant neoplasm of colon: Secondary | ICD-10-CM | POA: Diagnosis not present

## 2020-12-08 DIAGNOSIS — E7800 Pure hypercholesterolemia, unspecified: Secondary | ICD-10-CM | POA: Diagnosis not present

## 2020-12-08 DIAGNOSIS — R195 Other fecal abnormalities: Secondary | ICD-10-CM | POA: Diagnosis not present

## 2020-12-08 DIAGNOSIS — I10 Essential (primary) hypertension: Secondary | ICD-10-CM | POA: Diagnosis not present

## 2020-12-08 DIAGNOSIS — D126 Benign neoplasm of colon, unspecified: Secondary | ICD-10-CM

## 2020-12-08 DIAGNOSIS — K5730 Diverticulosis of large intestine without perforation or abscess without bleeding: Secondary | ICD-10-CM | POA: Diagnosis not present

## 2020-12-08 MED ORDER — SODIUM CHLORIDE 0.9 % IV SOLN: 500.0000 mL | INTRAVENOUS | Status: DC

## 2020-12-08 NOTE — Progress Notes (Signed)
Pt's states no medical or surgical changes since previsit or office visit. 

## 2020-12-08 NOTE — Patient Instructions (Signed)
Read all of the handouts given to you by your recovery room nurse.  YOU HAD AN ENDOSCOPIC PROCEDURE TODAY AT [facility]:   Refer to the procedure report that was given to you for any specific questions about what was found during the examination.  If the procedure report does not answer your questions, please call your gastroenterologist to clarify.  If you requested that your care partner not be given the details of your procedure findings, then the procedure report has been included in a sealed envelope for you to review at your convenience later.  YOU SHOULD EXPECT: Some feelings of bloating in the abdomen. Passage of more gas than usual.  Walking can help get rid of the air that was put into your GI tract during the procedure and reduce the bloating. If you had a lower endoscopy (such as a colonoscopy or flexible sigmoidoscopy) you may notice spotting of blood in your stool or on the toilet paper. If you underwent a bowel prep for your procedure, you may not have a normal bowel movement for a few days.  Please Note:  You might notice some irritation and congestion in your nose or some drainage.  This is from the oxygen used during your procedure.  There is no need for concern and it should clear up in a day or so.  SYMPTOMS TO REPORT IMMEDIATELY:  Following lower endoscopy (colonoscopy or flexible sigmoidoscopy):  Excessive amounts of blood in the stool  Significant tenderness or worsening of abdominal pains  Swelling of the abdomen that is new, acute  Fever of 100F or higher   For urgent or emergent issues, a gastroenterologist can be reached at any hour by calling [phone]. Do not use MyChart messaging for urgent concerns.    DIET:  We do recommend a high fiber diet, and stay away from red meat. Read all of the handouts given to you by your recovery room nurse.  ACTIVITY:  You should plan to take it easy for the rest of today and you should NOT DRIVE or use heavy  machinery until tomorrow (because of the sedation medicines used during the test).    FOLLOW UP: Our staff will call the number listed on your records 48-72 hours following your procedure to check on you and address any questions or concerns that you may have regarding the information given to you following your procedure. If we do not reach you, we will leave a message.  We will attempt to reach you two times.  During this call, we will ask if you have developed any symptoms of COVID 19. If you develop any symptoms (ie: fever, flu-like symptoms, shortness of breath, cough etc.) before then, please call [phone].  If you test positive for Covid 19 in the 2 weeks post procedure, please call and report this information to [place].    If any biopsies were taken you will be contacted by phone or by letter within the next 1-3 weeks.  Please call us at [phone] if you have not heard about the biopsies in 3 weeks.    SIGNATURES/CONFIDENTIALITY: You and/or your care partner have signed paperwork which will be entered into your electronic medical record.  These signatures attest to the fact that that the information above on your After Visit Summary has been reviewed and is understood.  Full responsibility of the confidentiality of this discharge information lies with you and/or your care-partner.

## 2020-12-08 NOTE — Op Note (Signed)
[place] Patient Name: April Weber Procedure Date: 12/08/2020 10:48 AM MRN: [id number] Endoscopist: Thornton Park MD, MD Age: 75 Referring MD:  Date of Birth: November 14, 1945 Gender: Female Account #: [id number] Procedure:                Colonoscopy Indications:              Positive Cologuard test                           No polyps on colonoscopy 2011                           Unable to perform colonoscopy last week due to                            incomplete prep Medicines:                Monitored Anesthesia Care Procedure:                Pre-Anesthesia Assessment:                           - Prior to the procedure, a History and Physical                            was performed, and patient medications and                            allergies were reviewed. The patient's tolerance of                            previous anesthesia was also reviewed. The risks                            and benefits of the procedure and the sedation                            options and risks were discussed with the patient.                            All questions were answered, and informed consent                            was obtained. Prior Anticoagulants: The patient has                            taken no previous anticoagulant or antiplatelet                            agents. ASA Grade Assessment: II - A patient with                            mild systemic disease. After reviewing the risks  and benefits, the patient was deemed in                            satisfactory condition to undergo the procedure.                           After obtaining informed consent, the colonoscope                            was passed under direct vision. Throughout the                            procedure, the patient's blood pressure, pulse, and                            oxygen saturations were monitored continuously. The                            CF [id number]  [id number] was introduced through the anus                            and advanced to the 3 cm into the ileum. A second                            forward view of the right colon was performed. The                            patient tolerated the procedure well. The quality                            of the bowel preparation was good. The terminal                            ileum, ileocecal valve, appendiceal orifice, and                            rectum were photographed. The colonoscopy was                            technically difficult and complex due to multiple                            diverticula in the colon and restricted mobility of                            the colon. Successful completion of the procedure                            was aided by changing the patient's position,                            withdrawing the scope and replacing with the  pediatric colonoscope and then an ultraslim                            colonoscope and applying abdominal pressure. Scope In: 10:59:05 AM Scope Out: 11:36:49 AM Scope Withdrawal Time: 0 hours 13 minutes 15 seconds  Total Procedure Duration: 0 hours 37 minutes 44 seconds  Findings:                 The perianal and digital rectal examinations were                            normal.                           Non-bleeding internal hemorrhoids were found.                           Multiple small and large-mouthed diverticula were                            found in the entire colon. The diverticulosis was                            severe in the sigmoid colon. The diverticulosis was                            scattered throughout the remainder of the colon.                           A 1 mm polyp was found in the hepatic flexure. The                            polyp was sessile. The polyp was removed with a                            cold snare. Resection and retrieval were complete.                             Estimated blood loss was minimal.                           The exam was otherwise without abnormality on                            direct and retroflexion views including evaluation                            of the distal terminal ileum. Complications:            No immediate complications. Estimated blood loss:                            Minimal. Estimated Blood Loss:     Estimated blood loss was minimal. Impression:               - Non-bleeding internal hemorrhoids.                           -  Severe diverticulosis.                           - One 1 mm polyp at the hepatic flexure, removed                            with a cold snare. Resected and retrieved.                           - The examination was otherwise normal on direct                            and retroflexion views. Recommendation:           - Patient has a contact number available for                            emergencies. The signs and symptoms of potential                            delayed complications were discussed with the                            patient. Return to normal activities tomorrow.                            Written discharge instructions were provided to the                            patient.                           - Follow a high fiber diet. Drink at least 64                            ounces of water daily. Add a daily stool bulking                            agent such as psyllium (an exampled would be                            Metamucil).                           - Continue present medications.                           - Await pathology results.                           - Repeat colonoscopy for colon cancer surveillance                            is not recommended due to current age.                           -  Emerging evidence supports eating a diet of                            fruits, vegetables, grains, calcium, and yogurt                            while reducing red meat  and alcohol may reduce the                            risk of colon cancer. Thornton Park MD, MD 12/08/2020 11:46:25 AM This report has been signed electronically.

## 2020-12-08 NOTE — Progress Notes (Signed)
Called to room to assist during endoscopic procedure.  Patient ID and intended procedure confirmed with present staff. Received instructions for my participation in the procedure from the performing physician.  

## 2020-12-08 NOTE — Progress Notes (Signed)
Report given to PACU, vss 

## 2020-12-10 ENCOUNTER — Telehealth: Payer: Self-pay | Admitting: *Deleted

## 2020-12-10 NOTE — Telephone Encounter (Signed)
  Follow up Call-  Call back number 12/08/2020 12/03/2020  Post procedure Call Back phone  # [phone]  Permission to leave phone message Yes Yes  Some recent data might be hidden     Patient questions:  Do you have a fever, pain , or abdominal swelling? No. Pain Score  0 *  Have you tolerated food without any problems? Yes.    Have you been able to return to your normal activities? Yes.    Do you have any questions about your discharge instructions: Diet   No. Medications  No. Follow up visit  No.  Do you have questions or concerns about your Care? No.  Actions: * If pain score is 4 or above: No action needed, pain <4.Have you developed a fever since your procedure? no  2.   Have you had an respiratory symptoms (SOB or cough) since your procedure? no  3.   Have you tested positive for COVID 19 since your procedure no  4.   Have you had any family members/close contacts diagnosed with the COVID 19 since your procedure?  no   If yes to any of these questions please route to Joylene John, RN and Joella Prince, RN

## 2020-12-15 ENCOUNTER — Telehealth: Payer: Self-pay | Admitting: Family Medicine

## 2020-12-15 NOTE — Telephone Encounter (Signed)
Patient aware its not back yet.

## 2020-12-16 ENCOUNTER — Encounter: Payer: Self-pay | Admitting: Gastroenterology

## 2020-12-25 ENCOUNTER — Telehealth: Payer: Self-pay | Admitting: Gastroenterology

## 2020-12-25 NOTE — Telephone Encounter (Signed)
Inbound call from patient. States she have not received the letter about her results nor a phone call.

## 2020-12-26 NOTE — Telephone Encounter (Signed)
Path letter discussed with Pt. Pt aware

## 2021-01-19 ENCOUNTER — Telehealth: Payer: Self-pay | Admitting: Family Medicine

## 2021-01-19 DIAGNOSIS — I8311 Varicose veins of right lower extremity with inflammation: Secondary | ICD-10-CM | POA: Diagnosis not present

## 2021-01-19 NOTE — Telephone Encounter (Signed)
Done placed up front for pick up

## 2021-01-20 ENCOUNTER — Other Ambulatory Visit: Payer: Self-pay | Admitting: Family Medicine

## 2021-01-20 DIAGNOSIS — Z9011 Acquired absence of right breast and nipple: Secondary | ICD-10-CM

## 2021-01-20 DIAGNOSIS — Z1231 Encounter for screening mammogram for malignant neoplasm of breast: Secondary | ICD-10-CM

## 2021-01-27 DIAGNOSIS — Z23 Encounter for immunization: Secondary | ICD-10-CM | POA: Diagnosis not present

## 2021-02-09 ENCOUNTER — Telehealth: Payer: Self-pay | Admitting: Family Medicine

## 2021-02-09 DIAGNOSIS — I10 Essential (primary) hypertension: Secondary | ICD-10-CM

## 2021-02-09 DIAGNOSIS — E890 Postprocedural hypothyroidism: Secondary | ICD-10-CM

## 2021-02-09 MED ORDER — LEVOTHYROXINE SODIUM 100 MCG PO TABS: 100.0000 ug | ORAL_TABLET | Freq: Every day | ORAL | 2 refills | Status: DC

## 2021-02-09 MED ORDER — ATENOLOL 25 MG PO TABS
25.0000 mg | ORAL_TABLET | Freq: Every day | ORAL | 0 refills | Status: DC
Start: 2021-02-09 — End: 2021-05-19

## 2021-02-09 MED ORDER — SIMVASTATIN 20 MG PO TABS: 20.0000 mg | ORAL_TABLET | Freq: Every day | ORAL | 0 refills | Status: DC

## 2021-02-09 MED ORDER — AMLODIPINE BESYLATE 5 MG PO TABS: 5.0000 mg | ORAL_TABLET | Freq: Every day | ORAL | 0 refills | Status: DC

## 2021-02-09 NOTE — Telephone Encounter (Signed)
Pt has Fosamax & Losartan till next July, refilled all others

## 2021-02-09 NOTE — Telephone Encounter (Signed)
  Prescription Request  02/09/2021   What is the name of the medication or equipment? ALL  Have you contacted your pharmacy to request a refill? YES  Which pharmacy would you like this sent to?[facility]  Pt had her last check up on 11/17/20 and didn't get refills at that time because she still had refills. Pt is now almost out of her meds and needs more refills.  Please call patient once refills have been sent.

## 2021-02-18 DIAGNOSIS — Z23 Encounter for immunization: Secondary | ICD-10-CM | POA: Diagnosis not present

## 2021-03-03 ENCOUNTER — Other Ambulatory Visit: Payer: Self-pay

## 2021-03-03 ENCOUNTER — Ambulatory Visit
Admission: RE | Admit: 2021-03-03 | Discharge: 2021-03-03 | Disposition: A | Discharge status: Home / Self Care | Payer: Medicare Other | Source: Ambulatory Visit | Attending: Family Medicine | Admitting: Family Medicine

## 2021-03-03 DIAGNOSIS — Z1231 Encounter for screening mammogram for malignant neoplasm of breast: Secondary | ICD-10-CM | POA: Diagnosis not present

## 2021-03-03 DIAGNOSIS — Z9011 Acquired absence of right breast and nipple: Secondary | ICD-10-CM

## 2021-03-03 IMAGING — MG DIGITAL SCREENING UNILAT LEFT W/ TOMO W/ CAD
4 series · 4 of 12 positions shown · non-contrast
Comparison: Previous exam(s).

CLINICAL DATA: Screening.

EXAM:
DIGITAL SCREENING UNILATERAL LEFT MAMMOGRAM WITH CAD AND
TOMOSYNTHESIS
TECHNIQUE: Left screening digital craniocaudal and mediolateral oblique
mammograms were obtained. Left screening digital breast
tomosynthesis was performed. The images were evaluated with
computer-aided detection.

[L CC synth-2D]
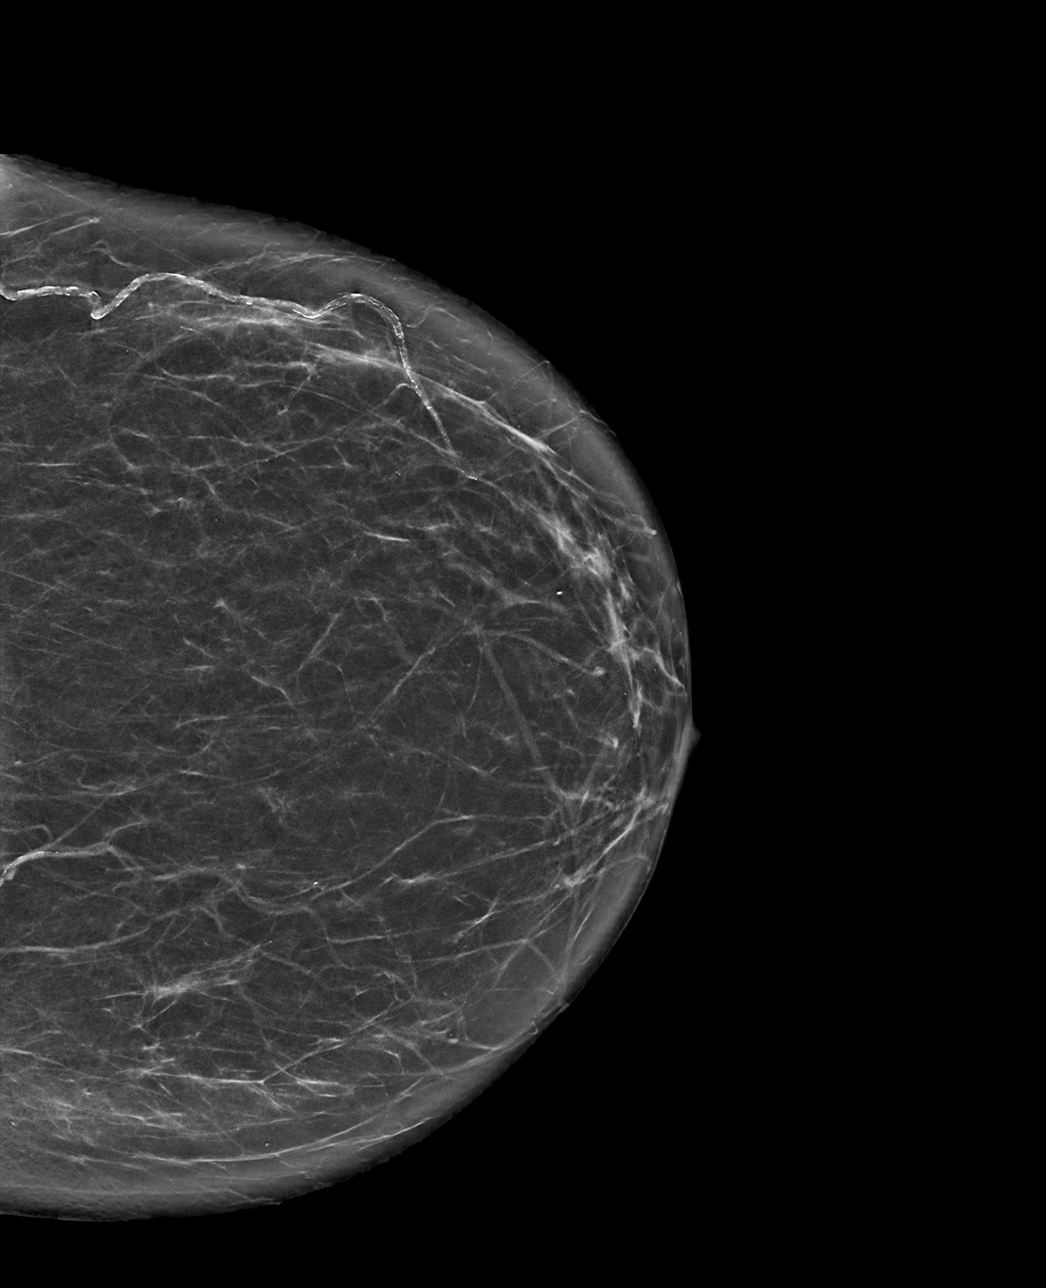

[L MLO synth-2D]
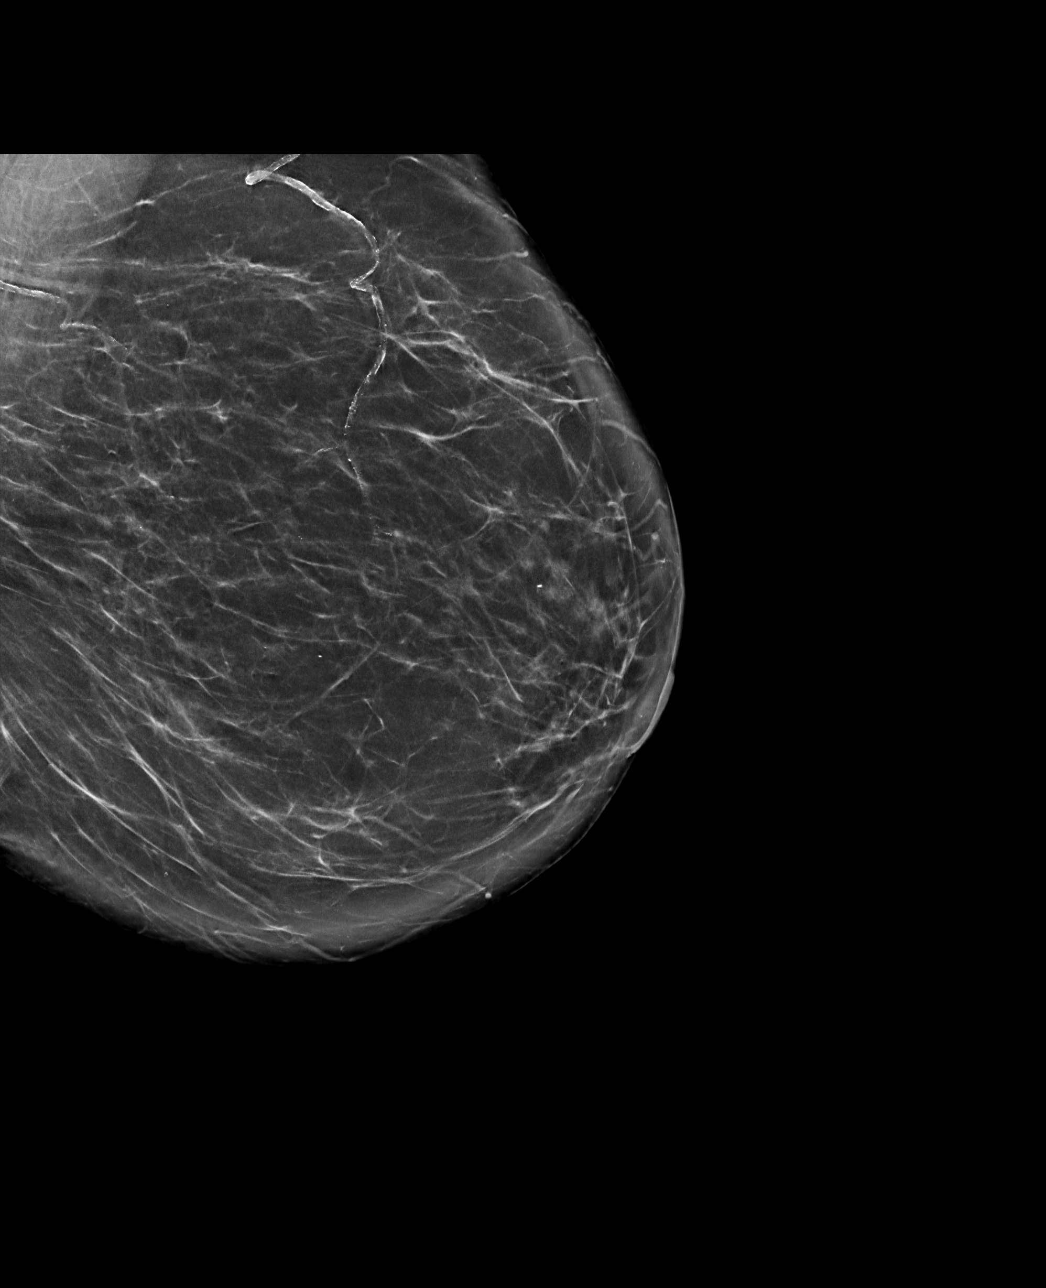

[L CC tomo · tomo slice 38/75.0]
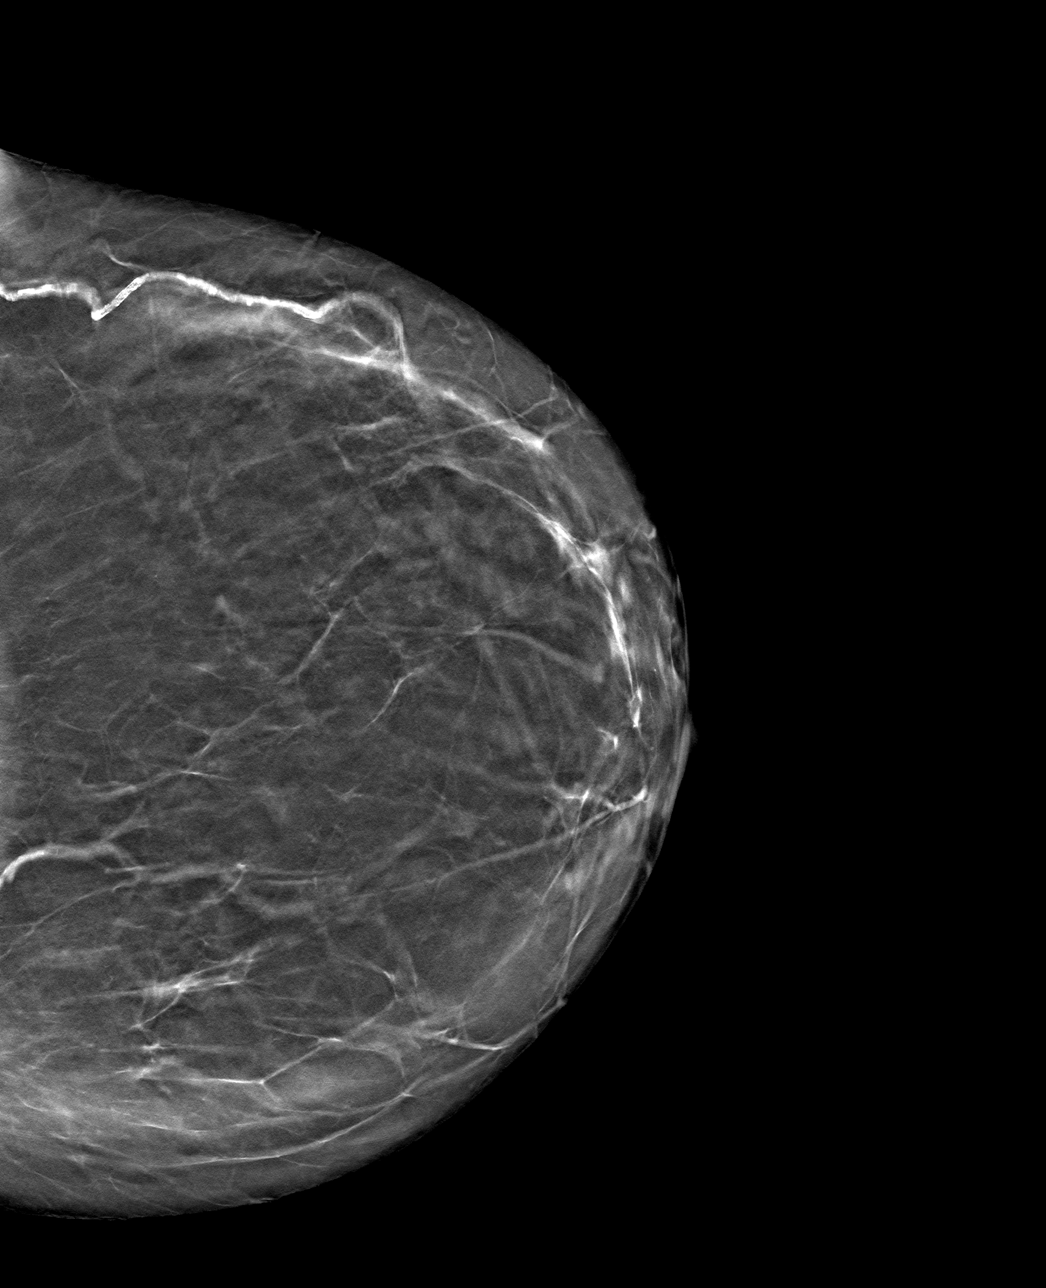

[L MLO tomo · tomo slice 45/89.0]
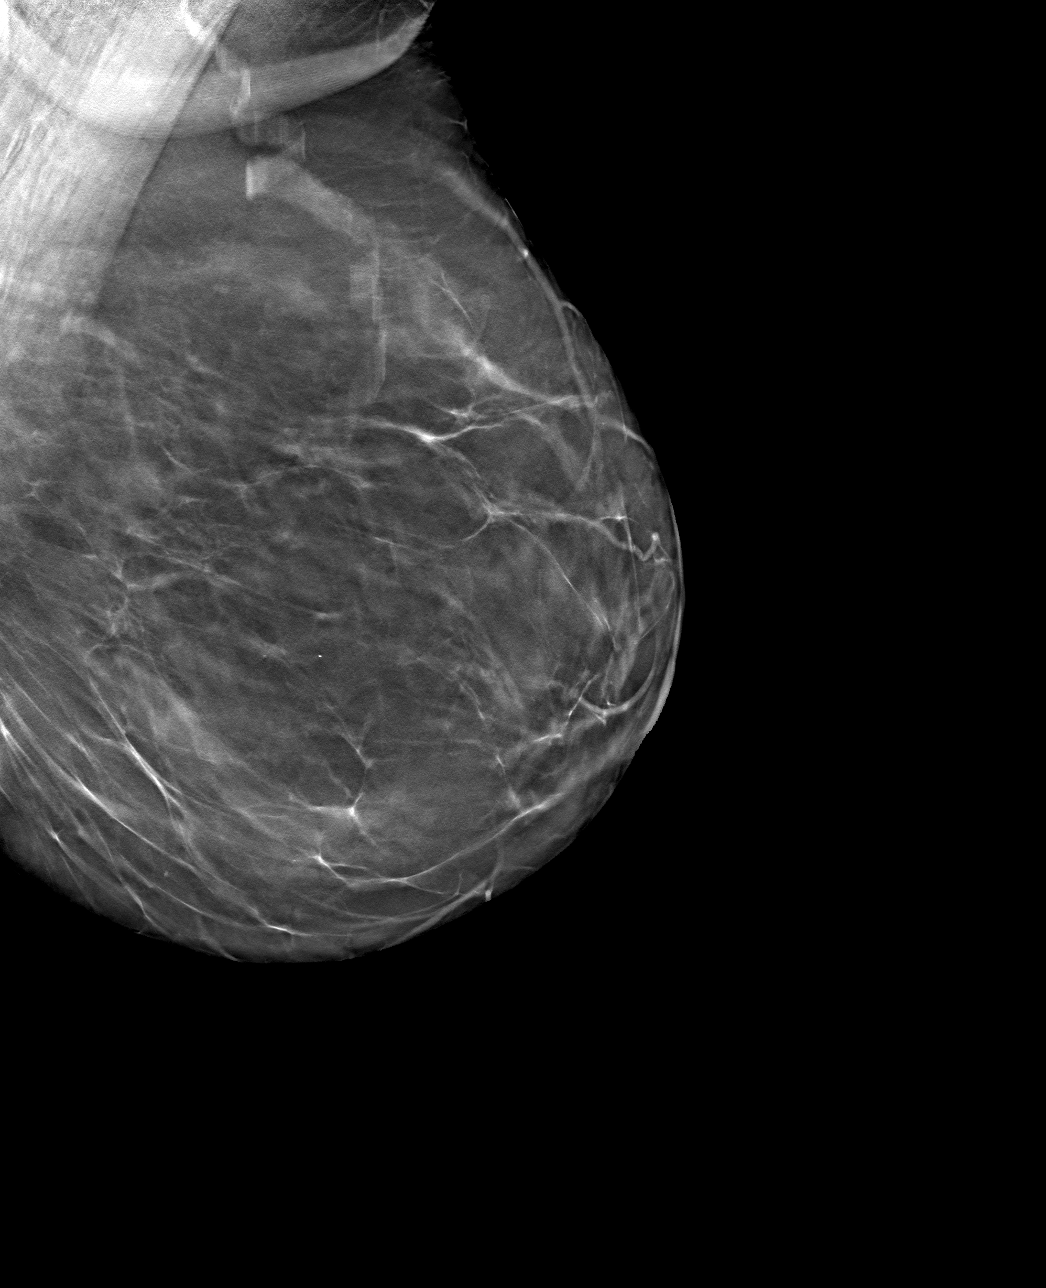

[4 of 12 positions shown; findings below may reference images not displayed]

ACR Breast Density Category b: There are scattered areas of
fibroglandular density.
FINDINGS: The patient has had a right mastectomy. There are no findings
suspicious for malignancy.
IMPRESSION: No mammographic evidence of malignancy. A result letter of this
screening mammogram will be mailed directly to the patient.

RECOMMENDATION:
Screening mammogram in one year.  (Code:[I1])

BI-RADS CATEGORY  1: Negative.

## 2021-03-25 DIAGNOSIS — U071 COVID-19: Secondary | ICD-10-CM | POA: Diagnosis not present

## 2021-05-05 ENCOUNTER — Telehealth: Payer: Self-pay | Admitting: Family Medicine

## 2021-05-05 DIAGNOSIS — E890 Postprocedural hypothyroidism: Secondary | ICD-10-CM

## 2021-05-05 DIAGNOSIS — N1831 Chronic kidney disease, stage 3a: Secondary | ICD-10-CM

## 2021-05-05 DIAGNOSIS — I10 Essential (primary) hypertension: Secondary | ICD-10-CM

## 2021-05-05 NOTE — Telephone Encounter (Signed)
Pt aware labs ordered

## 2021-05-13 ENCOUNTER — Other Ambulatory Visit: Payer: Medicare Other

## 2021-05-13 DIAGNOSIS — N1831 Chronic kidney disease, stage 3a: Secondary | ICD-10-CM | POA: Diagnosis not present

## 2021-05-13 DIAGNOSIS — E890 Postprocedural hypothyroidism: Secondary | ICD-10-CM | POA: Diagnosis not present

## 2021-05-13 DIAGNOSIS — I10 Essential (primary) hypertension: Secondary | ICD-10-CM | POA: Diagnosis not present

## 2021-05-14 LAB — CMP14+EGFR
ALT: 18 IU/L (ref 0–32)
AST: 24 IU/L (ref 0–40)
Albumin/Globulin Ratio: 1.7 (ref 1.2–2.2)
Albumin: 4.3 g/dL (ref 3.7–4.7)
Alkaline Phosphatase: 78 IU/L (ref 44–121)
BUN/Creatinine Ratio: 13 (ref 12–28)
BUN: 16 mg/dL (ref 8–27)
Bilirubin Total: 0.5 mg/dL (ref 0.0–1.2)
CO2: 24 mmol/L (ref 20–29)
Calcium: 9.7 mg/dL (ref 8.7–10.3)
Chloride: 101 mmol/L (ref 96–106)
Creatinine, Ser: 1.25 mg/dL — ABNORMAL HIGH (ref 0.57–1.00)
Globulin, Total: 2.5 g/dL (ref 1.5–4.5)
Glucose: 91 mg/dL (ref 70–99)
Potassium: 5.1 mmol/L (ref 3.5–5.2)
Sodium: 136 mmol/L (ref 134–144)
Total Protein: 6.8 g/dL (ref 6.0–8.5)
eGFR: 45 mL/min/{1.73_m2} — ABNORMAL LOW (ref >59)

## 2021-05-14 LAB — TSH: TSH: 2.6 u[IU]/mL (ref 0.450–4.500)

## 2021-05-14 LAB — RENAL FUNCTION PANEL: Phosphorus: 4.1 mg/dL (ref 3.0–4.3)

## 2021-05-14 LAB — T4, FREE: Free T4: 1.52 ng/dL (ref 0.82–1.77)

## 2021-05-19 ENCOUNTER — Encounter: Payer: Self-pay | Admitting: Family Medicine

## 2021-05-19 ENCOUNTER — Ambulatory Visit (INDEPENDENT_AMBULATORY_CARE_PROVIDER_SITE_OTHER): Payer: Medicare Other | Admitting: Family Medicine

## 2021-05-19 ENCOUNTER — Telehealth: Payer: Self-pay | Admitting: Family Medicine

## 2021-05-19 VITALS — BP 145/82 | HR 70 | Temp 98.0°F | Ht 66.0 in | Wt 173.0 lb

## 2021-05-19 DIAGNOSIS — E890 Postprocedural hypothyroidism: Secondary | ICD-10-CM

## 2021-05-19 DIAGNOSIS — N1831 Chronic kidney disease, stage 3a: Secondary | ICD-10-CM

## 2021-05-19 DIAGNOSIS — M8580 Other specified disorders of bone density and structure, unspecified site: Secondary | ICD-10-CM

## 2021-05-19 DIAGNOSIS — E7800 Pure hypercholesterolemia, unspecified: Secondary | ICD-10-CM | POA: Diagnosis not present

## 2021-05-19 DIAGNOSIS — I10 Essential (primary) hypertension: Secondary | ICD-10-CM

## 2021-05-19 MED ORDER — SIMVASTATIN 20 MG PO TABS: 20.0000 mg | ORAL_TABLET | Freq: Every day | ORAL | 3 refills | Status: DC

## 2021-05-19 MED ORDER — AMLODIPINE BESYLATE 5 MG PO TABS: 5.0000 mg | ORAL_TABLET | Freq: Every day | ORAL | 3 refills | Status: DC

## 2021-05-19 MED ORDER — ATENOLOL 25 MG PO TABS: 25.0000 mg | ORAL_TABLET | Freq: Every day | ORAL | 3 refills | Status: DC

## 2021-05-19 MED ORDER — LOSARTAN POTASSIUM 100 MG PO TABS: 100.0000 mg | ORAL_TABLET | Freq: Every day | ORAL | 3 refills | Status: DC

## 2021-05-19 MED ORDER — ALENDRONATE SODIUM 70 MG PO TABS: 70.0000 mg | ORAL_TABLET | ORAL | 3 refills | Status: DC

## 2021-05-19 MED ORDER — LEVOTHYROXINE SODIUM 100 MCG PO TABS: ORAL_TABLET | ORAL | 3 refills | Status: DC

## 2021-05-19 NOTE — Telephone Encounter (Signed)
Left message advising medication was added to active med list and to call back with concerns or questions.

## 2021-05-19 NOTE — Progress Notes (Signed)
Subjective: CC:f/u CKD3a, hypothyroidism PCP: April Norlander, DO GEX:BMWUXLKG April Weber is April 76 y.o. female presenting to clinic today for:  1. CKD3a associated with HTN and hyperlipidemia Compliant with Norvasc, Tenormin, Cozaar and Zocor.  No chest pain, shortness of breath.  She hydrates well.  She tries to stay physically active and watch her diet.  She follows an extremely strict diet because she does not want to advance her renal disease but voices quite April bit of concern that her GFR dropped from 55-45 today.  She is wanting to go ahead with referral to renal for further evaluation.  2. Hypothyroidism Compliant with Synthroid 100 mcg daily except for 50 mcg on Sundays.  No reports of change in voice, tremor or heart palpitations.  3.  Osteopenia with high risk of fracture. Patient had DEXA scan performed October 2021 which showed osteopenia but April 10-year probability of hip fracture 3.7%.  She was subsequently started on Fosamax.  Patient is compliant with Fosamax.   ROS: Per HPI  Allergies  Allergen Reactions   Codeine     REACTION: makes sick   Doxycycline     REACTION: whelps hives   Iodine     REACTION: breakout   Penicillins     REACTION: whelps hives   Past Medical History:  Diagnosis Date   Breast cancer ([place]) 1980   RIGHT   Chronic kidney disease    has one kidney   DVT (deep venous thrombosis) ([facility])    on RIGHT leg-hx of   Hyperlipidemia    on meds   Hypertension    on meds   Osteopenia    Seasonal allergies    Thyroid disease    on meds    Current Outpatient Medications:    alendronate (FOSAMAX) 70 MG tablet, Take 1 tablet (70 mg total) by mouth every 7 (seven) days. Take with April full glass of water on an empty stomach., Disp: 12 tablet, Rfl: 3   amLODipine (NORVASC) 5 MG tablet, Take 1 tablet (5 mg total) by mouth daily., Disp: 90 tablet, Rfl: 0   aspirin 81 MG EC tablet, Take 81 mg by mouth daily., Disp: , Rfl:    atenolol (TENORMIN) 25 MG  tablet, Take 1 tablet (25 mg total) by mouth daily., Disp: 90 tablet, Rfl: 0   Calcium 600-200 MG-UNIT per tablet, Take 1 tablet by mouth 2 (two) times daily., Disp: , Rfl:    levothyroxine (SYNTHROID) 100 MCG tablet, Take 1 tablet (100 mcg total) by mouth daily., Disp: 90 tablet, Rfl: 2   losartan (COZAAR) 100 MG tablet, Take 1 tablet (100 mg total) by mouth daily., Disp: 90 tablet, Rfl: 3   Multiple Vitamin (MULTIVITAMIN) capsule, Take 1 capsule by mouth daily., Disp: , Rfl:    simvastatin (ZOCOR) 20 MG tablet, Take 1 tablet (20 mg total) by mouth at bedtime. Cancel rx for 10mg ., Disp: 90 tablet, Rfl: 0   triamcinolone (NASACORT) 55 MCG/ACT AERO nasal inhaler, Place 2 sprays into the nose daily., Disp: , Rfl:  Social History   Socioeconomic History   Marital status: Single    Spouse name: Divorced    Number of children: 1   Years of education: Not on file   Highest education level: Not on file  Occupational History   Occupation: Retired    Comment: worked at [employer] in [employer]  Tobacco Use   Smoking status: Former    Packs/day: 0.25    Types: Cigarettes  Quit date: 05/11/1967    Years since quitting: 54.0   Smokeless tobacco: Never  Vaping Use   Vaping Use: Never used  Substance and Sexual Activity   Alcohol use: No   Drug use: No   Sexual activity: Not Currently    Birth control/protection: Post-menopausal, Surgical    Comment: hyst  Other Topics Concern   Not on file  Social History Narrative   Lives alone - very close with her neighbors   Social Determinants of Health   Financial Resource Strain: Low Risk    Difficulty of Paying Living Expenses: Not hard at all  Food Insecurity: No Food Insecurity   Worried About Charity fundraiser in the Last Year: Never true   [place] in the Last Year: Never true  Transportation Needs: No Transportation Needs   Lack of Transportation (Medical): No   Lack of Transportation (Non-Medical):  No  Physical Activity: Sufficiently Active   Days of Exercise per Week: 7 days   Minutes of Exercise per Session: 40 min  Stress: No Stress Concern Present   Feeling of Stress : Not at all  Social Connections: Moderately Integrated   Frequency of Communication with Friends and Family: More than three times April week   Frequency of Social Gatherings with Friends and Family: More than three times April week   Attends Religious Services: More than 4 times per year   Active Member of [employer] or Organizations: Yes   Attends Music therapist: More than 4 times per year   Marital Status: Divorced  Human resources officer Violence: Not At Risk   Fear of Current or Ex-Partner: No   Emotionally Abused: No   Physically Abused: No   Sexually Abused: No   Family History  Problem Relation Age of Onset   Heart attack Mother    Alcohol abuse Mother    Brain cancer Mother    Breast cancer Maternal Aunt    Colon polyps Neg Hx    Colon cancer Neg Hx    Esophageal cancer Neg Hx    Stomach cancer Neg Hx    Rectal cancer Neg Hx     Objective: Office vital signs reviewed. BP (!) 145/82    Pulse 70    Temp 98 F (36.7 C) (Temporal)    Ht 5\' 6"  (1.676 m)    Wt 173 lb (78.5 kg)    SpO2 98%    BMI 27.92 kg/m   Physical Examination:  General: Awake, alert, well nourished, No acute distress HEENT: Sclera white.  Moist mucous membranes.  No exophthalmos or goiter Cardio: regular rate and rhythm, S1S2 heard, no murmurs appreciated Pulm: clear to auscultation bilaterally, no wheezes, rhonchi or rales; normal work of breathing on room air Extremities: warm, well perfused, No edema, cyanosis or clubbing; +2 pulses bilaterally  Assessment/ Plan: 76 y.o. female   Postoperative hypothyroidism - Plan: levothyroxine (SYNTHROID) 100 MCG tablet, TSH, T4, free  Stage 3a chronic kidney disease ([place]) - Plan: Ambulatory referral to Nephrology, Renal Function Panel, VITAMIN D 25 Hydroxy (Vit-D Deficiency,  Fractures), CBC  White coat syndrome with diagnosis of hypertension  Essential hypertension - Plan: amLODipine (NORVASC) 5 MG tablet, atenolol (TENORMIN) 25 MG tablet, losartan (COZAAR) 100 MG tablet  Osteopenia with high risk of fracture - Plan: alendronate (FOSAMAX) 70 MG tablet, VITAMIN D 25 Hydroxy (Vit-D Deficiency, Fractures)  Pure hypercholesterolemia - Plan: Lipid panel  Thyroid levels were normal.  Continue current regimen of Synthroid 100 mcg  daily except for 50 mcg on Sundays  She does have April slight drop in GFR but she still considered CKD 3 April.  I think April referral to nephrology is reasonable since she has quite April bit of concern about this.  We discussed consideration for something like Farxiga to improve renal function.  She does worry about possible yeast vaginitis side effects with this medicine so would like to discuss with nephrology first.  I did offer her referral to CCM here for patient assistance with Wilder Glade if cost is April concern.  I reviewed her blood pressure readings and they do fluctuate between the low 277A to 128N systolic with the 867 being April outlier.  Diastolics remain fairly controlled.  For now we will continue current regimen but I suspect that initiation of something like Wilder Glade would help with these fluctuations as well.  Blood pressure was under control for age today  With regards to her Fosamax, her GFR remains above 35 so there is no contraindications to ongoing use of this medication.  We did discuss consideration for Prolia if she wishes to proceed with something like that I be glad to order.  No orders of the defined types were placed in this encounter.  No orders of the defined types were placed in this encounter.  She may follow-up in 6 months, sooner if concerns arise.  Fasting labs have been preordered  April Weber, [place] [phone]

## 2021-07-01 ENCOUNTER — Other Ambulatory Visit: Payer: Self-pay | Admitting: Nephrology

## 2021-07-01 DIAGNOSIS — N1831 Chronic kidney disease, stage 3a: Secondary | ICD-10-CM

## 2021-07-01 DIAGNOSIS — N189 Chronic kidney disease, unspecified: Secondary | ICD-10-CM | POA: Diagnosis not present

## 2021-07-01 DIAGNOSIS — D631 Anemia in chronic kidney disease: Secondary | ICD-10-CM | POA: Diagnosis not present

## 2021-07-01 DIAGNOSIS — N2581 Secondary hyperparathyroidism of renal origin: Secondary | ICD-10-CM | POA: Diagnosis not present

## 2021-07-01 DIAGNOSIS — I129 Hypertensive chronic kidney disease with stage 1 through stage 4 chronic kidney disease, or unspecified chronic kidney disease: Secondary | ICD-10-CM | POA: Diagnosis not present

## 2021-07-06 ENCOUNTER — Ambulatory Visit
Admission: RE | Admit: 2021-07-06 | Discharge: 2021-07-06 | Disposition: A | Discharge status: Home / Self Care | Payer: Medicare Other | Source: Ambulatory Visit | Attending: Nephrology | Admitting: Nephrology

## 2021-07-06 DIAGNOSIS — N189 Chronic kidney disease, unspecified: Secondary | ICD-10-CM | POA: Diagnosis not present

## 2021-07-06 DIAGNOSIS — N281 Cyst of kidney, acquired: Secondary | ICD-10-CM | POA: Diagnosis not present

## 2021-07-06 DIAGNOSIS — N1831 Chronic kidney disease, stage 3a: Secondary | ICD-10-CM

## 2021-07-06 DIAGNOSIS — Q600 Renal agenesis, unilateral: Secondary | ICD-10-CM | POA: Diagnosis not present

## 2021-07-06 IMAGING — US US RENAL
1 series · 14 of 25 positions shown · non-contrast
Comparison: No prior.

CLINICAL DATA: Chronic renal disease.  Solitary kidney.

EXAM:
RENAL / URINARY TRACT ULTRASOUND COMPLETE

[Series 1: us renal · 0.23mm/px · 14 of 30 slices shown]
[im 1/30]
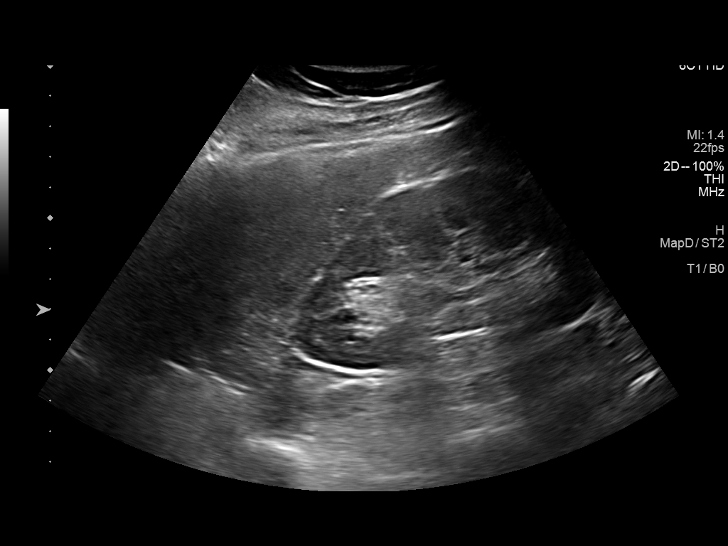
[im 3/30]
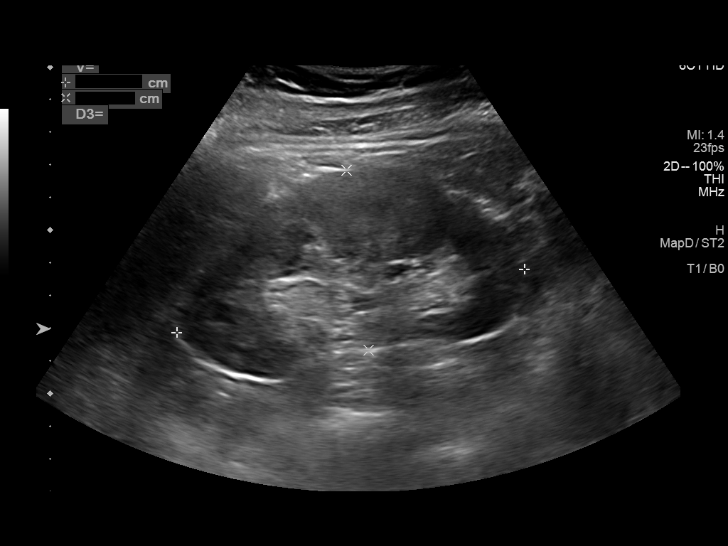
[im 5/30]
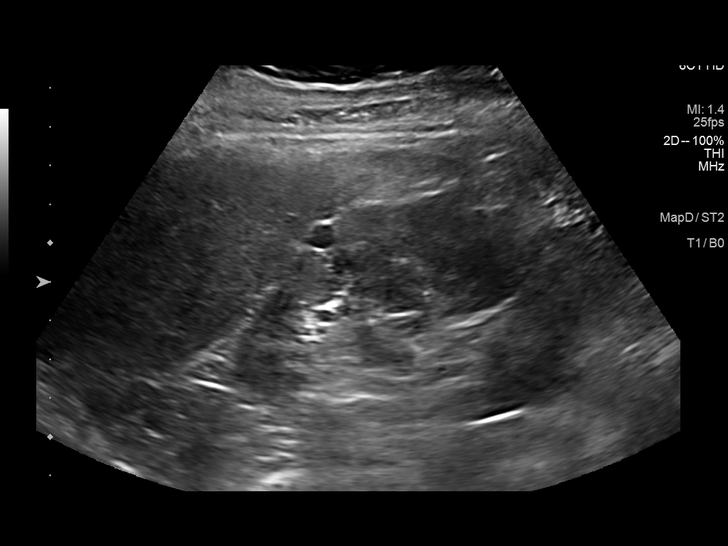
[im 8/30]
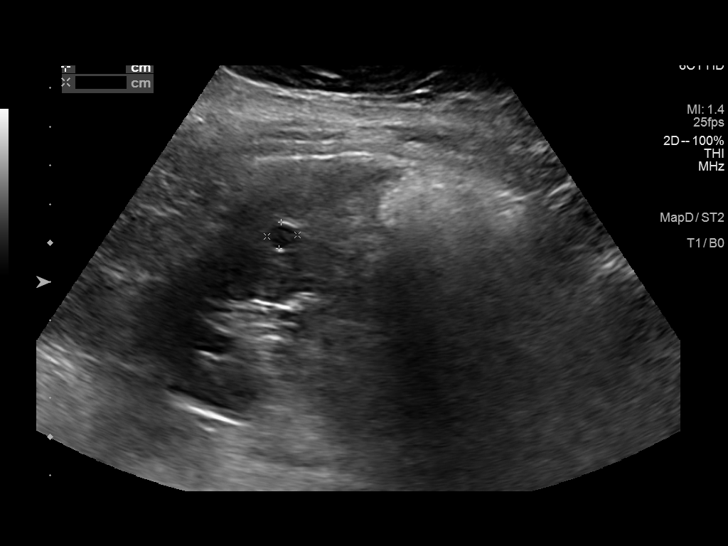
[im 10/30]
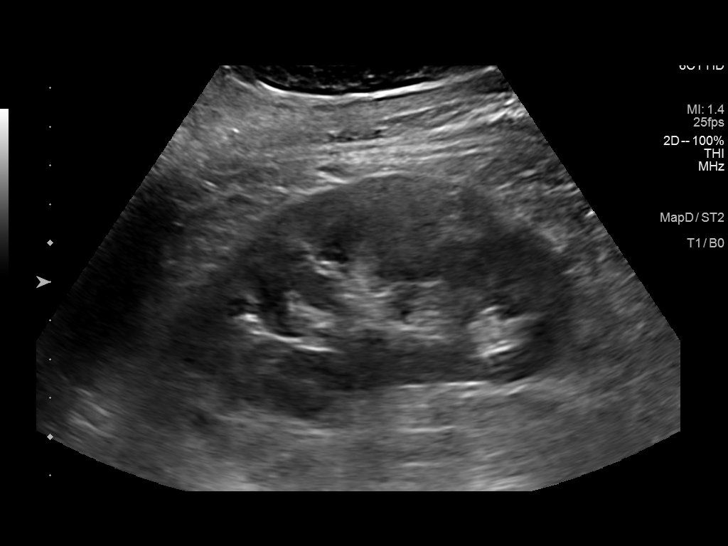
[im 11/30]
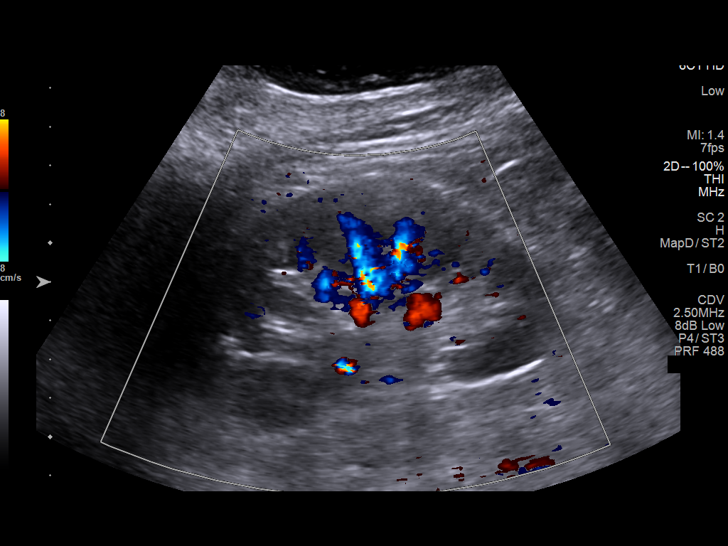
[im 14/30]
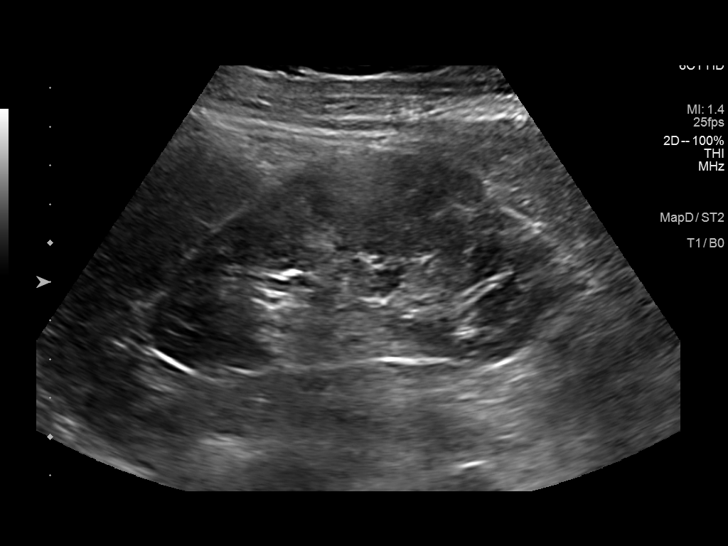
[im 16/30]
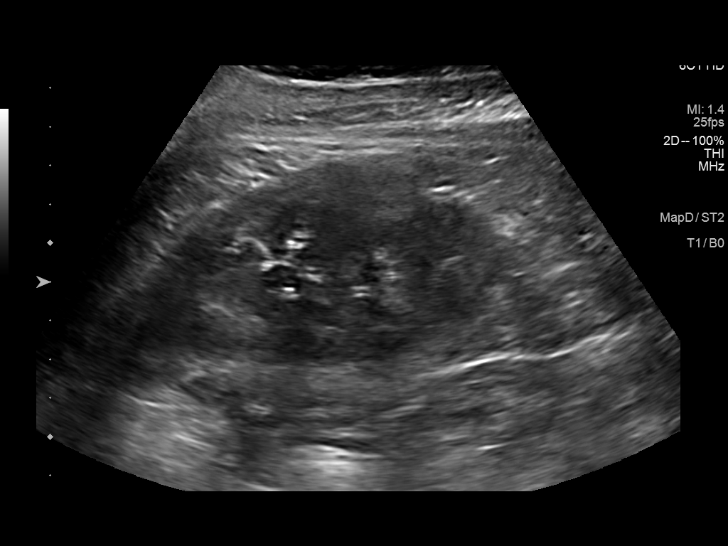
[im 19/30]
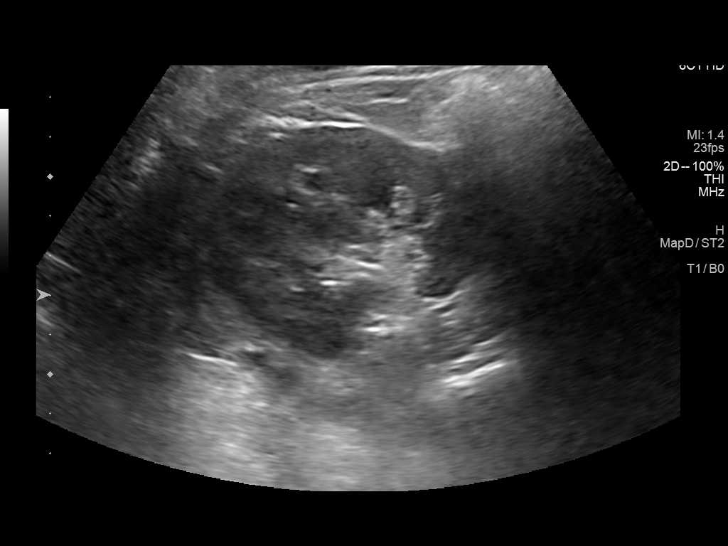
[im 20/30]
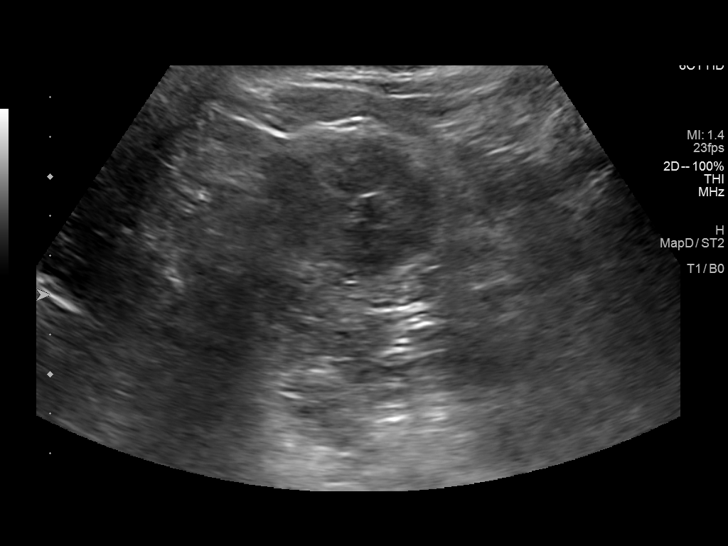
[im 22/30]
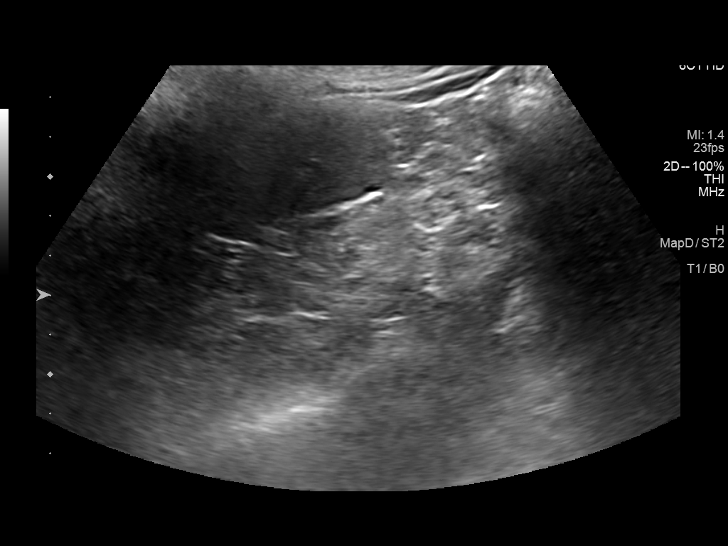
[im 25/30]
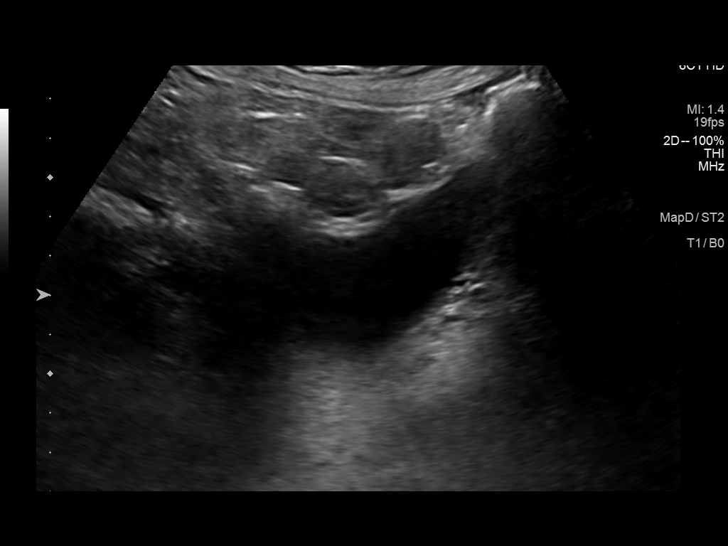
[im 27/30]
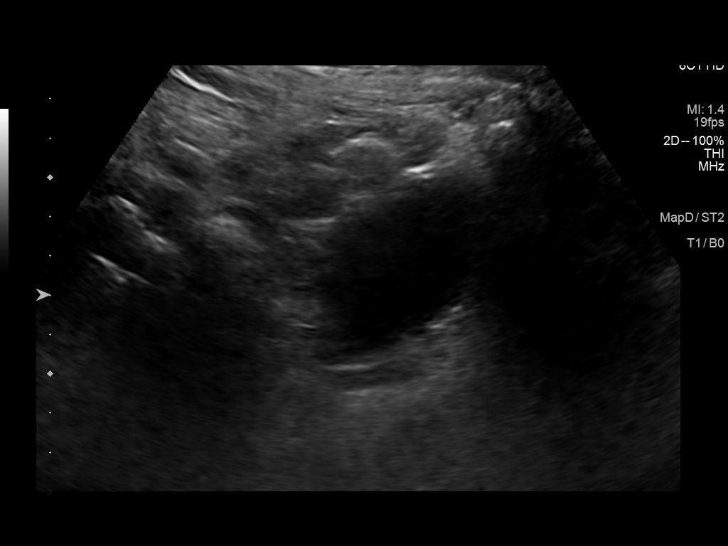
[im 30/30]
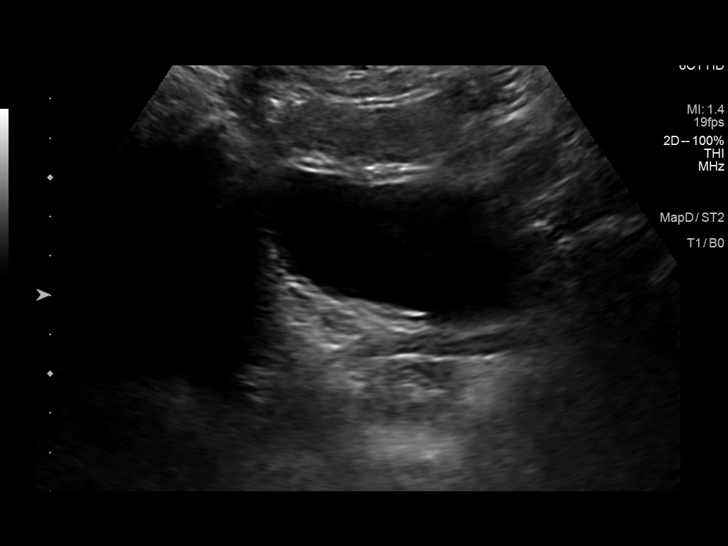

[14 of 25 positions shown; findings below may reference images not displayed]

FINDINGS: Right Kidney:

Renal measurements: 10.8 x 5.5 x 5.7 cm = volume: 178 mL.
Echogenicity within normal limits. 8 mm simple cyst. No
hydronephrosis visualized.

Left Kidney:

Renal measurements: No left kidney visualized.

Bladder:

Appears normal for degree of bladder distention.

Other:

None.
IMPRESSION: 1. 8 mm simple cyst right kidney. Right kidney otherwise appears
unremarkable. No hydronephrosis.

2. Left kidney not visualized. Patient has a history of solitary
kidney.

## 2021-08-06 ENCOUNTER — Telehealth: Payer: Self-pay | Admitting: Family Medicine

## 2021-08-06 NOTE — Telephone Encounter (Signed)
Patient is dropping off a paper for G to look at.  ?

## 2021-08-06 NOTE — Telephone Encounter (Signed)
I personally really like Dr Theador Hawthorne with [facility] in [place] ?

## 2021-08-06 NOTE — Telephone Encounter (Signed)
PT AWARE OF RECOMMENDATIONS  

## 2021-08-26 ENCOUNTER — Ambulatory Visit (INDEPENDENT_AMBULATORY_CARE_PROVIDER_SITE_OTHER): Payer: Medicare Other

## 2021-08-26 VITALS — Wt 173.0 lb

## 2021-08-26 DIAGNOSIS — Z0000 Encounter for general adult medical examination without abnormal findings: Secondary | ICD-10-CM | POA: Diagnosis not present

## 2021-08-26 NOTE — Patient Instructions (Signed)
April Weber , ?Thank you for taking time to come for your Medicare Wellness Visit. I appreciate your ongoing commitment to your health goals. Please review the following plan we discussed and let me know if I can assist you in the future.  ? ?Screening recommendations/referrals: ?Colonoscopy: Done 12/08/2020 - no repeat ?Mammogram: Done 03/03/2021 - Repeat annually ?Bone Density: Done 03/05/2020 - Repeat every 2 years ?Recommended yearly ophthalmology/optometry visit for glaucoma screening and checkup ?Recommended yearly dental visit for hygiene and checkup ? ?Vaccinations: ?Influenza vaccine: Done 02/18/2021 - Repeat annually ?Pneumococcal vaccine: Done  10/24/2009 & 08/14/2014    ?Tdap vaccine: Done 01/29/2019 - Repeat in 10 years ?Shingles vaccine: Zostavax done in 2008 - DECLINES SHINGRIX   ?Covid-19:Done 06/15/2019, 07/14/2019, 03/11/2020, 08/19/2020, & 01/27/2021   ? ?Advanced directives: in chart ? ?Conditions/risks identified: Aim for 30 minutes of exercise or brisk walking, 6-8 glasses of water, and 5 servings of fruits and vegetables each day.  ? ?Next appointment: Follow up in one year for your annual wellness visit  ? ? ?Preventive Care 59 Years and Older, Female ?Preventive care refers to lifestyle choices and visits with your health care provider that can promote health and wellness. ?What does preventive care include? ?A yearly physical exam. This is also called an annual well check. ?Dental exams once or twice a year. ?Routine eye exams. Ask your health care provider how often you should have your eyes checked. ?Personal lifestyle choices, including: ?Daily care of your teeth and gums. ?Regular physical activity. ?Eating a healthy diet. ?Avoiding tobacco and drug use. ?Limiting alcohol use. ?Practicing safe sex. ?Taking low-dose aspirin every day. ?Taking vitamin and mineral supplements as recommended by your health care provider. ?What happens during an annual well check? ?The services and screenings done by your  health care provider during your annual well check will depend on your age, overall health, lifestyle risk factors, and family history of disease. ?Counseling  ?Your health care provider may ask you questions about your: ?Alcohol use. ?Tobacco use. ?Drug use. ?Emotional well-being. ?Home and relationship well-being. ?Sexual activity. ?Eating habits. ?History of falls. ?Memory and ability to understand (cognition). ?Work and work Statistician. ?Reproductive health. ?Screening  ?You may have the following tests or measurements: ?Height, weight, and BMI. ?Blood pressure. ?Lipid and cholesterol levels. These may be checked every 5 years, or more frequently if you are over 40 years old. ?Skin check. ?Lung cancer screening. You may have this screening every year starting at age 72 if you have a 30-pack-year history of smoking and currently smoke or have quit within the past 15 years. ?Fecal occult blood test (FOBT) of the stool. You may have this test every year starting at age 10. ?Flexible sigmoidoscopy or colonoscopy. You may have a sigmoidoscopy every 5 years or a colonoscopy every 10 years starting at age 69. ?Hepatitis C blood test. ?Hepatitis B blood test. ?Sexually transmitted disease (STD) testing. ?Diabetes screening. This is done by checking your blood sugar (glucose) after you have not eaten for a while (fasting). You may have this done every 1-3 years. ?Bone density scan. This is done to screen for osteoporosis. You may have this done starting at age 3. ?Mammogram. This may be done every 1-2 years. Talk to your health care provider about how often you should have regular mammograms. ?Talk with your health care provider about your test results, treatment options, and if necessary, the need for more tests. ?Vaccines  ?Your health care provider may recommend certain vaccines, such as: ?  Influenza vaccine. This is recommended every year. ?Tetanus, diphtheria, and acellular pertussis (Tdap, Td) vaccine. You may  need a Td booster every 10 years. ?Zoster vaccine. You may need this after age 77. ?Pneumococcal 13-valent conjugate (PCV13) vaccine. One dose is recommended after age 83. ?Pneumococcal polysaccharide (PPSV23) vaccine. One dose is recommended after age 40. ?Talk to your health care provider about which screenings and vaccines you need and how often you need them. ?This information is not intended to replace advice given to you by your health care provider. Make sure you discuss any questions you have with your health care provider. ?Document Released: 05/23/2015 Document Revised: 01/14/2016 Document Reviewed: 02/25/2015 ?Elsevier Interactive Patient Education ? 2017 [place]. ? ?Fall Prevention in the Home ?Falls can cause injuries. They can happen to people of all ages. There are many things you can do to make your home safe and to help prevent falls. ?What can I do on the outside of my home? ?Regularly fix the edges of walkways and driveways and fix any cracks. ?Remove anything that might make you trip as you walk through a door, such as a raised step or threshold. ?Trim any bushes or trees on the path to your home. ?Use bright outdoor lighting. ?Clear any walking paths of anything that might make someone trip, such as rocks or tools. ?Regularly check to see if handrails are loose or broken. Make sure that both sides of any steps have handrails. ?Any raised decks and porches should have guardrails on the edges. ?Have any leaves, snow, or ice cleared regularly. ?Use sand or salt on walking paths during winter. ?Clean up any spills in your garage right away. This includes oil or grease spills. ?What can I do in the bathroom? ?Use night lights. ?Install grab bars by the toilet and in the tub and shower. Do not use towel bars as grab bars. ?Use non-skid mats or decals in the tub or shower. ?If you need to sit down in the shower, use a plastic, non-slip stool. ?Keep the floor dry. Clean up any water that spills on  the floor as soon as it happens. ?Remove soap buildup in the tub or shower regularly. ?Attach bath mats securely with double-sided non-slip rug tape. ?Do not have throw rugs and other things on the floor that can make you trip. ?What can I do in the bedroom? ?Use night lights. ?Make sure that you have a light by your bed that is easy to reach. ?Do not use any sheets or blankets that are too big for your bed. They should not hang down onto the floor. ?Have a firm chair that has side arms. You can use this for support while you get dressed. ?Do not have throw rugs and other things on the floor that can make you trip. ?What can I do in the kitchen? ?Clean up any spills right away. ?Avoid walking on wet floors. ?Keep items that you use a lot in easy-to-reach places. ?If you need to reach something above you, use a strong step stool that has a grab bar. ?Keep electrical cords out of the way. ?Do not use floor polish or wax that makes floors slippery. If you must use wax, use non-skid floor wax. ?Do not have throw rugs and other things on the floor that can make you trip. ?What can I do with my stairs? ?Do not leave any items on the stairs. ?Make sure that there are handrails on both sides of the stairs and use them.  Fix handrails that are broken or loose. Make sure that handrails are as long as the stairways. ?Check any carpeting to make sure that it is firmly attached to the stairs. Fix any carpet that is loose or worn. ?Avoid having throw rugs at the top or bottom of the stairs. If you do have throw rugs, attach them to the floor with carpet tape. ?Make sure that you have a light switch at the top of the stairs and the bottom of the stairs. If you do not have them, ask someone to add them for you. ?What else can I do to help prevent falls? ?Wear shoes that: ?Do not have high heels. ?Have rubber bottoms. ?Are comfortable and fit you well. ?Are closed at the toe. Do not wear sandals. ?If you use a stepladder: ?Make sure  that it is fully opened. Do not climb a closed stepladder. ?Make sure that both sides of the stepladder are locked into place. ?Ask someone to hold it for you, if possible. ?Clearly mark and make sure th

## 2021-08-26 NOTE — Progress Notes (Signed)
? ?Subjective:  ? April Weber is a 76 y.o. female who presents for Medicare Annual (Subsequent) preventive examination. ? ?Virtual Visit via Telephone Note ? ?I connected with  April Weber on 08/26/21 at  2:45 PM EDT by telephone and verified that I am speaking with the correct person using two identifiers. ? ?Location: ?Patient: Home ?Provider: [facility] ?Persons participating in the virtual visit: patient/Nurse Health Advisor ?  ?I discussed the limitations, risks, security and privacy concerns of performing an evaluation and management service by telephone and the availability of in person appointments. The patient expressed understanding and agreed to proceed. ? ?Interactive audio and video telecommunications were attempted between this nurse and patient, however failed, due to patient having technical difficulties OR patient did not have access to video capability.  We continued and completed visit with audio only. ? ?Some vital signs may be absent or patient reported.  ? ?Asaiah Hunnicutt Dionne Ano, LPN  ? ?Review of Systems    ? ?Cardiac Risk Factors include: advanced age (>37mn, >>44women);dyslipidemia;hypertension ? ?   ?Objective:  ?  ?Today's Vitals  ? 08/26/21 1434  ?Weight: 173 lb (78.5 kg)  ? ?Body mass index is 27.92 kg/m?. ? ? ?  08/26/2021  ?  2:48 PM 08/25/2020  ?  3:26 PM 02/24/2016  ? 12:59 PM 08/14/2014  ?  2:35 PM 08/14/2014  ?  2:33 PM  ?Advanced Directives  ?Does Patient Have a Medical Advance Directive? Yes Yes Yes  Yes  ?Type of AParamed[place]of [place] will [place] will   [place] will  ?Does patient want to make changes to medical advance directive?     No - Patient declined  ?Copy of [place] Chart? Yes - validated most recent copy scanned in chart (See row information) No - copy requested  No - copy requested Yes  ? ? ?Current Medications (verified) ?Outpatient Encounter Medications as of  08/26/2021  ?Medication Sig  ? alendronate (FOSAMAX) 70 MG tablet Take 1 tablet (70 mg total) by mouth every 7 (seven) days. Take with a full glass of water on an empty stomach.  ? amLODipine (NORVASC) 5 MG tablet Take 1 tablet (5 mg total) by mouth daily.  ? aspirin 81 MG EC tablet Take 81 mg by mouth daily.  ? atenolol (TENORMIN) 25 MG tablet Take 1 tablet (25 mg total) by mouth daily.  ? Calcium 600-200 MG-UNIT per tablet Take 1 tablet by mouth 2 (two) times daily.  ? levothyroxine (SYNTHROID) 100 MCG tablet Take 1/2 tablet on Sundays and 1 tablet daily all other days  ? loratadine (CLARITIN) 10 MG tablet Take 10 mg by mouth daily.  ? losartan (COZAAR) 100 MG tablet Take 1 tablet (100 mg total) by mouth daily.  ? Multiple Vitamin (MULTIVITAMIN) capsule Take 1 capsule by mouth daily.  ? simvastatin (ZOCOR) 20 MG tablet Take 1 tablet (20 mg total) by mouth at bedtime. Cancel rx for '10mg'$ .  ? triamcinolone (NASACORT) 55 MCG/ACT AERO nasal inhaler Place 2 sprays into the nose daily.  ? ?No facility-administered encounter medications on file as of 08/26/2021.  ? ? ?Allergies (verified) ?Codeine, Doxycycline, Iodine, and Penicillins  ? ?History: ?Past Medical History:  ?Diagnosis Date  ? [place]  ? RIGHT  ? Chronic kidney disease   ? has one kidney  ? DVT (deep venous thrombosis) ([place]   ? on RIGHT leg-hx of  ? Hyperlipidemia   ?  on meds  ? Hypertension   ? on meds  ? Osteopenia   ? Seasonal allergies   ? Thyroid disease   ? on meds  ? ?Past Surgical History:  ?Procedure Laterality Date  ? ABDOMINAL HYSTERECTOMY    ? APPENDECTOMY    ? BREAST BIOPSY    ? COLONOSCOPY  2011  ? Dr.Kaplan-normal  ? EYE SURGERY    ? 76 years old  ? MASTECTOMY Right 1984  ? ROTATOR CUFF REPAIR    ? THYROID SURGERY Bilateral 1967  ? VARICOSE VEIN SURGERY    ? ablation  ? WISDOM TOOTH EXTRACTION    ? ?Family History  ?Problem Relation Age of Onset  ? Heart attack Mother   ? Alcohol abuse Mother   ? Brain cancer Mother   ? Breast  cancer Maternal Aunt   ? Colon polyps Neg Hx   ? Colon cancer Neg Hx   ? Esophageal cancer Neg Hx   ? Stomach cancer Neg Hx   ? Rectal cancer Neg Hx   ? ?Social History  ? ?Socioeconomic History  ? Marital status: Single  ?  Spouse name: Divorced   ? Number of children: 1  ? Years of education: Not on file  ? Highest education level: Not on file  ?Occupational History  ? Occupation: Retired  ?  Comment: worked at [employer] in [place]  ?Tobacco Use  ? Smoking status: Former  ?  Packs/day: 0.25  ?  Types: Cigarettes  ?  Quit date: 05/11/1967  ?  Years since quitting: 54.3  ? Smokeless tobacco: Never  ?Vaping Use  ? Vaping Use: Never used  ?Substance and Sexual Activity  ? Alcohol use: No  ? Drug use: No  ? Sexual activity: Not Currently  ?  Birth control/protection: Post-menopausal, Surgical  ?  Comment: hyst  ?Other Topics Concern  ? Not on file  ?Social History Narrative  ? Lives alone - very close with her neighbors  ? ?Social Determinants of Health  ? ?Financial Resource Strain: Low Risk   ? Difficulty of Paying Living Expenses: Not hard at all  ?Food Insecurity: No Food Insecurity  ? Worried About Charity fundraiser in the Last Year: Never true  ? Ran Out of Food in the Last Year: Never true  ?Transportation Needs: No Transportation Needs  ? Lack of Transportation (Medical): No  ? Lack of Transportation (Non-Medical): No  ?Physical Activity: Sufficiently Active  ? Days of Exercise per Week: 7 days  ? Minutes of Exercise per Session: 30 min  ?Stress: No Stress Concern Present  ? Feeling of Stress : Not at all  ?Social Connections: Moderately Integrated  ? Frequency of Communication with Friends and Family: More than three times a week  ? Frequency of Social Gatherings with Friends and Family: More than three times a week  ? Attends Religious Services: More than 4 times per year  ? Active Member of Clubs or Organizations: Yes  ? Attends Archivist Meetings: More than 4  times per year  ? Marital Status: Divorced  ? ? ?Tobacco Counseling ?Counseling given: Not Answered ? ? ?Clinical Intake: ? ?Pre-visit preparation completed: Yes ? ?Pain : No/denies pain ? ?  ? ?BMI - recorded: 27.92 ?Nutritional Status: BMI 25 -29 Overweight ?Nutritional Risks: None ?Diabetes: No ? ?How often do you need to have someone help you when you read instructions, pamphlets, or other written materials from your doctor or pharmacy?: 1 -  Never ? ?Diabetic? no ? ?Interpreter Needed?: No ? ?Information entered by :: Azlan Hanway, LPN ? ? ?Activities of Daily Living ? ?  08/26/2021  ?  2:41 PM  ?In your present state of health, do you have any difficulty performing the following activities:  ?Hearing? 0  ?Vision? 0  ?Difficulty concentrating or making decisions? 0  ?Walking or climbing stairs? 0  ?Dressing or bathing? 0  ?Doing errands, shopping? 0  ?Preparing Food and eating ? N  ?Using the Toilet? N  ?In the past six months, have you accidently leaked urine? N  ?Do you have problems with loss of bowel control? N  ?Managing your Medications? N  ?Managing your Finances? N  ?Housekeeping or managing your Housekeeping? N  ? ? ?Patient Care Team: ?[name], DO as PCP - General (Family Medicine) ?Vania Rea, MD as Attending Physician (Obstetrics and Gynecology) ?Linus Mako, MD as Consulting Physician (Family Medicine) ?Celestia Khat, OD (Optometry) ? ?Indicate any recent Medical Services you may have received from other than [facility] providers in the past year (date may be approximate). ? ?   ?Assessment:  ? This is a routine wellness examination for April Weber. ? ?Hearing/Vision screen ?Hearing Screening - Comments:: Denies hearing difficulties   ?Vision Screening - Comments:: Wears rx glasses - up to date with routine eye exams with [facility] ? ?Dietary issues and exercise activities discussed: ?Current Exercise Habits: Home exercise routine, Type of exercise: walking, Time (Minutes): 30,  Frequency (Times/Week): 7, Weekly Exercise (Minutes/Week): 210, Intensity: Mild, Exercise limited by: None identified ? ? Goals Addressed   ? ?  ?  ?  ?  ? This Visit's Progress  ?  Patient Stated   On track  ?  Wo

## 2021-09-15 ENCOUNTER — Ambulatory Visit (INDEPENDENT_AMBULATORY_CARE_PROVIDER_SITE_OTHER): Payer: Medicare Other | Admitting: Family Medicine

## 2021-09-15 ENCOUNTER — Other Ambulatory Visit: Payer: Self-pay

## 2021-09-15 ENCOUNTER — Encounter: Payer: Self-pay | Admitting: Family Medicine

## 2021-09-15 VITALS — BP 126/76 | HR 70 | Temp 97.2°F | Ht 66.0 in | Wt 177.2 lb

## 2021-09-15 DIAGNOSIS — N644 Mastodynia: Secondary | ICD-10-CM

## 2021-09-15 NOTE — Progress Notes (Signed)
? ?Subjective: ?CC: Breast pain ?PCP: [name], DO ?RDE:YCXKGYJE April Weber is April 76 y.o. female presenting to clinic today for: ? ?1.  Breast pain ?Patient reports about April 4-week history of April left-sided breast tenderness.  She notes that the skin and nipple are especially tender to touch.  Symptoms are only very slightly better than onset.  She denies any nipple discharge.  She has been performing self breast exams and looking for lymphadenopathy but has not palpated anything unusual.  She has April history of right-sided breast cancer status postmastectomy.  She had mammogram performed back in October which was normal ? ? ?ROS: Per HPI ? ?Allergies  ?Allergen Reactions  ? Codeine   ?  REACTION: makes sick  ? Doxycycline   ?  REACTION: whelps hives  ? Iodine   ?  REACTION: breakout  ? Penicillins   ?  REACTION: whelps hives  ? ?Past Medical History:  ?Diagnosis Date  ? Breast cancer ([place]) 1980  ? RIGHT  ? Chronic kidney disease   ? has one kidney  ? DVT (deep venous thrombosis) ([place])   ? on RIGHT leg-hx of  ? Hyperlipidemia   ? on meds  ? Hypertension   ? on meds  ? Osteopenia   ? Seasonal allergies   ? Thyroid disease   ? on meds  ? ? ?Current Outpatient Medications:  ?  alendronate (FOSAMAX) 70 MG tablet, Take 1 tablet (70 mg total) by mouth every 7 (seven) days. Take with April full glass of water on an empty stomach., Disp: 12 tablet, Rfl: 3 ?  amLODipine (NORVASC) 5 MG tablet, Take 1 tablet (5 mg total) by mouth daily., Disp: 90 tablet, Rfl: 3 ?  aspirin 81 MG EC tablet, Take 81 mg by mouth daily., Disp: , Rfl:  ?  atenolol (TENORMIN) 25 MG tablet, Take 1 tablet (25 mg total) by mouth daily., Disp: 90 tablet, Rfl: 3 ?  Calcium 600-200 MG-UNIT per tablet, Take 1 tablet by mouth 2 (two) times daily., Disp: , Rfl:  ?  levothyroxine (SYNTHROID) 100 MCG tablet, Take 1/2 tablet on Sundays and 1 tablet daily all other days, Disp: 90 tablet, Rfl: 3 ?  loratadine (CLARITIN) 10 MG tablet, Take 10 mg by mouth daily.,  Disp: , Rfl:  ?  losartan (COZAAR) 100 MG tablet, Take 1 tablet (100 mg total) by mouth daily., Disp: 90 tablet, Rfl: 3 ?  Multiple Vitamin (MULTIVITAMIN) capsule, Take 1 capsule by mouth daily., Disp: , Rfl:  ?  simvastatin (ZOCOR) 20 MG tablet, Take 1 tablet (20 mg total) by mouth at bedtime. Cancel rx for '10mg'$ ., Disp: 90 tablet, Rfl: 3 ?  triamcinolone (NASACORT) 55 MCG/ACT AERO nasal inhaler, Place 2 sprays into the nose daily., Disp: , Rfl:  ?Social History  ? ?Socioeconomic History  ? Marital status: Single  ?  Spouse name: Divorced   ? Number of children: 1  ? Years of education: Not on file  ? Highest education level: Not on file  ?Occupational History  ? Occupation: Retired  ?  Comment: worked at [employer] in [place]  ?Tobacco Use  ? Smoking status: Former  ?  Packs/day: 0.25  ?  Types: Cigarettes  ?  Quit date: 05/11/1967  ?  Years since quitting: 54.3  ? Smokeless tobacco: Never  ?Vaping Use  ? Vaping Use: Never used  ?Substance and Sexual Activity  ? Alcohol use: No  ? Drug use: No  ?  Sexual activity: Not Currently  ?  Birth control/protection: Post-menopausal, Surgical  ?  Comment: hyst  ?Other Topics Concern  ? Not on file  ?Social History Narrative  ? Lives alone - very close with her neighbors  ? ?Social Determinants of Health  ? ?Financial Resource Strain: Low Risk   ? Difficulty of Paying Living Expenses: Not hard at all  ?Food Insecurity: No Food Insecurity  ? Worried About Charity fundraiser in the Last Year: Never true  ? Ran Out of Food in the Last Year: Never true  ?Transportation Needs: No Transportation Needs  ? Lack of Transportation (Medical): No  ? Lack of Transportation (Non-Medical): No  ?Physical Activity: Sufficiently Active  ? Days of Exercise per Week: 7 days  ? Minutes of Exercise per Session: 30 min  ?Stress: No Stress Concern Present  ? Feeling of Stress : Not at all  ?Social Connections: Moderately Integrated  ? Frequency of Communication with  Friends and Family: More than three times April week  ? Frequency of Social Gatherings with Friends and Family: More than three times April week  ? Attends Religious Services: More than 4 times per year  ? Active Member of Clubs or Organizations: Yes  ? Attends Archivist Meetings: More than 4 times per year  ? Marital Status: Divorced  ?Intimate Partner Violence: Not At Risk  ? Fear of Current or Ex-Partner: No  ? Emotionally Abused: No  ? Physically Abused: No  ? Sexually Abused: No  ? ?Family History  ?Problem Relation Age of Onset  ? Heart attack Mother   ? Alcohol abuse Mother   ? Brain cancer Mother   ? Breast cancer Maternal Aunt   ? Colon polyps Neg Hx   ? Colon cancer Neg Hx   ? Esophageal cancer Neg Hx   ? Stomach cancer Neg Hx   ? Rectal cancer Neg Hx   ? ? ?Objective: ?Office vital signs reviewed. ?BP (!) 151/81   Pulse 70   Temp (!) 97.2 ?F (36.2 ?C)   Ht '5\' 6"'$  (1.676 m)   Wt 177 lb 3.2 oz (80.4 kg)   SpO2 97%   BMI 28.60 kg/m?  ? ?Physical Examination:  ?General: Awake, alert, nontoxic female, No acute distress ?Breast: Right breast surgically absent, left breast pendulous.  No puckering of the nipple.  No nipple discharge.  No axillary or sternal lymphadenopathy.  No palpable dominant masses.  Some BB sized subareolar well-circumscribed lesions consistent with breast tissue palpated.  These were tender to touch ? ?Assessment/ Plan: ?76 y.o. female  ? ?Breast pain, left - Plan: MM Digital Diagnostic Unilat L, US BREAST COMPLETE UNI LEFT INC AXILLA ? ?No dominant masses appreciated on exam.  She had some BB sized subareolar, well-circumscribed lesions that were tender on exam.  Given her postmenopausal state and personal history of breast cancer on the right status postmastectomy I have placed April urgent order for mammogram and ultrasound of the left breast. ? ?No orders of the defined types were placed in this encounter. ? ?No orders of the defined types were placed in this  encounter. ? ? ? ?[name], DO ?[facility] ?[phone] ? ?

## 2021-09-21 ENCOUNTER — Ambulatory Visit
Admission: RE | Admit: 2021-09-21 | Discharge: 2021-09-21 | Disposition: A | Discharge status: Home / Self Care | Payer: Medicare Other | Source: Ambulatory Visit | Attending: Family Medicine | Admitting: Family Medicine

## 2021-09-21 ENCOUNTER — Other Ambulatory Visit: Payer: Self-pay | Admitting: Family Medicine

## 2021-09-21 DIAGNOSIS — N644 Mastodynia: Secondary | ICD-10-CM | POA: Diagnosis not present

## 2021-09-21 DIAGNOSIS — N6489 Other specified disorders of breast: Secondary | ICD-10-CM

## 2021-09-21 IMAGING — MG MM DIGITAL DIAGNOSTIC UNILAT*L* W/ TOMO W/ CAD
6 of 12 series · 6 of 36 positions shown · non-contrast
Comparison: Previous exam(s).

CLINICAL DATA: Patient presents for diffuse left breast pain.

EXAM:
DIGITAL DIAGNOSTIC UNILATERAL LEFT MAMMOGRAM WITH TOMOSYNTHESIS AND
CAD; ULTRASOUND LEFT BREAST LIMITED
TECHNIQUE: Left digital diagnostic mammography and breast tomosynthesis was
performed. The images were evaluated with computer-aided detection.;
Targeted ultrasound examination of the left breast was performed.

[L ML synth-2D]
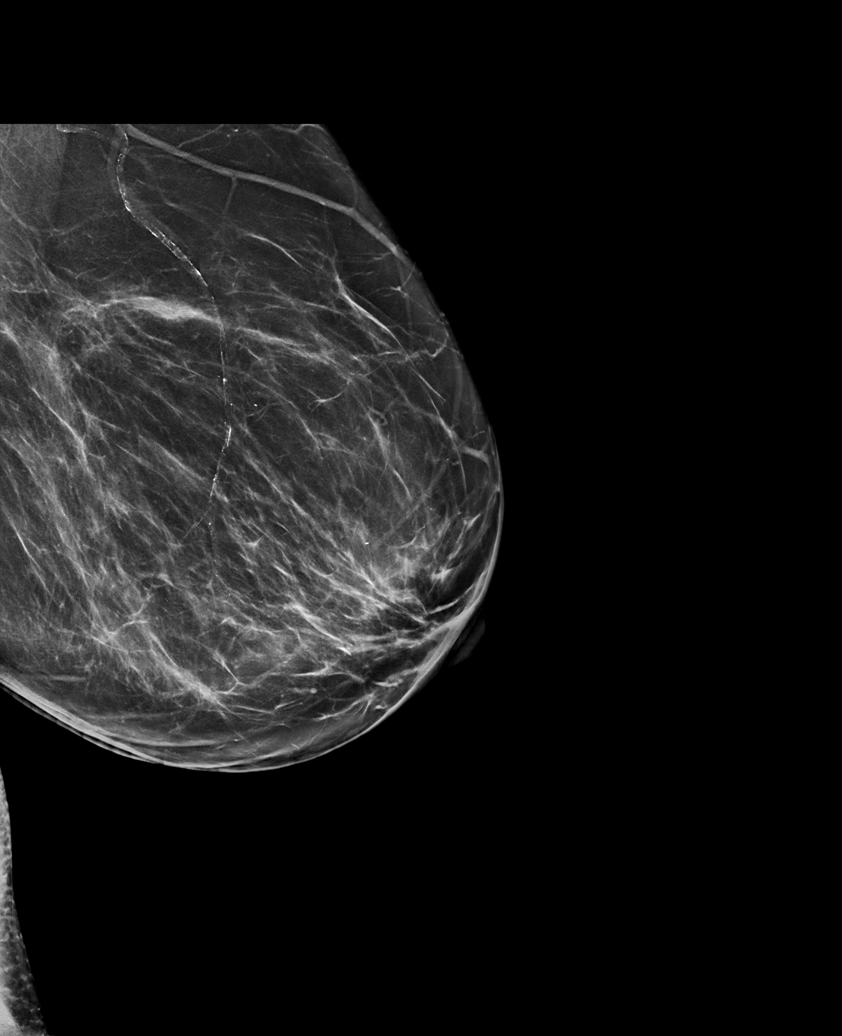

[L CC synth-2D (1 of 4)]
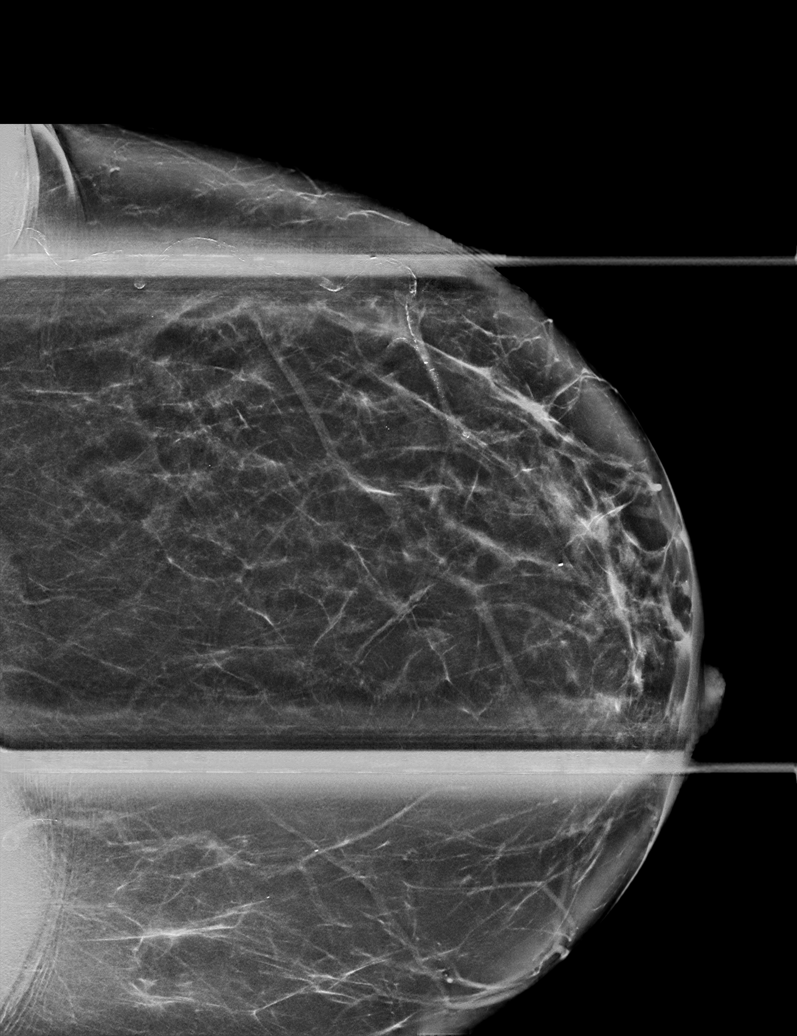

[L CC synth-2D (2 of 4)]
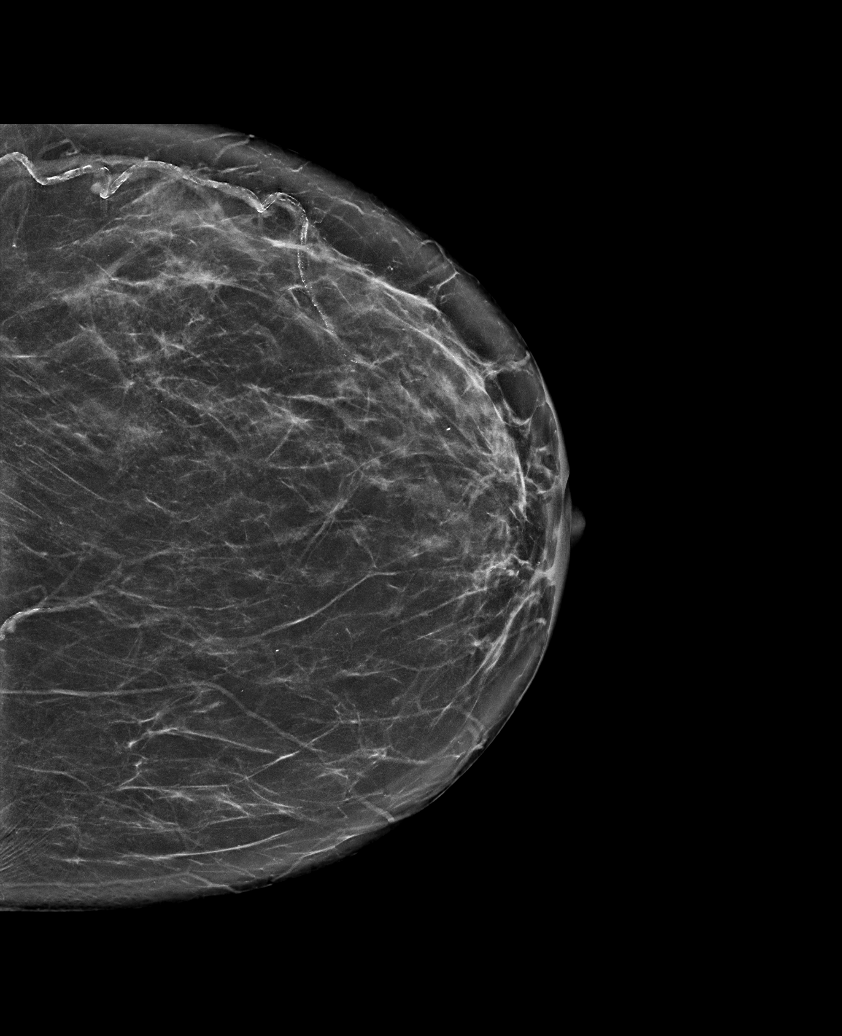

[L CC synth-2D (3 of 4)]
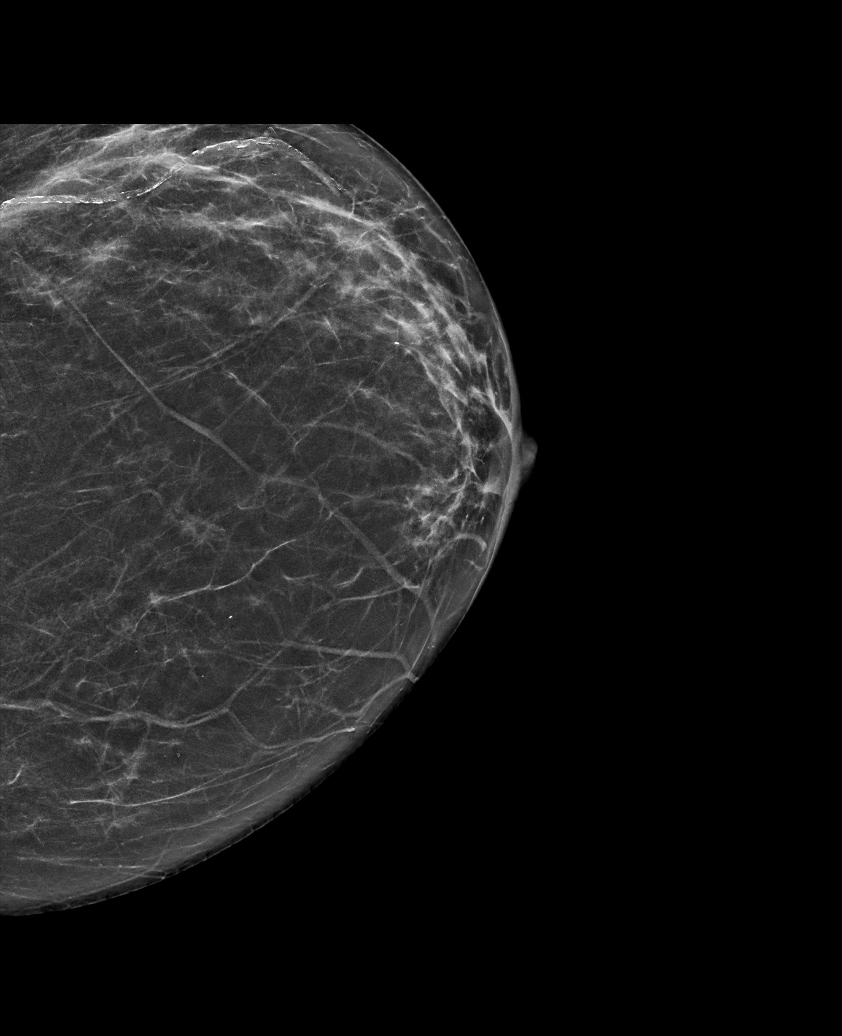

[L CC synth-2D (4 of 4)]
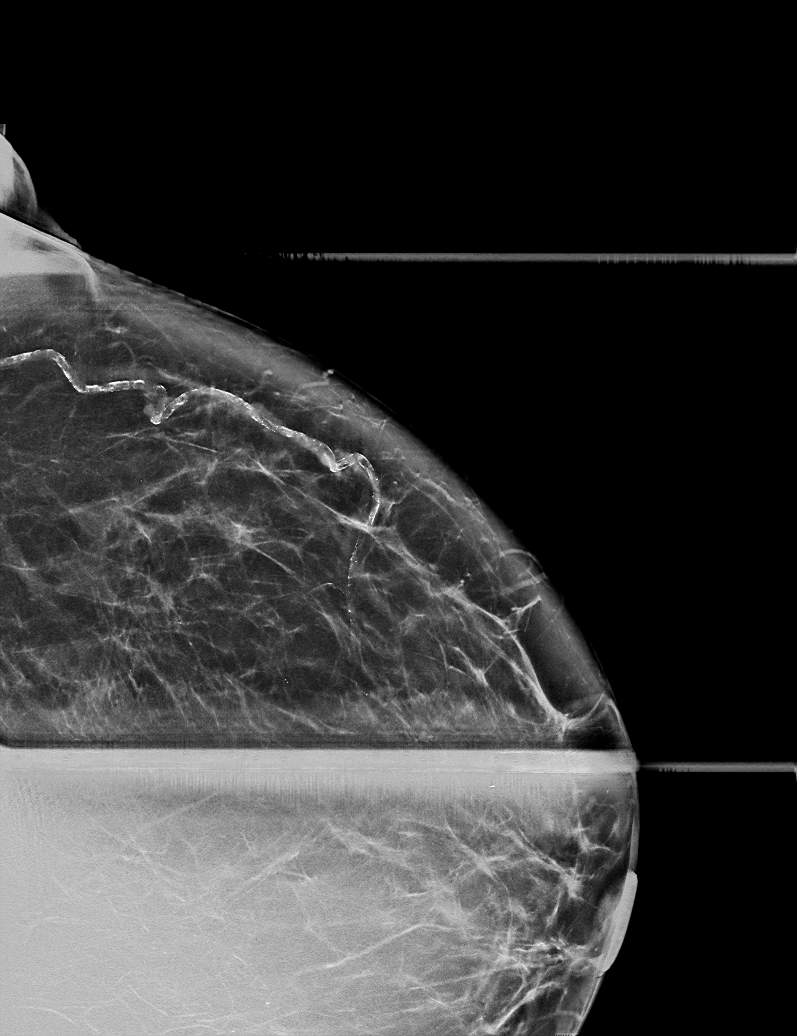

[L MLO synth-2D]
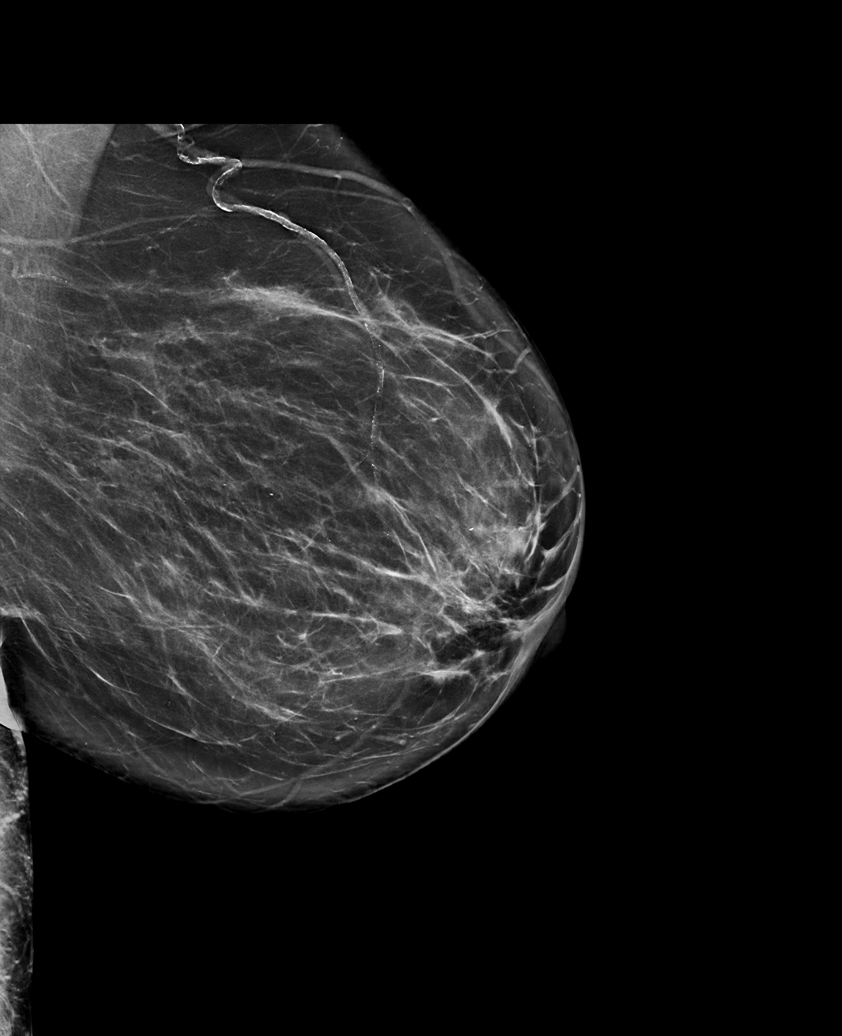

[6 of 36 positions shown; findings below may reference images not displayed]

ACR Breast Density Category b: There are scattered areas of
fibroglandular density.
FINDINGS: There is slight increased density throughout the upper-outer left
breast without distortion or mass on additional imaging. No
additional findings identified within the left breast.

Targeted ultrasound is performed, showing dense tissue without
suspicious mass within the upper-outer left breast.
IMPRESSION: There has been interval increase in density throughout the
upper-outer left breast relative to prior mammograms without
discrete mass or calcifications.

RECOMMENDATION:
Bilateral breast MRI for further evaluation of interval change in
parenchymal density.

Follow-up left breast mammogram in 6 months to reassess the
increased density within the upper-outer left breast, likely
representing dense fibroglandular tissue.

I have discussed the findings and recommendations with the patient.
If applicable, a reminder letter will be sent to the patient
regarding the next appointment.

BI-RADS CATEGORY  3: Probably benign.

## 2021-09-21 IMAGING — US US BREAST*L* LIMITED INC AXILLA
1 series · 5 of 5 positions shown · non-contrast
Comparison: Previous exam(s).

CLINICAL DATA: Patient presents for diffuse left breast pain.

EXAM:
DIGITAL DIAGNOSTIC UNILATERAL LEFT MAMMOGRAM WITH TOMOSYNTHESIS AND
CAD; ULTRASOUND LEFT BREAST LIMITED
TECHNIQUE: Left digital diagnostic mammography and breast tomosynthesis was
performed. The images were evaluated with computer-aided detection.;
Targeted ultrasound examination of the left breast was performed.

[Series 1: us breast*left* limited inc axilla · 0.07mm/px · 5 of 5 slices shown]
[im 1/5]
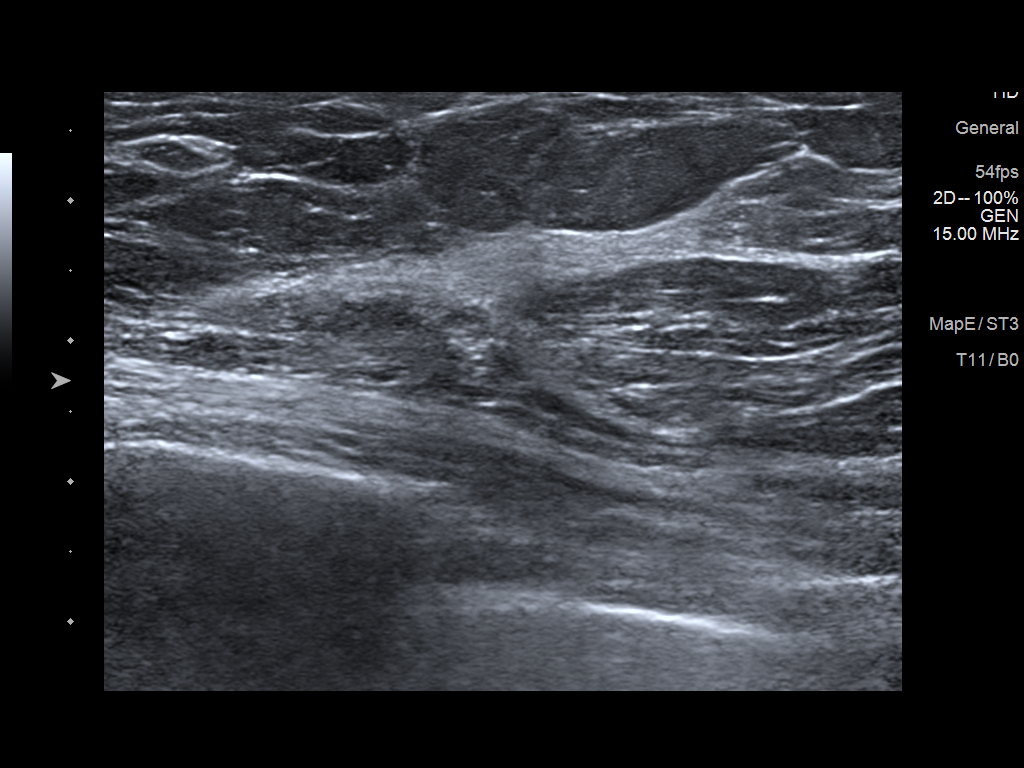
[im 2/5]
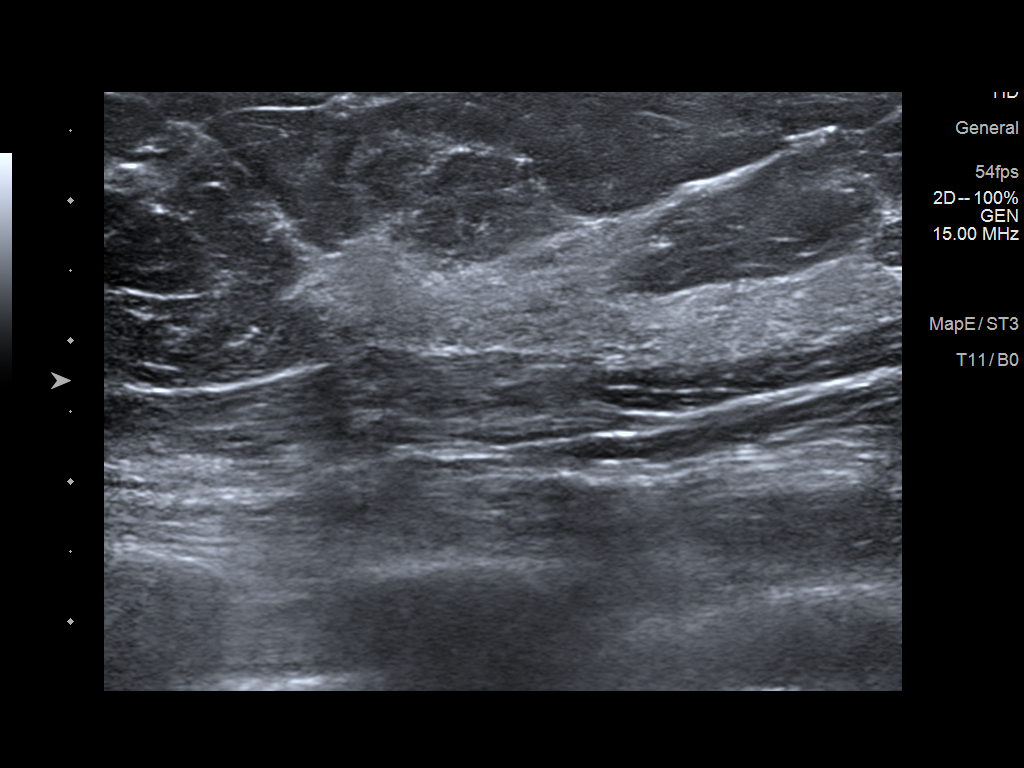
[im 3/5]
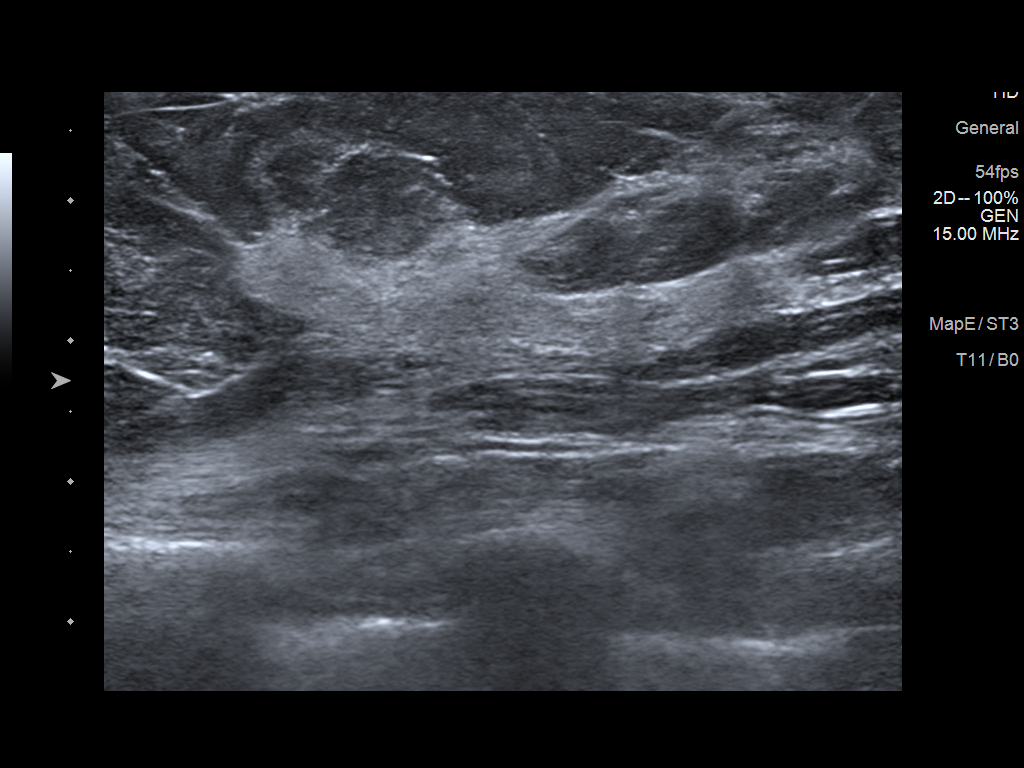
[im 4/5]
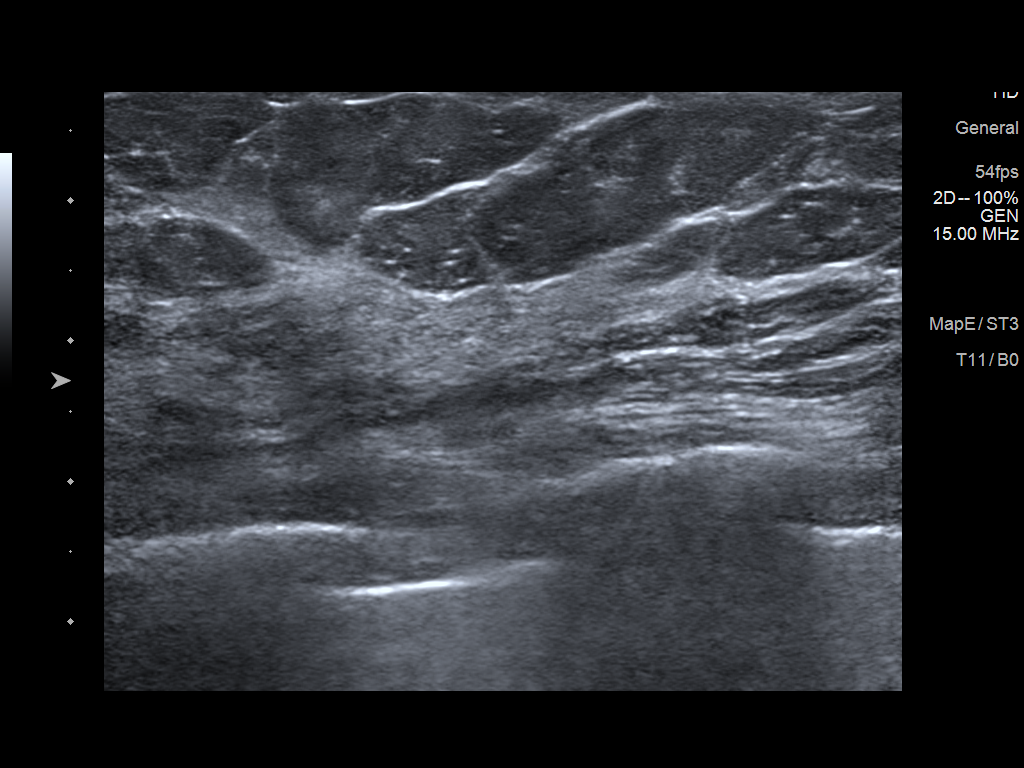
[im 5/5]
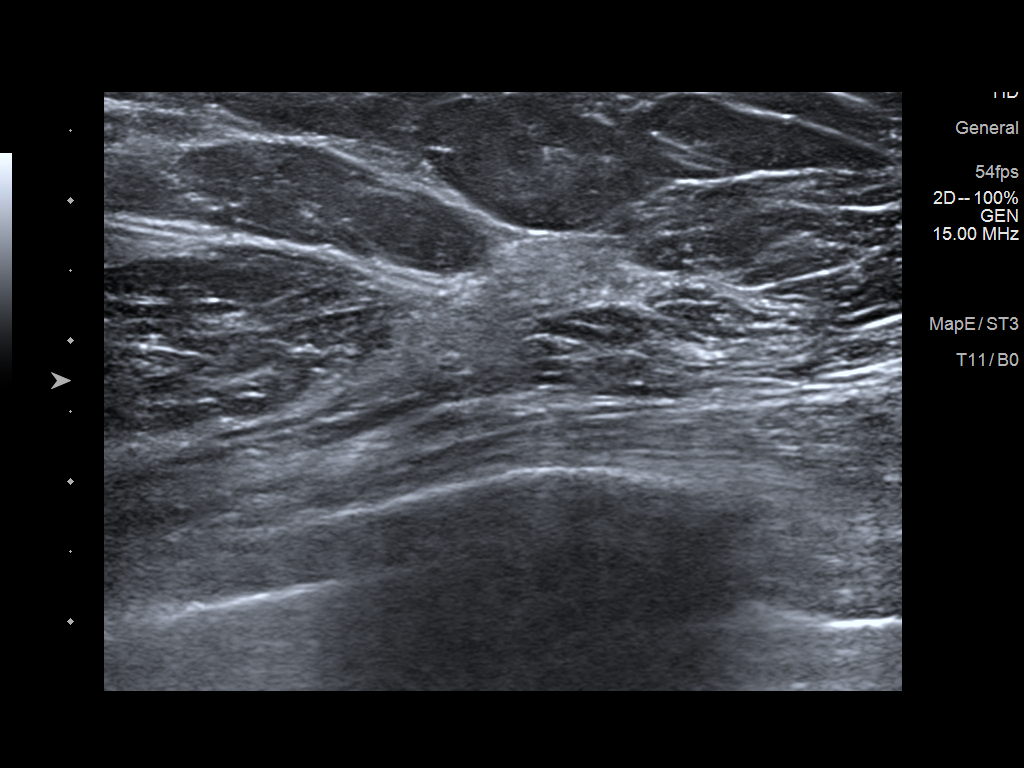

[5 of 5 positions shown; findings below may reference images not displayed]

ACR Breast Density Category b: There are scattered areas of
fibroglandular density.
FINDINGS: There is slight increased density throughout the upper-outer left
breast without distortion or mass on additional imaging. No
additional findings identified within the left breast.

Targeted ultrasound is performed, showing dense tissue without
suspicious mass within the upper-outer left breast.
IMPRESSION: There has been interval increase in density throughout the
upper-outer left breast relative to prior mammograms without
discrete mass or calcifications.

RECOMMENDATION:
Bilateral breast MRI for further evaluation of interval change in
parenchymal density.

Follow-up left breast mammogram in 6 months to reassess the
increased density within the upper-outer left breast, likely
representing dense fibroglandular tissue.

I have discussed the findings and recommendations with the patient.
If applicable, a reminder letter will be sent to the patient
regarding the next appointment.

BI-RADS CATEGORY  3: Probably benign.

## 2021-09-25 ENCOUNTER — Other Ambulatory Visit: Payer: Self-pay

## 2021-09-25 DIAGNOSIS — N644 Mastodynia: Secondary | ICD-10-CM

## 2021-09-25 DIAGNOSIS — R928 Other abnormal and inconclusive findings on diagnostic imaging of breast: Secondary | ICD-10-CM

## 2021-09-28 NOTE — Progress Notes (Signed)
ORDER HAS BEEN PLACED AND APPT HAS BEEN MADE

## 2021-10-11 ENCOUNTER — Ambulatory Visit
Admission: RE | Admit: 2021-10-11 | Discharge: 2021-10-11 | Disposition: A | Discharge status: Home / Self Care | Payer: Medicare Other | Source: Ambulatory Visit | Attending: Family Medicine | Admitting: Family Medicine

## 2021-10-11 DIAGNOSIS — N644 Mastodynia: Secondary | ICD-10-CM | POA: Diagnosis not present

## 2021-10-11 DIAGNOSIS — R928 Other abnormal and inconclusive findings on diagnostic imaging of breast: Secondary | ICD-10-CM

## 2021-10-11 DIAGNOSIS — Z853 Personal history of malignant neoplasm of breast: Secondary | ICD-10-CM | POA: Diagnosis not present

## 2021-10-11 IMAGING — MR MR BREAST BILAT WO/W CM
8 of 12 series · 32 of 48 positions shown · IV contrast (8 ml gadavist)
Comparison: None Available.

CLINICAL DATA: 75-year-old with diffuse left breast pain and new
change in left breast density on recent mammography. Patient has a
history of right breast cancer status post mastectomy in [F4].

EXAM:
BILATERAL BREAST MRI WITH AND WITHOUT CONTRAST
TECHNIQUE: Multiplanar, multisequence MR images of both breasts were obtained
prior to and following the intravenous administration of 8 ml of
Gadavist

[Series 2: t2_tirm_tra ipat (a-p) · axial · 3.0mm · 0.78mm/px · 1 of 55 slices shown]
[im 1/55]
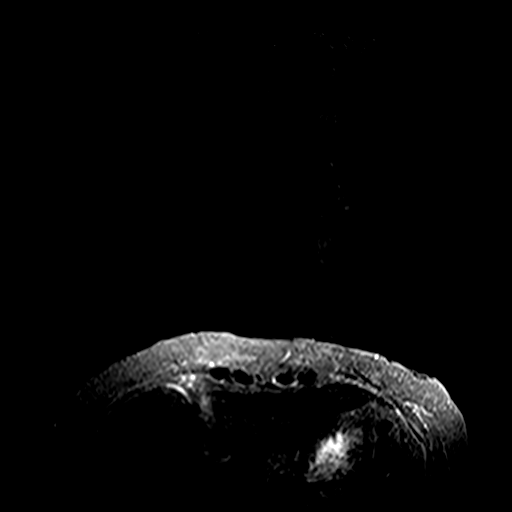

[Series 3: fl3d pre-cm no · axial · non-contrast · 1.2mm · 1.04mm/px · z∈[-111,+60]mm · 5 of 144 slices shown]
[im 1/144]
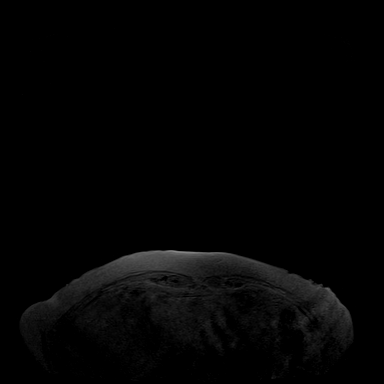
[im 36/144]
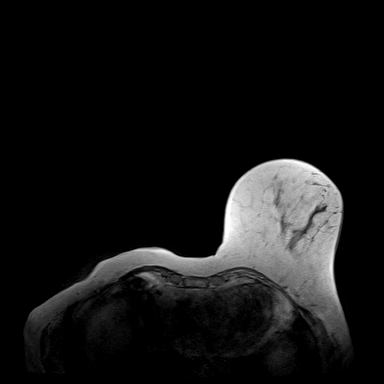
[im 72/144]
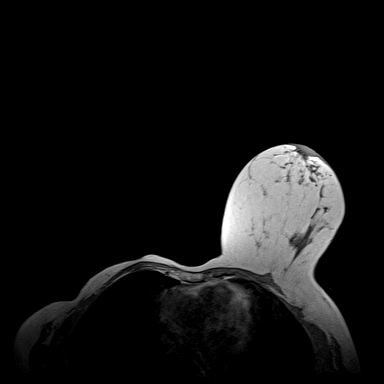
[im 108/144]
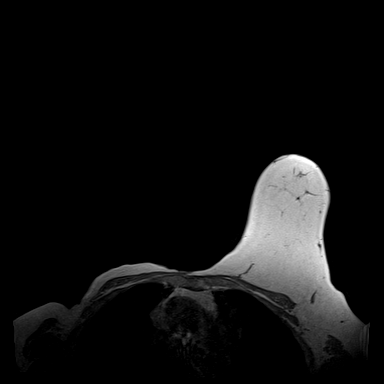
[im 144/144]
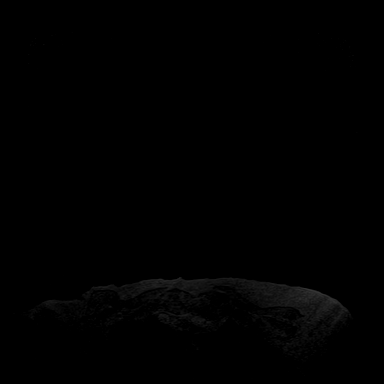

[Series 4: fl3d pre-cm · axial · non-contrast · 1.2mm · 1.04mm/px · z∈[-111,+60]mm · 5 of 144 slices shown]
[im 1/144]
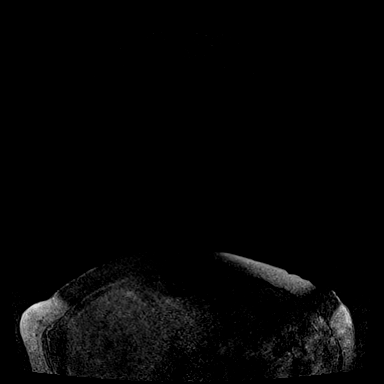
[im 36/144]
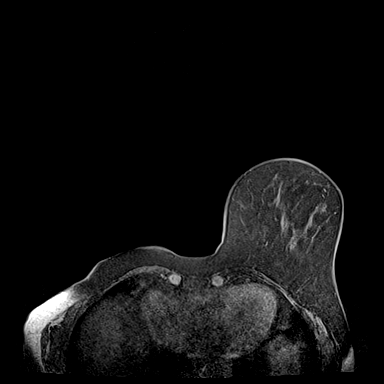
[im 72/144]
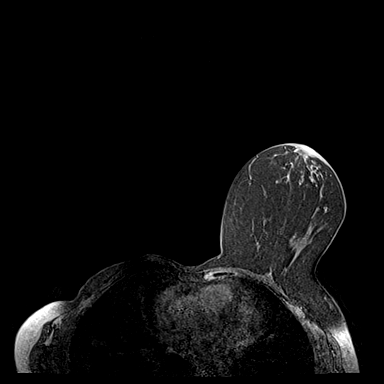
[im 108/144]
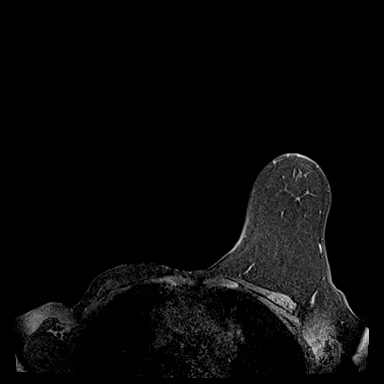
[im 144/144]
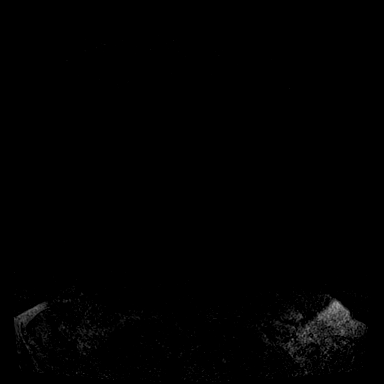

[Series 5: fl3d post-cm 20 · axial · 1.2mm · 1.04mm/px · z∈[-111,+60]mm · 5 of 144 slices shown (1 of 3)]
[im 1/144]
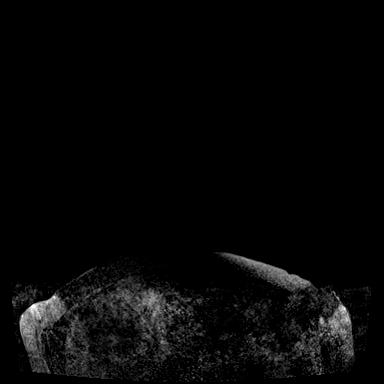
[im 36/144]
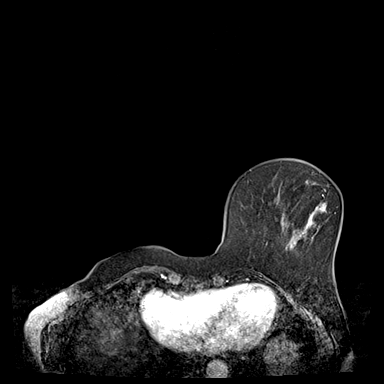
[im 72/144]
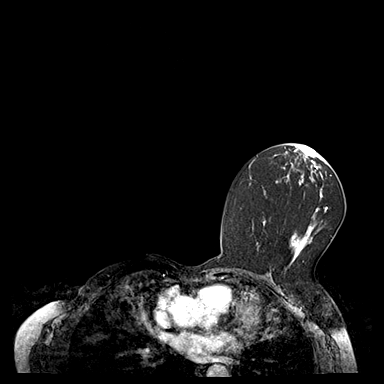
[im 108/144]
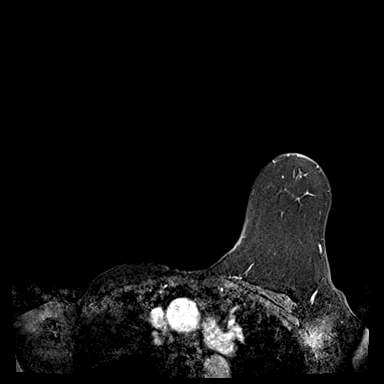
[im 144/144]
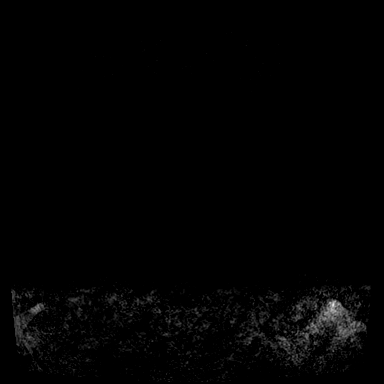

[Series 6: fl3d post-cm 20 · axial · 1.2mm · 1.04mm/px · z∈[-111,+60]mm · 5 of 144 slices shown (2 of 3)]
[im 1/144]
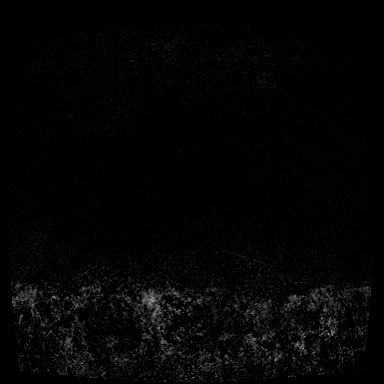
[im 36/144]
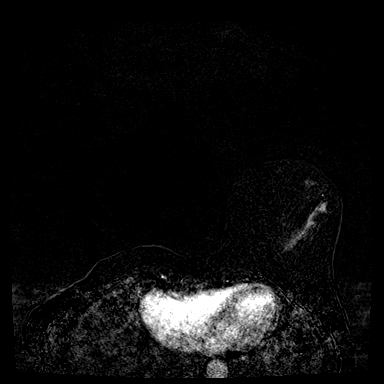
[im 72/144]
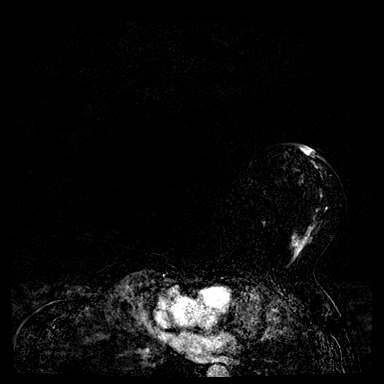
[im 108/144]
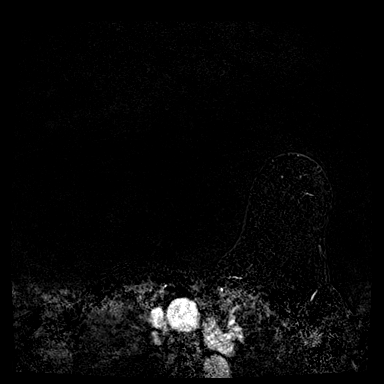
[im 144/144]
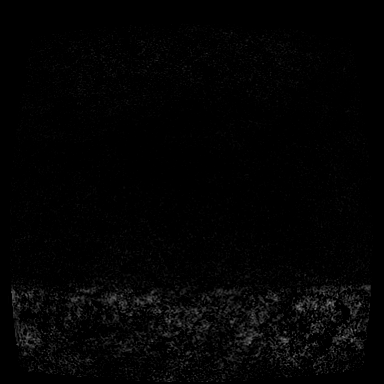

[Series 7: fl3d post-cm 20 · axial · 172.8mm · 1.04mm/px · 1 of 1 slices shown (3 of 3)]
[im 1/1]
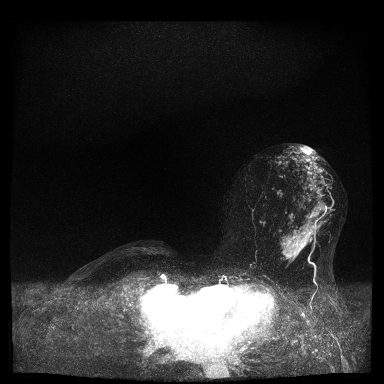

[Series 8: fl3d post-cm 3 · axial · 1.2mm · 1.04mm/px · z∈[-111,+60]mm · 6 of 144 slices shown (1 of 2)]
[im 1/144]
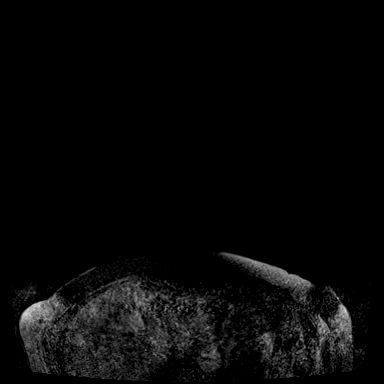
[im 29/144]
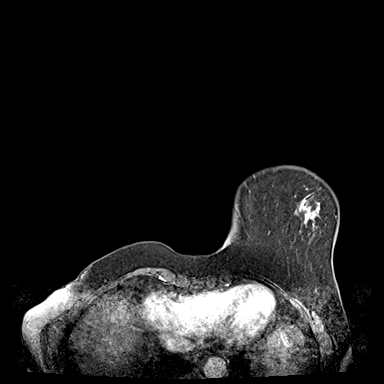
[im 58/144]
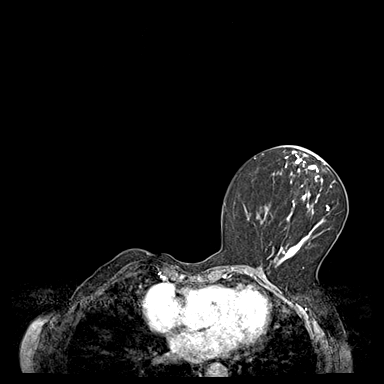
[im 86/144]
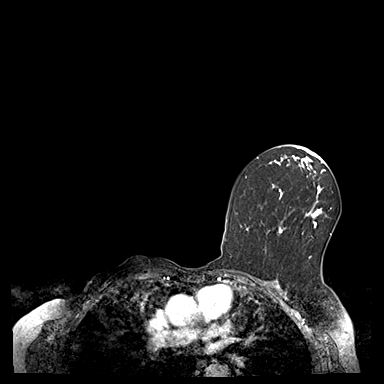
[im 115/144]
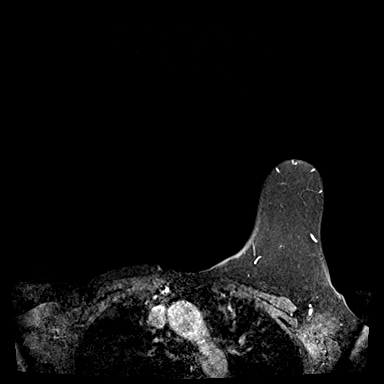
[im 144/144]
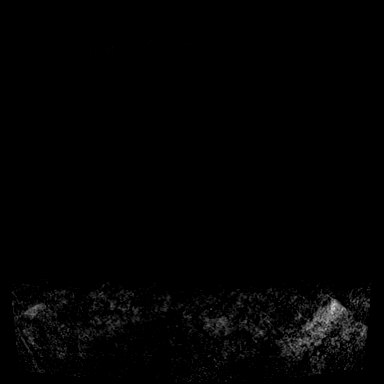

[Series 9: fl3d post-cm 3 · axial · 1.2mm · 1.04mm/px · z∈[-111,-9]mm · 4 of 144 slices shown (2 of 2)]
[im 1/144]
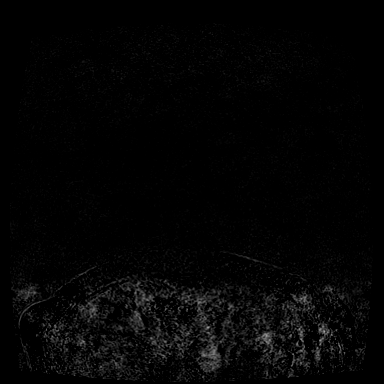
[im 29/144]
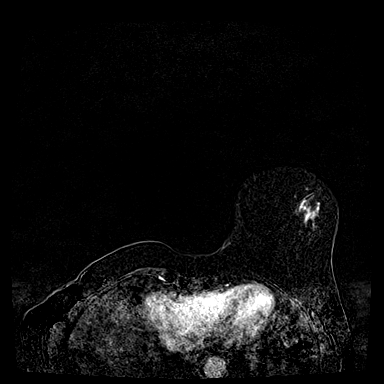
[im 58/144]
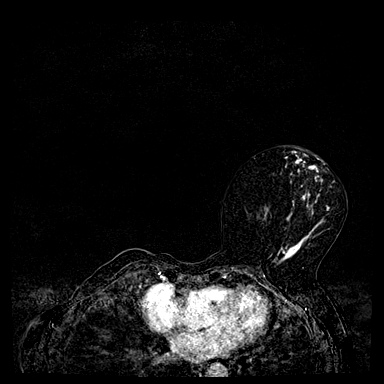
[im 86/144]
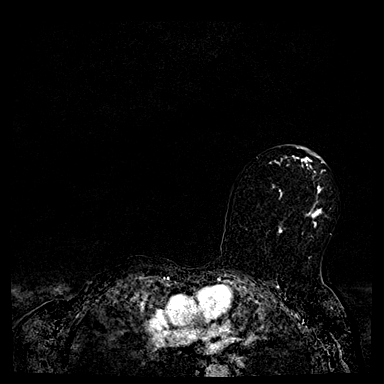

[32 of 48 positions shown; findings below may reference images not displayed]

Three-dimensional MR images were rendered by post-processing of the
original MR data on an independent workstation. The
three-dimensional MR images were interpreted, and findings are
reported in the following complete MRI report for this study. Three
dimensional images were evaluated at the independent interpreting
workstation using the DynaCAD thin client.
FINDINGS: Breast composition: b. Scattered fibroglandular tissue.

Background parenchymal enhancement: Moderate.

Right breast: Status post mastectomy. No abnormal enhancement in the
right chest wall.

Left breast: There is linear and clumped non mass enhancement
extending throughout the outer to central portion of the left breast
from the chest wall to the nipple. The overall extent is best
visualized on the MIP images and measures approximately 11.7 x
cm. The non-mass enhancement is most focal in the lower slightly
outer left breast (series 9, image 98).

Lymph nodes: No abnormal appearing lymph nodes.

Ancillary findings:  None.
IMPRESSION: 1. Linear and clumped non mass enhancement extending throughout the
outer to central left breast from the chest wall to the nipple. The
overall extent is best visualized on the MIP images and measures
approximately 11.7 x 6.4 cm.
2. Status post right mastectomy.
3. No suspicious lymphadenopathy.

RECOMMENDATION:
MRI guided biopsy x2 of the left breast targeting the posterior
inferior and anterior retroareolar aspects of the non mass
enhancement.

BI-RADS CATEGORY  4: Suspicious.

## 2021-10-11 MED ORDER — GADOBUTROL 1 MMOL/ML IV SOLN
8.0000 mL | Freq: Once | INTRAVENOUS | Status: AC | PRN
  Administered 2021-10-11: 8 mL via INTRAVENOUS

## 2021-10-13 ENCOUNTER — Other Ambulatory Visit: Payer: Self-pay | Admitting: Family Medicine

## 2021-10-13 DIAGNOSIS — R9389 Abnormal findings on diagnostic imaging of other specified body structures: Secondary | ICD-10-CM

## 2021-10-19 ENCOUNTER — Ambulatory Visit
Admission: RE | Admit: 2021-10-19 | Discharge: 2021-10-19 | Disposition: A | Discharge status: Home / Self Care | Payer: Medicare Other | Source: Ambulatory Visit | Attending: Family Medicine | Admitting: Family Medicine

## 2021-10-19 ENCOUNTER — Other Ambulatory Visit (HOSPITAL_COMMUNITY): Payer: Self-pay | Admitting: Diagnostic Radiology

## 2021-10-19 DIAGNOSIS — R9389 Abnormal findings on diagnostic imaging of other specified body structures: Secondary | ICD-10-CM

## 2021-10-19 DIAGNOSIS — R928 Other abnormal and inconclusive findings on diagnostic imaging of breast: Secondary | ICD-10-CM | POA: Diagnosis not present

## 2021-10-19 DIAGNOSIS — N6012 Diffuse cystic mastopathy of left breast: Secondary | ICD-10-CM | POA: Diagnosis not present

## 2021-10-19 IMAGING — MR MR BREAST BX W LOC DEV EA ADD LESION IMAGE BX SPEC MR GUIDE*L*
7 of 10 series · 30 of 48 positions shown · IV contrast (7 ml gadavist)
Comparison: With priors.
COMPARISON: With priors.

Addendum:
CLINICAL DATA: Abnormal enhancement in the left breast. MR guided
core biopsies of the posteroinferior and anterior retroareolar
enhancement recommended.

EXAM:
MRI GUIDED CORE NEEDLE BIOPSY OF THE LEFT BREAST
TECHNIQUE: Multiplanar, multisequence MR imaging of the left breast was
performed both before and after administration of intravenous
contrast.
CONTRAST:  7 mL of Gadavist.

[Series 2: fiducial unilateral · sagittal · 2.0mm · 1.33mm/px · 3 of 52 slices shown]
[im 1/52]
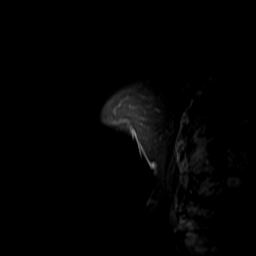
[im 26/52]
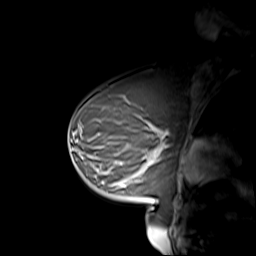
[im 52/52]
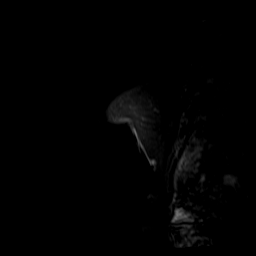

[Series 3: dynamic pre · axial · non-contrast · 1.3mm · 0.73mm/px · z∈[-63,+122]mm · 5 of 144 slices shown]
[im 1/144]
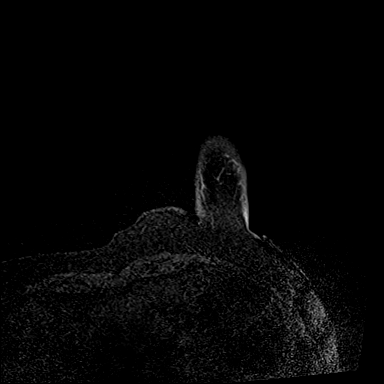
[im 36/144]
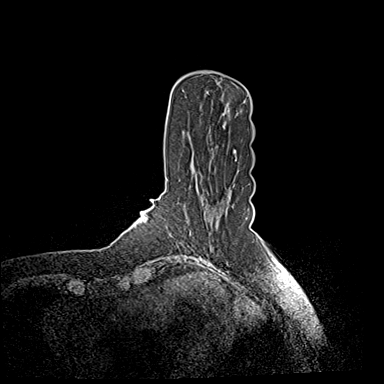
[im 72/144]
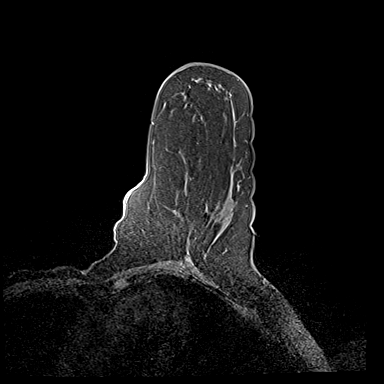
[im 108/144]
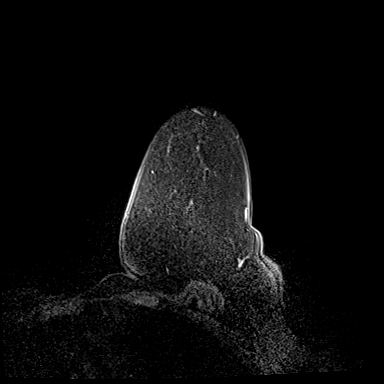
[im 144/144]
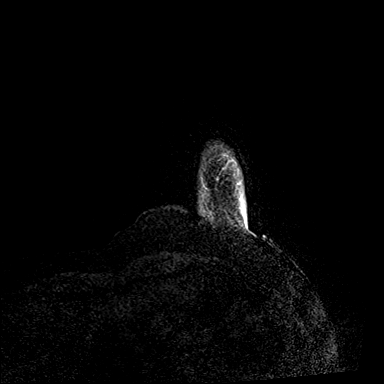

[Series 4: dynamic post 20 · axial · 1.3mm · 0.73mm/px · z∈[-63,+122]mm · 5 of 144 slices shown (1 of 2)]
[im 1/144]
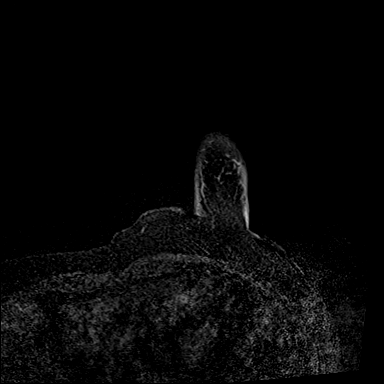
[im 36/144]
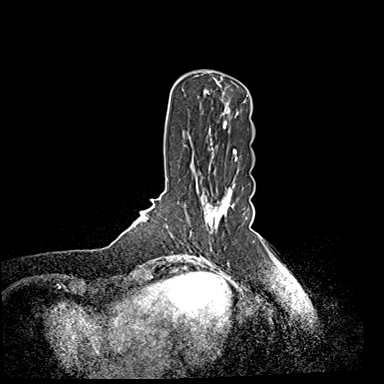
[im 72/144]
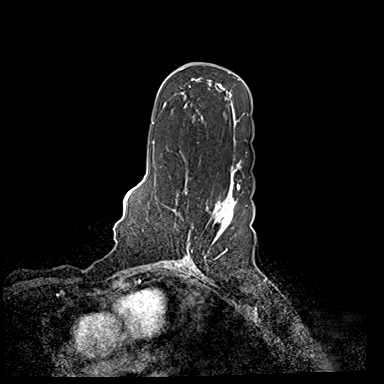
[im 108/144]
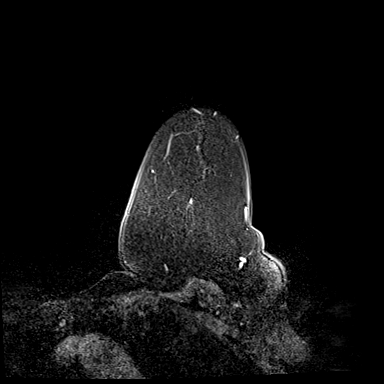
[im 144/144]
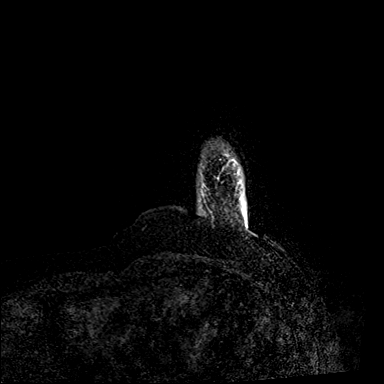

[Series 5: dynamic post 20 · axial · 1.3mm · 0.73mm/px · z∈[-63,+122]mm · 5 of 144 slices shown (2 of 2)]
[im 1/144]
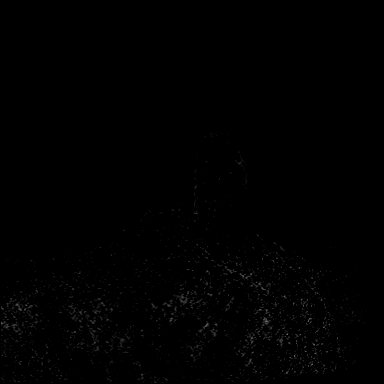
[im 36/144]
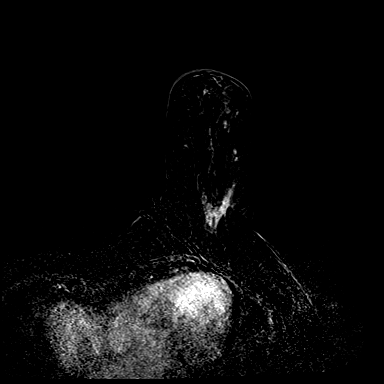
[im 72/144]
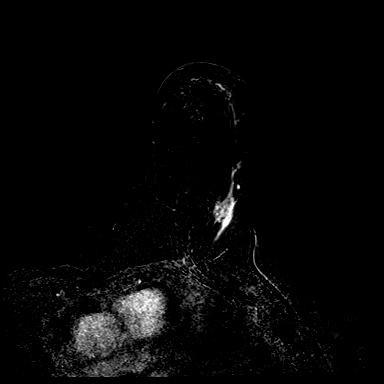
[im 108/144]
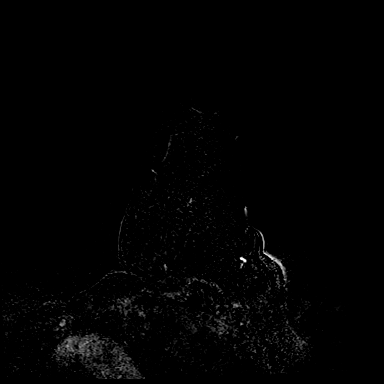
[im 144/144]
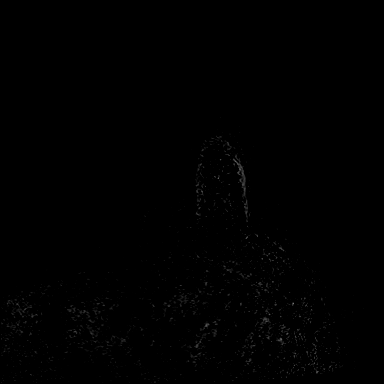

[Series 6: dynamic post 3 · axial · 1.3mm · 0.73mm/px · z∈[-63,+122]mm · 5 of 144 slices shown (1 of 2)]
[im 1/144]
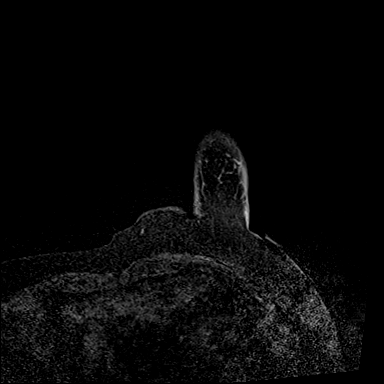
[im 36/144]
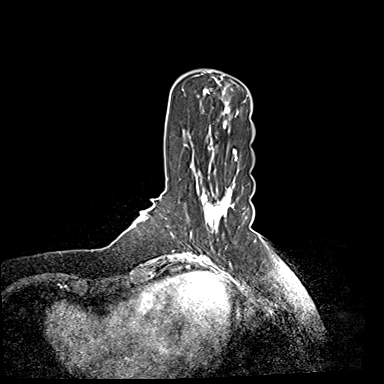
[im 72/144]
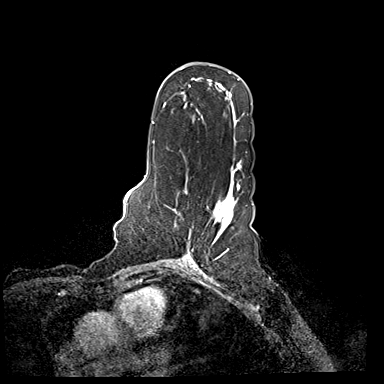
[im 108/144]
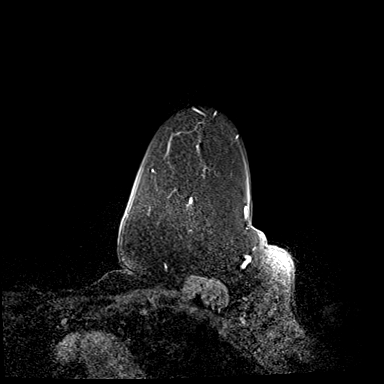
[im 144/144]
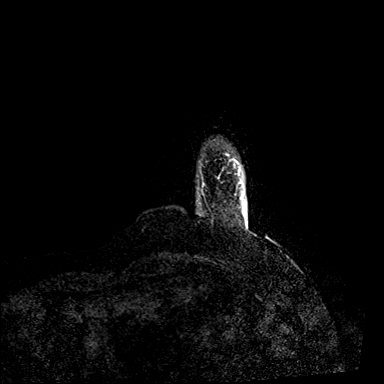

[Series 7: dynamic post 3 · axial · 1.3mm · 0.73mm/px · z∈[-63,+122]mm · 5 of 144 slices shown (2 of 2)]
[im 1/144]
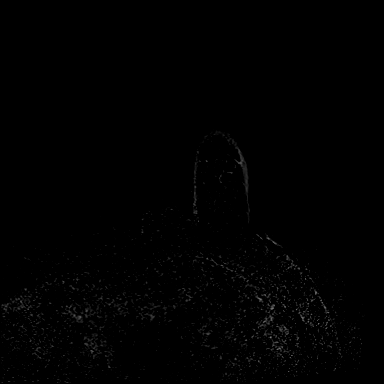
[im 36/144]
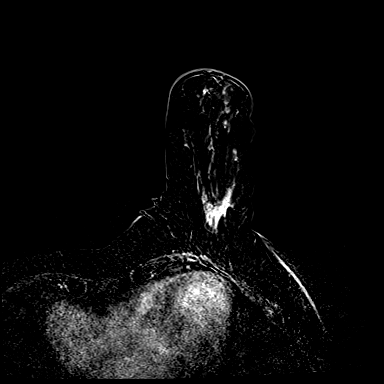
[im 72/144]
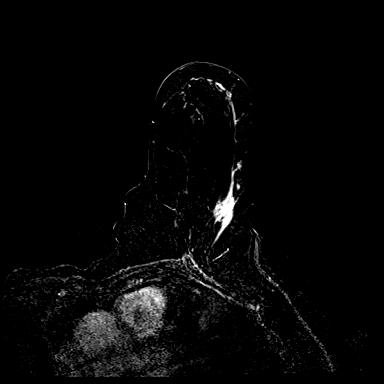
[im 108/144]
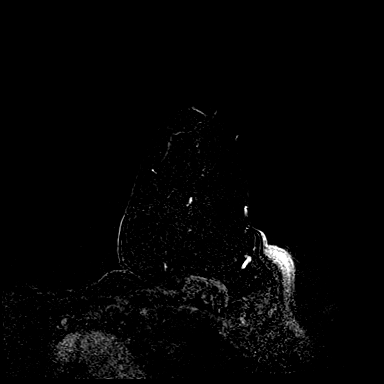
[im 144/144]
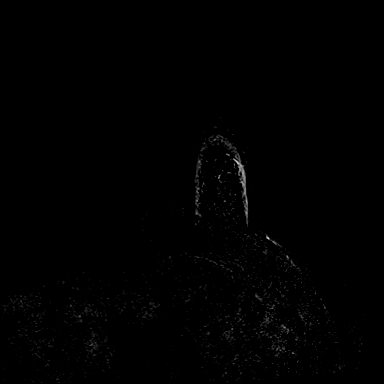

[Series 8: needle confirmation · axial · 1.3mm · 0.73mm/px · z∈[-63,-18]mm · 2 of 144 slices shown]
[im 1/144]
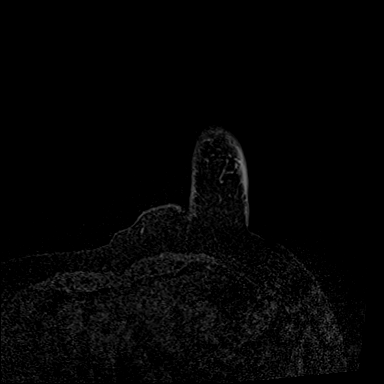
[im 36/144]
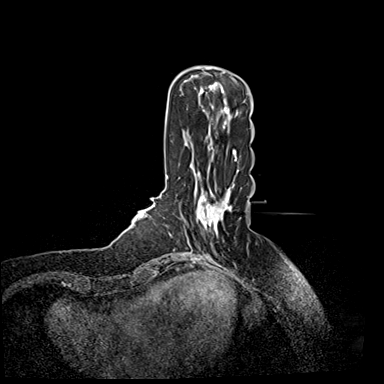

[30 of 48 positions shown; findings below may reference images not displayed]

FINDINGS: I met with the patient, and we discussed the procedure of MRI guided
biopsy, including risks, benefits, and alternatives. Specifically,
we discussed the risks of infection, bleeding, tissue injury, clip
migration, and inadequate sampling. Informed, written consent was
given. The usual time out protocol was performed immediately prior
to the procedure.

Using sterile technique, 1% Lidocaine, MRI guidance, and a 9 gauge
vacuum assisted device, biopsy was performed of enhancement in the
posterior/inferior region of the left breast using a lateral to
medial approach. At the conclusion of the procedure, a barbell
tissue marker clip was deployed into the biopsy cavity. Follow-up
2-view mammogram was performed and dictated separately.

I met with the patient, and we discussed the procedure of MRI guided
biopsy, including risks, benefits, and alternatives. Specifically,
we discussed the risks of infection, bleeding, tissue injury, clip
migration, and inadequate sampling. Informed, written consent was
given. The usual time out protocol was performed immediately prior
to the procedure.

Using sterile technique, 1% Lidocaine, MRI guidance, and a 9 gauge
vacuum assisted device, biopsy was performed of enhancement in the
anterior retroareolar region of the left breast using a lateral to
medial approach. At the conclusion of the procedure, a cylinder
tissue marker clip was deployed into the biopsy cavity. Follow-up
2-view mammogram was performed and dictated separately.
IMPRESSION: MRI guided biopsies of the left breast.  No apparent complications.

ADDENDUM:
Pathology revealed SMALL RADIAL SCLEROSING LESION WITH
CALCIFICATIONS, BACKGROUND GYNECOMASTOID HYPERPLASIA AND FIBROCYSTIC
CHANGES INCLUDING USUAL DUCT HYPERPLASIA, COLUMNAR CELL CHANGE of
the LEFT breast, posterior/inferior, (barbell shaped clip). This was
found to be concordant by Dr. NAZARETH, with surgical consultation
for consideration of excision recommended.

Pathology revealed BENIGN BREAST, PREDOMINANTLY MATURE ADIPOSE, WITH
GYNECOMASTOID HYPERPLASIA AND FIBROCYSTIC CHANGES INCLUDING USUAL
DUCT HYPERPLASIA, COLUMNAR CELL CHANGE of the LEFT breast, anterior
retroareolar, (cylinder clip). This was found to be concordant by
Dr. NAZARETH.

Pathology results were discussed with the patient by telephone. The
patient reported doing well after the biopsies with tenderness at
the sites. Post biopsy instructions and care were reviewed and
questions were answered. The patient was encouraged to call The
direct phone number was provided.

Surgical consultation has been arranged with Dr. NAZARETH at
[REDACTED] on [DATE].

The patient was instructed to return for a bilateral breast MRI in 6
months.

Pathology results reported by NAZARETH, RN on [DATE].

*** End of Addendum ***
FINDINGS: I met with the patient, and we discussed the procedure of MRI guided
biopsy, including risks, benefits, and alternatives. Specifically,
we discussed the risks of infection, bleeding, tissue injury, clip
migration, and inadequate sampling. Informed, written consent was
given. The usual time out protocol was performed immediately prior
to the procedure.

Using sterile technique, 1% Lidocaine, MRI guidance, and a 9 gauge
vacuum assisted device, biopsy was performed of enhancement in the
posterior/inferior region of the left breast using a lateral to
medial approach. At the conclusion of the procedure, a barbell
tissue marker clip was deployed into the biopsy cavity. Follow-up
2-view mammogram was performed and dictated separately.

I met with the patient, and we discussed the procedure of MRI guided
biopsy, including risks, benefits, and alternatives. Specifically,
we discussed the risks of infection, bleeding, tissue injury, clip
migration, and inadequate sampling. Informed, written consent was
given. The usual time out protocol was performed immediately prior
to the procedure.

Using sterile technique, 1% Lidocaine, MRI guidance, and a 9 gauge
vacuum assisted device, biopsy was performed of enhancement in the
anterior retroareolar region of the left breast using a lateral to
medial approach. At the conclusion of the procedure, a cylinder
tissue marker clip was deployed into the biopsy cavity. Follow-up
2-view mammogram was performed and dictated separately.
IMPRESSION: MRI guided biopsies of the left breast.  No apparent complications.

## 2021-10-19 IMAGING — MG MM BREAST LOCALIZATION CLIP
4 series · 4 of 12 positions shown · non-contrast
Comparison: Previous exam(s).

CLINICAL DATA: Status post MR guided core biopsies of the left
breast.

EXAM:
3D DIAGNOSTIC LEFT MAMMOGRAM POST MRI BIOPSIES

[L ML synth-2D]
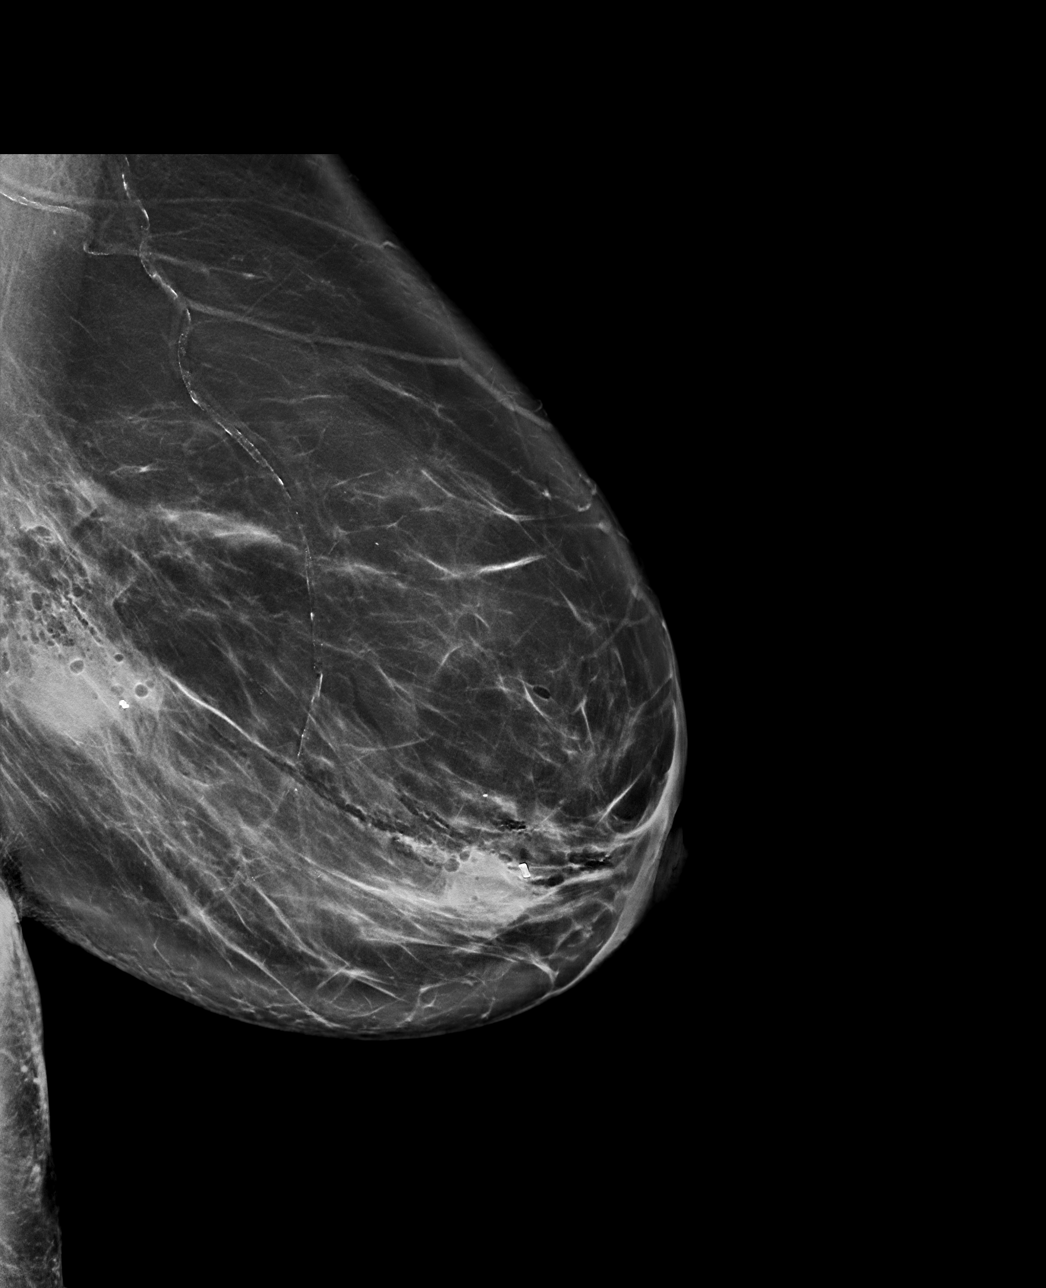

[L CC synth-2D]
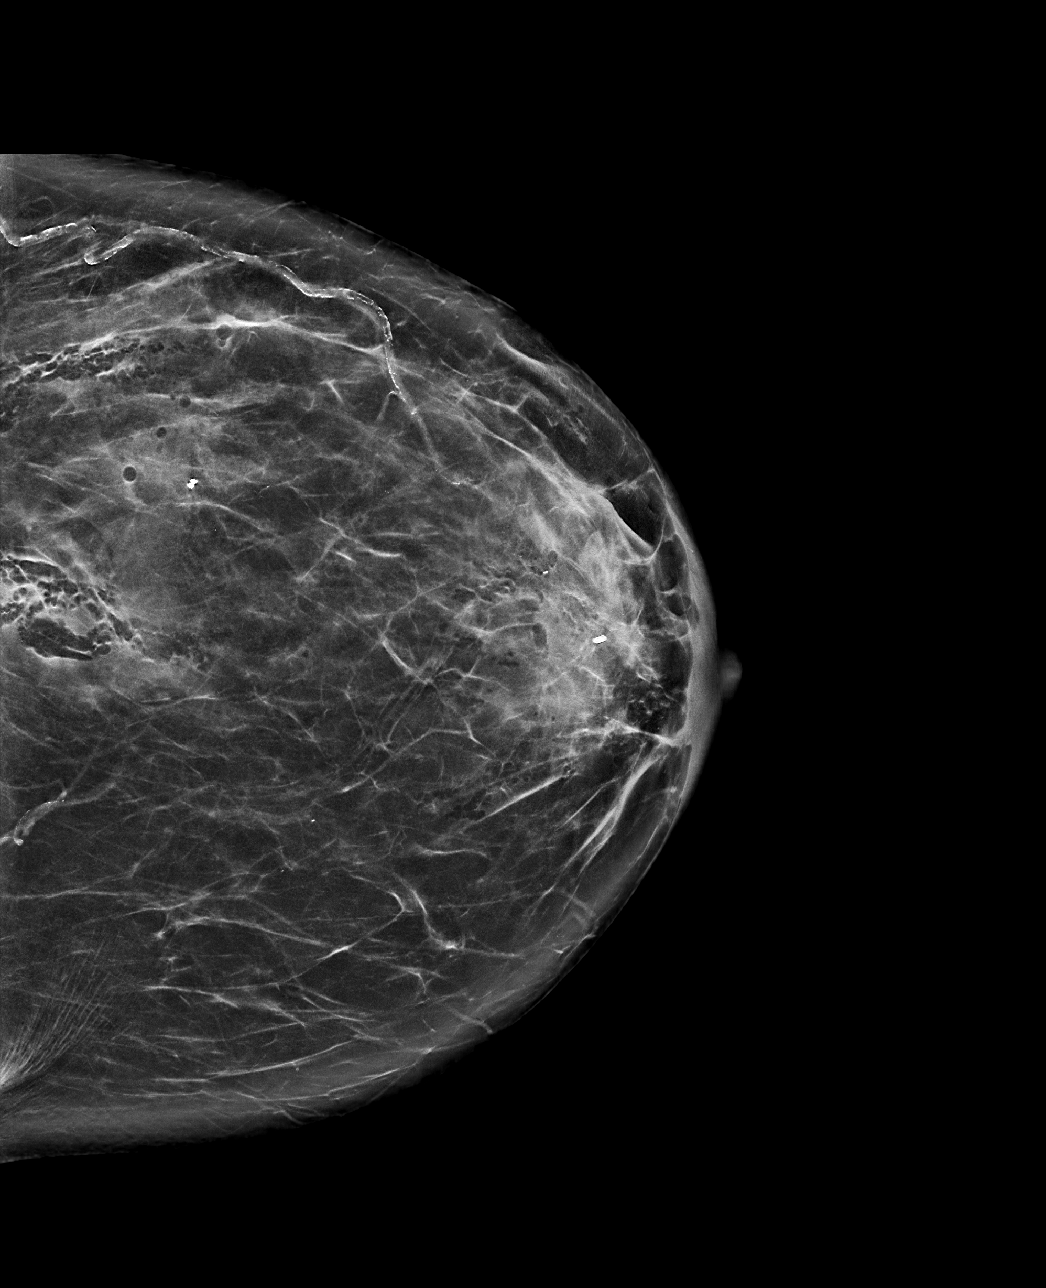

[L ML tomo · tomo slice 49/98.0]
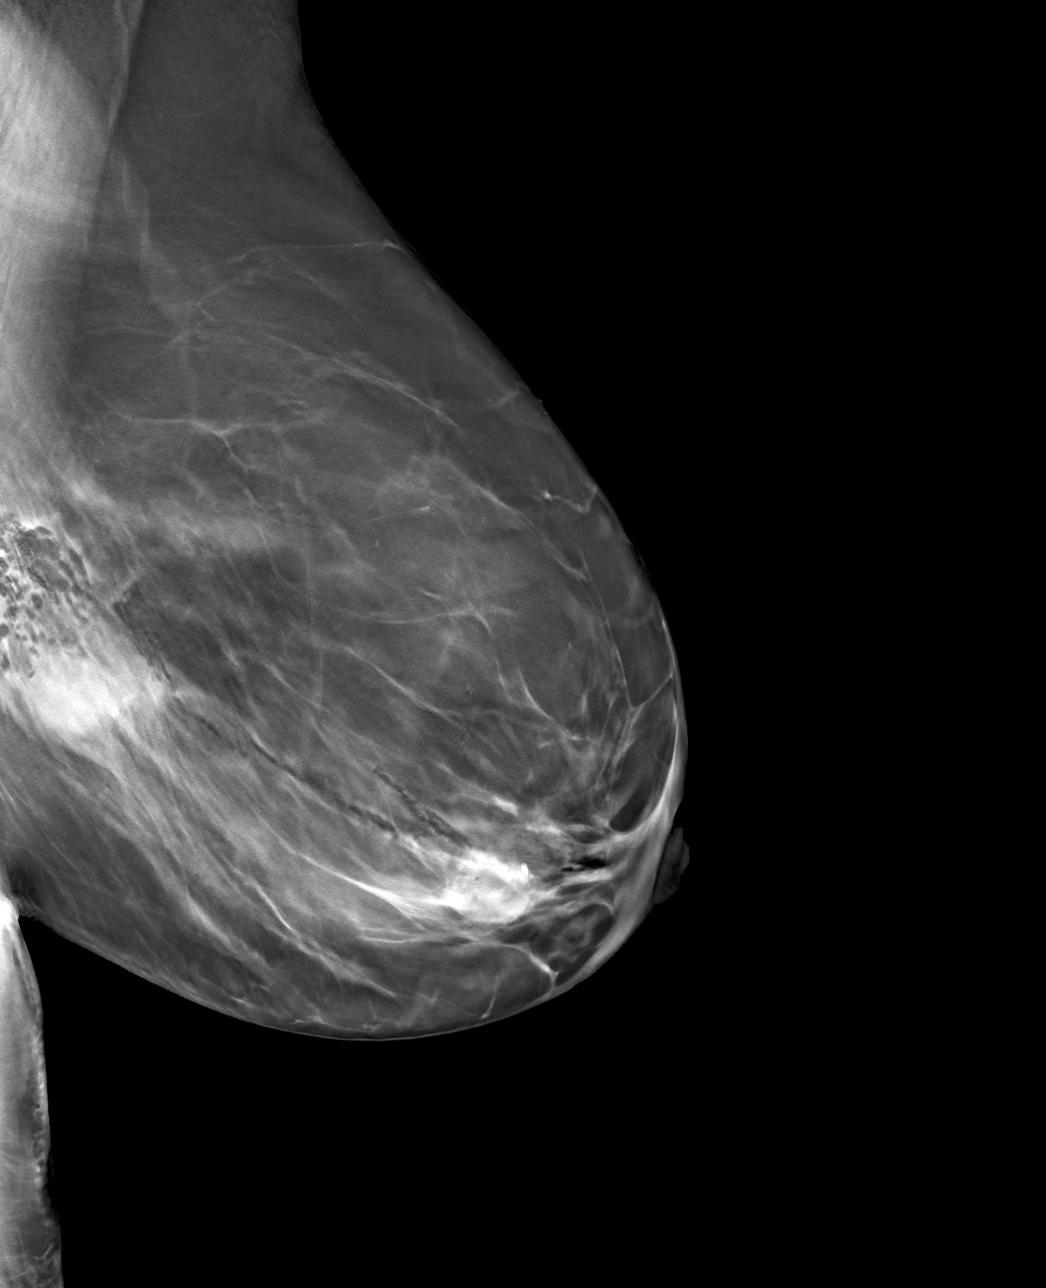

[L CC tomo · tomo slice 41/81.0]
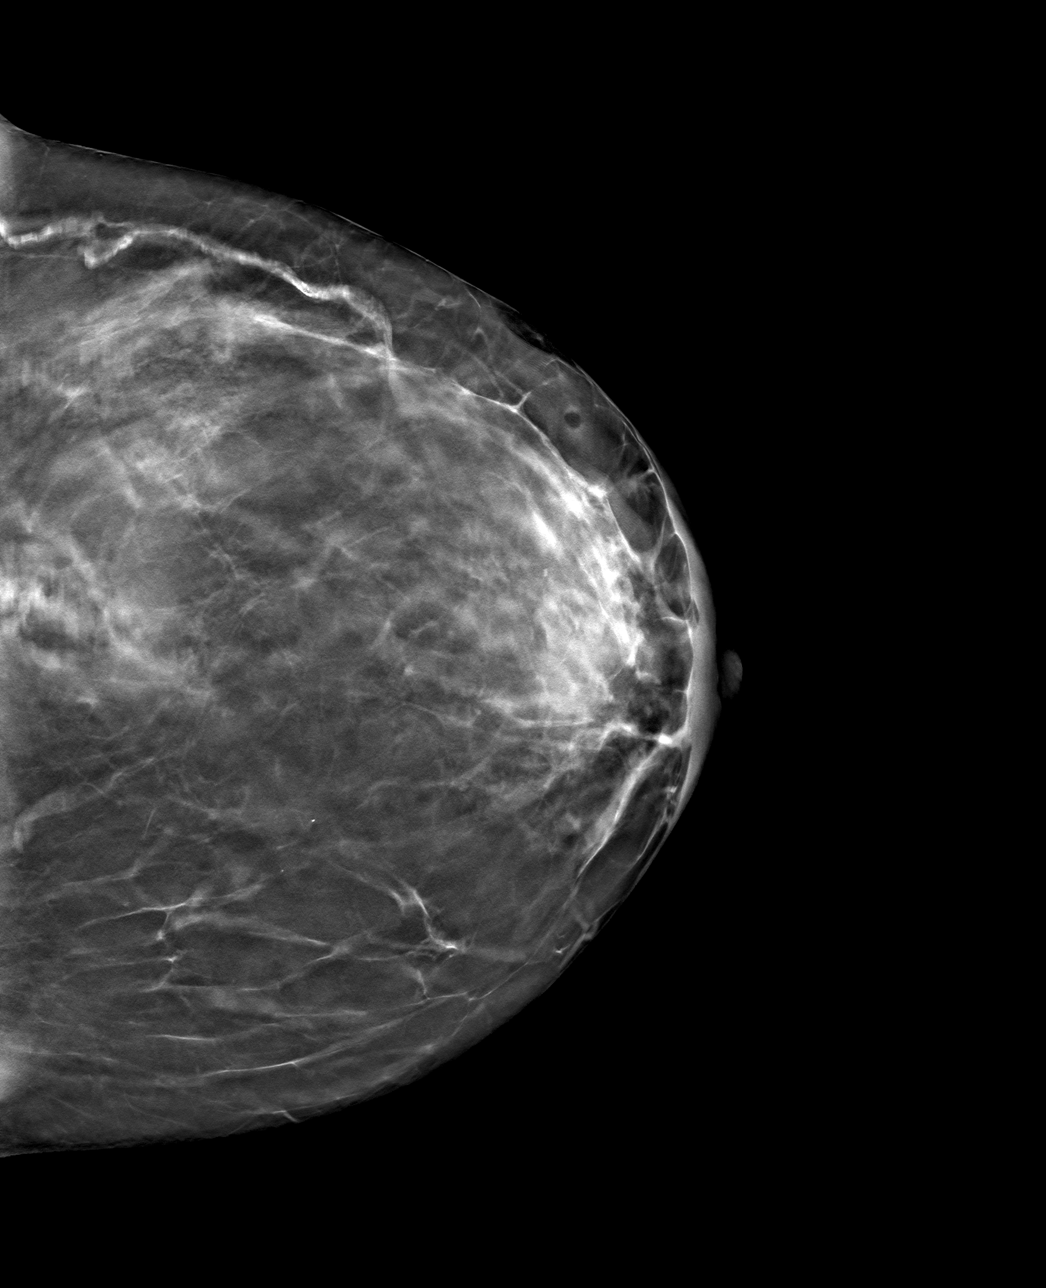

[4 of 12 positions shown; findings below may reference images not displayed]

FINDINGS: 3D Mammographic images were obtained following MR guided core
biopsies of the left breast. The barbell shaped clip is in
appropriate position in the posterior/inferior aspect of the left
breast and the cylinder-shaped clip is in appropriate position in
the anterior retroareolar of the left breast.
IMPRESSION: Appropriate positioning barbell shaped clip in the
posterior/inferior aspect of the left breast and the cylinder shaped
clip in the anterior retroareolar region of the left breast.

Final Assessment: Post Procedure Mammograms for Marker Placement

## 2021-10-19 MED ORDER — GADOBUTROL 1 MMOL/ML IV SOLN
7.0000 mL | Freq: Once | INTRAVENOUS | Status: AC | PRN
Start: 2021-10-19 — End: 2021-10-19
  Administered 2021-10-19: 7 mL via INTRAVENOUS

## 2021-11-05 ENCOUNTER — Other Ambulatory Visit: Payer: Medicare Other

## 2021-11-05 DIAGNOSIS — N1831 Chronic kidney disease, stage 3a: Secondary | ICD-10-CM | POA: Diagnosis not present

## 2021-11-05 DIAGNOSIS — E7800 Pure hypercholesterolemia, unspecified: Secondary | ICD-10-CM

## 2021-11-05 DIAGNOSIS — E890 Postprocedural hypothyroidism: Secondary | ICD-10-CM

## 2021-11-05 DIAGNOSIS — M8580 Other specified disorders of bone density and structure, unspecified site: Secondary | ICD-10-CM | POA: Diagnosis not present

## 2021-11-06 LAB — RENAL FUNCTION PANEL
Albumin: 4.3 g/dL (ref 3.7–4.7)
BUN/Creatinine Ratio: 10 — ABNORMAL LOW (ref 12–28)
BUN: 11 mg/dL (ref 8–27)
CO2: 24 mmol/L (ref 20–29)
Calcium: 9.6 mg/dL (ref 8.7–10.3)
Chloride: 100 mmol/L (ref 96–106)
Creatinine, Ser: 1.07 mg/dL — ABNORMAL HIGH (ref 0.57–1.00)
Glucose: 91 mg/dL (ref 70–99)
Phosphorus: 3.6 mg/dL (ref 3.0–4.3)
Potassium: 4.6 mmol/L (ref 3.5–5.2)
Sodium: 135 mmol/L (ref 134–144)
eGFR: 53 mL/min/{1.73_m2} — ABNORMAL LOW (ref >59)

## 2021-11-06 LAB — LIPID PANEL
Chol/HDL Ratio: 3.2 ratio (ref 0.0–4.4)
Cholesterol, Total: 177 mg/dL (ref 100–199)
HDL: 55 mg/dL (ref >39)
LDL Chol Calc (NIH): 102 mg/dL — ABNORMAL HIGH (ref 0–99)
Triglycerides: 113 mg/dL (ref 0–149)
VLDL Cholesterol Cal: 20 mg/dL (ref 5–40)

## 2021-11-06 LAB — CBC

## 2021-11-06 LAB — VITAMIN D 25 HYDROXY (VIT D DEFICIENCY, FRACTURES): Vit D, 25-Hydroxy: 51.9 ng/mL (ref 30.0–100.0)

## 2021-11-06 LAB — T4, FREE: Free T4: 1.4 ng/dL (ref 0.82–1.77)

## 2021-11-06 LAB — TSH: TSH: 3.22 u[IU]/mL (ref 0.450–4.500)

## 2021-11-11 ENCOUNTER — Other Ambulatory Visit: Payer: Self-pay | Admitting: Surgery

## 2021-11-11 DIAGNOSIS — N6489 Other specified disorders of breast: Secondary | ICD-10-CM | POA: Diagnosis not present

## 2021-11-16 ENCOUNTER — Encounter: Payer: Self-pay | Admitting: Family Medicine

## 2021-11-16 ENCOUNTER — Ambulatory Visit (INDEPENDENT_AMBULATORY_CARE_PROVIDER_SITE_OTHER): Payer: Medicare Other | Admitting: Family Medicine

## 2021-11-16 VITALS — BP 132/76 | HR 67 | Temp 98.3°F | Ht 67.0 in | Wt 178.0 lb

## 2021-11-16 DIAGNOSIS — E890 Postprocedural hypothyroidism: Secondary | ICD-10-CM

## 2021-11-16 DIAGNOSIS — M8580 Other specified disorders of bone density and structure, unspecified site: Secondary | ICD-10-CM

## 2021-11-16 DIAGNOSIS — I10 Essential (primary) hypertension: Secondary | ICD-10-CM | POA: Diagnosis not present

## 2021-11-16 DIAGNOSIS — Z9011 Acquired absence of right breast and nipple: Secondary | ICD-10-CM | POA: Diagnosis not present

## 2021-11-16 DIAGNOSIS — N1831 Chronic kidney disease, stage 3a: Secondary | ICD-10-CM | POA: Diagnosis not present

## 2021-11-16 DIAGNOSIS — Z0000 Encounter for general adult medical examination without abnormal findings: Secondary | ICD-10-CM

## 2021-11-16 LAB — HEMOGLOBIN, FINGERSTICK: Hemoglobin: 12.7 g/dL (ref 11.1–15.9)

## 2021-11-16 MED ORDER — ALENDRONATE SODIUM 70 MG PO TABS: 70.0000 mg | ORAL_TABLET | ORAL | 3 refills | Status: DC

## 2021-11-16 MED ORDER — LEVOTHYROXINE SODIUM 100 MCG PO TABS: ORAL_TABLET | ORAL | 3 refills | Status: DC

## 2021-11-16 MED ORDER — LOSARTAN POTASSIUM 100 MG PO TABS: 100.0000 mg | ORAL_TABLET | Freq: Every day | ORAL | 3 refills | Status: DC

## 2021-11-16 MED ORDER — ATENOLOL 25 MG PO TABS: 25.0000 mg | ORAL_TABLET | Freq: Every day | ORAL | 3 refills | Status: DC

## 2021-11-16 MED ORDER — SIMVASTATIN 20 MG PO TABS: 20.0000 mg | ORAL_TABLET | Freq: Every day | ORAL | 3 refills | Status: DC

## 2021-11-16 MED ORDER — AMLODIPINE BESYLATE 5 MG PO TABS: 5.0000 mg | ORAL_TABLET | Freq: Every day | ORAL | 3 refills | Status: DC

## 2021-11-16 NOTE — Patient Instructions (Signed)
Preventive Care 65 Years and Older, Female Preventive care refers to lifestyle choices and visits with your health care provider that can promote health and wellness. Preventive care visits are also called wellness exams. What can I expect for my preventive care visit? Counseling Your health care provider may ask you questions about your: Medical history, including: Past medical problems. Family medical history. Pregnancy and menstrual history. History of falls. Current health, including: Memory and ability to understand (cognition). Emotional well-being. Home life and relationship well-being. Sexual activity and sexual health. Lifestyle, including: Alcohol, nicotine or tobacco, and drug use. Access to firearms. Diet, exercise, and sleep habits. Work and work environment. Sunscreen use. Safety issues such as seatbelt and bike helmet use. Physical exam Your health care provider will check your: Height and weight. These may be used to calculate your BMI (body mass index). BMI is a measurement that tells if you are at a healthy weight. Waist circumference. This measures the distance around your waistline. This measurement also tells if you are at a healthy weight and may help predict your risk of certain diseases, such as type 2 diabetes and high blood pressure. Heart rate and blood pressure. Body temperature. Skin for abnormal spots. What immunizations do I need?  Vaccines are usually given at various ages, according to a schedule. Your health care provider will recommend vaccines for you based on your age, medical history, and lifestyle or other factors, such as travel or where you work. What tests do I need? Screening Your health care provider may recommend screening tests for certain conditions. This may include: Lipid and cholesterol levels. Hepatitis C test. Hepatitis B test. HIV (human immunodeficiency virus) test. STI (sexually transmitted infection) testing, if you are at  risk. Lung cancer screening. Colorectal cancer screening. Diabetes screening. This is done by checking your blood sugar (glucose) after you have not eaten for a while (fasting). Mammogram. Talk with your health care provider about how often you should have regular mammograms. BRCA-related cancer screening. This may be done if you have a family history of breast, ovarian, tubal, or peritoneal cancers. Bone density scan. This is done to screen for osteoporosis. Talk with your health care provider about your test results, treatment options, and if necessary, the need for more tests. Follow these instructions at home: Eating and drinking  Eat a diet that includes fresh fruits and vegetables, whole grains, lean protein, and low-fat dairy products. Limit your intake of foods with high amounts of sugar, saturated fats, and salt. Take vitamin and mineral supplements as recommended by your health care provider. Do not drink alcohol if your health care provider tells you not to drink. If you drink alcohol: Limit how much you have to 0-1 drink a day. Know how much alcohol is in your drink. In the [place] one drink equals one 12 oz bottle of beer (355 mL), one 5 oz glass of wine (148 mL), or one 1 oz glass of hard liquor (44 mL). Lifestyle Brush your teeth every morning and night with fluoride toothpaste. Floss one time each day. Exercise for at least 30 minutes 5 or more days each week. Do not use any products that contain nicotine or tobacco. These products include cigarettes, chewing tobacco, and vaping devices, such as e-cigarettes. If you need help quitting, ask your health care provider. Do not use drugs. If you are sexually active, practice safe sex. Use a condom or other form of protection in order to prevent STIs. Take aspirin only as told by   your health care provider. Make sure that you understand how much to take and what form to take. Work with your health care provider to find out whether it  is safe and beneficial for you to take aspirin daily. Ask your health care provider if you need to take a cholesterol-lowering medicine (statin). Find healthy ways to manage stress, such as: Meditation, yoga, or listening to music. Journaling. Talking to a trusted person. Spending time with friends and family. Minimize exposure to UV radiation to reduce your risk of skin cancer. Safety Always wear your seat belt while driving or riding in a vehicle. Do not drive: If you have been drinking alcohol. Do not ride with someone who has been drinking. When you are tired or distracted. While texting. If you have been using any mind-altering substances or drugs. Wear a helmet and other protective equipment during sports activities. If you have firearms in your house, make sure you follow all gun safety procedures. What's next? Visit your health care provider once a year for an annual wellness visit. Ask your health care provider how often you should have your eyes and teeth checked. Stay up to date on all vaccines. This information is not intended to replace advice given to you by your health care provider. Make sure you discuss any questions you have with your health care provider. Document Revised: 10/22/2020 Document Reviewed: 10/22/2020 [employer] Patient Education  2023 [employer]  

## 2021-11-16 NOTE — Progress Notes (Signed)
Patient returning call. Please call back

## 2021-11-16 NOTE — Progress Notes (Signed)
April Weber is a 76 y.o. female presents to office today for annual physical exam examination.    Concerns today include: 1.  Would like to get her hemoglobin tested as this was something that was not completed at last visit due to issues with the lab.  She will be having a lumpectomy soon.  This is to be scheduled on her left breast.  She is asking for leisure bras and prosthesis to be written so that she may bring it to her supplier.  She has history of mastectomy on the right.  Occupation: Retired, Substance use: None Diet: Tries to eat low meat due to renal disease, Exercise: Active Last eye exam: Due soon Last dental exam: Up-to-date Last colonoscopy: Up-to-date Last mammogram: Up-to-date Last pap smear: N/A Refills needed today: None Immunizations needed: Immunization History  Administered Date(s) Administered   Fluad Quad(high Dose 65+) 02/09/2019, 02/13/2020, 02/18/2021   Influenza Whole 02/16/2010   Influenza, High Dose Seasonal PF 02/12/2015, 03/05/2016, 03/01/2017, 02/17/2018   Influenza,inj,Quad PF,6+ Mos 02/13/2013, 02/13/2014   Moderna Sars-Covid-2 Vaccination 06/15/2019, 07/14/2019, 03/11/2020, 08/19/2020, 01/27/2021   Pneumococcal Conjugate-13 08/14/2014   Pneumococcal Polysaccharide-23 10/24/2009   Td 01/09/2019, 01/09/2019   Tdap 08/28/2008   Unspecified SARS-COV-2 Vaccination 08/19/2020   Zoster, Live 03/23/2007     Past Medical History:  Diagnosis Date   Breast cancer ([place]) 1980   RIGHT   Chronic kidney disease    has one kidney   DVT (deep venous thrombosis) (HCC)    on RIGHT leg-hx of   Hyperlipidemia    on meds   Hypertension    on meds   Osteopenia    Seasonal allergies    Thyroid disease    on meds   Social History   Socioeconomic History   Marital status: Single    Spouse name: Divorced    Number of children: 1   Years of education: Not on file   Highest education level: Not on file  Occupational History   Occupation: Retired     Comment: worked at [employer] in [employer]  Tobacco Use   Smoking status: Former    Packs/day: 0.25    Types: Cigarettes    Quit date: 05/11/1967    Years since quitting: 54.5   Smokeless tobacco: Never  Vaping Use   Vaping Use: Never used  Substance and Sexual Activity   Alcohol use: No   Drug use: No   Sexual activity: Not Currently    Birth control/protection: Post-menopausal, Surgical    Comment: hyst  Other Topics Concern   Not on file  Social History Narrative   Lives alone - very close with her neighbors   Social Determinants of Health   Financial Resource Strain: Low Risk  (08/26/2021)   Overall Financial Resource Strain (CARDIA)    Difficulty of Paying Living Expenses: Not hard at all  Food Insecurity: No Food Insecurity (08/26/2021)   Hunger Vital Sign    Worried About Running Out of Food in the Last Year: Never true    Ran Out of Food in the Last Year: Never true  Transportation Needs: No Transportation Needs (08/26/2021)   PRAPARE - Hydrologist (Medical): No    Lack of Transportation (Non-Medical): No  Physical Activity: Sufficiently Active (08/26/2021)   Exercise Vital Sign    Days of Exercise per Week: 7 days    Minutes of Exercise per Session: 30 min  Stress: No Stress Concern Present (  08/26/2021)   [employer]    Feeling of Stress : Not at all  Social Connections: Moderately Integrated (08/26/2021)   Social Connection and Isolation Panel [NHANES]    Frequency of Communication with Friends and Family: More than three times a week    Frequency of Social Gatherings with Friends and Family: More than three times a week    Attends Religious Services: More than 4 times per year    Active Member of [employer] or Organizations: Yes    Attends Music therapist: More than 4 times per year    Marital Status: Divorced  Intimate Partner  Violence: Not At Risk (08/26/2021)   Humiliation, Afraid, Rape, and Kick questionnaire    Fear of Current or Ex-Partner: No    Emotionally Abused: No    Physically Abused: No    Sexually Abused: No   Past Surgical History:  Procedure Laterality Date   ABDOMINAL HYSTERECTOMY     APPENDECTOMY     BREAST BIOPSY     COLONOSCOPY  2011   Dr.Kaplan-normal   EYE SURGERY     76 years old   MASTECTOMY Right [place]     THYROID SURGERY Bilateral 1967   VARICOSE VEIN SURGERY     ablation   WISDOM TOOTH EXTRACTION     Family History  Problem Relation Age of Onset   Heart attack Mother    Alcohol abuse Mother    Brain cancer Mother    Breast cancer Maternal Aunt    Colon polyps Neg Hx    Colon cancer Neg Hx    Esophageal cancer Neg Hx    Stomach cancer Neg Hx    Rectal cancer Neg Hx     Current Outpatient Medications:    alendronate (FOSAMAX) 70 MG tablet, Take 1 tablet (70 mg total) by mouth every 7 (seven) days. Take with a full glass of water on an empty stomach., Disp: 12 tablet, Rfl: 3   amLODipine (NORVASC) 5 MG tablet, Take 1 tablet (5 mg total) by mouth daily., Disp: 90 tablet, Rfl: 3   aspirin 81 MG EC tablet, Take 81 mg by mouth daily., Disp: , Rfl:    atenolol (TENORMIN) 25 MG tablet, Take 1 tablet (25 mg total) by mouth daily., Disp: 90 tablet, Rfl: 3   Calcium 600-200 MG-UNIT per tablet, Take 1 tablet by mouth 2 (two) times daily., Disp: , Rfl:    levothyroxine (SYNTHROID) 100 MCG tablet, Take 1/2 tablet on Sundays and 1 tablet daily all other days, Disp: 90 tablet, Rfl: 3   loratadine (CLARITIN) 10 MG tablet, Take 10 mg by mouth daily., Disp: , Rfl:    losartan (COZAAR) 100 MG tablet, Take 1 tablet (100 mg total) by mouth daily., Disp: 90 tablet, Rfl: 3   Multiple Vitamin (MULTIVITAMIN) capsule, Take 1 capsule by mouth daily., Disp: , Rfl:    simvastatin (ZOCOR) 20 MG tablet, Take 1 tablet (20 mg total) by mouth at bedtime. Cancel rx for '10mg'$ ., Disp: 90  tablet, Rfl: 3   triamcinolone (NASACORT) 55 MCG/ACT AERO nasal inhaler, Place 2 sprays into the nose daily., Disp: , Rfl:   Allergies  Allergen Reactions   Codeine     REACTION: makes sick   Doxycycline     REACTION: whelps hives   Iodine     REACTION: breakout   Penicillins     REACTION: whelps hives     ROS:  Review of Systems A comprehensive review of systems was negative except for: Eyes: positive for contacts/glasses Integument/breast: positive for skin lesion(s)    Physical exam BP 132/76   Pulse 67   Temp 98.3 F (36.8 C)   Ht '5\' 7"'$  (1.702 m)   Wt 178 lb (80.7 kg)   SpO2 98%   BMI 27.88 kg/m  General appearance: alert, cooperative, appears stated age, and no distress Head: Normocephalic, without obvious abnormality, atraumatic Eyes: negative findings: lids and lashes normal, conjunctivae and sclerae normal, corneas clear, and pupils equal, round, reactive to light and accomodation Ears: normal TM's and external ear canals both ears Nose: Nares normal. Septum midline. Mucosa normal. No drainage or sinus tenderness. Throat: lips, mucosa, and tongue normal; teeth and gums normal Neck: no adenopathy, no carotid bruit, supple, symmetrical, trachea midline, and thyroid not enlarged, symmetric, no tenderness/mass/nodules Back: symmetric, no curvature. ROM normal. No CVA tenderness. Lungs: clear to auscultation bilaterally Heart: regular rate and rhythm, S1, S2 normal, no murmur, click, rub or gallop Abdomen: soft, non-tender; bowel sounds normal; no masses,  no organomegaly Extremities: extremities normal, atraumatic, no cyanosis or edema Pulses: 2+ and symmetric Skin:  Lesion along the right forearm that appears to be consistent with a seborrheic keratosis Lymph nodes: Cervical, supraclavicular, and axillary nodes normal. Neurologic: Grossly normal Psych: Mood stable, speech normal, affect appropriate.     11/16/2021    8:13 AM 09/15/2021   11:08 AM 08/26/2021    2:40  PM  Depression screen PHQ 2/9  Decreased Interest 0 0 0  Down, Depressed, Hopeless 0 0 0  PHQ - 2 Score 0 0 0      11/16/2021    8:13 AM 09/15/2021   11:08 AM 11/17/2020    8:33 AM 10/22/2019   10:44 AM  GAD 7 : Generalized Anxiety Score  Nervous, Anxious, on Edge 0 0 0 0  Control/stop worrying 0 0 0 0  Worry too much - different things 0 0 0 0  Trouble relaxing 0 0 0 0  Restless 0 0 0 0  Easily annoyed or irritable 0 0 0 0  Afraid - awful might happen 0 0 0 0  Total GAD 7 Score 0 0 0 0  Anxiety Difficulty Not difficult at all Not difficult at all Not difficult at all Not difficult at all     Assessment/ Plan: [name] here for annual physical exam.   Essential hypertension - Plan: amLODipine (NORVASC) 5 MG tablet, atenolol (TENORMIN) 25 MG tablet, losartan (COZAAR) 100 MG tablet  Annual physical exam  Osteopenia with high risk of fracture - Plan: alendronate (FOSAMAX) 70 MG tablet  Postoperative hypothyroidism - Plan: levothyroxine (SYNTHROID) 100 MCG tablet, TSH, T4, free  Stage 3a chronic kidney disease (HCC) - Plan: Hemoglobin, fingerstick, CBC, Renal Function Panel  History of right mastectomy  Blood pressure well controlled.  No changes.  Rx's have been renewed for future use  Fosamax renewed.  Vitamin D level appropriate.  Future orders for thyroid and renal function placed in anticipation of her next visit.  In the meantime point-of-care hemoglobin was obtained which demonstrated a normal hemoglobin level at 12.7 today.  No changes  Written prescription for prosthesis and leisure bras given to the patient.  May follow-up in 6 months, sooner if concerns arise  Dennys Guin M. Lajuana Ripple, DO

## 2021-11-18 ENCOUNTER — Other Ambulatory Visit: Payer: Self-pay | Admitting: Surgery

## 2021-11-18 DIAGNOSIS — N6489 Other specified disorders of breast: Secondary | ICD-10-CM

## 2021-11-23 ENCOUNTER — Other Ambulatory Visit: Payer: Self-pay

## 2021-11-23 ENCOUNTER — Encounter (HOSPITAL_BASED_OUTPATIENT_CLINIC_OR_DEPARTMENT_OTHER): Payer: Self-pay | Admitting: Surgery

## 2021-11-29 NOTE — Anesthesia Preprocedure Evaluation (Addendum)
Anesthesia Evaluation  Patient identified by MRN, date of birth, ID band Patient awake    Reviewed: Allergy & Precautions, NPO status , Patient's Chart, lab work & pertinent test results  History of Anesthesia Complications (+) PONV and history of anesthetic complications  Airway Mallampati: II  TM Distance: >3 FB Neck ROM: Full    Dental  (+) Upper Dentures, Partial Lower   Pulmonary former smoker,    Pulmonary exam normal        Cardiovascular hypertension, Pt. on medications Normal cardiovascular exam     Neuro/Psych negative neurological ROS  negative psych ROS   GI/Hepatic negative GI ROS, Neg liver ROS,   Endo/Other  Hypothyroidism   Renal/GU Renal InsufficiencyRenal disease (solitary kidney)  negative genitourinary   Musculoskeletal negative musculoskeletal ROS (+)   Abdominal   Peds  Hematology negative hematology ROS (+)   Anesthesia Other Findings LEFT BREAST COMPLEX SCLEROSING LESION  Reproductive/Obstetrics negative OB ROS                            Anesthesia Physical Anesthesia Plan  ASA: 2  Anesthesia Plan: General   Post-op Pain Management: Tylenol PO (pre-op)*   Induction: Intravenous  PONV Risk Score and Plan: 4 or greater and Treatment may vary due to age or medical condition, Ondansetron and Dexamethasone  Airway Management Planned: LMA  Additional Equipment: None  Intra-op Plan:   Post-operative Plan: Extubation in OR  Informed Consent: I have reviewed the patients History and Physical, chart, labs and discussed the procedure including the risks, benefits and alternatives for the proposed anesthesia with the patient or authorized representative who has indicated his/her understanding and acceptance.     Dental advisory given  Plan Discussed with: CRNA  Anesthesia Plan Comments:        Anesthesia Quick Evaluation

## 2021-11-30 ENCOUNTER — Encounter (HOSPITAL_BASED_OUTPATIENT_CLINIC_OR_DEPARTMENT_OTHER)
Admission: RE | Admit: 2021-11-30 | Discharge: 2021-11-30 | Disposition: A | Discharge status: Home / Self Care | Payer: Medicare Other | Source: Ambulatory Visit | Attending: Surgery | Admitting: Surgery

## 2021-11-30 ENCOUNTER — Ambulatory Visit
Admission: RE | Admit: 2021-11-30 | Discharge: 2021-11-30 | Disposition: A | Discharge status: Home / Self Care | Payer: Medicare Other | Source: Ambulatory Visit | Attending: Surgery | Admitting: Surgery

## 2021-11-30 DIAGNOSIS — Z01810 Encounter for preprocedural cardiovascular examination: Secondary | ICD-10-CM | POA: Diagnosis not present

## 2021-11-30 DIAGNOSIS — N6489 Other specified disorders of breast: Secondary | ICD-10-CM

## 2021-11-30 DIAGNOSIS — R928 Other abnormal and inconclusive findings on diagnostic imaging of breast: Secondary | ICD-10-CM | POA: Diagnosis not present

## 2021-11-30 DIAGNOSIS — I10 Essential (primary) hypertension: Secondary | ICD-10-CM | POA: Insufficient documentation

## 2021-11-30 MED ORDER — ENSURE PRE-SURGERY PO LIQD: 296.0000 mL | Freq: Once | ORAL | Status: DC

## 2021-11-30 NOTE — H&P (Signed)
REFERRING PHYSICIAN: [name], DO  PROVIDER: Beverlee Nims, MD  MRN: [id number] DOB: 1946/04/08 DATE OF ENCOUNTER: 11/11/2021 Subjective   Chief Complaint: new patient   History of Present Illness: April Weber is a 76 y.o. female who is seen today as an office consultation for evaluation of new patient .   This is a pleasant 76 year old female who was referred here for evaluation of abnormalities of the left breast. She reports a several month history of pain in her left breast. She has a previous history of a right mastectomy in 1984 for breast cancer. Her mammogram of the right breast showed increasing densities in the breast. Because of her history she had up having MRI. The MRI showed 11 cm of non-mass-like enhancement from the nipple to posterior. She then had the most anterior and posterior extents of this area biopsied. The posterior extent showed complex sclerosing lesion and fibrocystic changes. There were only fibrocystic changes with the most anterior aspect. Removal of the complex grossing area was recommended for complete histologic evaluation. She denies nipple discharge. She is otherwise healthy and without complaints.  Review of Systems: A complete review of systems was obtained from the patient. I have reviewed this information and discussed as appropriate with the patient. See HPI as well for other ROS.  ROS   Medical History: Past Medical History:  Diagnosis Date  Arthritis  Chronic kidney disease  Hypertension  Thyroid disease   There is no problem list on file for this patient.  Past Surgical History:  Procedure Laterality Date  AJCC BREAST CANCER STAGE I: T1MIC, T1A OR T1B (TUMOR SIZE < 1 CM) DOCUMENTED (ONC)  HYSTERECTOMY  rotator cuff surgery  thyroid cancer  venus ablation    Allergies  Allergen Reactions  Codeine Nausea  REACTION: makes sick  Doxycycline Unknown  REACTION: whelps hives  Iodinated Contrast Media Unknown   Penicillins Unknown  REACTION: whelps hives   Current Outpatient Medications on File Prior to Visit  Medication Sig Dispense Refill  alendronate (FOSAMAX) 70 MG tablet Take by mouth  amLODIPine (NORVASC) 5 MG tablet Take 5 mg by mouth once daily  atenoloL (TENORMIN) 25 MG tablet Take 1 tablet by mouth once daily  levothyroxine (SYNTHROID) 100 MCG tablet Take 1 tablet by mouth once daily  simvastatin (ZOCOR) 20 MG tablet Take by mouth  aspirin 81 MG EC tablet Take 81 mg by mouth once daily  calcium carbonate-vitamin D3 (CALTRATE 600+D) 600 mg-5 mcg (200 unit) tablet Take 1 tablet by mouth 2 (two) times daily with meals  loratadine (CLARITIN) 10 mg tablet Take 10 mg by mouth once daily  losartan (COZAAR) 50 MG tablet Take 1 tablet by mouth once daily  multivitamin capsule Take 1 capsule by mouth once daily  triamcinolone (NASACORT AQ) 55 mcg nasal spray Place into one nostril   No current facility-administered medications on file prior to visit.   Family History  Problem Relation Age of Onset  Stroke Mother  High blood pressure (Hypertension) Mother  Hyperlipidemia (Elevated cholesterol) Mother  Coronary Artery Disease (Blocked arteries around heart) Mother    Social History   Tobacco Use  Smoking Status Former  Types: Cigarettes  Quit date: 1970  Years since quitting: 53.5  Smokeless Tobacco Not on file    Social History   Socioeconomic History  Marital status: Single  Tobacco Use  Smoking status: Former  Types: Cigarettes  Quit date: 1970  Years since quitting: 53.5  Substance and Sexual Activity  Alcohol use: Not Currently  Drug use: Yes  Comment: controlled prescriptions   Objective:   Vitals:  11/11/21 1502  BP: 114/62  Pulse: 88  Temp: 36.9 C (98.5 F)  SpO2: 98%  Weight: 80.7 kg (178 lb)  Height: 170.2 cm ('5\' 7"'$ )   Body mass index is 27.88 kg/m.  Physical Exam   She appears well on exam  she has a well-healed right mastectomy  Her left  breast appears normal. There are no palpable breast masses and no axillary adenopathy. The nipple areolar complex is normal  Labs, Imaging and Diagnostic Testing: I have reviewed her mammograms, ultrasound, and MRI as well as her pathology results  Assessment and Plan:   Diagnoses and all orders for this visit:  Complex sclerosing lesion of left breast    At this point I discussed at length her pathology results. A radioactive seed guided left breast lumpectomy to remove the area of concern posteriorly containing the complex grossing lesion is recommended. I gave her a copy of the pathology results and we discussed the reasonings for surgery. I explained the surgical procedure in detail. I discussed the risk which includes but is not limited to bleeding, infection, the need for further surgery if malignancy is found. We also discussed postoperative recovery. At this point, she understands and surgery will be scheduled

## 2021-11-30 NOTE — Progress Notes (Signed)
Sent text reminding pt to come in for EKG today.

## 2021-11-30 NOTE — Progress Notes (Signed)
       Patient Instructions  The night before surgery:  No food after midnight. ONLY clear liquids after midnight  The day of surgery (if you do NOT have diabetes):  Drink ONE (1) Pre-Surgery Clear Ensure as directed.   This drink was given to you during your hospital  pre-op appointment visit. The pre-op nurse will instruct you on the time to drink the  Pre-Surgery Ensure depending on your surgery time. Finish the drink at the designated time by the pre-op nurse.  Nothing else to drink after completing the  Pre-Surgery Clear Ensure.  The day of surgery (if you have diabetes): Drink ONE (1) Gatorade 2 (G2) as directed. This drink was given to you during your hospital  pre-op appointment visit.  The pre-op nurse will instruct you on the time to drink the   Gatorade 2 (G2) depending on your surgery time. Color of the Gatorade may vary. Red is not allowed. Nothing else to drink after completing the  Gatorade 2 (G2).         If you have questions, please contact your surgeon's office.    Patient given presurgical soap. Education provided. Patient verbalized understanding.

## 2021-12-01 ENCOUNTER — Ambulatory Visit (HOSPITAL_BASED_OUTPATIENT_CLINIC_OR_DEPARTMENT_OTHER): Payer: Medicare Other | Admitting: Anesthesiology

## 2021-12-01 ENCOUNTER — Encounter (HOSPITAL_BASED_OUTPATIENT_CLINIC_OR_DEPARTMENT_OTHER): Payer: Self-pay | Admitting: Surgery

## 2021-12-01 ENCOUNTER — Ambulatory Visit
Admission: RE | Admit: 2021-12-01 | Discharge: 2021-12-01 | Disposition: A | Discharge status: Home / Self Care | Payer: Medicare Other | Source: Ambulatory Visit | Attending: Surgery | Admitting: Surgery

## 2021-12-01 ENCOUNTER — Other Ambulatory Visit: Payer: Self-pay

## 2021-12-01 ENCOUNTER — Encounter (HOSPITAL_BASED_OUTPATIENT_CLINIC_OR_DEPARTMENT_OTHER)
Admission: RE | Disposition: A | Discharge status: Home / Self Care | Payer: Self-pay | Source: Home / Self Care | Attending: Surgery

## 2021-12-01 ENCOUNTER — Ambulatory Visit (HOSPITAL_BASED_OUTPATIENT_CLINIC_OR_DEPARTMENT_OTHER)
Admission: RE | Admit: 2021-12-01 | Discharge: 2021-12-01 | Disposition: A | Discharge status: Home / Self Care | Payer: Medicare Other | Attending: Surgery | Admitting: Surgery

## 2021-12-01 DIAGNOSIS — N6489 Other specified disorders of breast: Secondary | ICD-10-CM

## 2021-12-01 DIAGNOSIS — Z9013 Acquired absence of bilateral breasts and nipples: Secondary | ICD-10-CM | POA: Insufficient documentation

## 2021-12-01 DIAGNOSIS — N6012 Diffuse cystic mastopathy of left breast: Secondary | ICD-10-CM | POA: Diagnosis not present

## 2021-12-01 DIAGNOSIS — E039 Hypothyroidism, unspecified: Secondary | ICD-10-CM | POA: Insufficient documentation

## 2021-12-01 DIAGNOSIS — N6022 Fibroadenosis of left breast: Secondary | ICD-10-CM | POA: Insufficient documentation

## 2021-12-01 DIAGNOSIS — R928 Other abnormal and inconclusive findings on diagnostic imaging of breast: Secondary | ICD-10-CM | POA: Diagnosis not present

## 2021-12-01 DIAGNOSIS — Z01818 Encounter for other preprocedural examination: Secondary | ICD-10-CM

## 2021-12-01 DIAGNOSIS — Z79899 Other long term (current) drug therapy: Secondary | ICD-10-CM | POA: Insufficient documentation

## 2021-12-01 DIAGNOSIS — I10 Essential (primary) hypertension: Secondary | ICD-10-CM | POA: Insufficient documentation

## 2021-12-01 DIAGNOSIS — N62 Hypertrophy of breast: Secondary | ICD-10-CM | POA: Diagnosis not present

## 2021-12-01 DIAGNOSIS — Z87891 Personal history of nicotine dependence: Secondary | ICD-10-CM | POA: Insufficient documentation

## 2021-12-01 DIAGNOSIS — Z853 Personal history of malignant neoplasm of breast: Secondary | ICD-10-CM | POA: Diagnosis not present

## 2021-12-01 HISTORY — DX: Other specified postprocedural states: R11.2

## 2021-12-01 HISTORY — PX: BREAST LUMPECTOMY WITH RADIOACTIVE SEED LOCALIZATION: SHX6424

## 2021-12-01 HISTORY — DX: Other specified postprocedural states: Z98.890

## 2021-12-01 SURGERY — BREAST LUMPECTOMY WITH RADIOACTIVE SEED LOCALIZATION
Anesthesia: General | Site: Breast | Laterality: Left

## 2021-12-01 MED ORDER — DEXAMETHASONE SODIUM PHOSPHATE 4 MG/ML IJ SOLN
INTRAMUSCULAR | Status: DC | PRN
  Administered 2021-12-01: 5 mg via INTRAVENOUS

## 2021-12-01 MED ORDER — BUPIVACAINE-EPINEPHRINE 0.5% -1:200000 IJ SOLN
INTRAMUSCULAR | Status: DC | PRN
  Administered 2021-12-01: 20 mL

## 2021-12-01 MED ORDER — CHLORHEXIDINE GLUCONATE CLOTH 2 % EX PADS: 6.0000 | MEDICATED_PAD | Freq: Once | CUTANEOUS | Status: DC

## 2021-12-01 MED ORDER — LIDOCAINE 2% (20 MG/ML) 5 ML SYRINGE
INTRAMUSCULAR | Status: AC
  Filled 2021-12-01: qty 5

## 2021-12-01 MED ORDER — FENTANYL CITRATE (PF) 100 MCG/2ML IJ SOLN
INTRAMUSCULAR | Status: AC
  Filled 2021-12-01: qty 2

## 2021-12-01 MED ORDER — MIDAZOLAM HCL 2 MG/2ML IJ SOLN
INTRAMUSCULAR | Status: AC
  Filled 2021-12-01: qty 2

## 2021-12-01 MED ORDER — LIDOCAINE HCL (CARDIAC) PF 100 MG/5ML IV SOSY
PREFILLED_SYRINGE | INTRAVENOUS | Status: DC | PRN
  Administered 2021-12-01: 100 mg via INTRAVENOUS

## 2021-12-01 MED ORDER — EPHEDRINE SULFATE (PRESSORS) 50 MG/ML IJ SOLN
INTRAMUSCULAR | Status: DC | PRN
  Administered 2021-12-01: 5 mg via INTRAVENOUS
  Administered 2021-12-01 (×2): 10 mg via INTRAVENOUS

## 2021-12-01 MED ORDER — TRAMADOL HCL 50 MG PO TABS: 50.0000 mg | ORAL_TABLET | Freq: Four times a day (QID) | ORAL | 1 refills | Status: DC | PRN

## 2021-12-01 MED ORDER — LACTATED RINGERS IV SOLN: INTRAVENOUS | Status: DC

## 2021-12-01 MED ORDER — PROPOFOL 10 MG/ML IV BOLUS
INTRAVENOUS | Status: DC | PRN
  Administered 2021-12-01: 50 mg via INTRAVENOUS
  Administered 2021-12-01: 150 mg via INTRAVENOUS

## 2021-12-01 MED ORDER — CIPROFLOXACIN IN D5W 400 MG/200ML IV SOLN
400.0000 mg | INTRAVENOUS | Status: AC
  Administered 2021-12-01 (×2): 400 mg via INTRAVENOUS

## 2021-12-01 MED ORDER — CIPROFLOXACIN IN D5W 400 MG/200ML IV SOLN
INTRAVENOUS | Status: AC
  Filled 2021-12-01: qty 200

## 2021-12-01 MED ORDER — EPHEDRINE 5 MG/ML INJ
INTRAVENOUS | Status: AC
  Filled 2021-12-01: qty 5

## 2021-12-01 MED ORDER — ONDANSETRON HCL 4 MG/2ML IJ SOLN
INTRAMUSCULAR | Status: AC
  Filled 2021-12-01: qty 2

## 2021-12-01 MED ORDER — OXYCODONE HCL 5 MG PO TABS: 5.0000 mg | ORAL_TABLET | Freq: Once | ORAL | Status: DC | PRN

## 2021-12-01 MED ORDER — ACETAMINOPHEN 500 MG PO TABS
ORAL_TABLET | ORAL | Status: AC
  Filled 2021-12-01: qty 2

## 2021-12-01 MED ORDER — KETOROLAC TROMETHAMINE 30 MG/ML IJ SOLN
INTRAMUSCULAR | Status: AC
  Filled 2021-12-01: qty 1

## 2021-12-01 MED ORDER — OXYCODONE HCL 5 MG/5ML PO SOLN: 5.0000 mg | Freq: Once | ORAL | Status: DC | PRN

## 2021-12-01 MED ORDER — ACETAMINOPHEN 500 MG PO TABS: 1000.0000 mg | ORAL_TABLET | ORAL | Status: AC

## 2021-12-01 MED ORDER — FENTANYL CITRATE (PF) 100 MCG/2ML IJ SOLN
INTRAMUSCULAR | Status: DC | PRN
  Administered 2021-12-01 (×2): 25 ug via INTRAVENOUS
  Administered 2021-12-01: 50 ug via INTRAVENOUS

## 2021-12-01 MED ORDER — PHENYLEPHRINE HCL (PRESSORS) 10 MG/ML IV SOLN
INTRAVENOUS | Status: DC | PRN
  Administered 2021-12-01 (×2): 80 ug via INTRAVENOUS

## 2021-12-01 MED ORDER — FENTANYL CITRATE (PF) 100 MCG/2ML IJ SOLN: 25.0000 ug | INTRAMUSCULAR | Status: DC | PRN

## 2021-12-01 MED ORDER — ACETAMINOPHEN 500 MG PO TABS
1000.0000 mg | ORAL_TABLET | Freq: Once | ORAL | Status: AC
  Administered 2021-12-01: 1000 mg via ORAL

## 2021-12-01 MED ORDER — DEXAMETHASONE SODIUM PHOSPHATE 10 MG/ML IJ SOLN
INTRAMUSCULAR | Status: AC
Start: 2021-12-01 — End: ?
  Filled 2021-12-01: qty 1

## 2021-12-01 MED ORDER — ONDANSETRON HCL 4 MG/2ML IJ SOLN
INTRAMUSCULAR | Status: DC | PRN
  Administered 2021-12-01: 4 mg via INTRAVENOUS

## 2021-12-01 MED ORDER — AMISULPRIDE (ANTIEMETIC) 5 MG/2ML IV SOLN: 10.0000 mg | Freq: Once | INTRAVENOUS | Status: DC | PRN

## 2021-12-01 SURGICAL SUPPLY — 49 items
ADH SKN CLS APL DERMABOND .7 (GAUZE/BANDAGES/DRESSINGS) ×1
APL PRP STRL LF DISP 70% ISPRP (MISCELLANEOUS) ×1
APPLIER CLIP 9.375 MED OPEN (MISCELLANEOUS)
APR CLP MED 9.3 20 MLT OPN (MISCELLANEOUS)
BINDER BREAST 3XL (GAUZE/BANDAGES/DRESSINGS) IMPLANT
BINDER BREAST XLRG (GAUZE/BANDAGES/DRESSINGS) IMPLANT
BINDER BREAST XXLRG (GAUZE/BANDAGES/DRESSINGS) IMPLANT
BLADE SURG 15 STRL LF DISP TIS (BLADE) ×2 IMPLANT
BLADE SURG 15 STRL SS (BLADE) ×2
CANISTER SUC SOCK COL 7IN (MISCELLANEOUS) IMPLANT
CANISTER SUCT 1200ML W/VALVE (MISCELLANEOUS) IMPLANT
CHLORAPREP W/TINT 26 (MISCELLANEOUS) ×3 IMPLANT
CLIP APPLIE 9.375 MED OPEN (MISCELLANEOUS) IMPLANT
COVER BACK TABLE 60X90IN (DRAPES) ×3 IMPLANT
COVER MAYO STAND STRL (DRAPES) ×3 IMPLANT
COVER PROBE W GEL 5X96 (DRAPES) ×3 IMPLANT
DERMABOND ADVANCED (GAUZE/BANDAGES/DRESSINGS) ×1
DERMABOND ADVANCED .7 DNX12 (GAUZE/BANDAGES/DRESSINGS) ×2 IMPLANT
DRAPE LAPAROSCOPIC ABDOMINAL (DRAPES) ×3 IMPLANT
DRAPE UTILITY XL STRL (DRAPES) ×3 IMPLANT
ELECT REM PT RETURN 9FT ADLT (ELECTROSURGICAL) ×2
ELECTRODE REM PT RTRN 9FT ADLT (ELECTROSURGICAL) ×2 IMPLANT
GAUZE SPONGE 4X4 12PLY STRL LF (GAUZE/BANDAGES/DRESSINGS) IMPLANT
GLOVE BIO SURGEON STRL SZ7 (GLOVE) ×1 IMPLANT
GLOVE BIOGEL PI IND STRL 7.0 (GLOVE) IMPLANT
GLOVE BIOGEL PI INDICATOR 7.0 (GLOVE) ×1
GLOVE SURG SIGNA 7.5 PF LTX (GLOVE) ×3 IMPLANT
GOWN STRL REUS W/ TWL LRG LVL3 (GOWN DISPOSABLE) ×2 IMPLANT
GOWN STRL REUS W/ TWL XL LVL3 (GOWN DISPOSABLE) ×2 IMPLANT
GOWN STRL REUS W/TWL LRG LVL3 (GOWN DISPOSABLE) ×2
GOWN STRL REUS W/TWL XL LVL3 (GOWN DISPOSABLE) ×2
KIT MARKER MARGIN INK (KITS) ×3 IMPLANT
NDL HYPO 25X1 1.5 SAFETY (NEEDLE) ×2 IMPLANT
NEEDLE HYPO 25X1 1.5 SAFETY (NEEDLE) ×2 IMPLANT
NS IRRIG 1000ML POUR BTL (IV SOLUTION) IMPLANT
PACK BASIN DAY SURGERY FS (CUSTOM PROCEDURE TRAY) ×3 IMPLANT
PENCIL SMOKE EVACUATOR (MISCELLANEOUS) ×3 IMPLANT
SLEEVE SCD COMPRESS KNEE MED (STOCKING) ×3 IMPLANT
SPIKE FLUID TRANSFER (MISCELLANEOUS) IMPLANT
SPONGE T-LAP 4X18 ~~LOC~~+RFID (SPONGE) ×3 IMPLANT
SUT MNCRL AB 4-0 PS2 18 (SUTURE) ×3 IMPLANT
SUT SILK 2 0 SH (SUTURE) IMPLANT
SUT VIC AB 3-0 SH 27 (SUTURE) ×2
SUT VIC AB 3-0 SH 27X BRD (SUTURE) ×2 IMPLANT
SYR CONTROL 10ML LL (SYRINGE) ×3 IMPLANT
TOWEL GREEN STERILE FF (TOWEL DISPOSABLE) ×3 IMPLANT
TRAY FAXITRON CT DISP (TRAY / TRAY PROCEDURE) ×3 IMPLANT
TUBE CONNECTING 20X1/4 (TUBING) IMPLANT
YANKAUER SUCT BULB TIP NO VENT (SUCTIONS) IMPLANT

## 2021-12-01 NOTE — Discharge Instructions (Addendum)
[place] Office Phone Number [phone]  BREAST BIOPSY/ PARTIAL MASTECTOMY: POST OP INSTRUCTIONS  Always review your discharge instruction sheet given to you by the facility where your surgery was performed.  IF YOU HAVE DISABILITY OR FAMILY LEAVE FORMS, YOU MUST BRING THEM TO THE OFFICE FOR PROCESSING.  DO NOT GIVE THEM TO YOUR DOCTOR.  A prescription for pain medication may be given to you upon discharge.  Take your pain medication as prescribed, if needed.  If narcotic pain medicine is not needed, then you may take acetaminophen (Tylenol) or ibuprofen (Advil) as needed. Take your usually prescribed medications unless otherwise directed If you need a refill on your pain medication, please contact your pharmacy.  They will contact our office to request authorization.  Prescriptions will not be filled after 5pm or on week-ends. You should eat very light the first 24 hours after surgery, such as soup, crackers, pudding, etc.  Resume your normal diet the day after surgery. Most patients will experience some swelling and bruising in the breast.  Ice packs and a good support bra will help.  Swelling and bruising can take several days to resolve.  It is common to experience some constipation if taking pain medication after surgery.  Increasing fluid intake and taking a stool softener will usually help or prevent this problem from occurring.  A mild laxative (Milk of Magnesia or Miralax) should be taken according to package directions if there are no bowel movements after 48 hours. Unless discharge instructions indicate otherwise, you may remove your bandages 24-48 hours after surgery, and you may shower at that time.  You may have steri-strips (small skin tapes) in place directly over the incision.  These strips should be left on the skin for 7-10 days.  If your surgeon used skin glue on the incision, you may shower in 24 hours.  The glue will flake off over the next 2-3 weeks.  Any  sutures or staples will be removed at the office during your follow-up visit. ACTIVITIES:  You may resume regular daily activities (gradually increasing) beginning the next day.  Wearing a good support bra or sports bra minimizes pain and swelling.  You may have sexual intercourse when it is comfortable. You may drive when you no longer are taking prescription pain medication, you can comfortably wear a seatbelt, and you can safely maneuver your car and apply brakes. RETURN TO WORK:  ______________________________________________________________________________________ April Weber should see your doctor in the office for a follow-up appointment approximately two weeks after your surgery.  Your doctor's nurse will typically make your follow-up appointment when she calls you with your pathology report.  Expect your pathology report 2-3 business days after your surgery.  You may call to check if you do not hear from [place] after three days. OTHER INSTRUCTIONS: OK TO SHOWER STARTING TOMORROW TYLENOL AND IBUPROFEN ALSO FOR PAIN NO VIGOROUS ACTIVITY FOR ONE WEEK _______________________________________________________________________________________________ _____________________________________________________________________________________________________________________________________ _____________________________________________________________________________________________________________________________________ _____________________________________________________________________________________________________________________________________  WHEN TO CALL YOUR DOCTOR: Fever over 101.0 Nausea and/or vomiting. Extreme swelling or bruising. Continued bleeding from incision. Increased pain, redness, or drainage from the incision.  The clinic staff is available to answer your questions during regular business hours.  Please don't hesitate to call and ask to speak to one of the nurses for clinical concerns.  If  you have a medical emergency, go to the nearest emergency room or call 911.  A surgeon from [facility] is always on call at the hospital.  For further questions, please visit [email or web address]  2:30pm you may have more tylenol   Post Anesthesia Home Care Instructions  Activity: Get plenty of rest for the remainder of the day. A responsible individual must stay with you for 24 hours following the procedure.  For the next 24 hours, DO NOT: -Drive a car -Paediatric nurse -Drink alcoholic beverages -Take any medication unless instructed by your physician -Make any legal decisions or sign important papers.  Meals: Start with liquid foods such as gelatin or soup. Progress to regular foods as tolerated. Avoid greasy, spicy, heavy foods. If nausea and/or vomiting occur, drink only clear liquids until the nausea and/or vomiting subsides. Call your physician if vomiting continues.  Special Instructions/Symptoms: Your throat may feel dry or sore from the anesthesia or the breathing tube placed in your throat during surgery. If this causes discomfort, gargle with warm salt water. The discomfort should disappear within 24 hours.  If you had a scopolamine patch placed behind your ear for the management of post- operative nausea and/or vomiting:  1. The medication in the patch is effective for 72 hours, after which it should be removed.  Wrap patch in a tissue and discard in the trash. Wash hands thoroughly with soap and water. 2. You may remove the patch earlier than 72 hours if you experience unpleasant side effects which may include dry mouth, dizziness or visual disturbances. 3. Avoid touching the patch. Wash your hands with soap and water after contact with the patch.

## 2021-12-01 NOTE — Op Note (Signed)
LEFT BREAST RADIOACTIVE SEED GUIDED LUMPECTOMY  Procedure Note  April Weber 12/01/2021   Pre-op Diagnosis: LEFT BREAST COMPLEX SCLEROSING LESION     Post-op Diagnosis: same  Procedure(s): LEFT BREAST RADIOACTIVE SEED GUIDED LUMPECTOMY  Surgeon(s): Coralie Keens, MD  Anesthesia: General  Staff:  Circulator: Izora Ribas, RN Scrub Person: Doug Sou E  Estimated Blood Loss: Minimal               Specimens: sent to path  Indications: This is a 76 year old female has had a prior right mastectomy for breast cancer who had abnormality seen on her left breast on screening mammography.  2 areas were biopsied.  1 showed fibrocystic changes and the other showed a complex sclerosing lesion.  The decision was made to proceed with a radioactive seed guided left breast lumpectomy to remove the complex sclerosing lesion  Procedure: The patient was brought to the operating room identifies correct patient.  She is placed upon the operating table general anesthesia was induced.  Her left breast was then prepped and draped in usual sterile fashion.  Using the neoprobe the radioactive seed was located in the lower outer quadrant of the left breast in the deep tissue.  I anesthetized skin over the area with Marcaine and then made a longitudinal incision with a scalpel.  I then dissected down to the breast tissue with electrocautery.  With the aid of the neoprobe I then performed a wide lumpectomy going down to the level of the chest wall staying around the radioactive seed.  I then completed lumpectomy with the cautery.  The specimen was then completely removed.  All margins were marked with paint.  An x-ray was then performed on specimen confirming the radioactive seed and previous biopsy clip were in the specimen.  The specimen was sent to pathology for evaluation.  I achieved hemostasis in the lumpectomy cavity with the cautery.  I injected further Marcaine into the area.  I then closed  the subcutaneous tissue with interrupted 3-0 Vicryl sutures and closed the skin with a running 4-0 Monocryl.  Dermabond was then applied.  The patient tolerated the procedure well.  All the counts were correct at the end of the procedure.  The patient was then extubated in the operating room and taken in stable condition to the recovery room.          Coralie Keens   Date: 12/01/2021  Time: 9:53 AM

## 2021-12-01 NOTE — Interval H&P Note (Signed)
History and Physical Interval Note:no change in H and P  12/01/2021 8:21 AM  April Weber  has presented today for surgery, with the diagnosis of LEFT BREAST COMPLEX SCLEROSING LESION.  The various methods of treatment have been discussed with the patient and family. After consideration of risks, benefits and other options for treatment, the patient has consented to  Procedure(s) with comments: LEFT BREAST RADIOACTIVE SEED GUIDED LUMPECTOMY (Left) - LMA as a surgical intervention.  The patient's history has been reviewed, patient examined, no change in status, stable for surgery.  I have reviewed the patient's chart and labs.  Questions were answered to the patient's satisfaction.     Coralie Keens

## 2021-12-01 NOTE — Anesthesia Procedure Notes (Signed)
Procedure Name: LMA Insertion Date/Time: 12/01/2021 9:18 AM  Performed by: Bufford Spikes, CRNAPre-anesthesia Checklist: Patient identified, Emergency Drugs available, Suction available and Patient being monitored Patient Re-evaluated:Patient Re-evaluated prior to induction Oxygen Delivery Method: Circle system utilized Preoxygenation: Pre-oxygenation with 100% oxygen Induction Type: IV induction Ventilation: Mask ventilation without difficulty LMA: LMA inserted LMA Size: 4.0 Number of attempts: 1 Placement Confirmation: positive ETCO2 Tube secured with: Tape Dental Injury: Teeth and Oropharynx as per pre-operative assessment

## 2021-12-01 NOTE — Anesthesia Postprocedure Evaluation (Signed)
Anesthesia Post Note  Patient: April Weber  Procedure(s) Performed: LEFT BREAST RADIOACTIVE SEED GUIDED LUMPECTOMY (Left: Breast)     Patient location during evaluation: PACU Anesthesia Type: General Level of consciousness: awake and alert Pain management: pain level controlled Vital Signs Assessment: post-procedure vital signs reviewed and stable Respiratory status: spontaneous breathing, nonlabored ventilation and respiratory function stable Cardiovascular status: blood pressure returned to baseline Postop Assessment: no apparent nausea or vomiting Anesthetic complications: no   No notable events documented.  Last Vitals:  Vitals:   12/01/21 1000 12/01/21 1015  BP: 118/60 121/65  Pulse: 71 66  Resp: 15 14  Temp: (!) 36.4 C   SpO2: 99% 99%    Last Pain:  Vitals:   12/01/21 1015  TempSrc:   PainSc: 0-No pain                 Marthenia Rolling

## 2021-12-01 NOTE — Transfer of Care (Signed)
Immediate Anesthesia Transfer of Care Note  Patient: April Weber  Procedure(s) Performed: LEFT BREAST RADIOACTIVE SEED GUIDED LUMPECTOMY (Left: Breast)  Patient Location: PACU  Anesthesia Type:General  Level of Consciousness: awake, alert  and oriented  Airway & Oxygen Therapy: Patient Spontanous Breathing and Patient connected to face mask oxygen  Post-op Assessment: Report given to RN and Post -op Vital signs reviewed and stable  Post vital signs: Reviewed and stable  Last Vitals:  Vitals Value Taken Time  BP 121/65 12/01/21 1018  Temp 36.4 C 12/01/21 1000  Pulse 65 12/01/21 1019  Resp 10 12/01/21 1019  SpO2 99 % 12/01/21 1019  Vitals shown include unvalidated device data.  Last Pain:  Vitals:   12/01/21 1015  TempSrc:   PainSc: 0-No pain         Complications: No notable events documented.

## 2021-12-02 ENCOUNTER — Encounter (HOSPITAL_BASED_OUTPATIENT_CLINIC_OR_DEPARTMENT_OTHER): Payer: Self-pay | Admitting: Surgery

## 2021-12-03 LAB — SURGICAL PATHOLOGY

## 2021-12-08 ENCOUNTER — Encounter (HOSPITAL_COMMUNITY): Payer: Self-pay

## 2022-01-22 ENCOUNTER — Ambulatory Visit (INDEPENDENT_AMBULATORY_CARE_PROVIDER_SITE_OTHER): Payer: Medicare Other | Admitting: Family Medicine

## 2022-01-22 ENCOUNTER — Encounter: Payer: Self-pay | Admitting: Family Medicine

## 2022-01-22 VITALS — BP 125/79 | HR 72 | Temp 97.9°F | Ht 67.0 in | Wt 176.0 lb

## 2022-01-22 DIAGNOSIS — K2900 Acute gastritis without bleeding: Secondary | ICD-10-CM

## 2022-01-22 MED ORDER — PANTOPRAZOLE SODIUM 40 MG PO TBEC: 40.0000 mg | DELAYED_RELEASE_TABLET | Freq: Every day | ORAL | 0 refills | Status: DC

## 2022-01-22 NOTE — Patient Instructions (Signed)
Peptic Ulcer  A peptic ulcer is a painful sore in the lining of your stomach or the first part of your small intestine. What are the causes? Common causes of this condition include: An infection. Using certain pain medicines too often or too much. Rare tumors in the stomach, small intestine, or pancreas. What increases the risk? You are more likely to get this condition if you: Smoke. Have a family history of ulcer disease. Drink alcohol. Have been hospitalized in an intensive care unit (ICU). What are the signs or symptoms? Symptoms include: Burning pain in the area between the chest and the belly button. The pain may: Not go away (be persistent). Be worse when your stomach is empty. Be worse at night. Heartburn. Feeling sick to your stomach (nauseous) and throwing up (vomiting). Bloating. If the ulcer results in bleeding, it can cause you to: Have poop (stool) that is black and looks like tar. Throw up bright red blood. Throw up material that looks like coffee grounds. How is this treated? Treatment for this condition may include: Stopping things that can cause the ulcer, such as: Smoking. Using pain medicines. Drinking alcohol or caffeine. Medicines to reduce stomach acid. Antibiotic medicines if the ulcer is caused by an infection. A procedure that is done using a small, flexible tube that has a camera at the end (upper endoscopy). This may be done if you have a bleeding ulcer. Surgery. This may be needed if: You have a lot of bleeding. The ulcer caused a hole somewhere in the digestive system. Follow these instructions at home: Do not drink alcohol if your doctor tells you not to drink. Limit how much caffeine you take in. Do not smoke or use any products that contain nicotine or tobacco. If you need help quitting, ask your doctor. Take over-the-counter and prescription medicines only as told by your doctor. Do not stop or change your medicines unless you talk with  your doctor about it first. Do not take aspirin, ibuprofen, or other NSAIDs unless your doctor told you to do so. Keep all follow-up visits. Contact a doctor if: You do not get better in 7 days after you start treatment. You keep having an upset stomach (indigestion) or heartburn. Get help right away if: You have sudden, sharp pain in your belly (abdomen). You have belly pain that does not go away. You have bloody poop (stool) or black, tarry poop. You throw up blood. It may look like coffee grounds. You feel light-headed or feel like you may pass out (faint). You get weak. You get sweaty or feel sticky and cold to the touch (clammy). These symptoms may be an emergency. Get help right away. Call 911. Do not wait to see if the symptoms will go away. Do not drive yourself to the hospital. Summary Symptoms of a peptic ulcer include burning pain in the area between the chest and the belly button. Do not smoke or use any products that contain nicotine or tobacco. If you need help quitting, ask your doctor. Take medicines only as told by your doctor. Limit how much alcohol and caffeine you have. Keep all follow-up visits. This information is not intended to replace advice given to you by your health care provider. Make sure you discuss any questions you have with your health care provider. Document Revised: 12/05/2020 Document Reviewed: 12/05/2020 Elsevier Patient Education  2023 Elsevier Inc.  

## 2022-01-22 NOTE — Progress Notes (Signed)
Subjective: CC: GI discomfort PCP: [name], DO [id number] April Weber is April 76 y.o. female presenting to clinic today for:  1.  Bloating, abdominal pain Patient reports about April 6 to 8-week history of feeling bloated, having some epigastric discomfort.  She attributes this to use of Fosamax because she read that basically these are the side effects that Fosamax causes.  She has been on Fosamax for about 2 years now.  She denies any significant changes to diet or activity and in fact has been working to lose weight.  She drinks only 1 cup of coffee per day and takes multivitamins but really does not eat that much more acidic foods besides an occasional pickled beet.  She does not take any acid reflux medicines does not necessarily report any GERD into the throat.  No blood in stool.  Reports occasional constipation.  She is considered Prolia but is not quite sure that that is something that she wants to start either.  She would like to take April 6-m hiatus from the bisphosphonate in efforts to see if this is the cause of her symptoms.   ROS: Per HPI  Allergies  Allergen Reactions   Codeine     REACTION: makes sick   Doxycycline     REACTION: whelps hives   Iodine     REACTION: breakout   Penicillins     REACTION: whelps hives   Past Medical History:  Diagnosis Date   Breast cancer ([place]) 1980   RIGHT   Chronic kidney disease    has one kidney   DVT (deep venous thrombosis) ([facility])    on RIGHT leg-hx of   Hyperlipidemia    on meds   Hypertension    on meds   Osteopenia    PONV (postoperative nausea and vomiting)    Seasonal allergies    Thyroid disease    on meds    Current Outpatient Medications:    amLODipine (NORVASC) 5 MG tablet, Take 1 tablet (5 mg total) by mouth daily., Disp: 90 tablet, Rfl: 3   aspirin 81 MG EC tablet, Take 81 mg by mouth daily., Disp: , Rfl:    atenolol (TENORMIN) 25 MG tablet, Take 1 tablet (25 mg total) by mouth daily., Disp: 90 tablet, Rfl:  3   Calcium 600-200 MG-UNIT per tablet, Take 1 tablet by mouth 2 (two) times daily., Disp: , Rfl:    levothyroxine (SYNTHROID) 100 MCG tablet, Take 1/2 tablet on Sundays and 1 tablet daily all other days, Disp: 90 tablet, Rfl: 3   loratadine (CLARITIN) 10 MG tablet, Take 10 mg by mouth daily., Disp: , Rfl:    losartan (COZAAR) 100 MG tablet, Take 1 tablet (100 mg total) by mouth daily., Disp: 90 tablet, Rfl: 3   Multiple Vitamin (MULTIVITAMIN) capsule, Take 1 capsule by mouth daily., Disp: , Rfl:    simvastatin (ZOCOR) 20 MG tablet, Take 1 tablet (20 mg total) by mouth at bedtime. Cancel rx for '10mg'$ ., Disp: 90 tablet, Rfl: 3   triamcinolone (NASACORT) 55 MCG/ACT AERO nasal inhaler, Place 2 sprays into the nose daily., Disp: , Rfl:    alendronate (FOSAMAX) 70 MG tablet, Take 1 tablet (70 mg total) by mouth every 7 (seven) days. Take with April full glass of water on an empty stomach. (Patient not taking: Reported on 01/22/2022), Disp: 12 tablet, Rfl: 3 Social History   Socioeconomic History   Marital status: Single    Spouse name: Divorced    Number  of children: 1   Years of education: Not on file   Highest education level: Not on file  Occupational History   Occupation: Retired    Comment: worked at [employer] in [employer]  Tobacco Use   Smoking status: Former    Packs/day: 0.25    Types: Cigarettes    Quit date: 05/11/1967    Years since quitting: 54.7   Smokeless tobacco: Never  Vaping Use   Vaping Use: Never used  Substance and Sexual Activity   Alcohol use: No   Drug use: No   Sexual activity: Not Currently    Birth control/protection: Post-menopausal, Surgical    Comment: hyst  Other Topics Concern   Not on file  Social History Narrative   Lives alone - very close with her neighbors   Social Determinants of Health   Financial Resource Strain: Low Risk  (08/26/2021)   Overall Financial Resource Strain (CARDIA)    Difficulty of Paying Living  Expenses: Not hard at all  Food Insecurity: No Food Insecurity (08/26/2021)   Hunger Vital Sign    Worried About Running Out of Food in the Last Year: Never true    [place] in the Last Year: Never true  Transportation Needs: No Transportation Needs (08/26/2021)   PRAPARE - Hydrologist (Medical): No    Lack of Transportation (Non-Medical): No  Physical Activity: Sufficiently Active (08/26/2021)   Exercise Vital Sign    Days of Exercise per Week: 7 days    Minutes of Exercise per Session: 30 min  Stress: No Stress Concern Present (08/26/2021)   [place]    Feeling of Stress : Not at all  Social Connections: Moderately Integrated (08/26/2021)   Social Connection and Isolation Panel [NHANES]    Frequency of Communication with Friends and Family: More than three times April week    Frequency of Social Gatherings with Friends and Family: More than three times April week    Attends Religious Services: More than 4 times per year    Active Member of [employer] or Organizations: Yes    Attends Music therapist: More than 4 times per year    Marital Status: Divorced  Intimate Partner Violence: Not At Risk (08/26/2021)   Humiliation, Afraid, Rape, and Kick questionnaire    Fear of Current or Ex-Partner: No    Emotionally Abused: No    Physically Abused: No    Sexually Abused: No   Family History  Problem Relation Age of Onset   Heart attack Mother    Alcohol abuse Mother    Brain cancer Mother    Breast cancer Maternal Aunt    Colon polyps Neg Hx    Colon cancer Neg Hx    Esophageal cancer Neg Hx    Stomach cancer Neg Hx    Rectal cancer Neg Hx     Objective: Office vital signs reviewed. BP 125/79   Pulse 72   Temp 97.9 F (36.6 C)   Ht '5\' 7"'$  (1.702 m)   Wt 176 lb (79.8 kg)   SpO2 96%   BMI 27.57 kg/m   Physical Examination:  General: Awake, alert, well nourished, No acute  distress HEENT: sclera white, MMM Cardio: regular rate and rhythm Pulm:  normal work of breathing on room air GI: soft, epigastric tenderness is present.  No guarding or rebound, mildy-distended, bowel sounds present x4, no hepatomegaly, no splenomegaly,  no masses  Assessment/ Plan: 76 y.o. female   Acute gastritis without hemorrhage, unspecified gastritis type - Plan: pantoprazole (PROTONIX) 40 MG tablet  Frankly, I am worried that she has April peptic ulcer.  I agree that the bisphosphonate could certainly exacerbate something like this.  I have encouraged her to use PPI in efforts to help heal her gut.  We discussed red flag signs and symptoms.  Okay to pause with Fosamax for the next few weeks and we will plan to update DEXA soon.  We discussed that acidic foods could potentially exacerbate symptoms long-term.  May need to consider Prolia in the event that Fosamax in fact has caused the symptoms.  Alternatively if she wants to revisit bisphosphonates we could consider April once monthly like Actonel  No orders of the defined types were placed in this encounter.  No orders of the defined types were placed in this encounter.  Total time spent with patient 26 mins.  Greater than 50% of encounter spent in coordination of care/counseling.   [name], DO [place] [phone]

## 2022-02-08 ENCOUNTER — Telehealth: Payer: Self-pay | Admitting: Family Medicine

## 2022-02-08 NOTE — Telephone Encounter (Signed)
Pt aware to keep on hand as prn

## 2022-02-08 NOTE — Telephone Encounter (Signed)
Patient states that she is feeling better and wants to know if she needs to continue to take pantoprazole (PROTONIX) 40 MG tablet or if she can stop at this time. Please call back and advise.

## 2022-02-16 DIAGNOSIS — Z23 Encounter for immunization: Secondary | ICD-10-CM | POA: Diagnosis not present

## 2022-02-22 ENCOUNTER — Telehealth: Payer: Self-pay | Admitting: Family Medicine

## 2022-02-22 DIAGNOSIS — Z23 Encounter for immunization: Secondary | ICD-10-CM | POA: Diagnosis not present

## 2022-02-22 NOTE — Telephone Encounter (Signed)
Pt recently coming off of fosamax- states Actonel is 30$ a pill and states this is not an option for her. She will be going back on fosamax in january

## 2022-03-09 ENCOUNTER — Telehealth: Payer: Self-pay | Admitting: Family Medicine

## 2022-03-09 DIAGNOSIS — K2900 Acute gastritis without bleeding: Secondary | ICD-10-CM

## 2022-03-09 MED ORDER — PANTOPRAZOLE SODIUM 40 MG PO TBEC: 40.0000 mg | DELAYED_RELEASE_TABLET | Freq: Every day | ORAL | 0 refills | Status: DC

## 2022-03-09 NOTE — Addendum Note (Signed)
Addended by: Everlean Cherry on: 03/09/2022 01:02 PM   Modules accepted: Orders

## 2022-03-09 NOTE — Telephone Encounter (Signed)
Yes, resume the pantoprazole.  Ok to try every other day if that will control her symptoms, otherwise daily until our follow up.

## 2022-03-09 NOTE — Telephone Encounter (Signed)
Pt took pantoprazole for 2 weeks and it has helped. Stopped taking and now the bloated sensation is back and wants to know if you want her to keep taking pantoprazole until January appt or is there something else she needs to do? Do you want her to take this every day, every other day??

## 2022-03-09 NOTE — Telephone Encounter (Signed)
Pt aware of recommendations

## 2022-03-10 DIAGNOSIS — C569 Malignant neoplasm of unspecified ovary: Secondary | ICD-10-CM

## 2022-03-10 HISTORY — DX: Malignant neoplasm of unspecified ovary: C56.9

## 2022-03-15 ENCOUNTER — Telehealth: Payer: Self-pay | Admitting: Family Medicine

## 2022-03-15 DIAGNOSIS — K2900 Acute gastritis without bleeding: Secondary | ICD-10-CM

## 2022-03-15 NOTE — Telephone Encounter (Signed)
That sounds awful.  If she doesn't have a GI provider, I can refer her to one.  Just let me know where she wants to go.  If she is short of breath, I do Recommend eval ASAP. That is not normal.  If we have no DOD today, she may need to consider urgent care.

## 2022-03-15 NOTE — Telephone Encounter (Signed)
Okay to discontinue if not helping

## 2022-03-15 NOTE — Telephone Encounter (Signed)
Pt states she has gained 14lbs and she is so bloated. She cannot wear bras, pants. She almost called the ambulance Saturday night because she felt like she could not catch her breathe. States she has been taking pantoprazole but it does not seem to help. She has tried gas-x as well and it did not help either

## 2022-03-15 NOTE — Telephone Encounter (Signed)
Pt wants to know if she should still be taking PPI. Please call back

## 2022-03-15 NOTE — Addendum Note (Signed)
Addended by: [name] on: 03/15/2022 01:00 PM   Modules accepted: Orders

## 2022-03-15 NOTE — Telephone Encounter (Signed)
Can you place referral to [facility] or [facility]?

## 2022-03-16 ENCOUNTER — Encounter (HOSPITAL_COMMUNITY): Payer: Self-pay

## 2022-03-16 ENCOUNTER — Inpatient Hospital Stay (HOSPITAL_BASED_OUTPATIENT_CLINIC_OR_DEPARTMENT_OTHER)
Admission: EM | Admit: 2022-03-16 | Discharge: 2022-03-19 | DRG: 749 | Disposition: A | Discharge status: Home / Self Care | Payer: Medicare Other | Attending: Family Medicine | Admitting: Family Medicine

## 2022-03-16 ENCOUNTER — Emergency Department (HOSPITAL_BASED_OUTPATIENT_CLINIC_OR_DEPARTMENT_OTHER): Payer: Medicare Other | Admitting: Radiology

## 2022-03-16 ENCOUNTER — Emergency Department (HOSPITAL_BASED_OUTPATIENT_CLINIC_OR_DEPARTMENT_OTHER): Payer: Medicare Other

## 2022-03-16 ENCOUNTER — Other Ambulatory Visit: Payer: Self-pay

## 2022-03-16 ENCOUNTER — Observation Stay (HOSPITAL_COMMUNITY): Payer: Medicare Other

## 2022-03-16 ENCOUNTER — Encounter (HOSPITAL_BASED_OUTPATIENT_CLINIC_OR_DEPARTMENT_OTHER): Payer: Self-pay | Admitting: Emergency Medicine

## 2022-03-16 DIAGNOSIS — I129 Hypertensive chronic kidney disease with stage 1 through stage 4 chronic kidney disease, or unspecified chronic kidney disease: Secondary | ICD-10-CM | POA: Diagnosis present

## 2022-03-16 DIAGNOSIS — Z853 Personal history of malignant neoplasm of breast: Secondary | ICD-10-CM | POA: Diagnosis not present

## 2022-03-16 DIAGNOSIS — Z1152 Encounter for screening for COVID-19: Secondary | ICD-10-CM | POA: Diagnosis not present

## 2022-03-16 DIAGNOSIS — J9811 Atelectasis: Secondary | ICD-10-CM | POA: Diagnosis present

## 2022-03-16 DIAGNOSIS — E785 Hyperlipidemia, unspecified: Secondary | ICD-10-CM | POA: Diagnosis present

## 2022-03-16 DIAGNOSIS — Z886 Allergy status to analgesic agent status: Secondary | ICD-10-CM

## 2022-03-16 DIAGNOSIS — Z808 Family history of malignant neoplasm of other organs or systems: Secondary | ICD-10-CM

## 2022-03-16 DIAGNOSIS — R188 Other ascites: Secondary | ICD-10-CM | POA: Diagnosis not present

## 2022-03-16 DIAGNOSIS — Z7982 Long term (current) use of aspirin: Secondary | ICD-10-CM

## 2022-03-16 DIAGNOSIS — Z79890 Hormone replacement therapy (postmenopausal): Secondary | ICD-10-CM

## 2022-03-16 DIAGNOSIS — M8580 Other specified disorders of bone density and structure, unspecified site: Secondary | ICD-10-CM | POA: Diagnosis present

## 2022-03-16 DIAGNOSIS — N281 Cyst of kidney, acquired: Secondary | ICD-10-CM | POA: Diagnosis not present

## 2022-03-16 DIAGNOSIS — Z79899 Other long term (current) drug therapy: Secondary | ICD-10-CM | POA: Diagnosis not present

## 2022-03-16 DIAGNOSIS — K668 Other specified disorders of peritoneum: Secondary | ICD-10-CM | POA: Diagnosis not present

## 2022-03-16 DIAGNOSIS — Z90710 Acquired absence of both cervix and uterus: Secondary | ICD-10-CM

## 2022-03-16 DIAGNOSIS — R0602 Shortness of breath: Secondary | ICD-10-CM | POA: Diagnosis not present

## 2022-03-16 DIAGNOSIS — E871 Hypo-osmolality and hyponatremia: Secondary | ICD-10-CM | POA: Diagnosis present

## 2022-03-16 DIAGNOSIS — Z86718 Personal history of other venous thrombosis and embolism: Secondary | ICD-10-CM

## 2022-03-16 DIAGNOSIS — N838 Other noninflammatory disorders of ovary, fallopian tube and broad ligament: Secondary | ICD-10-CM | POA: Diagnosis present

## 2022-03-16 DIAGNOSIS — N1831 Chronic kidney disease, stage 3a: Secondary | ICD-10-CM | POA: Diagnosis not present

## 2022-03-16 DIAGNOSIS — Z888 Allergy status to other drugs, medicaments and biological substances status: Secondary | ICD-10-CM

## 2022-03-16 DIAGNOSIS — E890 Postprocedural hypothyroidism: Secondary | ICD-10-CM

## 2022-03-16 DIAGNOSIS — C800 Disseminated malignant neoplasm, unspecified: Secondary | ICD-10-CM

## 2022-03-16 DIAGNOSIS — R1900 Intra-abdominal and pelvic swelling, mass and lump, unspecified site: Secondary | ICD-10-CM | POA: Diagnosis not present

## 2022-03-16 DIAGNOSIS — N179 Acute kidney failure, unspecified: Secondary | ICD-10-CM | POA: Diagnosis present

## 2022-03-16 DIAGNOSIS — C561 Malignant neoplasm of right ovary: Principal | ICD-10-CM | POA: Diagnosis present

## 2022-03-16 DIAGNOSIS — K219 Gastro-esophageal reflux disease without esophagitis: Secondary | ICD-10-CM | POA: Diagnosis present

## 2022-03-16 DIAGNOSIS — Z7983 Long term (current) use of bisphosphonates: Secondary | ICD-10-CM

## 2022-03-16 DIAGNOSIS — E039 Hypothyroidism, unspecified: Secondary | ICD-10-CM | POA: Diagnosis present

## 2022-03-16 DIAGNOSIS — I10 Essential (primary) hypertension: Secondary | ICD-10-CM | POA: Diagnosis present

## 2022-03-16 DIAGNOSIS — Z87891 Personal history of nicotine dependence: Secondary | ICD-10-CM

## 2022-03-16 DIAGNOSIS — Z8249 Family history of ischemic heart disease and other diseases of the circulatory system: Secondary | ICD-10-CM

## 2022-03-16 DIAGNOSIS — K7460 Unspecified cirrhosis of liver: Secondary | ICD-10-CM | POA: Diagnosis not present

## 2022-03-16 DIAGNOSIS — E860 Dehydration: Secondary | ICD-10-CM | POA: Diagnosis present

## 2022-03-16 DIAGNOSIS — R6881 Early satiety: Secondary | ICD-10-CM | POA: Diagnosis present

## 2022-03-16 DIAGNOSIS — R180 Malignant ascites: Secondary | ICD-10-CM | POA: Diagnosis not present

## 2022-03-16 DIAGNOSIS — Z803 Family history of malignant neoplasm of breast: Secondary | ICD-10-CM

## 2022-03-16 DIAGNOSIS — R140 Abdominal distension (gaseous): Secondary | ICD-10-CM | POA: Diagnosis not present

## 2022-03-16 DIAGNOSIS — Z880 Allergy status to penicillin: Secondary | ICD-10-CM

## 2022-03-16 DIAGNOSIS — Z452 Encounter for adjustment and management of vascular access device: Secondary | ICD-10-CM | POA: Diagnosis not present

## 2022-03-16 DIAGNOSIS — Z885 Allergy status to narcotic agent status: Secondary | ICD-10-CM

## 2022-03-16 DIAGNOSIS — Z9011 Acquired absence of right breast and nipple: Secondary | ICD-10-CM | POA: Diagnosis not present

## 2022-03-16 DIAGNOSIS — N83201 Unspecified ovarian cyst, right side: Secondary | ICD-10-CM | POA: Diagnosis not present

## 2022-03-16 DIAGNOSIS — Z881 Allergy status to other antibiotic agents status: Secondary | ICD-10-CM

## 2022-03-16 LAB — CBC
HCT: 45.7 % (ref 36.0–46.0)
Hemoglobin: 15.9 g/dL — ABNORMAL HIGH (ref 12.0–15.0)
MCH: 30.6 pg (ref 26.0–34.0)
MCHC: 34.8 g/dL (ref 30.0–36.0)
MCV: 88.1 fL (ref 80.0–100.0)
Platelets: 364 10*3/uL (ref 150–400)
RBC: 5.19 MIL/uL — ABNORMAL HIGH (ref 3.87–5.11)
RDW: 12.5 % (ref 11.5–15.5)
WBC: 14.5 10*3/uL — ABNORMAL HIGH (ref 4.0–10.5)
nRBC: 0 % (ref 0.0–0.2)

## 2022-03-16 LAB — COMPREHENSIVE METABOLIC PANEL
ALT: 12 U/L (ref 0–44)
AST: 22 U/L (ref 15–41)
Albumin: 3.8 g/dL (ref 3.5–5.0)
Alkaline Phosphatase: 62 U/L (ref 38–126)
Anion gap: 12 (ref 5–15)
BUN: 24 mg/dL — ABNORMAL HIGH (ref 8–23)
CO2: 18 mmol/L — ABNORMAL LOW (ref 22–32)
Calcium: 9.3 mg/dL (ref 8.9–10.3)
Chloride: 99 mmol/L (ref 98–111)
Creatinine, Ser: 1.8 mg/dL — ABNORMAL HIGH (ref 0.44–1.00)
GFR, Estimated: 29 mL/min — ABNORMAL LOW (ref >60)
Glucose, Bld: 109 mg/dL — ABNORMAL HIGH (ref 70–99)
Potassium: 4.3 mmol/L (ref 3.5–5.1)
Sodium: 129 mmol/L — ABNORMAL LOW (ref 135–145)
Total Bilirubin: 0.8 mg/dL (ref 0.3–1.2)
Total Protein: 6.5 g/dL (ref 6.5–8.1)

## 2022-03-16 LAB — TROPONIN I (HIGH SENSITIVITY)
Troponin I (High Sensitivity): 5 ng/L (ref <18)
Troponin I (High Sensitivity): 5 ng/L (ref <18)

## 2022-03-16 LAB — RESP PANEL BY RT-PCR (FLU A&B, COVID) ARPGX2
Influenza A by PCR: NEGATIVE
Influenza B by PCR: NEGATIVE
SARS Coronavirus 2 by RT PCR: NEGATIVE

## 2022-03-16 LAB — BRAIN NATRIURETIC PEPTIDE: B Natriuretic Peptide: 64.6 pg/mL (ref 0.0–100.0)

## 2022-03-16 LAB — CEA (ACCESS): CEA (CHCC): 1.24 ng/mL (ref 0.00–5.00)

## 2022-03-16 LAB — LIPASE, BLOOD: Lipase: 32 U/L (ref 11–51)

## 2022-03-16 MED ORDER — LOSARTAN POTASSIUM 25 MG PO TABS: 100.0000 mg | ORAL_TABLET | Freq: Every day | ORAL | Status: DC

## 2022-03-16 MED ORDER — SODIUM CHLORIDE 0.9 % IV SOLN: Freq: Once | INTRAVENOUS | Status: AC

## 2022-03-16 MED ORDER — LORATADINE 10 MG PO TABS: 10.0000 mg | ORAL_TABLET | Freq: Every day | ORAL | Status: DC

## 2022-03-16 MED ORDER — AMLODIPINE BESYLATE 5 MG PO TABS
5.0000 mg | ORAL_TABLET | Freq: Every day | ORAL | Status: DC
  Administered 2022-03-17 – 2022-03-18 (×2): 5 mg via ORAL
  Filled 2022-03-16 (×3): qty 1

## 2022-03-16 MED ORDER — LEVOTHYROXINE SODIUM 100 MCG PO TABS
100.0000 ug | ORAL_TABLET | Freq: Every day | ORAL | Status: DC
  Administered 2022-03-17 – 2022-03-19 (×3): 100 ug via ORAL
  Filled 2022-03-16 (×4): qty 1

## 2022-03-16 MED ORDER — ATENOLOL 25 MG PO TABS
25.0000 mg | ORAL_TABLET | Freq: Every day | ORAL | Status: DC
  Filled 2022-03-16: qty 1

## 2022-03-16 MED ORDER — PANTOPRAZOLE SODIUM 40 MG PO TBEC
40.0000 mg | DELAYED_RELEASE_TABLET | Freq: Every day | ORAL | Status: DC
  Filled 2022-03-16 (×2): qty 1

## 2022-03-16 MED ORDER — LOSARTAN POTASSIUM 50 MG PO TABS: 100.0000 mg | ORAL_TABLET | Freq: Every day | ORAL | Status: DC

## 2022-03-16 MED ORDER — SODIUM CHLORIDE 0.9 % IV BOLUS
1000.0000 mL | Freq: Once | INTRAVENOUS | Status: AC
Start: 2022-03-16 — End: 2022-03-16
  Administered 2022-03-16: 1000 mL via INTRAVENOUS

## 2022-03-16 MED ORDER — FENTANYL CITRATE PF 50 MCG/ML IJ SOSY
50.0000 ug | PREFILLED_SYRINGE | INTRAMUSCULAR | Status: DC | PRN
  Administered 2022-03-16: 50 ug via INTRAVENOUS
  Filled 2022-03-16: qty 1

## 2022-03-16 MED ORDER — ASPIRIN 81 MG PO TBEC: 81.0000 mg | DELAYED_RELEASE_TABLET | Freq: Every day | ORAL | Status: DC

## 2022-03-16 MED ORDER — SIMVASTATIN 20 MG PO TABS
20.0000 mg | ORAL_TABLET | Freq: Every day | ORAL | Status: DC
  Administered 2022-03-17 – 2022-03-18 (×3): 20 mg via ORAL
  Filled 2022-03-16 (×3): qty 1

## 2022-03-16 MED ORDER — TRIAMCINOLONE ACETONIDE 55 MCG/ACT NA AERO
2.0000 | INHALATION_SPRAY | Freq: Every day | NASAL | Status: DC
  Filled 2022-03-16: qty 21.6

## 2022-03-16 NOTE — ED Provider Triage Note (Signed)
Emergency Medicine Provider Triage Evaluation Note  April Weber , a 76 y.o. female  was evaluated in triage.  Pt complains of abd distention and SOB for 1 week seems worse over the past couple days. No fevers. No hemoptysis. Seems she has had weight gain and feels her abd is much more distended. Makes it difficult for her to lay back. Daughter states pt has no major medical problems (seems she has HTN, breast CA, DVT hx).  Review of Systems  Positive: SOB, abd distention Negative: Fever   Physical Exam  There were no vitals taken for this visit. Gen:   Awake, no distress   Resp:  Normal effort  MSK:   Moves extremities without difficulty  Other:  No LEE, no calf TTP, lungs without wheezing, abd somewhat protuberant w some distention.   Medical Decision Making  Medically screening exam initiated at 1:54 PM.  Appropriate orders placed.  April Weber was informed that the remainder of the evaluation will be completed by another provider, this initial triage assessment does not replace that evaluation, and the importance of remaining in the ED until their evaluation is complete.  Labs, EKG CXR   Tedd Sias, [place] 03/16/22 1356

## 2022-03-16 NOTE — ED Notes (Signed)
Unable to reach admitting provider regarding patients PO status. Continuing to hold patient's medications regarding this reason.

## 2022-03-16 NOTE — ED Notes (Signed)
[facility] arrived to transport pt. Pt stable at time of departure ?

## 2022-03-16 NOTE — ED Provider Notes (Signed)
[facility] EMERGENC[facility] DEPT Provider Note   CSN: [id number] Arrival date & time: 03/16/22  1332     History  Chief Complaint  Patient presents with   Shortness of Breath    April Weber is a 76 y.o. female.   Shortness of Breath  Patient is a 76 year old female with past medical history significant for osteopenia, single kidney secondary to complication of surgery with her hysterectomy, hypertension, superficial vein thrombosis years ago, HLD, breast cancer  Patient presents with her daughter to the emergency room with complaints of abdominal distention and states that she has had approximately 2 to 3 weeks of worsening abdominal distention and swelling and states that she feels that she has early satiety.  She will sometimes feel hungry but not have an appetite.  She denies any nausea or vomiting.  She denies any chest pain.  She states that she feels that her abdomen is now so swollen that she is having more difficulty breathing.  She states she has gained 14 pounds over the past 2 or 3 weeks.  She states she is still passing gas, no urinary frequency urgency dysuria or hematuria.  She states she is [place] her last bowel movement yesterday which was a normal bowel movement with no blood.  No recent surgeries, hospitalization, long travel, hemoptysis, estrogen containing OCP, cancer history.  No unilateral leg swelling.  No history of PE or VTE.       Home Medications Prior to Admission medications   Medication Sig Start Date End Date Taking? Authorizing Provider  alendronate (FOSAMAX) 70 MG tablet Take 1 tablet (70 mg total) by mouth every 7 (seven) days. Take with a full glass of water on an empty stomach. Patient not taking: Reported on 01/22/2022 11/16/21   [name], DO  amLODipine (NORVASC) 5 MG tablet Take 1 tablet (5 mg total) by mouth daily. 11/16/21   [name], DO  aspirin 81 MG EC tablet Take 81 mg by mouth daily.    [provider]  atenolol (TENORMIN) 25 MG tablet Take 1 tablet (25 mg total) by mouth daily. 11/16/21   [name], DO  Calcium 600-200 MG-UNIT per tablet Take 1 tablet by mouth 2 (two) times daily.    [provider]  levothyroxine (SYNTHROID) 100 MCG tablet Take 1/2 tablet on Sundays and 1 tablet daily all other days 11/16/21   Ronnie Doss M, DO  loratadine (CLARITIN) 10 MG tablet Take 10 mg by mouth daily.    [provider]  losartan (COZAAR) 100 MG tablet Take 1 tablet (100 mg total) by mouth daily. 11/16/21   [name], DO  Multiple Vitamin (MULTIVITAMIN) capsule Take 1 capsule by mouth daily.    [provider]  pantoprazole (PROTONIX) 40 MG tablet Take 1 tablet (40 mg total) by mouth daily. X2-4 weeks 03/09/22   Ronnie Doss M, DO  simvastatin (ZOCOR) 20 MG tablet Take 1 tablet (20 mg total) by mouth at bedtime. Cancel rx for '10mg'$ . 11/16/21   Ronnie Doss M, DO  triamcinolone (NASACORT) 55 MCG/ACT AERO nasal inhaler Place 2 sprays into the nose daily.    [provider]      Allergies    Codeine, Doxycycline, Iodine, and Penicillins    Review of Systems   Review of Systems  Respiratory:  Positive for shortness of breath.     Physical Exam Updated Vital Signs BP 128/72 (BP Location: Left Arm)   Pulse 75  Temp 98.2 F (36.8 C) (Oral)   Resp 19   Ht '5\' 6"'$  (1.676 m)   Wt 79.8 kg   SpO2 95%   BMI 28.41 kg/m  Physical Exam Vitals and nursing note reviewed.  Constitutional:      General: She is not in acute distress. HENT:     Head: Normocephalic and atraumatic.     Nose: Nose normal.  Eyes:     General: No scleral icterus. Cardiovascular:     Rate and Rhythm: Normal rate and regular rhythm.     Pulses: Normal pulses.     Heart sounds: Normal heart sounds.  Pulmonary:     Effort: Pulmonary effort is normal. No respiratory distress.     Breath sounds: No wheezing.  Abdominal:     General: There is  distension.     Palpations: Abdomen is soft.     Tenderness: There is abdominal tenderness. There is no guarding or rebound.     Comments: Somewhat distended abdomen that is soft but somewhat taut No focal tenderness but some discomfort with palpation across mid abdomen  Musculoskeletal:     Cervical back: Normal range of motion.     Right lower leg: No edema.     Left lower leg: No edema.  Skin:    General: Skin is warm and dry.     Capillary Refill: Capillary refill takes less than 2 seconds.  Neurological:     Mental Status: She is alert. Mental status is at baseline.  Psychiatric:        Mood and Affect: Mood normal.        Behavior: Behavior normal.     ED Results / Procedures / Treatments   Labs (all labs ordered are listed, but only abnormal results are displayed) Labs Reviewed  COMPREHENSIVE METABOLIC PANEL - Abnormal; Notable for the following components:      Result Value   Sodium 129 (*)    CO2 18 (*)    Glucose, Bld 109 (*)    BUN 24 (*)    Creatinine, Ser 1.80 (*)    GFR, Estimated 29 (*)    All other components within normal limits  CBC - Abnormal; Notable for the following components:   WBC 14.5 (*)    RBC 5.19 (*)    Hemoglobin 15.9 (*)    All other components within normal limits  RESP PANEL BY RT-PCR (FLU A&B, COVID) ARPGX2  LIPASE, BLOOD  BRAIN NATRIURETIC PEPTIDE  CEA (ACCESS)  URINALYSIS, ROUTINE W REFLEX MICROSCOPIC  CA 125  TROPONIN I (HIGH SENSITIVITY)  TROPONIN I (HIGH SENSITIVITY)    EKG EKG Interpretation  Date/Time:  Tuesday March 16 2022 13:54:04 EST Ventricular Rate:  86 PR Interval:  186 QRS Duration: 76 QT Interval:  360 QTC Calculation: 430 R Axis:   10 Text Interpretation: Normal sinus rhythm Low voltage QRS Cannot rule out Anterior infarct , age undetermined Abnormal ECG When compared with ECG of 30-Nov-2021 12:57, Minimal criteria for Anterior infarct are now Present Confirmed by Margaretmary Eddy [phone]) on 03/16/2022  4:32:58 PM  Radiology CT ABDOMEN PELVIS WO CONTRAST  Result Date: 03/16/2022 CLINICAL DATA:  Abdominal pain, shortness of breath, abdominal bloating and distention, weight gain * Tracking Code: BO * EXAM: CT ABDOMEN AND PELVIS WITHOUT CONTRAST TECHNIQUE: Multidetector CT imaging of the abdomen and pelvis was performed following the standard protocol without IV contrast. RADIATION DOSE REDUCTION: This exam was performed according to the departmental dose-optimization program which includes automated exposure  control, adjustment of the mA and/or kV according to patient size and/or use of iterative reconstruction technique. COMPARISON:  None Available. FINDINGS: Lower chest: No acute abnormality. Coronary artery calcifications. Scarring and or atelectasis of the lung bases. Hepatobiliary: Coarse shrunken cirrhotic morphology of the liver. No obvious solid liver abnormality is seen. No gallstones, gallbladder wall thickening, or biliary dilatation. Pancreas: Unremarkable. No pancreatic ductal dilatation or surrounding inflammatory changes. Spleen: Normal in size without significant abnormality. Adrenals/Urinary Tract: Adrenal glands are unremarkable. Severely atrophic left kidney. Simple, benign left renal cortical cysts, for which no further follow-up or characterization is required. The right kidney is normal in noncontrast appearance, without renal calculi, obvious solid lesion, or hydronephrosis. Bladder is unremarkable. Stomach/Bowel: Stomach is within normal limits. Large diverticulum of the descending duodenum. Appendix is not clearly visualized and may be surgically absent. No evidence of bowel wall thickening, distention, or inflammatory changes. Pancolonic diverticulosis. Vascular/Lymphatic: Aortic atherosclerosis. No enlarged abdominal or pelvic lymph nodes. Reproductive: Status post hysterectomy. Large, mixed solid and cystic mass in the low pelvis, difficult to clearly delineate from adjacent ascites  although at least 10.2 x 10.0 cm (series 2, image 75). Other: No abdominal wall hernia or abnormality. Large volume ascites throughout the abdomen and pelvis. Stranding and nodularity of the omentum and peritoneum, particularly in the ventral abdomen and left upper quadrant (series 2, image 25). Musculoskeletal: No acute or significant osseous findings. IMPRESSION: 1. Large, mixed solid and cystic mass in the low pelvis, difficult to clearly delineate from adjacent ascites although at least 10.2 x 10.0 cm. This is highly concerning for primary ovarian malignancy and most likely arises from the right ovary, although may involve both ovaries. This may be further evaluated by contrast enhanced MRI of the pelvis if desired. 2. Large volume ascites throughout the abdomen and pelvis, presumed malignant. Stranding and nodularity of the omentum and peritoneum, particularly in the ventral abdomen and left upper quadrant, highly suspicious for peritoneal metastatic disease. 3. Cirrhotic morphology of the liver, which may contribute to ascites. 4. Severely atrophic left kidney, consistent with remote prior infectious, obstructive, or ischemic insult. 5. Coronary artery disease. Aortic Atherosclerosis (ICD10-I70.0). Electronically Signed   By: Delanna Ahmadi M.D.   On: 03/16/2022 15:59   DG Chest 2 View  Result Date: 03/16/2022 CLINICAL DATA:  Shortness of breath EXAM: CHEST - 2 VIEW COMPARISON:  None Available. FINDINGS: Cardiac size is within normal limits. There are no signs of pulmonary edema or focal pulmonary consolidation. Linear densities in left lower lung fields may suggest scarring or subsegmental atelectasis. Left hemidiaphragm is elevated. There is no pleural effusion or pneumothorax. IMPRESSION: There are no signs of pulmonary edema or focal pulmonary consolidation. Small linear densities in left lower lung fields suggest scarring or subsegmental atelectasis. Electronically Signed   By: Elmer Picker M.D.    On: 03/16/2022 15:19   DG Abd 1 View  Result Date: 03/16/2022 CLINICAL DATA:  Abdominal distention EXAM: ABDOMEN - 1 VIEW COMPARISON:  None Available. FINDINGS: Bowel gas pattern is nonspecific. No abnormal masses or calcifications are seen. Kidneys are partly obscured by bowel contents. Vascular calcifications are seen. Marked degenerative changes are noted in lumbar spine. IMPRESSION: Nonspecific bowel gas pattern.  Lumbar spondylosis. Electronically Signed   By: Elmer Picker M.D.   On: 03/16/2022 15:12    Procedures Procedures    Medications Ordered in ED Medications  0.9 %  sodium chloride infusion (has no administration in time range)  amLODipine (NORVASC) tablet 5 mg (  5 mg Oral Not Given 03/16/22 1918)  aspirin EC tablet 81 mg (has no administration in time range)  atenolol (TENORMIN) tablet 25 mg (has no administration in time range)  levothyroxine (SYNTHROID) tablet 100 mcg (has no administration in time range)  loratadine (CLARITIN) tablet 10 mg (has no administration in time range)  pantoprazole (PROTONIX) EC tablet 40 mg (40 mg Oral Patient Refused/Not Given 03/16/22 1919)  simvastatin (ZOCOR) tablet 20 mg (has no administration in time range)  triamcinolone (NASACORT) nasal inhaler 2 spray (has no administration in time range)  losartan (COZAAR) tablet 100 mg (has no administration in time range)  fentaNYL (SUBLIMAZE) injection 50 mcg (has no administration in time range)  sodium chloride 0.9 % bolus 1,000 mL (1,000 mLs Intravenous New Bag/Given 03/16/22 1842)    ED Course/ Medical Decision Making/ A&P Clinical Course as of 03/16/22 1920  Tue Mar 16, 2022  1613 IMPRESSION: 1. Large, mixed solid and cystic mass in the low pelvis, difficult to clearly delineate from adjacent ascites although at least 10.2 x 10.0 cm. This is highly concerning for primary ovarian malignancy and most likely arises from the right ovary, although may involve both ovaries. This may be  further evaluated by contrast enhanced MRI of the pelvis if desired. 2. Large volume ascites throughout the abdomen and pelvis, presumed malignant. Stranding and nodularity of the omentum and peritoneum, particularly in the ventral abdomen and left upper quadrant, highly suspicious for peritoneal metastatic disease. 3. Cirrhotic morphology of the liver, which may contribute to ascites. 4. Severely atrophic left kidney, consistent with remote prior infectious, obstructive, or ischemic insult. 5. Coronary artery disease.   [WF]    Clinical Course User Index [WF] Tedd Sias, PA                           Medical Decision Making Amount and/or Complexity of Data Reviewed Labs: ordered. Radiology: ordered.  Risk OTC drugs. Prescription drug management. Decision regarding hospitalization.   This patient presents to the ED for concern of SOB, this involves a number of treatment options, and is a complaint that carries with it a high risk of complications and morbidity. A differential diagnosis was considered for the patient's symptoms which is discussed below:   The causes for shortness of breath include but are not limited to Cardiac (AHF, pericardial effusion and tamponade, arrhythmias, ischemia, etc) Respiratory (COPD, asthma, pneumonia, pneumothorax, primary pulmonary hypertension, PE/VQ mismatch) Hematological (anemia) Neuromuscular (ALS, Guillain-Barr, etc)  In this case pt is primary dyspneic 2/2 abd distention.    Co morbidities: Discussed in HPI   Brief History:  Patient is a 76 year old female with past medical history significant for osteopenia, single kidney secondary to complication of surgery with her hysterectomy, hypertension, superficial vein thrombosis years ago, HLD, breast cancer  Patient presents with her daughter to the emergency room with complaints of abdominal distention and states that she has had approximately 2 to 3 weeks of worsening  abdominal distention and swelling and states that she feels that she has early satiety.  She will sometimes feel hungry but not have an appetite.  She denies any nausea or vomiting.  She denies any chest pain.  She states that she feels that her abdomen is now so swollen that she is having more difficulty breathing.  She states she has gained 14 pounds over the past 2 or 3 weeks.  She states she is still passing gas, no urinary frequency urgency  dysuria or hematuria.  She states she is [place] her last bowel movement yesterday which was a normal bowel movement with no blood.  No recent surgeries, hospitalization, long travel, hemoptysis, estrogen containing OCP, cancer history.  No unilateral leg swelling.  No history of PE or VTE.   PE: notable for distended abd, lungs are clear.     EMR reviewed including pt PMHx, past surgical history and past visits to ER.   See HPI for more details   Lab Tests:   I ordered and independently interpreted labs. Labs notable for mild hyponatremia of 129, new aki w normal K+.  CBC w leukocytosis and hemogl elevation likely 2/2 dehydration.  BNP normal. Lipase WNLs. Trop WNLs.   CEA unremarkable.  COVID/Flu negative.    Imaging Studies:  Abnormal findings. I personally reviewed all imaging studies. Imaging notable for IMPRESSION: 1. Large, mixed solid and cystic mass in the low pelvis, difficult to clearly delineate from adjacent ascites although at least 10.2 x 10.0 cm. This is highly concerning for primary ovarian malignancy and most likely arises from the right ovary, although may involve both ovaries. This may be further evaluated by contrast enhanced MRI of the pelvis if desired. 2. Large volume ascites throughout the abdomen and pelvis, presumed malignant. Stranding and nodularity of the omentum and peritoneum, particularly in the ventral abdomen and left upper quadrant, highly suspicious for peritoneal metastatic disease. 3. Cirrhotic  morphology of the liver, which may contribute to ascites. 4. Severely atrophic left kidney, consistent with remote prior infectious, obstructive, or ischemic insult. 5. Coronary artery disease.     Cardiac Monitoring:  The patient was maintained on a cardiac monitor.  I personally viewed and interpreted the cardiac monitored which showed an underlying rhythm of: NSR EKG non-ischemic   Medicines ordered:  I ordered medication including 1LNS for dehydration  for fent for pain, home medications ordered.  Reevaluation of the patient after these medicines showed that the patient stayed the same I have reviewed the patients home medicines and have made adjustments as needed   Critical Interventions:     Consults/Attending Physician   I requested consultation with Dr Berline Lopes of oncology,  and discussed lab and imaging findings as well as pertinent plan - they recommend: US pelvis, CEA, CA125, may require diagnostic/therapeutic tap.  Discussed with Dr. Posey Pronto who will admit to hospital service.   I discussed this case with my attending physician who cosigned this note including patient's presenting symptoms, physical exam, and planned diagnostics and interventions. Attending physician stated agreement with plan or made changes to plan which were implemented.    Reevaluation:  After the interventions noted above I re-evaluated patient and found that they have :stayed the same   Social Determinants of Health:      Problem List / ED Course:  Pelvic mass - concern for malignancy.  AKI - likely 2/2 dehydration from early satiety.    Dispostion:  After consideration of the diagnostic results and the patients response to treatment, I feel that the patent would benefit from admission. Dr. Posey Pronto will admit.     Final Clinical Impression(s) / ED Diagnoses Final diagnoses:  Pelvic mass  Other ascites    Rx / DC Orders ED Discharge Orders     None         Tedd Sias, [place] 03/16/22 1920    Fransico Meadow, MD 03/17/22 1249

## 2022-03-16 NOTE — Progress Notes (Signed)
Plan of Care Note for accepted transfer  Patient: April Weber    TGG:[id number]  DOA: 03/16/2022     Facility requesting transfer: [facility] Requesting Provider: Tedd Sias, PA  Reason for transfer: Admission for shortness of breath, AKI, ascites and possibility months Facility course: Patient was admitted with complaints of abdominal distention ongoing for last 2 to 3 weeks associated with shortness of breath.  Also poor appetite.  Reported 14 pound weight gain. Found to have AKI, hyponatremia and normal LFTs.  Imaging shows evidence of a large possible right ovarian 10 x 10 cm mass along with ascites.  Also cirrhosis of the liver and atrophied left kidney. EDP discussed with medical oncology on-call who referred to GYN oncology and recommendation is to perform ultrasound pelvis, CEA and CA 125 markers. Patient currently recommended for admission for AKI and further work-up for ascites as well as ovarian mass.  Plan of care: The patient is accepted for admission to [place]  unit, at [facility].   Author: Berle Mull, MD  03/16/2022  Check [email or web address] for on-call coverage.  Nursing staff, Please call [facility] number on Amion as soon as patient's arrival, so appropriate admitting provider can evaluate the pt.

## 2022-03-16 NOTE — ED Triage Notes (Signed)
Pt from home, c/o of SOB that has gotten increasingly worse over the past 2 weeks and got worse last night after having a hard time catching her breath. . Pt also c/o  abdominal pain that is generalized. Pt adds that abdomen fells bloated and distended.  Pt states that she has gained about 14 lbs over the past few weeks. Pt denis any complications with bowel or urinary function.

## 2022-03-17 ENCOUNTER — Observation Stay (HOSPITAL_COMMUNITY): Payer: Medicare Other

## 2022-03-17 ENCOUNTER — Encounter (HOSPITAL_COMMUNITY): Payer: Self-pay | Admitting: Internal Medicine

## 2022-03-17 ENCOUNTER — Telehealth: Payer: Self-pay | Admitting: Family Medicine

## 2022-03-17 DIAGNOSIS — R188 Other ascites: Secondary | ICD-10-CM | POA: Diagnosis present

## 2022-03-17 DIAGNOSIS — Z853 Personal history of malignant neoplasm of breast: Secondary | ICD-10-CM | POA: Diagnosis not present

## 2022-03-17 DIAGNOSIS — R1900 Intra-abdominal and pelvic swelling, mass and lump, unspecified site: Secondary | ICD-10-CM | POA: Diagnosis present

## 2022-03-17 DIAGNOSIS — Z1152 Encounter for screening for COVID-19: Secondary | ICD-10-CM | POA: Diagnosis not present

## 2022-03-17 DIAGNOSIS — Z86718 Personal history of other venous thrombosis and embolism: Secondary | ICD-10-CM | POA: Diagnosis not present

## 2022-03-17 DIAGNOSIS — K219 Gastro-esophageal reflux disease without esophagitis: Secondary | ICD-10-CM | POA: Diagnosis present

## 2022-03-17 DIAGNOSIS — R6881 Early satiety: Secondary | ICD-10-CM | POA: Diagnosis present

## 2022-03-17 DIAGNOSIS — E039 Hypothyroidism, unspecified: Secondary | ICD-10-CM

## 2022-03-17 DIAGNOSIS — E860 Dehydration: Secondary | ICD-10-CM | POA: Diagnosis present

## 2022-03-17 DIAGNOSIS — K668 Other specified disorders of peritoneum: Secondary | ICD-10-CM | POA: Diagnosis present

## 2022-03-17 DIAGNOSIS — N1831 Chronic kidney disease, stage 3a: Secondary | ICD-10-CM | POA: Diagnosis present

## 2022-03-17 DIAGNOSIS — K7460 Unspecified cirrhosis of liver: Secondary | ICD-10-CM | POA: Diagnosis present

## 2022-03-17 DIAGNOSIS — C800 Disseminated malignant neoplasm, unspecified: Secondary | ICD-10-CM | POA: Diagnosis not present

## 2022-03-17 DIAGNOSIS — E785 Hyperlipidemia, unspecified: Secondary | ICD-10-CM | POA: Diagnosis present

## 2022-03-17 DIAGNOSIS — I129 Hypertensive chronic kidney disease with stage 1 through stage 4 chronic kidney disease, or unspecified chronic kidney disease: Secondary | ICD-10-CM | POA: Diagnosis present

## 2022-03-17 DIAGNOSIS — N83201 Unspecified ovarian cyst, right side: Secondary | ICD-10-CM | POA: Diagnosis not present

## 2022-03-17 DIAGNOSIS — R180 Malignant ascites: Secondary | ICD-10-CM | POA: Diagnosis not present

## 2022-03-17 DIAGNOSIS — I10 Essential (primary) hypertension: Secondary | ICD-10-CM | POA: Diagnosis not present

## 2022-03-17 DIAGNOSIS — N179 Acute kidney failure, unspecified: Secondary | ICD-10-CM | POA: Diagnosis present

## 2022-03-17 DIAGNOSIS — Z79890 Hormone replacement therapy (postmenopausal): Secondary | ICD-10-CM | POA: Diagnosis not present

## 2022-03-17 DIAGNOSIS — Z803 Family history of malignant neoplasm of breast: Secondary | ICD-10-CM | POA: Diagnosis not present

## 2022-03-17 DIAGNOSIS — C561 Malignant neoplasm of right ovary: Secondary | ICD-10-CM | POA: Diagnosis present

## 2022-03-17 DIAGNOSIS — E871 Hypo-osmolality and hyponatremia: Secondary | ICD-10-CM | POA: Diagnosis present

## 2022-03-17 DIAGNOSIS — N838 Other noninflammatory disorders of ovary, fallopian tube and broad ligament: Secondary | ICD-10-CM | POA: Diagnosis not present

## 2022-03-17 DIAGNOSIS — J9811 Atelectasis: Secondary | ICD-10-CM | POA: Diagnosis present

## 2022-03-17 DIAGNOSIS — Z87891 Personal history of nicotine dependence: Secondary | ICD-10-CM | POA: Diagnosis not present

## 2022-03-17 DIAGNOSIS — Z90710 Acquired absence of both cervix and uterus: Secondary | ICD-10-CM | POA: Diagnosis not present

## 2022-03-17 DIAGNOSIS — M8580 Other specified disorders of bone density and structure, unspecified site: Secondary | ICD-10-CM | POA: Diagnosis present

## 2022-03-17 DIAGNOSIS — Z79899 Other long term (current) drug therapy: Secondary | ICD-10-CM | POA: Diagnosis not present

## 2022-03-17 DIAGNOSIS — Z452 Encounter for adjustment and management of vascular access device: Secondary | ICD-10-CM | POA: Diagnosis not present

## 2022-03-17 DIAGNOSIS — Z9011 Acquired absence of right breast and nipple: Secondary | ICD-10-CM | POA: Diagnosis not present

## 2022-03-17 LAB — GLUCOSE, PLEURAL OR PERITONEAL FLUID: Glucose, Fluid: 90 mg/dL

## 2022-03-17 LAB — GRAM STAIN: Gram Stain: NONE SEEN

## 2022-03-17 LAB — BASIC METABOLIC PANEL
Anion gap: 7 (ref 5–15)
BUN: 23 mg/dL (ref 8–23)
CO2: 17 mmol/L — ABNORMAL LOW (ref 22–32)
Calcium: 7.6 mg/dL — ABNORMAL LOW (ref 8.9–10.3)
Chloride: 109 mmol/L (ref 98–111)
Creatinine, Ser: 1.62 mg/dL — ABNORMAL HIGH (ref 0.44–1.00)
GFR, Estimated: 33 mL/min — ABNORMAL LOW (ref >60)
Glucose, Bld: 95 mg/dL (ref 70–99)
Potassium: 4 mmol/L (ref 3.5–5.1)
Sodium: 133 mmol/L — ABNORMAL LOW (ref 135–145)

## 2022-03-17 LAB — HEPATITIS PANEL, ACUTE
HCV Ab: NONREACTIVE
Hep A IgM: NONREACTIVE
Hep B C IgM: NONREACTIVE
Hepatitis B Surface Ag: NONREACTIVE

## 2022-03-17 LAB — URINALYSIS, ROUTINE W REFLEX MICROSCOPIC
Bilirubin Urine: NEGATIVE
Glucose, UA: NEGATIVE mg/dL
Ketones, ur: 5 mg/dL — AB
Leukocytes,Ua: NEGATIVE
Nitrite: NEGATIVE
Protein, ur: NEGATIVE mg/dL
Specific Gravity, Urine: 1.017 (ref 1.005–1.030)
pH: 5 (ref 5.0–8.0)

## 2022-03-17 LAB — LACTATE DEHYDROGENASE: LDH: 145 U/L (ref 98–192)

## 2022-03-17 LAB — BODY FLUID CELL COUNT WITH DIFFERENTIAL
Eos, Fluid: 0 %
Lymphs, Fluid: 1 %
Monocyte-Macrophage-Serous Fluid: 91 % — ABNORMAL HIGH (ref 50–90)
Neutrophil Count, Fluid: 8 % (ref 0–25)
Total Nucleated Cell Count, Fluid: 106 cu mm (ref 0–1000)

## 2022-03-17 LAB — ALBUMIN, PLEURAL OR PERITONEAL FLUID: Albumin, Fluid: 2.7 g/dL

## 2022-03-17 LAB — LACTATE DEHYDROGENASE, PLEURAL OR PERITONEAL FLUID: LD, Fluid: 100 U/L — ABNORMAL HIGH (ref 3–23)

## 2022-03-17 LAB — PROTEIN, PLEURAL OR PERITONEAL FLUID: Total protein, fluid: 3.8 g/dL

## 2022-03-17 MED ORDER — ALBUMIN HUMAN 25 % IV SOLN
50.0000 g | Freq: Once | INTRAVENOUS | Status: AC
  Administered 2022-03-17: 50 g via INTRAVENOUS
  Filled 2022-03-17: qty 200

## 2022-03-17 MED ORDER — SODIUM CHLORIDE 0.9% FLUSH
3.0000 mL | Freq: Two times a day (BID) | INTRAVENOUS | Status: DC
  Administered 2022-03-18: 3 mL via INTRAVENOUS

## 2022-03-17 MED ORDER — GADOBUTROL 1 MMOL/ML IV SOLN
8.0000 mL | Freq: Once | INTRAVENOUS | Status: AC | PRN
  Administered 2022-03-17: 8 mL via INTRAVENOUS

## 2022-03-17 MED ORDER — FENTANYL CITRATE PF 50 MCG/ML IJ SOSY
12.5000 ug | PREFILLED_SYRINGE | INTRAMUSCULAR | Status: DC | PRN
  Administered 2022-03-17 (×3): 50 ug via INTRAVENOUS
  Filled 2022-03-17 (×3): qty 1

## 2022-03-17 MED ORDER — ACETAMINOPHEN 325 MG PO TABS: 650.0000 mg | ORAL_TABLET | Freq: Four times a day (QID) | ORAL | Status: DC | PRN

## 2022-03-17 MED ORDER — ATENOLOL 25 MG PO TABS
12.5000 mg | ORAL_TABLET | Freq: Two times a day (BID) | ORAL | Status: DC
  Administered 2022-03-17 – 2022-03-18 (×3): 12.5 mg via ORAL
  Filled 2022-03-17 (×4): qty 1

## 2022-03-17 MED ORDER — LIDOCAINE HCL 1 % IJ SOLN
INTRAMUSCULAR | Status: AC
  Administered 2022-03-17: 15 mL
  Filled 2022-03-17: qty 20

## 2022-03-17 MED ORDER — ORAL CARE MOUTH RINSE: 15.0000 mL | OROMUCOSAL | Status: DC | PRN

## 2022-03-17 MED ORDER — ONDANSETRON HCL 4 MG/2ML IJ SOLN: 4.0000 mg | Freq: Four times a day (QID) | INTRAMUSCULAR | Status: DC | PRN

## 2022-03-17 MED ORDER — ACETAMINOPHEN 650 MG RE SUPP: 650.0000 mg | Freq: Four times a day (QID) | RECTAL | Status: DC | PRN

## 2022-03-17 MED ORDER — SODIUM CHLORIDE 0.9 % IV SOLN: INTRAVENOUS | Status: AC

## 2022-03-17 MED ORDER — ONDANSETRON HCL 4 MG PO TABS: 4.0000 mg | ORAL_TABLET | Freq: Four times a day (QID) | ORAL | Status: DC | PRN

## 2022-03-17 NOTE — Consult Note (Signed)
Gynecologic Oncology Consultation  April Weber 76 y.o. female  CC:  Chief Complaint  Patient presents with   Shortness of Breath    HPI: April Weber is a 76 year old female who presented to the ER at [facility] on 03/16/2022 with worsening dyspnea present for over 1 week, generalized abdominal pain, abdominal distension, and weight gain. In the ER, she underwent several imaging studies including a CT scan of the AP without contrast. CT findings included 1. Large, mixed solid and cystic mass in the low pelvis, difficult to clearly delineate from adjacent ascites although at least 10.2 x 10.0 cm. This is highly concerning for primary ovarian malignancy and most likely arises from the right ovary, although may involve both ovaries. This may be further evaluated by contrast enhanced MRI of the pelvis if desired. 2. Large volume ascites throughout the abdomen and pelvis, presumed malignant. Stranding and nodularity of the omentum and peritoneum, particularly in the ventral abdomen and left upper quadrant, highly suspicious for peritoneal metastatic disease. 3. Cirrhotic morphology of the liver, which may contribute to ascites. 4. Severely atrophic left kidney, consistent with remote prior infectious, obstructive, or ischemic insult. 5. Coronary artery disease. Aortic Atherosclerosis. A pelvic and transvaginal ultrasound was also performed showing the large mass appearing to come from the right adnexa with ascites with the recommendation from the radiologist for a MRI of the pelvis to further evaluate. The MRI was performed today resulting: There is a large solid and cystic mass within the right adnexa which measures up to 9.5 cm in maximum dimension. Findings are concerning for a primary ovarian neoplasm. 2. Large volume of ascites within the abdomen and pelvis. Given the presence of peritoneal nodules on the CT from the prior day findings are concerning for malignant ascites.  The patient  was transferred to [facility]. Today she underwent a paracentesis with fluid sent for cytology and additional lab studies. CEA 1.24 and CA 125 pending. Labs upon initial evaluation in the ER included Na+ 129, creatinine 1.80, WBC 14.5, Hgb 15.9, LDH 145.  Medical history includes HTN, breast cancer, CKD 3A, hypothyroidism, and DVT in the past no longer requiring anticoagulation. Her breast cancer history includes a recent lumpectomy with Dr. Ninfa Linden on 12/01/2021. Final path from this showing fibrocystic changes with adenosis, usual ductal hyperplasia, with no in situ or invasive carcinoma seen.   Interval History: In the middle of August 2023, she felt she had gastritis and the medicine prescribed by her PCP worked. Her symptoms were attributed to stress with breast workup she had over the summer. She continued to have bloating and after 3-4 days, the medications did not work. She was having more abdominal pain, couldn't breathe, gagging when eating, feeling fully quickly. She reports the abdominal fluid came on quickly. She has been unable to eat normally for the past 3 weeks. She has been on a low residue diet when she thought she had gastritis. She was eating grits, chicken noodle soup but would become full quickly. She had intermittent nausea for a few mornings with feelings of possible emesis. Bowels haven't moved since Tues morning and have been mushy, loose. Reports her bowels have not been normal since colonoscopy, having to have two preps back to back with results showing colon narrowing. She would typical have a BM every other day then diarrhea. Voiding normal today after fluids. She reports gaining 20 lbs in 2 weeks from the fluid.  Medical history includes right breast cancer diagnosed  40 years ago and had mastectomy with no treatment needed afterwards. PCP follows her for this. Her kidney was damaged during having a hysterectomy. She followed with Dr. Jeffie Pollock for this with no follow up  recommended since no function from the kidney. Denies any other medical problems.  Review of Systems: See HPI and Interval  Current Meds: Current inpatient meds reviewed  Allergy:  Allergies  Allergen Reactions   Codeine Nausea And Vomiting   Nsaids Other (See Comments)    Told to avoid due to kidney function   Penicillins Hives   Vibra-Tab [Doxycycline] Hives   Iodine Rash   Latex Rash    Occasionally gets a localized rash on contact with certain latex products    Social Hx:   Social History   Socioeconomic History   Marital status: Single    Spouse name: Divorced    Number of children: 1   Years of education: Not on file   Highest education level: Not on file  Occupational History   Occupation: Retired    Comment: worked at [employer] in [employer]  Tobacco Use   Smoking status: Former    Packs/day: 0.25    Types: Cigarettes    Quit date: 05/11/1967    Years since quitting: 54.8   Smokeless tobacco: Never  Vaping Use   Vaping Use: Never used  Substance and Sexual Activity   Alcohol use: No   Drug use: No   Sexual activity: Not Currently    Birth control/protection: Post-menopausal, Surgical    Comment: hyst  Other Topics Concern   Not on file  Social History Narrative   Lives alone - very close with her neighbors   Social Determinants of Health   Financial Resource Strain: Low Risk  (08/26/2021)   Overall Financial Resource Strain (CARDIA)    Difficulty of Paying Living Expenses: Not hard at all  Food Insecurity: No Food Insecurity (03/17/2022)   Hunger Vital Sign    Worried About Running Out of Food in the Last Year: Never true    [place] in the Last Year: Never true  Transportation Needs: No Transportation Needs (03/17/2022)   PRAPARE - Hydrologist (Medical): No    Lack of Transportation (Non-Medical): No  Physical Activity: Sufficiently Active (08/26/2021)   Exercise Vital Sign    Days  of Exercise per Week: 7 days    Minutes of Exercise per Session: 30 min  Stress: No Stress Concern Present (08/26/2021)   [place]    Feeling of Stress : Not at all  Social Connections: Moderately Integrated (08/26/2021)   Social Connection and Isolation Panel [NHANES]    Frequency of Communication with Friends and Family: More than three times a week    Frequency of Social Gatherings with Friends and Family: More than three times a week    Attends Religious Services: More than 4 times per year    Active Member of [employer] or Organizations: Yes    Attends Archivist Meetings: More than 4 times per year    Marital Status: Divorced  Intimate Partner Violence: Not At Risk (03/17/2022)   Humiliation, Afraid, Rape, and Kick questionnaire    Fear of Current or Ex-Partner: No    Emotionally Abused: No    Physically Abused: No    Sexually Abused: No    Past Surgical Hx:  Past Surgical History:  Procedure Laterality Date  ABDOMINAL HYSTERECTOMY     APPENDECTOMY     BREAST BIOPSY     BREAST LUMPECTOMY WITH RADIOACTIVE SEED LOCALIZATION Left 12/01/2021   Procedure: LEFT BREAST RADIOACTIVE SEED GUIDED LUMPECTOMY;  Surgeon: Coralie Keens, MD;  Location: [facility];  Service: General;  Laterality: Left;  LMA   COLONOSCOPY  2011   Dr.Kaplan-normal   EYE SURGERY     76 years old   MASTECTOMY Right [place]     THYROID SURGERY Bilateral 1967   VARICOSE VEIN SURGERY     ablation   WISDOM TOOTH EXTRACTION      Past Medical Hx:  Past Medical History:  Diagnosis Date   Breast cancer ([place]) 1980   RIGHT   Chronic kidney disease    has one kidney   DVT (deep venous thrombosis) ([facility])    on RIGHT leg-hx of   Hyperlipidemia    on meds   Hypertension    on meds   Osteopenia    PONV (postoperative nausea and vomiting)    Seasonal allergies    Thyroid disease    on meds     Family Hx:  Family History  Problem Relation Age of Onset   Heart attack Mother    Alcohol abuse Mother    Brain cancer Mother    Breast cancer Maternal Aunt    Colon polyps Neg Hx    Colon cancer Neg Hx    Esophageal cancer Neg Hx    Stomach cancer Neg Hx    Rectal cancer Neg Hx     Vitals:  Blood pressure 124/64, pulse 85, temperature 98.2 F (36.8 C), temperature source Oral, resp. rate 16, height '5\' 6"'$  (1.676 m), weight 185 lb (83.9 kg), SpO2 92 %.  Physical Exam:  Alert, oriented, in no acute distress, resting in bed with daughter at bedside See addition from Dr. Berline Lopes for exam  Assessment/Plan: 76 year old female currently admitted with imaging findings of a large pelvic mass, ascites, and findings suspicious for peritoneal metastatic disease. Findings concerning for at least Stage III ovarian cancer. CA 125 pending. Paracentesis performed today and cytology pending from this. Given extent of disease and nutritional status, recommendation is for neoadjuvant chemotherapy with imaging after 3 cycles and consideration for interval surgery after three cycles based on response.     Dorothyann Gibbs, NP 03/17/2022, 2:02 PM

## 2022-03-17 NOTE — Telephone Encounter (Signed)
Patient wants to talk to nurse about referral.

## 2022-03-17 NOTE — Hospital Course (Signed)
April Weber is a 76 y.o. is a female with a history of right breast cancer status post mastectomy, CKD stage IIIa, history of DVT not on anticoagulation, hypertension, hypothyroidism.  Patient presented secondary to abdominal distention and shortness of breath was found to have evidence of abdominal ascites in addition to a large right adnexal mass concerning for ovarian cancer.  Gynecology/oncology consulted and recommended MRI, CEA, CA 125.  MRI confirmed concern for primary right adnexal malignancy.  Paracentesis ordered for therapeutic/diagnostic purpose.

## 2022-03-17 NOTE — Telephone Encounter (Signed)
Lmtcb.

## 2022-03-17 NOTE — H&P (Signed)
History and Physical    April Weber TIR:[id number] DOB: 01-24-1946 DOA: 03/16/2022  PCP: [name], DO   Patient coming from: Home   Chief Complaint: SOB, abdominal distension   HPI: April Weber is a pleasant 76 y.o. female with medical history significant for right breast cancer status postmastectomy 40 years ago, CKD 3A, history of DVT no longer anticoagulated, hypertension, and hypothyroidism who presents emergency department with abdominal distention and shortness of breath.  Patient notes approximately 3 weeks of progressive abdominal distention and early satiety.  This is now causing shortness of breath and abdominal discomfort. He has not been eating or drinking much the past 2 weeks due to this.   [name] ED Course: Upon arrival to the ED, patient is found to be afebrile and saturating mid 90s on room air with stable blood pressure.  She is in sinus rhythm on EKG.  Chest x-ray with scar or atelectasis at the left base.  CT demonstrates large mixed solid and cystic mass in the low pelvis with ascites and stranding and nodularity of omentum and peritoneum highly concerning for ovarian malignancy.  Cirrhotic liver morphology also noted on CT.  Blood work notable for sodium 129, creatinine 1.80, and WBC 14,500.  COVID and influenza PCR were negative.  CEA is normal.  Gynecologic oncology was consulted by ED physician and patient was transferred to [facility] for admission.  Review of Systems:  All other systems reviewed and apart from HPI, are negative.  Past Medical History:  Diagnosis Date   Breast cancer ([place]) 1980   RIGHT   Chronic kidney disease    has one kidney   DVT (deep venous thrombosis) (HCC)    on RIGHT leg-hx of   Hyperlipidemia    on meds   Hypertension    on meds   Osteopenia    PONV (postoperative nausea and vomiting)    Seasonal allergies    Thyroid disease    on meds    Past Surgical History:  Procedure Laterality  Date   ABDOMINAL HYSTERECTOMY     APPENDECTOMY     BREAST BIOPSY     BREAST LUMPECTOMY WITH RADIOACTIVE SEED LOCALIZATION Left 12/01/2021   Procedure: LEFT BREAST RADIOACTIVE SEED GUIDED LUMPECTOMY;  Surgeon: Coralie Keens, MD;  Location: [facility];  Service: General;  Laterality: Left;  LMA   COLONOSCOPY  2011   Dr.Kaplan-normal   EYE SURGERY     76 years old   MASTECTOMY Right [place]     THYROID SURGERY Bilateral 1967   VARICOSE VEIN SURGERY     ablation   WISDOM TOOTH EXTRACTION      Social History:   reports that she quit smoking about 54 years ago. Her smoking use included cigarettes. She smoked an average of .25 packs per day. She has never used smokeless tobacco. She reports that she does not drink alcohol and does not use drugs.  Allergies  Allergen Reactions   Codeine     REACTION: makes sick   Doxycycline     REACTION: whelps hives   Iodine     REACTION: breakout   Penicillins     REACTION: whelps hives    Family History  Problem Relation Age of Onset   Heart attack Mother    Alcohol abuse Mother    Brain cancer Mother    Breast cancer Maternal Aunt    Colon polyps Neg Hx    Colon  cancer Neg Hx    Esophageal cancer Neg Hx    Stomach cancer Neg Hx    Rectal cancer Neg Hx      Prior to Admission medications   Medication Sig Start Date End Date Taking? Authorizing Provider  alendronate (FOSAMAX) 70 MG tablet Take 1 tablet (70 mg total) by mouth every 7 (seven) days. Take with a full glass of water on an empty stomach. Patient not taking: Reported on 01/22/2022 11/16/21   [name], DO  amLODipine (NORVASC) 5 MG tablet Take 1 tablet (5 mg total) by mouth daily. 11/16/21   [name], DO  aspirin 81 MG EC tablet Take 81 mg by mouth daily.    [provider]  atenolol (TENORMIN) 25 MG tablet Take 1 tablet (25 mg total) by mouth daily. 11/16/21   [name], DO  Calcium 600-200 MG-UNIT per  tablet Take 1 tablet by mouth 2 (two) times daily.    [provider]  levothyroxine (SYNTHROID) 100 MCG tablet Take 1/2 tablet on Sundays and 1 tablet daily all other days 11/16/21   Ronnie Doss M, DO  loratadine (CLARITIN) 10 MG tablet Take 10 mg by mouth daily.    [provider]  losartan (COZAAR) 100 MG tablet Take 1 tablet (100 mg total) by mouth daily. 11/16/21   [name], DO  Multiple Vitamin (MULTIVITAMIN) capsule Take 1 capsule by mouth daily.    [provider]  pantoprazole (PROTONIX) 40 MG tablet Take 1 tablet (40 mg total) by mouth daily. X2-4 weeks 03/09/22   Ronnie Doss M, DO  simvastatin (ZOCOR) 20 MG tablet Take 1 tablet (20 mg total) by mouth at bedtime. Cancel rx for '10mg'$ . 11/16/21   Ronnie Doss M, DO  triamcinolone (NASACORT) 55 MCG/ACT AERO nasal inhaler Place 2 sprays into the nose daily.    [provider]    Physical Exam: Vitals:   03/16/22 2015 03/16/22 2045 03/16/22 2115 03/16/22 2230  BP: 127/73 128/75 127/79 126/69  Pulse: 70 72 69 72  Resp: '13 14 15 14  '$ Temp:   98.1 F (36.7 C) 97.8 F (36.6 C)  TempSrc:   Oral Oral  SpO2: 94% 94% 95% 95%  Weight:    85.7 kg  Height:         Constitutional: NAD, calm  Eyes: PERTLA, lids and conjunctivae normal ENMT: Mucous membranes are moist. Posterior pharynx clear of any exudate or lesions.   Neck: supple, no masses  Respiratory:  no wheezing, no crackles. No accessory muscle use.  Cardiovascular: S1 & S2 heard, regular rate and rhythm. No extremity edema.   Abdomen: Distended, soft, no rebound pain or guarding. Bowel sounds active.  Musculoskeletal: no clubbing / cyanosis. No joint deformity upper and lower extremities.   Skin: no significant rashes, lesions, ulcers. Warm, dry, well-perfused. Neurologic: CN 2-12 grossly intact. Moving all extremities. Alert and oriented.  Psychiatric: Pleasant. Cooperative.    Labs and Imaging on Admission: I have  personally reviewed following labs and imaging studies  CBC: Recent Labs  Lab 03/16/22 1417  WBC 14.5*  HGB 15.9*  HCT 45.7  MCV 88.1  PLT 517   Basic Metabolic Panel: Recent Labs  Lab 03/16/22 1417  NA 129*  K 4.3  CL 99  CO2 18*  GLUCOSE 109*  BUN 24*  CREATININE 1.80*  CALCIUM 9.3   GFR: Estimated Creatinine Clearance: 28.9 mL/min (A) (by C-G formula based on SCr of 1.8 mg/dL (H)). Liver  Function Tests: Recent Labs  Lab 03/16/22 1417  AST 22  ALT 12  ALKPHOS 62  BILITOT 0.8  PROT 6.5  ALBUMIN 3.8   Recent Labs  Lab 03/16/22 1417  LIPASE 32   No results for input(s): "AMMONIA" in the last 168 hours. Coagulation Profile: No results for input(s): "INR", "PROTIME" in the last 168 hours. Cardiac Enzymes: No results for input(s): "CKTOTAL", "CKMB", "CKMBINDEX", "TROPONINI" in the last 168 hours. BNP (last 3 results) No results for input(s): "PROBNP" in the last 8760 hours. HbA1C: No results for input(s): "HGBA1C" in the last 72 hours. CBG: No results for input(s): "GLUCAP" in the last 168 hours. Lipid Profile: No results for input(s): "CHOL", "HDL", "LDLCALC", "TRIG", "CHOLHDL", "LDLDIRECT" in the last 72 hours. Thyroid Function Tests: No results for input(s): "TSH", "T4TOTAL", "FREET4", "T3FREE", "THYROIDAB" in the last 72 hours. Anemia Panel: No results for input(s): "VITAMINB12", "FOLATE", "FERRITIN", "TIBC", "IRON", "RETICCTPCT" in the last 72 hours. Urine analysis:    Component Value Date/Time   APPEARANCEUR Clear 01/30/2020 0805   GLUCOSEU Negative 01/30/2020 0805   BILIRUBINUR Negative 01/30/2020 0805   PROTEINUR 1+ (A) 01/30/2020 0805   NITRITE Negative 01/30/2020 0805   LEUKOCYTESUR 1+ (A) 01/30/2020 0805   Sepsis Labs: '@LABRCNTIP'$ (procalcitonin:4,lacticidven:4) ) Recent Results (from the past 240 hour(s))  Resp Panel by RT-PCR (Flu A&B, Covid) Anterior Nasal Swab     Status: None   Collection Time: 03/16/22  4:05 PM   Specimen:  Anterior Nasal Swab  Result Value Ref Range Status   SARS Coronavirus 2 by RT PCR NEGATIVE NEGATIVE Final    Comment: (NOTE) SARS-CoV-2 target nucleic acids are NOT DETECTED.  The SARS-CoV-2 RNA is generally detectable in upper respiratory specimens during the acute phase of infection. The lowest concentration of SARS-CoV-2 viral copies this assay can detect is 138 copies/mL. A negative result does not preclude SARS-Cov-2 infection and should not be used as the sole basis for treatment or other patient management decisions. A negative result may occur with  improper specimen collection/handling, submission of specimen other than nasopharyngeal swab, presence of viral mutation(s) within the areas targeted by this assay, and inadequate number of viral copies(<138 copies/mL). A negative result must be combined with clinical observations, patient history, and epidemiological information. The expected result is Negative.  Fact Sheet for Patients:  EntrepreneurPulse.com.au  Fact Sheet for Healthcare Providers:  IncredibleEmployment.be  This test is no t yet approved or cleared by the [place] FDA and  has been authorized for detection and/or diagnosis of SARS-CoV-2 by FDA under an Emergency Use Authorization (EUA). This EUA will remain  in effect (meaning this test can be used) for the duration of the COVID-19 declaration under Section 564(b)(1) of the Act, 21 U.S.C.section 360bbb-3(b)(1), unless the authorization is terminated  or revoked sooner.       Influenza A by PCR NEGATIVE NEGATIVE Final   Influenza B by PCR NEGATIVE NEGATIVE Final    Comment: (NOTE) The Xpert Xpress SARS-CoV-2/FLU/RSV plus assay is intended as an aid in the diagnosis of influenza from Nasopharyngeal swab specimens and should not be used as a sole basis for treatment. Nasal washings and aspirates are unacceptable for Xpert Xpress SARS-CoV-2/FLU/RSV testing.  Fact  Sheet for Patients: EntrepreneurPulse.com.au  Fact Sheet for Healthcare Providers: IncredibleEmployment.be  This test is not yet approved or cleared by the [place] FDA and has been authorized for detection and/or diagnosis of SARS-CoV-2 by FDA under an Emergency Use Authorization (EUA). This EUA will remain in  effect (meaning this test can be used) for the duration of the COVID-19 declaration under Section 564(b)(1) of the Act, 21 U.S.C. section 360bbb-3(b)(1), unless the authorization is terminated or revoked.  Performed at [facility], [address] [place], [place] [address]      Radiological Exams on Admission: US Transvaginal Non-OB  Result Date: 03/17/2022 CLINICAL DATA:  Pelvic mass. EXAM: TRANSABDOMINAL AND TRANSVAGINAL ULTRASOUND OF PELVIS TECHNIQUE: Both transabdominal and transvaginal ultrasound examinations of the pelvis were performed. Transabdominal technique was performed for global imaging of the pelvis including uterus, ovaries, adnexal regions, and pelvic cul-de-sac. It was necessary to proceed with endovaginal exam following the transabdominal exam to visualize the ovaries. COMPARISON:  03/16/2022. FINDINGS: Uterus Surgically absent. Right ovary There is a masslike region in the right adnexa extending into the midline measuring 9.2 x 8.7 x 8.7 cm with internal vascularity and may represent the right ovary. A 5.7 x 5.3 x 6.1 cm heterogenous hypoechoic region is noted within the mass. Left ovary Not visualized on exam. Other findings Moderate amount of free fluid in the pelvis, compatible with known ascites. IMPRESSION: 1. Large mass in the right adnexa extending in the midline with heterogeneous attenuation and internal vascularity measuring 9.2 x 8.7 x 8.7 cm. Findings may represent an ovarian mass versus other etiology. MRI with and without contrast is recommended for further characterization and OBGYN  consultation is recommended. 2. Left ovary is not visualized on exam. 3. Uterus is surgically absent. 4. Moderate amount of free fluid in the pelvis, compatible with known ascites. Electronically Signed   By: Brett Fairy M.D.   On: 03/17/2022 00:19   US Pelvis Complete  Result Date: 03/17/2022 CLINICAL DATA:  Pelvic mass. EXAM: TRANSABDOMINAL AND TRANSVAGINAL ULTRASOUND OF PELVIS TECHNIQUE: Both transabdominal and transvaginal ultrasound examinations of the pelvis were performed. Transabdominal technique was performed for global imaging of the pelvis including uterus, ovaries, adnexal regions, and pelvic cul-de-sac. It was necessary to proceed with endovaginal exam following the transabdominal exam to visualize the ovaries. COMPARISON:  03/16/2022. FINDINGS: Uterus Surgically absent. Right ovary There is a masslike region in the right adnexa extending into the midline measuring 9.2 x 8.7 x 8.7 cm with internal vascularity and may represent the right ovary. A 5.7 x 5.3 x 6.1 cm heterogeneous hypoechoic region is noted within the mass. Left ovary Not visualized on exam. Pulsed Doppler evaluation of the right ovary demonstrates normal low-resistance arterial and venous waveforms. Other findings Moderate amount of free fluid in the pelvis, likely related to known ascites. IMPRESSION: 1. Large mass in the right adnexa extending into the midline with heterogeneous attenuation and internal vascularity measuring 9.2 x 8.7 x 8.7 cm. Findings may represent ovarian mass versus other etiology. MRI with and without contrast is recommended for further characterization and OBGYN consultation is recommended. 2. Left ovary is not visualized on exam. 3. Uterus is surgically absent. 4. Moderate free fluid in the pelvis, compatible with known ascites. Electronically Signed   By: Brett Fairy M.D.   On: 03/17/2022 00:10   CT ABDOMEN PELVIS WO CONTRAST  Result Date: 03/16/2022 CLINICAL DATA:  Abdominal pain, shortness of  breath, abdominal bloating and distention, weight gain * Tracking Code: BO * EXAM: CT ABDOMEN AND PELVIS WITHOUT CONTRAST TECHNIQUE: Multidetector CT imaging of the abdomen and pelvis was performed following the standard protocol without IV contrast. RADIATION DOSE REDUCTION: This exam was performed according to the departmental dose-optimization program which includes automated exposure control, adjustment of the mA  and/or kV according to patient size and/or use of iterative reconstruction technique. COMPARISON:  None Available. FINDINGS: Lower chest: No acute abnormality. Coronary artery calcifications. Scarring and or atelectasis of the lung bases. Hepatobiliary: Coarse shrunken cirrhotic morphology of the liver. No obvious solid liver abnormality is seen. No gallstones, gallbladder wall thickening, or biliary dilatation. Pancreas: Unremarkable. No pancreatic ductal dilatation or surrounding inflammatory changes. Spleen: Normal in size without significant abnormality. Adrenals/Urinary Tract: Adrenal glands are unremarkable. Severely atrophic left kidney. Simple, benign left renal cortical cysts, for which no further follow-up or characterization is required. The right kidney is normal in noncontrast appearance, without renal calculi, obvious solid lesion, or hydronephrosis. Bladder is unremarkable. Stomach/Bowel: Stomach is within normal limits. Large diverticulum of the descending duodenum. Appendix is not clearly visualized and may be surgically absent. No evidence of bowel wall thickening, distention, or inflammatory changes. Pancolonic diverticulosis. Vascular/Lymphatic: Aortic atherosclerosis. No enlarged abdominal or pelvic lymph nodes. Reproductive: Status post hysterectomy. Large, mixed solid and cystic mass in the low pelvis, difficult to clearly delineate from adjacent ascites although at least 10.2 x 10.0 cm (series 2, image 75). Other: No abdominal wall hernia or abnormality. Large volume ascites  throughout the abdomen and pelvis. Stranding and nodularity of the omentum and peritoneum, particularly in the ventral abdomen and left upper quadrant (series 2, image 25). Musculoskeletal: No acute or significant osseous findings. IMPRESSION: 1. Large, mixed solid and cystic mass in the low pelvis, difficult to clearly delineate from adjacent ascites although at least 10.2 x 10.0 cm. This is highly concerning for primary ovarian malignancy and most likely arises from the right ovary, although may involve both ovaries. This may be further evaluated by contrast enhanced MRI of the pelvis if desired. 2. Large volume ascites throughout the abdomen and pelvis, presumed malignant. Stranding and nodularity of the omentum and peritoneum, particularly in the ventral abdomen and left upper quadrant, highly suspicious for peritoneal metastatic disease. 3. Cirrhotic morphology of the liver, which may contribute to ascites. 4. Severely atrophic left kidney, consistent with remote prior infectious, obstructive, or ischemic insult. 5. Coronary artery disease. Aortic Atherosclerosis (ICD10-I70.0). Electronically Signed   By: Delanna Ahmadi M.D.   On: 03/16/2022 15:59   DG Chest 2 View  Result Date: 03/16/2022 CLINICAL DATA:  Shortness of breath EXAM: CHEST - 2 VIEW COMPARISON:  None Available. FINDINGS: Cardiac size is within normal limits. There are no signs of pulmonary edema or focal pulmonary consolidation. Linear densities in left lower lung fields may suggest scarring or subsegmental atelectasis. Left hemidiaphragm is elevated. There is no pleural effusion or pneumothorax. IMPRESSION: There are no signs of pulmonary edema or focal pulmonary consolidation. Small linear densities in left lower lung fields suggest scarring or subsegmental atelectasis. Electronically Signed   By: Elmer Picker M.D.   On: 03/16/2022 15:19   DG Abd 1 View  Result Date: 03/16/2022 CLINICAL DATA:  Abdominal distention EXAM: ABDOMEN - 1  VIEW COMPARISON:  None Available. FINDINGS: Bowel gas pattern is nonspecific. No abnormal masses or calcifications are seen. Kidneys are partly obscured by bowel contents. Vascular calcifications are seen. Marked degenerative changes are noted in lumbar spine. IMPRESSION: Nonspecific bowel gas pattern.  Lumbar spondylosis. Electronically Signed   By: Elmer Picker M.D.   On: 03/16/2022 15:12    EKG: Independently reviewed. Sinus rhythm.   Assessment/Plan   1. Ovarian mass; ascites  - Large right adnexal mass with ascites and stranding and nodularity of omentum and peritoneum noted on imaging and  highly concerning for ovarian cancer  - MRI recommended by radiology  - CEA is normal, CA-125 pending  - Check MRI, consult IR for paracentesis with fluid analysis   2. AKI superimposed on CKD IIIa  - SCr is 1.80 on admission, up from baseline closer to 1-1.1   - Renally-dose medications, hold losartan, give albumin with paracentesis  3. Hyponatremia  - Serum sodium 129 in setting of not eating or drinking much in 2 wks; CT also concerning for cirrhosis and low effective arterial blood volume may be contributing    - Continue IVF hydration with NS, restrict free water, monitor    4. Hypertension  - Continue Norvasc and atenolol, hold losartan in light of AKI    5. Hypothyroidism  - Continue Synthroid   6. SOB - Secondary to ascites/abdominal distension  - IR consulted for paracentesis    DVT prophylaxis: SCDs  Code Status: Full  Level of Care: Level of care:  Family Communication: Daughter at bedside   Disposition Plan:  Patient is from: home  Anticipated d/c is to: Home  Anticipated d/c date is: Possibly as early as 03/17/22  Patient currently: Pending paracentesis Consults called: Gyn-onc  Admission status: Observation     Vianne Bulls, MD [facility]  03/17/2022, 12:26 AM

## 2022-03-17 NOTE — Plan of Care (Signed)
  Problem: Elimination: Goal: Will not experience complications related to bowel motility Outcome: Progressing Goal: Will not experience complications related to urinary retention Outcome: Progressing   Problem: Pain Managment: Goal: General experience of comfort will improve Outcome: Progressing   Problem: Safety: Goal: Ability to remain free from injury will improve Outcome: Progressing   

## 2022-03-17 NOTE — Progress Notes (Signed)
PROGRESS NOTE    ELISABEL HANOVER  VOJ:[id number] DOB: 16-Jul-1945 DOA: 03/16/2022 PCP: [name], DO   Brief Narrative: KAHLAN ENGEBRETSON is a 76 y.o. is a female with a history of right breast cancer status post mastectomy, CKD stage IIIa, history of DVT not on anticoagulation, hypertension, hypothyroidism.  Patient presented secondary to abdominal distention and shortness of breath was found to have evidence of abdominal ascites in addition to a large right adnexal mass concerning for ovarian cancer.  Gynecology/oncology consulted and recommended MRI, CEA, CA 125.  MRI confirmed concern for primary right adnexal malignancy.  Paracentesis ordered for therapeutic/diagnostic purpose.   Assessment and Plan:  Right ovarian mass Concern for ovarian malignancy. Gynecology oncology consulted and recommended CEA (normal) and CA-125. MRI obtained and is concerning for primary ovarian neoplasm. -Await gynecology/oncology recommendations -Follow-up CA-125  Ascites Concerning for malignant in setting of above. -Paracentesis today; follow up cytology  Dyspnea Likely secondary to ascites. No hypoxia.  AKI on CKD stage IIIa Baseline creatinine of about 1-1.1. Creatinine of 1.8 on admission. Losartan held. IV fluids given with improvement of creatinine to 1.62 this morning. -Continue IV fluids at reduced dose today  Hyponatremia Secondary to poor oral intake. Sodium of 129 on admission and has improved to 133 today with IV fluids. Patient with hypothyroidism as well. -Continue IV fluids.  Primary hypertension -Continue amlodipine and atenolol  Hypothyroidism -Continue Synthroid 100 mcg daily  Hyperlipidemia -Continue simvastatin 20 mg daily  GERD -Continue Protonix   DVT prophylaxis: SCDs Code Status:   Code Status: Full Code Family Communication: Daughter at bedside Disposition Plan: Discharge home likely in 1-2 days pending gynecology/oncology recommendations for  inpatient vs outpatient management   Consultants:  Gynecology/oncology  Procedures:  Paracentesis  Antimicrobials: None    Subjective: Patient reports dyspnea at rest.  Objective: BP 124/64 (BP Location: Left Arm)   Pulse 85   Temp 98.2 F (36.8 C) (Oral)   Resp 16   Ht '5\' 6"'$  (1.676 m)   Wt 83.9 kg   SpO2 92%   BMI 29.86 kg/m   Examination:  General exam: Appears calm and comfortable Respiratory system: Clear to auscultation. Respiratory effort normal. Cardiovascular system: S1 & S2 heard, RRR. No murmurs, rubs, gallops or clicks. Gastrointestinal system: Abdomen is distended, soft and non-tender. Normal bowel sounds heard. Central nervous system: Alert and oriented. No focal neurological deficits. Musculoskeletal: No calf tenderness Skin: No cyanosis. No rashes Psychiatry: Judgement and insight appear normal. Mood & affect appropriate.    Data Reviewed: I have personally reviewed following labs and imaging studies  CBC Lab Results  Component Value Date   WBC 14.5 (H) 03/16/2022   RBC 5.19 (H) 03/16/2022   HGB 15.9 (H) 03/16/2022   HCT 45.7 03/16/2022   MCV 88.1 03/16/2022   MCH 30.6 03/16/2022   PLT 364 03/16/2022   MCHC 34.8 03/16/2022   RDW 12.5 03/16/2022   LYMPHSABS 1.3 11/17/2020   EOSABS 0.3 11/17/2020   BASOSABS 0.1 99/37/1696     Last metabolic panel Lab Results  Component Value Date   NA 133 (L) 03/17/2022   K 4.0 03/17/2022   CL 109 03/17/2022   CO2 17 (L) 03/17/2022   BUN 23 03/17/2022   CREATININE 1.62 (H) 03/17/2022   GLUCOSE 95 03/17/2022   GFRNONAA 33 (L) 03/17/2022   GFRAA 61 05/20/2020   CALCIUM 7.6 (L) 03/17/2022   PHOS 3.6 11/05/2021   PROT 6.5 03/16/2022   ALBUMIN 3.8 03/16/2022  LABGLOB 2.5 05/13/2021   AGRATIO 1.7 05/13/2021   BILITOT 0.8 03/16/2022   ALKPHOS 62 03/16/2022   AST 22 03/16/2022   ALT 12 03/16/2022   ANIONGAP 7 03/17/2022    GFR: Estimated Creatinine Clearance: 31.7 mL/min (A) (by C-G formula  based on SCr of 1.62 mg/dL (H)).  Recent Results (from the past 240 hour(s))  Resp Panel by RT-PCR (Flu A&B, Covid) Anterior Nasal Swab     Status: None   Collection Time: 03/16/22  4:05 PM   Specimen: Anterior Nasal Swab  Result Value Ref Range Status   SARS Coronavirus 2 by RT PCR NEGATIVE NEGATIVE Final    Comment: (NOTE) SARS-CoV-2 target nucleic acids are NOT DETECTED.  The SARS-CoV-2 RNA is generally detectable in upper respiratory specimens during the acute phase of infection. The lowest concentration of SARS-CoV-2 viral copies this assay can detect is 138 copies/mL. A negative result does not preclude SARS-Cov-2 infection and should not be used as the sole basis for treatment or other patient management decisions. A negative result may occur with  improper specimen collection/handling, submission of specimen other than nasopharyngeal swab, presence of viral mutation(s) within the areas targeted by this assay, and inadequate number of viral copies(<138 copies/mL). A negative result must be combined with clinical observations, patient history, and epidemiological information. The expected result is Negative.  Fact Sheet for Patients:  EntrepreneurPulse.com.au  Fact Sheet for Healthcare Providers:  IncredibleEmployment.be  This test is no t yet approved or cleared by the [place] FDA and  has been authorized for detection and/or diagnosis of SARS-CoV-2 by FDA under an Emergency Use Authorization (EUA). This EUA will remain  in effect (meaning this test can be used) for the duration of the COVID-19 declaration under Section 564(b)(1) of the Act, 21 U.S.C.section 360bbb-3(b)(1), unless the authorization is terminated  or revoked sooner.       Influenza A by PCR NEGATIVE NEGATIVE Final   Influenza B by PCR NEGATIVE NEGATIVE Final    Comment: (NOTE) The Xpert Xpress SARS-CoV-2/FLU/RSV plus assay is intended as an aid in the  diagnosis of influenza from Nasopharyngeal swab specimens and should not be used as a sole basis for treatment. Nasal washings and aspirates are unacceptable for Xpert Xpress SARS-CoV-2/FLU/RSV testing.  Fact Sheet for Patients: EntrepreneurPulse.com.au  Fact Sheet for Healthcare Providers: IncredibleEmployment.be  This test is not yet approved or cleared by the [place] FDA and has been authorized for detection and/or diagnosis of SARS-CoV-2 by FDA under an Emergency Use Authorization (EUA). This EUA will remain in effect (meaning this test can be used) for the duration of the COVID-19 declaration under Section 564(b)(1) of the Act, 21 U.S.C. section 360bbb-3(b)(1), unless the authorization is terminated or revoked.  Performed at [facility], [address] [place], [place] [address]       Radiology Studies: MR PELVIS W WO CONTRAST  Result Date: 03/17/2022 CLINICAL DATA:  Evaluate pelvic mass. EXAM: MRI PELVIS WITHOUT AND WITH CONTRAST TECHNIQUE: Multiplanar multisequence MR imaging of the pelvis was performed both before and after administration of intravenous contrast. CONTRAST:  61m GADAVIST GADOBUTROL 1 MMOL/ML IV SOLN COMPARISON:  CT AP 03/16/2022 FINDINGS: Urinary Tract:  No abnormality visualized. Bowel:  No dilated loops of large or small bowel identified. Vascular/Lymphatic: No significant vascular abnormality. No enlarged abdominopelvic lymph nodes. Reproductive: The uterus is surgically absent. Within the right adnexa there is a large solid and cystic mass which measures, image 17/2 and image 21/29. The more anterior  and solid component measures 9.5 by 6.3 by 8.0 cm, image 22/29. This shows internal enhancement on the postcontrast images. More posteriorly there is a cystic component which measures 3.7 x 3.0 by 3.7 cm. No normal right ovary identified. No left adnexal mass identified. The left ovary is not  visualized. Other: There is a large volume of ascites within the abdomen and pelvis. Peritoneal nodularity is better seen on the CT of the abdomen pelvis performed on the previous day. Musculoskeletal: No suspicious bone lesions identified. IMPRESSION: 1. There is a large solid and cystic mass within the right adnexa which measures up to 9.5 cm in maximum dimension. Findings are concerning for a primary ovarian neoplasm. 2. Large volume of ascites within the abdomen and pelvis. Given the presence of peritoneal nodules on the CT from the prior day findings are concerning for malignant ascites. Electronically Signed   By: Kerby Moors M.D.   On: 03/17/2022 11:57   US Transvaginal Non-OB  Result Date: 03/17/2022 CLINICAL DATA:  Pelvic mass. EXAM: TRANSABDOMINAL AND TRANSVAGINAL ULTRASOUND OF PELVIS TECHNIQUE: Both transabdominal and transvaginal ultrasound examinations of the pelvis were performed. Transabdominal technique was performed for global imaging of the pelvis including uterus, ovaries, adnexal regions, and pelvic cul-de-sac. It was necessary to proceed with endovaginal exam following the transabdominal exam to visualize the ovaries. COMPARISON:  03/16/2022. FINDINGS: Uterus Surgically absent. Right ovary There is a masslike region in the right adnexa extending into the midline measuring 9.2 x 8.7 x 8.7 cm with internal vascularity and may represent the right ovary. A 5.7 x 5.3 x 6.1 cm heterogenous hypoechoic region is noted within the mass. Left ovary Not visualized on exam. Other findings Moderate amount of free fluid in the pelvis, compatible with known ascites. IMPRESSION: 1. Large mass in the right adnexa extending in the midline with heterogeneous attenuation and internal vascularity measuring 9.2 x 8.7 x 8.7 cm. Findings may represent an ovarian mass versus other etiology. MRI with and without contrast is recommended for further characterization and OBGYN consultation is recommended. 2. Left  ovary is not visualized on exam. 3. Uterus is surgically absent. 4. Moderate amount of free fluid in the pelvis, compatible with known ascites. Electronically Signed   By: Brett Fairy M.D.   On: 03/17/2022 00:19   US Pelvis Complete  Result Date: 03/17/2022 CLINICAL DATA:  Pelvic mass. EXAM: TRANSABDOMINAL AND TRANSVAGINAL ULTRASOUND OF PELVIS TECHNIQUE: Both transabdominal and transvaginal ultrasound examinations of the pelvis were performed. Transabdominal technique was performed for global imaging of the pelvis including uterus, ovaries, adnexal regions, and pelvic cul-de-sac. It was necessary to proceed with endovaginal exam following the transabdominal exam to visualize the ovaries. COMPARISON:  03/16/2022. FINDINGS: Uterus Surgically absent. Right ovary There is a masslike region in the right adnexa extending into the midline measuring 9.2 x 8.7 x 8.7 cm with internal vascularity and may represent the right ovary. A 5.7 x 5.3 x 6.1 cm heterogeneous hypoechoic region is noted within the mass. Left ovary Not visualized on exam. Pulsed Doppler evaluation of the right ovary demonstrates normal low-resistance arterial and venous waveforms. Other findings Moderate amount of free fluid in the pelvis, likely related to known ascites. IMPRESSION: 1. Large mass in the right adnexa extending into the midline with heterogeneous attenuation and internal vascularity measuring 9.2 x 8.7 x 8.7 cm. Findings may represent ovarian mass versus other etiology. MRI with and without contrast is recommended for further characterization and OBGYN consultation is recommended. 2. Left ovary is not  visualized on exam. 3. Uterus is surgically absent. 4. Moderate free fluid in the pelvis, compatible with known ascites. Electronically Signed   By: Brett Fairy M.D.   On: 03/17/2022 00:10   CT ABDOMEN PELVIS WO CONTRAST  Result Date: 03/16/2022 CLINICAL DATA:  Abdominal pain, shortness of breath, abdominal bloating and distention,  weight gain * Tracking Code: BO * EXAM: CT ABDOMEN AND PELVIS WITHOUT CONTRAST TECHNIQUE: Multidetector CT imaging of the abdomen and pelvis was performed following the standard protocol without IV contrast. RADIATION DOSE REDUCTION: This exam was performed according to the departmental dose-optimization program which includes automated exposure control, adjustment of the mA and/or kV according to patient size and/or use of iterative reconstruction technique. COMPARISON:  None Available. FINDINGS: Lower chest: No acute abnormality. Coronary artery calcifications. Scarring and or atelectasis of the lung bases. Hepatobiliary: Coarse shrunken cirrhotic morphology of the liver. No obvious solid liver abnormality is seen. No gallstones, gallbladder wall thickening, or biliary dilatation. Pancreas: Unremarkable. No pancreatic ductal dilatation or surrounding inflammatory changes. Spleen: Normal in size without significant abnormality. Adrenals/Urinary Tract: Adrenal glands are unremarkable. Severely atrophic left kidney. Simple, benign left renal cortical cysts, for which no further follow-up or characterization is required. The right kidney is normal in noncontrast appearance, without renal calculi, obvious solid lesion, or hydronephrosis. Bladder is unremarkable. Stomach/Bowel: Stomach is within normal limits. Large diverticulum of the descending duodenum. Appendix is not clearly visualized and may be surgically absent. No evidence of bowel wall thickening, distention, or inflammatory changes. Pancolonic diverticulosis. Vascular/Lymphatic: Aortic atherosclerosis. No enlarged abdominal or pelvic lymph nodes. Reproductive: Status post hysterectomy. Large, mixed solid and cystic mass in the low pelvis, difficult to clearly delineate from adjacent ascites although at least 10.2 x 10.0 cm (series 2, image 75). Other: No abdominal wall hernia or abnormality. Large volume ascites throughout the abdomen and pelvis. Stranding  and nodularity of the omentum and peritoneum, particularly in the ventral abdomen and left upper quadrant (series 2, image 25). Musculoskeletal: No acute or significant osseous findings. IMPRESSION: 1. Large, mixed solid and cystic mass in the low pelvis, difficult to clearly delineate from adjacent ascites although at least 10.2 x 10.0 cm. This is highly concerning for primary ovarian malignancy and most likely arises from the right ovary, although may involve both ovaries. This may be further evaluated by contrast enhanced MRI of the pelvis if desired. 2. Large volume ascites throughout the abdomen and pelvis, presumed malignant. Stranding and nodularity of the omentum and peritoneum, particularly in the ventral abdomen and left upper quadrant, highly suspicious for peritoneal metastatic disease. 3. Cirrhotic morphology of the liver, which may contribute to ascites. 4. Severely atrophic left kidney, consistent with remote prior infectious, obstructive, or ischemic insult. 5. Coronary artery disease. Aortic Atherosclerosis (ICD10-I70.0). Electronically Signed   By: Delanna Ahmadi M.D.   On: 03/16/2022 15:59   DG Chest 2 View  Result Date: 03/16/2022 CLINICAL DATA:  Shortness of breath EXAM: CHEST - 2 VIEW COMPARISON:  None Available. FINDINGS: Cardiac size is within normal limits. There are no signs of pulmonary edema or focal pulmonary consolidation. Linear densities in left lower lung fields may suggest scarring or subsegmental atelectasis. Left hemidiaphragm is elevated. There is no pleural effusion or pneumothorax. IMPRESSION: There are no signs of pulmonary edema or focal pulmonary consolidation. Small linear densities in left lower lung fields suggest scarring or subsegmental atelectasis. Electronically Signed   By: Elmer Picker M.D.   On: 03/16/2022 15:19   DG Abd 1  View  Result Date: 03/16/2022 CLINICAL DATA:  Abdominal distention EXAM: ABDOMEN - 1 VIEW COMPARISON:  None Available. FINDINGS:  Bowel gas pattern is nonspecific. No abnormal masses or calcifications are seen. Kidneys are partly obscured by bowel contents. Vascular calcifications are seen. Marked degenerative changes are noted in lumbar spine. IMPRESSION: Nonspecific bowel gas pattern.  Lumbar spondylosis. Electronically Signed   By: Elmer Picker M.D.   On: 03/16/2022 15:12      LOS: 0 days    Cordelia Poche, MD [facility] 03/17/2022, 2:05 PM   If 7PM-7AM, please contact night-coverage [email or web address]

## 2022-03-17 NOTE — Procedures (Signed)
PROCEDURE SUMMARY:  Successful US guided diagnostic and therapeutic paracentesis from RLQ.  Yielded 3.1 L of clear, yellow fluid.  No immediate complications.  Pt tolerated well.   Specimen was sent for labs.  EBL < 1 mL  Tyson Alias, AGNP 03/17/2022 2:29 PM

## 2022-03-17 NOTE — Telephone Encounter (Signed)
Pt called to let you know about the pelvic mass- states they pulled 3.2 liters of liquid of stomach. I let her know you was following her hospital report and to let us know if she needs anything from [place]

## 2022-03-17 NOTE — Progress Notes (Signed)
Mobility Specialist - Progress Note   03/17/22 0929  Mobility  Activity Ambulated with assistance in hallway  Level of Assistance Standby assist, set-up cues, supervision of patient - no hands on  Assistive Device None  Distance Ambulated (ft) 500 ft  Activity Response Tolerated well  Mobility Referral Yes  $Mobility charge 1 Mobility   Pt received in bed and agreed to mobility, no c/o pain nor discomfort, felt winded nearing EOS. Pt returned to bed with all needs met.   Roderick Pee Mobility Specialist

## 2022-03-18 ENCOUNTER — Other Ambulatory Visit: Payer: Self-pay | Admitting: Hematology and Oncology

## 2022-03-18 ENCOUNTER — Encounter (HOSPITAL_COMMUNITY): Payer: Self-pay | Admitting: Internal Medicine

## 2022-03-18 DIAGNOSIS — R188 Other ascites: Secondary | ICD-10-CM | POA: Diagnosis not present

## 2022-03-18 DIAGNOSIS — R1900 Intra-abdominal and pelvic swelling, mass and lump, unspecified site: Secondary | ICD-10-CM

## 2022-03-18 DIAGNOSIS — E871 Hypo-osmolality and hyponatremia: Secondary | ICD-10-CM | POA: Diagnosis not present

## 2022-03-18 DIAGNOSIS — C800 Disseminated malignant neoplasm, unspecified: Secondary | ICD-10-CM

## 2022-03-18 DIAGNOSIS — N838 Other noninflammatory disorders of ovary, fallopian tube and broad ligament: Secondary | ICD-10-CM | POA: Diagnosis not present

## 2022-03-18 DIAGNOSIS — N179 Acute kidney failure, unspecified: Secondary | ICD-10-CM | POA: Diagnosis not present

## 2022-03-18 LAB — BASIC METABOLIC PANEL
Anion gap: 5 (ref 5–15)
BUN: 21 mg/dL (ref 8–23)
CO2: 19 mmol/L — ABNORMAL LOW (ref 22–32)
Calcium: 7.7 mg/dL — ABNORMAL LOW (ref 8.9–10.3)
Chloride: 114 mmol/L — ABNORMAL HIGH (ref 98–111)
Creatinine, Ser: 1.31 mg/dL — ABNORMAL HIGH (ref 0.44–1.00)
GFR, Estimated: 42 mL/min — ABNORMAL LOW (ref >60)
Glucose, Bld: 88 mg/dL (ref 70–99)
Potassium: 4.2 mmol/L (ref 3.5–5.1)
Sodium: 138 mmol/L (ref 135–145)

## 2022-03-18 LAB — CA 125: Cancer Antigen (CA) 125: 127 U/mL — ABNORMAL HIGH (ref 0.0–38.1)

## 2022-03-18 LAB — CYTOLOGY - NON PAP

## 2022-03-18 LAB — TRIGLYCERIDES, BODY FLUIDS: Triglycerides, Fluid: 24 mg/dL

## 2022-03-18 NOTE — Consult Note (Signed)
[facility] CONSULT NOTE  Patient Care Team: [name], DO as PCP - General (Family Medicine) Vania Rea, MD as Attending Physician (Obstetrics and Gynecology) Linus Mako, MD as Consulting Physician (Family Medicine) Celestia Khat, OD (Optometry)  ASSESSMENT & PLAN:  Diffuse abdominal carcinomatosis, suspect ovarian cancer Her imaging findings and presentation is highly suspicious for undiagnosed ovarian cancer We discussed neoadjuvant chemotherapy approach Cytology from paracentesis is still pending I recommend outpatient follow-up next week to review test results Assuming that cytology confirmed diagnosis of ovarian cancer, I recommend 3 cycles of neoadjuvant chemotherapy with carboplatin and paclitaxel followed by repeat imaging study and possible surgery after that I recommend port placement If we are not able to get port placed while hospitalized, we will arrange that to be done as an outpatient  Recent ascites This is malignant Her abdomen appears soft but there is still palpable fluid wave However, she felt better and I do not believe she needs repeat paracentesis now I will reassess next week  Acute on chronic renal failure CT imaging showed shrunken left kidney With aggressive IV fluid support, her renal function has improved We will monitor closely She can benefit from aggressive fluid resuscitation  Recent weight loss Likely due to her disease We discussed importance of frequent small meals  Remote history of right breast cancer Her recent biopsy on the contralateral breast came back unremarkable for malignancy  Recent nausea, bloating and constipation Resolving with therapeutic paracentesis She will attempt to eat and drink normal today.  Recommend laxatives on a regular basis  Discharge planning She is scheduled to see me next week I am hopeful we can arrange for port placement while hospitalized If not, we will get that  scheduled in the outpatient clinic I am hopeful we can get her started on chemotherapy next week  The total time spent in the appointment was 80 minutes encounter with patients including review of chart and various tests results, discussions about plan of care and coordination of care plan   All questions were answered. The patient knows to call the clinic with any problems, questions or concerns. No barriers to learning was detected.  [name], MD 11/9/202312:49 PM  CHIEF COMPLAINTS/PURPOSE OF CONSULTATION:  Diffuse abdominal carcinomatosis, pelvic mass, malignant ascites, suspect ovarian cancer versus primary carcinomatosis  HISTORY OF PRESENTING ILLNESS:  April Weber 76 y.o. female is seen at the request by GYN oncologist for evaluation of this patient with suspected history of newly diagnosed, locally advanced ovarian cancer  Her daughter is by the bedside The patient have remote history of breast cancer 40 years ago status post radical mastectomy Around June of this year, she underwent imaging study, evaluation and biopsy of the left breast without diagnosis of left breast cancer She started to have progressive abdominal bloating, nausea and changes in bowel habits for the past few months She went to the emergency department 2 days ago.  CT imaging showed large mass in her pelvis associated with ascites.  The patient thought that her recent changes in bowel habits was related to constipation.  She underwent paracentesis with relief of her symptoms.  She is attempting to eat.  Renal function has improved with aggressive fluid resuscitation.   MEDICAL HISTORY:  Past Medical History:  Diagnosis Date   Breast cancer ([place]) 1980   RIGHT   Chronic kidney disease    has one kidney   DVT (deep venous thrombosis) ([facility])    on RIGHT leg-hx of  Hyperlipidemia    on meds   Hypertension    on meds   Osteopenia    PONV (postoperative nausea and vomiting)    Seasonal allergies     Thyroid disease    on meds    SURGICAL HISTORY: Past Surgical History:  Procedure Laterality Date   ABDOMINAL HYSTERECTOMY     APPENDECTOMY     BREAST BIOPSY     BREAST LUMPECTOMY WITH RADIOACTIVE SEED LOCALIZATION Left 12/01/2021   Procedure: LEFT BREAST RADIOACTIVE SEED GUIDED LUMPECTOMY;  Surgeon: Coralie Keens, MD;  Location: [facility];  Service: General;  Laterality: Left;  LMA   COLONOSCOPY  2011   Dr.Kaplan-normal   EYE SURGERY     76 years old   MASTECTOMY Right 1984   [facility]     THYROID SURGERY Bilateral 1967   VARICOSE VEIN SURGERY     ablation   WISDOM TOOTH EXTRACTION      SOCIAL HISTORY: Social History   Socioeconomic History   Marital status: Single    Spouse name: Divorced    Number of children: 1   Years of education: Not on file   Highest education level: Not on file  Occupational History   Occupation: Retired    Comment: worked at [employer]  Tobacco Use   Smoking status: Former    Packs/day: 0.25    Types: Cigarettes    Quit date: 05/11/1967    Years since quitting: 54.8   Smokeless tobacco: Never  Vaping Use   Vaping Use: Never used  Substance and Sexual Activity   Alcohol use: No   Drug use: No   Sexual activity: Not Currently    Birth control/protection: Post-menopausal, Surgical    Comment: hyst  Other Topics Concern   Not on file  Social History Narrative   Lives alone - very close with her neighbors   Social Determinants of Health   Financial Resource Strain: Low Risk  (08/26/2021)   Overall Financial Resource Strain (CARDIA)    Difficulty of Paying Living Expenses: Not hard at all  Food Insecurity: No Food Insecurity (03/17/2022)   Hunger Vital Sign    Worried About Running Out of Food in the Last Year: Never true    [place] in the Last Year: Never true  Transportation Needs: No Transportation Needs (03/17/2022)   PRAPARE - Civil engineer, contracting (Medical): No    Lack of Transportation (Non-Medical): No  Physical Activity: Sufficiently Active (08/26/2021)   Exercise Vital Sign    Days of Exercise per Week: 7 days    Minutes of Exercise per Session: 30 min  Stress: No Stress Concern Present (08/26/2021)       Feeling of Stress : Not at all  Social Connections: Moderately Integrated (08/26/2021)   Social Connection and Isolation Panel [NHANES]    Frequency of Communication with Friends and Family: More than three times a week    Frequency of Social Gatherings with Friends and Family: More than three times a week    Attends Religious Services: More than 4 times per year    Active Member of [employer] or Organizations: Yes    Attends Archivist Meetings: More than 4 times per year    Marital Status: Divorced  Intimate Partner Violence: Not At Risk (03/17/2022)   Humiliation, Afraid, Rape, and Kick questionnaire    Fear of  Current or Ex-Partner: No    Emotionally Abused: No    Physically Abused: No    Sexually Abused: No    FAMILY HISTORY: Family History  Problem Relation Age of Onset   Heart attack Mother    Alcohol abuse Mother    Brain cancer Mother    Breast cancer Maternal Aunt    Colon polyps Neg Hx    Colon cancer Neg Hx    Esophageal cancer Neg Hx    Stomach cancer Neg Hx    Rectal cancer Neg Hx     ALLERGIES:  is allergic to codeine, nsaids, penicillins, vibra-tab [doxycycline], iodine, and latex.  MEDICATIONS:  Current Facility-Administered Medications  Medication Dose Route Frequency Provider Last Rate Last Admin   acetaminophen (TYLENOL) tablet 650 mg  650 mg Oral Q6H PRN Opyd, Ilene Qua, MD       Or   acetaminophen (TYLENOL) suppository 650 mg  650 mg Rectal Q6H PRN Opyd, Ilene Qua, MD       amLODipine (NORVASC) tablet 5 mg  5 mg Oral Daily Opyd, Ilene Qua, MD   5 mg at 03/18/22 0958   atenolol (TENORMIN)  tablet 12.5 mg  12.5 mg Oral BID Mariel Aloe, MD   12.5 mg at 03/18/22 0957   fentaNYL (SUBLIMAZE) injection 12.5-50 mcg  12.5-50 mcg Intravenous Q2H PRN Vianne Bulls, MD   50 mcg at 03/17/22 1225   levothyroxine (SYNTHROID) tablet 100 mcg  100 mcg Oral Q0600 Vianne Bulls, MD   100 mcg at 03/18/22 0609   ondansetron (ZOFRAN) tablet 4 mg  4 mg Oral Q6H PRN Opyd, Ilene Qua, MD       Or   ondansetron (ZOFRAN) injection 4 mg  4 mg Intravenous Q6H PRN Opyd, Ilene Qua, MD       Oral care mouth rinse  15 mL Mouth Rinse PRN Mariel Aloe, MD       pantoprazole (PROTONIX) EC tablet 40 mg  40 mg Oral Daily Opyd, Ilene Qua, MD       simvastatin (ZOCOR) tablet 20 mg  20 mg Oral QHS Opyd, Ilene Qua, MD   20 mg at 03/17/22 2139   sodium chloride flush (NS) 0.9 % injection 3 mL  3 mL Intravenous Q12H Opyd, Ilene Qua, MD   3 mL at 03/18/22 1000    REVIEW OF SYSTEMS:   Constitutional: Denies fevers, chills or abnormal night sweats Eyes: Denies blurriness of vision, double vision or watery eyes Ears, nose, mouth, throat, and face: Denies mucositis or sore throat Respiratory: Denies cough, dyspnea or wheezes Cardiovascular: Denies palpitation, chest discomfort or lower extremity swelling Skin: Denies abnormal skin rashes Lymphatics: Denies new lymphadenopathy or easy bruising Neurological:Denies numbness, tingling or new weaknesses Behavioral/Psych: Mood is stable, no new changes  All other systems were reviewed with the patient and are negative.  PHYSICAL EXAMINATION: ECOG PERFORMANCE STATUS: 1 - Symptomatic but completely ambulatory  Vitals:   03/17/22 2136 03/18/22 0609  BP: 123/62 123/76  Pulse: 97 88  Resp: 16 16  Temp: 98.4 F (36.9 C) 99.2 F (37.3 C)  SpO2: 90% 91%   Filed Weights   03/16/22 2230 03/17/22 0610 03/18/22 0609  Weight: 188 lb 15 oz (85.7 kg) 185 lb (83.9 kg) 185 lb 10 oz (84.2 kg)    GENERAL:alert, no distress and comfortable SKIN: skin color, texture, turgor  are normal, no rashes or significant lesions EYES: normal, conjunctiva are pink and non-injected, sclera clear OROPHARYNX:no exudate, no  erythema and lips, buccal mucosa, and tongue normal  NECK: supple, thyroid normal size, non-tender, without nodularity LYMPH:  no palpable lymphadenopathy in the cervical, axillary or inguinal LUNGS: clear to auscultation and percussion with normal breathing effort HEART: regular rate & rhythm and no murmurs and no lower extremity edema ABDOMEN:abdomen soft, non-tender and normal bowel sounds.  Persistent fluid wave noted Musculoskeletal:no cyanosis of digits and no clubbing  PSYCH: alert & oriented x 3 with fluent speech NEURO: no focal motor/sensory deficits  LABORATORY DATA:  I have reviewed the data as listed Lab Results  Component Value Date   WBC 14.5 (H) 03/16/2022   HGB 15.9 (H) 03/16/2022   HCT 45.7 03/16/2022   MCV 88.1 03/16/2022   PLT 364 03/16/2022   Recent Labs    05/13/21 0813 11/05/21 0810 03/16/22 1417 03/17/22 0641 03/18/22 0501  NA 136 135 129* 133* 138  K 5.1 4.6 4.3 4.0 4.2  CL 101 100 99 109 114*  CO2 24 24 18* 17* 19*  GLUCOSE 91 91 109* 95 88  BUN 16 11 24* 23 21  CREATININE 1.25* 1.07* 1.80* 1.62* 1.31*  CALCIUM 9.7 9.6 9.3 7.6* 7.7*  GFRNONAA  --   --  29* 33* 42*  PROT 6.8  --  6.5  --   --   ALBUMIN 4.3 4.3 3.8  --   --   AST 24  --  22  --   --   ALT 18  --  12  --   --   ALKPHOS 78  --  62  --   --   BILITOT 0.5  --  0.8  --   --     RADIOGRAPHIC STUDIES: I have reviewed her imaging studies I have personally reviewed the radiological images as listed and agreed with the findings in the report. US Paracentesis  Result Date: 03/17/2022 INDICATION: Patient presented with complaint of shortness of breath, loss of appetite and sudden weight gain. Patient admitted for AKI, hyponatremia. Imaging found patient to have large right ovarian mass and cirrhosis of liver with ascites. Request received for diagnostic  and therapeutic paracentesis. EXAM: ULTRASOUND GUIDED DIAGNOSTIC AND THERAPEUTIC RIGHT LOWER QUADRANT PARACENTESIS MEDICATIONS: 10 mL 1 % lidocaine COMPLICATIONS: None immediate. PROCEDURE: Informed written consent was obtained from the patient after a discussion of the risks, benefits and alternatives to treatment. A timeout was performed prior to the initiation of the procedure. Initial ultrasound scanning demonstrates a moderate amount of ascites within the right lower abdominal quadrant. The right lower abdomen was prepped and draped in the usual sterile fashion. 1% lidocaine was used for local anesthesia. Following this, a 19 gauge, 7-cm, Yueh catheter was introduced. An ultrasound image was saved for documentation purposes. The paracentesis was performed. The catheter was removed and a dressing was applied. The patient tolerated the procedure well without immediate post procedural complication. FINDINGS: A total of approximately 3.1 L of clear, yellow fluid was removed. Samples were sent to the laboratory as requested by the clinical team. IMPRESSION: Successful ultrasound-guided paracentesis yielding 3.1 liters of peritoneal fluid. Read by: Narda Rutherford, AGNP-BC Electronically Signed   By: Ruthann Cancer M.D.   On: 03/17/2022 14:39   MR PELVIS W WO CONTRAST  Result Date: 03/17/2022 CLINICAL DATA:  Evaluate pelvic mass. EXAM: MRI PELVIS WITHOUT AND WITH CONTRAST TECHNIQUE: Multiplanar multisequence MR imaging of the pelvis was performed both before and after administration of intravenous contrast. CONTRAST:  63m GADAVIST GADOBUTROL 1 MMOL/ML IV SOLN COMPARISON:  CT AP 03/16/2022 FINDINGS: Urinary Tract:  No abnormality visualized. Bowel:  No dilated loops of large or small bowel identified. Vascular/Lymphatic: No significant vascular abnormality. No enlarged abdominopelvic lymph nodes. Reproductive: The uterus is surgically absent. Within the right adnexa there is a large solid and cystic mass which  measures, image 17/2 and image 21/29. The more anterior and solid component measures 9.5 by 6.3 by 8.0 cm, image 22/29. This shows internal enhancement on the postcontrast images. More posteriorly there is a cystic component which measures 3.7 x 3.0 by 3.7 cm. No normal right ovary identified. No left adnexal mass identified. The left ovary is not visualized. Other: There is a large volume of ascites within the abdomen and pelvis. Peritoneal nodularity is better seen on the CT of the abdomen pelvis performed on the previous day. Musculoskeletal: No suspicious bone lesions identified. IMPRESSION: 1. There is a large solid and cystic mass within the right adnexa which measures up to 9.5 cm in maximum dimension. Findings are concerning for a primary ovarian neoplasm. 2. Large volume of ascites within the abdomen and pelvis. Given the presence of peritoneal nodules on the CT from the prior day findings are concerning for malignant ascites. Electronically Signed   By: Kerby Moors M.D.   On: 03/17/2022 11:57   US Transvaginal Non-OB  Result Date: 03/17/2022 CLINICAL DATA:  Pelvic mass. EXAM: TRANSABDOMINAL AND TRANSVAGINAL ULTRASOUND OF PELVIS TECHNIQUE: Both transabdominal and transvaginal ultrasound examinations of the pelvis were performed. Transabdominal technique was performed for global imaging of the pelvis including uterus, ovaries, adnexal regions, and pelvic cul-de-sac. It was necessary to proceed with endovaginal exam following the transabdominal exam to visualize the ovaries. COMPARISON:  03/16/2022. FINDINGS: Uterus Surgically absent. Right ovary There is a masslike region in the right adnexa extending into the midline measuring 9.2 x 8.7 x 8.7 cm with internal vascularity and may represent the right ovary. A 5.7 x 5.3 x 6.1 cm heterogenous hypoechoic region is noted within the mass. Left ovary Not visualized on exam. Other findings Moderate amount of free fluid in the pelvis, compatible with known  ascites. IMPRESSION: 1. Large mass in the right adnexa extending in the midline with heterogeneous attenuation and internal vascularity measuring 9.2 x 8.7 x 8.7 cm. Findings may represent an ovarian mass versus other etiology. MRI with and without contrast is recommended for further characterization and OBGYN consultation is recommended. 2. Left ovary is not visualized on exam. 3. Uterus is surgically absent. 4. Moderate amount of free fluid in the pelvis, compatible with known ascites. Electronically Signed   By: Brett Fairy M.D.   On: 03/17/2022 00:19   US Pelvis Complete  Result Date: 03/17/2022 CLINICAL DATA:  Pelvic mass. EXAM: TRANSABDOMINAL AND TRANSVAGINAL ULTRASOUND OF PELVIS TECHNIQUE: Both transabdominal and transvaginal ultrasound examinations of the pelvis were performed. Transabdominal technique was performed for global imaging of the pelvis including uterus, ovaries, adnexal regions, and pelvic cul-de-sac. It was necessary to proceed with endovaginal exam following the transabdominal exam to visualize the ovaries. COMPARISON:  03/16/2022. FINDINGS: Uterus Surgically absent. Right ovary There is a masslike region in the right adnexa extending into the midline measuring 9.2 x 8.7 x 8.7 cm with internal vascularity and may represent the right ovary. A 5.7 x 5.3 x 6.1 cm heterogeneous hypoechoic region is noted within the mass. Left ovary Not visualized on exam. Pulsed Doppler evaluation of the right ovary demonstrates normal low-resistance arterial and venous waveforms. Other findings Moderate amount of free fluid in the  pelvis, likely related to known ascites. IMPRESSION: 1. Large mass in the right adnexa extending into the midline with heterogeneous attenuation and internal vascularity measuring 9.2 x 8.7 x 8.7 cm. Findings may represent ovarian mass versus other etiology. MRI with and without contrast is recommended for further characterization and OBGYN consultation is recommended. 2. Left  ovary is not visualized on exam. 3. Uterus is surgically absent. 4. Moderate free fluid in the pelvis, compatible with known ascites. Electronically Signed   By: Brett Fairy M.D.   On: 03/17/2022 00:10   CT ABDOMEN PELVIS WO CONTRAST  Result Date: 03/16/2022 CLINICAL DATA:  Abdominal pain, shortness of breath, abdominal bloating and distention, weight gain * Tracking Code: BO * EXAM: CT ABDOMEN AND PELVIS WITHOUT CONTRAST TECHNIQUE: Multidetector CT imaging of the abdomen and pelvis was performed following the standard protocol without IV contrast. RADIATION DOSE REDUCTION: This exam was performed according to the departmental dose-optimization program which includes automated exposure control, adjustment of the mA and/or kV according to patient size and/or use of iterative reconstruction technique. COMPARISON:  None Available. FINDINGS: Lower chest: No acute abnormality. Coronary artery calcifications. Scarring and or atelectasis of the lung bases. Hepatobiliary: Coarse shrunken cirrhotic morphology of the liver. No obvious solid liver abnormality is seen. No gallstones, gallbladder wall thickening, or biliary dilatation. Pancreas: Unremarkable. No pancreatic ductal dilatation or surrounding inflammatory changes. Spleen: Normal in size without significant abnormality. Adrenals/Urinary Tract: Adrenal glands are unremarkable. Severely atrophic left kidney. Simple, benign left renal cortical cysts, for which no further follow-up or characterization is required. The right kidney is normal in noncontrast appearance, without renal calculi, obvious solid lesion, or hydronephrosis. Bladder is unremarkable. Stomach/Bowel: Stomach is within normal limits. Large diverticulum of the descending duodenum. Appendix is not clearly visualized and may be surgically absent. No evidence of bowel wall thickening, distention, or inflammatory changes. Pancolonic diverticulosis. Vascular/Lymphatic: Aortic atherosclerosis. No  enlarged abdominal or pelvic lymph nodes. Reproductive: Status post hysterectomy. Large, mixed solid and cystic mass in the low pelvis, difficult to clearly delineate from adjacent ascites although at least 10.2 x 10.0 cm (series 2, image 75). Other: No abdominal wall hernia or abnormality. Large volume ascites throughout the abdomen and pelvis. Stranding and nodularity of the omentum and peritoneum, particularly in the ventral abdomen and left upper quadrant (series 2, image 25). Musculoskeletal: No acute or significant osseous findings. IMPRESSION: 1. Large, mixed solid and cystic mass in the low pelvis, difficult to clearly delineate from adjacent ascites although at least 10.2 x 10.0 cm. This is highly concerning for primary ovarian malignancy and most likely arises from the right ovary, although may involve both ovaries. This may be further evaluated by contrast enhanced MRI of the pelvis if desired. 2. Large volume ascites throughout the abdomen and pelvis, presumed malignant. Stranding and nodularity of the omentum and peritoneum, particularly in the ventral abdomen and left upper quadrant, highly suspicious for peritoneal metastatic disease. 3. Cirrhotic morphology of the liver, which may contribute to ascites. 4. Severely atrophic left kidney, consistent with remote prior infectious, obstructive, or ischemic insult. 5. Coronary artery disease. Aortic Atherosclerosis (ICD10-I70.0). Electronically Signed   By: Delanna Ahmadi M.D.   On: 03/16/2022 15:59   DG Chest 2 View  Result Date: 03/16/2022 CLINICAL DATA:  Shortness of breath EXAM: CHEST - 2 VIEW COMPARISON:  None Available. FINDINGS: Cardiac size is within normal limits. There are no signs of pulmonary edema or focal pulmonary consolidation. Linear densities in left lower lung fields may suggest  scarring or subsegmental atelectasis. Left hemidiaphragm is elevated. There is no pleural effusion or pneumothorax. IMPRESSION: There are no signs of  pulmonary edema or focal pulmonary consolidation. Small linear densities in left lower lung fields suggest scarring or subsegmental atelectasis. Electronically Signed   By: Elmer Picker M.D.   On: 03/16/2022 15:19   DG Abd 1 View  Result Date: 03/16/2022 CLINICAL DATA:  Abdominal distention EXAM: ABDOMEN - 1 VIEW COMPARISON:  None Available. FINDINGS: Bowel gas pattern is nonspecific. No abnormal masses or calcifications are seen. Kidneys are partly obscured by bowel contents. Vascular calcifications are seen. Marked degenerative changes are noted in lumbar spine. IMPRESSION: Nonspecific bowel gas pattern.  Lumbar spondylosis. Electronically Signed   By: Elmer Picker M.D.   On: 03/16/2022 15:12

## 2022-03-18 NOTE — Progress Notes (Signed)
GYO Progress Note  Subjective: Patient reports overall doing well. Was able to eat and drink some last night and this morning. Having some mild nausea now but thinks this is because she is hungry. + flatus this morning, feels like she needs to have a bowel movement.     Objective: Vital signs in last 24 hours: Temp:  [98.2 F (36.8 C)-99.2 F (37.3 C)] 99.2 F (37.3 C) (11/09 0609) Pulse Rate:  [85-97] 88 (11/09 0609) Resp:  [16] 16 (11/09 0609) BP: (123-124)/(62-76) 123/76 (11/09 0609) SpO2:  [90 %-92 %] 91 % (11/09 0609) Weight:  [185 lb 10 oz (84.2 kg)] 185 lb 10 oz (84.2 kg) (11/09 0609)    Intake/Output from previous day: 11/08 0701 - 11/09 0700 In: 960.9 [P.O.:180; I.V.:780.9] Out: 2425 [Urine:2425]  Physical Examination: Gen: NAD, alert and oriented HEENT: Normocephalic, atraumatic Cardiovascular: Regular rate and rhythm, no murmurs or rubs appreciated Pulmonary: Clear to auscultation bilaterally, no wheezes or rhonchi Abdomen: Somewhat distended, mildly tympanitic, fluid wave palpated, no masses palpated, nontender Extremities: Warm and well perfused, no edema, SCDs in place  Labs:    Latest Ref Rng & Units 03/16/2022    2:17 PM 11/05/2021    8:10 AM 11/17/2020    9:13 AM  CBC  WBC 4.0 - 10.5 K/uL 14.5  CANCELED  8.9   Hemoglobin 12.0 - 15.0 g/dL 15.9  CANCELED  14.2   Hematocrit 36.0 - 46.0 % 45.7  CANCELED  42.3   Platelets 150 - 400 K/uL 364  CANCELED  338       Latest Ref Rng & Units 03/18/2022    5:01 AM 03/17/2022    6:41 AM 03/16/2022    2:17 PM  BMP  Glucose 70 - 99 mg/dL 88  95  109   BUN 8 - 23 mg/dL _0 Creatinine 0.44 - 1.00 mg/dL 1.31  1.62  1.80   Sodium 135 - 145 mmol/L 138  133  129   Potassium 3.5 - 5.1 mmol/L 4.2  4.0  4.3   Chloride 98 - 111 mmol/L 114  109  99   CO2 22 - 32 mmol/L _1 Calcium 8.9 - 10.3 mg/dL 7.7  7.6  9.3    CA-125: 127 CEA: 1.24  Assessment:  76 y.o. with metastatic gynecologic malignancy,  likely ovarian.   Malignant ascites, ovarian mass, carcinomatosis: had long discussion with the patient about upfront treatment of presumed ovarian cancer. Cytology from paracentesis is pending. Patient met with Dr. Alvy Bimler this morning with plan for outpatient follow-up next week. She will have port placed either prior to discharge or outpatient next week with plan to start NACT. Will place genetics referral as we discussed yesterday.  Acute on chronic kidney injury: Creatinine continues to downtrend toward her baseline with IV and PO hydration.   Patient was given our cards and we reviewed that she can call our office with any questions or concerns. From my standpoint, she can be discharged when ready (if port placement cannot happen inpatient today or tomorrow).    LOS: 1 day    Lafonda Mosses 03/18/2022, 1:37 PM

## 2022-03-18 NOTE — Progress Notes (Signed)
Mobility Specialist - Progress Note   03/18/22 0856  Mobility  Activity Ambulated independently in hallway  Level of Assistance Independent after set-up  Assistive Device None  Distance Ambulated (ft) 600 ft  Activity Response Tolerated well  Mobility Referral Yes  $Mobility charge 1 Mobility   Pt received in bed and agreed to mobility, no c/o pain nor discomfort during session, pt returned to bed with all needs met and family in room.  Roderick Pee Mobility Specialist

## 2022-03-18 NOTE — Consult Note (Signed)
Chief Complaint: Patient was seen in consultation today for ovarian mass at the request of Alvy Bimler, Ni  Referring Physician(s): [name]  Supervising Physician: Mir, Sharen Heck  Patient Status: [facility] - In-pt  History of Present Illness: April Weber is a 76 y.o. female with PMH significant for breast cancer, CKD, a single kidney secondary to complication of surgery with hysterectomy, HTN and HLD.  Patient presented to [facility] ED complaining of abdominal distention, 14 pound weight gain and swelling with associated shortness of breath x2 to 3 weeks.  CTAP at that time showed large, mixed solid and cystic mass in the low pelvis highly concerning for primary ovarian malignancy with large volume ascites.  Patient was admitted for further work-up and oncology consult.  Patient underwent diagnostic paracentesis 03/17/2022 with results still pending.  Dr. [name] consulted with patient 03/18/2022 and advised findings highly suspicious for abdominal carcinomatosis with suspected ovarian cancer.  Patient has been referred to IR for tunneled catheter with port placement to begin chemotherapy treatment.  Procedure tentatively scheduled for 03/19/2022 and IR.  Pt denies fever, chills, CP, weakness, N/V.  She endorses loss of appetite (improved after paracentesis), SOB, abdominal pain and distention.  She understands that she will be NPO after MN for port placement 11/10.   Past Medical History:  Diagnosis Date   Breast cancer ([place]) 1980   RIGHT   Chronic kidney disease    has one kidney   DVT (deep venous thrombosis) (HCC)    on RIGHT leg-hx of   Hyperlipidemia    on meds   Hypertension    on meds   Osteopenia    PONV (postoperative nausea and vomiting)    Seasonal allergies    Thyroid disease    on meds    Past Surgical History:  Procedure Laterality Date   ABDOMINAL HYSTERECTOMY     APPENDECTOMY     BREAST BIOPSY     BREAST LUMPECTOMY WITH RADIOACTIVE SEED LOCALIZATION Left  12/01/2021   Procedure: LEFT BREAST RADIOACTIVE SEED GUIDED LUMPECTOMY;  Surgeon: Coralie Keens, MD;  Location: [place];  Service: General;  Laterality: Left;  LMA   COLONOSCOPY  2011   Dr.Kaplan-normal   EYE SURGERY     76 years old   MASTECTOMY Right [place]     THYROID SURGERY Bilateral [place]     ablation   WISDOM TOOTH EXTRACTION      Allergies: Codeine, Nsaids, Penicillins, Vibra-tab [doxycycline], Iodine, and Latex  Medications: Prior to Admission medications   Medication Sig Start Date End Date Taking? Authorizing Provider  acetaminophen (TYLENOL) 500 MG tablet Take 500-1,000 mg by mouth daily as needed for moderate pain, fever or headache.   Yes [provider]  amLODipine (NORVASC) 5 MG tablet Take 1 tablet (5 mg total) by mouth daily. Patient taking differently: Take 5 mg by mouth in the morning. 11/16/21  Yes Ronnie Doss M, DO  aspirin 81 MG EC tablet Take 81 mg by mouth at bedtime.   Yes [provider]  atenolol (TENORMIN) 25 MG tablet Take 1 tablet (25 mg total) by mouth daily. Patient taking differently: Take 12.5 mg by mouth 2 (two) times daily. 11/16/21  Yes Gottschalk, Leatrice Jewels M, DO  Calcium Carb-Cholecalciferol (CALCIUM + VITAMIN D3 PO) Take 1 tablet by mouth 2 (two) times daily.   Yes [provider]  levothyroxine (SYNTHROID) 100 MCG tablet Take 1/2 tablet on Sundays and  1 tablet daily all other days Patient taking differently: Take 100 mcg by mouth in the morning. 11/16/21  Yes Gottschalk, Ashly M, DO  loratadine (CLARITIN) 10 MG tablet Take 10 mg by mouth at bedtime.   Yes [provider]  losartan (COZAAR) 100 MG tablet Take 1 tablet (100 mg total) by mouth daily. Patient taking differently: Take 100 mg by mouth at bedtime. 11/16/21  Yes Ronnie Doss M, DO  Multiple Vitamin (MULTIVITAMIN) tablet Take 1 tablet by mouth in the morning.   Yes [provider]  simvastatin (ZOCOR) 20 MG tablet Take 1 tablet (20 mg total) by mouth at bedtime. Cancel rx for '10mg'$ . 11/16/21  Yes Gottschalk, Ashly M, DO  alendronate (FOSAMAX) 70 MG tablet Take 1 tablet (70 mg total) by mouth every 7 (seven) days. Take with a full glass of water on an empty stomach. Patient not taking: Reported on 01/22/2022 11/16/21   [name], DO  pantoprazole (PROTONIX) 40 MG tablet Take 1 tablet (40 mg total) by mouth daily. X2-4 weeks Patient not taking: Reported on 03/17/2022 03/09/22   [name], DO     Family History  Problem Relation Age of Onset   Heart attack Mother    Alcohol abuse Mother    Brain cancer Mother    Breast cancer Maternal Aunt    Colon polyps Neg Hx    Colon cancer Neg Hx    Esophageal cancer Neg Hx    Stomach cancer Neg Hx    Rectal cancer Neg Hx     Social History   Socioeconomic History   Marital status: Single    Spouse name: Divorced    Number of children: 1   Years of education: Not on file   Highest education level: Not on file  Occupational History   Occupation: Retired    Comment: worked at [employer] in [employer]  Tobacco Use   Smoking status: Former    Packs/day: 0.25    Types: Cigarettes    Quit date: 05/11/1967    Years since quitting: 54.8   Smokeless tobacco: Never  Vaping Use   Vaping Use: Never used  Substance and Sexual Activity   Alcohol use: No   Drug use: No   Sexual activity: Not Currently    Birth control/protection: Post-menopausal, Surgical    Comment: hyst  Other Topics Concern   Not on file  Social History Narrative   Lives alone - very close with her neighbors   Social Determinants of Health   Financial Resource Strain: Low Risk  (08/26/2021)   Overall Financial Resource Strain (CARDIA)    Difficulty of Paying Living Expenses: Not hard at all  Food Insecurity: No Food Insecurity (03/17/2022)   Hunger Vital Sign    Worried About Running Out of  Food in the Last Year: Never true    [place] in the Last Year: Never true  Transportation Needs: No Transportation Needs (03/17/2022)   PRAPARE - Hydrologist (Medical): No    Lack of Transportation (Non-Medical): No  Physical Activity: Sufficiently Active (08/26/2021)   Exercise Vital Sign    Days of Exercise per Week: 7 days    Minutes of Exercise per Session: 30 min  Stress: No Stress Concern Present (08/26/2021)   [place]    Feeling of Stress : Not at all  Social Connections: Moderately Integrated (08/26/2021)  Social Licensed conveyancer [NHANES]    Frequency of Communication with Friends and Family: More than three times a week    Frequency of Social Gatherings with Friends and Family: More than three times a week    Attends Religious Services: More than 4 times per year    Active Member of [employer] or Organizations: Yes    Attends Music therapist: More than 4 times per year    Marital Status: Divorced     Review of Systems: A 12 point ROS discussed and pertinent positives are indicated in the HPI above.  All other systems are negative.  Review of Systems  Constitutional:  Positive for appetite change, fatigue and unexpected weight change. Negative for chills and fever.  Respiratory:  Positive for shortness of breath.   Cardiovascular:  Negative for chest pain and leg swelling.  Gastrointestinal:  Positive for abdominal distention and abdominal pain. Negative for nausea and vomiting.  Neurological:  Negative for dizziness, weakness and headaches.  Hematological:  Does not bruise/bleed easily.    Vital Signs: BP 112/70 (BP Location: Left Arm)   Pulse 83   Temp 98.5 F (36.9 C) (Oral)   Resp 18   Ht '5\' 6"'$  (1.676 m)   Wt 185 lb 10 oz (84.2 kg)   SpO2 90%   BMI 29.96 kg/m    Physical Exam Vitals reviewed.  Constitutional:      General: She is  not in acute distress.    Appearance: She is well-developed. She is not ill-appearing.  HENT:     Head: Normocephalic and atraumatic.     Mouth/Throat:     Mouth: Mucous membranes are moist.     Pharynx: Oropharynx is clear.  Eyes:     Extraocular Movements: Extraocular movements intact.     Pupils: Pupils are equal, round, and reactive to light.  Cardiovascular:     Rate and Rhythm: Normal rate and regular rhythm.     Pulses: Normal pulses.     Heart sounds: Normal heart sounds. No murmur heard. Pulmonary:     Effort: Pulmonary effort is normal. No respiratory distress.     Breath sounds: Normal breath sounds.  Abdominal:     General: Bowel sounds are normal. There is no distension.     Palpations: Abdomen is soft.     Tenderness: There is no abdominal tenderness. There is no guarding.  Musculoskeletal:     Right lower leg: No edema.     Left lower leg: No edema.  Skin:    General: Skin is warm and dry.  Neurological:     Mental Status: She is alert and oriented to person, place, and time.  Psychiatric:        Mood and Affect: Mood normal.        Behavior: Behavior normal.        Thought Content: Thought content normal.        Judgment: Judgment normal.     Imaging: US Paracentesis  Result Date: 03/17/2022 INDICATION: Patient presented with complaint of shortness of breath, loss of appetite and sudden weight gain. Patient admitted for AKI, hyponatremia. Imaging found patient to have large right ovarian mass and cirrhosis of liver with ascites. Request received for diagnostic and therapeutic paracentesis. EXAM: ULTRASOUND GUIDED DIAGNOSTIC AND THERAPEUTIC RIGHT LOWER QUADRANT PARACENTESIS MEDICATIONS: 10 mL 1 % lidocaine COMPLICATIONS: None immediate. PROCEDURE: Informed written consent was obtained from the patient after a discussion of the risks, benefits and alternatives to treatment.  A timeout was performed prior to the initiation of the procedure. Initial ultrasound  scanning demonstrates a moderate amount of ascites within the right lower abdominal quadrant. The right lower abdomen was prepped and draped in the usual sterile fashion. 1% lidocaine was used for local anesthesia. Following this, a 19 gauge, 7-cm, Yueh catheter was introduced. An ultrasound image was saved for documentation purposes. The paracentesis was performed. The catheter was removed and a dressing was applied. The patient tolerated the procedure well without immediate post procedural complication. FINDINGS: A total of approximately 3.1 L of clear, yellow fluid was removed. Samples were sent to the laboratory as requested by the clinical team. IMPRESSION: Successful ultrasound-guided paracentesis yielding 3.1 liters of peritoneal fluid. Read by: Narda Rutherford, AGNP-BC Electronically Signed   By: Ruthann Cancer M.D.   On: 03/17/2022 14:39   MR PELVIS W WO CONTRAST  Result Date: 03/17/2022 CLINICAL DATA:  Evaluate pelvic mass. EXAM: MRI PELVIS WITHOUT AND WITH CONTRAST TECHNIQUE: Multiplanar multisequence MR imaging of the pelvis was performed both before and after administration of intravenous contrast. CONTRAST:  1m GADAVIST GADOBUTROL 1 MMOL/ML IV SOLN COMPARISON:  CT AP 03/16/2022 FINDINGS: Urinary Tract:  No abnormality visualized. Bowel:  No dilated loops of large or small bowel identified. Vascular/Lymphatic: No significant vascular abnormality. No enlarged abdominopelvic lymph nodes. Reproductive: The uterus is surgically absent. Within the right adnexa there is a large solid and cystic mass which measures, image 17/2 and image 21/29. The more anterior and solid component measures 9.5 by 6.3 by 8.0 cm, image 22/29. This shows internal enhancement on the postcontrast images. More posteriorly there is a cystic component which measures 3.7 x 3.0 by 3.7 cm. No normal right ovary identified. No left adnexal mass identified. The left ovary is not visualized. Other: There is a large volume of ascites within  the abdomen and pelvis. Peritoneal nodularity is better seen on the CT of the abdomen pelvis performed on the previous day. Musculoskeletal: No suspicious bone lesions identified. IMPRESSION: 1. There is a large solid and cystic mass within the right adnexa which measures up to 9.5 cm in maximum dimension. Findings are concerning for a primary ovarian neoplasm. 2. Large volume of ascites within the abdomen and pelvis. Given the presence of peritoneal nodules on the CT from the prior day findings are concerning for malignant ascites. Electronically Signed   By: TKerby MoorsM.D.   On: 03/17/2022 11:57   UKoreaTransvaginal Non-OB  Result Date: 03/17/2022 CLINICAL DATA:  Pelvic mass. EXAM: TRANSABDOMINAL AND TRANSVAGINAL ULTRASOUND OF PELVIS TECHNIQUE: Both transabdominal and transvaginal ultrasound examinations of the pelvis were performed. Transabdominal technique was performed for global imaging of the pelvis including uterus, ovaries, adnexal regions, and pelvic cul-de-sac. It was necessary to proceed with endovaginal exam following the transabdominal exam to visualize the ovaries. COMPARISON:  03/16/2022. FINDINGS: Uterus Surgically absent. Right ovary There is a masslike region in the right adnexa extending into the midline measuring 9.2 x 8.7 x 8.7 cm with internal vascularity and may represent the right ovary. A 5.7 x 5.3 x 6.1 cm heterogenous hypoechoic region is noted within the mass. Left ovary Not visualized on exam. Other findings Moderate amount of free fluid in the pelvis, compatible with known ascites. IMPRESSION: 1. Large mass in the right adnexa extending in the midline with heterogeneous attenuation and internal vascularity measuring 9.2 x 8.7 x 8.7 cm. Findings may represent an ovarian mass versus other etiology. MRI with and without contrast is recommended for further  characterization and OBGYN consultation is recommended. 2. Left ovary is not visualized on exam. 3. Uterus is surgically absent.  4. Moderate amount of free fluid in the pelvis, compatible with known ascites. Electronically Signed   By: Brett Fairy M.D.   On: 03/17/2022 00:19   US Pelvis Complete  Result Date: 03/17/2022 CLINICAL DATA:  Pelvic mass. EXAM: TRANSABDOMINAL AND TRANSVAGINAL ULTRASOUND OF PELVIS TECHNIQUE: Both transabdominal and transvaginal ultrasound examinations of the pelvis were performed. Transabdominal technique was performed for global imaging of the pelvis including uterus, ovaries, adnexal regions, and pelvic cul-de-sac. It was necessary to proceed with endovaginal exam following the transabdominal exam to visualize the ovaries. COMPARISON:  03/16/2022. FINDINGS: Uterus Surgically absent. Right ovary There is a masslike region in the right adnexa extending into the midline measuring 9.2 x 8.7 x 8.7 cm with internal vascularity and may represent the right ovary. A 5.7 x 5.3 x 6.1 cm heterogeneous hypoechoic region is noted within the mass. Left ovary Not visualized on exam. Pulsed Doppler evaluation of the right ovary demonstrates normal low-resistance arterial and venous waveforms. Other findings Moderate amount of free fluid in the pelvis, likely related to known ascites. IMPRESSION: 1. Large mass in the right adnexa extending into the midline with heterogeneous attenuation and internal vascularity measuring 9.2 x 8.7 x 8.7 cm. Findings may represent ovarian mass versus other etiology. MRI with and without contrast is recommended for further characterization and OBGYN consultation is recommended. 2. Left ovary is not visualized on exam. 3. Uterus is surgically absent. 4. Moderate free fluid in the pelvis, compatible with known ascites. Electronically Signed   By: Brett Fairy M.D.   On: 03/17/2022 00:10   CT ABDOMEN PELVIS WO CONTRAST  Result Date: 03/16/2022 CLINICAL DATA:  Abdominal pain, shortness of breath, abdominal bloating and distention, weight gain * Tracking Code: BO * EXAM: CT ABDOMEN AND PELVIS  WITHOUT CONTRAST TECHNIQUE: Multidetector CT imaging of the abdomen and pelvis was performed following the standard protocol without IV contrast. RADIATION DOSE REDUCTION: This exam was performed according to the departmental dose-optimization program which includes automated exposure control, adjustment of the mA and/or kV according to patient size and/or use of iterative reconstruction technique. COMPARISON:  None Available. FINDINGS: Lower chest: No acute abnormality. Coronary artery calcifications. Scarring and or atelectasis of the lung bases. Hepatobiliary: Coarse shrunken cirrhotic morphology of the liver. No obvious solid liver abnormality is seen. No gallstones, gallbladder wall thickening, or biliary dilatation. Pancreas: Unremarkable. No pancreatic ductal dilatation or surrounding inflammatory changes. Spleen: Normal in size without significant abnormality. Adrenals/Urinary Tract: Adrenal glands are unremarkable. Severely atrophic left kidney. Simple, benign left renal cortical cysts, for which no further follow-up or characterization is required. The right kidney is normal in noncontrast appearance, without renal calculi, obvious solid lesion, or hydronephrosis. Bladder is unremarkable. Stomach/Bowel: Stomach is within normal limits. Large diverticulum of the descending duodenum. Appendix is not clearly visualized and may be surgically absent. No evidence of bowel wall thickening, distention, or inflammatory changes. Pancolonic diverticulosis. Vascular/Lymphatic: Aortic atherosclerosis. No enlarged abdominal or pelvic lymph nodes. Reproductive: Status post hysterectomy. Large, mixed solid and cystic mass in the low pelvis, difficult to clearly delineate from adjacent ascites although at least 10.2 x 10.0 cm (series 2, image 75). Other: No abdominal wall hernia or abnormality. Large volume ascites throughout the abdomen and pelvis. Stranding and nodularity of the omentum and peritoneum, particularly in  the ventral abdomen and left upper quadrant (series 2, image 25). Musculoskeletal: No acute or  significant osseous findings. IMPRESSION: 1. Large, mixed solid and cystic mass in the low pelvis, difficult to clearly delineate from adjacent ascites although at least 10.2 x 10.0 cm. This is highly concerning for primary ovarian malignancy and most likely arises from the right ovary, although may involve both ovaries. This may be further evaluated by contrast enhanced MRI of the pelvis if desired. 2. Large volume ascites throughout the abdomen and pelvis, presumed malignant. Stranding and nodularity of the omentum and peritoneum, particularly in the ventral abdomen and left upper quadrant, highly suspicious for peritoneal metastatic disease. 3. Cirrhotic morphology of the liver, which may contribute to ascites. 4. Severely atrophic left kidney, consistent with remote prior infectious, obstructive, or ischemic insult. 5. Coronary artery disease. Aortic Atherosclerosis (ICD10-I70.0). Electronically Signed   By: Delanna Ahmadi M.D.   On: 03/16/2022 15:59   DG Chest 2 View  Result Date: 03/16/2022 CLINICAL DATA:  Shortness of breath EXAM: CHEST - 2 VIEW COMPARISON:  None Available. FINDINGS: Cardiac size is within normal limits. There are no signs of pulmonary edema or focal pulmonary consolidation. Linear densities in left lower lung fields may suggest scarring or subsegmental atelectasis. Left hemidiaphragm is elevated. There is no pleural effusion or pneumothorax. IMPRESSION: There are no signs of pulmonary edema or focal pulmonary consolidation. Small linear densities in left lower lung fields suggest scarring or subsegmental atelectasis. Electronically Signed   By: Elmer Picker M.D.   On: 03/16/2022 15:19   DG Abd 1 View  Result Date: 03/16/2022 CLINICAL DATA:  Abdominal distention EXAM: ABDOMEN - 1 VIEW COMPARISON:  None Available. FINDINGS: Bowel gas pattern is nonspecific. No abnormal masses or  calcifications are seen. Kidneys are partly obscured by bowel contents. Vascular calcifications are seen. Marked degenerative changes are noted in lumbar spine. IMPRESSION: Nonspecific bowel gas pattern.  Lumbar spondylosis. Electronically Signed   By: Elmer Picker M.D.   On: 03/16/2022 15:12    Labs:  CBC: Recent Labs    11/05/21 0810 03/16/22 1417  WBC CANCELED 14.5*  HGB CANCELED 15.9*  HCT CANCELED 45.7  PLT CANCELED 364    COAGS: No results for input(s): "INR", "APTT" in the last 8760 hours.  BMP: Recent Labs    11/05/21 0810 03/16/22 1417 03/17/22 0641 03/18/22 0501  NA 135 129* 133* 138  K 4.6 4.3 4.0 4.2  CL 100 99 109 114*  CO2 24 18* 17* 19*  GLUCOSE 91 109* 95 88  BUN 11 24* 23 21  CALCIUM 9.6 9.3 7.6* 7.7*  CREATININE 1.07* 1.80* 1.62* 1.31*  GFRNONAA  --  29* 33* 42*    LIVER FUNCTION TESTS: Recent Labs    05/13/21 0813 11/05/21 0810 03/16/22 1417  BILITOT 0.5  --  0.8  AST 24  --  22  ALT 18  --  12  ALKPHOS 78  --  62  PROT 6.8  --  6.5  ALBUMIN 4.3 4.3 3.8    TUMOR MARKERS: Recent Labs    03/16/22 1757  CEA 1.24    Assessment and Plan: 76 year old female with diffuse abdominal carcinomatosis and suspected ovarian cancer presents to IR for tunneled catheter with port placement.  Pt resting in bed with her daughter at bedside.  She is A&O, calm and pleasant.  She is in no distress.   Risks and benefits of image guided tunneled catheter with port placement with moderate sedation was discussed with the patient including, but not limited to bleeding, infection, pneumothorax, or fibrin sheath development and  need for additional procedures.  All of the patient's questions were answered, patient is agreeable to proceed. Consent signed and in chart.  Thank you for this interesting consult.  I greatly enjoyed meeting April Weber and look forward to participating in their care.  A copy of this report was sent to the requesting  provider on this date.  Electronically Signed: Tyson Alias, NP 03/18/2022, 3:14 PM   I spent a total of 20 minutes in face to face in clinical consultation, greater than 50% of which was counseling/coordinating care for ovarian mass.

## 2022-03-18 NOTE — Progress Notes (Signed)
PROGRESS NOTE    April Weber  DGU:[id number] DOB: January 08, 1946 DOA: 03/16/2022 PCP: [name], DO   Brief Narrative: April Weber is a 76 y.o. is a female with a history of right breast cancer status post mastectomy, CKD stage IIIa, history of DVT not on anticoagulation, hypertension, hypothyroidism.  Patient presented secondary to abdominal distention and shortness of breath was found to have evidence of abdominal ascites in addition to a large right adnexal mass concerning for ovarian cancer.  Gynecology/oncology consulted and recommended MRI, CEA, CA 125.  MRI confirmed concern for primary right adnexal malignancy.  Paracentesis ordered for therapeutic/diagnostic purpose.   Assessment and Plan:  Right ovarian mass Concern for ovarian malignancy. Gynecology oncology consulted and recommended CEA (normal) and CA-125 (elevated). MRI obtained and is concerning for primary ovarian neoplasm. Plan for port placement and initiation of chemotherapy. Patient to follow-up with medical/gynecology oncology as an outpatient.  Ascites Concerning for malignant in setting of above. Paracentesis performed on 11/8. Gram stain negative. Cytology negative for malignant cells.  Dyspnea Likely secondary to ascites. No hypoxia.  AKI on CKD stage IIIa Baseline creatinine of about 1-1.1. Creatinine of 1.8 on admission. Losartan held. IV fluids given with improvement of creatinine to 31 this morning. -Discontinue IV fluids  Hyponatremia Secondary to poor oral intake. Sodium of 129 on admission and has improved to 138 today with IV fluids. -Discontinue IV fluids.  Primary hypertension -Continue amlodipine and atenolol  Hypothyroidism -Continue Synthroid 100 mcg daily  Hyperlipidemia -Continue simvastatin 20 mg daily  GERD -Continue Protonix   DVT prophylaxis: SCDs Code Status:   Code Status: Full Code Family Communication: Daughter at bedside Disposition Plan: Discharge home  likely in 24 hours pending placement of port a cath   Consultants:  Gynecology/oncology  Procedures:  Paracentesis  Antimicrobials: None    Subjective: Dyspnea improved.  Objective: BP 112/70 (BP Location: Left Arm)   Pulse 83   Temp 98.5 F (36.9 C) (Oral)   Resp 18   Ht '5\' 6"'$  (1.676 m)   Wt 84.2 kg   SpO2 90%   BMI 29.96 kg/m   Examination:  General exam: Appears calm and comfortable Respiratory system: Clear to auscultation. Respiratory effort normal. Cardiovascular system: S1 & S2 heard, RRR. No murmurs, rubs, gallops or clicks. Gastrointestinal system: Abdomen is distended, soft and nontender. Normal bowel sounds heard. Central nervous system: Alert and oriented. No focal neurological deficits. Musculoskeletal: No edema. No calf tenderness Skin: No cyanosis. No rashes Psychiatry: Judgement and insight appear normal. Mood & affect appropriate.    Data Reviewed: I have personally reviewed following labs and imaging studies  CBC Lab Results  Component Value Date   WBC 14.5 (H) 03/16/2022   RBC 5.19 (H) 03/16/2022   HGB 15.9 (H) 03/16/2022   HCT 45.7 03/16/2022   MCV 88.1 03/16/2022   MCH 30.6 03/16/2022   PLT 364 03/16/2022   MCHC 34.8 03/16/2022   RDW 12.5 03/16/2022   LYMPHSABS 1.3 11/17/2020   EOSABS 0.3 11/17/2020   BASOSABS 0.1 95/63/8756     Last metabolic panel Lab Results  Component Value Date   NA 138 03/18/2022   K 4.2 03/18/2022   CL 114 (H) 03/18/2022   CO2 19 (L) 03/18/2022   BUN 21 03/18/2022   CREATININE 1.31 (H) 03/18/2022   GLUCOSE 88 03/18/2022   GFRNONAA 42 (L) 03/18/2022   GFRAA 61 05/20/2020   CALCIUM 7.7 (L) 03/18/2022   PHOS 3.6 11/05/2021   PROT  6.5 03/16/2022   ALBUMIN 3.8 03/16/2022   LABGLOB 2.5 05/13/2021   AGRATIO 1.7 05/13/2021   BILITOT 0.8 03/16/2022   ALKPHOS 62 03/16/2022   AST 22 03/16/2022   ALT 12 03/16/2022   ANIONGAP 5 03/18/2022    GFR: Estimated Creatinine Clearance: 39.3 mL/min (A) (by  C-G formula based on SCr of 1.31 mg/dL (H)).  Recent Results (from the past 240 hour(s))  Resp Panel by RT-PCR (Flu A&B, Covid) Anterior Nasal Swab     Status: None   Collection Time: 03/16/22  4:05 PM   Specimen: Anterior Nasal Swab  Result Value Ref Range Status   SARS Coronavirus 2 by RT PCR NEGATIVE NEGATIVE Final    Comment: (NOTE) SARS-CoV-2 target nucleic acids are NOT DETECTED.  The SARS-CoV-2 RNA is generally detectable in upper respiratory specimens during the acute phase of infection. The lowest concentration of SARS-CoV-2 viral copies this assay can detect is 138 copies/mL. A negative result does not preclude SARS-Cov-2 infection and should not be used as the sole basis for treatment or other patient management decisions. A negative result may occur with  improper specimen collection/handling, submission of specimen other than nasopharyngeal swab, presence of viral mutation(s) within the areas targeted by this assay, and inadequate number of viral copies(<138 copies/mL). A negative result must be combined with clinical observations, patient history, and epidemiological information. The expected result is Negative.  Fact Sheet for Patients:  EntrepreneurPulse.com.au  Fact Sheet for Healthcare Providers:  IncredibleEmployment.be  This test is no t yet approved or cleared by the [place] FDA and  has been authorized for detection and/or diagnosis of SARS-CoV-2 by FDA under an Emergency Use Authorization (EUA). This EUA will remain  in effect (meaning this test can be used) for the duration of the COVID-19 declaration under Section 564(b)(1) of the Act, 21 U.S.C.section 360bbb-3(b)(1), unless the authorization is terminated  or revoked sooner.       Influenza A by PCR NEGATIVE NEGATIVE Final   Influenza B by PCR NEGATIVE NEGATIVE Final    Comment: (NOTE) The Xpert Xpress SARS-CoV-2/FLU/RSV plus assay is intended as an aid in  the diagnosis of influenza from Nasopharyngeal swab specimens and should not be used as a sole basis for treatment. Nasal washings and aspirates are unacceptable for Xpert Xpress SARS-CoV-2/FLU/RSV testing.  Fact Sheet for Patients: EntrepreneurPulse.com.au  Fact Sheet for Healthcare Providers: IncredibleEmployment.be  This test is not yet approved or cleared by the [place] FDA and has been authorized for detection and/or diagnosis of SARS-CoV-2 by FDA under an Emergency Use Authorization (EUA). This EUA will remain in effect (meaning this test can be used) for the duration of the COVID-19 declaration under Section 564(b)(1) of the Act, 21 U.S.C. section 360bbb-3(b)(1), unless the authorization is terminated or revoked.  Performed at [facility], [address], [place], [place] [address]   Culture, body fluid w Gram Stain-bottle     Status: None (Preliminary result)   Collection Time: 03/17/22  1:26 PM   Specimen: Peritoneal Washings  Result Value Ref Range Status   Specimen Description PERITONEAL  Final   Special Requests NONE  Final   Culture   Final    NO GROWTH < 24 HOURS Performed at [facility], [place] [address], [place], [place] [address]    Report Status PENDING  Incomplete  Gram stain     Status: None   Collection Time: 03/17/22  1:26 PM   Specimen: Peritoneal Washings  Result Value  Ref Range Status   Specimen Description PERITONEAL  Final   Special Requests NONE  Final   Gram Stain   Final    NO WBC SEEN NO ORGANISMS SEEN Performed at [facility], [address], [place], [place] [address]    Report Status 03/17/2022 FINAL  Final      Radiology Studies: US Paracentesis  Result Date: 03/17/2022 INDICATION: Patient presented with complaint of shortness of breath, loss of appetite and sudden weight gain. Patient admitted for AKI, hyponatremia. Imaging found patient to have large right  ovarian mass and cirrhosis of liver with ascites. Request received for diagnostic and therapeutic paracentesis. EXAM: ULTRASOUND GUIDED DIAGNOSTIC AND THERAPEUTIC RIGHT LOWER QUADRANT PARACENTESIS MEDICATIONS: 10 mL 1 % lidocaine COMPLICATIONS: None immediate. PROCEDURE: Informed written consent was obtained from the patient after a discussion of the risks, benefits and alternatives to treatment. A timeout was performed prior to the initiation of the procedure. Initial ultrasound scanning demonstrates a moderate amount of ascites within the right lower abdominal quadrant. The right lower abdomen was prepped and draped in the usual sterile fashion. 1% lidocaine was used for local anesthesia. Following this, a 19 gauge, 7-cm, Yueh catheter was introduced. An ultrasound image was saved for documentation purposes. The paracentesis was performed. The catheter was removed and a dressing was applied. The patient tolerated the procedure well without immediate post procedural complication. FINDINGS: A total of approximately 3.1 L of clear, yellow fluid was removed. Samples were sent to the laboratory as requested by the clinical team. IMPRESSION: Successful ultrasound-guided paracentesis yielding 3.1 liters of peritoneal fluid. Read by: Narda Rutherford, AGNP-BC Electronically Signed   By: Ruthann Cancer M.D.   On: 03/17/2022 14:39   MR PELVIS W WO CONTRAST  Result Date: 03/17/2022 CLINICAL DATA:  Evaluate pelvic mass. EXAM: MRI PELVIS WITHOUT AND WITH CONTRAST TECHNIQUE: Multiplanar multisequence MR imaging of the pelvis was performed both before and after administration of intravenous contrast. CONTRAST:  1m GADAVIST GADOBUTROL 1 MMOL/ML IV SOLN COMPARISON:  CT AP 03/16/2022 FINDINGS: Urinary Tract:  No abnormality visualized. Bowel:  No dilated loops of large or small bowel identified. Vascular/Lymphatic: No significant vascular abnormality. No enlarged abdominopelvic lymph nodes. Reproductive: The uterus is surgically  absent. Within the right adnexa there is a large solid and cystic mass which measures, image 17/2 and image 21/29. The more anterior and solid component measures 9.5 by 6.3 by 8.0 cm, image 22/29. This shows internal enhancement on the postcontrast images. More posteriorly there is a cystic component which measures 3.7 x 3.0 by 3.7 cm. No normal right ovary identified. No left adnexal mass identified. The left ovary is not visualized. Other: There is a large volume of ascites within the abdomen and pelvis. Peritoneal nodularity is better seen on the CT of the abdomen pelvis performed on the previous day. Musculoskeletal: No suspicious bone lesions identified. IMPRESSION: 1. There is a large solid and cystic mass within the right adnexa which measures up to 9.5 cm in maximum dimension. Findings are concerning for a primary ovarian neoplasm. 2. Large volume of ascites within the abdomen and pelvis. Given the presence of peritoneal nodules on the CT from the prior day findings are concerning for malignant ascites. Electronically Signed   By: TKerby MoorsM.D.   On: 03/17/2022 11:57   UKoreaTransvaginal Non-OB  Result Date: 03/17/2022 CLINICAL DATA:  Pelvic mass. EXAM: TRANSABDOMINAL AND TRANSVAGINAL ULTRASOUND OF PELVIS TECHNIQUE: Both transabdominal and transvaginal ultrasound examinations of the pelvis were performed.  Transabdominal technique was performed for global imaging of the pelvis including uterus, ovaries, adnexal regions, and pelvic cul-de-sac. It was necessary to proceed with endovaginal exam following the transabdominal exam to visualize the ovaries. COMPARISON:  03/16/2022. FINDINGS: Uterus Surgically absent. Right ovary There is a masslike region in the right adnexa extending into the midline measuring 9.2 x 8.7 x 8.7 cm with internal vascularity and may represent the right ovary. A 5.7 x 5.3 x 6.1 cm heterogenous hypoechoic region is noted within the mass. Left ovary Not visualized on exam. Other  findings Moderate amount of free fluid in the pelvis, compatible with known ascites. IMPRESSION: 1. Large mass in the right adnexa extending in the midline with heterogeneous attenuation and internal vascularity measuring 9.2 x 8.7 x 8.7 cm. Findings may represent an ovarian mass versus other etiology. MRI with and without contrast is recommended for further characterization and OBGYN consultation is recommended. 2. Left ovary is not visualized on exam. 3. Uterus is surgically absent. 4. Moderate amount of free fluid in the pelvis, compatible with known ascites. Electronically Signed   By: Brett Fairy M.D.   On: 03/17/2022 00:19   US Pelvis Complete  Result Date: 03/17/2022 CLINICAL DATA:  Pelvic mass. EXAM: TRANSABDOMINAL AND TRANSVAGINAL ULTRASOUND OF PELVIS TECHNIQUE: Both transabdominal and transvaginal ultrasound examinations of the pelvis were performed. Transabdominal technique was performed for global imaging of the pelvis including uterus, ovaries, adnexal regions, and pelvic cul-de-sac. It was necessary to proceed with endovaginal exam following the transabdominal exam to visualize the ovaries. COMPARISON:  03/16/2022. FINDINGS: Uterus Surgically absent. Right ovary There is a masslike region in the right adnexa extending into the midline measuring 9.2 x 8.7 x 8.7 cm with internal vascularity and may represent the right ovary. A 5.7 x 5.3 x 6.1 cm heterogeneous hypoechoic region is noted within the mass. Left ovary Not visualized on exam. Pulsed Doppler evaluation of the right ovary demonstrates normal low-resistance arterial and venous waveforms. Other findings Moderate amount of free fluid in the pelvis, likely related to known ascites. IMPRESSION: 1. Large mass in the right adnexa extending into the midline with heterogeneous attenuation and internal vascularity measuring 9.2 x 8.7 x 8.7 cm. Findings may represent ovarian mass versus other etiology. MRI with and without contrast is recommended for  further characterization and OBGYN consultation is recommended. 2. Left ovary is not visualized on exam. 3. Uterus is surgically absent. 4. Moderate free fluid in the pelvis, compatible with known ascites. Electronically Signed   By: Brett Fairy M.D.   On: 03/17/2022 00:10      LOS: 1 day    Cordelia Poche, MD [facility] 03/18/2022, 4:10 PM   If 7PM-7AM, please contact night-coverage [email or web address]

## 2022-03-18 NOTE — Plan of Care (Signed)
  Problem: Safety: Goal: Ability to remain free from injury will improve Outcome: Progressing   Problem: Elimination: Goal: Will not experience complications related to bowel motility Outcome: Progressing Goal: Will not experience complications related to urinary retention Outcome: Progressing   Problem: Skin Integrity: Goal: Risk for impaired skin integrity will decrease Outcome: Progressing

## 2022-03-19 ENCOUNTER — Other Ambulatory Visit: Payer: Self-pay | Admitting: Hematology and Oncology

## 2022-03-19 ENCOUNTER — Inpatient Hospital Stay (HOSPITAL_COMMUNITY): Payer: Medicare Other

## 2022-03-19 ENCOUNTER — Telehealth: Payer: Self-pay | Admitting: Family Medicine

## 2022-03-19 HISTORY — PX: IR IMAGING GUIDED PORT INSERTION: IMG5740

## 2022-03-19 MED ORDER — MIDAZOLAM HCL 2 MG/2ML IJ SOLN
INTRAMUSCULAR | Status: AC
  Filled 2022-03-19: qty 4

## 2022-03-19 MED ORDER — MIDAZOLAM HCL 2 MG/2ML IJ SOLN
INTRAMUSCULAR | Status: AC | PRN
  Administered 2022-03-19 (×2): 1 mg via INTRAVENOUS

## 2022-03-19 MED ORDER — FENTANYL CITRATE (PF) 100 MCG/2ML IJ SOLN
INTRAMUSCULAR | Status: AC | PRN
  Administered 2022-03-19 (×2): 50 ug via INTRAVENOUS

## 2022-03-19 MED ORDER — LIDOCAINE-EPINEPHRINE 1 %-1:100000 IJ SOLN
INTRAMUSCULAR | Status: AC | PRN
  Administered 2022-03-19: 20 mL

## 2022-03-19 MED ORDER — LIDOCAINE-EPINEPHRINE 1 %-1:100000 IJ SOLN
INTRAMUSCULAR | Status: AC
  Filled 2022-03-19: qty 1

## 2022-03-19 MED ORDER — LIDOCAINE-EPINEPHRINE 1 %-1:100000 IJ SOLN
INTRAMUSCULAR | Status: AC
  Administered 2022-03-19: 20 mL via INTRADERMAL
  Filled 2022-03-19: qty 1

## 2022-03-19 MED ORDER — HEPARIN SOD (PORK) LOCK FLUSH 100 UNIT/ML IV SOLN
INTRAVENOUS | Status: AC
  Administered 2022-03-19: 500 [IU] via INTRAVENOUS
  Filled 2022-03-19: qty 5

## 2022-03-19 MED ORDER — FENTANYL CITRATE (PF) 100 MCG/2ML IJ SOLN
INTRAMUSCULAR | Status: AC
  Filled 2022-03-19: qty 2

## 2022-03-19 NOTE — Telephone Encounter (Signed)
Patient admitted to [facility] for about 4 days. Calling to make hospital follow up, only wants to see Dr Lajuana Ripple and needs to be seen within the week. Please call back and advise.

## 2022-03-19 NOTE — Telephone Encounter (Signed)
Patient will call back to let us know if she can come in on 11/17

## 2022-03-19 NOTE — Procedures (Signed)
Interventional Radiology Procedure Note  Procedure:  Chest port CT guided omental biopy US guided paracentesis  Indication:  Ovarian mass with possible malignant ascites and omental implants  Findings: Please refer to procedural dictation for full description.  Complications: None  EBL: < 10 mL  Miachel Roux, MD [phone]

## 2022-03-19 NOTE — Progress Notes (Signed)
START ON PATHWAY REGIMEN - Ovarian     A cycle is every 21 days:     Paclitaxel      Carboplatin   **Always confirm dose/schedule in your pharmacy ordering system**  Patient Characteristics: Preoperative or Nonsurgical Candidate (Clinical Staging), Newly Diagnosed, Neoadjuvant Therapy followed by Surgery BRCA Mutation Status: Awaiting Test Results Therapeutic Status: Preoperative or Nonsurgical Candidate (Clinical Staging) AJCC T Category: cT3 AJCC 8 Stage Grouping: Unknown AJCC N Category: cN0 AJCC M Category: cM0 Therapy Plan: Neoadjuvant Therapy followed by Surgery Intent of Therapy: Curative Intent, Discussed with Patient

## 2022-03-19 NOTE — Discharge Summary (Signed)
Physician Discharge Summary   Patient: April Weber MRN: [id number] DOB: May 28, 1945  Admit date:     03/16/2022  Discharge date: {dischdate:26783}  Discharge Physician: Cordelia Poche   PCP: [name], DO   Recommendations at discharge:  {Tip this will not be part of the note when signed- Example include specific recommendations for outpatient follow-up, pending tests to follow-up on. (Optional):26781}  ***  Discharge Diagnoses: Principal Problem:   Ovarian mass Active Problems:   HTN (hypertension)   Hypothyroidism   Acute renal failure superimposed on stage 3a chronic kidney disease (HCC)   Hyponatremia   Ascites   Pelvic mass   Carcinomatosis (HCC)  Resolved Problems:   * No resolved hospital problems. *  Hospital Course: [name] is a 76 y.o. is a female with a history of right breast cancer status post mastectomy, CKD stage IIIa, history of DVT not on anticoagulation, hypertension, hypothyroidism.  Patient presented secondary to abdominal distention and shortness of breath was found to have evidence of abdominal ascites in addition to a large right adnexal mass concerning for ovarian cancer.  Gynecology/oncology consulted and recommended MRI, CEA, CA 125.  MRI confirmed concern for primary right adnexal malignancy.  Paracentesis ordered for therapeutic/diagnostic purpose.  Assessment and Plan: No notes have been filed under this hospital service. Service: Hospitalist     {Tip this will not be part of the note when signed Body mass index is 29.89 kg/Weber. , ,  (Optional):26781}  {(NOTE) Pain control PDMP Statment (Optional):26782} Consultants: *** Procedures performed: ***  Disposition: {Plan; Disposition:26390} Diet recommendation:  {Diet_Plan:26776} DISCHARGE MEDICATION: Allergies as of 03/19/2022       Reactions   Codeine Nausea And Vomiting   Nsaids Other (See Comments)   Told to avoid due to kidney function   Penicillins Hives    Vibra-tab [doxycycline] Hives   Iodine Rash   Latex Rash   Occasionally gets a localized rash on contact with certain latex products        Medication List     STOP taking these medications    alendronate 70 MG tablet Commonly known as: FOSAMAX   losartan 100 MG tablet Commonly known as: COZAAR       TAKE these medications    acetaminophen 500 MG tablet Commonly known as: TYLENOL Take 500-1,000 mg by mouth daily as needed for moderate pain, fever or headache.   amLODipine 5 MG tablet Commonly known as: NORVASC Take 1 tablet (5 mg total) by mouth daily. What changed: when to take this   aspirin EC 81 MG tablet Take 81 mg by mouth at bedtime.   atenolol 25 MG tablet Commonly known as: TENORMIN Take 1 tablet (25 mg total) by mouth daily. What changed:  how much to take when to take this   CALCIUM + VITAMIN D3 PO Take 1 tablet by mouth 2 (two) times daily.   levothyroxine 100 MCG tablet Commonly known as: SYNTHROID Take 1/2 tablet on Sundays and 1 tablet daily all other days What changed:  how much to take how to take this when to take this additional instructions   loratadine 10 MG tablet Commonly known as: CLARITIN Take 10 mg by mouth at bedtime.   multivitamin tablet Take 1 tablet by mouth in the morning.   pantoprazole 40 MG tablet Commonly known as: PROTONIX Take 1 tablet (40 mg total) by mouth daily. X2-4 weeks   simvastatin 20 MG tablet Commonly known as: ZOCOR Take 1 tablet (20 mg  total) by mouth at bedtime. Cancel rx for '10mg'$ .        Follow-up Information     April Doss M, DO. Schedule an appointment as soon as possible for a visit in 1 week(s).   Specialty: Family Medicine Why: For hospital follow-up Contact information: [place] [address] [phone]         [name], MD. Go on 03/23/2022.   Specialty: Hematology and Oncology Contact information: [place]  [id number] [phone]                Discharge Exam: April Weber Weights   03/17/22 0610 03/18/22 0609 03/19/22 0500  Weight: 83.9 kg 84.2 kg 84 kg   ***  Condition at discharge: {DC Condition:26389}  The results of significant diagnostics from this hospitalization (including imaging, microbiology, ancillary and laboratory) are listed below for reference.   Imaging Studies: CT Paracentesis  Result Date: 03/19/2022 INDICATION: 76 year old woman with pelvic mass and recurrent ascites presents to IR for paracentesis EXAM: ULTRASOUND GUIDED  PARACENTESIS MEDICATIONS: None. COMPLICATIONS: None immediate. PROCEDURE: Informed written consent was obtained from the patient after a discussion of the risks, benefits and alternatives to treatment. A timeout was performed prior to the initiation of the procedure. Initial ultrasound scanning demonstrates a moderate amount of ascites within the right lower abdominal quadrant. The right lower abdomen was prepped and draped in the usual sterile fashion. 1% lidocaine was used for local anesthesia. Following this, a 8 Fr Safe-T-Centesis catheter was introduced. An ultrasound image was saved for documentation purposes. The paracentesis was performed. The catheter was removed and a dressing was applied. The patient tolerated the procedure well without immediate post procedural complication. FINDINGS: A total of approximately 4.2 L of straw-colored fluid fluid was removed. Samples were sent to the laboratory as requested by the clinical team. IMPRESSION: Successful ultrasound-guided paracentesis yielding 4.2 liters of peritoneal fluid. Electronically Signed   By: Miachel Roux Weber.D.   On: 03/19/2022 13:11   CT ABDOMINAL MASS BIOPSY  Result Date: 03/19/2022 INDICATION: 76 year old woman with pelvic mass, ascites, and questionable omental implants presents to IR for biopsy. EXAM: CT-guided biopsy of omental implants TECHNIQUE: Multidetector CT imaging of the  abdomen was performed following the standard protocol without IV contrast. RADIATION DOSE REDUCTION: This exam was performed according to the departmental dose-optimization program which includes automated exposure control, adjustment of the mA and/or kV according to patient size and/or use of iterative reconstruction technique. MEDICATIONS: None. ANESTHESIA/SEDATION: None COMPLICATIONS: None immediate. PROCEDURE: Informed written consent was obtained from the patient after a thorough discussion of the procedural risks, benefits and alternatives. All questions were addressed. Maximal Sterile Barrier Technique was utilized including caps, mask, sterile gowns, sterile gloves, sterile drape, hand hygiene and skin antiseptic. A timeout was performed prior to the initiation of the procedure. Patient positioned supine on the procedure table. The anterior abdominal wall skin prepped and draped in usual sterile fashion. Following local lidocaine administration, 17 gauge introducer needle advanced into the upper abdominal omentum in the region of stranding. Three 18 gauge cores were obtained and sent to pathology in formalin. No hemorrhage was identified on postprocedure CT scan. IMPRESSION: CT-guided biopsy of omental mass. Electronically Signed   By: Miachel Roux Weber.D.   On: 03/19/2022 13:10   IR IMAGING GUIDED PORT INSERTION  Result Date: 03/19/2022 INDICATION: 76 year old woman with pelvic mass and ascites presents to IR for chest port placement. EXAM: IMPLANTED PORT A CATH PLACEMENT WITH ULTRASOUND  AND FLUOROSCOPIC GUIDANCE MEDICATIONS: None ANESTHESIA/SEDATION: Moderate (conscious) sedation was employed during this procedure. A total of Versed 2 mg and Fentanyl 100 mcg was administered intravenously by the radiology nurse. Total intra-service moderate Sedation Time: 18 minutes. The patient's level of consciousness and vital signs were monitored continuously by radiology nursing throughout the procedure under my  direct supervision. FLUOROSCOPY: Radiation Exposure Index (as provided by the fluoroscopic device): 2 mGy Kerma COMPLICATIONS: None immediate. PROCEDURE: The procedure, risks, benefits, and alternatives were explained to the patient. Questions regarding the procedure were encouraged and answered. The patient understands and consents to the procedure. A timeout was performed prior to the initiation of the procedure. Patient positioned supine on the angiography table. Sonographic evaluation of the right neck shows a diminutive right internal jugular vein. Therefore decision was made to place a left-sided port. Left neck and anterior upper chest prepped and draped in the usual sterile fashion. All elements of maximal sterile barrier were utilized including, cap, mask, sterile gown, sterile gloves, large sterile drape, hand scrubbing and 2% Chlorhexidine for skin cleaning. The left internal jugular vein was evaluated with ultrasound and shown to be patent. A permanent ultrasound image was obtained and placed in the patient's medical record. Local anesthesia was provided with 1% lidocaine with epinephrine. Using sterile gel and a sterile probe cover, the left internal jugular vein was entered with a 21 ga needle during real time ultrasound guidance. 0.018 inch guidewire placed and 21 ga needle exchanged for transitional dilator set. Utilizing fluoroscopy, 0.035 inch guidewire advanced through the dilator without difficulty. Attention then turned to the left anterior upper chest. Following local lidocaine administration, a port pocket was created. The catheter was connected to the port and brought from the pocket to the venotomy site through a subcutaneous tunnel. The catheter was cut to size and inserted through the peel-away sheath. The catheter tip was positioned at the cavoatrial junction using fluoroscopic guidance. The port aspirated and flushed well. The port pocket was closed with deep and superficial absorbable  suture. The port pocket incision and venotomy sites were also sealed with Dermabond. IMPRESSION: Successful placement of a left internal jugular approach power injectable Port-A-Cath. The catheter is ready for immediate use. Electronically Signed   By: Miachel Roux Weber.D.   On: 03/19/2022 12:58   US Paracentesis  Result Date: 03/17/2022 INDICATION: Patient presented with complaint of shortness of breath, loss of appetite and sudden weight gain. Patient admitted for AKI, hyponatremia. Imaging found patient to have large right ovarian mass and cirrhosis of liver with ascites. Request received for diagnostic and therapeutic paracentesis. EXAM: ULTRASOUND GUIDED DIAGNOSTIC AND THERAPEUTIC RIGHT LOWER QUADRANT PARACENTESIS MEDICATIONS: 10 mL 1 % lidocaine COMPLICATIONS: None immediate. PROCEDURE: Informed written consent was obtained from the patient after a discussion of the risks, benefits and alternatives to treatment. A timeout was performed prior to the initiation of the procedure. Initial ultrasound scanning demonstrates a moderate amount of ascites within the right lower abdominal quadrant. The right lower abdomen was prepped and draped in the usual sterile fashion. 1% lidocaine was used for local anesthesia. Following this, a 19 gauge, 7-cm, Yueh catheter was introduced. An ultrasound image was saved for documentation purposes. The paracentesis was performed. The catheter was removed and a dressing was applied. The patient tolerated the procedure well without immediate post procedural complication. FINDINGS: A total of approximately 3.1 L of clear, yellow fluid was removed. Samples were sent to the laboratory as requested by the clinical team. IMPRESSION: Successful ultrasound-guided  paracentesis yielding 3.1 liters of peritoneal fluid. Read by: Narda Rutherford, AGNP-BC Electronically Signed   By: Ruthann Cancer Weber.D.   On: 03/17/2022 14:39   MR PELVIS W WO CONTRAST  Result Date: 03/17/2022 CLINICAL DATA:   Evaluate pelvic mass. EXAM: MRI PELVIS WITHOUT AND WITH CONTRAST TECHNIQUE: Multiplanar multisequence MR imaging of the pelvis was performed both before and after administration of intravenous contrast. CONTRAST:  34m GADAVIST GADOBUTROL 1 MMOL/ML IV SOLN COMPARISON:  CT AP 03/16/2022 FINDINGS: Urinary Tract:  No abnormality visualized. Bowel:  No dilated loops of large or small bowel identified. Vascular/Lymphatic: No significant vascular abnormality. No enlarged abdominopelvic lymph nodes. Reproductive: The uterus is surgically absent. Within the right adnexa there is a large solid and cystic mass which measures, image 17/2 and image 21/29. The more anterior and solid component measures 9.5 by 6.3 by 8.0 cm, image 22/29. This shows internal enhancement on the postcontrast images. More posteriorly there is a cystic component which measures 3.7 x 3.0 by 3.7 cm. No normal right ovary identified. No left adnexal mass identified. The left ovary is not visualized. Other: There is a large volume of ascites within the abdomen and pelvis. Peritoneal nodularity is better seen on the CT of the abdomen pelvis performed on the previous day. Musculoskeletal: No suspicious bone lesions identified. IMPRESSION: 1. There is a large solid and cystic mass within the right adnexa which measures up to 9.5 cm in maximum dimension. Findings are concerning for a primary ovarian neoplasm. 2. Large volume of ascites within the abdomen and pelvis. Given the presence of peritoneal nodules on the CT from the prior day findings are concerning for malignant ascites. Electronically Signed   By: TKerby MoorsM.D.   On: 03/17/2022 11:57   UKoreaTransvaginal Non-OB  Result Date: 03/17/2022 CLINICAL DATA:  Pelvic mass. EXAM: TRANSABDOMINAL AND TRANSVAGINAL ULTRASOUND OF PELVIS TECHNIQUE: Both transabdominal and transvaginal ultrasound examinations of the pelvis were performed. Transabdominal technique was performed for global imaging of the pelvis  including uterus, ovaries, adnexal regions, and pelvic cul-de-sac. It was necessary to proceed with endovaginal exam following the transabdominal exam to visualize the ovaries. COMPARISON:  03/16/2022. FINDINGS: Uterus Surgically absent. Right ovary There is a masslike region in the right adnexa extending into the midline measuring 9.2 x 8.7 x 8.7 cm with internal vascularity and may represent the right ovary. A 5.7 x 5.3 x 6.1 cm heterogenous hypoechoic region is noted within the mass. Left ovary Not visualized on exam. Other findings Moderate amount of free fluid in the pelvis, compatible with known ascites. IMPRESSION: 1. Large mass in the right adnexa extending in the midline with heterogeneous attenuation and internal vascularity measuring 9.2 x 8.7 x 8.7 cm. Findings may represent an ovarian mass versus other etiology. MRI with and without contrast is recommended for further characterization and OBGYN consultation is recommended. 2. Left ovary is not visualized on exam. 3. Uterus is surgically absent. 4. Moderate amount of free fluid in the pelvis, compatible with known ascites. Electronically Signed   By: LBrett FairyM.D.   On: 03/17/2022 00:19   [place]oreaPelvis Complete  Result Date: 03/17/2022 CLINICAL DATA:  Pelvic mass. EXAM: TRANSABDOMINAL AND TRANSVAGINAL ULTRASOUND OF PELVIS TECHNIQUE: Both transabdominal and transvaginal ultrasound examinations of the pelvis were performed. Transabdominal technique was performed for global imaging of the pelvis including uterus, ovaries, adnexal regions, and pelvic cul-de-sac. It was necessary to proceed with endovaginal exam following the transabdominal exam to visualize the ovaries. COMPARISON:  03/16/2022.  FINDINGS: Uterus Surgically absent. Right ovary There is a masslike region in the right adnexa extending into the midline measuring 9.2 x 8.7 x 8.7 cm with internal vascularity and may represent the right ovary. A 5.7 x 5.3 x 6.1 cm heterogeneous hypoechoic  region is noted within the mass. Left ovary Not visualized on exam. Pulsed Doppler evaluation of the right ovary demonstrates normal low-resistance arterial and venous waveforms. Other findings Moderate amount of free fluid in the pelvis, likely related to known ascites. IMPRESSION: 1. Large mass in the right adnexa extending into the midline with heterogeneous attenuation and internal vascularity measuring 9.2 x 8.7 x 8.7 cm. Findings may represent ovarian mass versus other etiology. MRI with and without contrast is recommended for further characterization and OBGYN consultation is recommended. 2. Left ovary is not visualized on exam. 3. Uterus is surgically absent. 4. Moderate free fluid in the pelvis, compatible with known ascites. Electronically Signed   By: Brett Fairy Weber.D.   On: 03/17/2022 00:10   CT ABDOMEN PELVIS WO CONTRAST  Result Date: 03/16/2022 CLINICAL DATA:  Abdominal pain, shortness of breath, abdominal bloating and distention, weight gain * Tracking Code: BO * EXAM: CT ABDOMEN AND PELVIS WITHOUT CONTRAST TECHNIQUE: Multidetector CT imaging of the abdomen and pelvis was performed following the standard protocol without IV contrast. RADIATION DOSE REDUCTION: This exam was performed according to the departmental dose-optimization program which includes automated exposure control, adjustment of the mA and/or kV according to patient size and/or use of iterative reconstruction technique. COMPARISON:  None Available. FINDINGS: Lower chest: No acute abnormality. Coronary artery calcifications. Scarring and or atelectasis of the lung bases. Hepatobiliary: Coarse shrunken cirrhotic morphology of the liver. No obvious solid liver abnormality is seen. No gallstones, gallbladder wall thickening, or biliary dilatation. Pancreas: Unremarkable. No pancreatic ductal dilatation or surrounding inflammatory changes. Spleen: Normal in size without significant abnormality. Adrenals/Urinary Tract: Adrenal glands are  unremarkable. Severely atrophic left kidney. Simple, benign left renal cortical cysts, for which no further follow-up or characterization is required. The right kidney is normal in noncontrast appearance, without renal calculi, obvious solid lesion, or hydronephrosis. Bladder is unremarkable. Stomach/Bowel: Stomach is within normal limits. Large diverticulum of the descending duodenum. Appendix is not clearly visualized and may be surgically absent. No evidence of bowel wall thickening, distention, or inflammatory changes. Pancolonic diverticulosis. Vascular/Lymphatic: Aortic atherosclerosis. No enlarged abdominal or pelvic lymph nodes. Reproductive: Status post hysterectomy. Large, mixed solid and cystic mass in the low pelvis, difficult to clearly delineate from adjacent ascites although at least 10.2 x 10.0 cm (series 2, image 75). Other: No abdominal wall hernia or abnormality. Large volume ascites throughout the abdomen and pelvis. Stranding and nodularity of the omentum and peritoneum, particularly in the ventral abdomen and left upper quadrant (series 2, image 25). Musculoskeletal: No acute or significant osseous findings. IMPRESSION: 1. Large, mixed solid and cystic mass in the low pelvis, difficult to clearly delineate from adjacent ascites although at least 10.2 x 10.0 cm. This is highly concerning for primary ovarian malignancy and most likely arises from the right ovary, although may involve both ovaries. This may be further evaluated by contrast enhanced MRI of the pelvis if desired. 2. Large volume ascites throughout the abdomen and pelvis, presumed malignant. Stranding and nodularity of the omentum and peritoneum, particularly in the ventral abdomen and left upper quadrant, highly suspicious for peritoneal metastatic disease. 3. Cirrhotic morphology of the liver, which may contribute to ascites. 4. Severely atrophic left kidney, consistent with remote  prior infectious, obstructive, or ischemic insult.  5. Coronary artery disease. Aortic Atherosclerosis (ICD10-I70.0). Electronically Signed   By: Delanna Ahmadi Weber.D.   On: 03/16/2022 15:59   DG Chest 2 View  Result Date: 03/16/2022 CLINICAL DATA:  Shortness of breath EXAM: CHEST - 2 VIEW COMPARISON:  None Available. FINDINGS: Cardiac size is within normal limits. There are no signs of pulmonary edema or focal pulmonary consolidation. Linear densities in left lower lung fields may suggest scarring or subsegmental atelectasis. Left hemidiaphragm is elevated. There is no pleural effusion or pneumothorax. IMPRESSION: There are no signs of pulmonary edema or focal pulmonary consolidation. Small linear densities in left lower lung fields suggest scarring or subsegmental atelectasis. Electronically Signed   By: Elmer Picker Weber.D.   On: 03/16/2022 15:19   DG Abd 1 View  Result Date: 03/16/2022 CLINICAL DATA:  Abdominal distention EXAM: ABDOMEN - 1 VIEW COMPARISON:  None Available. FINDINGS: Bowel gas pattern is nonspecific. No abnormal masses or calcifications are seen. Kidneys are partly obscured by bowel contents. Vascular calcifications are seen. Marked degenerative changes are noted in lumbar spine. IMPRESSION: Nonspecific bowel gas pattern.  Lumbar spondylosis. Electronically Signed   By: Elmer Picker Weber.D.   On: 03/16/2022 15:12    Microbiology: Results for orders placed or performed during the hospital encounter of 03/16/22  Resp Panel by RT-PCR (Flu A&B, Covid) Anterior Nasal Swab     Status: None   Collection Time: 03/16/22  4:05 PM   Specimen: Anterior Nasal Swab  Result Value Ref Range Status   SARS Coronavirus 2 by RT PCR NEGATIVE NEGATIVE Final    Comment: (NOTE) SARS-CoV-2 target nucleic acids are NOT DETECTED.  The SARS-CoV-2 RNA is generally detectable in upper respiratory specimens during the acute phase of infection. The lowest concentration of SARS-CoV-2 viral copies this assay can detect is 138 copies/mL. A negative  result does not preclude SARS-Cov-2 infection and should not be used as the sole basis for treatment or other patient management decisions. A negative result may occur with  improper specimen collection/handling, submission of specimen other than nasopharyngeal swab, presence of viral mutation(s) within the areas targeted by this assay, and inadequate number of viral copies(<138 copies/mL). A negative result must be combined with clinical observations, patient history, and epidemiological information. The expected result is Negative.  Fact Sheet for Patients:  EntrepreneurPulse.com.au  Fact Sheet for Healthcare Providers:  IncredibleEmployment.be  This test is no t yet approved or cleared by the [place] FDA and  has been authorized for detection and/or diagnosis of SARS-CoV-2 by FDA under an Emergency Use Authorization (EUA). This EUA will remain  in effect (meaning this test can be used) for the duration of the COVID-19 declaration under Section 564(b)(1) of the Act, 21 U.S.C.section 360bbb-3(b)(1), unless the authorization is terminated  or revoked sooner.       Influenza A by PCR NEGATIVE NEGATIVE Final   Influenza B by PCR NEGATIVE NEGATIVE Final    Comment: (NOTE) The Xpert Xpress SARS-CoV-2/FLU/RSV plus assay is intended as an aid in the diagnosis of influenza from Nasopharyngeal swab specimens and should not be used as a sole basis for treatment. Nasal washings and aspirates are unacceptable for Xpert Xpress SARS-CoV-2/FLU/RSV testing.  Fact Sheet for Patients: EntrepreneurPulse.com.au  Fact Sheet for Healthcare Providers: IncredibleEmployment.be  This test is not yet approved or cleared by the [place] FDA and has been authorized for detection and/or diagnosis of SARS-CoV-2 by FDA under an Emergency Use Authorization (EUA). This  EUA will remain in effect (meaning this test can be used)  for the duration of the COVID-19 declaration under Section 564(b)(1) of the Act, 21 U.S.C. section 360bbb-3(b)(1), unless the authorization is terminated or revoked.  Performed at [facility], [address] [place], [place] [address]   Culture, body fluid w Gram Stain-bottle     Status: None (Preliminary result)   Collection Time: 03/17/22  1:26 PM   Specimen: Peritoneal Washings  Result Value Ref Range Status   Specimen Description PERITONEAL  Final   Special Requests NONE  Final   Culture   Final    NO GROWTH 2 DAYS Performed at [facility], [address] [place], [place] [address]    Report Status PENDING  Incomplete  Gram stain     Status: None   Collection Time: 03/17/22  1:26 PM   Specimen: Peritoneal Washings  Result Value Ref Range Status   Specimen Description PERITONEAL  Final   Special Requests NONE  Final   Gram Stain   Final    NO WBC SEEN NO ORGANISMS SEEN Performed at [facility] [facility], [address], [place], [place] [address]    Report Status 03/17/2022 FINAL  Final    Labs: CBC: Recent Labs  Lab 03/16/22 1417  WBC 14.5*  HGB 15.9*  HCT 45.7  MCV 88.1  PLT 428   Basic Metabolic Panel: Recent Labs  Lab 03/16/22 1417 03/17/22 0641 03/18/22 0501  NA 129* 133* 138  K 4.3 4.0 4.2  CL 99 109 114*  CO2 18* 17* 19*  GLUCOSE 109* 95 88  BUN 24* 23 21  CREATININE 1.80* 1.62* 1.31*  CALCIUM 9.3 7.6* 7.7*   Liver Function Tests: Recent Labs  Lab 03/16/22 1417  AST 22  ALT 12  ALKPHOS 62  BILITOT 0.8  PROT 6.5  ALBUMIN 3.8   CBG: No results for input(s): "GLUCAP" in the last 168 hours.  Discharge time spent: {LESS THAN/GREATER JGOT:15726} 30 minutes.  Signed: Cordelia Poche, MD [facility] 03/19/2022

## 2022-03-19 NOTE — Progress Notes (Signed)
Patient ID: April Weber, female   DOB: Jul 02, 1945, 76 y.o.   MRN: [id number] Request received this am from Dr. Berline Lopes to perform image guided omental biopsy on pt. Imaging reviewed by Dr. Dwaine Gale. Pt already scheduled for port placement today as well- see recent consult note for additional med hx details. Risks and benefits of biopsy procedure were discussed with the patient /daughter including, but not limited to bleeding, infection, damage to adjacent structures or low yield requiring additional tests.  All of the questions were answered and there is agreement to proceed.  Consent signed and in chart.

## 2022-03-19 NOTE — Progress Notes (Signed)
Gynecologic Oncology Progress Note  76 year old female currently admitted with evidence of advanced ovarian cancer on CT imaging. Paracentesis cytology is negative for malignant cells. Dr. Berline Lopes spoke with IR who will look with ultrasound today when down for port placement to see if there is an accessible area for biopsy.  Dr. Berline Lopes updated patient's daughter at 10:18 am.

## 2022-03-19 NOTE — Progress Notes (Signed)
Mobility Specialist - Progress Note   03/19/22 0859  Mobility  Activity Ambulated independently in hallway  Level of Assistance Independent after set-up  Assistive Device None  Distance Ambulated (ft) 250 ft  Activity Response Tolerated well  Mobility Referral Yes  $Mobility charge 1 Mobility   Pt received in bed and agreed to mobility, no c/o pain nor discomfort. Session cut short due to transport for procedure arriving. Pt returned to bed with all needs met and family and staff in room.  Roderick Pee Mobility Specialist

## 2022-03-19 NOTE — Discharge Instructions (Signed)
April Weber,  You were in the hospital with concern for a possible ovarian cancer. You also had some fluid within your abdomen. You have had this fluid drained which has helped your ability to breath. You have also gotten a port placed and biopsy to confirm diagnosis of your possible ovarian cancer. Please follow-up with your new oncologists.

## 2022-03-19 NOTE — Sedation Documentation (Signed)
No additional sedation given

## 2022-03-19 NOTE — TOC Initial Note (Signed)
Transition of Care [facility]) - Initial/Assessment Note    Patient Details  Name: AME HEAGLE MRN: [id number] Date of Birth: [birth year]  Transition of Care [facility]) CM/SW Contact:    Angelita Ingles, RN Phone [phone]  03/19/2022, 1:36 PM  Clinical Narrative:                  Transition of Care [facility]) Screening Note   Patient Details  Name: BERNELL HAYNIE Date of Birth: Jul 08, 1945   Transition of Care [facility]) CM/SW Contact:    Angelita Ingles, RN Phone Number: 03/19/2022, 1:36 PM    Transition of Care Department [facility]) has reviewed patient and no TOC needs have been identified at this time. We will continue to monitor patient advancement through interdisciplinary progression rounds. If new patient transition needs arise, please place a TOC consult.          Patient Goals and CMS Choice        Expected Discharge Plan and Services                                                Prior Living Arrangements/Services                       Activities of Daily Living Home Assistive Devices/Equipment: Eyeglasses ADL Screening (condition at time of admission) Patient's cognitive ability adequate to safely complete daily activities?: No Is the patient deaf or have difficulty hearing?: No Does the patient have difficulty seeing, even when wearing glasses/contacts?: No Does the patient have difficulty concentrating, remembering, or making decisions?: No Patient able to express need for assistance with ADLs?: Yes Does the patient have difficulty dressing or bathing?: No Independently performs ADLs?: Yes (appropriate for developmental age) Does the patient have difficulty walking or climbing stairs?: Yes (r/t SOB) Weakness of Legs: None Weakness of Arms/Hands: None  Permission Sought/Granted                  Emotional Assessment              Admission diagnosis:  Pelvic mass [R19.00] Ovarian mass [N83.8] Other ascites  [R18.8] Patient Active Problem List   Diagnosis Date Noted   Pelvic mass 03/18/2022   Carcinomatosis ([place]) 03/18/2022   Acute renal failure superimposed on stage 3a chronic kidney disease ([place]) 03/17/2022   Hyponatremia 03/17/2022   Ascites 03/17/2022   Ovarian mass 03/16/2022   Vaginal dryness 10/22/2019   Vaginal atrophy 10/22/2019   Stage 3a chronic kidney disease ([place]) 02/09/2019   History of mastectomy 06/23/2018   Dupuytren contracture 02/20/2018   Hand pain 02/13/2018   Need for prophylactic vaccination and inoculation against influenza 02/13/2013   Hypothyroidism 08/04/2012   Osteopenia with high risk of fracture 08/12/2010   Breast CA ([place]) 08/12/2010   Hyperlipidemia 08/12/2010   HTN (hypertension) 08/12/2010   PCP:  [name], DO Pharmacy:   [facility] [address] [place] [address] [place] [address] Phone: [phone] Fax: [phone]     Social Determinants of Health (SDOH) Interventions Housing Interventions: Intervention Not Indicated  Readmission Risk Interventions     No data to display

## 2022-03-21 ENCOUNTER — Other Ambulatory Visit: Payer: Self-pay

## 2022-03-22 ENCOUNTER — Telehealth: Payer: Self-pay

## 2022-03-22 LAB — CULTURE, BODY FLUID W GRAM STAIN -BOTTLE: Culture: NO GROWTH

## 2022-03-22 LAB — SURGICAL PATHOLOGY

## 2022-03-22 LAB — CYTOLOGY - NON PAP

## 2022-03-22 NOTE — Telephone Encounter (Signed)
Transition Care Management Follow-up Telephone Call Date of discharge and from where: 03/19/2022  April Weber  How have you been since you were released from the hospital? Sad but better with fluid removal Dx: Ovarian Cancer  Any questions or concerns? No  Items Reviewed: Did the pt receive and understand the discharge instructions provided? Yes  Medications obtained and verified? Yes  Other? No  Any new allergies since your discharge? No  Dietary orders reviewed? No Do you have support at home? Yes   Home Care and Equipment/Supplies: Were home health services ordered? no If so, what is the name of the agency? N/a  Has the agency set up a time to come to the patient's home? not applicable Were any new equipment or medical supplies ordered?  No What is the name of the medical supply agency? N/a Were you able to get the supplies/equipment? not applicable Do you have any questions related to the use of the equipment or supplies? No  Functional Questionnaire: (I = Independent and D = Dependent) ADLs: I  Bathing/Dressing- I with rest intermittent   Meal Prep- I  Eating- I  Maintaining continence- I  Transferring/Ambulation- I  Managing Meds- I  Follow up appointments reviewed:  PCP Hospital f/u appt confirmed? Yes  Scheduled to see Evelina Dun  on 03/25/2022 @ 340. [place] [facility] f/u appt confirmed? Yes  Scheduled to see Dr.Ni Gorsuch  on 03/23/2022 @ 100pm. Are transportation arrangements needed? Yes  If their condition worsens, is the pt aware to call PCP or go to the Emergency Dept.? Yes Was the patient provided with contact information for the PCP's office or ED? Yes Was to pt encouraged to call back with questions or concerns? Yes

## 2022-03-23 ENCOUNTER — Inpatient Hospital Stay: Payer: Medicare Other | Admitting: Hematology and Oncology

## 2022-03-23 ENCOUNTER — Telehealth: Payer: Self-pay | Admitting: *Deleted

## 2022-03-23 ENCOUNTER — Telehealth: Payer: Self-pay | Admitting: Gynecologic Oncology

## 2022-03-23 ENCOUNTER — Other Ambulatory Visit: Payer: Self-pay | Admitting: Hematology and Oncology

## 2022-03-23 ENCOUNTER — Encounter: Payer: Self-pay | Admitting: Gynecologic Oncology

## 2022-03-23 ENCOUNTER — Inpatient Hospital Stay (HOSPITAL_BASED_OUTPATIENT_CLINIC_OR_DEPARTMENT_OTHER): Payer: Medicare Other | Admitting: Gynecologic Oncology

## 2022-03-23 ENCOUNTER — Inpatient Hospital Stay: Payer: Medicare Other | Attending: Gynecologic Oncology | Admitting: Gynecologic Oncology

## 2022-03-23 VITALS — BP 141/76 | HR 82 | Temp 97.5°F | Resp 16 | Ht 67.32 in | Wt 179.0 lb

## 2022-03-23 DIAGNOSIS — R1900 Intra-abdominal and pelvic swelling, mass and lump, unspecified site: Secondary | ICD-10-CM | POA: Diagnosis not present

## 2022-03-23 DIAGNOSIS — Z853 Personal history of malignant neoplasm of breast: Secondary | ICD-10-CM | POA: Diagnosis not present

## 2022-03-23 DIAGNOSIS — C800 Disseminated malignant neoplasm, unspecified: Secondary | ICD-10-CM

## 2022-03-23 DIAGNOSIS — R5383 Other fatigue: Secondary | ICD-10-CM | POA: Diagnosis not present

## 2022-03-23 DIAGNOSIS — R110 Nausea: Secondary | ICD-10-CM | POA: Insufficient documentation

## 2022-03-23 DIAGNOSIS — R180 Malignant ascites: Secondary | ICD-10-CM

## 2022-03-23 DIAGNOSIS — N838 Other noninflammatory disorders of ovary, fallopian tube and broad ligament: Secondary | ICD-10-CM

## 2022-03-23 MED ORDER — TRAMADOL HCL 50 MG PO TABS: 50.0000 mg | ORAL_TABLET | Freq: Four times a day (QID) | ORAL | 0 refills | Status: DC | PRN

## 2022-03-23 MED ORDER — SENNOSIDES-DOCUSATE SODIUM 8.6-50 MG PO TABS: 2.0000 | ORAL_TABLET | Freq: Every day | ORAL | 0 refills | Status: DC

## 2022-03-23 NOTE — Telephone Encounter (Signed)
Called the patient to discuss recent results.  Both her repeat paracentesis and her omental biopsy are nondiagnostic for malignancy.  Called to discuss need to proceed with surgery.  She was scheduled to see Dr. Alvy Bimler today at 1 PM.  I told her to come to the cancer center as planned but, little bit early as I can see her at 2 for a preoperative visit to discuss surgery.  Jeral Pinch MD Gynecologic Oncology

## 2022-03-23 NOTE — H&P (View-Only) (Signed)
Gynecologic Oncology Return Clinic Visit  03/23/22  Reason for Visit: treatment planning  Treatment History: Oncology History  Carcinomatosis ([place])  03/18/2022 Initial Diagnosis   Carcinomatosis ([place])   03/26/2022 - 03/26/2022 Chemotherapy   Patient is on Treatment Plan : OVARIAN Carboplatin (AUC 6) + Paclitaxel (175) q21d X 6 Cycles      CT A/P on 03/16/22: 1. Large, mixed solid and cystic mass in the low pelvis, difficult to clearly delineate from adjacent ascites although at least 10.2 x 10.0 cm. This is highly concerning for primary ovarian malignancy and most likely arises from the right ovary, although may involve both ovaries. This may be further evaluated by contrast enhanced MRI of the pelvis if desired. 2. Large volume ascites throughout the abdomen and pelvis, presumed malignant. Stranding and nodularity of the omentum and peritoneum, particularly in the ventral abdomen and left upper quadrant, highly suspicious for peritoneal metastatic disease. 3. Cirrhotic morphology of the liver, which may contribute to ascites. 4. Severely atrophic left kidney, consistent with remote prior infectious, obstructive, or ischemic insult. 5. Coronary artery disease.  Pelvic ultrasound 03/16/22: 1. Large mass in the right adnexa extending in the midline with heterogeneous attenuation and internal vascularity measuring 9.2 x 8.7 x 8.7 cm. Findings may represent an ovarian mass versus other etiology. MRI with and without contrast is recommended for further characterization and OBGYN consultation is recommended. 2. Left ovary is not visualized on exam. 3. Uterus is surgically absent. 4. Moderate amount of free fluid in the pelvis, compatible with known ascites.  MRI pelvis 03/17/22: 1. There is a large solid and cystic mass within the right adnexa which measures up to 9.5 cm in maximum dimension. Findings are concerning for a primary ovarian neoplasm. 2. Large volume of ascites within the abdomen  and pelvis. Given the presence of peritoneal nodules on the CT from the prior day findings are concerning for malignant ascites.  Paracentesis 11/8: No malignant cells identified Paracentesis 11/10: No malignant cells identified Omental biopsy 11/10: Benign mesothelial lined fibroadipose tissue, no carcinoma  Interval History: Doing well since discharge.  Continue to use the stool softener initially, has stopped this now.  Had some diarrhea this morning.  Appetite has improved some since second paracentesis but still not feeling very hungry.  Also has fatigue.  Is trying to eat smaller more frequent snacks and meals throughout the day.  Has occasional very mild nausea, no emesis.  Denies any urinary symptoms, has worked to increase her water intake.  Past Medical/Surgical History: Past Medical History:  Diagnosis Date   Breast cancer ([place]) 1980   RIGHT   Chronic kidney disease    has one kidney   DVT (deep venous thrombosis) ([facility])    on RIGHT leg-hx of   Hyperlipidemia    on meds   Hypertension    on meds   Osteopenia    PONV (postoperative nausea and vomiting)    Seasonal allergies    Thyroid disease    on meds    Past Surgical History:  Procedure Laterality Date   ABDOMINAL HYSTERECTOMY     APPENDECTOMY     BREAST BIOPSY     BREAST LUMPECTOMY WITH RADIOACTIVE SEED LOCALIZATION Left 12/01/2021   Procedure: LEFT BREAST RADIOACTIVE SEED GUIDED LUMPECTOMY;  Surgeon: Coralie Keens, MD;  Location: [facility];  Service: General;  Laterality: Left;  LMA   COLONOSCOPY  2011   Dr.Kaplan-normal   EYE SURGERY     76 years old  IR IMAGING GUIDED PORT INSERTION  03/19/2022   MASTECTOMY Right 1984   ROTATOR CUFF REPAIR     THYROID SURGERY Bilateral 1967   VARICOSE VEIN SURGERY     ablation   WISDOM TOOTH EXTRACTION      Family History  Problem Relation Age of Onset   Heart attack Mother    Alcohol abuse Mother    Brain cancer Mother    Lung cancer Mother     Breast cancer Maternal Aunt    Colon polyps Neg Hx    Colon cancer Neg Hx    Esophageal cancer Neg Hx    Stomach cancer Neg Hx    Rectal cancer Neg Hx     Social History   Socioeconomic History   Marital status: Single    Spouse name: Divorced    Number of children: 1   Years of education: Not on file   Highest education level: Not on file  Occupational History   Occupation: Retired    Comment: worked at [employer] in [employer]  Tobacco Use   Smoking status: Former    Packs/day: 0.25    Types: Cigarettes    Quit date: 05/11/1967    Years since quitting: 54.9   Smokeless tobacco: Never  Vaping Use   Vaping Use: Never used  Substance and Sexual Activity   Alcohol use: No   Drug use: No   Sexual activity: Not Currently    Birth control/protection: Post-menopausal, Surgical    Comment: hyst  Other Topics Concern   Not on file  Social History Narrative   Lives alone - very close with her neighbors   Social Determinants of Health   Financial Resource Strain: Low Risk  (08/26/2021)   Overall Financial Resource Strain (CARDIA)    Difficulty of Paying Living Expenses: Not hard at all  Food Insecurity: No Food Insecurity (03/17/2022)   Hunger Vital Sign    Worried About Running Out of Food in the Last Year: Never true    Ran Out of Food in the Last Year: Never true  Transportation Needs: No Transportation Needs (03/17/2022)   PRAPARE - Hydrologist (Medical): No    Lack of Transportation (Non-Medical): No  Physical Activity: Sufficiently Active (08/26/2021)   Exercise Vital Sign    Days of Exercise per Week: 7 days    Minutes of Exercise per Session: 30 min  Stress: No Stress Concern Present (08/26/2021)   [place]    Feeling of Stress : Not at all  Social Connections: Moderately Integrated (08/26/2021)   Social Connection and Isolation Panel  [NHANES]    Frequency of Communication with Friends and Family: More than three times a week    Frequency of Social Gatherings with Friends and Family: More than three times a week    Attends Religious Services: More than 4 times per year    Active Member of Clubs or Organizations: Yes    Attends Music therapist: More than 4 times per year    Marital Status: Divorced    Current Medications:  Current Outpatient Medications:    senna-docusate (SENOKOT-S) 8.6-50 MG tablet, Take 2 tablets by mouth at bedtime. For AFTER surgery, do not take if having diarrhea, Disp: 30 tablet, Rfl: 0   traMADol (ULTRAM) 50 MG tablet, Take 1 tablet (50 mg total) by mouth every 6 (six) hours as needed for severe pain. For AFTER surgery  only, do not take and drive, Disp: 10 tablet, Rfl: 0   acetaminophen (TYLENOL) 500 MG tablet, Take 500-1,000 mg by mouth daily as needed for moderate pain, fever or headache., Disp: , Rfl:    amLODipine (NORVASC) 5 MG tablet, Take 1 tablet (5 mg total) by mouth daily. (Patient taking differently: Take 5 mg by mouth in the morning.), Disp: 90 tablet, Rfl: 3   aspirin 81 MG EC tablet, Take 81 mg by mouth at bedtime., Disp: , Rfl:    atenolol (TENORMIN) 25 MG tablet, Take 1 tablet (25 mg total) by mouth daily. (Patient taking differently: Take 12.5 mg by mouth 2 (two) times daily.), Disp: 90 tablet, Rfl: 3   Calcium Carb-Cholecalciferol (CALCIUM + VITAMIN D3 PO), Take 1 tablet by mouth 2 (two) times daily., Disp: , Rfl:    levothyroxine (SYNTHROID) 100 MCG tablet, Take 1/2 tablet on Sundays and 1 tablet daily all other days (Patient taking differently: Take 100 mcg by mouth in the morning.), Disp: 90 tablet, Rfl: 3   loratadine (CLARITIN) 10 MG tablet, Take 10 mg by mouth at bedtime., Disp: , Rfl:    Multiple Vitamin (MULTIVITAMIN) tablet, Take 1 tablet by mouth in the morning., Disp: , Rfl:    pantoprazole (PROTONIX) 40 MG tablet, Take 1 tablet (40 mg total) by mouth daily.  X2-4 weeks (Patient not taking: Reported on 03/17/2022), Disp: 90 tablet, Rfl: 0   simvastatin (ZOCOR) 20 MG tablet, Take 1 tablet (20 mg total) by mouth at bedtime. Cancel rx for '10mg'$ ., Disp: 90 tablet, Rfl: 3  Review of Systems: + Decreased appetite, shortness of breath, bloating Denies fevers, chills, fatigue, unexplained weight changes. Denies hearing loss, neck lumps or masses, mouth sores, ringing in ears or voice changes. Denies cough or wheezing.   Denies chest pain or palpitations. Denies leg swelling. Denies abdominal pain, blood in stools, constipation, diarrhea, nausea, vomiting, or early satiety. Denies pain with intercourse, dysuria, frequency, hematuria or incontinence. Denies hot flashes, pelvic pain, vaginal bleeding or vaginal discharge.   Denies joint pain, back pain or muscle pain/cramps. Denies itching, rash, or wounds. Denies dizziness, headaches, numbness or seizures. Denies swollen lymph nodes or glands, denies easy bruising or bleeding. Denies anxiety, depression, confusion, or decreased concentration.  Physical Exam: BP (!) 141/76 (BP Location: Right Arm, Patient Position: Sitting)   Pulse 82   Temp (!) 97.5 F (36.4 C) (Oral)   Resp 16   Ht 5' 7.32" (1.71 m)   Wt 179 lb (81.2 kg)   SpO2 97%   BMI 27.77 kg/m  General: Alert, oriented, no acute distress. HEENT: Posterior oropharynx clear, sclera anicteric. Chest: Clear to auscultation bilaterally.  No wheezes or rhonchi. Cardiovascular: Regular rate and rhythm, no murmurs. Abdomen: soft, nontender, mildly distended.  Normoactive bowel sounds.  No masses or hepatosplenomegaly appreciated.  + fluid wave. Extremities: Grossly normal range of motion.  Warm, well perfused.  No edema bilaterally. Skin: No rashes or lesions noted. Lymphatics: No cervical, supraclavicular, or inguinal adenopathy. GU: Normal appearing external genitalia without erythema, excoriation, or lesions.  Speculum exam reveals atrophic  vaginal mucosa, no lesions noted.  Cuff intact, no nodularity or masses.  Bimanual exam reveals fluid within the cul-de-sac, no nodularity palpated, mass felt only with abdominal hand, sits somewhat out of the pelvis.  Rectovaginal exam confirms findings.  Laboratory & Radiologic Studies: None new  Assessment & Plan: April Weber is a 76 y.o. woman with imaging findings concerning for metastatic ovarian cancer with nondiagnostic  paracentesis and omental biopsy.  Discussed with patient most recent paracentesis and biopsy.  Unfortunately, neither 1 of these provided definitive diagnosis to allow [place] to proceed with neoadjuvant chemotherapy.  Thus, I have recommended that we proceed with surgery.  Plan will be to start out with diagnostic laparoscopy.  If findings at the time of surgery indicate inability to achieve maximal cytoreduction, we will plan to biopsy and sent for frozen section to assure that this will help [place] achieve a diagnosis.  If no significant metastatic disease noted on inspection or maximum cytoreduction appears feasible, then we will plan to convert to open.  This would involve removal of her remaining tube and ovary (patient has previously undergone unilateral salpingo-oophorectomy at the time of her total hysterectomy), omentectomy, possible lymphadenectomy, and tumor debulking.  We reviewed the plan for a diagnostic laparoscopy, possible biopsies, possible open unilateral salpingo-oophorectomy with tumor debulking. The risks of surgery were discussed in detail and she understands these to include infection; wound separation; hernia; vaginal cuff separation, injury to adjacent organs such as bowel, bladder, blood vessels, ureters and nerves; bleeding which may require blood transfusion; anesthesia risk; thromboembolic events; possible death; unforeseen complications; possible need for re-exploration; medical complications such as heart attack, stroke, pleural effusion and pneumonia;  and, if full lymphadenectomy is performed the risk of lymphedema and lymphocyst. The patient will receive DVT and antibiotic prophylaxis as indicated. She voiced a clear understanding. She had the opportunity to ask questions. Perioperative instructions were reviewed with her. Prescriptions for post-op medications were sent to her pharmacy of choice.  In the setting of open debulking surgery, we discussed increased risk of VTE.  Plan would be for 4 weeks of prophylactic anticoagulation.  I spoke with the patient's daughter on the phone to relay plan for surgery next week.  32 minutes of total time was spent for this patient encounter, including preparation, face-to-face counseling with the patient and coordination of care, and documentation of the encounter.  Jeral Pinch, MD  Division of Gynecologic Oncology  Department of Obstetrics and Gynecology  [facility]

## 2022-03-23 NOTE — Patient Instructions (Addendum)
Preparing for your Surgery  Plan for surgery on probable March 30, 2022 with Dr. Jeral Pinch at [place] will be scheduled for diagnostic laparoscopy (looking into the abdomen with a camera through small incisions), possible biopsies, possible open bilateral salpingo-oophorectomy (removal of both ovaries and fallopian tubes), possible tumor debulking.   We will contact you later today to confirm the above date for surgery.  Pre-operative Testing -You will receive a phone call from presurgical testing at [facility] to arrange for a pre-operative appointment and lab work.  -Bring your insurance card, copy of an advanced directive if applicable, medication list  -At that visit, you will be asked to sign a consent for a possible blood transfusion in case a transfusion becomes necessary during surgery.  The need for a blood transfusion is rare but having consent is a necessary part of your care.     -You are fine to keep taking baby aspirin before surgery with your last dose being the day before surgery in the am.  -Do not take supplements such as fish oil (omega 3), red yeast rice, turmeric before your surgery. You want to avoid medications with aspirin in them including headache powders such as BC or Goody's), Excedrin migraine.  Day Before Surgery at [place] will be asked to take in a light diet the day before surgery. You will be advised you can have clear liquids up until 3 hours before your surgery.    Eat a light diet the day before surgery.  Examples including soups, broths, toast, yogurt, mashed potatoes.  AVOID GAS PRODUCING FOODS. Things to avoid include carbonated beverages (fizzy beverages, sodas), raw fruits and raw vegetables (uncooked), or beans.   If your bowels are filled with gas, your surgeon will have difficulty visualizing your pelvic organs which increases your surgical risks.  Your role in recovery Your role is to become active as soon  as directed by your doctor, while still giving yourself time to heal.  Rest when you feel tired. You will be asked to do the following in order to speed your recovery:  - Cough and breathe deeply. This helps to clear and expand your lungs and can prevent pneumonia after surgery.  - [place]. Do mild physical activity. Walking or moving your legs help your circulation and body functions return to normal. Do not try to get up or walk alone the first time after surgery.   -If you develop swelling on one leg or the other, pain in the back of your leg, redness/warmth in one of your legs, please call the office or go to the Emergency Room to have a doppler to rule out a blood clot. For shortness of breath, chest pain-seek care in the Emergency Room as soon as possible. - Actively manage your pain. Managing your pain lets you move in comfort. We will ask you to rate your pain on a scale of zero to 10. It is your responsibility to tell your doctor or nurse where and how much you hurt so your pain can be treated.  Special Considerations -If you are diabetic, you may be placed on insulin after surgery to have closer control over your blood sugars to promote healing and recovery.  This does not mean that you will be discharged on insulin.  If applicable, your oral antidiabetics will be resumed when you are tolerating a solid diet.  -Your final pathology results from surgery should be available around one  week after surgery and the results will be relayed to you when available.  -FMLA forms can be faxed to [phone] and please allow 5-7 business days for completion.  Pain Management After Surgery -You have been prescribed your pain medication and bowel regimen medications before surgery so that you can have these available when you are discharged from the hospital. The pain medication is for use ONLY AFTER surgery and a new prescription will not be given.   -Make sure that you have  Tylenol IF YOU ARE ABLE TO TAKE THESE MEDICATION at home to use on a regular basis after surgery for pain control.   -Review the attached handout on narcotic use and their risks and side effects.   Bowel Regimen -You have been prescribed Sennakot-S to take nightly to prevent constipation especially if you are taking the narcotic pain medication intermittently.  It is important to prevent constipation and drink adequate amounts of liquids. You can stop taking this medication when you are not taking pain medication and you are back on your normal bowel routine.  Risks of Surgery Risks of surgery are low but include bleeding, infection, damage to surrounding structures, re-operation, blood clots, and very rarely death.   Blood Transfusion Information (For the consent to be signed before surgery)  We will be checking your blood type before surgery so in case of emergencies, we will know what type of blood you would need.                                            WHAT IS A BLOOD TRANSFUSION?  A transfusion is the replacement of blood or some of its parts. Blood is made up of multiple cells which provide different functions. Red blood cells carry oxygen and are used for blood loss replacement. White blood cells fight against infection. Platelets control bleeding. Plasma helps clot blood. Other blood products are available for specialized needs, such as hemophilia or other clotting disorders. BEFORE THE TRANSFUSION  Who gives blood for transfusions?  You may be able to donate blood to be used at a later date on yourself (autologous donation). Relatives can be asked to donate blood. This is generally not any safer than if you have received blood from a stranger. The same precautions are taken to ensure safety when a relative's blood is donated. Healthy volunteers who are fully evaluated to make sure their blood is safe. This is blood bank blood. Transfusion therapy is the safest it has ever been  in the practice of medicine. Before blood is taken from a donor, a complete history is taken to make sure that person has no history of diseases nor engages in risky social behavior (examples are intravenous drug use or sexual activity with multiple partners). The donor's travel history is screened to minimize risk of transmitting infections, such as malaria. The donated blood is tested for signs of infectious diseases, such as HIV and hepatitis. The blood is then tested to be sure it is compatible with you in order to minimize the chance of a transfusion reaction. If you or a relative donates blood, this is often done in anticipation of surgery and is not appropriate for emergency situations. It takes many days to process the donated blood. RISKS AND COMPLICATIONS Although transfusion therapy is very safe and saves many lives, the main dangers of transfusion include:  Getting an infectious  disease. Developing a transfusion reaction. This is an allergic reaction to something in the blood you were given. Every precaution is taken to prevent this. The decision to have a blood transfusion has been considered carefully by your caregiver before blood is given. Blood is not given unless the benefits outweigh the risks.  AFTER SURGERY INSTRUCTIONS  Return to work: 4-6 weeks if applicable  You will be recommended to have 4 weeks of post-operative blood thinner to prevent blood clots if you have the open debulking surgery. This can be in pill form or injections.  You may have a white honeycomb dressing over your larger incision. This dressing can be removed 5 days after surgery and you do not need to reapply a new dressing. Once you remove the dressing, you will notice that you have the surgical glue (dermabond) on the incision and this will peel off on its own. You can get this dressing wet in the shower the days after surgery prior to removal on the 5th day.  Activity: 1. Be up and out of the bed during the  day.  Take a nap if needed.  You may walk up steps but be careful and use the hand rail.  Stair climbing will tire you more than you think, you may need to stop part way and rest.   2. No lifting or straining for 6 weeks over 10 pounds. No pushing, pulling, straining for 6 weeks.  3. No driving for around 8-56 days.  Do not drive if you are taking narcotic pain medicine and make sure that your reaction time has returned.   4. You can shower as soon as the next day after surgery. Shower daily.  Use your regular soap and water (not directly on the incision) and pat your incision(s) dry afterwards; don't rub.  No tub baths or submerging your body in water until cleared by your surgeon. If you have the soap that was given to you by pre-surgical testing that was used before surgery, you do not need to use it afterwards because this can irritate your incisions.   5. No sexual activity and nothing in the vagina for 4 weeks.  6. You may experience a small amount of clear drainage from your incisions, which is normal.  If the drainage persists, increases, or changes color please call the office.  7. Do not use creams, lotions, or ointments such as neosporin on your incisions after surgery until advised by your surgeon because they can cause removal of the dermabond glue on your incisions.    8. You may experience vaginal spotting after surgery or around the 6-8 week mark from surgery when the stitches at the top of the vagina begin to dissolve.  The spotting is normal but if you experience heavy bleeding, call our office.  9. Take Tylenol first for pain if you are able to take these medications and only use narcotic pain medication for severe pain not relieved by the Tylenol.  Monitor your Tylenol intake to a max of 4,000 mg in a 24 hour period.   Diet: 1. Low sodium Heart Healthy Diet is recommended but you are cleared to resume your normal (before surgery) diet after your procedure.  2. It is safe to  use a laxative, such as Miralax or Colace, if you have difficulty moving your bowels. You have been prescribed Sennakot-S to take at bedtime every evening after surgery to keep bowel movements regular and to prevent constipation.    Wound Care:  1. Keep clean and dry.  Shower daily.  Reasons to call the Doctor: Fever - Oral temperature greater than 100.4 degrees Fahrenheit Foul-smelling vaginal discharge Difficulty urinating Nausea and vomiting Increased pain at the site of the incision that is unrelieved with pain medicine. Difficulty breathing with or without chest pain New calf pain especially if only on one side Sudden, continuing increased vaginal bleeding with or without clots.   Contacts: For questions or concerns you should contact:  Dr. Jeral Pinch at [phone]  Joylene John, NP at [phone]  After Hours: call [phone] and have the GYN Oncologist paged/contacted (after 5 pm or on the weekends).  Messages sent via mychart are for non-urgent matters and are not responded to after hours so for urgent needs, please call the after hours number.

## 2022-03-23 NOTE — Telephone Encounter (Signed)
Spoke with the patient and gave the appt for genetics on 1/18 at 10 am. Patient asked "if I have to start Xarelto after surgery what strength would I take and for how long." Per Melissa APP patient will take 10 mgs once a day for 4 weeks. Patient verbalized understanding

## 2022-03-23 NOTE — Progress Notes (Signed)
Gynecologic Oncology Return Clinic Visit  03/23/22  Reason for Visit: treatment planning  Treatment History: Oncology History  Carcinomatosis ([place])  03/18/2022 Initial Diagnosis   Carcinomatosis ([place])   03/26/2022 - 03/26/2022 Chemotherapy   Patient is on Treatment Plan : OVARIAN Carboplatin (AUC 6) + Paclitaxel (175) q21d X 6 Cycles      CT A/P on 03/16/22: 1. Large, mixed solid and cystic mass in the low pelvis, difficult to clearly delineate from adjacent ascites although at least 10.2 x 10.0 cm. This is highly concerning for primary ovarian malignancy and most likely arises from the right ovary, although may involve both ovaries. This may be further evaluated by contrast enhanced MRI of the pelvis if desired. 2. Large volume ascites throughout the abdomen and pelvis, presumed malignant. Stranding and nodularity of the omentum and peritoneum, particularly in the ventral abdomen and left upper quadrant, highly suspicious for peritoneal metastatic disease. 3. Cirrhotic morphology of the liver, which may contribute to ascites. 4. Severely atrophic left kidney, consistent with remote prior infectious, obstructive, or ischemic insult. 5. Coronary artery disease.  Pelvic ultrasound 03/16/22: 1. Large mass in the right adnexa extending in the midline with heterogeneous attenuation and internal vascularity measuring 9.2 x 8.7 x 8.7 cm. Findings may represent an ovarian mass versus other etiology. MRI with and without contrast is recommended for further characterization and OBGYN consultation is recommended. 2. Left ovary is not visualized on exam. 3. Uterus is surgically absent. 4. Moderate amount of free fluid in the pelvis, compatible with known ascites.  MRI pelvis 03/17/22: 1. There is a large solid and cystic mass within the right adnexa which measures up to 9.5 cm in maximum dimension. Findings are concerning for a primary ovarian neoplasm. 2. Large volume of ascites within the abdomen  and pelvis. Given the presence of peritoneal nodules on the CT from the prior day findings are concerning for malignant ascites.  Paracentesis 11/8: No malignant cells identified Paracentesis 11/10: No malignant cells identified Omental biopsy 11/10: Benign mesothelial lined fibroadipose tissue, no carcinoma  Interval History: Doing well since discharge.  Continue to use the stool softener initially, has stopped this now.  Had some diarrhea this morning.  Appetite has improved some since second paracentesis but still not feeling very hungry.  Also has fatigue.  Is trying to eat smaller more frequent snacks and meals throughout the day.  Has occasional very mild nausea, no emesis.  Denies any urinary symptoms, has worked to increase her water intake.  Past Medical/Surgical History: Past Medical History:  Diagnosis Date   Breast cancer ([place]) 1980   RIGHT   Chronic kidney disease    has one kidney   DVT (deep venous thrombosis) ([facility])    on RIGHT leg-hx of   Hyperlipidemia    on meds   Hypertension    on meds   Osteopenia    PONV (postoperative nausea and vomiting)    Seasonal allergies    Thyroid disease    on meds    Past Surgical History:  Procedure Laterality Date   ABDOMINAL HYSTERECTOMY     APPENDECTOMY     BREAST BIOPSY     BREAST LUMPECTOMY WITH RADIOACTIVE SEED LOCALIZATION Left 12/01/2021   Procedure: LEFT BREAST RADIOACTIVE SEED GUIDED LUMPECTOMY;  Surgeon: Coralie Keens, MD;  Location: [facility];  Service: General;  Laterality: Left;  LMA   COLONOSCOPY  2011   Dr.Kaplan-normal   EYE SURGERY     76 years old  IR IMAGING GUIDED PORT INSERTION  03/19/2022   MASTECTOMY Right 1984   ROTATOR CUFF REPAIR     THYROID SURGERY Bilateral 1967   VARICOSE VEIN SURGERY     ablation   WISDOM TOOTH EXTRACTION      Family History  Problem Relation Age of Onset   Heart attack Mother    Alcohol abuse Mother    Brain cancer Mother    Lung cancer Mother     Breast cancer Maternal Aunt    Colon polyps Neg Hx    Colon cancer Neg Hx    Esophageal cancer Neg Hx    Stomach cancer Neg Hx    Rectal cancer Neg Hx     Social History   Socioeconomic History   Marital status: Single    Spouse name: Divorced    Number of children: 1   Years of education: Not on file   Highest education level: Not on file  Occupational History   Occupation: Retired    Comment: worked at [employer] in [employer]  Tobacco Use   Smoking status: Former    Packs/day: 0.25    Types: Cigarettes    Quit date: 05/11/1967    Years since quitting: 54.9   Smokeless tobacco: Never  Vaping Use   Vaping Use: Never used  Substance and Sexual Activity   Alcohol use: No   Drug use: No   Sexual activity: Not Currently    Birth control/protection: Post-menopausal, Surgical    Comment: hyst  Other Topics Concern   Not on file  Social History Narrative   Lives alone - very close with her neighbors   Social Determinants of Health   Financial Resource Strain: Low Risk  (08/26/2021)   Overall Financial Resource Strain (CARDIA)    Difficulty of Paying Living Expenses: Not hard at all  Food Insecurity: No Food Insecurity (03/17/2022)   Hunger Vital Sign    Worried About Running Out of Food in the Last Year: Never true    Ran Out of Food in the Last Year: Never true  Transportation Needs: No Transportation Needs (03/17/2022)   PRAPARE - Hydrologist (Medical): No    Lack of Transportation (Non-Medical): No  Physical Activity: Sufficiently Active (08/26/2021)   Exercise Vital Sign    Days of Exercise per Week: 7 days    Minutes of Exercise per Session: 30 min  Stress: No Stress Concern Present (08/26/2021)   [place]    Feeling of Stress : Not at all  Social Connections: Moderately Integrated (08/26/2021)   Social Connection and Isolation Panel  [NHANES]    Frequency of Communication with Friends and Family: More than three times a week    Frequency of Social Gatherings with Friends and Family: More than three times a week    Attends Religious Services: More than 4 times per year    Active Member of Clubs or Organizations: Yes    Attends Music therapist: More than 4 times per year    Marital Status: Divorced    Current Medications:  Current Outpatient Medications:    senna-docusate (SENOKOT-S) 8.6-50 MG tablet, Take 2 tablets by mouth at bedtime. For AFTER surgery, do not take if having diarrhea, Disp: 30 tablet, Rfl: 0   traMADol (ULTRAM) 50 MG tablet, Take 1 tablet (50 mg total) by mouth every 6 (six) hours as needed for severe pain. For AFTER surgery  only, do not take and drive, Disp: 10 tablet, Rfl: 0   acetaminophen (TYLENOL) 500 MG tablet, Take 500-1,000 mg by mouth daily as needed for moderate pain, fever or headache., Disp: , Rfl:    amLODipine (NORVASC) 5 MG tablet, Take 1 tablet (5 mg total) by mouth daily. (Patient taking differently: Take 5 mg by mouth in the morning.), Disp: 90 tablet, Rfl: 3   aspirin 81 MG EC tablet, Take 81 mg by mouth at bedtime., Disp: , Rfl:    atenolol (TENORMIN) 25 MG tablet, Take 1 tablet (25 mg total) by mouth daily. (Patient taking differently: Take 12.5 mg by mouth 2 (two) times daily.), Disp: 90 tablet, Rfl: 3   Calcium Carb-Cholecalciferol (CALCIUM + VITAMIN D3 PO), Take 1 tablet by mouth 2 (two) times daily., Disp: , Rfl:    levothyroxine (SYNTHROID) 100 MCG tablet, Take 1/2 tablet on Sundays and 1 tablet daily all other days (Patient taking differently: Take 100 mcg by mouth in the morning.), Disp: 90 tablet, Rfl: 3   loratadine (CLARITIN) 10 MG tablet, Take 10 mg by mouth at bedtime., Disp: , Rfl:    Multiple Vitamin (MULTIVITAMIN) tablet, Take 1 tablet by mouth in the morning., Disp: , Rfl:    pantoprazole (PROTONIX) 40 MG tablet, Take 1 tablet (40 mg total) by mouth daily.  X2-4 weeks (Patient not taking: Reported on 03/17/2022), Disp: 90 tablet, Rfl: 0   simvastatin (ZOCOR) 20 MG tablet, Take 1 tablet (20 mg total) by mouth at bedtime. Cancel rx for '10mg'$ ., Disp: 90 tablet, Rfl: 3  Review of Systems: + Decreased appetite, shortness of breath, bloating Denies fevers, chills, fatigue, unexplained weight changes. Denies hearing loss, neck lumps or masses, mouth sores, ringing in ears or voice changes. Denies cough or wheezing.   Denies chest pain or palpitations. Denies leg swelling. Denies abdominal pain, blood in stools, constipation, diarrhea, nausea, vomiting, or early satiety. Denies pain with intercourse, dysuria, frequency, hematuria or incontinence. Denies hot flashes, pelvic pain, vaginal bleeding or vaginal discharge.   Denies joint pain, back pain or muscle pain/cramps. Denies itching, rash, or wounds. Denies dizziness, headaches, numbness or seizures. Denies swollen lymph nodes or glands, denies easy bruising or bleeding. Denies anxiety, depression, confusion, or decreased concentration.  Physical Exam: BP (!) 141/76 (BP Location: Right Arm, Patient Position: Sitting)   Pulse 82   Temp (!) 97.5 F (36.4 C) (Oral)   Resp 16   Ht 5' 7.32" (1.71 m)   Wt 179 lb (81.2 kg)   SpO2 97%   BMI 27.77 kg/m  General: Alert, oriented, no acute distress. HEENT: Posterior oropharynx clear, sclera anicteric. Chest: Clear to auscultation bilaterally.  No wheezes or rhonchi. Cardiovascular: Regular rate and rhythm, no murmurs. Abdomen: soft, nontender, mildly distended.  Normoactive bowel sounds.  No masses or hepatosplenomegaly appreciated.  + fluid wave. Extremities: Grossly normal range of motion.  Warm, well perfused.  No edema bilaterally. Skin: No rashes or lesions noted. Lymphatics: No cervical, supraclavicular, or inguinal adenopathy. GU: Normal appearing external genitalia without erythema, excoriation, or lesions.  Speculum exam reveals atrophic  vaginal mucosa, no lesions noted.  Cuff intact, no nodularity or masses.  Bimanual exam reveals fluid within the cul-de-sac, no nodularity palpated, mass felt only with abdominal hand, sits somewhat out of the pelvis.  Rectovaginal exam confirms findings.  Laboratory & Radiologic Studies: None new  Assessment & Plan: April Weber is a 76 y.o. woman with imaging findings concerning for metastatic ovarian cancer with nondiagnostic  paracentesis and omental biopsy.  Discussed with patient most recent paracentesis and biopsy.  Unfortunately, neither 1 of these provided definitive diagnosis to allow [place] to proceed with neoadjuvant chemotherapy.  Thus, I have recommended that we proceed with surgery.  Plan will be to start out with diagnostic laparoscopy.  If findings at the time of surgery indicate inability to achieve maximal cytoreduction, we will plan to biopsy and sent for frozen section to assure that this will help [place] achieve a diagnosis.  If no significant metastatic disease noted on inspection or maximum cytoreduction appears feasible, then we will plan to convert to open.  This would involve removal of her remaining tube and ovary (patient has previously undergone unilateral salpingo-oophorectomy at the time of her total hysterectomy), omentectomy, possible lymphadenectomy, and tumor debulking.  We reviewed the plan for a diagnostic laparoscopy, possible biopsies, possible open unilateral salpingo-oophorectomy with tumor debulking. The risks of surgery were discussed in detail and she understands these to include infection; wound separation; hernia; vaginal cuff separation, injury to adjacent organs such as bowel, bladder, blood vessels, ureters and nerves; bleeding which may require blood transfusion; anesthesia risk; thromboembolic events; possible death; unforeseen complications; possible need for re-exploration; medical complications such as heart attack, stroke, pleural effusion and pneumonia;  and, if full lymphadenectomy is performed the risk of lymphedema and lymphocyst. The patient will receive DVT and antibiotic prophylaxis as indicated. She voiced a clear understanding. She had the opportunity to ask questions. Perioperative instructions were reviewed with her. Prescriptions for post-op medications were sent to her pharmacy of choice.  In the setting of open debulking surgery, we discussed increased risk of VTE.  Plan would be for 4 weeks of prophylactic anticoagulation.  I spoke with the patient's daughter on the phone to relay plan for surgery next week.  32 minutes of total time was spent for this patient encounter, including preparation, face-to-face counseling with the patient and coordination of care, and documentation of the encounter.  Jeral Pinch, MD  Division of Gynecologic Oncology  Department of Obstetrics and Gynecology  [facility]

## 2022-03-24 NOTE — Patient Instructions (Signed)
DUE TO COVID-19 ONLY TWO VISITORS  (aged 77 and older)  ARE ALLOWED TO COME WITH YOU AND STAY IN THE WAITING ROOM ONLY DURING PRE OP AND PROCEDURE.   **NO VISITORS ARE ALLOWED IN THE SHORT STAY AREA OR RECOVERY ROOM!!**  IF YOU WILL BE ADMITTED INTO THE HOSPITAL YOU ARE ALLOWED ONLY FOUR SUPPORT PEOPLE DURING VISITATION HOURS ONLY (7 AM -8PM)   The support person(s) must pass our screening, gel in and out, and wear a mask at all times, including in the patient's room. Patients must also wear a mask when staff or their support person are in the room. Visitors GUEST BADGE MUST BE WORN VISIBLY  One adult visitor may remain with you overnight and MUST be in the room by 8 P.M.     Your procedure is scheduled on: 03/30/22   Report to [facility] Main Entrance    Report to admitting at : 7:15 AM   Call this number if you have problems the morning of surgery [phone]  Eat a light diet the day before surgery.  Examples including soups, broths, toast, yogurt, mashed potatoes.  Things to avoid include carbonated beverages (fizzy beverages), raw fruits and raw vegetables, or beans.   If your bowels are filled with gas, your surgeon will have difficulty visualizing your pelvic organs which increases your surgical risks.  Do not eat food :After Midnight.   After Midnight you may have the following liquids until : 6:30 AM DAY OF SURGERY  Water Black Coffee (sugar ok, NO MILK/CREAM OR CREAMERS)  Tea (sugar ok, NO MILK/CREAM OR CREAMERS) regular and decaf                             Plain Jell-O (NO RED)                                           Fruit ices (not with fruit pulp, NO RED)                                     Popsicles (NO RED)                                                                  Juice: apple, WHITE grape, WHITE cranberry Sports drinks like Gatorade (NO RED)              Oral Hygiene is also important to reduce your risk of infection.                                     Remember - BRUSH YOUR TEETH THE MORNING OF SURGERY WITH YOUR REGULAR TOOTHPASTE   Do NOT smoke after Midnight   Take these medicines the morning of surgery with A SIP OF WATER: atenolol,amlodipine,levothyroxine  You may not have any metal on your body including hair pins, jewelry, and body piercing             Do not wear make-up, lotions, powders, perfumes/cologne, or deodorant  Do not wear nail polish including gel and S&S, artificial/acrylic nails, or any other type of covering on natural nails including finger and toenails. If you have artificial nails, gel coating, etc. that needs to be removed by a nail salon please have this removed prior to surgery or surgery may need to be canceled/ delayed if the surgeon/ anesthesia feels like they are unable to be safely monitored.   Do not shave  48 hours prior to surgery.    Do not bring valuables to the hospital. [place].   Contacts, dentures or bridgework may not be worn into surgery.   Bring small overnight bag day of surgery.   DO NOT [place]. PHARMACY WILL DISPENSE MEDICATIONS LISTED ON YOUR MEDICATION LIST TO YOU DURING YOUR ADMISSION [place]!    Patients discharged on the day of surgery will not be allowed to drive home.  Someone NEEDS to stay with you for the first 24 hours after anesthesia.   Special Instructions: Bring a copy of your healthcare power of attorney and living will documents         the day of surgery if you haven't scanned them before.              Please read over the following fact sheets you were given: IF YOU HAVE QUESTIONS ABOUT YOUR PRE-OP INSTRUCTIONS PLEASE CALL [phone]    [facility] - Preparing for Surgery Before surgery, you can play an important role.  Because skin is not sterile, your skin needs to be as free of germs as possible.  You can reduce the number of germs on  your skin by washing with CHG (chlorahexidine gluconate) soap before surgery.  CHG is an antiseptic cleaner which kills germs and bonds with the skin to continue killing germs even after washing. Please DO NOT use if you have an allergy to CHG or antibacterial soaps.  If your skin becomes reddened/irritated stop using the CHG and inform your nurse when you arrive at Short Stay. Do not shave (including legs and underarms) for at least 48 hours prior to the first CHG shower.  You may shave your face/neck. Please follow these instructions carefully:  1.  Shower with CHG Soap the night before surgery and the  morning of Surgery.  2.  If you choose to wash your hair, wash your hair first as usual with your  normal  shampoo.  3.  After you shampoo, rinse your hair and body thoroughly to remove the  shampoo.                           4.  Use CHG as you would any other liquid soap.  You can apply chg directly  to the skin and wash                       Gently with a scrungie or clean washcloth.  5.  Apply the CHG Soap to your body ONLY FROM THE NECK DOWN.   Do not use on face/ open  Wound or open sores. Avoid contact with eyes, ears mouth and genitals (private parts).                       Wash face,  Genitals (private parts) with your normal soap.             6.  Wash thoroughly, paying special attention to the area where your surgery  will be performed.  7.  Thoroughly rinse your body with warm water from the neck down.  8.  DO NOT shower/wash with your normal soap after using and rinsing off  the CHG Soap.                9.  Pat yourself dry with a clean towel.            10.  Wear clean pajamas.            11.  Place clean sheets on your bed the night of your first shower and do not  sleep with pets. Day of Surgery : Do not apply any lotions/deodorants the morning of surgery.  Please wear clean clothes to the hospital/surgery center.  FAILURE TO FOLLOW THESE INSTRUCTIONS MAY  RESULT IN THE CANCELLATION OF YOUR SURGERY PATIENT SIGNATURE_________________________________  NURSE SIGNATURE__________________________________  ________________________________________________________________________ WHAT IS A BLOOD TRANSFUSION? Blood Transfusion Information  A transfusion is the replacement of blood or some of its parts. Blood is made up of multiple cells which provide different functions. Red blood cells carry oxygen and are used for blood loss replacement. White blood cells fight against infection. Platelets control bleeding. Plasma helps clot blood. Other blood products are available for specialized needs, such as hemophilia or other clotting disorders. BEFORE THE TRANSFUSION  Who gives blood for transfusions?  Healthy volunteers who are fully evaluated to make sure their blood is safe. This is blood bank blood. Transfusion therapy is the safest it has ever been in the practice of medicine. Before blood is taken from a donor, a complete history is taken to make sure that person has no history of diseases nor engages in risky social behavior (examples are intravenous drug use or sexual activity with multiple partners). The donor's travel history is screened to minimize risk of transmitting infections, such as malaria. The donated blood is tested for signs of infectious diseases, such as HIV and hepatitis. The blood is then tested to be sure it is compatible with you in order to minimize the chance of a transfusion reaction. If you or a relative donates blood, this is often done in anticipation of surgery and is not appropriate for emergency situations. It takes many days to process the donated blood. RISKS AND COMPLICATIONS Although transfusion therapy is very safe and saves many lives, the main dangers of transfusion include:  Getting an infectious disease. Developing a transfusion reaction. This is an allergic reaction to something in the blood you were given. Every  precaution is taken to prevent this. The decision to have a blood transfusion has been considered carefully by your caregiver before blood is given. Blood is not given unless the benefits outweigh the risks. AFTER THE TRANSFUSION Right after receiving a blood transfusion, you will usually feel much better and more energetic. This is especially true if your red blood cells have gotten low (anemic). The transfusion raises the level of the red blood cells which carry oxygen, and this usually causes an energy increase. The nurse administering the transfusion will monitor you carefully for complications.  HOME CARE INSTRUCTIONS  No special instructions are needed after a transfusion. You may find your energy is better. Speak with your caregiver about any limitations on activity for underlying diseases you may have. SEEK MEDICAL CARE IF:  Your condition is not improving after your transfusion. You develop redness or irritation at the intravenous (IV) site. SEEK IMMEDIATE MEDICAL CARE IF:  Any of the following symptoms occur over the next 12 hours: Shaking chills. You have a temperature by mouth above 102 F (38.9 C), not controlled by medicine. Chest, back, or muscle pain. People around you feel you are not acting correctly or are confused. Shortness of breath or difficulty breathing. Dizziness and fainting. You get a rash or develop hives. You have a decrease in urine output. Your urine turns a dark color or changes to pink, red, or brown. Any of the following symptoms occur over the next 10 days: You have a temperature by mouth above 102 F (38.9 C), not controlled by medicine. Shortness of breath. Weakness after normal activity. The white part of the eye turns yellow (jaundice). You have a decrease in the amount of urine or are urinating less often. Your urine turns a dark color or changes to pink, red, or brown. Document Released: 04/23/2000 Document Revised: 07/19/2011 Document Reviewed:  12/11/2007 [facility] Patient Information 2014 [place], [place].  _______________________________________________________________________

## 2022-03-25 ENCOUNTER — Ambulatory Visit (INDEPENDENT_AMBULATORY_CARE_PROVIDER_SITE_OTHER): Payer: Medicare Other | Admitting: Family

## 2022-03-25 ENCOUNTER — Encounter (HOSPITAL_COMMUNITY): Payer: Self-pay

## 2022-03-25 ENCOUNTER — Telehealth: Payer: Self-pay

## 2022-03-25 ENCOUNTER — Encounter: Payer: Self-pay | Admitting: Family

## 2022-03-25 ENCOUNTER — Other Ambulatory Visit: Payer: Self-pay

## 2022-03-25 ENCOUNTER — Encounter (HOSPITAL_COMMUNITY)
Admission: RE | Admit: 2022-03-25 | Discharge: 2022-03-25 | Disposition: A | Discharge status: Home / Self Care | Payer: Medicare Other | Source: Ambulatory Visit | Attending: Gynecologic Oncology | Admitting: Gynecologic Oncology

## 2022-03-25 VITALS — BP 107/66 | HR 70 | Temp 97.6°F | Ht 67.0 in | Wt 179.0 lb

## 2022-03-25 VITALS — BP 113/74 | HR 86 | Temp 97.4°F | Ht 67.0 in | Wt 180.0 lb

## 2022-03-25 DIAGNOSIS — R180 Malignant ascites: Secondary | ICD-10-CM | POA: Diagnosis not present

## 2022-03-25 DIAGNOSIS — N838 Other noninflammatory disorders of ovary, fallopian tube and broad ligament: Secondary | ICD-10-CM

## 2022-03-25 DIAGNOSIS — I10 Essential (primary) hypertension: Secondary | ICD-10-CM

## 2022-03-25 DIAGNOSIS — Z01818 Encounter for other preprocedural examination: Secondary | ICD-10-CM | POA: Diagnosis not present

## 2022-03-25 DIAGNOSIS — Z853 Personal history of malignant neoplasm of breast: Secondary | ICD-10-CM

## 2022-03-25 DIAGNOSIS — R1900 Intra-abdominal and pelvic swelling, mass and lump, unspecified site: Secondary | ICD-10-CM

## 2022-03-25 DIAGNOSIS — C800 Disseminated malignant neoplasm, unspecified: Secondary | ICD-10-CM

## 2022-03-25 HISTORY — DX: Peripheral vascular disease, unspecified: I73.9

## 2022-03-25 HISTORY — DX: Dyspnea, unspecified: R06.00

## 2022-03-25 HISTORY — DX: Unspecified osteoarthritis, unspecified site: M19.90

## 2022-03-25 LAB — COMPREHENSIVE METABOLIC PANEL
ALT: 20 U/L (ref 0–44)
AST: 24 U/L (ref 15–41)
Albumin: 2.9 g/dL — ABNORMAL LOW (ref 3.5–5.0)
Alkaline Phosphatase: 57 U/L (ref 38–126)
Anion gap: 8 (ref 5–15)
BUN: 32 mg/dL — ABNORMAL HIGH (ref 8–23)
CO2: 21 mmol/L — ABNORMAL LOW (ref 22–32)
Calcium: 9 mg/dL (ref 8.9–10.3)
Chloride: 103 mmol/L (ref 98–111)
Creatinine, Ser: 2.04 mg/dL — ABNORMAL HIGH (ref 0.44–1.00)
GFR, Estimated: 25 mL/min — ABNORMAL LOW (ref >60)
Glucose, Bld: 96 mg/dL (ref 70–99)
Potassium: 4.8 mmol/L (ref 3.5–5.1)
Sodium: 132 mmol/L — ABNORMAL LOW (ref 135–145)
Total Bilirubin: 0.7 mg/dL (ref 0.3–1.2)
Total Protein: 5.6 g/dL — ABNORMAL LOW (ref 6.5–8.1)

## 2022-03-25 LAB — CBC
HCT: 42.2 % (ref 36.0–46.0)
Hemoglobin: 14.2 g/dL (ref 12.0–15.0)
MCH: 30.5 pg (ref 26.0–34.0)
MCHC: 33.6 g/dL (ref 30.0–36.0)
MCV: 90.8 fL (ref 80.0–100.0)
Platelets: 357 10*3/uL (ref 150–400)
RBC: 4.65 MIL/uL (ref 3.87–5.11)
RDW: 13.1 % (ref 11.5–15.5)
WBC: 11.9 10*3/uL — ABNORMAL HIGH (ref 4.0–10.5)
nRBC: 0 % (ref 0.0–0.2)

## 2022-03-25 MED FILL — Fentanyl Citrate Preservative Free (PF) Inj 100 MCG/2ML: INTRAMUSCULAR | Qty: 1 | Status: AC

## 2022-03-25 NOTE — Progress Notes (Signed)
Subjective:    Patient ID: April Weber, female    DOB: [birth year], 76 y.o.   MRN: [id number]  Chief Complaint  Patient presents with   Hospitalization Follow-up   Pt presents to the office today for hospital follow up. She went to the ED 03/19/22 with abdominal distension and SOB. Was found to have ascites and a large right adnexal mass worrisome for ovarian cancer. She already had a follow up with Oncologists. She had two paracentesis with removal of 3.1 L and 3.4 L.  She has hx of breast cancer.   She had a CT scan, ". Large, mixed solid and cystic mass in the low pelvis, difficult to clearly delineate from adjacent ascites although at least 10.2 x 10.0 cm. This is highly concerning for primary ovarian malignancy and most likely arises from the right ovary, although may involve both ovaries. This may be further evaluated by contrast enhanced MRI of the pelvis if desired. 2. Large volume ascites throughout the abdomen and pelvis, presumed malignant. Stranding and nodularity of the omentum and peritoneum, particularly in the ventral abdomen and left upper quadrant, highly suspicious for peritoneal metastatic disease. 3. Cirrhotic morphology of the liver, which may contribute to ascites. 4. Severely atrophic left kidney, consistent with remote prior infectious, obstructive, or ischemic insult. 5. Coronary artery disease."  Pelvic ultrasound 03/16/22: 1. Large mass in the right adnexa extending in the midline with heterogeneous attenuation and internal vascularity measuring 9.2 x 8.7 x 8.7 cm. Findings may represent an ovarian mass versus other etiology. MRI with and without contrast is recommended for further characterization and OBGYN consultation is recommended. 2. Left ovary is not visualized on exam. 3. Uterus is surgically absent. 4. Moderate amount of free fluid in the pelvis, compatible with known ascites.   MRI pelvis 03/17/22: 1. There is a large solid and cystic mass within  the right adnexa which measures up to 9.5 cm in maximum dimension. Findings are concerning for a primary ovarian neoplasm. 2. Large volume of ascites within the abdomen and pelvis. Given the presence of peritoneal nodules on the CT from the prior day findings are concerning for malignant ascites.   Paracentesis 11/8: No malignant cells identified Paracentesis 11/10: No malignant cells identified Omental biopsy 11/10: Benign mesothelial lined fibroadipose tissue, no carcinoma  Scheduled for laparoscopy diagnostic biopsy surgery on 03/30/22 Abdominal Pain This is a new problem. The current episode started 1 to 4 weeks ago. The onset quality is sudden. The problem has been gradually worsening. The pain is moderate. The quality of the pain is aching and cramping. Associated symptoms include nausea and vomiting.      Review of Systems  Gastrointestinal:  Positive for abdominal pain, nausea and vomiting.  All other systems reviewed and are negative.      Objective:   Physical Exam Vitals reviewed.  Constitutional:      General: She is not in acute distress.    Appearance: She is well-developed.  HENT:     Head: Normocephalic and atraumatic.     Right Ear: Tympanic membrane normal.     Left Ear: Tympanic membrane normal.  Eyes:     Pupils: Pupils are equal, round, and reactive to light.  Neck:     Thyroid: No thyromegaly.  Cardiovascular:     Rate and Rhythm: Normal rate and regular rhythm.     Heart sounds: Normal heart sounds. No murmur heard. Pulmonary:     Effort: Pulmonary effort is normal. No respiratory distress.  Breath sounds: Normal breath sounds. No wheezing.  Abdominal:     General: Bowel sounds are normal. There is no distension.     Palpations: Abdomen is soft.     Tenderness: There is abdominal tenderness.  Musculoskeletal:        General: No tenderness. Normal range of motion.     Cervical back: Normal range of motion and neck supple.  Skin:    General:  Skin is warm and dry.  Neurological:     Mental Status: She is alert and oriented to person, place, and time.     Cranial Nerves: No cranial nerve deficit.     Motor: Weakness present.     Deep Tendon Reflexes: Reflexes are normal and symmetric.  Psychiatric:        Behavior: Behavior normal.        Thought Content: Thought content normal.        Judgment: Judgment normal.       BP 113/74   Pulse 86   Temp (!) 97.4 F (36.3 C) (Temporal)   Ht '5\' 7"'$  (1.702 m)   Wt 180 lb (81.6 kg)   SpO2 98%   BMI 28.19 kg/m      Assessment & Plan:  [name] comes in today with chief complaint of Hospitalization Follow-up   Diagnosis and orders addressed:  1. Ovarian mass  2. Malignant ascites  3. HX: breast cancer   Keep follow up with Oncologists and surgeron Do not want any pain medication or nausea medication at this time Hospital notes reviewed    Evelina Dun, FNP

## 2022-03-25 NOTE — Progress Notes (Signed)
For Short Stay: [place] appointment date:  Bowel Prep reminder:   For Anesthesia: PCP - DO: Adam Phenix Cardiologist -   Chest x-ray - 03/16/22 EKG -  Stress Test -  ECHO - 10/22/09 Cardiac Cath -  Pacemaker/ICD device last checked: Pacemaker orders received: Device Rep notified:  Spinal Cord Stimulator:  Sleep Study -  CPAP -   Fasting Blood Sugar -  Checks Blood Sugar _____ times a day Date and result of last Hgb A1c-  Last dose of GLP1 agonist-  GLP1 instructions:   Last dose of SGLT-2 inhibitors-  SGLT-2 instructions:   Blood Thinner Instructions: Aspirin Instructions: Will be held 2 days before surgery: Dr. Berline Lopes. Last Dose:  Activity level: Can go up a flight of stairs and activities of daily living without stopping and without chest pain and/or shortness of breath   Able to exercise without chest pain and/or shortness of breath   Unable to go up a flight of stairs without chest pain and/or shortness of breath     Anesthesia review: Hx: DVT,HTN,CKD III  Patient denies shortness of breath, fever, cough and chest pain at PAT appointment   Patient verbalized understanding of instructions that were given to them at the PAT appointment. Patient was also instructed that they will need to review over the PAT instructions again at home before surgery.

## 2022-03-25 NOTE — Patient Instructions (Signed)
Ovarian Cancer  Ovarian cancer is a cancerous (malignant) tumor on one or both ovaries. A tumor is a mass of cells or tissue. The ovaries are the parts of the female reproductive system that produce eggs and female hormones (estrogen and progesterone). Women have two ovaries. They are located on each side of the uterus. There are different types of ovarian cancer, depending on where the cancer develops. These include: Epithelial ovarian cancer. This develops along the outer lining of the ovary. Germ cell cancer. This develops within the egg-producing areas of the ovary. Stromal cell cancer. This develops within the hormone-producing areas of the ovary. What are the causes? The exact cause of ovarian cancer is not known. What increases the risk? You are more likely to develop this condition if you: Are age 33 or older. Have gone through menopause. Have a personal or family history of endometrial, colon, breast, or ovarian cancer. Have the genes associated with breast and ovarian cancer (BRCA1 or BRCA2) or other gene mutations related to inherited family cancer syndromes, such as Lynch syndrome. Have undergone some types of fertility treatment, including in vitro fertilization. Became pregnant for the first time at age 25 or older or have never been pregnant. Have had hormone replacement therapy. Are overweight or obese. Have tissues from the uterus growing outside of the uterus (endometriosis). What are the signs or symptoms? In the early stages, ovarian cancer often does not cause symptoms. As the cancer grows, symptoms may include: Bloating, swelling, a lump, or pain in the abdomen. Unexplained weight loss. Indigestion, increased gas, and constipation. Pain and pressure in your back or in the area between the hip bones (pelvis). Loss of appetite or feeling full more quickly when eating. Frequent urination or pressure on your bladder. Pain during sex. Fatigue. How is this  diagnosed? This condition may be diagnosed based on: Your medical history. A physical exam to check your ovaries, uterus, vulva, cervix, vagina, bladder, rectum, and fallopian tubes. Tests. These may include: An imaging test that uses sound waves to take pictures of your uterus, bladder, ovaries, and fallopian tubes (transvaginal ultrasound). For this test, a sound wave probe is inserted into your vagina. A biopsy. This is a test in which a tissue sample is taken from the ovary and looked at under a microscope. Paracentesis. This is a procedure to remove built-up fluid from the abdomen (ascites). The fluid is then examined under a microscope. Blood tests that may include genetic testing. You may also have other tests, including: X-rays of the colon and rectum. CT scan, PET scan, or MRI. Laparoscopy. This is a procedure in which a thin, lighted tube is inserted into a small incision in the lower abdomen to take images of your pelvic organs. Additional tests may be done to see whether the cancer has spread to other parts of the body (what stage it is). The stages of ovarian cancer include: Stage 1 (I) - The cancer is located in one or both ovaries and has not spread to other parts of the body, including nearby lymph nodes. Stage 2 (II) - The cancer has spread into nearby organs, such as the uterus, bladder, colon, or lining of the abdomen (peritoneal cavity). It has not spread to nearby lymph nodes or other parts of the body. Stage 3 (III) - The cancer has begun to spread to nearby lymph nodes or tissue near the lymph nodes, but not to other parts of the body. Stage 4 (IV) - The cancer has spread to  other parts of the body, such as the liver, bone, or lungs. How is this treated? Treatment for this condition depends on the type and stage of the cancer. Treatment may include: Surgery to remove one ovary and its fallopian tube (oophorectomy). This may be done to treat cancer in its early  stages. Surgery to remove the uterus, cervix, fallopian tubes, and ovaries (hysterectomy with bilateral salpingo-oophorectomy). Lymph nodes near the tumor, and some tissue and fluid from the abdomen, may also be removed and analyzed for cancer cells. This is done to treat advanced cancer. Chemotherapy. This uses medicines to kill the cancer cells. Chemotherapy may be used before or after surgery. Intravenous (IV) or intraperitoneal (IP) chemotherapy is given directly in the abdomen. Hormone therapy. This uses hormones or hormone-blocking medicines to fight cancer cells. Targeted therapy. This uses drugs to attack specific areas within cancer cells to kill them or stop them from growing. This does not affect normal cells. Immunotherapy or biotherapy. This uses medicines to help your immune system fight cancer cells. Follow these instructions at home: Medicines Take over-the-counter and prescription medicines only as told by your health care provider. Ask your health care provider if the medicine prescribed to you: Requires you to avoid driving or using machinery. Can cause constipation. You may need to take these actions to prevent or treat constipation: Drink enough fluid to keep your urine pale yellow. Take over-the-counter or prescription medicines. Eat foods that are high in fiber, such as beans, whole grains, and fresh fruits and vegetables. Limit foods that are high in fat and processed sugars, such as fried or sweet foods. Lifestyle Do not use any products that contain nicotine or tobacco, such as cigarettes, e-cigarettes, and chewing tobacco. If you need help quitting, ask your health care provider. Do moderate exercise regularly as told by your health care provider. Try to eat regular, healthy meals. Avoid or eat limited amounts of processed meats, sugary drinks, and highly processed foods. Some of your treatments might affect your appetite. If you are having problems with eating or your  appetite, ask to meet with a dietitian. Consider joining a support group with others who have cancer. A support group may help you learn about resources and help you manage your cancer. General instructions You may need to have regular blood tests and imaging tests to monitor your response to treatment. Use sunscreen (SPF 30 or higher) or wear clothes that protect you from the sun if receiving chemotherapy. Keep all follow-up visits. This is important. Where to find more information American Cancer Society: www.cancer.[place]: www.cancer.gov Contact a health care provider if you: Are not able to follow a prescribed treatment plan or take a medicine. Have any symptoms or changes that concern you. Have changes in your bowel or bladder habits. Get help right away if you have: A fever or chills. This is important. Serious side effects or an allergic reaction to a treatment or medicine. Increased pain, swelling, or bloating in your abdomen. New or sudden symptoms that do not go away. Summary Ovarian cancer is a cancerous (malignant)tumor that develops on one or both ovaries. Many people do not have symptoms in early stages. Later symptoms may include pain in the abdomen, back, or pelvis, loss of appetite, and swelling or bloating in the abdomen. You are more likely to develop ovarian cancer if you are age 47 or older or have the genes associated with breast and ovarian cancer (BRCA1 or BRCA2). Treatment depends on the type  and stage of the cancer. Often, treatment may include a combination of surgery, radiation, and medicines that kill cancer cells (chemotherapy or targeted therapy). Keep all follow-up visits. This is important. This information is not intended to replace advice given to you by your health care provider. Make sure you discuss any questions you have with your health care provider. Document Revised: 09/25/2019 Document Reviewed: 09/25/2019 Elsevier Patient  Education  [place].

## 2022-03-25 NOTE — Progress Notes (Signed)
Lab. Results: Creatinine: 2.04

## 2022-03-25 NOTE — Telephone Encounter (Signed)
Per Joylene John NP,  Could you let the patient know her sodium is slightly low. She did mention she follows a low sodium diet but please let her know not to be too restrictive with this. She could eat a bowl of chicken noodle soup or something to give her some salt. Also her kidney function is more elevated. She is aware she has this history and I think knows she void NSAIDs etc but please reinforce this. Want to make sure she is staying hydrated but not with just water since that will dilute her sodium even further. She may also want to try to drink protein shakes in between smaller meals during the day to help with her protein stores prior to having surgery. Her white blood cell count is slightly elevated, less than the previous value. If you could ask the usual questions to rule out infection before surgery, that would be great.    Pt is aware of the above message and suggestions. She did state she is not having any signs of infection/fever/cough/UTI. She states she keeps sinus issues with drainage, but she has it all of the time.  Joylene John NP notified.

## 2022-03-26 ENCOUNTER — Ambulatory Visit: Payer: Medicare Other

## 2022-03-29 ENCOUNTER — Telehealth: Payer: Self-pay

## 2022-03-29 NOTE — Telephone Encounter (Signed)
Telephone call to check on pre-operative status.  Patient compliant with pre-operative instructions.  Reinforced nothing to eat after midnight. Clear liquids until 0630 . Patient to arrive at [facility].  No questions or concerns voiced.  Instructed to call for any needs.

## 2022-03-30 ENCOUNTER — Inpatient Hospital Stay (HOSPITAL_COMMUNITY): Payer: Medicare Other | Admitting: Anesthesiology

## 2022-03-30 ENCOUNTER — Encounter (HOSPITAL_COMMUNITY)
Admission: RE | Disposition: A | Discharge status: Home / Self Care | Payer: Self-pay | Source: Home / Self Care | Attending: Gynecologic Oncology

## 2022-03-30 ENCOUNTER — Inpatient Hospital Stay (HOSPITAL_COMMUNITY)
Admission: RE | Admit: 2022-03-30 | Discharge: 2022-03-31 | DRG: 737 | Disposition: A | Discharge status: Home / Self Care | Payer: Medicare Other | Attending: Gynecologic Oncology | Admitting: Gynecologic Oncology

## 2022-03-30 ENCOUNTER — Other Ambulatory Visit: Payer: Self-pay

## 2022-03-30 ENCOUNTER — Telehealth: Payer: Self-pay | Admitting: *Deleted

## 2022-03-30 ENCOUNTER — Encounter (HOSPITAL_COMMUNITY): Payer: Self-pay | Admitting: Gynecologic Oncology

## 2022-03-30 DIAGNOSIS — E785 Hyperlipidemia, unspecified: Secondary | ICD-10-CM | POA: Diagnosis present

## 2022-03-30 DIAGNOSIS — Z635 Disruption of family by separation and divorce: Secondary | ICD-10-CM | POA: Diagnosis not present

## 2022-03-30 DIAGNOSIS — R188 Other ascites: Secondary | ICD-10-CM

## 2022-03-30 DIAGNOSIS — Z7982 Long term (current) use of aspirin: Secondary | ICD-10-CM | POA: Diagnosis not present

## 2022-03-30 DIAGNOSIS — Z885 Allergy status to narcotic agent status: Secondary | ICD-10-CM

## 2022-03-30 DIAGNOSIS — N736 Female pelvic peritoneal adhesions (postinfective): Secondary | ICD-10-CM | POA: Diagnosis present

## 2022-03-30 DIAGNOSIS — R1900 Intra-abdominal and pelvic swelling, mass and lump, unspecified site: Secondary | ICD-10-CM

## 2022-03-30 DIAGNOSIS — E079 Disorder of thyroid, unspecified: Secondary | ICD-10-CM | POA: Diagnosis present

## 2022-03-30 DIAGNOSIS — C561 Malignant neoplasm of right ovary: Principal | ICD-10-CM | POA: Diagnosis present

## 2022-03-30 DIAGNOSIS — N1832 Chronic kidney disease, stage 3b: Secondary | ICD-10-CM | POA: Diagnosis present

## 2022-03-30 DIAGNOSIS — Z79890 Hormone replacement therapy (postmenopausal): Secondary | ICD-10-CM

## 2022-03-30 DIAGNOSIS — Z90710 Acquired absence of both cervix and uterus: Secondary | ICD-10-CM

## 2022-03-30 DIAGNOSIS — M8580 Other specified disorders of bone density and structure, unspecified site: Secondary | ICD-10-CM | POA: Diagnosis present

## 2022-03-30 DIAGNOSIS — J302 Other seasonal allergic rhinitis: Secondary | ICD-10-CM | POA: Diagnosis present

## 2022-03-30 DIAGNOSIS — Z87891 Personal history of nicotine dependence: Secondary | ICD-10-CM

## 2022-03-30 DIAGNOSIS — Z9049 Acquired absence of other specified parts of digestive tract: Secondary | ICD-10-CM | POA: Diagnosis not present

## 2022-03-30 DIAGNOSIS — K5730 Diverticulosis of large intestine without perforation or abscess without bleeding: Secondary | ICD-10-CM | POA: Diagnosis present

## 2022-03-30 DIAGNOSIS — Z853 Personal history of malignant neoplasm of breast: Secondary | ICD-10-CM | POA: Diagnosis not present

## 2022-03-30 DIAGNOSIS — I10 Essential (primary) hypertension: Secondary | ICD-10-CM

## 2022-03-30 DIAGNOSIS — C800 Disseminated malignant neoplasm, unspecified: Secondary | ICD-10-CM

## 2022-03-30 DIAGNOSIS — C7982 Secondary malignant neoplasm of genital organs: Secondary | ICD-10-CM | POA: Diagnosis not present

## 2022-03-30 DIAGNOSIS — Z9011 Acquired absence of right breast and nipple: Secondary | ICD-10-CM

## 2022-03-30 DIAGNOSIS — Z886 Allergy status to analgesic agent status: Secondary | ICD-10-CM

## 2022-03-30 DIAGNOSIS — I129 Hypertensive chronic kidney disease with stage 1 through stage 4 chronic kidney disease, or unspecified chronic kidney disease: Secondary | ICD-10-CM | POA: Diagnosis present

## 2022-03-30 DIAGNOSIS — Z803 Family history of malignant neoplasm of breast: Secondary | ICD-10-CM | POA: Diagnosis not present

## 2022-03-30 DIAGNOSIS — R5383 Other fatigue: Secondary | ICD-10-CM | POA: Diagnosis present

## 2022-03-30 DIAGNOSIS — Z5331 Laparoscopic surgical procedure converted to open procedure: Secondary | ICD-10-CM

## 2022-03-30 DIAGNOSIS — Z79899 Other long term (current) drug therapy: Secondary | ICD-10-CM

## 2022-03-30 DIAGNOSIS — C569 Malignant neoplasm of unspecified ovary: Secondary | ICD-10-CM | POA: Diagnosis present

## 2022-03-30 DIAGNOSIS — Z91040 Latex allergy status: Secondary | ICD-10-CM

## 2022-03-30 DIAGNOSIS — R180 Malignant ascites: Secondary | ICD-10-CM | POA: Diagnosis present

## 2022-03-30 DIAGNOSIS — Z880 Allergy status to penicillin: Secondary | ICD-10-CM

## 2022-03-30 DIAGNOSIS — Z8249 Family history of ischemic heart disease and other diseases of the circulatory system: Secondary | ICD-10-CM | POA: Diagnosis not present

## 2022-03-30 DIAGNOSIS — Z881 Allergy status to other antibiotic agents status: Secondary | ICD-10-CM

## 2022-03-30 DIAGNOSIS — Z888 Allergy status to other drugs, medicaments and biological substances status: Secondary | ICD-10-CM

## 2022-03-30 HISTORY — PX: SALPINGOOPHORECTOMY: SHX82

## 2022-03-30 HISTORY — PX: DEBULKING: SHX6277

## 2022-03-30 HISTORY — PX: LAPAROSCOPY: SHX197

## 2022-03-30 LAB — TYPE AND SCREEN
ABO/RH(D): A POS
Antibody Screen: NEGATIVE

## 2022-03-30 LAB — BASIC METABOLIC PANEL
Anion gap: 7 (ref 5–15)
BUN: 20 mg/dL (ref 8–23)
CO2: 20 mmol/L — ABNORMAL LOW (ref 22–32)
Calcium: 8.1 mg/dL — ABNORMAL LOW (ref 8.9–10.3)
Chloride: 106 mmol/L (ref 98–111)
Creatinine, Ser: 1.55 mg/dL — ABNORMAL HIGH (ref 0.44–1.00)
GFR, Estimated: 34 mL/min — ABNORMAL LOW (ref >60)
Glucose, Bld: 98 mg/dL (ref 70–99)
Potassium: 4.4 mmol/L (ref 3.5–5.1)
Sodium: 133 mmol/L — ABNORMAL LOW (ref 135–145)

## 2022-03-30 LAB — ABO/RH: ABO/RH(D): A POS

## 2022-03-30 SURGERY — LAPAROSCOPY, DIAGNOSTIC
Anesthesia: General | Site: Abdomen

## 2022-03-30 MED ORDER — ROCURONIUM BROMIDE 10 MG/ML (PF) SYRINGE
PREFILLED_SYRINGE | INTRAVENOUS | Status: AC
  Filled 2022-03-30: qty 10

## 2022-03-30 MED ORDER — TRAMADOL HCL 50 MG PO TABS
100.0000 mg | ORAL_TABLET | Freq: Two times a day (BID) | ORAL | Status: DC | PRN
  Administered 2022-03-30: 100 mg via ORAL
  Filled 2022-03-30: qty 2

## 2022-03-30 MED ORDER — FENTANYL CITRATE (PF) 250 MCG/5ML IJ SOLN
INTRAMUSCULAR | Status: AC
  Filled 2022-03-30: qty 5

## 2022-03-30 MED ORDER — POVIDONE-IODINE 10 % EX SWAB: 2.0000 | Freq: Once | CUTANEOUS | Status: DC

## 2022-03-30 MED ORDER — PHENYLEPHRINE HCL (PRESSORS) 10 MG/ML IV SOLN
INTRAVENOUS | Status: DC | PRN
  Administered 2022-03-30: 80 ug via INTRAVENOUS
  Administered 2022-03-30: 160 ug via INTRAVENOUS

## 2022-03-30 MED ORDER — SUGAMMADEX SODIUM 200 MG/2ML IV SOLN
INTRAVENOUS | Status: DC | PRN
  Administered 2022-03-30: 200 mg via INTRAVENOUS

## 2022-03-30 MED ORDER — BUPIVACAINE LIPOSOME 1.3 % IJ SUSP
INTRAMUSCULAR | Status: AC
  Filled 2022-03-30: qty 20

## 2022-03-30 MED ORDER — LACTATED RINGERS IV SOLN: INTRAVENOUS | Status: DC

## 2022-03-30 MED ORDER — HYDROMORPHONE HCL 1 MG/ML IJ SOLN
INTRAMUSCULAR | Status: AC
  Filled 2022-03-30: qty 1

## 2022-03-30 MED ORDER — ATENOLOL 25 MG PO TABS
25.0000 mg | ORAL_TABLET | Freq: Every day | ORAL | Status: DC
  Administered 2022-03-31: 25 mg via ORAL
  Filled 2022-03-30: qty 1

## 2022-03-30 MED ORDER — CEFAZOLIN SODIUM-DEXTROSE 2-4 GM/100ML-% IV SOLN
INTRAVENOUS | Status: AC
  Filled 2022-03-30: qty 100

## 2022-03-30 MED ORDER — AMLODIPINE BESYLATE 5 MG PO TABS
5.0000 mg | ORAL_TABLET | Freq: Every day | ORAL | Status: DC
  Administered 2022-03-31: 5 mg via ORAL
  Filled 2022-03-30: qty 1

## 2022-03-30 MED ORDER — SIMVASTATIN 20 MG PO TABS
20.0000 mg | ORAL_TABLET | Freq: Every day | ORAL | Status: DC
  Administered 2022-03-30: 20 mg via ORAL
  Filled 2022-03-30: qty 1

## 2022-03-30 MED ORDER — ACETAMINOPHEN 500 MG PO TABS
1000.0000 mg | ORAL_TABLET | Freq: Two times a day (BID) | ORAL | Status: DC
  Administered 2022-03-30 – 2022-03-31 (×2): 1000 mg via ORAL
  Filled 2022-03-30 (×2): qty 2

## 2022-03-30 MED ORDER — HYDROMORPHONE HCL 1 MG/ML IJ SOLN
0.2500 mg | INTRAMUSCULAR | Status: DC | PRN
  Administered 2022-03-30: 0.25 mg via INTRAVENOUS
  Administered 2022-03-30 (×2): 0.5 mg via INTRAVENOUS
  Administered 2022-03-30: 0.25 mg via INTRAVENOUS

## 2022-03-30 MED ORDER — KCL IN DEXTROSE-NACL 20-5-0.45 MEQ/L-%-% IV SOLN
INTRAVENOUS | Status: DC
  Filled 2022-03-30 (×2): qty 1000

## 2022-03-30 MED ORDER — SENNOSIDES-DOCUSATE SODIUM 8.6-50 MG PO TABS
2.0000 | ORAL_TABLET | Freq: Every day | ORAL | Status: DC
  Administered 2022-03-30: 2 via ORAL
  Filled 2022-03-30: qty 2

## 2022-03-30 MED ORDER — ENOXAPARIN SODIUM 40 MG/0.4ML IJ SOSY
40.0000 mg | PREFILLED_SYRINGE | INTRAMUSCULAR | Status: DC
  Administered 2022-03-31: 40 mg via SUBCUTANEOUS
  Filled 2022-03-30: qty 0.4

## 2022-03-30 MED ORDER — BUPIVACAINE HCL 0.25 % IJ SOLN
INTRAMUSCULAR | Status: DC | PRN
  Administered 2022-03-30: 50 mL

## 2022-03-30 MED ORDER — DEXAMETHASONE SODIUM PHOSPHATE 10 MG/ML IJ SOLN
INTRAMUSCULAR | Status: DC | PRN
  Administered 2022-03-30: 10 mg via INTRAVENOUS

## 2022-03-30 MED ORDER — KETAMINE HCL 10 MG/ML IJ SOLN
INTRAMUSCULAR | Status: DC | PRN
  Administered 2022-03-30: 50 mg via INTRAVENOUS

## 2022-03-30 MED ORDER — HEPARIN SODIUM (PORCINE) 5000 UNIT/ML IJ SOLN
5000.0000 [IU] | INTRAMUSCULAR | Status: AC
  Administered 2022-03-30: 5000 [IU] via SUBCUTANEOUS
  Filled 2022-03-30: qty 1

## 2022-03-30 MED ORDER — DEXAMETHASONE SODIUM PHOSPHATE 4 MG/ML IJ SOLN: 4.0000 mg | INTRAMUSCULAR | Status: DC

## 2022-03-30 MED ORDER — OXYCODONE HCL 5 MG PO TABS
5.0000 mg | ORAL_TABLET | ORAL | Status: DC | PRN
  Administered 2022-03-30 – 2022-03-31 (×2): 5 mg via ORAL
  Filled 2022-03-30 (×2): qty 1

## 2022-03-30 MED ORDER — SODIUM CHLORIDE (PF) 0.9 % IJ SOLN
INTRAMUSCULAR | Status: AC
  Filled 2022-03-30: qty 10

## 2022-03-30 MED ORDER — CHLORHEXIDINE GLUCONATE 0.12 % MT SOLN
15.0000 mL | Freq: Once | OROMUCOSAL | Status: AC
  Administered 2022-03-30: 15 mL via OROMUCOSAL

## 2022-03-30 MED ORDER — GABAPENTIN 300 MG PO CAPS
300.0000 mg | ORAL_CAPSULE | Freq: Once | ORAL | Status: AC
  Administered 2022-03-30: 300 mg via ORAL
  Filled 2022-03-30: qty 1

## 2022-03-30 MED ORDER — CEFAZOLIN SODIUM-DEXTROSE 2-3 GM-%(50ML) IV SOLR
INTRAVENOUS | Status: DC | PRN
  Administered 2022-03-30: 2 g via INTRAVENOUS

## 2022-03-30 MED ORDER — DROPERIDOL 2.5 MG/ML IJ SOLN: 0.6250 mg | Freq: Once | INTRAMUSCULAR | Status: DC | PRN

## 2022-03-30 MED ORDER — PROPOFOL 10 MG/ML IV BOLUS
INTRAVENOUS | Status: AC
  Filled 2022-03-30: qty 20

## 2022-03-30 MED ORDER — PHENYLEPHRINE HCL-NACL 20-0.9 MG/250ML-% IV SOLN
INTRAVENOUS | Status: DC | PRN
  Administered 2022-03-30: 50 ug/min via INTRAVENOUS

## 2022-03-30 MED ORDER — ENSURE ENLIVE PO LIQD
237.0000 mL | Freq: Two times a day (BID) | ORAL | Status: DC
  Administered 2022-03-31: 237 mL via ORAL

## 2022-03-30 MED ORDER — SURGIFLO WITH THROMBIN (HEMOSTATIC MATRIX KIT) OPTIME
TOPICAL | Status: DC | PRN
  Administered 2022-03-30: 1 via TOPICAL

## 2022-03-30 MED ORDER — LIDOCAINE 2% (20 MG/ML) 5 ML SYRINGE
INTRAMUSCULAR | Status: DC | PRN
  Administered 2022-03-30: 60 mg via INTRAVENOUS

## 2022-03-30 MED ORDER — ROCURONIUM BROMIDE 10 MG/ML (PF) SYRINGE
PREFILLED_SYRINGE | INTRAVENOUS | Status: DC | PRN
  Administered 2022-03-30: 5 mg via INTRAVENOUS
  Administered 2022-03-30: 60 mg via INTRAVENOUS

## 2022-03-30 MED ORDER — LIDOCAINE HCL (PF) 2 % IJ SOLN
INTRAMUSCULAR | Status: AC
  Filled 2022-03-30: qty 5

## 2022-03-30 MED ORDER — BUPIVACAINE HCL 0.25 % IJ SOLN
INTRAMUSCULAR | Status: AC
  Filled 2022-03-30: qty 1

## 2022-03-30 MED ORDER — ONDANSETRON HCL 4 MG/2ML IJ SOLN
INTRAMUSCULAR | Status: AC
  Filled 2022-03-30: qty 2

## 2022-03-30 MED ORDER — PROPOFOL 10 MG/ML IV BOLUS
INTRAVENOUS | Status: DC | PRN
  Administered 2022-03-30: 140 mg via INTRAVENOUS

## 2022-03-30 MED ORDER — PHENYLEPHRINE 80 MCG/ML (10ML) SYRINGE FOR IV PUSH (FOR BLOOD PRESSURE SUPPORT)
PREFILLED_SYRINGE | INTRAVENOUS | Status: AC
  Filled 2022-03-30: qty 10

## 2022-03-30 MED ORDER — CHEWING GUM (ORBIT) SUGAR FREE
1.0000 | CHEWING_GUM | Freq: Three times a day (TID) | ORAL | Status: DC
  Administered 2022-03-30 – 2022-03-31 (×3): 1 via ORAL
  Filled 2022-03-30: qty 1

## 2022-03-30 MED ORDER — NON FORMULARY: 1.0000 [IU] | Freq: Three times a day (TID) | Status: DC

## 2022-03-30 MED ORDER — ONDANSETRON HCL 4 MG/2ML IJ SOLN
INTRAMUSCULAR | Status: DC | PRN
  Administered 2022-03-30: 4 mg via INTRAVENOUS

## 2022-03-30 MED ORDER — ENOXAPARIN (LOVENOX) PATIENT EDUCATION KIT
PACK | Freq: Once | Status: AC
  Filled 2022-03-30: qty 1

## 2022-03-30 MED ORDER — ONDANSETRON HCL 4 MG PO TABS: 4.0000 mg | ORAL_TABLET | Freq: Four times a day (QID) | ORAL | Status: DC | PRN

## 2022-03-30 MED ORDER — ONDANSETRON HCL 4 MG/2ML IJ SOLN: 4.0000 mg | Freq: Four times a day (QID) | INTRAMUSCULAR | Status: DC | PRN

## 2022-03-30 MED ORDER — FENTANYL CITRATE (PF) 250 MCG/5ML IJ SOLN
INTRAMUSCULAR | Status: DC | PRN
  Administered 2022-03-30 (×5): 50 ug via INTRAVENOUS

## 2022-03-30 MED ORDER — LACTATED RINGERS IV SOLN: INTRAVENOUS | Status: DC | PRN

## 2022-03-30 MED ORDER — ORAL CARE MOUTH RINSE: 15.0000 mL | Freq: Once | OROMUCOSAL | Status: AC

## 2022-03-30 MED ORDER — HYDROMORPHONE HCL 1 MG/ML IJ SOLN
INTRAMUSCULAR | Status: AC
  Administered 2022-03-30: 0.5 mg via INTRAVENOUS
  Filled 2022-03-30: qty 1

## 2022-03-30 MED ORDER — LEVOTHYROXINE SODIUM 100 MCG PO TABS
100.0000 ug | ORAL_TABLET | Freq: Every day | ORAL | Status: DC
  Administered 2022-03-31: 100 ug via ORAL
  Filled 2022-03-30: qty 1

## 2022-03-30 MED ORDER — PREGABALIN 25 MG PO CAPS
25.0000 mg | ORAL_CAPSULE | Freq: Two times a day (BID) | ORAL | Status: DC
  Administered 2022-03-31: 25 mg via ORAL
  Filled 2022-03-30: qty 1

## 2022-03-30 MED ORDER — KETAMINE HCL 50 MG/5ML IJ SOSY
PREFILLED_SYRINGE | INTRAMUSCULAR | Status: AC
  Filled 2022-03-30: qty 5

## 2022-03-30 MED ORDER — BUPIVACAINE LIPOSOME 1.3 % IJ SUSP
INTRAMUSCULAR | Status: DC | PRN
  Administered 2022-03-30: 20 mL

## 2022-03-30 MED ORDER — ACETAMINOPHEN 500 MG PO TABS
1000.0000 mg | ORAL_TABLET | ORAL | Status: AC
  Administered 2022-03-30: 1000 mg via ORAL
  Filled 2022-03-30: qty 2

## 2022-03-30 MED ORDER — 0.9 % SODIUM CHLORIDE (POUR BTL) OPTIME
TOPICAL | Status: DC | PRN
  Administered 2022-03-30: 2000 mL

## 2022-03-30 MED ORDER — DEXAMETHASONE SODIUM PHOSPHATE 10 MG/ML IJ SOLN
INTRAMUSCULAR | Status: AC
  Filled 2022-03-30: qty 1

## 2022-03-30 SURGICAL SUPPLY — 85 items
ADH SKN CLS APL DERMABOND .7 (GAUZE/BANDAGES/DRESSINGS) ×4
AGENT HMST KT MTR STRL THRMB (HEMOSTASIS)
APL PRP STRL LF DISP 70% ISPRP (MISCELLANEOUS) ×2
ATTRACTOMAT 16X20 MAGNETIC DRP (DRAPES) IMPLANT
BAG COUNTER SPONGE SURGICOUNT (BAG) IMPLANT
BAG SPNG CNTER NS LX DISP (BAG)
BLADE EXTENDED COATED 6.5IN (ELECTRODE) ×3 IMPLANT
CABLE HIGH FREQUENCY MONO STRZ (ELECTRODE) IMPLANT
CELLS DAT CNTRL 66122 CELL SVR (MISCELLANEOUS) IMPLANT
CHLORAPREP W/TINT 26 (MISCELLANEOUS) ×3 IMPLANT
CLIP TI LARGE 6 (CLIP) ×3 IMPLANT
CLIP TI MEDIUM 6 (CLIP) ×3 IMPLANT
CLIP TI MEDIUM LARGE 6 (CLIP) ×3 IMPLANT
CNTNR URN SCR LID CUP LEK RST (MISCELLANEOUS) IMPLANT
CONT SPEC 4OZ STRL OR WHT (MISCELLANEOUS)
COVER SURGICAL LIGHT HANDLE (MISCELLANEOUS) ×3 IMPLANT
DERMABOND ADVANCED .7 DNX12 (GAUZE/BANDAGES/DRESSINGS) ×3 IMPLANT
DRAPE SURG IRRIG POUCH 19X23 (DRAPES) ×3 IMPLANT
DRAPE WARM FLUID 44X44 (DRAPES) ×3 IMPLANT
DRSG OPSITE POSTOP 4X10 (GAUZE/BANDAGES/DRESSINGS) IMPLANT
DRSG OPSITE POSTOP 4X6 (GAUZE/BANDAGES/DRESSINGS) IMPLANT
DRSG OPSITE POSTOP 4X8 (GAUZE/BANDAGES/DRESSINGS) IMPLANT
ELECT PENCIL ROCKER SW 15FT (MISCELLANEOUS) IMPLANT
ELECT REM PT RETURN 15FT ADLT (MISCELLANEOUS) ×3 IMPLANT
GAUZE 4X4 16PLY ~~LOC~~+RFID DBL (SPONGE) ×3 IMPLANT
GLOVE BIO SURGEON STRL SZ 6 (GLOVE) ×6 IMPLANT
GLOVE BIO SURGEON STRL SZ 6.5 (GLOVE) ×6 IMPLANT
GOWN STRL REUS W/ TWL LRG LVL3 (GOWN DISPOSABLE) ×6 IMPLANT
GOWN STRL REUS W/TWL LRG LVL3 (GOWN DISPOSABLE) ×4
HANDLE SUCTION POOLE (INSTRUMENTS) IMPLANT
HEMOSTAT ARISTA ABSORB 3G PWDR (HEMOSTASIS) IMPLANT
HOLDER FOLEY CATH W/STRAP (MISCELLANEOUS) IMPLANT
IRRIG SUCT STRYKERFLOW 2 WTIP (MISCELLANEOUS)
IRRIGATION SUCT STRKRFLW 2 WTP (MISCELLANEOUS) IMPLANT
KIT BASIN OR (CUSTOM PROCEDURE TRAY) ×3 IMPLANT
KIT TURNOVER KIT A (KITS) IMPLANT
LIGASURE IMPACT 36 18CM CVD LR (INSTRUMENTS) IMPLANT
LOOP VESSEL MAXI BLUE (MISCELLANEOUS) IMPLANT
MANIPULATOR UTERINE 4.5 ZUMI (MISCELLANEOUS) IMPLANT
NDL HYPO 21X1.5 SAFETY (NEEDLE) ×6 IMPLANT
NEEDLE HYPO 21X1.5 SAFETY (NEEDLE) ×4 IMPLANT
NS IRRIG 1000ML POUR BTL (IV SOLUTION) ×6 IMPLANT
PACK GENERAL/GYN (CUSTOM PROCEDURE TRAY) ×3 IMPLANT
PAD POSITIONING PINK XL (MISCELLANEOUS) ×3 IMPLANT
RETRACTOR WND ALEXIS 18 MED (MISCELLANEOUS) IMPLANT
RETRACTOR WND ALEXIS 25 LRG (MISCELLANEOUS) IMPLANT
RTRCTR WOUND ALEXIS 18CM MED (MISCELLANEOUS)
RTRCTR WOUND ALEXIS 18CM SML (INSTRUMENTS) ×2
RTRCTR WOUND ALEXIS 25CM LRG (MISCELLANEOUS) ×2
SAVER CELL AAL HAEMONETICS (INSTRUMENTS) IMPLANT
SCISSORS LAP 5X35 DISP (ENDOMECHANICALS) IMPLANT
SEALER TISSUE G2 CVD JAW 45CM (ENDOMECHANICALS) IMPLANT
SHEET LAVH (DRAPES) ×3 IMPLANT
SLEEVE Z-THREAD 5X100MM (TROCAR) ×3 IMPLANT
SOL PREP POV-IOD 4OZ 10% (MISCELLANEOUS) ×3 IMPLANT
SPIKE FLUID TRANSFER (MISCELLANEOUS) IMPLANT
SPONGE T-LAP 18X18 ~~LOC~~+RFID (SPONGE) IMPLANT
SUCTION POOLE HANDLE (INSTRUMENTS) ×2
SURGIFLO W/THROMBIN 8M KIT (HEMOSTASIS) IMPLANT
SUT MNCRL AB 4-0 PS2 18 (SUTURE) ×6 IMPLANT
SUT PDS AB 1 TP1 96 (SUTURE) ×6 IMPLANT
SUT VIC AB 2-0 CT1 27 (SUTURE) ×4
SUT VIC AB 2-0 CT1 36 (SUTURE) ×6 IMPLANT
SUT VIC AB 2-0 CT1 TAPERPNT 27 (SUTURE) ×6 IMPLANT
SUT VIC AB 2-0 CT2 27 (SUTURE) ×18 IMPLANT
SUT VIC AB 2-0 SH 27 (SUTURE)
SUT VIC AB 2-0 SH 27X BRD (SUTURE) IMPLANT
SUT VIC AB 3-0 CTX 36 (SUTURE) IMPLANT
SUT VIC AB 3-0 SH 18 (SUTURE) IMPLANT
SUT VIC AB 3-0 SH 27 (SUTURE) ×2
SUT VIC AB 3-0 SH 27X BRD (SUTURE) ×3 IMPLANT
SUT VIC AB 4-0 PS2 27 (SUTURE) IMPLANT
SYR 30ML LL (SYRINGE) ×6 IMPLANT
SYS BAG RETRIEVAL 10MM (BASKET)
SYS RETRIEVAL 5MM INZII UNIV (BASKET)
SYSTEM BAG RETRIEVAL 10MM (BASKET) IMPLANT
SYSTEM RETRIEVL 5MM INZII UNIV (BASKET) IMPLANT
TOWEL OR 17X26 10 PK STRL BLUE (TOWEL DISPOSABLE) ×3 IMPLANT
TOWEL OR NON WOVEN STRL DISP B (DISPOSABLE) ×3 IMPLANT
TRAY FOLEY MTR SLVR 16FR STAT (SET/KITS/TRAYS/PACK) ×3 IMPLANT
TRAY LAPAROSCOPIC (CUSTOM PROCEDURE TRAY) ×3 IMPLANT
TROCAR ADV FIXATION 12X100MM (TROCAR) IMPLANT
TROCAR BALLN 12MMX100 BLUNT (TROCAR) IMPLANT
TROCAR Z-THREAD OPTICAL 5X100M (TROCAR) ×3 IMPLANT
UNDERPAD 30X36 HEAVY ABSORB (UNDERPADS AND DIAPERS) ×3 IMPLANT

## 2022-03-30 NOTE — Brief Op Note (Signed)
03/30/2022  12:23 PM  PATIENT:  April Weber  76 y.o. female  PRE-OPERATIVE DIAGNOSIS:  large pelvic mass acites  POST-OPERATIVE DIAGNOSIS:  large pelvic mass  ascites  PROCEDURE:  Procedure(s): LAPAROSCOPY DIAGNOSTIC (N/A) OPEN SALPINGO OOPHORECTOMY (Bilateral) TUMOR DEBULKING (N/A)  SURGEON:  Surgeon(s) and Role:    Lafonda Mosses, MD - Primary    * Lahoma Crocker, MD - Assisting  ANESTHESIA:   general  EBL:  250 mL   BLOOD ADMINISTERED:none  DRAINS: none   LOCAL MEDICATIONS USED:  marcaine, exparel  SPECIMEN:  right tube and ovary, peritoneal biopsies, omentum  DISPOSITION OF SPECIMEN:  PATHOLOGY  COUNTS:  YES  TOURNIQUET:  * No tourniquets in log *  DICTATION: .Note written in EPIC  PLAN OF CARE: Admit for overnight observation  PATIENT DISPOSITION:  PACU - hemodynamically stable.   Delay start of Pharmacological VTE agent (>24hrs) due to surgical blood loss or risk of bleeding: no

## 2022-03-30 NOTE — Progress Notes (Signed)
Patient here for follow up with Dr. Berline Lopes and for a pre-operative appointment prior to her scheduled surgery on March 30, 2022. She is scheduled for diagnostic laparoscopy, possible biopsies, possible open bilateral salpingo-oophorectomy , possible tumor debulking. The surgery was discussed in detail.  See after visit summary for additional details. Visual aids used to discuss items related to surgery.      Discussed post-op pain management in detail including the aspects of the enhanced recovery pathway.  Advised her that a new prescription would be sent in for tramadol and it is only to be used for after her upcoming surgery.  We discussed the use of tylenol post-op and to monitor for a maximum of 4,000 mg in a 24 hour period.  Also prescribed sennakot to be used after surgery and to hold if having loose stools.  Discussed bowel regimen in detail.     Discussed measures to take at home to prevent DVT including frequent mobility.  Reportable signs and symptoms of DVT discussed. Post-operative instructions discussed and expectations for after surgery. Incisional care discussed as well including reportable signs and symptoms including erythema, drainage, wound separation.     10 minutes spent with the patient.  Verbalizing understanding of material discussed. No needs or concerns voiced at the end of the visit.   Advised patient and family to call for any needs.  Advised that her post-operative medications had been prescribed and could be picked up at any time.    This appointment is included in the global surgical bundle as pre-operative teaching and has no charge.

## 2022-03-30 NOTE — Anesthesia Preprocedure Evaluation (Addendum)
Anesthesia Evaluation  Patient identified by MRN, date of birth, ID band Patient awake    Reviewed: Allergy & Precautions, NPO status , Patient's Chart, lab work & pertinent test results  History of Anesthesia Complications (+) PONV and history of anesthetic complications  Airway Mallampati: II  TM Distance: >3 FB Neck ROM: Full    Dental  (+) Upper Dentures, Partial Lower, Edentulous Upper, Dental Advisory Given   Pulmonary shortness of breath, former smoker   Pulmonary exam normal breath sounds clear to auscultation       Cardiovascular hypertension, Pt. on medications + Peripheral Vascular Disease  Normal cardiovascular exam Rhythm:Regular Rate:Normal     Neuro/Psych negative neurological ROS  negative psych ROS   GI/Hepatic negative GI ROS, Neg liver ROS,,,  Endo/Other  Hypothyroidism    Renal/GU Renal InsufficiencyRenal disease (solitary kidney)  negative genitourinary   Musculoskeletal  (+) Arthritis ,    Abdominal   Peds  Hematology negative hematology ROS (+)   Anesthesia Other Findings LEFT BREAST COMPLEX SCLEROSING LESION  Reproductive/Obstetrics negative OB ROS                             Anesthesia Physical Anesthesia Plan  ASA: 3  Anesthesia Plan: General   Post-op Pain Management: Tylenol PO (pre-op)* and Gabapentin PO (pre-op)*   Induction: Intravenous  PONV Risk Score and Plan: 4 or greater and Treatment may vary due to age or medical condition, Ondansetron and Dexamethasone  Airway Management Planned: Oral ETT  Additional Equipment: None  Intra-op Plan:   Post-operative Plan: Possible Post-op intubation/ventilation  Informed Consent: I have reviewed the patients History and Physical, chart, labs and discussed the procedure including the risks, benefits and alternatives for the proposed anesthesia with the patient or authorized representative who has  indicated his/her understanding and acceptance.     Dental advisory given  Plan Discussed with: CRNA  Anesthesia Plan Comments:         Anesthesia Quick Evaluation

## 2022-03-30 NOTE — Op Note (Signed)
Operative Note  PATIENT: April Weber  DATE: 03/30/22  Preop Diagnosis: Pelvic mass, malignant ascites  Postoperative Diagnosis: at least stage IIB ovarian cancer, suspected high grade serous by frozen section  Surgery: Diagnostic laparoscopy, right salpingo-oophorectomy, peritoneal biopsies, infra-colic omentectomy   Surgeon:  Valarie Cones MD  Assistant: Lahoma Crocker, MD   Anesthesia: General   Estimated blood loss: 250 ml Ascites: 4.7 L  IVF: see I&O flowsheet  Urine output: 25 ml concentrated appearing urine  Complications: None   Pathology: Right tube and ovary, peritoneal biopsies, omentum  Operative findings: On exam under anesthesia, somewhat mobile mass appreciated in the cul-de-sac to the right.  On agnostic laparoscopy, normal upper abdominal findings other than moderate volume ascites.  Normal-appearing peritoneum, omentum, small bowel.  Right adnexa enlarged with a mass measuring approximately 8-10 cm.  No carcinomatosis appreciated.  Given this, decision made to convert to an open procedure for tumor debulking.  Approximately 5 L of yellow-tinged ascites was removed upon intra-abdominal entry.  Normal upper abdominal survey including smooth diaphragm and liver, stomach, and omentum.  The small bowel was run from the cecum to the ligament of Treitz with no abnormalities or carcinomatosis noted.  Right ovary replaced by an approximately 8 cm smooth mass, dilated and enlarged fallopian tube.  The mass itself was adherent to the right pelvic sidewall, sigmoid colon, and densely adherent to the anterior cul-de-sac/bladder peritoneum.  Frozen section consistent with invasive carcinoma, likely high-grade.  Left tube and ovary were surgically absent.  Sigmoid colon with adhesions to the left pelvic sidewall, left aspect of the bladder and the left vaginal cuff.  Somewhat redundant sigmoid which looped over on itself secondary to these adhesions.  Numerous diverticula noted  of the sigmoid colon as well as the transverse colon.  The end of surgery, an R0 resection was accomplished.  Procedure: The patient was identified in the preoperative holding area. Informed consent was signed on the chart. Patient was seen history was reviewed and exam was performed.   The patient was then taken to the operating room and placed in the supine position with SCD hose on. General anesthesia was then induced without difficulty. She was then placed in the dorsolithotomy position. The abdomen was prepped with chlor prep sponges per protocol. Perineum was prepped with Betadine. The vagina was prepped with Betadine a Foley catheter was inserted into the bladder under sterile conditions.  The patient was then draped after the prep was dried. Timeout was performed the patient, procedure, antibiotic, allergy, and length of procedure.  A 5 mm incision was made in the left upper quadrant at Palmer's point and abdominal access was gained using the 5 mm Visiport trocar under direct visualization.  The abdomen was then insufflated to a pressure of 15 mmHg and examined with findings noted above.  Patient was briefly placed in Trendelenburg and then flattened given decision to proceed with open debulking.  With the abdomen still insufflated, A vertical paramedian incision was made with a scalpel and carried down to the underlying fascia using Bovie cautery. The fascia was scored in the fascial incision was extended superiorly and inferiorly using Bovie cautery. The rectus bellies were dissected off the overlying fascia. The peritoneum was tented and entered. The peritoneal incision was extended superiorly and inferiorly with visualization of the underlying peritoneal cavity. The Bookwalter self-retaining retractor was then placed. At the initial placement as well as at several points during the case the lateral blades were checked to ensure no significant  pressure on the psoas bellies.  Once the majority of  the ascites had been removed, the small and large bowel were packed out of the way of the surgical field with moist laparotomy sponges and malleable retractors were attached to the [place].  Attention was then turned to the right.  The peritoneum along the right pelvic sidewall was incised, opening up the retroperitoneum.  The right remnant round ligament was transected.  The incision was carried superiorly paralleling the infundibulopelvic ligament.  With the ureter both visualized and palpated on the right, the infundibulopelvic ligament was isolated and a window was made between the IP ligament and the ureter.  The vessels were then clamped, cauterized using the LigaSure device, and transected.  Combination of blunt dissection and monopolar electrocautery was then used to lyse adhesions of the tumor to the bladder peritoneum as well as the sigmoid colon.  Ultimately monopolar and bipolar electrocautery were used to cauterize and transect the remaining peritoneal attachments of the adnexa down to the vaginal cuff.  Once freed, the right adnexa was handed off the field and sent for frozen section.  Attention was then turned to the left pelvic sidewall.  The left peritoneum was incised and the left retroperitoneum was opened.  The left external iliac vessels were visualized and once the sigmoid colon had been mobilized from the sidewall, no definitive left adnexa was found.  Given somewhat redundant and adherent sigmoid colon to both the left pelvic sidewall, bladder, and cuff, attention was turned towards lysing some of these adhesions.  Given degree of diverticulosis though and no obvious tumor involvement, the decision was made not to continue this lysis of adhesions.  The posterior cul-de-sac was palpated and free of any carcinomatosis.  Some bleeding was noted at the bladder peritoneum where the right adnexa had been adherent.  Running 2-0 Vicryl was used to oversew this area and an adjacent area with  good hemostasis noted after.  At this point frozen section returned.  Multiple peritoneal biopsies were taken.  The small bowel was then unpacked and run from the cecum to the ligaments of Treitz.  An infracolic omentectomy was then performed after the omentum was freed from the transverse colon using monopolar electrocautery.  Omentectomy was performed with the assistance of the LigaSure device.  Pelvis was again inspected.  Some bleeding was noted from a small perforator from the right external iliac artery.  These were clipped x 2.  Pelvis was then irrigated with good hemostasis noted.  Surgiflo was placed along the surgical bed of the right pelvic sidewall.  The ureter again was visualized and noted to be peristalsing.  The retractor and laparotomy sponges were removed. The fascia was closed using two running looped #1 PDS tied in the midline. The subcutaneous tissues were irrigated and made hemostatic.  Exparel mixed with Marcaine was then injected for local anesthesia.  The subcutaneous tissue was reapproximated using running 2-0 Vicryl.  The superficial subcutaneous tissue was reapproximated using 4-0 Vicryl and the skin closed with 4-0 Monocryl in subcuticular fashion.  Dermabond was applied to the skin.  The 5 mm port was closed using 4-0 Monocryl and Dermabond was applied.  The larger incision was covered with a honeycomb dressing once the Dermabond had dried.  All instrument, suture, laparotomy, Ray-Tec, and needle counts were correct x2. The patient tolerated the procedure well and was taken recovery room in stable condition.   Jeral Pinch MD Gynecologic Oncology

## 2022-03-30 NOTE — Transfer of Care (Signed)
Immediate Anesthesia Transfer of Care Note  Patient: April Weber  Procedure(s) Performed: LAPAROSCOPY DIAGNOSTIC (Abdomen) OPEN SALPINGO OOPHORECTOMY (Bilateral: Abdomen) TUMOR DEBULKING (Abdomen)  Patient Location: PACU  Anesthesia Type:General  Level of Consciousness: drowsy  Airway & Oxygen Therapy: Patient Spontanous Breathing and Patient connected to face mask oxygen  Post-op Assessment: Report given to RN and Post -op Vital signs reviewed and stable  Post vital signs: Reviewed and stable  Last Vitals:  Vitals Value Taken Time  BP 128/69 03/30/22 1211  Temp    Pulse 78 03/30/22 1214  Resp 11 03/30/22 1214  SpO2 100 % 03/30/22 1214  Vitals shown include unvalidated device data.  Last Pain:  Vitals:   03/30/22 0800  TempSrc: Oral  PainSc: 0-No pain         Complications: No notable events documented.

## 2022-03-30 NOTE — Interval H&P Note (Signed)
History and Physical Interval Note:  03/30/2022 7:09 AM  April Weber  has presented today for surgery, with the diagnosis of large pelvic mass acites.  The various methods of treatment have been discussed with the patient and family. After consideration of risks, benefits and other options for treatment, the patient has consented to  Procedure(s): LAPAROSCOPY DIAGNOSTIC with biopsies (N/A) possible, OPEN SALPINGO OOPHORECTOMY (Bilateral) possible TUMOR DEBULKING (N/A) as a surgical intervention.  The patient's history has been reviewed, patient examined, no change in status, stable for surgery.  I have reviewed the patient's chart and labs.  Questions were answered to the patient's satisfaction.     Lafonda Mosses

## 2022-03-30 NOTE — Patient Instructions (Signed)
Preparing for your Surgery   Plan for surgery on probable March 30, 2022 with Dr. Jeral Pinch at [facility] will be scheduled for diagnostic laparoscopy (looking into the abdomen with a camera through small incisions), possible biopsies, possible open bilateral salpingo-oophorectomy (removal of both ovaries and fallopian tubes), possible tumor debulking.    We will contact you later today to confirm the above date for surgery.   Pre-operative Testing -You will receive a phone call from presurgical testing at [facility] to arrange for a pre-operative appointment and lab work.   -Bring your insurance card, copy of an advanced directive if applicable, medication list   -At that visit, you will be asked to sign a consent for a possible blood transfusion in case a transfusion becomes necessary during surgery.  The need for a blood transfusion is rare but having consent is a necessary part of your care.      -You are fine to keep taking baby aspirin before surgery with your last dose being the day before surgery in the am.   -Do not take supplements such as fish oil (omega 3), red yeast rice, turmeric before your surgery. You want to avoid medications with aspirin in them including headache powders such as BC or Goody's), Excedrin migraine.   Day Before Surgery at [facility] will be asked to take in a light diet the day before surgery. You will be advised you can have clear liquids up until 3 hours before your surgery.     Eat a light diet the day before surgery.  Examples including soups, broths, toast, yogurt, mashed potatoes.  AVOID GAS PRODUCING FOODS. Things to avoid include carbonated beverages (fizzy beverages, sodas), raw fruits and raw vegetables (uncooked), or beans.    If your bowels are filled with gas, your surgeon will have difficulty visualizing your pelvic organs which increases your surgical risks.   Your role in recovery Your role is to become  active as soon as directed by your doctor, while still giving yourself time to heal.  Rest when you feel tired. You will be asked to do the following in order to speed your recovery:   - Cough and breathe deeply. This helps to clear and expand your lungs and can prevent pneumonia after surgery.  - [place]. Do mild physical activity. Walking or moving your legs help your circulation and body functions return to normal. Do not try to get up or walk alone the first time after surgery.   -If you develop swelling on one leg or the other, pain in the back of your leg, redness/warmth in one of your legs, please call the office or go to the Emergency Room to have a doppler to rule out a blood clot. For shortness of breath, chest pain-seek care in the Emergency Room as soon as possible. - Actively manage your pain. Managing your pain lets you move in comfort. We will ask you to rate your pain on a scale of zero to 10. It is your responsibility to tell your doctor or nurse where and how much you hurt so your pain can be treated.   Special Considerations -If you are diabetic, you may be placed on insulin after surgery to have closer control over your blood sugars to promote healing and recovery.  This does not mean that you will be discharged on insulin.  If applicable, your oral antidiabetics will be resumed when you are tolerating a solid  diet.   -Your final pathology results from surgery should be available around one week after surgery and the results will be relayed to you when available.   -FMLA forms can be faxed to [phone] and please allow 5-7 business days for completion.   Pain Management After Surgery -You have been prescribed your pain medication and bowel regimen medications before surgery so that you can have these available when you are discharged from the hospital. The pain medication is for use ONLY AFTER surgery and a new prescription will not be given.    -Make  sure that you have Tylenol IF YOU ARE ABLE TO TAKE THESE MEDICATION at home to use on a regular basis after surgery for pain control.    -Review the attached handout on narcotic use and their risks and side effects.    Bowel Regimen -You have been prescribed Sennakot-S to take nightly to prevent constipation especially if you are taking the narcotic pain medication intermittently.  It is important to prevent constipation and drink adequate amounts of liquids. You can stop taking this medication when you are not taking pain medication and you are back on your normal bowel routine.   Risks of Surgery Risks of surgery are low but include bleeding, infection, damage to surrounding structures, re-operation, blood clots, and very rarely death.     Blood Transfusion Information (For the consent to be signed before surgery)   We will be checking your blood type before surgery so in case of emergencies, we will know what type of blood you would need.                                             WHAT IS A BLOOD TRANSFUSION?   A transfusion is the replacement of blood or some of its parts. Blood is made up of multiple cells which provide different functions. Red blood cells carry oxygen and are used for blood loss replacement. White blood cells fight against infection. Platelets control bleeding. Plasma helps clot blood. Other blood products are available for specialized needs, such as hemophilia or other clotting disorders. BEFORE THE TRANSFUSION  Who gives blood for transfusions?  You may be able to donate blood to be used at a later date on yourself (autologous donation). Relatives can be asked to donate blood. This is generally not any safer than if you have received blood from a stranger. The same precautions are taken to ensure safety when a relative's blood is donated. Healthy volunteers who are fully evaluated to make sure their blood is safe. This is blood bank blood. Transfusion therapy is  the safest it has ever been in the practice of medicine. Before blood is taken from a donor, a complete history is taken to make sure that person has no history of diseases nor engages in risky social behavior (examples are intravenous drug use or sexual activity with multiple partners). The donor's travel history is screened to minimize risk of transmitting infections, such as malaria. The donated blood is tested for signs of infectious diseases, such as HIV and hepatitis. The blood is then tested to be sure it is compatible with you in order to minimize the chance of a transfusion reaction. If you or a relative donates blood, this is often done in anticipation of surgery and is not appropriate for emergency situations. It takes many days to process the  donated blood. RISKS AND COMPLICATIONS Although transfusion therapy is very safe and saves many lives, the main dangers of transfusion include:  Getting an infectious disease. Developing a transfusion reaction. This is an allergic reaction to something in the blood you were given. Every precaution is taken to prevent this. The decision to have a blood transfusion has been considered carefully by your caregiver before blood is given. Blood is not given unless the benefits outweigh the risks.   AFTER SURGERY INSTRUCTIONS   Return to work: 4-6 weeks if applicable   You will be recommended to have 4 weeks of post-operative blood thinner to prevent blood clots if you have the open debulking surgery. This can be in pill form or injections.   You may have a white honeycomb dressing over your larger incision. This dressing can be removed 5 days after surgery and you do not need to reapply a new dressing. Once you remove the dressing, you will notice that you have the surgical glue (dermabond) on the incision and this will peel off on its own. You can get this dressing wet in the shower the days after surgery prior to removal on the 5th day.   Activity: 1. Be  up and out of the bed during the day.  Take a nap if needed.  You may walk up steps but be careful and use the hand rail.  Stair climbing will tire you more than you think, you may need to stop part way and rest.    2. No lifting or straining for 6 weeks over 10 pounds. No pushing, pulling, straining for 6 weeks.   3. No driving for around 4-40 days.  Do not drive if you are taking narcotic pain medicine and make sure that your reaction time has returned.    4. You can shower as soon as the next day after surgery. Shower daily.  Use your regular soap and water (not directly on the incision) and pat your incision(s) dry afterwards; don't rub.  No tub baths or submerging your body in water until cleared by your surgeon. If you have the soap that was given to you by pre-surgical testing that was used before surgery, you do not need to use it afterwards because this can irritate your incisions.    5. No sexual activity and nothing in the vagina for 4 weeks.   6. You may experience a small amount of clear drainage from your incisions, which is normal.  If the drainage persists, increases, or changes color please call the office.   7. Do not use creams, lotions, or ointments such as neosporin on your incisions after surgery until advised by your surgeon because they can cause removal of the dermabond glue on your incisions.     8. You may experience vaginal spotting after surgery or around the 6-8 week mark from surgery when the stitches at the top of the vagina begin to dissolve.  The spotting is normal but if you experience heavy bleeding, call our office.   9. Take Tylenol first for pain if you are able to take these medications and only use narcotic pain medication for severe pain not relieved by the Tylenol.  Monitor your Tylenol intake to a max of 4,000 mg in a 24 hour period.    Diet: 1. Low sodium Heart Healthy Diet is recommended but you are cleared to resume your normal (before surgery) diet  after your procedure.   2. It is safe to use a  laxative, such as Miralax or Colace, if you have difficulty moving your bowels. You have been prescribed Sennakot-S to take at bedtime every evening after surgery to keep bowel movements regular and to prevent constipation.     Wound Care: 1. Keep clean and dry.  Shower daily.   Reasons to call the Doctor: Fever - Oral temperature greater than 100.4 degrees Fahrenheit Foul-smelling vaginal discharge Difficulty urinating Nausea and vomiting Increased pain at the site of the incision that is unrelieved with pain medicine. Difficulty breathing with or without chest pain New calf pain especially if only on one side Sudden, continuing increased vaginal bleeding with or without clots.   Contacts: For questions or concerns you should contact:   Dr. Jeral Pinch at [phone]   Joylene John, NP at [phone]   After Hours: call [phone] and have the GYN Oncologist paged/contacted (after 5 pm or on the weekends).   Messages sent via mychart are for non-urgent matters and are not responded to after hours so for urgent needs, please call the after hours number.

## 2022-03-30 NOTE — Telephone Encounter (Signed)
Emailed the daughters FMLA

## 2022-03-30 NOTE — Anesthesia Procedure Notes (Signed)
Procedure Name: Intubation Date/Time: 03/30/2022 9:42 AM  Performed by: Sharlette Dense, CRNAPatient Re-evaluated:Patient Re-evaluated prior to induction Oxygen Delivery Method: Circle system utilized Preoxygenation: Pre-oxygenation with 100% oxygen Induction Type: IV induction Ventilation: Mask ventilation without difficulty and Oral airway inserted - appropriate to patient size Laryngoscope Size: Sabra Heck and 2 Grade View: Grade I Tube size: 7.5 mm Number of attempts: 1 Airway Equipment and Method: Stylet Placement Confirmation: ETT inserted through vocal cords under direct vision, positive ETCO2 and breath sounds checked- equal and bilateral Secured at: 22 cm Tube secured with: Tape Dental Injury: Teeth and Oropharynx as per pre-operative assessment

## 2022-03-31 ENCOUNTER — Encounter (HOSPITAL_COMMUNITY): Payer: Self-pay | Admitting: Gynecologic Oncology

## 2022-03-31 ENCOUNTER — Other Ambulatory Visit (HOSPITAL_COMMUNITY): Payer: Self-pay

## 2022-03-31 LAB — CBC
HCT: 32.6 % — ABNORMAL LOW (ref 36.0–46.0)
HCT: 32.2 % — ABNORMAL LOW (ref 36.0–46.0)
Hemoglobin: 10.6 g/dL — ABNORMAL LOW (ref 12.0–15.0)
Hemoglobin: 10.8 g/dL — ABNORMAL LOW (ref 12.0–15.0)
MCH: 30.6 pg (ref 26.0–34.0)
MCH: 31.4 pg (ref 26.0–34.0)
MCHC: 32.5 g/dL (ref 30.0–36.0)
MCHC: 33.5 g/dL (ref 30.0–36.0)
MCV: 94.2 fL (ref 80.0–100.0)
MCV: 93.6 fL (ref 80.0–100.0)
Platelets: 271 10*3/uL (ref 150–400)
Platelets: 280 10*3/uL (ref 150–400)
RBC: 3.46 MIL/uL — ABNORMAL LOW (ref 3.87–5.11)
RBC: 3.44 MIL/uL — ABNORMAL LOW (ref 3.87–5.11)
RDW: 12.9 % (ref 11.5–15.5)
RDW: 12.9 % (ref 11.5–15.5)
WBC: 22.1 10*3/uL — ABNORMAL HIGH (ref 4.0–10.5)
WBC: 24.9 10*3/uL — ABNORMAL HIGH (ref 4.0–10.5)
nRBC: 0 % (ref 0.0–0.2)
nRBC: 0 % (ref 0.0–0.2)

## 2022-03-31 LAB — BASIC METABOLIC PANEL
Anion gap: 3 — ABNORMAL LOW (ref 5–15)
BUN: 17 mg/dL (ref 8–23)
CO2: 23 mmol/L (ref 22–32)
Calcium: 7.3 mg/dL — ABNORMAL LOW (ref 8.9–10.3)
Chloride: 107 mmol/L (ref 98–111)
Creatinine, Ser: 1.26 mg/dL — ABNORMAL HIGH (ref 0.44–1.00)
GFR, Estimated: 44 mL/min — ABNORMAL LOW (ref >60)
Glucose, Bld: 180 mg/dL — ABNORMAL HIGH (ref 70–99)
Potassium: 4.5 mmol/L (ref 3.5–5.1)
Sodium: 133 mmol/L — ABNORMAL LOW (ref 135–145)

## 2022-03-31 MED ORDER — APIXABAN 2.5 MG PO TABS
2.5000 mg | ORAL_TABLET | Freq: Two times a day (BID) | ORAL | 0 refills | Status: DC
  Filled 2022-03-31: qty 56, 28d supply, fill #0

## 2022-03-31 MED ORDER — APIXABAN 2.5 MG PO TABS: 2.5000 mg | ORAL_TABLET | Freq: Two times a day (BID) | ORAL | 0 refills | Status: DC

## 2022-03-31 NOTE — Discharge Instructions (Addendum)
AFTER SURGERY INSTRUCTIONS   Return to work: 4-6 weeks if applicable   You will need to take Eliquis 2.5 mg twice daily starting Friday, April 02, 2022. You can use the tablets you have at home since it is the same medication and same dose.   You may have a white honeycomb dressing over your larger incision. This dressing can be removed 5 days after surgery and you do not need to reapply a new dressing. Once you remove the dressing, you will notice that you have the surgical glue (dermabond) on the incision and this will peel off on its own. You can get this dressing wet in the shower the days after surgery prior to removal on the 5th day.   Activity: 1. Be up and out of the bed during the day.  Take a nap if needed.  You may walk up steps but be careful and use the hand rail.  Stair climbing will tire you more than you think, you may need to stop part way and rest.    2. No lifting or straining for 6 weeks over 10 pounds. No pushing, pulling, straining for 6 weeks.   3. No driving for around 7-41 days.  Do not drive if you are taking narcotic pain medicine and make sure that your reaction time has returned.    4. You can shower as soon as the next day after surgery. Shower daily.  Use your regular soap and water (not directly on the incision) and pat your incision(s) dry afterwards; don't rub.  No tub baths or submerging your body in water until cleared by your surgeon. If you have the soap that was given to you by pre-surgical testing that was used before surgery, you do not need to use it afterwards because this can irritate your incisions.    5. No sexual activity and nothing in the vagina for 4 weeks.   6. You may experience a small amount of clear drainage from your incisions, which is normal.  If the drainage persists, increases, or changes color please call the office.   7. Do not use creams, lotions, or ointments such as neosporin on your incisions after surgery until advised by your  surgeon because they can cause removal of the dermabond glue on your incisions.     8. Take Tylenol first for pain if you are able to take these medications and only use narcotic pain medication for severe pain not relieved by the Tylenol.  Monitor your Tylenol intake to a max of 4,000 mg in a 24 hour period.    Diet: 1. Low sodium Heart Healthy Diet is recommended but you are cleared to resume your normal (before surgery) diet after your procedure.   2. It is safe to use a laxative, such as Miralax or Colace, if you have difficulty moving your bowels. You have been prescribed Sennakot-S to take at bedtime every evening after surgery to keep bowel movements regular and to prevent constipation.     Wound Care: 1. Keep clean and dry.  Shower daily.   Reasons to call the Doctor: Fever - Oral temperature greater than 100.4 degrees Fahrenheit Foul-smelling vaginal discharge Difficulty urinating Nausea and vomiting Increased pain at the site of the incision that is unrelieved with pain medicine. Difficulty breathing with or without chest pain New calf pain especially if only on one side Sudden, continuing increased vaginal bleeding with or without clots.   Contacts: For questions or concerns you should contact:  Dr. Jeral Pinch at [phone]   Joylene John, NP at [phone]   After Hours: call [phone] and have the GYN Oncologist paged/contacted (after 5 pm or on the weekends).   Messages sent via mychart are for non-urgent matters and are not responded to after hours so for urgent needs, please call the after hours number.

## 2022-03-31 NOTE — Progress Notes (Signed)
GYN Oncology Progress Note  Per patient's daughter on the phone, pt is doing well. Has ambulated. Ate solid food for breakfast and tolerated this well. Due to void since foley removal. Advised about the copay cost for Eliquis post-op being around $500. Given her kidney function, [facility] pharmacist, Terri, felt xarelto should be avoided. Patient would like to avoid injections if possible. Advised daughter we would work to see about getting the [employer] at the [employer] to assist with the copay cost. Verbalizing understanding.

## 2022-03-31 NOTE — Anesthesia Postprocedure Evaluation (Signed)
Anesthesia Post Note  Patient: April Weber  Procedure(s) Performed: LAPAROSCOPY DIAGNOSTIC (Abdomen) OPEN SALPINGO OOPHORECTOMY (Bilateral: Abdomen) TUMOR DEBULKING (Abdomen)     Patient location during evaluation: PACU Anesthesia Type: General Level of consciousness: sedated and patient cooperative Pain management: pain level controlled Vital Signs Assessment: post-procedure vital signs reviewed and stable Respiratory status: spontaneous breathing Cardiovascular status: stable Anesthetic complications: no   No notable events documented.  Last Vitals:  Vitals:   03/30/22 2155 03/31/22 0603  BP:  (!) 96/57  Pulse:  65  Resp:  18  Temp:  36.8 C  SpO2: 97% 93%    Last Pain:  Vitals:   03/31/22 0603  TempSrc: Oral  PainSc:                  Nolon Nations

## 2022-03-31 NOTE — Progress Notes (Addendum)
1 Day Post-Op Procedure(s) (LRB): LAPAROSCOPY DIAGNOSTIC (N/A) OPEN SALPINGO OOPHORECTOMY (Bilateral) TUMOR DEBULKING (N/A)  Subjective: Patient reports doing well this am. Only ate crackers yesterday. Has not been out of bed. No nausea or emesis reported. Passing flatus. Last BM on Monday. Abdominal pain reported as manageable. States foley was just emptied and had to be emptied twice due to being so full. Denies chest pain, dyspnea. Daughter asleep at the bedside. No concerns voiced. All questions answered.  Objective: Vital signs in last 24 hours: Temp:  [97.5 F (36.4 C)-98.4 F (36.9 C)] 98.3 F (36.8 C) (11/22 0603) Pulse Rate:  [62-79] 65 (11/22 0603) Resp:  [12-18] 18 (11/22 0603) BP: (96-128)/(49-69) 96/57 (11/22 0603) SpO2:  [90 %-100 %] 93 % (11/22 0603)    Intake/Output from previous day: 11/21 0701 - 11/22 0700 In: 4032.6 [P.O.:500; I.V.:3482.6; IV Piggyback:50] Out: 1610 [Urine:1325; Blood:250]  Physical Examination: General: alert, cooperative, appears stated age, and no distress Resp: clear to auscultation bilaterally Cardio: regular rate and rhythm, S1, S2 normal, no murmur, click, rub or gallop GI: soft, non-tender; bowel sounds normal; no masses,  no organomegaly and incision: lap site in the left upper abdomen with dermabond intact, midline abdominal incision with op site dressing in place with no drainage noted underneath but surrounding erythema (appears to be possible reaction to dermabond) Extremities: extremities normal, atraumatic, no cyanosis or edema Left port a cath incision with peeling dermabond-mild erythema around this as well where dermabond remains Foley in place  Labs: WBC/Hgb/Hct/Plts:  22.1/10.6/32.6/271 (11/22 0601) BUN/Cr/glu/ALT/AST/amyl/lip:  17/1.26/--/--/--/--/-- (11/22 0601)  Assessment: 76 y.o. s/p Procedure(s): LAPAROSCOPY DIAGNOSTIC, OPEN SALPINGO OOPHORECTOMY, TUMOR DEBULKING: stable Pain:  Pain is well-controlled on PRN  medications.  Heme: Hgb 10.6 and Hct 32.6 this am- appropriate given preop values, surgical losses, IVF dilution.  ID: WBC 22.1 this am- no evidence of infection. Patient received intra-op decadron.  CV: BP and HR stable. Continue to monitor with ordered vital signs.  GI:  Tolerating po: has only taken in crackers. Reports having a strong appetite this am. Antiemetics ordered as needed.  GU: 1300 cc urine in foley overnight. Creatinine 1.26 this am-slightly improved from baseline given hydration.  FEN: No critical values on am labs.  Prophylaxis: intermittent pneumatic compression boots and lovenox ordered  Plan: Foley to be removed Diet as tolerated Saline lock IV Abdominal binder Encourage ambulation, IS If patient doing well this afternoon and meeting milestones, plan for possible discharge Continue plan of care per Dr. Berline Lopes   LOS: 1 day    Corvette Orser D Kamirah Shugrue 03/31/2022, 8:05 AM

## 2022-03-31 NOTE — Progress Notes (Signed)
Mobility Specialist - Progress Note   03/31/22 0931  Mobility  Activity Ambulated with assistance in hallway  Level of Assistance Independent after set-up  Assistive Device  (IV Pole)  Distance Ambulated (ft) 500 ft  Activity Response Tolerated well  Mobility Referral Yes  $Mobility charge 1 Mobility   Pt received in bed and agreeable to mobility. C/o soreness around incision area.  Pt to bed after session with all needs met & family in room.    [facility]

## 2022-03-31 NOTE — Discharge Summary (Signed)
Physician Discharge Summary  Patient ID: April Weber MRN: [id number] DOB/AGE: 12/08/1945 76 y.o.  Admit date: 03/30/2022 Discharge date: 03/31/2022  Admission Diagnoses: Ovarian cancer [facility])  Discharge Diagnoses:  Principal Problem:   Ovarian cancer [facility])   Discharged Condition:  The patient is in good condition and stable for discharge.    Hospital Course: On 03/30/2022, the patient underwent the following: Procedure(s): LAPAROSCOPY DIAGNOSTIC OPEN SALPINGO OOPHORECTOMY TUMOR DEBULKING. The postoperative course was uneventful.  She was discharged to home on postoperative day 1 tolerating a regular diet, passing flatus, ambulating, voiding, pain controlled. She will plan to use the Eliquis 2.5 mg tablets she has currently at home that are in date to take twice daily for a total of 28 days starting on Friday, April 02, 2022.   Consults: None  Significant Diagnostic Studies: Labs  Treatments: surgery: see above  Discharge Exam: Blood pressure (!) 109/59, pulse 70, temperature 98.3 F (36.8 C), temperature source Oral, resp. rate 18, height '5\' 7"'$  (1.702 m), weight 179 lb 14.3 oz (81.6 kg), SpO2 95 %. General appearance: alert, cooperative, and no distress Resp: clear to auscultation bilaterally Cardio: regular rate and rhythm, S1, S2 normal, no murmur, click, rub or gallop GI: soft, non-tender; bowel sounds normal; no masses,  no organomegaly Extremities: extremities normal, atraumatic, no cyanosis or edema Incision/Wound: Lap site incision in upper left abdomin intact with dermabond present. Midline op site dressing intact with less erythema surrounding the incision on the right compared to am assessment.  Disposition: Discharge disposition: 01-Home or Self Care       Discharge Instructions     Call MD for:  difficulty breathing, headache or visual disturbances   Complete by: As directed    Call MD for:  extreme fatigue   Complete by: As directed    Call MD for:   hives   Complete by: As directed    Call MD for:  persistant dizziness or light-headedness   Complete by: As directed    Call MD for:  persistant nausea and vomiting   Complete by: As directed    Call MD for:  redness, tenderness, or signs of infection (pain, swelling, redness, odor or green/yellow discharge around incision site)   Complete by: As directed    Call MD for:  severe uncontrolled pain   Complete by: As directed    Call MD for:  temperature >100.4   Complete by: As directed    Diet - low sodium heart healthy   Complete by: As directed    Discharge wound care:   Complete by: As directed    You will have a white honeycomb dressing over your larger incision. This dressing can be removed 5 days after surgery and you do not need to reapply a new dressing. Once you remove the dressing, you will notice that you have the surgical glue (dermabond) on the incision and this will peel off on its own. You can get this dressing wet in the shower the days after surgery prior to removal on the 5th day.   Driving Restrictions   Complete by: As directed    No driving for around 1-75 days.  Do not take narcotics and drive. You need to make sure your reaction time has returned.   Increase activity slowly   Complete by: As directed    Lifting restrictions   Complete by: As directed    No lifting greater than 10 lbs, pushing, pulling, straining for 6 weeks.   Sexual  Activity Restrictions   Complete by: As directed    No sexual activity, nothing in the vagina, for 4 weeks.      Allergies as of 03/31/2022       Reactions   Codeine Nausea And Vomiting   Nsaids Other (See Comments)   Told to avoid due to kidney function   Penicillins Hives   Vibra-tab [doxycycline] Hives   Iodine Rash   Latex Rash   Occasionally gets a localized rash on contact with certain latex products        Medication List     STOP taking these medications    aspirin EC 81 MG tablet       TAKE these  medications    acetaminophen 500 MG tablet Commonly known as: TYLENOL Take 500-1,000 mg by mouth daily as needed for moderate pain, fever or headache.   amLODipine 5 MG tablet Commonly known as: NORVASC Take 1 tablet (5 mg total) by mouth daily. What changed: when to take this   apixaban 2.5 MG Tabs tablet Commonly known as: Eliquis Take 1 tablet (2.5 mg total) by mouth 2 (two) times daily for 28 days. For AFTER surgery only, Plan to start taking on Friday, Apr 02, 2022. You can use the tablets you have at home. Start taking on: April 02, 2022   atenolol 25 MG tablet Commonly known as: TENORMIN Take 1 tablet (25 mg total) by mouth daily. What changed:  how much to take when to take this   CALCIUM + VITAMIN D3 PO Take 1 tablet by mouth 2 (two) times daily.   levothyroxine 100 MCG tablet Commonly known as: SYNTHROID Take 1/2 tablet on Sundays and 1 tablet daily all other days What changed:  how much to take how to take this when to take this additional instructions   loratadine 10 MG tablet Commonly known as: CLARITIN Take 10 mg by mouth at bedtime.   multivitamin tablet Take 1 tablet by mouth in the morning.   senna-docusate 8.6-50 MG tablet Commonly known as: Senokot-S Take 2 tablets by mouth at bedtime. For AFTER surgery, do not take if having diarrhea   simvastatin 20 MG tablet Commonly known as: ZOCOR Take 1 tablet (20 mg total) by mouth at bedtime. Cancel rx for '10mg'$ .   traMADol 50 MG tablet Commonly known as: ULTRAM Take 1 tablet (50 mg total) by mouth every 6 (six) hours as needed for severe pain. For AFTER surgery only, do not take and drive               Discharge Care Instructions  (From admission, onward)           Start     Ordered   03/31/22 0000  Discharge wound care:       Comments: You will have a white honeycomb dressing over your larger incision. This dressing can be removed 5 days after surgery and you do not need to reapply  a new dressing. Once you remove the dressing, you will notice that you have the surgical glue (dermabond) on the incision and this will peel off on its own. You can get this dressing wet in the shower the days after surgery prior to removal on the 5th day.   03/31/22 1427            Follow-up Information     Lafonda Mosses, MD Follow up on 04/07/2022.   Specialty: Gynecologic Oncology Why: around 4:50pm will be a PHONE visit with Dr.  Berline Lopes to check in and discuss final pathology,. IN PERSON visit will be on 04/20/2022 at 3:30pm at the [facility]. Contact information: [place] [address] [phone]                 Greater than thirty minutes were spend for face to face discharge instructions and discharge orders/summary in EPIC.   Signed: Dorothyann Gibbs 03/31/2022, 2:31 PM

## 2022-03-31 NOTE — Progress Notes (Signed)
Mobility Specialist - Progress Note   03/31/22 1301  Mobility  Activity Ambulated with assistance in hallway  Level of Assistance Independent after set-up  Assistive Device  (IV Pole)  Distance Ambulated (ft) 500 ft  Activity Response Tolerated well  Mobility Referral Yes  $Mobility charge 1 Mobility   Pt received in bed and agreeable to mobility. C/o abdominal pain they rated during mobility. Pt to bed after session with all needs met w/ daughter in room.    [facility]

## 2022-04-02 ENCOUNTER — Telehealth: Payer: Self-pay | Admitting: Surgery

## 2022-04-02 NOTE — Telephone Encounter (Signed)
Spoke with April Weber this morning. She states she is eating, drinking and urinating well. She has not had a BM yet but is passing gas. She is taking senokot as prescribed and encouraged her to drink plenty of water. She denies fever or chills. Incisions are dry and intact. She rates her pain 5/10. Her pain is controlled with Tylenol.    Instructed to call office with any fever, chills, purulent drainage, uncontrolled pain or any other questions or concerns. Patient verbalizes understanding.   Pt aware of post op appointments as well as the office number [phone] and after hours number [phone] to call if she has any questions or concerns

## 2022-04-05 ENCOUNTER — Other Ambulatory Visit: Payer: Self-pay | Admitting: *Deleted

## 2022-04-05 NOTE — Progress Notes (Signed)
The proposed treatment discussed in conference is for discussion purpose only and is not a binding recommendation.  The patients have not been physically examined, or presented with their treatment options.  Therefore, final treatment plans cannot be decided.  

## 2022-04-07 ENCOUNTER — Inpatient Hospital Stay: Payer: Medicare Other | Admitting: Gynecologic Oncology

## 2022-04-07 ENCOUNTER — Telehealth: Payer: Self-pay | Admitting: *Deleted

## 2022-04-07 DIAGNOSIS — C800 Disseminated malignant neoplasm, unspecified: Secondary | ICD-10-CM

## 2022-04-07 DIAGNOSIS — N838 Other noninflammatory disorders of ovary, fallopian tube and broad ligament: Secondary | ICD-10-CM

## 2022-04-07 NOTE — Telephone Encounter (Signed)
Per Dr Berline Lopes canceled phone visit for today. Pathology results are not back, she will call the patient once results are done. Spoke with the patient and ahe verbalized understanding

## 2022-04-08 ENCOUNTER — Telehealth: Payer: Self-pay | Admitting: Gynecologic Oncology

## 2022-04-08 ENCOUNTER — Telehealth: Payer: Self-pay | Admitting: *Deleted

## 2022-04-08 LAB — SURGICAL PATHOLOGY

## 2022-04-08 NOTE — Telephone Encounter (Signed)
Called patient to discuss pathology. She is doing well post-op, pain improving. Still some pain on the right side. Bowel improved after used a laxative. Urinating at baseline.  All questions answered. Also spoke with her daughter.  Jeral Pinch MD Gynecologic Oncology

## 2022-04-08 NOTE — Patient Outreach (Signed)
  Care Coordination TOC Note Transition Care Management Follow-up Telephone Call Date of discharge and from where: [facility] 03/31/22 How have you been since you were released from the hospital? "Pain is better and I'm trying to do more to build my strength up. I just got back from the grocery store." Any questions or concerns? Yes Pathology results. Expecting a call from Dr Berline Lopes once she reviews the results.   Items Reviewed: Did the pt receive and understand the discharge instructions provided? Yes  Medications obtained and verified? Yes  Other? No  Any new allergies since your discharge? No  Dietary orders reviewed? Yes Do you have support at home? Yes   Home Care and Equipment/Supplies: Were home health services ordered? no If so, what is the name of the agency?   Has the agency set up a time to come to the patient's home? not applicable Were any new equipment or medical supplies ordered?  No What is the name of the medical supply agency?  Were you able to get the supplies/equipment? not applicable Do you have any questions related to the use of the equipment or supplies? No  Functional Questionnaire: (I = Independent and D = Dependent) ADLs: I  Bathing/Dressing- I  Meal Prep- I  Eating- I  Maintaining continence- I  Transferring/Ambulation- I  Managing Meds- I  Follow up appointments reviewed:  PCP Hospital f/u appt confirmed? No  Not needed Specialist Hospital f/u appt confirmed? Yes  Scheduled to see Dr Berline Lopes ([facility]) on 04/20/22 at 3:30 Are transportation arrangements needed? No  If their condition worsens, is the pt aware to call PCP or go to the Emergency Dept.? Yes Was the patient provided with contact information for the PCP's office or ED? Yes Was to pt encouraged to call back with questions or concerns? Yes  SDOH assessments and interventions completed:   Yes SDOH Interventions Today    Flowsheet Row Most Recent Value  SDOH Interventions    Housing Interventions Intervention Not Indicated  Transportation Interventions Intervention Not Indicated       Care Coordination Interventions:  No Care Coordination interventions needed at this time.   Encounter Outcome:  Pt. Visit Completed    Chong Sicilian, BSN, RN-BC RN Care Coordinator [place] Direct Dial: [phone] Main #: [phone]

## 2022-04-09 ENCOUNTER — Other Ambulatory Visit: Payer: Self-pay | Admitting: Hematology and Oncology

## 2022-04-09 ENCOUNTER — Encounter: Payer: Self-pay | Admitting: Hematology and Oncology

## 2022-04-09 DIAGNOSIS — C561 Malignant neoplasm of right ovary: Secondary | ICD-10-CM

## 2022-04-09 NOTE — Progress Notes (Signed)
ON PATHWAY REGIMEN - Ovarian  No Change  Continue With Treatment as Ordered.  Original Decision Date/Time: 03/19/2022 13:29     A cycle is every 21 days:     Paclitaxel      Carboplatin   **Always confirm dose/schedule in your pharmacy ordering system**  Patient Characteristics: Preoperative or Nonsurgical Candidate (Clinical Staging), Newly Diagnosed, Neoadjuvant Therapy followed by Surgery BRCA Mutation Status: Awaiting Test Results Therapeutic Status: Preoperative or Nonsurgical Candidate (Clinical Staging) AJCC T Category: cT3 AJCC 8 Stage Grouping: Unknown AJCC N Category: cN0 AJCC M Category: cM0 Therapy Plan: Neoadjuvant Therapy followed by Surgery Intent of Therapy: Curative Intent, Discussed with Patient

## 2022-04-13 ENCOUNTER — Encounter: Payer: Self-pay | Admitting: Hematology and Oncology

## 2022-04-19 ENCOUNTER — Telehealth: Payer: Self-pay | Admitting: Surgery

## 2022-04-19 NOTE — Assessment & Plan Note (Signed)
I have reviewed final pathology report We discussed the role of adjuvant treatment We reviewed the NCCN guidelines We discussed the role of chemotherapy. The intent is of curative intent.  We discussed some of the risks, benefits, side-effects of carboplatin & Taxol. Treatment is intravenous, every 3 weeks x 6 cycles  Some of the short term side-effects included, though not limited to, including weight loss, life threatening infections, risk of allergic reactions, need for transfusions of blood products, nausea, vomiting, change in bowel habits, loss of hair, admission to hospital for various reasons, and risks of death.   Long term side-effects are also discussed including risks of infertility, permanent damage to nerve function, hearing loss, chronic fatigue, kidney damage with possibility needing hemodialysis, and rare secondary malignancy including bone marrow disorders.  The patient is aware that the response rates discussed earlier is not guaranteed.  After a long discussion, patient made an informed decision to proceed with the prescribed plan of care.   Patient education material was dispensed. We discussed premedication with dexamethasone before chemotherapy. She will benefit from genetic testing

## 2022-04-19 NOTE — Telephone Encounter (Signed)
Advised patient, per Dr Berline Lopes, that it is ok to hold Eliquis. Advised patient to stay hydrated as well. Patient states urine from this morning was dark yellow, probably not truly orange but not pink tinged and no blood. Patient had no further concerns at this time.

## 2022-04-19 NOTE — Telephone Encounter (Signed)
Patient called concerned that she may be having a reaction to Eliquis. States that she started having itching, (mostly on her back and arms) over the weekend, and then today has had an episode of orange urine. States she is eating and drinking ok, denies shortness of breath, hives, or other allergic reaction symptoms. Patient states she took her morning dose of Eliquis but doesn't want to take anymore. Patient has appointment to see Dr Berline Lopes tomorrow.

## 2022-04-19 NOTE — Assessment & Plan Note (Signed)
I will adjust the dose of her chemotherapy accordingly

## 2022-04-20 ENCOUNTER — Other Ambulatory Visit: Payer: Self-pay

## 2022-04-20 ENCOUNTER — Encounter: Payer: Self-pay | Admitting: Gynecologic Oncology

## 2022-04-20 ENCOUNTER — Encounter: Payer: Self-pay | Admitting: Hematology and Oncology

## 2022-04-20 ENCOUNTER — Inpatient Hospital Stay (HOSPITAL_BASED_OUTPATIENT_CLINIC_OR_DEPARTMENT_OTHER): Payer: Medicare Other | Admitting: Hematology and Oncology

## 2022-04-20 ENCOUNTER — Inpatient Hospital Stay: Payer: Medicare Other | Attending: Gynecologic Oncology | Admitting: Gynecologic Oncology

## 2022-04-20 ENCOUNTER — Inpatient Hospital Stay: Payer: Medicare Other

## 2022-04-20 ENCOUNTER — Other Ambulatory Visit: Payer: Self-pay | Admitting: Hematology and Oncology

## 2022-04-20 VITALS — BP 142/72 | HR 71 | Temp 97.9°F | Resp 14 | Wt 158.4 lb

## 2022-04-20 VITALS — BP 142/72 | HR 71 | Temp 97.9°F | Resp 16 | Ht 67.0 in | Wt 158.4 lb

## 2022-04-20 DIAGNOSIS — Z5111 Encounter for antineoplastic chemotherapy: Secondary | ICD-10-CM | POA: Diagnosis not present

## 2022-04-20 DIAGNOSIS — C561 Malignant neoplasm of right ovary: Secondary | ICD-10-CM | POA: Insufficient documentation

## 2022-04-20 DIAGNOSIS — R180 Malignant ascites: Secondary | ICD-10-CM | POA: Diagnosis not present

## 2022-04-20 DIAGNOSIS — R748 Abnormal levels of other serum enzymes: Secondary | ICD-10-CM | POA: Insufficient documentation

## 2022-04-20 DIAGNOSIS — Z9079 Acquired absence of other genital organ(s): Secondary | ICD-10-CM | POA: Insufficient documentation

## 2022-04-20 DIAGNOSIS — L299 Pruritus, unspecified: Secondary | ICD-10-CM | POA: Diagnosis not present

## 2022-04-20 DIAGNOSIS — K7460 Unspecified cirrhosis of liver: Secondary | ICD-10-CM | POA: Insufficient documentation

## 2022-04-20 DIAGNOSIS — T451X5A Adverse effect of antineoplastic and immunosuppressive drugs, initial encounter: Secondary | ICD-10-CM | POA: Diagnosis not present

## 2022-04-20 DIAGNOSIS — K5900 Constipation, unspecified: Secondary | ICD-10-CM | POA: Diagnosis not present

## 2022-04-20 DIAGNOSIS — Z90721 Acquired absence of ovaries, unilateral: Secondary | ICD-10-CM | POA: Insufficient documentation

## 2022-04-20 DIAGNOSIS — R630 Anorexia: Secondary | ICD-10-CM | POA: Diagnosis not present

## 2022-04-20 DIAGNOSIS — N1831 Chronic kidney disease, stage 3a: Secondary | ICD-10-CM

## 2022-04-20 DIAGNOSIS — R5383 Other fatigue: Secondary | ICD-10-CM | POA: Insufficient documentation

## 2022-04-20 DIAGNOSIS — Z7189 Other specified counseling: Secondary | ICD-10-CM

## 2022-04-20 DIAGNOSIS — Z86718 Personal history of other venous thrombosis and embolism: Secondary | ICD-10-CM | POA: Diagnosis not present

## 2022-04-20 DIAGNOSIS — R232 Flushing: Secondary | ICD-10-CM | POA: Diagnosis not present

## 2022-04-20 LAB — CMP (CANCER CENTER ONLY)
ALT: 256 U/L — ABNORMAL HIGH (ref 0–44)
AST: 136 U/L — ABNORMAL HIGH (ref 15–41)
Albumin: 3.9 g/dL (ref 3.5–5.0)
Alkaline Phosphatase: 463 U/L — ABNORMAL HIGH (ref 38–126)
Anion gap: 5 (ref 5–15)
BUN: 13 mg/dL (ref 8–23)
CO2: 28 mmol/L (ref 22–32)
Calcium: 9.3 mg/dL (ref 8.9–10.3)
Chloride: 103 mmol/L (ref 98–111)
Creatinine: 1.08 mg/dL — ABNORMAL HIGH (ref 0.44–1.00)
GFR, Estimated: 53 mL/min — ABNORMAL LOW (ref >60)
Glucose, Bld: 113 mg/dL — ABNORMAL HIGH (ref 70–99)
Potassium: 3.9 mmol/L (ref 3.5–5.1)
Sodium: 136 mmol/L (ref 135–145)
Total Bilirubin: 1.4 mg/dL — ABNORMAL HIGH (ref 0.3–1.2)
Total Protein: 6.4 g/dL — ABNORMAL LOW (ref 6.5–8.1)

## 2022-04-20 LAB — CBC WITH DIFFERENTIAL (CANCER CENTER ONLY)
Abs Immature Granulocytes: 0.05 10*3/uL (ref 0.00–0.07)
Basophils Absolute: 0.1 10*3/uL (ref 0.0–0.1)
Basophils Relative: 1 %
Eosinophils Absolute: 0.7 10*3/uL — ABNORMAL HIGH (ref 0.0–0.5)
Eosinophils Relative: 9 %
HCT: 38 % (ref 36.0–46.0)
Hemoglobin: 12.7 g/dL (ref 12.0–15.0)
Immature Granulocytes: 1 %
Lymphocytes Relative: 13 %
Lymphs Abs: 1.1 10*3/uL (ref 0.7–4.0)
MCH: 31.1 pg (ref 26.0–34.0)
MCHC: 33.4 g/dL (ref 30.0–36.0)
MCV: 92.9 fL (ref 80.0–100.0)
Monocytes Absolute: 0.8 10*3/uL (ref 0.1–1.0)
Monocytes Relative: 10 %
Neutro Abs: 5.4 10*3/uL (ref 1.7–7.7)
Neutrophils Relative %: 66 %
Platelet Count: 341 10*3/uL (ref 150–400)
RBC: 4.09 MIL/uL (ref 3.87–5.11)
RDW: 13.5 % (ref 11.5–15.5)
WBC Count: 8.1 10*3/uL (ref 4.0–10.5)
nRBC: 0 % (ref 0.0–0.2)

## 2022-04-20 MED ORDER — ONDANSETRON HCL 8 MG PO TABS: 8.0000 mg | ORAL_TABLET | Freq: Three times a day (TID) | ORAL | 1 refills | Status: DC | PRN

## 2022-04-20 MED ORDER — DEXAMETHASONE 4 MG PO TABS: ORAL_TABLET | ORAL | 6 refills | Status: DC

## 2022-04-20 MED ORDER — PROCHLORPERAZINE MALEATE 10 MG PO TABS: 10.0000 mg | ORAL_TABLET | Freq: Four times a day (QID) | ORAL | 1 refills | Status: DC | PRN

## 2022-04-20 MED ORDER — LIDOCAINE-PRILOCAINE 2.5-2.5 % EX CREA: TOPICAL_CREAM | CUTANEOUS | 3 refills | Status: DC

## 2022-04-20 NOTE — Progress Notes (Addendum)
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 The intent is of curative intent.  We discussed some of the risks, benefits, side-effects of carboplatin & Taxol. Treatment is intravenous, every 3 weeks x 6 cycles  Some of the short term side-effects included, though not limited to, including weight loss, life threatening infections, risk of allergic reactions, need for transfusions of blood products, nausea, vomiting, change in bowel habits, loss of hair, admission to hospital for various reasons, and risks of death.   Long term side-effects are also discussed including risks of infertility, permanent damage to nerve function, hearing loss, chronic fatigue, kidney damage with possibility needing hemodialysis, and rare secondary malignancy including bone marrow disorders.  The patient is aware that the response rates discussed earlier is not guaranteed.  After a long discussion, patient made an informed decision to proceed with the prescribed plan of care.   Patient education material was dispensed. We discussed premedication with dexamethasone before chemotherapy. She will benefit from genetic testing  Stage 3a chronic kidney disease I will adjust the dose of her chemotherapy accordingly  Elevated liver enzymes After the visit, it was noted that her liver enzymes came back abnormal It could be related to recent anesthesia The patient is instructed to hold simvastatin and acetaminophen We will get her CMP rechecked next week, the morning  before chemotherapy and hopefully, her liver enzymes will trend back down to normal  Orders Placed This Encounter  Procedures   CMP ([place] only)    Standing Status:   Future    Standing Expiration Date:   04/21/2023    All questions were answered. The patient knows to call the clinic with any problems, questions or concerns. The total time spent in the appointment was 40 minutes encounter with patients including review of chart and various tests results, discussions about plan of care and coordination of care plan   [name], MD 04/20/2022 2:59 PM  INTERVAL HISTORY: Please see below for problem oriented charting. she returns for treatment follow-up after recent surgery She has lost a lot of weight since surgery but is eating better She had mild constipation but is alleviated by laxatives She complains of recent fatigue We spent majority of our time reviewing side effects of treatment to be expected She is scheduled for genetic counseling next month The plan is for her to receive 6 cycles of chemotherapy  REVIEW OF SYSTEMS:   Constitutional: Denies fevers, chills Eyes: Denies blurriness of vision Ears, nose, mouth, throat, and face: Denies mucositis or sore throat Respiratory: Denies cough, dyspnea or wheezes Cardiovascular: Denies palpitation, chest discomfort or lower extremity swelling Skin: Denies abnormal skin rashes Lymphatics: Denies new lymphadenopathy or easy bruising Neurological:Denies numbness, tingling or new weaknesses Behavioral/Psych: Mood is stable, no new changes  All other systems were reviewed with the patient and are negative.  I have reviewed the past medical history, past surgical history, social history and family history with the patient and they are unchanged from previous note.  ALLERGIES:  is allergic to codeine, nsaids, penicillins, vibra-tab [doxycycline], iodine, and latex.  MEDICATIONS:  Current Outpatient  Medications  Medication Sig  Dispense Refill   dexamethasone (DECADRON) 4 MG tablet Take 2 tabs at the night before and 2 tab the morning of chemotherapy, every 3 weeks, by mouth x 6 cycles 36 tablet 6   acetaminophen (TYLENOL) 500 MG tablet Take 500-1,000 mg by mouth daily as needed for moderate pain, fever or headache.     amLODipine (NORVASC) 5 MG tablet Take 1 tablet (5 mg total) by mouth daily. (Patient taking differently: Take 5 mg by mouth in the morning.) 90 tablet 3   apixaban (ELIQUIS) 2.5 MG TABS tablet Take 1 tablet (2.5 mg total) by mouth 2 (two) times daily for 28 days. For AFTER surgery only, Plan to start taking on Friday, Apr 02, 2022. You can use the tablets you have at home. 56 tablet 0   atenolol (TENORMIN) 25 MG tablet Take 1 tablet (25 mg total) by mouth daily. (Patient taking differently: Take 12.5 mg by mouth 2 (two) times daily.) 90 tablet 3   Calcium Carb-Cholecalciferol (CALCIUM + VITAMIN D3 PO) Take 1 tablet by mouth 2 (two) times daily.     levothyroxine (SYNTHROID) 100 MCG tablet Take 1/2 tablet on Sundays and 1 tablet daily all other days (Patient taking differently: Take 100 mcg by mouth in the morning.) 90 tablet 3   lidocaine-prilocaine (EMLA) cream Apply to affected area once 30 g 3   loratadine (CLARITIN) 10 MG tablet Take 10 mg by mouth at bedtime.     Multiple Vitamin (MULTIVITAMIN) tablet Take 1 tablet by mouth in the morning.     ondansetron (ZOFRAN) 8 MG tablet Take 1 tablet (8 mg total) by mouth every 8 (eight) hours as needed for nausea or vomiting. Start on the third day after chemotherapy. 30 tablet 1   prochlorperazine (COMPAZINE) 10 MG tablet Take 1 tablet (10 mg total) by mouth every 6 (six) hours as needed for nausea or vomiting. 30 tablet 1   senna-docusate (SENOKOT-S) 8.6-50 MG tablet Take 2 tablets by mouth at bedtime. For AFTER surgery, do not take if having diarrhea 30 tablet 0   simvastatin (ZOCOR) 20 MG tablet Take 1 tablet (20 mg total) by mouth at bedtime. Cancel rx for  26m. 90 tablet 3   No current facility-administered medications for this visit.    SUMMARY OF ONCOLOGIC HISTORY: Oncology History Overview Note  High grade papillary serous   [place]  [place]/16/20[place] Imaging   1. Large, mixed solid and cystic mass in the low pelvis, difficult to clearly delineate from adjacent ascites although at least 10.2 x 10.0 cm. This is highly concerning for primary ovarian malignancy and most likely arises from the right ovary, although may involve both ovaries. This may be further evaluated by contrast enhanced MRI of the pelvis if desired. 2. Large volume ascites throughout the abdomen and pelvis, presumed malignant. Stranding and nodularity of the omentum and peritoneum, particularly in the ventral abdomen and left upper quadrant, highly suspicious for peritoneal metastatic disease. 3. Cirrhotic morphology of the liver, which may contribute to ascites. 4. Severely atrophic left kidney, consistent with remote prior infectious, obstructive, or ischemic insult. 5. Coronary artery disease.   Aortic Atherosclerosis (ICD10-I70.0).   03/17/2022 Imaging   1. There is a large solid and cystic mass within the right adnexa which measures up to 9.5 cm in maximum dimension. Findings are concerning for a primary ovarian neoplasm. 2. Large volume of ascites within the abdomen and pelvis. Given the presence of peritoneal nodules  on the CT from the prior day findings are concerning for malignant ascites.   03/19/2022 Procedure   CT-guided biopsy of omental mass.    03/19/2022 Procedure   Successful ultrasound-guided paracentesis yielding 4.2 liters of peritoneal fluid.   03/19/2022 Procedure   Successful placement of a left internal jugular approach power injectable Port-A-Cath. The catheter is ready for immediate use.   03/30/2022 Initial Diagnosis   Ovarian cancer ([place])   03/30/2022 Pathology Results   A. RIGHT OVARY AND FALLOPIAN TUBE, SALPINGO  OOPHORECTOMY: Poorly differentiated carcinoma immunohistochemically most consistent with a high grade serous carcinoma Tumor measures 10.7 x 10.6 x 4.6 cm Tumor involves ovarian and mesosalpinx serosal surfaces and right fallopian tube pT2a pN n/a  Note: The tumor consists of sheets and cords of high-grade nuclei with areas of necrosis and adjacent prominent stroma suggesting papillary cores the overall morphology is slightly unusual and not classic; therefore, a battery of immunohistochemical stains were performed to confirm the diagnosis and exclude a high-grade germ cell tumor.  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 Additionally there is nonspecific focal PLAP positivity; CD117 positivity and D2-40 positivity of uncertain clinical significance.  The tumor is negative for AFP, hCG, CD30, CD31, CK20, glypican-3, and OCT 3/4.  Dr. Saralyn Pilar has peer reviewed the case and agrees with diagnosis. Dr. Alric Seton has also initiated the workup of the case and agrees with the interpretation.  B. OMENTUM, RESECTION: Benign omentum Negative for tumor  C. RIGHT PELVIC SIDEWALL, BIOPSY: Benign mesothelium, fibrovascular stroma and adipose Negative for tumor  D. ANTERIOR CUL DE SAC, BIOPSY: Benign mesothelium Negative for tumor  E. LEFT PELVIC SIDEWALL, BIOPSY: Benign mesothelium and adipose Negative for tumor   ONCOLOGY TABLE:  OVARY or FALLOPIAN TUBE or PRIMARY PERITONEUM: Resection  Procedure: Right salpingo-oophorectomy Specimen Integrity: Grossly focally disrupted Tumor Site: Right ovary Tumor Size: 10.7 x 10.6 x 4.6 cm Histologic Type: Poorly differentiated carcinoma most consistent with a high-grade serous carcinoma Histologic Grade: High-grade Ovarian Surface Involvement: Surface involvement Fallopian Tube Surface Involvement: Mesosalpinx serosal surface involvement and right  fallopian tube involvement Implants (required for advanced stage serous/seromucinous borderline tumors only): Not identified Other Tissue/ Organ Involvement: N/A Largest Extrapelvic Peritoneal Focus: N/A Peritoneal/Ascitic Fluid Involvement: N/A Chemotherapy Response Score (CRS): N/A, no known presurgical therapy Regional Lymph Nodes: N/A, no lymph nodes submitted Distant Metastasis:      Distant Site(s) Involved: N/A Pathologic Stage Classification (pTNM, AJCC 8th Edition): pT2a, pN N/A Ancillary Studies: Can be performed on physician request Representative Tumor Block: A8 Comment(s): [None]    03/30/2022 Surgery   Preop Diagnosis: Pelvic mass, malignant ascites   Postoperative Diagnosis: at least stage IIB ovarian cancer, suspected high grade serous by frozen section   Surgery: Diagnostic laparoscopy, right salpingo-oophorectomy, peritoneal biopsies, infra-colic omentectomy    Surgeon:  Valarie Cones MD    Operative findings: On exam under anesthesia, somewhat mobile mass appreciated in the cul-de-sac to the right.  On agnostic laparoscopy, normal upper abdominal findings other than moderate volume ascites.  Normal-appearing peritoneum, omentum, small bowel.  Right adnexa enlarged with a mass measuring approximately 8-10 cm.  No carcinomatosis appreciated.  Given this, decision made to convert to an open procedure for tumor debulking.  Approximately 5 L of yellow-tinged ascites was removed upon intra-abdominal entry.  Normal upper abdominal survey including smooth diaphragm and liver, stomach, and omentum.  The small bowel was run from  the cecum to the ligament of Treitz with no abnormalities or carcinomatosis noted.  Right ovary replaced by an approximately 8 cm smooth mass, dilated and enlarged fallopian tube.  The mass itself was adherent to the right pelvic sidewall, sigmoid colon, and densely adherent to the anterior cul-de-sac/bladder peritoneum.  Frozen section consistent with invasive  carcinoma, likely high-grade.  Left tube and ovary were surgically absent.  Sigmoid colon with adhesions to the left pelvic sidewall, left aspect of the bladder and the left vaginal cuff.  Somewhat redundant sigmoid which looped over on itself secondary to these adhesions.  Numerous diverticula noted of the sigmoid colon as well as the transverse colon.  The end of surgery, an R0 resection was accomplished.     04/13/2022 Cancer Staging   Staging form: Ovary, Fallopian Tube, and Primary Peritoneal Carcinoma, AJCC 8th Edition - Pathologic stage from 04/13/2022: Stage II (pT2, pN0, cM0) - Signed by [name], MD on 04/13/2022 Stage prefix: Initial diagnosis   04/26/2022 -  Chemotherapy   Patient is on Treatment Plan : OVARIAN Carboplatin (AUC 6) + Paclitaxel (175) q21d X 6 Cycles       PHYSICAL EXAMINATION: ECOG PERFORMANCE STATUS: 1 - Symptomatic but completely ambulatory  Vitals:   04/20/22 1257  BP: (!) 142/72  Pulse: 71  Resp: 16  Temp: 97.9 F (36.6 C)  SpO2: 100%   Filed Weights   04/20/22 1257  Weight: 158 lb 6.4 oz (71.8 kg)    GENERAL:alert, no distress and comfortable SKIN: skin color, texture, turgor are normal, no rashes or significant lesions EYES: normal, Conjunctiva are pink and non-injected, sclera clear OROPHARYNX:no exudate, no erythema and lips, buccal mucosa, and tongue normal  NECK: supple, thyroid normal size, non-tender, without nodularity LYMPH:  no palpable lymphadenopathy in the cervical, axillary or inguinal LUNGS: clear to auscultation and percussion with normal breathing effort HEART: regular rate & rhythm and no murmurs and no lower extremity edema ABDOMEN:abdomen soft, non-tender and normal bowel sounds.  Noted well-healed surgical scar Musculoskeletal:no cyanosis of digits and no clubbing  NEURO: alert & oriented x 3 with fluent speech, no focal motor/sensory deficits  LABORATORY DATA:  I have reviewed the data as listed    Component Value  Date/Time   NA 136 04/20/2022 1406   NA 135 11/05/2021 0810   K 3.9 04/20/2022 1406   CL 103 04/20/2022 1406   CO2 28 04/20/2022 1406   GLUCOSE 113 (H) 04/20/2022 1406   BUN 13 04/20/2022 1406   BUN 11 11/05/2021 0810   CREATININE 1.08 (H) 04/20/2022 1406   CREATININE 0.99 02/05/2013 0757   CALCIUM 9.3 04/20/2022 1406   PROT 6.4 (L) 04/20/2022 1406   PROT 6.8 05/13/2021 0813   ALBUMIN 3.9 04/20/2022 1406   ALBUMIN 4.3 11/05/2021 0810   AST 136 (H) 04/20/2022 1406   ALT 256 (H) 04/20/2022 1406   ALKPHOS 463 (H) 04/20/2022 1406   BILITOT 1.4 (H) 04/20/2022 1406   GFRNONAA 53 (L) 04/20/2022 1406   GFRNONAA 59 (L) 02/05/2013 0757   GFRAA 61 05/20/2020 0850   GFRAA 68 02/05/2013 0757    No results found for: "SPEP", "UPEP"  Lab Results  Component Value Date   WBC 8.1 04/20/2022   NEUTROABS 5.4 04/20/2022   HGB 12.7 04/20/2022   HCT 38.0 04/20/2022   MCV 92.9 04/20/2022   PLT 341 04/20/2022      Chemistry      Component Value Date/Time   NA 136 04/20/2022 1406  NA 135 11/05/2021 0810   K 3.9 04/20/2022 1406   CL 103 04/20/2022 1406   CO2 28 04/20/2022 1406   BUN 13 04/20/2022 1406   BUN 11 11/05/2021 0810   CREATININE 1.08 (H) 04/20/2022 1406   CREATININE 0.99 02/05/2013 0757      Component Value Date/Time   CALCIUM 9.3 04/20/2022 1406   ALKPHOS 463 (H) 04/20/2022 1406   AST 136 (H) 04/20/2022 1406   ALT 256 (H) 04/20/2022 1406   BILITOT 1.4 (H) 04/20/2022 1406

## 2022-04-20 NOTE — Patient Instructions (Signed)
You are healing very well.  Remember, no heavy lifting for at least 6 weeks after surgery and nothing in the vagina for at least 8-10.

## 2022-04-20 NOTE — Progress Notes (Signed)
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 Large, mixed solid and cystic mass in the low pelvis, difficult to clearly delineate from adjacent ascites although at least 10.2 x 10.0 cm. This is highly concerning for primary ovarian malignancy and most likely arises from the right ovary, although may involve both ovaries. This may be further evaluated by contrast enhanced MRI of the pelvis if desired. 2. Large volume ascites throughout the abdomen and pelvis, presumed malignant. Stranding and nodularity of the omentum and peritoneum, particularly in the ventral abdomen and left upper quadrant, highly suspicious for peritoneal metastatic disease. 3. Cirrhotic morphology of the liver, which may contribute to ascites. 4. Severely atrophic left kidney, consistent with remote prior infectious, obstructive, or ischemic insult. 5. Coronary artery disease.   Aortic Atherosclerosis (ICD10-I70.0).   03/17/2022 Imaging   1. There is a large solid and cystic mass within the right adnexa which measures up to 9.5 cm in maximum dimension. Findings are concerning for a primary ovarian neoplasm. 2. Large volume of ascites within the abdomen and pelvis. Given the presence of peritoneal nodules on the CT from the prior day findings are concerning for malignant ascites.   03/19/2022 Procedure   CT-guided biopsy of omental mass.    03/19/2022 Procedure   Successful ultrasound-guided paracentesis yielding 4.2 liters of peritoneal fluid.   03/19/2022 Procedure   Successful placement of a left internal jugular approach power injectable Port-A-Cath. The catheter is ready for immediate use.   03/30/2022 Initial Diagnosis   Ovarian cancer ([place])   03/30/2022 Pathology Results   A. RIGHT OVARY AND  FALLOPIAN TUBE, SALPINGO OOPHORECTOMY: Poorly differentiated carcinoma immunohistochemically most consistent with a high grade serous carcinoma Tumor measures 10.7 x 10.6 x 4.6 cm Tumor involves ovarian and mesosalpinx serosal surfaces and right fallopian tube pT2a pN n/a  Note: The tumor consists of sheets and cords of high-grade nuclei with areas of necrosis and adjacent prominent stroma suggesting papillary cores the overall morphology is slightly unusual and not classic; therefore, a battery of immunohistochemical stains were performed to confirm the diagnosis and exclude a high-grade germ cell tumor.  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 Additionally there is nonspecific focal PLAP positivity; CD117 positivity and D2-40 positivity of uncertain clinical significance.  The tumor is negative for AFP, hCG, CD30, CD31, CK20, glypican-3, and OCT 3/4.  Dr. Saralyn Pilar has peer reviewed the case and agrees with diagnosis. Dr. Alric Seton has also initiated the workup of the case and agrees with the interpretation.  B. OMENTUM, RESECTION: Benign omentum Negative for tumor  C. RIGHT PELVIC SIDEWALL, BIOPSY: Benign mesothelium, fibrovascular stroma and adipose Negative for tumor  D. ANTERIOR CUL DE SAC, BIOPSY: Benign mesothelium Negative for tumor  E. LEFT PELVIC SIDEWALL, BIOPSY: Benign mesothelium and adipose Negative for tumor   ONCOLOGY TABLE:  OVARY or FALLOPIAN TUBE or PRIMARY PERITONEUM: Resection  Procedure: Right salpingo-oophorectomy Specimen Integrity: Grossly focally disrupted Tumor Site: Right ovary Tumor Size: 10.7 x 10.6 x 4.6 cm Histologic Type: Poorly differentiated carcinoma most consistent with a high-grade serous carcinoma Histologic Grade: High-grade Ovarian Surface Involvement: Surface involvement Fallopian Tube Surface Involvement: Mesosalpinx serosal  surface involvement and right fallopian tube involvement Implants (required for advanced stage  serous/seromucinous borderline tumors only): Not identified Other Tissue/ Organ Involvement: N/A Largest Extrapelvic Peritoneal Focus: N/A Peritoneal/Ascitic Fluid Involvement: N/A Chemotherapy Response Score (CRS): N/A, no known presurgical therapy Regional Lymph Nodes: N/A, no lymph nodes submitted Distant Metastasis:      Distant Site(s) Involved: N/A Pathologic Stage Classification (pTNM, AJCC 8th Edition): pT2a, pN N/A Ancillary Studies: Can be performed on physician request Representative Tumor Block: A8 Comment(s): [None]    03/30/2022 Surgery   Preop Diagnosis: Pelvic mass, malignant ascites   Postoperative Diagnosis: at least stage IIB ovarian cancer, suspected high grade serous by frozen section   Surgery: Diagnostic laparoscopy, right salpingo-oophorectomy, peritoneal biopsies, infra-colic omentectomy    Surgeon:  Valarie Cones MD    Operative findings: On exam under anesthesia, somewhat mobile mass appreciated in the cul-de-sac to the right.  On agnostic laparoscopy, normal upper abdominal findings other than moderate volume ascites.  Normal-appearing peritoneum, omentum, small bowel.  Right adnexa enlarged with a mass measuring approximately 8-10 cm.  No carcinomatosis appreciated.  Given this, decision made to convert to an open procedure for tumor debulking.  Approximately 5 L of yellow-tinged ascites was removed upon intra-abdominal entry.  Normal upper abdominal survey including smooth diaphragm and liver, stomach, and omentum.  The small bowel was run from the cecum to the ligament of Treitz with no abnormalities or carcinomatosis noted.  Right ovary replaced by an approximately 8 cm smooth mass, dilated and enlarged fallopian tube.  The mass itself was adherent to the right pelvic sidewall, sigmoid colon, and densely adherent to the anterior cul-de-sac/bladder peritoneum.  Frozen  section consistent with invasive carcinoma, likely high-grade.  Left tube and ovary were surgically absent.  Sigmoid colon with adhesions to the left pelvic sidewall, left aspect of the bladder and the left vaginal cuff.  Somewhat redundant sigmoid which looped over on itself secondary to these adhesions.  Numerous diverticula noted of the sigmoid colon as well as the transverse colon.  The end of surgery, an R0 resection was accomplished.     04/13/2022 Cancer Staging   Staging form: Ovary, Fallopian Tube, and Primary Peritoneal Carcinoma, AJCC 8th Edition - Pathologic stage from 04/13/2022: Stage II (pT2, pN0, cM0) - Signed by [name], MD on 04/13/2022 Stage prefix: Initial diagnosis   04/26/2022 -  Chemotherapy   Patient is on Treatment Plan : OVARIAN Carboplatin (AUC 6) + Paclitaxel (175) q21d X 6 Cycles       Interval History: Doing well.  Still has some fatigue as well as decreased appetite.  Reports normal bowel function.  Denies any urinary symptoms.  Denies any significant abdominal or pelvic pain.  Stopped her Eliquis given concern she might be having a reaction but thinks itching is unrelated and urine is just mildly more yellow than normal.  Past Medical/Surgical History: Past Medical History:  Diagnosis Date   Arthritis    Breast cancer ([place]) 1980   RIGHT   Chronic kidney disease    has one kidney   DVT (deep venous thrombosis) ([facility])    on RIGHT leg-hx of   Dyspnea    Hyperlipidemia    on meds   Hypertension    on meds   Osteopenia    Peripheral vascular disease ([facility])    PONV (postoperative nausea and vomiting)    Seasonal allergies    Thyroid disease    on meds    Past Surgical History:  Procedure Laterality Date   ABDOMINAL HYSTERECTOMY     APPENDECTOMY  BREAST BIOPSY     BREAST LUMPECTOMY WITH RADIOACTIVE SEED LOCALIZATION Left 12/01/2021   Procedure: LEFT BREAST RADIOACTIVE SEED GUIDED LUMPECTOMY;  Surgeon: Coralie Keens, MD;  Location: [facility];  Service: General;  Laterality: Left;  LMA   COLONOSCOPY  2011   Dr.[email or web address]-normal   DEBULKING N/A 03/30/2022   Procedure: TUMOR DEBULKING;  Surgeon: Lafonda Mosses, MD;  Location: [facility];  Service: Gynecology;  Laterality: N/A;   EYE SURGERY     76 years old   IR IMAGING GUIDED PORT INSERTION  03/19/2022   LAPAROSCOPY N/A 03/30/2022   Procedure: LAPAROSCOPY DIAGNOSTIC;  Surgeon: Lafonda Mosses, MD;  Location: [facility];  Service: Gynecology;  Laterality: N/A;   MASTECTOMY Right 1984   ROTATOR CUFF REPAIR     SALPINGOOPHORECTOMY Bilateral 03/30/2022   Procedure: OPEN SALPINGO OOPHORECTOMY;  Surgeon: Lafonda Mosses, MD;  Location: [facility];  Service: Gynecology;  Laterality: Bilateral;   THYROID SURGERY Bilateral 1967   VARICOSE VEIN SURGERY     ablation   WISDOM TOOTH EXTRACTION      Family History  Problem Relation Age of Onset   Heart attack Mother    Alcohol abuse Mother    Brain cancer Mother    Lung cancer Mother    Breast cancer Maternal Aunt    Colon polyps Neg Hx    Colon cancer Neg Hx    Esophageal cancer Neg Hx    Stomach cancer Neg Hx    Rectal cancer Neg Hx     Social History   Socioeconomic History   Marital status: Single    Spouse name: Divorced    Number of children: 1   Years of education: Not on file   Highest education level: Not on file  Occupational History   Occupation: Retired    Comment: worked at [employer] in [employer]  Tobacco Use   Smoking status: Former    Packs/day: 0.25    Types: Cigarettes    Quit date: 05/11/1967    Years since quitting: 54.9   Smokeless tobacco: Never  Vaping Use   Vaping Use: Never used  Substance and Sexual Activity   Alcohol use: No   Drug use: No   Sexual activity: Not Currently    Birth control/protection: Post-menopausal, Surgical    Comment: hyst  Other Topics Concern   Not on file  Social History Narrative   Lives alone - very close with her  neighbors   Social Determinants of Health   Financial Resource Strain: Low Risk  (04/08/2022)   Overall Financial Resource Strain (CARDIA)    Difficulty of Paying Living Expenses: Not very hard  Food Insecurity: No Food Insecurity (03/31/2022)   Hunger Vital Sign    Worried About Running Out of Food in the Last Year: Never true    Ran Out of Food in the Last Year: Never true  Transportation Needs: No Transportation Needs (04/08/2022)   PRAPARE - Hydrologist (Medical): No    Lack of Transportation (Non-Medical): No  Physical Activity: Sufficiently Active (08/26/2021)   Exercise Vital Sign    Days of Exercise per Week: 7 days    Minutes of Exercise per Session: 30 min  Stress: No Stress Concern Present (08/26/2021)   [place]    Feeling of Stress : Not at all  Social Connections: Moderately Integrated (08/26/2021)   Social Connection and Isolation Panel [  NHANES]    Frequency of Communication with Friends and Family: More than three times a week    Frequency of Social Gatherings with Friends and Family: More than three times a week    Attends Religious Services: More than 4 times per year    Active Member of [employer] or Organizations: Yes    Attends Music therapist: More than 4 times per year    Marital Status: Divorced    Current Medications:  Current Outpatient Medications:    acetaminophen (TYLENOL) 500 MG tablet, Take 500-1,000 mg by mouth daily as needed for moderate pain, fever or headache., Disp: , Rfl:    amLODipine (NORVASC) 5 MG tablet, Take 1 tablet (5 mg total) by mouth daily. (Patient taking differently: Take 5 mg by mouth in the morning.), Disp: 90 tablet, Rfl: 3   apixaban (ELIQUIS) 2.5 MG TABS tablet, Take 1 tablet (2.5 mg total) by mouth 2 (two) times daily for 28 days. For AFTER surgery only, Plan to start taking on Friday, Apr 02, 2022. You can use the tablets  you have at home., Disp: 56 tablet, Rfl: 0   atenolol (TENORMIN) 25 MG tablet, Take 1 tablet (25 mg total) by mouth daily. (Patient taking differently: Take 12.5 mg by mouth 2 (two) times daily.), Disp: 90 tablet, Rfl: 3   Calcium Carb-Cholecalciferol (CALCIUM + VITAMIN D3 PO), Take 1 tablet by mouth 2 (two) times daily., Disp: , Rfl:    dexamethasone (DECADRON) 4 MG tablet, Take 2 tabs at the night before and 2 tab the morning of chemotherapy, every 3 weeks, by mouth x 6 cycles, Disp: 36 tablet, Rfl: 6   levothyroxine (SYNTHROID) 100 MCG tablet, Take 1/2 tablet on Sundays and 1 tablet daily all other days (Patient taking differently: Take 100 mcg by mouth in the morning.), Disp: 90 tablet, Rfl: 3   lidocaine-prilocaine (EMLA) cream, Apply to affected area once, Disp: 30 g, Rfl: 3   loratadine (CLARITIN) 10 MG tablet, Take 10 mg by mouth at bedtime., Disp: , Rfl:    Multiple Vitamin (MULTIVITAMIN) tablet, Take 1 tablet by mouth in the morning., Disp: , Rfl:    ondansetron (ZOFRAN) 8 MG tablet, Take 1 tablet (8 mg total) by mouth every 8 (eight) hours as needed for nausea or vomiting. Start on the third day after chemotherapy., Disp: 30 tablet, Rfl: 1   prochlorperazine (COMPAZINE) 10 MG tablet, Take 1 tablet (10 mg total) by mouth every 6 (six) hours as needed for nausea or vomiting., Disp: 30 tablet, Rfl: 1   senna-docusate (SENOKOT-S) 8.6-50 MG tablet, Take 2 tablets by mouth at bedtime. For AFTER surgery, do not take if having diarrhea, Disp: 30 tablet, Rfl: 0   simvastatin (ZOCOR) 20 MG tablet, Take 1 tablet (20 mg total) by mouth at bedtime. Cancel rx for 36m., Disp: 90 tablet, Rfl: 3  Review of Systems: Denies appetite changes, fevers, chills, fatigue, unexplained weight changes. Denies hearing loss, neck lumps or masses, mouth sores, ringing in ears or voice changes. Denies cough or wheezing.  Denies shortness of breath. Denies chest pain or palpitations. Denies leg swelling. Denies  abdominal distention, pain, blood in stools, constipation, diarrhea, nausea, vomiting, or early satiety. Denies pain with intercourse, dysuria, frequency, hematuria or incontinence. Denies hot flashes, pelvic pain, vaginal bleeding or vaginal discharge.   Denies joint pain, back pain or muscle pain/cramps. Denies itching, rash, or wounds. Denies dizziness, headaches, numbness or seizures. Denies swollen lymph nodes or glands, denies easy bruising or  bleeding. Denies anxiety, depression, confusion, or decreased concentration.  Physical Exam: BP (!) 142/72 (Patient Position: Sitting)   Pulse 71   Temp 97.9 F (36.6 C)   Resp 14   Wt 158 lb 6.4 oz (71.8 kg)   SpO2 100%   BMI 24.81 kg/m  General: Alert, oriented, no acute distress. HEENT: Normocephalic, atraumatic, sclera anicteric. Chest: Unlabored breathing on room air. Abdomen: soft, nontender.  Normoactive bowel sounds.  No masses or hepatosplenomegaly appreciated.  Well-healed scar, remaining Dermabond removed. Extremities: Grossly normal range of motion.  Warm, well perfused.  No edema bilaterally. Skin: No rashes or lesions noted.  Laboratory & Radiologic Studies: None new  Assessment & Plan: April Weber is a 76 y.o. 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  Patient is doing well from a postoperative standpoint.  Meeting milestones.  Discussed continued expectations.  She stopped Eliquis yesterday given concern for possible reaction.  I do not think that she is having a reaction and recommended that she restart the Eliquis.  She describes some darkness to her urine although not hematuria.  She is getting blood work today to assess kidney function as well as LFTs.  She is scheduled for appointment with genetics in January.  Reviewed pathology from surgery.  The patient was given a copy of her report.  We discussed again plan for adjuvant  chemotherapy.  The patient met with Dr. Alvy Bimler prior to her visit with me.  Plan will be for 6 cycles of adjuvant carboplatin and paclitaxel with repeat imaging after 6 cycles.  26 minutes of total time was spent for this patient encounter, including preparation, face-to-face counseling with the patient and coordination of care, and documentation of the encounter.  Jeral Pinch, MD  Division of Gynecologic Oncology  Department of Obstetrics and Gynecology  [facility]

## 2022-04-20 NOTE — Addendum Note (Signed)
Addended byAlvy Bimler, Colman Birdwell on: 04/20/2022 02:59 PM   Modules accepted: Orders

## 2022-04-20 NOTE — Assessment & Plan Note (Signed)
After the visit, it was noted that her liver enzymes came back abnormal It could be related to recent anesthesia The patient is instructed to hold simvastatin and acetaminophen We will get her CMP rechecked next week, the morning before chemotherapy and hopefully, her liver enzymes will trend back down to normal

## 2022-04-23 ENCOUNTER — Encounter: Payer: Self-pay | Admitting: Hematology and Oncology

## 2022-04-23 MED FILL — Fosaprepitant Dimeglumine For IV Infusion 150 MG (Base Eq): INTRAVENOUS | Qty: 5 | Status: AC

## 2022-04-23 MED FILL — Dexamethasone Sodium Phosphate Inj 100 MG/10ML: INTRAMUSCULAR | Qty: 1 | Status: AC

## 2022-04-23 NOTE — Progress Notes (Signed)
Called pt to introduce myself as her Arboriculturist and to discuss the [employer].  I went over what it covers and how it works but she declined it at this time stating she may want to apply at a later time.  I informed her when the grant is available to her, she verbalized understanding.  I requested for the registration staff leave her my card in case she changes her mind and for any questions or concerns she may have in the future.

## 2022-04-26 ENCOUNTER — Inpatient Hospital Stay (HOSPITAL_BASED_OUTPATIENT_CLINIC_OR_DEPARTMENT_OTHER): Payer: Medicare Other | Admitting: Physician Assistant

## 2022-04-26 ENCOUNTER — Inpatient Hospital Stay: Payer: Medicare Other

## 2022-04-26 ENCOUNTER — Encounter: Payer: Self-pay | Admitting: Hematology and Oncology

## 2022-04-26 ENCOUNTER — Other Ambulatory Visit: Payer: Self-pay

## 2022-04-26 ENCOUNTER — Other Ambulatory Visit: Payer: Self-pay | Admitting: Hematology and Oncology

## 2022-04-26 VITALS — BP 158/80 | HR 98 | Temp 97.7°F | Resp 18 | Ht 67.0 in | Wt 157.8 lb

## 2022-04-26 DIAGNOSIS — C561 Malignant neoplasm of right ovary: Secondary | ICD-10-CM

## 2022-04-26 DIAGNOSIS — R232 Flushing: Secondary | ICD-10-CM | POA: Diagnosis not present

## 2022-04-26 DIAGNOSIS — R748 Abnormal levels of other serum enzymes: Secondary | ICD-10-CM

## 2022-04-26 DIAGNOSIS — Z5111 Encounter for antineoplastic chemotherapy: Secondary | ICD-10-CM | POA: Diagnosis not present

## 2022-04-26 DIAGNOSIS — R630 Anorexia: Secondary | ICD-10-CM | POA: Diagnosis not present

## 2022-04-26 DIAGNOSIS — T451X5A Adverse effect of antineoplastic and immunosuppressive drugs, initial encounter: Secondary | ICD-10-CM

## 2022-04-26 DIAGNOSIS — R180 Malignant ascites: Secondary | ICD-10-CM | POA: Diagnosis not present

## 2022-04-26 DIAGNOSIS — L299 Pruritus, unspecified: Secondary | ICD-10-CM | POA: Diagnosis not present

## 2022-04-26 LAB — CMP (CANCER CENTER ONLY)
ALT: 104 U/L — ABNORMAL HIGH (ref 0–44)
AST: 43 U/L — ABNORMAL HIGH (ref 15–41)
Albumin: 4 g/dL (ref 3.5–5.0)
Alkaline Phosphatase: 416 U/L — ABNORMAL HIGH (ref 38–126)
Anion gap: 9 (ref 5–15)
BUN: 20 mg/dL (ref 8–23)
CO2: 24 mmol/L (ref 22–32)
Calcium: 9.7 mg/dL (ref 8.9–10.3)
Chloride: 102 mmol/L (ref 98–111)
Creatinine: 1.09 mg/dL — ABNORMAL HIGH (ref 0.44–1.00)
GFR, Estimated: 52 mL/min — ABNORMAL LOW (ref >60)
Glucose, Bld: 204 mg/dL — ABNORMAL HIGH (ref 70–99)
Potassium: 4 mmol/L (ref 3.5–5.1)
Sodium: 135 mmol/L (ref 135–145)
Total Bilirubin: 0.7 mg/dL (ref 0.3–1.2)
Total Protein: 6.6 g/dL (ref 6.5–8.1)

## 2022-04-26 MED ORDER — SODIUM CHLORIDE 0.9 % IV SOLN: Freq: Once | INTRAVENOUS | Status: AC

## 2022-04-26 MED ORDER — SODIUM CHLORIDE 0.9 % IV SOLN
150.0000 mg | Freq: Once | INTRAVENOUS | Status: AC
  Administered 2022-04-26: 150 mg via INTRAVENOUS
  Filled 2022-04-26: qty 150

## 2022-04-26 MED ORDER — SODIUM CHLORIDE 0.9 % IV SOLN
444.6000 mg | Freq: Once | INTRAVENOUS | Status: AC
  Administered 2022-04-26: 440 mg via INTRAVENOUS
  Filled 2022-04-26: qty 44

## 2022-04-26 MED ORDER — SODIUM CHLORIDE 0.9 % IV SOLN
10.0000 mg | Freq: Once | INTRAVENOUS | Status: AC
  Administered 2022-04-26: 10 mg via INTRAVENOUS
  Filled 2022-04-26: qty 10

## 2022-04-26 MED ORDER — DIPHENHYDRAMINE HCL 50 MG/ML IJ SOLN
50.0000 mg | Freq: Once | INTRAMUSCULAR | Status: AC | PRN
  Administered 2022-04-26: 25 mg via INTRAVENOUS

## 2022-04-26 MED ORDER — HEPARIN SOD (PORK) LOCK FLUSH 100 UNIT/ML IV SOLN
500.0000 [IU] | Freq: Once | INTRAVENOUS | Status: AC | PRN
  Administered 2022-04-26: 500 [IU]

## 2022-04-26 MED ORDER — SODIUM CHLORIDE 0.9% FLUSH
10.0000 mL | INTRAVENOUS | Status: DC | PRN
  Administered 2022-04-26: 10 mL

## 2022-04-26 MED ORDER — PALONOSETRON HCL INJECTION 0.25 MG/5ML
0.2500 mg | Freq: Once | INTRAVENOUS | Status: AC
  Administered 2022-04-26: 0.25 mg via INTRAVENOUS
  Filled 2022-04-26: qty 5

## 2022-04-26 MED ORDER — METHYLPREDNISOLONE SODIUM SUCC 125 MG IJ SOLR
125.0000 mg | Freq: Once | INTRAMUSCULAR | Status: AC | PRN
  Administered 2022-04-26: 125 mg via INTRAVENOUS

## 2022-04-26 MED ORDER — FAMOTIDINE IN NACL 20-0.9 MG/50ML-% IV SOLN
20.0000 mg | Freq: Once | INTRAVENOUS | Status: AC
  Administered 2022-04-26: 20 mg via INTRAVENOUS
  Filled 2022-04-26: qty 50

## 2022-04-26 MED ORDER — CETIRIZINE HCL 10 MG/ML IV SOLN
10.0000 mg | Freq: Once | INTRAVENOUS | Status: AC
  Administered 2022-04-26: 10 mg via INTRAVENOUS
  Filled 2022-04-26: qty 1

## 2022-04-26 MED ORDER — FAMOTIDINE IN NACL 20-0.9 MG/50ML-% IV SOLN
20.0000 mg | Freq: Once | INTRAVENOUS | Status: AC | PRN
  Administered 2022-04-26: 20 mg via INTRAVENOUS

## 2022-04-26 MED ORDER — SODIUM CHLORIDE 0.9 % IV SOLN
140.0000 mg/m2 | Freq: Once | INTRAVENOUS | Status: AC
  Administered 2022-04-26: 258 mg via INTRAVENOUS
  Filled 2022-04-26: qty 43

## 2022-04-26 MED ORDER — SODIUM CHLORIDE 0.9 % IV SOLN: Freq: Once | INTRAVENOUS | Status: DC | PRN

## 2022-04-26 NOTE — Progress Notes (Signed)
Hypersensitivity Reaction note  Date of event: 04/26/22 Time of event: 1058 Generic name of drug involved: Paclitaxel Name of provider notified of the hypersensitivity reaction: Anda Kraft PA and Dr. Alvy Bimler Was agent that likely caused hypersensitivity reaction added to Allergies List within EMR? Yes Chain of events including reaction signs/symptoms, treatment administered, and outcome (e.g., drug resumed; drug discontinued; sent to Emergency Department; etc.) at 1058 Pt reported feeling warm and that her ears were itching. Pt appeared flushed. Paclitaxel stopped. Normal Saline hung to gravity. Anda Kraft PA called to bedside. Benadryl, Pepcid and Solumedrol given (see MAR for times). BP dropped from baseline, she reports feeling light headed and pt also reported feeling a lump in her throat. Feet elevated and pt reclined. 1115 BP returned to baseline.  Dr. Alvy Bimler came to discuss plan of care with pt. Plan is to proceed with Carboplatin today. Pt will receive Docetaxel at next visit in three weeks. Pt agreeable with plan   Charlaine Dalton, RN 04/26/2022 12:03 PM

## 2022-04-26 NOTE — Progress Notes (Signed)
DATE:  04/26/22                                        X CHEMO/IMMUNOTHERAPY REACTION           MD: Lurlean Leyden   AGENT/BLOOD PRODUCT RECEIVING TODAY:              Taxol and carbo   AGENT/BLOOD PRODUCT RECEIVING IMMEDIATELY PRIOR TO REACTION:          taxol   VS: BP:     133/59   P:       99       SPO2:       99% RA                BP:     104/47   P:       86       SPO2:       99 % RA   BP: 131/83  P:       86       SPO2:     100% RA    REACTION(S):           flushing, itching, throat tightness   PREMEDS:     aloxi 0.25 mg IV, cetirizine 10 mg IV, pepcid, decadron 10 mg IV, emend 150 mg IV   INTERVENTION: Pepcid 20 mg IV, solumedrol 125 mg IV, Benadryl '25mg'$  IV, IVF   Review of Systems  Review of Systems   Physical Exam  Physical Exam Vitals and nursing note reviewed.  Constitutional:      Appearance: She is well-developed. She is ill-appearing. She is not toxic-appearing.     Comments: Facial flushing  HENT:     Head: Normocephalic.     Nose: Nose normal.     Mouth/Throat:     Mouth: Mucous membranes are moist.     Pharynx: Oropharynx is clear.  Eyes:     Conjunctiva/sclera: Conjunctivae normal.  Neck:     Vascular: No JVD.  Cardiovascular:     Rate and Rhythm: Normal rate and regular rhythm.     Pulses: Normal pulses.     Heart sounds: Normal heart sounds.  Pulmonary:     Effort: Pulmonary effort is normal. No respiratory distress.     Breath sounds: Normal breath sounds. No stridor. No wheezing, rhonchi or rales.  Chest:     Chest wall: No tenderness.  Abdominal:     General: There is no distension.  Musculoskeletal:     Cervical back: Normal range of motion.  Skin:    General: Skin is warm and dry.  Neurological:     Mental Status: She is oriented to person, place, and time.     OUTCOME:                Patient with severe taxol reaction approximately 15 minutes into treatment. Emergency medication given as above and symptoms resolved. Taxol will be  discontinued and treatment will be changed to Taxotere by MD. She will have carboplatin today. Patient closely monitored for remainder of treatment. Patient evaluated by MD as well.   I have spent a total of 20 minutes minutes of face-to-face and non-face-to-face time, preparing to see the patient, obtaining and/or reviewing separately obtained history, performing a medically appropriate examination, counseling and educating the patient, ordering medications, referring and communicating with other health  care professionals, documenting clinical information in the electronic health record, and care coordination.

## 2022-04-26 NOTE — Patient Instructions (Signed)
[facility]  Discharge Instructions: Thank you for choosing [facility] to provide your oncology and hematology care.   If you have a lab appointment with the [facility], please go directly to the [facility] and check in at the registration area.   Wear comfortable clothing and clothing appropriate for easy access to any Portacath or PICC line.   We strive to give you quality time with your provider. You may need to reschedule your appointment if you arrive late (15 or more minutes).  Arriving late affects you and other patients whose appointments are after yours.  Also, if you miss three or more appointments without notifying the office, you may be dismissed from the clinic at the provider's discretion.      For prescription refill requests, have your pharmacy contact our office and allow 72 hours for refills to be completed.    Today you received the following chemotherapy and/or immunotherapy agents Paclitaxel and  Carboplatin    To help prevent nausea and vomiting after your treatment, we encourage you to take your nausea medication as directed.  BELOW ARE SYMPTOMS THAT SHOULD BE REPORTED IMMEDIATELY: *FEVER GREATER THAN 100.4 F (38 C) OR HIGHER *CHILLS OR SWEATING *NAUSEA AND VOMITING THAT IS NOT CONTROLLED WITH YOUR NAUSEA MEDICATION *UNUSUAL SHORTNESS OF BREATH *UNUSUAL BRUISING OR BLEEDING *URINARY PROBLEMS (pain or burning when urinating, or frequent urination) *BOWEL PROBLEMS (unusual diarrhea, constipation, pain near the anus) TENDERNESS IN MOUTH AND THROAT WITH OR WITHOUT PRESENCE OF ULCERS (sore throat, sores in mouth, or a toothache) UNUSUAL RASH, SWELLING OR PAIN  UNUSUAL VAGINAL DISCHARGE OR ITCHING   Items with * indicate a potential emergency and should be followed up as soon as possible or go to the Emergency Department if any problems should occur.  Please show the CHEMOTHERAPY ALERT CARD or IMMUNOTHERAPY ALERT CARD  at check-in to the Emergency Department and triage nurse.  Should you have questions after your visit or need to cancel or reschedule your appointment, please contact [facility]  Dept: [phone]  and follow the prompts.  Office hours are 8:00 a.m. to 4:30 p.m. Monday - Friday. Please note that voicemails left after 4:00 p.m. may not be returned until the following business day.  We are closed weekends and major holidays. You have access to a nurse at all times for urgent questions. Please call the main number to the clinic Dept: [phone] and follow the prompts.   For any non-urgent questions, you may also contact your provider using MyChart. We now offer e-Visits for anyone 67 and older to request care online for non-urgent symptoms. For details visit [email or web address].   Also download the MyChart app! Go to the app store, search "MyChart", open the app, select [facility], and log in with your MyChart username and password.  Masks are optional in the cancer centers. If you would like for your care team to wear a mask while they are taking care of you, please let them know. You may have one support person who is at least 77 years old accompany you for your appointments. Carboplatin Injection What is this medication? CARBOPLATIN (KAR boe pla tin) treats some types of cancer. It works by slowing down the growth of cancer cells. This medicine may be used for other purposes; ask your health care provider or pharmacist if you have questions. COMMON BRAND NAME(S): Paraplatin What should I tell my care team before I take this medication?  They need to know if you have any of these conditions: Blood disorders Hearing problems Kidney disease Recent or ongoing radiation therapy An unusual or allergic reaction to carboplatin, cisplatin, other medications, foods, dyes, or preservatives Pregnant or trying to get pregnant Breast-feeding How should I use this  medication? This medication is injected into a vein. It is given by your care team in a hospital or clinic setting. Talk to your care team about the use of this medication in children. Special care may be needed. Overdosage: If you think you have taken too much of this medicine contact a poison control center or emergency room at once. NOTE: This medicine is only for you. Do not share this medicine with others. What if I miss a dose? Keep appointments for follow-up doses. It is important not to miss your dose. Call your care team if you are unable to keep an appointment. What may interact with this medication? Medications for seizures Some antibiotics, such as amikacin, gentamicin, neomycin, streptomycin, tobramycin Vaccines This list may not describe all possible interactions. Give your health care provider a list of all the medicines, herbs, non-prescription drugs, or dietary supplements you use. Also tell them if you smoke, drink alcohol, or use illegal drugs. Some items may interact with your medicine. What should I watch for while using this medication? Your condition will be monitored carefully while you are receiving this medication. You may need blood work while taking this medication. This medication may make you feel generally unwell. This is not uncommon, as chemotherapy can affect healthy cells as well as cancer cells. Report any side effects. Continue your course of treatment even though you feel ill unless your care team tells you to stop. In some cases, you may be given additional medications to help with side effects. Follow all directions for their use. This medication may increase your risk of getting an infection. Call your care team for advice if you get a fever, chills, sore throat, or other symptoms of a cold or flu. Do not treat yourself. Try to avoid being around people who are sick. Avoid taking medications that contain aspirin, acetaminophen, ibuprofen, naproxen, or  ketoprofen unless instructed by your care team. These medications may hide a fever. Be careful brushing or flossing your teeth or using a toothpick because you may get an infection or bleed more easily. If you have any dental work done, tell your dentist you are receiving this medication. Talk to your care team if you wish to become pregnant or think you might be pregnant. This medication can cause serious birth defects. Talk to your care team about effective forms of contraception. Do not breast-feed while taking this medication. What side effects may I notice from receiving this medication? Side effects that you should report to your care team as soon as possible: Allergic reactions--skin rash, itching, hives, swelling of the face, lips, tongue, or throat Infection--fever, chills, cough, sore throat, wounds that don't heal, pain or trouble when passing urine, general feeling of discomfort or being unwell Low red blood cell level--unusual weakness or fatigue, dizziness, headache, trouble breathing Pain, tingling, or numbness in the hands or feet, muscle weakness, change in vision, confusion or trouble speaking, loss of balance or coordination, trouble walking, seizures Unusual bruising or bleeding Side effects that usually do not require medical attention (report to your care team if they continue or are bothersome): Hair loss Nausea Unusual weakness or fatigue Vomiting This list may not describe all possible side effects. Call  your doctor for medical advice about side effects. You may report side effects to FDA at 1-800-FDA-1088. Where should I keep my medication? This medication is given in a hospital or clinic. It will not be stored at home. NOTE: This sheet is a summary. It may not cover all possible information. If you have questions about this medicine, talk to your doctor, pharmacist, or health care provider.  2023 Elsevier/Gold Standard (2021-08-10 00:00:00)  Paclitaxel  Injection What is this medication? PACLITAXEL (PAK li TAX el) treats some types of cancer. It works by slowing down the growth of cancer cells. This medicine may be used for other purposes; ask your health care provider or pharmacist if you have questions. COMMON BRAND NAME(S): Onxol, Taxol What should I tell my care team before I take this medication? They need to know if you have any of these conditions: Heart disease Liver disease Low white blood cell levels An unusual or allergic reaction to paclitaxel, other medications, foods, dyes, or preservatives If you or your partner are pregnant or trying to get pregnant Breast-feeding How should I use this medication? This medication is injected into a vein. It is given by your care team in a hospital or clinic setting. Talk to your care team about the use of this medication in children. While it may be given to children for selected conditions, precautions do apply. Overdosage: If you think you have taken too much of this medicine contact a poison control center or emergency room at once. NOTE: This medicine is only for you. Do not share this medicine with others. What if I miss a dose? Keep appointments for follow-up doses. It is important not to miss your dose. Call your care team if you are unable to keep an appointment. What may interact with this medication? Do not take this medication with any of the following: Live virus vaccines Other medications may affect the way this medication works. Talk with your care team about all of the medications you take. They may suggest changes to your treatment plan to lower the risk of side effects and to make sure your medications work as intended. This list may not describe all possible interactions. Give your health care provider a list of all the medicines, herbs, non-prescription drugs, or dietary supplements you use. Also tell them if you smoke, drink alcohol, or use illegal drugs. Some items may  interact with your medicine. What should I watch for while using this medication? Your condition will be monitored carefully while you are receiving this medication. You may need blood work while taking this medication. This medication may make you feel generally unwell. This is not uncommon as chemotherapy can affect healthy cells as well as cancer cells. Report any side effects. Continue your course of treatment even though you feel ill unless your care team tells you to stop. This medication can cause serious allergic reactions. To reduce the risk, your care team may give you other medications to take before receiving this one. Be sure to follow the directions from your care team. This medication may increase your risk of getting an infection. Call your care team for advice if you get a fever, chills, sore throat, or other symptoms of a cold or flu. Do not treat yourself. Try to avoid being around people who are sick. This medication may increase your risk to bruise or bleed. Call your care team if you notice any unusual bleeding. Be careful brushing or flossing your teeth or using a toothpick  because you may get an infection or bleed more easily. If you have any dental work done, tell your dentist you are receiving this medication. Talk to your care team if you may be pregnant. Serious birth defects can occur if you take this medication during pregnancy. Talk to your care team before breastfeeding. Changes to your treatment plan may be needed. What side effects may I notice from receiving this medication? Side effects that you should report to your care team as soon as possible: Allergic reactions--skin rash, itching, hives, swelling of the face, lips, tongue, or throat Heart rhythm changes--fast or irregular heartbeat, dizziness, feeling faint or lightheaded, chest pain, trouble breathing Increase in blood pressure Infection--fever, chills, cough, sore throat, wounds that don't heal, pain or  trouble when passing urine, general feeling of discomfort or being unwell Low blood pressure--dizziness, feeling faint or lightheaded, blurry vision Low red blood cell level--unusual weakness or fatigue, dizziness, headache, trouble breathing Painful swelling, warmth, or redness of the skin, blisters or sores at the infusion site Pain, tingling, or numbness in the hands or feet Slow heartbeat--dizziness, feeling faint or lightheaded, confusion, trouble breathing, unusual weakness or fatigue Unusual bruising or bleeding Side effects that usually do not require medical attention (report to your care team if they continue or are bothersome): Diarrhea Hair loss Joint pain Loss of appetite Muscle pain Nausea Vomiting This list may not describe all possible side effects. Call your doctor for medical advice about side effects. You may report side effects to FDA at 1-800-FDA-1088. Where should I keep my medication? This medication is given in a hospital or clinic. It will not be stored at home. NOTE: This sheet is a summary. It may not cover all possible information. If you have questions about this medicine, talk to your doctor, pharmacist, or health care provider.  2023 Elsevier/Gold Standard (2021-08-26 00:00:00)

## 2022-04-27 ENCOUNTER — Telehealth: Payer: Self-pay

## 2022-04-27 ENCOUNTER — Other Ambulatory Visit: Payer: Self-pay | Admitting: Hematology and Oncology

## 2022-04-27 NOTE — Telephone Encounter (Signed)
Called to follow up to see how she is doing after reaction with first treatment. She feels good today with no complaints. She is clear headed and up eating breakfast.  Instructed to call the office back for questions/ concerns. She verbalized understanding.

## 2022-04-27 NOTE — Telephone Encounter (Signed)
-----   Message from [name], MD sent at 04/27/2022  8:26 AM EST ----- Can you call and ask how she is doing today? She had reaction to taxol

## 2022-04-29 ENCOUNTER — Other Ambulatory Visit: Payer: Self-pay | Admitting: Hematology and Oncology

## 2022-04-29 ENCOUNTER — Inpatient Hospital Stay: Payer: Medicare Other

## 2022-04-29 ENCOUNTER — Telehealth: Payer: Self-pay

## 2022-04-29 DIAGNOSIS — R232 Flushing: Secondary | ICD-10-CM | POA: Diagnosis not present

## 2022-04-29 DIAGNOSIS — C561 Malignant neoplasm of right ovary: Secondary | ICD-10-CM | POA: Diagnosis not present

## 2022-04-29 DIAGNOSIS — L299 Pruritus, unspecified: Secondary | ICD-10-CM | POA: Diagnosis not present

## 2022-04-29 DIAGNOSIS — R300 Dysuria: Secondary | ICD-10-CM

## 2022-04-29 DIAGNOSIS — Z5111 Encounter for antineoplastic chemotherapy: Secondary | ICD-10-CM | POA: Diagnosis not present

## 2022-04-29 DIAGNOSIS — R180 Malignant ascites: Secondary | ICD-10-CM | POA: Diagnosis not present

## 2022-04-29 DIAGNOSIS — R630 Anorexia: Secondary | ICD-10-CM | POA: Diagnosis not present

## 2022-04-29 LAB — URINALYSIS, COMPLETE (UACMP) WITH MICROSCOPIC
Bacteria, UA: NONE SEEN
Bilirubin Urine: NEGATIVE
Glucose, UA: NEGATIVE mg/dL
Hgb urine dipstick: NEGATIVE
Ketones, ur: NEGATIVE mg/dL
Leukocytes,Ua: NEGATIVE
Nitrite: NEGATIVE
Protein, ur: NEGATIVE mg/dL
Specific Gravity, Urine: 1.005 (ref 1.005–1.030)
pH: 6 (ref 5.0–8.0)

## 2022-04-29 NOTE — Telephone Encounter (Signed)
Called and given below message. Added 1 pm lab appt for today. She verbalized understanding and appreciated the call.

## 2022-04-29 NOTE — Telephone Encounter (Signed)
Can she come in for UA and UCx? I placed order We can call her tomorrow with resultys

## 2022-04-29 NOTE — Telephone Encounter (Signed)
Returned her call. She is having urinary frequency mainly at night that is more than usual for her. She is getting up every couple of hours to urinate during the night. She is drinking fluids as instructed and has pale colored urine. Denies constipation, fever, burning and itching, etc. She is concerned for the holiday and just wanted to check in with Dr. Alvy Bimler.

## 2022-04-30 ENCOUNTER — Telehealth: Payer: Self-pay | Admitting: Oncology

## 2022-04-30 LAB — URINE CULTURE

## 2022-04-30 NOTE — Telephone Encounter (Signed)
Called April Weber and notified her of her negative urine culture results and discussed increasing her intact of oral fluids.  She verbalized understanding and agreement of results and recommendations.

## 2022-05-14 MED FILL — Dexamethasone Sodium Phosphate Inj 100 MG/10ML: INTRAMUSCULAR | Qty: 1 | Status: AC

## 2022-05-14 MED FILL — Fosaprepitant Dimeglumine For IV Infusion 150 MG (Base Eq): INTRAVENOUS | Qty: 5 | Status: AC

## 2022-05-17 ENCOUNTER — Inpatient Hospital Stay (HOSPITAL_BASED_OUTPATIENT_CLINIC_OR_DEPARTMENT_OTHER): Payer: Medicare Other | Admitting: Physician Assistant

## 2022-05-17 ENCOUNTER — Other Ambulatory Visit: Payer: Medicare Other

## 2022-05-17 ENCOUNTER — Other Ambulatory Visit: Payer: Self-pay

## 2022-05-17 ENCOUNTER — Encounter: Payer: Self-pay | Admitting: Hematology and Oncology

## 2022-05-17 ENCOUNTER — Inpatient Hospital Stay: Payer: Medicare Other

## 2022-05-17 ENCOUNTER — Inpatient Hospital Stay (HOSPITAL_BASED_OUTPATIENT_CLINIC_OR_DEPARTMENT_OTHER): Payer: Medicare Other | Admitting: Hematology and Oncology

## 2022-05-17 ENCOUNTER — Inpatient Hospital Stay: Payer: Medicare Other | Attending: Gynecologic Oncology

## 2022-05-17 VITALS — BP 150/82 | HR 87 | Temp 97.4°F | Resp 18 | Ht 67.0 in | Wt 159.6 lb

## 2022-05-17 VITALS — BP 155/74 | HR 74 | Temp 97.8°F | Resp 18

## 2022-05-17 DIAGNOSIS — R531 Weakness: Secondary | ICD-10-CM | POA: Insufficient documentation

## 2022-05-17 DIAGNOSIS — C561 Malignant neoplasm of right ovary: Secondary | ICD-10-CM

## 2022-05-17 DIAGNOSIS — N1831 Chronic kidney disease, stage 3a: Secondary | ICD-10-CM | POA: Diagnosis not present

## 2022-05-17 DIAGNOSIS — I10 Essential (primary) hypertension: Secondary | ICD-10-CM | POA: Diagnosis not present

## 2022-05-17 DIAGNOSIS — E860 Dehydration: Secondary | ICD-10-CM | POA: Diagnosis not present

## 2022-05-17 DIAGNOSIS — R634 Abnormal weight loss: Secondary | ICD-10-CM | POA: Insufficient documentation

## 2022-05-17 DIAGNOSIS — T50905A Adverse effect of unspecified drugs, medicaments and biological substances, initial encounter: Secondary | ICD-10-CM

## 2022-05-17 DIAGNOSIS — Z5111 Encounter for antineoplastic chemotherapy: Secondary | ICD-10-CM | POA: Insufficient documentation

## 2022-05-17 DIAGNOSIS — Z95828 Presence of other vascular implants and grafts: Secondary | ICD-10-CM

## 2022-05-17 DIAGNOSIS — R110 Nausea: Secondary | ICD-10-CM | POA: Diagnosis not present

## 2022-05-17 DIAGNOSIS — D6481 Anemia due to antineoplastic chemotherapy: Secondary | ICD-10-CM | POA: Insufficient documentation

## 2022-05-17 DIAGNOSIS — Z79899 Other long term (current) drug therapy: Secondary | ICD-10-CM | POA: Diagnosis not present

## 2022-05-17 LAB — CBC WITH DIFFERENTIAL (CANCER CENTER ONLY)
Abs Immature Granulocytes: 0.03 10*3/uL (ref 0.00–0.07)
Basophils Absolute: 0 10*3/uL (ref 0.0–0.1)
Basophils Relative: 0 %
Eosinophils Absolute: 0 10*3/uL (ref 0.0–0.5)
Eosinophils Relative: 0 %
HCT: 34.3 % — ABNORMAL LOW (ref 36.0–46.0)
Hemoglobin: 12.1 g/dL (ref 12.0–15.0)
Immature Granulocytes: 0 %
Lymphocytes Relative: 7 %
Lymphs Abs: 0.6 10*3/uL — ABNORMAL LOW (ref 0.7–4.0)
MCH: 31.2 pg (ref 26.0–34.0)
MCHC: 35.3 g/dL (ref 30.0–36.0)
MCV: 88.4 fL (ref 80.0–100.0)
Monocytes Absolute: 0.1 10*3/uL (ref 0.1–1.0)
Monocytes Relative: 2 %
Neutro Abs: 7.9 10*3/uL — ABNORMAL HIGH (ref 1.7–7.7)
Neutrophils Relative %: 91 %
Platelet Count: 179 10*3/uL (ref 150–400)
RBC: 3.88 MIL/uL (ref 3.87–5.11)
RDW: 12.2 % (ref 11.5–15.5)
WBC Count: 8.6 10*3/uL (ref 4.0–10.5)
nRBC: 0 % (ref 0.0–0.2)

## 2022-05-17 LAB — CMP (CANCER CENTER ONLY)
ALT: 13 U/L (ref 0–44)
AST: 15 U/L (ref 15–41)
Albumin: 4.1 g/dL (ref 3.5–5.0)
Alkaline Phosphatase: 115 U/L (ref 38–126)
Anion gap: 8 (ref 5–15)
BUN: 21 mg/dL (ref 8–23)
CO2: 25 mmol/L (ref 22–32)
Calcium: 9.6 mg/dL (ref 8.9–10.3)
Chloride: 102 mmol/L (ref 98–111)
Creatinine: 1.2 mg/dL — ABNORMAL HIGH (ref 0.44–1.00)
GFR, Estimated: 47 mL/min — ABNORMAL LOW (ref >60)
Glucose, Bld: 146 mg/dL — ABNORMAL HIGH (ref 70–99)
Potassium: 4 mmol/L (ref 3.5–5.1)
Sodium: 135 mmol/L (ref 135–145)
Total Bilirubin: 0.4 mg/dL (ref 0.3–1.2)
Total Protein: 6.4 g/dL — ABNORMAL LOW (ref 6.5–8.1)

## 2022-05-17 MED ORDER — HEPARIN SOD (PORK) LOCK FLUSH 100 UNIT/ML IV SOLN
500.0000 [IU] | Freq: Once | INTRAVENOUS | Status: AC | PRN
  Administered 2022-05-17: 500 [IU]

## 2022-05-17 MED ORDER — CETIRIZINE HCL 10 MG/ML IV SOLN
10.0000 mg | Freq: Once | INTRAVENOUS | Status: AC
  Administered 2022-05-17: 10 mg via INTRAVENOUS
  Filled 2022-05-17: qty 1

## 2022-05-17 MED ORDER — SODIUM CHLORIDE 0.9 % IV SOLN
10.0000 mg | Freq: Once | INTRAVENOUS | Status: AC
  Administered 2022-05-17: 10 mg via INTRAVENOUS
  Filled 2022-05-17: qty 10

## 2022-05-17 MED ORDER — FAMOTIDINE IN NACL 20-0.9 MG/50ML-% IV SOLN
20.0000 mg | Freq: Once | INTRAVENOUS | Status: AC
  Administered 2022-05-17: 20 mg via INTRAVENOUS
  Filled 2022-05-17: qty 50

## 2022-05-17 MED ORDER — SODIUM CHLORIDE 0.9% FLUSH
10.0000 mL | INTRAVENOUS | Status: DC | PRN
  Administered 2022-05-17: 10 mL

## 2022-05-17 MED ORDER — SODIUM CHLORIDE 0.9 % IV SOLN: 75.0000 mg/m2 | Freq: Once | INTRAVENOUS | Status: DC

## 2022-05-17 MED ORDER — PALONOSETRON HCL INJECTION 0.25 MG/5ML
0.2500 mg | Freq: Once | INTRAVENOUS | Status: AC
  Administered 2022-05-17: 0.25 mg via INTRAVENOUS
  Filled 2022-05-17: qty 5

## 2022-05-17 MED ORDER — DEXAMETHASONE 4 MG PO TABS: ORAL_TABLET | ORAL | 6 refills | Status: DC

## 2022-05-17 MED ORDER — SODIUM CHLORIDE 0.9 % IV SOLN
150.0000 mg | Freq: Once | INTRAVENOUS | Status: AC
  Administered 2022-05-17: 150 mg via INTRAVENOUS
  Filled 2022-05-17: qty 150

## 2022-05-17 MED ORDER — SODIUM CHLORIDE 0.9 % IV SOLN
60.0000 mg/m2 | Freq: Once | INTRAVENOUS | Status: AC
  Administered 2022-05-17: 110 mg via INTRAVENOUS
  Filled 2022-05-17: qty 11

## 2022-05-17 MED ORDER — SODIUM CHLORIDE 0.9 % IV SOLN: Freq: Once | INTRAVENOUS | Status: AC

## 2022-05-17 MED ORDER — SODIUM CHLORIDE 0.9 % IV SOLN
417.6000 mg | Freq: Once | INTRAVENOUS | Status: AC
  Administered 2022-05-17: 400 mg via INTRAVENOUS
  Filled 2022-05-17: qty 40

## 2022-05-17 MED ORDER — SODIUM CHLORIDE 0.9% FLUSH
10.0000 mL | INTRAVENOUS | Status: AC | PRN
  Administered 2022-05-17: 10 mL

## 2022-05-17 NOTE — Patient Instructions (Signed)
[facility]  Discharge Instructions: Thank you for choosing [facility] to provide your oncology and hematology care.   If you have a lab appointment with the [facility], please go directly to the [facility] and check in at the registration area.   Wear comfortable clothing and clothing appropriate for easy access to any Portacath or PICC line.   We strive to give you quality time with your provider. You may need to reschedule your appointment if you arrive late (15 or more minutes).  Arriving late affects you and other patients whose appointments are after yours.  Also, if you miss three or more appointments without notifying the office, you may be dismissed from the clinic at the provider's discretion.      For prescription refill requests, have your pharmacy contact our office and allow 72 hours for refills to be completed.    Today you received the following chemotherapy and/or immunotherapy agents Docetaxel and Carboplatin      To help prevent nausea and vomiting after your treatment, we encourage you to take your nausea medication as directed.  BELOW ARE SYMPTOMS THAT SHOULD BE REPORTED IMMEDIATELY: *FEVER GREATER THAN 100.4 F (38 C) OR HIGHER *CHILLS OR SWEATING *NAUSEA AND VOMITING THAT IS NOT CONTROLLED WITH YOUR NAUSEA MEDICATION *UNUSUAL SHORTNESS OF BREATH *UNUSUAL BRUISING OR BLEEDING *URINARY PROBLEMS (pain or burning when urinating, or frequent urination) *BOWEL PROBLEMS (unusual diarrhea, constipation, pain near the anus) TENDERNESS IN MOUTH AND THROAT WITH OR WITHOUT PRESENCE OF ULCERS (sore throat, sores in mouth, or a toothache) UNUSUAL RASH, SWELLING OR PAIN  UNUSUAL VAGINAL DISCHARGE OR ITCHING   Items with * indicate a potential emergency and should be followed up as soon as possible or go to the Emergency Department if any problems should occur.  Please show the CHEMOTHERAPY ALERT CARD or IMMUNOTHERAPY ALERT CARD  at check-in to the Emergency Department and triage nurse.  Should you have questions after your visit or need to cancel or reschedule your appointment, please contact [facility]  Dept: [phone]  and follow the prompts.  Office hours are 8:00 a.m. to 4:30 p.m. Monday - Friday. Please note that voicemails left after 4:00 p.m. may not be returned until the following business day.  We are closed weekends and major holidays. You have access to a nurse at all times for urgent questions. Please call the main number to the clinic Dept: [phone] and follow the prompts.   For any non-urgent questions, you may also contact your provider using MyChart. We now offer e-Visits for anyone 40 and older to request care online for non-urgent symptoms. For details visit [email or web address].   Also download the MyChart app! Go to the app store, search "MyChart", open the app, select [facility], and log in with your MyChart username and password.

## 2022-05-17 NOTE — Progress Notes (Addendum)
Spoke w/ Dr. Alvy Bimler will reduce taxotere to 60 mg/m2 for today's dose and will follow up moving forward per MD.   Larene Beach, PharmD

## 2022-05-17 NOTE — Progress Notes (Signed)
[place] OFFICE PROGRESS NOTE  Patient Care Team: [name], DO as PCP - General (Family Medicine) Vania Rea, MD as Attending Physician (Obstetrics and Gynecology) Linus Mako, MD as Consulting Physician (Family Medicine) Celestia Khat, OD (Optometry)  ASSESSMENT & PLAN:  Right ovarian epithelial cancer [facility]) She developed allergic reaction to Taxol and is now switched to Taxotere We will proceed with treatment without delay  Stage 3a chronic kidney disease We will continue risk factor modification I recommend resumption of her other medications In terms of losartan, I recommend her to take 50 mg only if her blood pressure is greater than 740 systolic  No orders of the defined types were placed in this encounter.   All questions were answered. The patient knows to call the clinic with any problems, questions or concerns. The total time spent in the appointment was 20 minutes encounter with patients including review of chart and various tests results, discussions about plan of care and coordination of care plan   [name], MD 05/17/2022 9:19 AM  INTERVAL HISTORY: Please see below for problem oriented charting. she returns for treatment follow-up seen prior to cycle 2 of treatment She denies dysuria No side effects from recent treatment  REVIEW OF SYSTEMS:   Constitutional: Denies fevers, chills or abnormal weight loss Eyes: Denies blurriness of vision Ears, nose, mouth, throat, and face: Denies mucositis or sore throat Respiratory: Denies cough, dyspnea or wheezes Cardiovascular: Denies palpitation, chest discomfort or lower extremity swelling Gastrointestinal:  Denies nausea, heartburn or change in bowel habits Skin: Denies abnormal skin rashes Lymphatics: Denies new lymphadenopathy or easy bruising Neurological:Denies numbness, tingling or new weaknesses Behavioral/Psych: Mood is stable, no new changes  All other systems were reviewed  with the patient and are negative.  I have reviewed the past medical history, past surgical history, social history and family history with the patient and they are unchanged from previous note.  ALLERGIES:  is allergic to paclitaxel, codeine, nsaids, penicillins, vibra-tab [doxycycline], iodine, and latex.  MEDICATIONS:  Current Outpatient Medications  Medication Sig Dispense Refill   alendronate (FOSAMAX) 70 MG tablet Take 70 mg by mouth once a week. Take with a full glass of water on an empty stomach.     aspirin EC 81 MG tablet Take 81 mg by mouth daily. Swallow whole.     losartan (COZAAR) 100 MG tablet Take 100 mg by mouth daily.     simvastatin (ZOCOR) 20 MG tablet Take 20 mg by mouth daily.     acetaminophen (TYLENOL) 500 MG tablet Take 500-1,000 mg by mouth daily as needed for moderate pain, fever or headache.     amLODipine (NORVASC) 5 MG tablet Take 1 tablet (5 mg total) by mouth daily. (Patient taking differently: Take 5 mg by mouth in the morning.) 90 tablet 3   atenolol (TENORMIN) 25 MG tablet Take 1 tablet (25 mg total) by mouth daily. (Patient taking differently: Take 12.5 mg by mouth 2 (two) times daily.) 90 tablet 3   Calcium Carb-Cholecalciferol (CALCIUM + VITAMIN D3 PO) Take 1 tablet by mouth 2 (two) times daily.     dexamethasone (DECADRON) 4 MG tablet Take 2 tabs at the day before and 1 tab daily for 2 days after chemotherapy 36 tablet 6   levothyroxine (SYNTHROID) 100 MCG tablet Take 1/2 tablet on Sundays and 1 tablet daily all other days (Patient taking differently: Take 100 mcg by mouth in the morning.) 90 tablet 3   lidocaine-prilocaine (EMLA) cream  Apply to affected area once 30 g 3   loratadine (CLARITIN) 10 MG tablet Take 10 mg by mouth at bedtime.     Multiple Vitamin (MULTIVITAMIN) tablet Take 1 tablet by mouth in the morning.     ondansetron (ZOFRAN) 8 MG tablet Take 1 tablet (8 mg total) by mouth every 8 (eight) hours as needed for nausea or vomiting. Start on  the third day after chemotherapy. 30 tablet 1   prochlorperazine (COMPAZINE) 10 MG tablet Take 1 tablet (10 mg total) by mouth every 6 (six) hours as needed for nausea or vomiting. 30 tablet 1   simvastatin (ZOCOR) 20 MG tablet Take 1 tablet (20 mg total) by mouth at bedtime. Cancel rx for '10mg'$ . 90 tablet 3   No current facility-administered medications for this visit.    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 Large, mixed solid and cystic mass in the low pelvis, difficult to clearly delineate from adjacent ascites although at least 10.2 x 10.0 cm. This is highly concerning for primary ovarian malignancy and most likely arises from the right ovary, although may involve both ovaries. This may be further evaluated by contrast enhanced MRI of the pelvis if desired. 2. Large volume ascites throughout the abdomen and pelvis, presumed malignant. Stranding and nodularity of the omentum and peritoneum, particularly in the ventral abdomen and left upper quadrant, highly suspicious for peritoneal metastatic disease. 3. Cirrhotic morphology of the liver, which may contribute to ascites. 4. Severely atrophic left kidney, consistent with remote prior infectious, obstructive, or ischemic insult. 5. Coronary artery disease.   Aortic Atherosclerosis (ICD10-I70.0).   03/17/2022 Imaging   1. There is a large solid and cystic mass within the right adnexa which measures up to 9.5 cm in maximum dimension. Findings are concerning for a primary ovarian neoplasm. 2. Large volume of ascites within the abdomen and pelvis. Given the presence of peritoneal nodules on the CT from the prior day findings are concerning for malignant ascites.   03/19/2022 Procedure   CT-guided biopsy of omental mass.    03/19/2022 Procedure   Successful ultrasound-guided paracentesis yielding 4.2 liters of peritoneal  fluid.   03/19/2022 Procedure   Successful placement of a left internal jugular approach power injectable Port-A-Cath. The catheter is ready for immediate use.   03/30/2022 Initial Diagnosis   Ovarian cancer ([place])   03/30/2022 Pathology Results   A. RIGHT OVARY AND FALLOPIAN TUBE, SALPINGO OOPHORECTOMY: Poorly differentiated carcinoma immunohistochemically most consistent with a high grade serous carcinoma Tumor measures 10.7 x 10.6 x 4.6 cm Tumor involves ovarian and mesosalpinx serosal surfaces and right fallopian tube pT2a pN n/a  Note: The tumor consists of sheets and cords of high-grade nuclei with areas of necrosis and adjacent prominent stroma suggesting papillary cores the overall morphology is slightly unusual and not classic; therefore, a battery of immunohistochemical stains were performed to confirm the diagnosis and exclude a high-grade germ cell tumor.  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 Additionally there is nonspecific focal PLAP positivity; CD117 positivity and D2-40 positivity of uncertain clinical significance.  The tumor is negative for AFP, hCG, CD30, CD31, CK20, glypican-3, and OCT 3/4.  Dr. Saralyn Pilar has peer reviewed the case and agrees with diagnosis. Dr. Alric Seton has also initiated the workup of the case and agrees  with the interpretation.  B. OMENTUM, RESECTION: Benign omentum Negative for tumor  C. RIGHT PELVIC SIDEWALL, BIOPSY: Benign mesothelium, fibrovascular stroma and adipose Negative for tumor  D. ANTERIOR CUL DE SAC, BIOPSY: Benign mesothelium Negative for tumor  E. LEFT PELVIC SIDEWALL, BIOPSY: Benign mesothelium and adipose Negative for tumor   ONCOLOGY TABLE:  OVARY or FALLOPIAN TUBE or PRIMARY PERITONEUM: Resection  Procedure: Right salpingo-oophorectomy Specimen Integrity: Grossly focally disrupted Tumor Site: Right  ovary Tumor Size: 10.7 x 10.6 x 4.6 cm Histologic Type: Poorly differentiated carcinoma most consistent with a high-grade serous carcinoma Histologic Grade: High-grade Ovarian Surface Involvement: Surface involvement Fallopian Tube Surface Involvement: Mesosalpinx serosal surface involvement and right fallopian tube involvement Implants (required for advanced stage serous/seromucinous borderline tumors only): Not identified Other Tissue/ Organ Involvement: N/A Largest Extrapelvic Peritoneal Focus: N/A Peritoneal/Ascitic Fluid Involvement: N/A Chemotherapy Response Score (CRS): N/A, no known presurgical therapy Regional Lymph Nodes: N/A, no lymph nodes submitted Distant Metastasis:      Distant Site(s) Involved: N/A Pathologic Stage Classification (pTNM, AJCC 8th Edition): pT2a, pN N/A Ancillary Studies: Can be performed on physician request Representative Tumor Block: A8 Comment(s): [None]    03/30/2022 Surgery   Preop Diagnosis: Pelvic mass, malignant ascites   Postoperative Diagnosis: at least stage IIB ovarian cancer, suspected high grade serous by frozen section   Surgery: Diagnostic laparoscopy, right salpingo-oophorectomy, peritoneal biopsies, infra-colic omentectomy    Surgeon:  Valarie Cones MD    Operative findings: On exam under anesthesia, somewhat mobile mass appreciated in the cul-de-sac to the right.  On agnostic laparoscopy, normal upper abdominal findings other than moderate volume ascites.  Normal-appearing peritoneum, omentum, small bowel.  Right adnexa enlarged with a mass measuring approximately 8-10 cm.  No carcinomatosis appreciated.  Given this, decision made to convert to an open procedure for tumor debulking.  Approximately 5 L of yellow-tinged ascites was removed upon intra-abdominal entry.  Normal upper abdominal survey including smooth diaphragm and liver, stomach, and omentum.  The small bowel was run from the cecum to the ligament of Treitz with no  abnormalities or carcinomatosis noted.  Right ovary replaced by an approximately 8 cm smooth mass, dilated and enlarged fallopian tube.  The mass itself was adherent to the right pelvic sidewall, sigmoid colon, and densely adherent to the anterior cul-de-sac/bladder peritoneum.  Frozen section consistent with invasive carcinoma, likely high-grade.  Left tube and ovary were surgically absent.  Sigmoid colon with adhesions to the left pelvic sidewall, left aspect of the bladder and the left vaginal cuff.  Somewhat redundant sigmoid which looped over on itself secondary to these adhesions.  Numerous diverticula noted of the sigmoid colon as well as the transverse colon.  The end of surgery, an R0 resection was accomplished.     04/13/2022 Cancer Staging   Staging form: Ovary, Fallopian Tube, and Primary Peritoneal Carcinoma, AJCC 8th Edition - Pathologic stage from 04/13/2022: Stage II (pT2, pN0, cM0) - Signed by [name], MD on 04/13/2022 Stage prefix: Initial diagnosis   04/26/2022 -  Chemotherapy   Patient is on Treatment Plan : OVARIAN Carboplatin (AUC 6) + Paclitaxel (175) q21d X 6 Cycles       PHYSICAL EXAMINATION: ECOG PERFORMANCE STATUS: 1 - Symptomatic but completely ambulatory  Vitals:   05/17/22 0857  BP: (!) 150/82  Pulse: 87  Resp: 18  Temp: (!) 97.4 F (36.3 C)  SpO2: 99%   Filed Weights   05/17/22 0857  Weight: 159 lb 9.6 oz (72.4 kg)  GENERAL:alert, no distress and comfortable NEURO: alert & oriented x 3 with fluent speech, no focal motor/sensory deficits  LABORATORY DATA:  I have reviewed the data as listed    Component Value Date/Time   NA 135 05/17/2022 0825   NA 135 11/05/2021 0810   K 4.0 05/17/2022 0825   CL 102 05/17/2022 0825   CO2 25 05/17/2022 0825   GLUCOSE 146 (H) 05/17/2022 0825   BUN 21 05/17/2022 0825   BUN 11 11/05/2021 0810   CREATININE 1.20 (H) 05/17/2022 0825   CREATININE 0.99 02/05/2013 0757   CALCIUM 9.6 05/17/2022 0825   PROT 6.4  (L) 05/17/2022 0825   PROT 6.8 05/13/2021 0813   ALBUMIN 4.1 05/17/2022 0825   ALBUMIN 4.3 11/05/2021 0810   AST 15 05/17/2022 0825   ALT 13 05/17/2022 0825   ALKPHOS 115 05/17/2022 0825   BILITOT 0.4 05/17/2022 0825   GFRNONAA 47 (L) 05/17/2022 0825   GFRNONAA 59 (L) 02/05/2013 0757   GFRAA 61 05/20/2020 0850   GFRAA 68 02/05/2013 0757    No results found for: "SPEP", "UPEP"  Lab Results  Component Value Date   WBC 8.6 05/17/2022   NEUTROABS 7.9 (H) 05/17/2022   HGB 12.1 05/17/2022   HCT 34.3 (L) 05/17/2022   MCV 88.4 05/17/2022   PLT 179 05/17/2022      Chemistry      Component Value Date/Time   NA 135 05/17/2022 0825   NA 135 11/05/2021 0810   K 4.0 05/17/2022 0825   CL 102 05/17/2022 0825   CO2 25 05/17/2022 0825   BUN 21 05/17/2022 0825   BUN 11 11/05/2021 0810   CREATININE 1.20 (H) 05/17/2022 0825   CREATININE 0.99 02/05/2013 0757      Component Value Date/Time   CALCIUM 9.6 05/17/2022 0825   ALKPHOS 115 05/17/2022 0825   AST 15 05/17/2022 0825   ALT 13 05/17/2022 0825   BILITOT 0.4 05/17/2022 0825

## 2022-05-17 NOTE — Progress Notes (Signed)
Hypersensitivity Reaction note  Date of event: 05/17/22 Time of event: 0927 Generic name of drug involved: Emend Name of provider notified of the hypersensitivity reaction: Lisabeth Devoid, PA Was agent that likely caused hypersensitivity reaction added to Allergies List within EMR? Yes Chain of events including reaction signs/symptoms, treatment administered, and outcome (e.g., drug resumed; drug discontinued; sent to Emergency Department; etc.) [phone]: Patient began to complain of light headedness and facial flushing. Patient noted to have redness to face, bilateral hands and neck.  4481: 1L NS started 0930: Pepcid '20mg'$  administered. Verline Lema, PA arrived and assessed patient. Per PA no need for additional pre medications.  0935: Patient noted relief of symptoms and redness began to resolve. Per [place], PA discontinue Emend and restart ordered pre medications. Decadron restarted and patient noted to be back to baseline. Patient proceeded with scheduled first time docetaxel and carboplatin without issues.   Juanetta Gosling, RN 05/17/2022 1:50 PM

## 2022-05-17 NOTE — Assessment & Plan Note (Signed)
She developed allergic reaction to Taxol and is now switched to Taxotere We will proceed with treatment without delay

## 2022-05-17 NOTE — Assessment & Plan Note (Addendum)
We will continue risk factor modification I recommend resumption of her other medications In terms of losartan, I recommend her to take 50 mg only if her blood pressure is greater than 159 systolic

## 2022-05-17 NOTE — Progress Notes (Signed)
    DATE:  05/17/22                                        X MEDICATION REACTION             MD: Alvy Bimler   AGENT/BLOOD PRODUCT RECEIVING TODAY:              Carboplatin, Taxotere   AGENT/BLOOD PRODUCT RECEIVING IMMEDIATELY PRIOR TO REACTION:          Emend   VS: BP:     171/76   P:       75       SPO2:       99                BP:     151/81   P:       78       SPO2:       100     REACTION(S):           Facial flushing. Light headedness   PREMEDS:     Emend 450 mg IV, Decadron 10 mg IV   INTERVENTION: Pepcid 20 mg IV   Review of Systems  Review of Systems  Skin:  Positive for color change.  Neurological:  Positive for light-headedness.  All other systems reviewed and are negative.    Physical Exam  Physical Exam Vitals and nursing note reviewed.  Constitutional:      Appearance: She is well-developed. She is not ill-appearing or toxic-appearing.  HENT:     Head: Normocephalic.     Nose: Nose normal.  Eyes:     Conjunctiva/sclera: Conjunctivae normal.  Neck:     Vascular: No JVD.  Cardiovascular:     Rate and Rhythm: Normal rate and regular rhythm.     Pulses: Normal pulses.     Heart sounds: Normal heart sounds.  Pulmonary:     Effort: Pulmonary effort is normal.     Breath sounds: Normal breath sounds.  Abdominal:     General: There is no distension.  Musculoskeletal:     Cervical back: Normal range of motion.  Skin:    General: Skin is warm and dry.     Comments: Facial flushing  Neurological:     Mental Status: She is oriented to person, place, and time.     OUTCOME:                Patient with reaction to Emend 3 minutes into administration. Pepcid given and symptoms resolved. MD made aware.

## 2022-05-18 LAB — CA 125: Cancer Antigen (CA) 125: 14.2 U/mL (ref 0.0–38.1)

## 2022-05-19 ENCOUNTER — Telehealth: Payer: Self-pay

## 2022-05-19 ENCOUNTER — Other Ambulatory Visit: Payer: Self-pay | Admitting: Hematology and Oncology

## 2022-05-19 NOTE — Telephone Encounter (Signed)
-----   Message from [name], MD sent at 05/19/2022  8:32 AM EST ----- She had mild reaction to [facility] Can you call and see how she is doing today?

## 2022-05-19 NOTE — Telephone Encounter (Signed)
Called to follow up. She is doing well with no problems. Denies constipation and eating with no problems. Reviewed Losartan instructions from Dr. Alvy Bimler note. Dr. Alvy Bimler, recommends that she take 50 mg only if her blood pressure is greater than 917 systolic. She will call the office back for questions and keep monitoring BP.

## 2022-05-21 ENCOUNTER — Telehealth: Payer: Self-pay

## 2022-05-21 NOTE — Telephone Encounter (Signed)
Returned patient's call in regards to medication and symptom management.  Patient reports urgent small, frequent softer stools with mucus that have been occurring since last treatment. Patient wanted to confirm whether or not she can take imodium to help alleviate these symptoms. Patient encouraged to take imodium and reviewed directions on how to properly take it. Patient states that she will just take one to get started as she has a lot of anxiety around taking medication d/t history of allergic reactions.  Patient also reports not taking any of her prn nausea medication d/t to fear of possible allergic reaction. Pt states that she will see her PCP on Monday and try one while she is in the office with her, so that she will be comfortable taking in the future should she have any nausea. Patient reports feeling better today, aside from frequent stools. RN reviewed home medications with patient who verbalized an understanding on how to take them.  Patient reminded to call nurse access line over the weekend should she have any other questions or concerns.  All questions answered during call.

## 2022-05-24 ENCOUNTER — Ambulatory Visit: Payer: Medicare Other | Admitting: Family Medicine

## 2022-05-24 ENCOUNTER — Ambulatory Visit (INDEPENDENT_AMBULATORY_CARE_PROVIDER_SITE_OTHER): Payer: Medicare Other | Admitting: Nurse Practitioner

## 2022-05-24 ENCOUNTER — Encounter: Payer: Self-pay | Admitting: Nurse Practitioner

## 2022-05-24 VITALS — BP 111/60 | HR 104 | Temp 97.0°F | Resp 20 | Ht 67.0 in | Wt 154.0 lb

## 2022-05-24 DIAGNOSIS — R110 Nausea: Secondary | ICD-10-CM | POA: Diagnosis not present

## 2022-05-24 MED ORDER — EPINEPHRINE 0.3 MG/0.3ML IJ SOAJ: 0.3000 mg | INTRAMUSCULAR | 2 refills | Status: DC | PRN

## 2022-05-24 NOTE — Progress Notes (Signed)
   Subjective:    Patient ID: April Weber, female    DOB: 03-09-1946, 77 y.o.   MRN: [id number]   Chief Complaint: Nausea (Wants to take Zofran here because she had a reaction last time/)   Patient comes in c/o intermittent nausea. She is taking chemo for ovarian cancer and stays nauseous. She went into anaphylactic shock with treatments they gave her for nausea before chemo. Th emed was [employer]. They had  to give her benadryl but did not have to do epi. She is nauseaus and has not eaten in several days. Has zofran and would like to take it here to see if she has reaction  to it.       Review of Systems  Gastrointestinal:  Positive for nausea. Negative for vomiting.       Objective:   Physical Exam Constitutional:      Appearance: Normal appearance.  Cardiovascular:     Rate and Rhythm: Normal rate and regular rhythm.     Heart sounds: Normal heart sounds.  Pulmonary:     Effort: Pulmonary effort is normal.     Breath sounds: Normal breath sounds.  Abdominal:     General: Abdomen is flat. Bowel sounds are normal.     Palpations: Abdomen is soft.  Skin:    General: Skin is warm and dry.  Neurological:     General: No focal deficit present.     Mental Status: She is alert and oriented to person, place, and time.  Psychiatric:        Mood and Affect: Mood normal.     BP 111/60   Pulse (!) 104   Temp (!) 97 F (36.1 C) (Temporal)   Resp 20   Ht '5\' 7"'$  (1.702 m)   Wt 154 lb (69.9 kg)   SpO2 94%   BMI 24.12 kg/m        Assessment & Plan:   April Weber in today with chief complaint of Nausea (Wants to take Zofran here because she had a reaction last time/)   1. Nausea Take zofran and will watch her for 30 minutes First 24 Hours-Clear liquids  popsicles  Jello  gatorade  Sprite Second 24 hours-Add Full liquids ( Liquids you cant see through) Third 24 hours- Bland diet ( foods that are baked or broiled)  *avoiding fried foods and highly spiced  foods* During these 3 days  Avoid milk, cheese, ice cream or any other dairy products  Avoid caffeine- REMEMBER Mt. Dew and Mello Yellow contain lots of caffeine You should eat and drink in  Frequent small volumes If no improvement in symptoms or worsen in 2-3 days should RETRUN TO OFFICE or go to ER!       The above assessment and management plan was discussed with the patient. The patient verbalized understanding of and has agreed to the management plan. Patient is aware to call the clinic if symptoms persist or worsen. Patient is aware when to return to the clinic for a follow-up visit. Patient educated on when it is appropriate to go to the emergency department.   Mary-Margaret Hassell Done, FNP

## 2022-05-24 NOTE — Patient Instructions (Signed)
Nausea, Adult Nausea is feeling like you may vomit. Feeling like you may vomit is usually not serious, but it may be an early sign of a more serious medical problem. Vomiting is when stomach contents forcefully come out of your mouth. If you vomit, or if you are not able to drink enough fluids, you may not have enough water in your body (get dehydrated). If you do not have enough water in your body, you may: Feel tired. Feel thirsty. Have a dry mouth. Have cracked lips. Pee (urinate) less often. Older adults and people who have other diseases or a weak body defense system (immune system) have a higher risk of not having enough water in the body. The main goals of treating this condition are: To relieve your nausea. To ensure your nausea occurs less often. To prevent vomiting and losing too much fluid. Follow these instructions at home: Watch your symptoms for any changes. Tell your doctor about them. Eating and drinking     Take an ORS (oral rehydration solution). This is a drink that is sold at pharmacies and stores. Drink clear fluids in small amounts as you are able. These include: Water. Ice chips. Fruit juice that has water added (diluted fruit juice). Low-calorie sports drinks. Eat bland, easy-to-digest foods in small amounts as you are able, such as: Bananas. Applesauce. Rice. Low-fat (lean) meats. Toast. Crackers. Avoid drinking fluids that have a lot of sugar or caffeine in them. This includes energy drinks, sports drinks, and soda. Avoid alcohol. Avoid spicy or fatty foods. General instructions Take over-the-counter and prescription medicines only as told by your doctor. Rest at home while you get better. Drink enough fluid to keep your pee (urine) pale yellow. Take slow and deep breaths when you feel like you may vomit. Avoid food or things that have strong smells. Wash your hands often with soap and water for at least 20 seconds. If you cannot use soap and water,  use hand sanitizer. Make sure that everyone in your home washes their hands well and often. Keep all follow-up visits. Contact a doctor if: You feel worse. You feel like you may vomit and this lasts for more than 2 days. You vomit. You are not able to drink fluids without vomiting. You have new symptoms. You have a fever. You have a headache. You have muscle cramps. You have a rash. You have pain while peeing. You feel light-headed or dizzy. Get help right away if: You have pain in your chest, neck, arm, or jaw. You feel very weak or you faint. You have vomit that is bright red or looks like coffee grounds. You have bloody or black poop (stools) or poop that looks like tar. You have a very bad headache, a stiff neck, or both. You have very bad pain, cramping, or bloating in your belly (abdomen). You have trouble breathing or you are breathing very quickly. Your heart is beating very quickly. Your skin feels cold and clammy. You feel confused. You have signs of losing too much water in your body, such as: Dark pee, very little pee, or no pee. Cracked lips. Dry mouth. Sunken eyes. Sleepiness. Weakness. These symptoms may be an emergency. Get help right away. Call 911. Do not wait to see if the symptoms will go away. Do not drive yourself to the hospital. Summary Nausea is feeling like you are about vomit. If you vomit, or if you are not able to drink enough fluids, you may not have enough water in   your body (get dehydrated). Eat and drink what your doctor tells you. Take over-the-counter and prescription medicines only as told by your doctor. Contact a doctor right away if your symptoms get worse or you have new symptoms. Keep all follow-up visits. This information is not intended to replace advice given to you by your health care provider. Make sure you discuss any questions you have with your health care provider. Document Revised: 10/31/2020 Document Reviewed:  10/31/2020 [employer] Patient Education  2023 [employer]  

## 2022-05-26 ENCOUNTER — Other Ambulatory Visit: Payer: Medicare Other

## 2022-05-26 ENCOUNTER — Other Ambulatory Visit: Payer: Self-pay | Admitting: *Deleted

## 2022-05-26 ENCOUNTER — Encounter: Payer: Medicare Other | Admitting: Physician Assistant

## 2022-05-26 DIAGNOSIS — C561 Malignant neoplasm of right ovary: Secondary | ICD-10-CM

## 2022-05-26 MED ORDER — SODIUM CHLORIDE 0.9 % IV SOLN: Freq: Once | INTRAVENOUS | Status: DC

## 2022-05-26 NOTE — Progress Notes (Signed)
Pt called with c/o nausea and weakness. Had episodes of diarrhea last week but still nausea and not able consume food intake with the nausea. Advised pt that she can be seen in [facility] for fluids and zofran. Pt agreed to appt in [facility] tomorrow at 1145 am. Pt called and notified of appt time and taking labs with genetic lab appt tomorrow as well.

## 2022-05-26 NOTE — Progress Notes (Signed)
Symptom Management Consult note [facility]    Patient Care Team: [name], DO as PCP - General (Family Medicine) Vania Rea, MD as Attending Physician (Obstetrics and Gynecology) Linus Mako, MD as Consulting Physician (Family Medicine) Celestia Khat, [place] (Optometry)    Name of the patient: April Weber  [id number]  1945/11/18   Date of visit: 05/27/2022   Chief Complaint/Reason for visit:  nausea   Current Therapy: carboplatin, docetaxel  Last treatment:  Day 1   Cycle 2 on 05/17/22   ASSESSMENT & PLAN: Patient is a 77 y.o. 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 Alvy Bimler.  I have viewed most recent oncology note, PCP note, and lab work.    #) Right ovarian epithelial cancer  - Here for genetics appointment today. - Next appointment with oncologist is 06/07/22   #) Nausea -Patient is nontoxic-appearing.  She is hemodynamically stable.  She has benign abdominal exam.  She does have dry mucous membranes, appears dehydrated. -Patient given 1L NS.  She had her home medication Compazine with her.  She has been unsure about taking it because of her history of allergic reactions.  She took her p.o. Compazine here in clinic and tolerated it well and now she feels safe taking it at home. -CBC with mild leukopenia WBC 3.4 and ANC 1.0.  Hemoglobin 10.  These decreased values are likely related to chemotherapy.  No intervention is needed at this time however discussed neutropenic precautions with patient.  She has been afebrile at home and is afebrile here today. -CMP shows mild hyponatremia 129 similar to values over the last month, Creatinine 1.17 which is similar to baseline it appears. No other significant derangements.  -Patient tolerating PO intake on reassessment, ate a sandwich.    Strict ED precautions discussed should symptoms worsen.    Heme/Onc History: Oncology History Overview Note  High  grade papillary serous   Right ovarian epithelial cancer ([place])  03/16/2022 Imaging   1. Large, mixed solid and cystic mass in the low pelvis, difficult to clearly delineate from adjacent ascites although at least 10.2 x 10.0 cm. This is highly concerning for primary ovarian malignancy and most likely arises from the right ovary, although may involve both ovaries. This may be further evaluated by contrast enhanced MRI of the pelvis if desired. 2. Large volume ascites throughout the abdomen and pelvis, presumed malignant. Stranding and nodularity of the omentum and peritoneum, particularly in the ventral abdomen and left upper quadrant, highly suspicious for peritoneal metastatic disease. 3. Cirrhotic morphology of the liver, which may contribute to ascites. 4. Severely atrophic left kidney, consistent with remote prior infectious, obstructive, or ischemic insult. 5. Coronary artery disease.   Aortic Atherosclerosis (ICD10-I70.0).   03/17/2022 Imaging   1. There is a large solid and cystic mass within the right adnexa which measures up to 9.5 cm in maximum dimension. Findings are concerning for a primary ovarian neoplasm. 2. Large volume of ascites within the abdomen and pelvis. Given the presence of peritoneal nodules on the CT from the prior day findings are concerning for malignant ascites.   03/19/2022 Procedure   CT-guided biopsy of omental mass.    03/19/2022 Procedure   Successful ultrasound-guided paracentesis yielding 4.2 liters of peritoneal fluid.   03/19/2022 Procedure   Successful placement of a left internal jugular approach power injectable Port-A-Cath. The catheter is ready for immediate use.   03/30/2022 Initial Diagnosis   Ovarian  cancer ([place])   03/30/2022 Pathology Results   A. RIGHT OVARY AND FALLOPIAN TUBE, SALPINGO OOPHORECTOMY: Poorly differentiated carcinoma immunohistochemically most consistent with a high grade serous carcinoma Tumor measures 10.7 x 10.6 x 4.6  cm Tumor involves ovarian and mesosalpinx serosal surfaces and right fallopian tube pT2a pN n/a  Note: The tumor consists of sheets and cords of high-grade nuclei with areas of necrosis and adjacent prominent stroma suggesting papillary cores the overall morphology is slightly unusual and not classic; therefore, a battery of immunohistochemical stains were performed to confirm the diagnosis and exclude a high-grade germ cell tumor.  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 Additionally there is nonspecific focal PLAP positivity; CD117 positivity and D2-40 positivity of uncertain clinical significance.  The tumor is negative for AFP, hCG, CD30, CD31, CK20, glypican-3, and OCT 3/4.  Dr. Saralyn Pilar has peer reviewed the case and agrees with diagnosis. Dr. Alric Seton has also initiated the workup of the case and agrees with the interpretation.  B. OMENTUM, RESECTION: Benign omentum Negative for tumor  C. RIGHT PELVIC SIDEWALL, BIOPSY: Benign mesothelium, fibrovascular stroma and adipose Negative for tumor  D. ANTERIOR CUL DE SAC, BIOPSY: Benign mesothelium Negative for tumor  E. LEFT PELVIC SIDEWALL, BIOPSY: Benign mesothelium and adipose Negative for tumor   ONCOLOGY TABLE:  OVARY or FALLOPIAN TUBE or PRIMARY PERITONEUM: Resection  Procedure: Right salpingo-oophorectomy Specimen Integrity: Grossly focally disrupted Tumor Site: Right ovary Tumor Size: 10.7 x 10.6 x 4.6 cm Histologic Type: Poorly differentiated carcinoma most consistent with a high-grade serous carcinoma Histologic Grade: High-grade Ovarian Surface Involvement: Surface involvement Fallopian Tube Surface Involvement: Mesosalpinx serosal surface involvement and right fallopian tube involvement Implants (required for advanced stage serous/seromucinous borderline tumors only): Not identified Other Tissue/ Organ  Involvement: N/A Largest Extrapelvic Peritoneal Focus: N/A Peritoneal/Ascitic Fluid Involvement: N/A Chemotherapy Response Score (CRS): N/A, no known presurgical therapy Regional Lymph Nodes: N/A, no lymph nodes submitted Distant Metastasis:      Distant Site(s) Involved: N/A Pathologic Stage Classification (pTNM, AJCC 8th Edition): pT2a, pN N/A Ancillary Studies: Can be performed on physician request Representative Tumor Block: A8 Comment(s): [None]    03/30/2022 Surgery   Preop Diagnosis: Pelvic mass, malignant ascites   Postoperative Diagnosis: at least stage IIB ovarian cancer, suspected high grade serous by frozen section   Surgery: Diagnostic laparoscopy, right salpingo-oophorectomy, peritoneal biopsies, infra-colic omentectomy    Surgeon:  Valarie Cones MD    Operative findings: On exam under anesthesia, somewhat mobile mass appreciated in the cul-de-sac to the right.  On agnostic laparoscopy, normal upper abdominal findings other than moderate volume ascites.  Normal-appearing peritoneum, omentum, small bowel.  Right adnexa enlarged with a mass measuring approximately 8-10 cm.  No carcinomatosis appreciated.  Given this, decision made to convert to an open procedure for tumor debulking.  Approximately 5 L of yellow-tinged ascites was removed upon intra-abdominal entry.  Normal upper abdominal survey including smooth diaphragm and liver, stomach, and omentum.  The small bowel was run from the cecum to the ligament of Treitz with no abnormalities or carcinomatosis noted.  Right ovary replaced by an approximately 8 cm smooth mass, dilated and enlarged fallopian tube.  The mass itself was adherent to the right pelvic sidewall, sigmoid colon, and densely adherent to the anterior cul-de-sac/bladder peritoneum.  Frozen section consistent with invasive carcinoma, likely high-grade.  Left tube and ovary were surgically absent.  Sigmoid colon with adhesions  to the left pelvic sidewall, left aspect  of the bladder and the left vaginal cuff.  Somewhat redundant sigmoid which looped over on itself secondary to these adhesions.  Numerous diverticula noted of the sigmoid colon as well as the transverse colon.  The end of surgery, an R0 resection was accomplished.     04/13/2022 Cancer Staging   Staging form: Ovary, Fallopian Tube, and Primary Peritoneal Carcinoma, AJCC 8th Edition - Pathologic stage from 04/13/2022: Stage II (pT2, pN0, cM0) - Signed by [name], MD on 04/13/2022 Stage prefix: Initial diagnosis   04/26/2022 -  Chemotherapy   Patient is on Treatment Plan : OVARIAN Carboplatin (AUC 6) + Paclitaxel (175) q21d X 6 Cycles     05/17/2022 Tumor Marker   Patient's tumor was tested for the following markers: CA-125. Results of the tumor marker test revealed 12.7.       Interval history-: CICILIA CLINGER is a 77 y.o. female with oncologic history as above presenting to [facility] today with chief complaint of generalized weakness and nausea.  She presents unaccompanied to clinic today.  Patient states her symptoms have been going on x 7 days.  She states today is actually the first day she is feeling slightly improved.  Patient reports having decreased appetite.  She was nervous to take her Zofran at home because of her allergic reactions during infusions in the past.  She did take her p.o. Zofran while at the PCP office on 05/24/2022 without any adverse effects.  She now feels as if she can take it safely at home.  She took Zofran yesterday afternoon and nausea has drastically improved.  She was able to eat a peach cup without any difficulty.  In the last 24 hours she has drank 40 ounces of fluids.  She usually drinks 60 ounces per day.  For the generalized weakness patient describes it as just feeling puny.  She did have diarrhea x 4 days ago but after taking Imodium that has resolved.  She has not had any sick contacts.  She denies any fever, chills, abdominal pain, urinary  symptoms.      ROS  All other systems are reviewed and are negative for acute change except as noted in the HPI.    Allergies  Allergen Reactions   Paclitaxel Anaphylaxis   Emend [Fosaprepitant Dimeglumine] Other (See Comments)    3 minutes into emend infusion patient began having facial flushing and redness to bilateral hands and neck, and light headedness   Codeine Nausea And Vomiting   Nsaids Other (See Comments)    Told to avoid due to kidney function   Penicillins Hives   Vibra-Tab [Doxycycline] Hives   Iodine Rash   Latex Rash    Occasionally gets a localized rash on contact with certain latex products     Past Medical History:  Diagnosis Date   Arthritis    Breast cancer ([place]) 1980   RIGHT   Chronic kidney disease    has one kidney   DVT (deep venous thrombosis) ([facility])    on RIGHT leg-hx of   Dyspnea    Hyperlipidemia    on meds   Hypertension    on meds   Osteopenia    Peripheral vascular disease ([facility])    PONV (postoperative nausea and vomiting)    Seasonal allergies    Thyroid disease    on meds     Past Surgical History:  Procedure Laterality Date   ABDOMINAL HYSTERECTOMY  APPENDECTOMY     BREAST BIOPSY     BREAST LUMPECTOMY WITH RADIOACTIVE SEED LOCALIZATION Left 12/01/2021   Procedure: LEFT BREAST RADIOACTIVE SEED GUIDED LUMPECTOMY;  Surgeon: Coralie Keens, MD;  Location: [facility];  Service: General;  Laterality: Left;  LMA   COLONOSCOPY  2011   Dr.[email or web address]   DEBULKING N/A 03/30/2022   Procedure: TUMOR DEBULKING;  Surgeon: Lafonda Mosses, MD;  Location: [facility];  Service: Gynecology;  Laterality: N/A;   EYE SURGERY     77 years old   IR IMAGING GUIDED PORT INSERTION  03/19/2022   LAPAROSCOPY N/A 03/30/2022   Procedure: LAPAROSCOPY DIAGNOSTIC;  Surgeon: Lafonda Mosses, MD;  Location: [facility];  Service: Gynecology;  Laterality: N/A;   MASTECTOMY Right 1984   ROTATOR CUFF REPAIR     SALPINGOOPHORECTOMY  Bilateral 03/30/2022   Procedure: OPEN SALPINGO OOPHORECTOMY;  Surgeon: Lafonda Mosses, MD;  Location: [facility];  Service: Gynecology;  Laterality: Bilateral;   THYROID SURGERY Bilateral 1967   VARICOSE VEIN SURGERY     ablation   WISDOM TOOTH EXTRACTION      Social History   Socioeconomic History   Marital status: Single    Spouse name: Divorced    Number of children: 1   Years of education: Not on file   Highest education level: Not on file  Occupational History   Occupation: Retired    Comment: worked at [employer] in [employer]  Tobacco Use   Smoking status: Former    Packs/day: 0.25    Types: Cigarettes    Quit date: 05/11/1967    Years since quitting: 55.0   Smokeless tobacco: Never  Vaping Use   Vaping Use: Never used  Substance and Sexual Activity   Alcohol use: No   Drug use: No   Sexual activity: Not Currently    Birth control/protection: Post-menopausal, Surgical    Comment: hyst  Other Topics Concern   Not on file  Social History Narrative   Lives alone - very close with her neighbors   Social Determinants of Health   Financial Resource Strain: Low Risk  (04/08/2022)   Overall Financial Resource Strain (CARDIA)    Difficulty of Paying Living Expenses: Not very hard  Food Insecurity: No Food Insecurity (03/31/2022)   Hunger Vital Sign    Worried About Running Out of Food in the Last Year: Never true    Force in the Last Year: Never true  Transportation Needs: No Transportation Needs (04/08/2022)   PRAPARE - Hydrologist (Medical): No    Lack of Transportation (Non-Medical): No  Physical Activity: Sufficiently Active (08/26/2021)   Exercise Vital Sign    Days of Exercise per Week: 7 days    Minutes of Exercise per Session: 30 min  Stress: No Stress Concern Present (08/26/2021)   [place]    Feeling of Stress : Not at  all  Social Connections: Moderately Integrated (08/26/2021)   Social Connection and Isolation Panel [NHANES]    Frequency of Communication with Friends and Family: More than three times a week    Frequency of Social Gatherings with Friends and Family: More than three times a week    Attends Religious Services: More than 4 times per year    Active Member of [employer] or Organizations: Yes    Attends Archivist Meetings: More than 4 times per year  Marital Status: Divorced  Human resources officer Violence: Not At Risk (03/31/2022)   Humiliation, Afraid, Rape, and Kick questionnaire    Fear of Current or Ex-Partner: No    Emotionally Abused: No    Physically Abused: No    Sexually Abused: No    Family History  Problem Relation Age of Onset   Heart attack Mother    Alcohol abuse Mother    Brain cancer Mother    Lung cancer Mother    Breast cancer Maternal Aunt    Colon polyps Neg Hx    Colon cancer Neg Hx    Esophageal cancer Neg Hx    Stomach cancer Neg Hx    Rectal cancer Neg Hx      Current Outpatient Medications:    acetaminophen (TYLENOL) 500 MG tablet, Take 500-1,000 mg by mouth daily as needed for moderate pain, fever or headache., Disp: , Rfl:    alendronate (FOSAMAX) 70 MG tablet, Take 70 mg by mouth once a week. Take with a full glass of water on an empty stomach., Disp: , Rfl:    amLODipine (NORVASC) 5 MG tablet, Take 1 tablet (5 mg total) by mouth daily. (Patient taking differently: Take 5 mg by mouth in the morning.), Disp: 90 tablet, Rfl: 3   aspirin EC 81 MG tablet, Take 81 mg by mouth daily. Swallow whole., Disp: , Rfl:    atenolol (TENORMIN) 25 MG tablet, Take 1 tablet (25 mg total) by mouth daily. (Patient taking differently: Take 12.5 mg by mouth 2 (two) times daily.), Disp: 90 tablet, Rfl: 3   Calcium Carb-Cholecalciferol (CALCIUM + VITAMIN D3 PO), Take 1 tablet by mouth 2 (two) times daily., Disp: , Rfl:    dexamethasone (DECADRON) 4 MG tablet, Take 2 tabs at  the day before and 1 tab daily for 2 days after chemotherapy, Disp: 36 tablet, Rfl: 6   EPINEPHrine 0.3 mg/0.3 mL IJ SOAJ injection, Inject 0.3 mg into the muscle as needed for anaphylaxis., Disp: 2 each, Rfl: 2   levothyroxine (SYNTHROID) 100 MCG tablet, Take 1/2 tablet on Sundays and 1 tablet daily all other days (Patient taking differently: Take 100 mcg by mouth in the morning.), Disp: 90 tablet, Rfl: 3   lidocaine-prilocaine (EMLA) cream, Apply to affected area once, Disp: 30 g, Rfl: 3   loratadine (CLARITIN) 10 MG tablet, Take 10 mg by mouth at bedtime., Disp: , Rfl:    losartan (COZAAR) 100 MG tablet, Take 100 mg by mouth daily., Disp: , Rfl:    Multiple Vitamin (MULTIVITAMIN) tablet, Take 1 tablet by mouth in the morning., Disp: , Rfl:    ondansetron (ZOFRAN) 8 MG tablet, Take 1 tablet (8 mg total) by mouth every 8 (eight) hours as needed for nausea or vomiting. Start on the third day after chemotherapy., Disp: 30 tablet, Rfl: 1   prochlorperazine (COMPAZINE) 10 MG tablet, Take 1 tablet (10 mg total) by mouth every 6 (six) hours as needed for nausea or vomiting., Disp: 30 tablet, Rfl: 1   simvastatin (ZOCOR) 20 MG tablet, Take 1 tablet (20 mg total) by mouth at bedtime. Cancel rx for '10mg'$ ., Disp: 90 tablet, Rfl: 3   simvastatin (ZOCOR) 20 MG tablet, Take 20 mg by mouth daily., Disp: , Rfl:   PHYSICAL EXAM: ECOG FS:1 - Symptomatic but completely ambulatory    Vitals:   05/27/22 1105 05/27/22 1350  BP: 119/70 133/60  Pulse: 98 86  Resp: 18 16  Temp: 98.3 F (36.8 C)   TempSrc: Temporal  SpO2: 100% 100%  Weight: 155 lb (70.3 kg)   Height: '5\' 7"'$  (1.702 m)    Physical Exam Vitals and nursing note reviewed.  Constitutional:      Appearance: She is well-developed. She is not ill-appearing or toxic-appearing.  HENT:     Head: Normocephalic.     Nose: Nose normal.     Mouth/Throat:     Mouth: Mucous membranes are dry.  Eyes:     Conjunctiva/sclera: Conjunctivae normal.  Neck:      Vascular: No JVD.  Cardiovascular:     Rate and Rhythm: Normal rate and regular rhythm.     Pulses: Normal pulses.     Heart sounds: Normal heart sounds.  Pulmonary:     Effort: Pulmonary effort is normal.     Breath sounds: Normal breath sounds.  Abdominal:     General: Bowel sounds are normal. There is no distension.     Palpations: Abdomen is soft. There is no mass.     Tenderness: There is no abdominal tenderness. There is no right CVA tenderness, left CVA tenderness, guarding or rebound.     Hernia: No hernia is present.  Musculoskeletal:     Cervical back: Normal range of motion.  Skin:    General: Skin is warm and dry.  Neurological:     Mental Status: She is oriented to person, place, and time.     Comments: No focal weakness. Strong and equal grip strength in all extremities. No facial droop, clear speech        LABORATORY DATA: I have reviewed the data as listed    Latest Ref Rng & Units 05/27/2022   10:41 AM 05/17/2022    8:25 AM 04/20/2022    2:06 PM  CBC  WBC 4.0 - 10.5 K/uL 3.4  8.6  8.1   Hemoglobin 12.0 - 15.0 g/dL 10.0  12.1  12.7   Hematocrit 36.0 - 46.0 % 26.9  34.3  38.0   Platelets 150 - 400 K/uL 203  179  341         Latest Ref Rng & Units 05/27/2022   10:41 AM 05/17/2022    8:25 AM 04/26/2022    7:34 AM  CMP  Glucose 70 - 99 mg/dL 107  146  204   BUN 8 - 23 mg/dL '13  21  20   '$ Creatinine 0.44 - 1.00 mg/dL 1.17  1.20  1.09   Sodium 135 - 145 mmol/L 129  135  135   Potassium 3.5 - 5.1 mmol/L 3.6  4.0  4.0   Chloride 98 - 111 mmol/L 93  102  102   CO2 22 - 32 mmol/L '28  25  24   '$ Calcium 8.9 - 10.3 mg/dL 8.6  9.6  9.7   Total Protein 6.5 - 8.1 g/dL 5.8  6.4  6.6   Total Bilirubin 0.3 - 1.2 mg/dL 0.5  0.4  0.7   Alkaline Phos 38 - 126 U/L 78  115  416   AST 15 - 41 U/L 14  15  43   ALT 0 - 44 U/L 13  13  104        RADIOGRAPHIC STUDIES (from last 24 hours if applicable) I have personally reviewed the radiological images as listed and  agreed with the findings in the report. No results found.      Visit Diagnosis: 1. Right ovarian epithelial cancer ([place])   2. Nausea without vomiting   3. Port-A-Cath in place  No orders of the defined types were placed in this encounter.   All questions were answered. The patient knows to call the clinic with any problems, questions or concerns. No barriers to learning was detected.  I have spent a total of 20 minutes minutes of face-to-face and non-face-to-face time, preparing to see the patient, obtaining and/or reviewing separately obtained history, performing a medically appropriate examination, counseling and educating the patient, ordering tests, documenting clinical information in the electronic health record,.  Thank you for allowing me to participate in the care of this patient.    Barrie Folk, PA-C Department of Hematology/Oncology [facility] at [facility] Phone: [phone]  [phone]    05/27/2022 2:32 PM

## 2022-05-27 ENCOUNTER — Inpatient Hospital Stay (HOSPITAL_BASED_OUTPATIENT_CLINIC_OR_DEPARTMENT_OTHER): Payer: Medicare Other | Admitting: Physician Assistant

## 2022-05-27 ENCOUNTER — Inpatient Hospital Stay (HOSPITAL_BASED_OUTPATIENT_CLINIC_OR_DEPARTMENT_OTHER): Payer: Medicare Other | Admitting: Genetic Counselor

## 2022-05-27 ENCOUNTER — Other Ambulatory Visit: Payer: Self-pay

## 2022-05-27 ENCOUNTER — Inpatient Hospital Stay: Payer: Medicare Other

## 2022-05-27 ENCOUNTER — Other Ambulatory Visit: Payer: Self-pay | Admitting: Genetic Counselor

## 2022-05-27 VITALS — BP 133/60 | HR 86 | Temp 98.3°F | Resp 16 | Ht 67.0 in | Wt 155.0 lb

## 2022-05-27 DIAGNOSIS — C561 Malignant neoplasm of right ovary: Secondary | ICD-10-CM

## 2022-05-27 DIAGNOSIS — Z803 Family history of malignant neoplasm of breast: Secondary | ICD-10-CM

## 2022-05-27 DIAGNOSIS — Z95828 Presence of other vascular implants and grafts: Secondary | ICD-10-CM

## 2022-05-27 DIAGNOSIS — Z5111 Encounter for antineoplastic chemotherapy: Secondary | ICD-10-CM | POA: Diagnosis not present

## 2022-05-27 DIAGNOSIS — R110 Nausea: Secondary | ICD-10-CM

## 2022-05-27 DIAGNOSIS — E860 Dehydration: Secondary | ICD-10-CM | POA: Diagnosis not present

## 2022-05-27 DIAGNOSIS — R634 Abnormal weight loss: Secondary | ICD-10-CM | POA: Diagnosis not present

## 2022-05-27 DIAGNOSIS — Z853 Personal history of malignant neoplasm of breast: Secondary | ICD-10-CM

## 2022-05-27 DIAGNOSIS — R531 Weakness: Secondary | ICD-10-CM | POA: Diagnosis not present

## 2022-05-27 LAB — CBC WITH DIFFERENTIAL (CANCER CENTER ONLY)
Abs Immature Granulocytes: 0.11 10*3/uL — ABNORMAL HIGH (ref 0.00–0.07)
Basophils Absolute: 0 10*3/uL (ref 0.0–0.1)
Basophils Relative: 1 %
Eosinophils Absolute: 0 10*3/uL (ref 0.0–0.5)
Eosinophils Relative: 0 %
HCT: 26.9 % — ABNORMAL LOW (ref 36.0–46.0)
Hemoglobin: 10 g/dL — ABNORMAL LOW (ref 12.0–15.0)
Immature Granulocytes: 3 %
Lymphocytes Relative: 18 %
Lymphs Abs: 0.6 10*3/uL — ABNORMAL LOW (ref 0.7–4.0)
MCH: 32.1 pg (ref 26.0–34.0)
MCHC: 37.2 g/dL — ABNORMAL HIGH (ref 30.0–36.0)
MCV: 86.2 fL (ref 80.0–100.0)
Monocytes Absolute: 1.6 10*3/uL — ABNORMAL HIGH (ref 0.1–1.0)
Monocytes Relative: 48 %
Neutro Abs: 1 10*3/uL — ABNORMAL LOW (ref 1.7–7.7)
Neutrophils Relative %: 30 %
Platelet Count: 203 10*3/uL (ref 150–400)
RBC: 3.12 MIL/uL — ABNORMAL LOW (ref 3.87–5.11)
RDW: 11.9 % (ref 11.5–15.5)
Smear Review: NORMAL
WBC Count: 3.4 10*3/uL — ABNORMAL LOW (ref 4.0–10.5)
nRBC: 0 % (ref 0.0–0.2)

## 2022-05-27 LAB — CMP (CANCER CENTER ONLY)
ALT: 13 U/L (ref 0–44)
AST: 14 U/L — ABNORMAL LOW (ref 15–41)
Albumin: 3.2 g/dL — ABNORMAL LOW (ref 3.5–5.0)
Alkaline Phosphatase: 78 U/L (ref 38–126)
Anion gap: 8 (ref 5–15)
BUN: 13 mg/dL (ref 8–23)
CO2: 28 mmol/L (ref 22–32)
Calcium: 8.6 mg/dL — ABNORMAL LOW (ref 8.9–10.3)
Chloride: 93 mmol/L — ABNORMAL LOW (ref 98–111)
Creatinine: 1.17 mg/dL — ABNORMAL HIGH (ref 0.44–1.00)
GFR, Estimated: 48 mL/min — ABNORMAL LOW (ref >60)
Glucose, Bld: 107 mg/dL — ABNORMAL HIGH (ref 70–99)
Potassium: 3.6 mmol/L (ref 3.5–5.1)
Sodium: 129 mmol/L — ABNORMAL LOW (ref 135–145)
Total Bilirubin: 0.5 mg/dL (ref 0.3–1.2)
Total Protein: 5.8 g/dL — ABNORMAL LOW (ref 6.5–8.1)

## 2022-05-27 LAB — GENETIC SCREENING ORDER

## 2022-05-27 MED ORDER — SODIUM CHLORIDE 0.9 % IV SOLN: Freq: Once | INTRAVENOUS | Status: AC

## 2022-05-27 NOTE — Patient Instructions (Signed)

## 2022-05-27 NOTE — Progress Notes (Signed)
Error. Please see office note from today 05/27/22 for full documentation.

## 2022-05-27 NOTE — Progress Notes (Signed)
Patient stated she prefers PIV instead of port access due to not having numbing cream on.

## 2022-05-28 ENCOUNTER — Encounter: Payer: Self-pay | Admitting: Hematology and Oncology

## 2022-05-28 ENCOUNTER — Encounter: Payer: Self-pay | Admitting: Genetic Counselor

## 2022-05-28 NOTE — Progress Notes (Signed)
REFERRING PROVIDER: Lafonda Mosses, MD [place],  [place] [address]  PRIMARY PROVIDER:  [name], DO  PRIMARY REASON FOR VISIT:  1. 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 History of breast cancer   3. Family history of breast cancer      HISTORY OF PRESENT ILLNESS:   April Weber, a 77 y.o. female, was seen for a [place] cancer genetics consultation at the request of Dr. Berline Lopes due to a personal history of ovarian cancer.  April Weber presents to clinic today to discuss the possibility of a hereditary predisposition to cancer, to discuss genetic testing, and to further clarify her future cancer risks, as well as potential cancer risks for family members.   In November 2023, at the age of 39, April Weber was diagnosed with right epithelial ovarian cancer.  She has a history of right breast cancer in her 78s s/p right mastectomy.  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 Large, mixed solid and cystic mass in the low pelvis, difficult to clearly delineate from adjacent ascites although at least 10.2 x 10.0 cm. This is highly concerning for primary ovarian malignancy and most likely arises from the right ovary, although may involve both ovaries. This may be further evaluated by contrast enhanced MRI of the pelvis if desired. 2. Large volume ascites throughout the abdomen and pelvis, presumed malignant. Stranding and nodularity of the omentum and peritoneum, particularly in the ventral abdomen and left upper quadrant, highly suspicious for peritoneal metastatic disease. 3. Cirrhotic morphology of the liver, which may contribute to ascites. 4. Severely atrophic left kidney, consistent with remote prior infectious, obstructive, or ischemic insult. 5. Coronary artery disease.   Aortic Atherosclerosis (ICD10-I70.0).   03/17/2022 Imaging   1. There is a large solid  and cystic mass within the right adnexa which measures up to 9.5 cm in maximum dimension. Findings are concerning for a primary ovarian neoplasm. 2. Large volume of ascites within the abdomen and pelvis. Given the presence of peritoneal nodules on the CT from the prior day findings are concerning for malignant ascites.   03/19/2022 Procedure   CT-guided biopsy of omental mass.    03/19/2022 Procedure   Successful ultrasound-guided paracentesis yielding 4.2 liters of peritoneal fluid.   03/19/2022 Procedure   Successful placement of a left internal jugular approach power injectable Port-A-Cath. The catheter is ready for immediate use.   03/30/2022 Initial Diagnosis   Ovarian cancer ([place])   03/30/2022 Pathology Results   A. RIGHT OVARY AND FALLOPIAN TUBE, SALPINGO OOPHORECTOMY: Poorly differentiated carcinoma immunohistochemically most consistent with a high grade serous carcinoma Tumor measures 10.7 x 10.6 x 4.6 cm Tumor involves ovarian and mesosalpinx serosal surfaces and right fallopian tube pT2a pN n/a  Note: The tumor consists of sheets and cords of high-grade nuclei with areas of necrosis and adjacent prominent stroma suggesting papillary cores the overall morphology is slightly unusual and not classic; therefore, a battery of immunohistochemical stains were performed to confirm the diagnosis and exclude a high-grade germ cell tumor.  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 Additionally there is nonspecific focal PLAP positivity; CD117 positivity and D2-40 positivity of uncertain clinical significance.  The tumor is negative for AFP, hCG, CD30, CD31, CK20, glypican-3, and OCT 3/4.  Dr. Saralyn Pilar  has peer reviewed the case and agrees with diagnosis. Dr. Alric Seton has also initiated the workup of the case and agrees with the interpretation.  B. OMENTUM, RESECTION: Benign  omentum Negative for tumor  C. RIGHT PELVIC SIDEWALL, BIOPSY: Benign mesothelium, fibrovascular stroma and adipose Negative for tumor  D. ANTERIOR CUL DE SAC, BIOPSY: Benign mesothelium Negative for tumor  E. LEFT PELVIC SIDEWALL, BIOPSY: Benign mesothelium and adipose Negative for tumor   ONCOLOGY TABLE:  OVARY or FALLOPIAN TUBE or PRIMARY PERITONEUM: Resection  Procedure: Right salpingo-oophorectomy Specimen Integrity: Grossly focally disrupted Tumor Site: Right ovary Tumor Size: 10.7 x 10.6 x 4.6 cm Histologic Type: Poorly differentiated carcinoma most consistent with a high-grade serous carcinoma Histologic Grade: High-grade Ovarian Surface Involvement: Surface involvement Fallopian Tube Surface Involvement: Mesosalpinx serosal surface involvement and right fallopian tube involvement Implants (required for advanced stage serous/seromucinous borderline tumors only): Not identified Other Tissue/ Organ Involvement: N/A Largest Extrapelvic Peritoneal Focus: N/A Peritoneal/Ascitic Fluid Involvement: N/A Chemotherapy Response Score (CRS): N/A, no known presurgical therapy Regional Lymph Nodes: N/A, no lymph nodes submitted Distant Metastasis:      Distant Site(s) Involved: N/A Pathologic Stage Classification (pTNM, AJCC 8th Edition): pT2a, pN N/A Ancillary Studies: Can be performed on physician request Representative Tumor Block: A8 Comment(s): [None]    03/30/2022 Surgery   Preop Diagnosis: Pelvic mass, malignant ascites   Postoperative Diagnosis: at least stage IIB ovarian cancer, suspected high grade serous by frozen section   Surgery: Diagnostic laparoscopy, right salpingo-oophorectomy, peritoneal biopsies, infra-colic omentectomy    Surgeon:  Valarie Cones MD    Operative findings: On exam under anesthesia, somewhat mobile mass appreciated in the cul-de-sac to the right.  On agnostic laparoscopy, normal upper abdominal findings other than moderate volume ascites.   Normal-appearing peritoneum, omentum, small bowel.  Right adnexa enlarged with a mass measuring approximately 8-10 cm.  No carcinomatosis appreciated.  Given this, decision made to convert to an open procedure for tumor debulking.  Approximately 5 L of yellow-tinged ascites was removed upon intra-abdominal entry.  Normal upper abdominal survey including smooth diaphragm and liver, stomach, and omentum.  The small bowel was run from the cecum to the ligament of Treitz with no abnormalities or carcinomatosis noted.  Right ovary replaced by an approximately 8 cm smooth mass, dilated and enlarged fallopian tube.  The mass itself was adherent to the right pelvic sidewall, sigmoid colon, and densely adherent to the anterior cul-de-sac/bladder peritoneum.  Frozen section consistent with invasive carcinoma, likely high-grade.  Left tube and ovary were surgically absent.  Sigmoid colon with adhesions to the left pelvic sidewall, left aspect of the bladder and the left vaginal cuff.  Somewhat redundant sigmoid which looped over on itself secondary to these adhesions.  Numerous diverticula noted of the sigmoid colon as well as the transverse colon.  The end of surgery, an R0 resection was accomplished.     04/13/2022 Cancer Staging   Staging form: Ovary, Fallopian Tube, and Primary Peritoneal Carcinoma, AJCC 8th Edition - Pathologic stage from 04/13/2022: Stage II (pT2, pN0, cM0) - Signed by [name], MD on 04/13/2022 Stage prefix: Initial diagnosis   04/26/2022 -  Chemotherapy   Patient is on Treatment Plan : OVARIAN Carboplatin (AUC 6) + Paclitaxel (175) q21d X 6 Cycles     05/17/2022 Tumor Marker   Patient's tumor was tested for the following markers: CA-125. Results of the tumor marker test revealed 12.7.      Past Medical History:  Diagnosis Date   Arthritis  Breast cancer ([place]) 1980   RIGHT   Chronic kidney disease    has one kidney   DVT (deep venous thrombosis) ([facility])    on RIGHT leg-hx of    Dyspnea    Hyperlipidemia    on meds   Hypertension    on meds   Osteopenia    Peripheral vascular disease ([facility])    PONV (postoperative nausea and vomiting)    Seasonal allergies    Thyroid disease    on meds    Past Surgical History:  Procedure Laterality Date   ABDOMINAL HYSTERECTOMY     APPENDECTOMY     BREAST BIOPSY     BREAST LUMPECTOMY WITH RADIOACTIVE SEED LOCALIZATION Left 12/01/2021   Procedure: LEFT BREAST RADIOACTIVE SEED GUIDED LUMPECTOMY;  Surgeon: Coralie Keens, MD;  Location: [facility];  Service: General;  Laterality: Left;  LMA   COLONOSCOPY  2011   Dr.[email or web address]-[email or web address]   DEBULKING N/A 03/30/2022   Procedure: TUMOR DEBULKING;  Surgeon: Lafonda Mosses, MD;  Location: [facility];  Service: Gynecology;  Laterality: N/A;   EYE SURGERY     77 years old   IR IMAGING GUIDED PORT INSERTION  03/19/2022   LAPAROSCOPY N/A 03/30/2022   Procedure: LAPAROSCOPY DIAGNOSTIC;  Surgeon: Lafonda Mosses, MD;  Location: [facility];  Service: Gynecology;  Laterality: N/A;   MASTECTOMY Right 1984   ROTATOR CUFF REPAIR     SALPINGOOPHORECTOMY Bilateral 03/30/2022   Procedure: OPEN SALPINGO OOPHORECTOMY;  Surgeon: Lafonda Mosses, MD;  Location: [facility];  Service: Gynecology;  Laterality: Bilateral;   THYROID SURGERY Bilateral 1967   VARICOSE VEIN SURGERY     ablation   WISDOM TOOTH EXTRACTION      FAMILY HISTORY:  We obtained a detailed, 4-generation family history.  Significant diagnoses are listed below: Family History  Problem Relation Age of Onset   Lung cancer Mother        d. mid 1s   Breast cancer Maternal Aunt 38     April Weber reported that her daughter, Darrick Penna, may have had negative BRCA1/2 genetic testing previously.  No report was available for review today.  April Weber reported limited information about extended relatives (aunts, uncles, cousins, etc).  There is no reported Ashkenazi Jewish ancestry. There is no known  consanguinity.  GENETIC COUNSELING ASSESSMENT: April Weber is a 77 y.o. female with a personal history which is somewhat suggestive of a hereditary cancer syndrome given her history of both breast and ovarian cancer. We, therefore, discussed and recommended the following at today's visit.   DISCUSSION: We discussed that 5 - 10% of cancer is hereditary.  Most cases of hereditary breast and ovarian cancer are associated with mutations in BRCA1/2.  There are other genes that can be associated with hereditary breast or ovarian cancer syndromes.  We discussed that testing is beneficial for several reasons including knowing how to follow individuals for their cancer risks, identifying whether potential treatment options, such as PARP inhibitors, would be beneficial, and understanding if other family members could be at risk for cancer and allowing them to undergo genetic testing.   We reviewed the characteristics, features and inheritance patterns of hereditary cancer syndromes. We also discussed genetic testing, including the appropriate family members to test, the process of testing, insurance coverage and turn-around-time for results. We discussed the implications of a negative, positive, carrier and/or variant of uncertain significant result. We recommended April Weber pursue genetic testing for a panel that includes genes associated with  breast, ovarian, and other cancers.   The Multi-Cancer + RNA Panel offered by [facility] includes sequencing and/or deletion/duplication analysis of the following 70 genes:  AIP*, ALK, APC*, ATM*, AXIN2*, BAP1*, BARD1*, BLM*, BMPR1A*, BRCA1*, BRCA2*, BRIP1*, CDC73*, CDH1*, CDK4, CDKN1B*, CDKN2A, CHEK2*, CTNNA1*, DICER1*, EPCAM (del/dup only), EGFR, FH*, FLCN*, GREM1 (promoter dup only), HOXB13, KIT, LZTR1, MAX*, MBD4, MEN1*, MET, MITF, MLH1*, MSH2*, MSH3*, MSH6*, MUTYH*, NF1*, NF2*, NTHL1*, PALB2*, PDGFRA, PMS2*, POLD1*, POLE*, POT1*, PRKAR1A*, PTCH1*, PTEN*, RAD51C*, RAD51D*, RB1*,  RET, SDHA* (sequencing only), SDHAF2*, SDHB*, SDHC*, SDHD*, SMAD4*, SMARCA4*, SMARCB1*, SMARCE1*, STK11*, SUFU*, TMEM127*, TP53*, TSC1*, TSC2*, VHL*. RNA analysis is performed for * genes.    We discussed that genetic testing through [facility] will test for hereditary mutations that could explain her diagnosis of cancer.  However, homologous recombination testing (HRD) is genetic testing performed on the tumor that can determine genetic changes that could influence her management such as eligibility for targeted therapies. [facility] includes sequencing and large rearrangement analysis of BRCA1/2 and genomic instability status through loss of heterozygosity (LOH), telomeric allelic imbalance (TAI) and large-scale state transitions (LST).    Based on April Weber's personal history of ovarian cancer as well has her history of breast cancer in her 40s, she meets medical criteria for genetic testing.   PLAN: After considering the risks, benefits, and limitations, April Weber provided informed consent to pursue genetic testing and the blood sample was sent to [facility] for analysis of the Multi-Cancer +RNA Panel.  Order will be sent to [facility] who will request tissue from the pathology laboratory for HRD testing.   Results should be available within approximately 3 weeks for germline testing and 4-6 weeks for HRD testing, at which point they will be disclosed by telephone to April Weber.   April Weber questions were answered to her satisfaction today. Our contact information was provided should additional questions or concerns arise. Thank you for the referral and allowing [place] to share in the care of your patient.   Somya Jauregui M. Joette Catching, [place], [facility] Genetic Counselor [email or web address] [phone]  The patient was seen for a total of 35 minutes in face-to-face genetic counseling.  The patient was seen alone.  Drs. Lindi Adie and/or Burr Medico were available to discuss this case  as needed.    _______________________________________________________________________ For Office Staff:  Number of people involved in session: 1 Was an Intern/ student involved with case: no

## 2022-05-31 DIAGNOSIS — C561 Malignant neoplasm of right ovary: Secondary | ICD-10-CM | POA: Diagnosis not present

## 2022-06-01 ENCOUNTER — Encounter: Payer: Self-pay | Admitting: Family Medicine

## 2022-06-01 ENCOUNTER — Ambulatory Visit (INDEPENDENT_AMBULATORY_CARE_PROVIDER_SITE_OTHER): Payer: Medicare Other | Admitting: Family Medicine

## 2022-06-01 VITALS — BP 130/84 | HR 85 | Temp 98.8°F | Ht 67.0 in | Wt 155.2 lb

## 2022-06-01 DIAGNOSIS — I10 Essential (primary) hypertension: Secondary | ICD-10-CM

## 2022-06-01 DIAGNOSIS — M8580 Other specified disorders of bone density and structure, unspecified site: Secondary | ICD-10-CM

## 2022-06-01 DIAGNOSIS — E7800 Pure hypercholesterolemia, unspecified: Secondary | ICD-10-CM | POA: Diagnosis not present

## 2022-06-01 DIAGNOSIS — I129 Hypertensive chronic kidney disease with stage 1 through stage 4 chronic kidney disease, or unspecified chronic kidney disease: Secondary | ICD-10-CM | POA: Diagnosis not present

## 2022-06-01 DIAGNOSIS — E890 Postprocedural hypothyroidism: Secondary | ICD-10-CM

## 2022-06-01 DIAGNOSIS — N1831 Chronic kidney disease, stage 3a: Secondary | ICD-10-CM

## 2022-06-01 DIAGNOSIS — C561 Malignant neoplasm of right ovary: Secondary | ICD-10-CM | POA: Diagnosis not present

## 2022-06-01 MED ORDER — LEVOTHYROXINE SODIUM 100 MCG PO TABS: 100.0000 ug | ORAL_TABLET | Freq: Every morning | ORAL | 3 refills | Status: DC

## 2022-06-01 MED ORDER — ATENOLOL 25 MG PO TABS: 12.5000 mg | ORAL_TABLET | Freq: Two times a day (BID) | ORAL | 3 refills | Status: DC

## 2022-06-01 MED ORDER — AMLODIPINE BESYLATE 5 MG PO TABS: 5.0000 mg | ORAL_TABLET | Freq: Every morning | ORAL | 3 refills | Status: DC

## 2022-06-01 MED ORDER — SIMVASTATIN 20 MG PO TABS: 20.0000 mg | ORAL_TABLET | Freq: Every day | ORAL | 3 refills | Status: DC

## 2022-06-01 NOTE — Progress Notes (Signed)
Subjective: April Weber follow up PCP: April Norlander, DO [id number] April Weber is April 77 y.o. female presenting to clinic today for:  1. Hypothyroidism Compliant with meds. Taking 129mg daily. No tremor.  Reports stress surrounding recent ovarian cancer diagnosis and treatment. She is on 3/6 chemo treatments.  Reports hair loss.  Considering shaving it off.    2. CKD associated with HTN, ovarian Cancer Compliant with norvasc and atenolol but losartan dc'd.  She's lost >10lbs since cancer dx so BP was staying controlled without.  No CP, SOB.  Reports some nausea due to treatment for cancer.  Taking Zofran for this.  BP running 109-142/60-70s  3. Osteopenia with high risk of fracture Stopped fosamax for now.  She reports weight loss as above. Not able to exercise as she had been due to side effects of chemo.  ROS: Per HPI  Allergies  Allergen Reactions   Paclitaxel Anaphylaxis   Emend [Fosaprepitant Dimeglumine] Other (See Comments)    3 minutes into emend infusion patient began having facial flushing and redness to bilateral hands and neck, and light headedness   Codeine Nausea And Vomiting   Nsaids Other (See Comments)    Told to avoid due to kidney function   Penicillins Hives   Vibra-Tab [Doxycycline] Hives   Iodine Rash   Latex Rash    Occasionally gets April localized rash on contact with certain latex products   Past Medical History:  Diagnosis Date   Arthritis    [place]   RIGHT   Chronic kidney disease    has one kidney   DVT (deep venous thrombosis) (HCC)    on RIGHT leg-hx of   Dyspnea    Hyperlipidemia    on meds   Hypertension    on meds   Osteopenia    Peripheral vascular disease ([facility])    PONV (postoperative nausea and vomiting)    Seasonal allergies    Thyroid disease    on meds    Current Outpatient Medications:    acetaminophen (TYLENOL) 500 MG tablet, Take 500-1,000 mg by mouth daily as needed for moderate pain, fever or  headache., Disp: , Rfl:    alendronate (FOSAMAX) 70 MG tablet, Take 70 mg by mouth once April week. Take with April full glass of water on an empty stomach., Disp: , Rfl:    amLODipine (NORVASC) 5 MG tablet, Take 1 tablet (5 mg total) by mouth daily. (Patient taking differently: Take 5 mg by mouth in the morning.), Disp: 90 tablet, Rfl: 3   aspirin EC 81 MG tablet, Take 81 mg by mouth daily. Swallow whole., Disp: , Rfl:    atenolol (TENORMIN) 25 MG tablet, Take 1 tablet (25 mg total) by mouth daily. (Patient taking differently: Take 12.5 mg by mouth 2 (two) times daily.), Disp: 90 tablet, Rfl: 3   Calcium Carb-Cholecalciferol (CALCIUM + VITAMIN D3 PO), Take 1 tablet by mouth 2 (two) times daily., Disp: , Rfl:    dexamethasone (DECADRON) 4 MG tablet, Take 2 tabs at the day before and 1 tab daily for 2 days after chemotherapy, Disp: 36 tablet, Rfl: 6   EPINEPHrine 0.3 mg/0.3 mL IJ SOAJ injection, Inject 0.3 mg into the muscle as needed for anaphylaxis., Disp: 2 each, Rfl: 2   levothyroxine (SYNTHROID) 100 MCG tablet, Take 1/2 tablet on Sundays and 1 tablet daily all other days (Patient taking differently: Take 100 mcg by mouth in the morning.), Disp: 90 tablet, Rfl: 3   lidocaine-prilocaine (  EMLA) cream, Apply to affected area once, Disp: 30 g, Rfl: 3   loratadine (CLARITIN) 10 MG tablet, Take 10 mg by mouth at bedtime., Disp: , Rfl:    losartan (COZAAR) 100 MG tablet, Take 100 mg by mouth daily., Disp: , Rfl:    Multiple Vitamin (MULTIVITAMIN) tablet, Take 1 tablet by mouth in the morning., Disp: , Rfl:    ondansetron (ZOFRAN) 8 MG tablet, Take 1 tablet (8 mg total) by mouth every 8 (eight) hours as needed for nausea or vomiting. Start on the third day after chemotherapy., Disp: 30 tablet, Rfl: 1   prochlorperazine (COMPAZINE) 10 MG tablet, Take 1 tablet (10 mg total) by mouth every 6 (six) hours as needed for nausea or vomiting., Disp: 30 tablet, Rfl: 1   simvastatin (ZOCOR) 20 MG tablet, Take 1 tablet (20 mg  total) by mouth at bedtime. Cancel rx for '10mg'$ ., Disp: 90 tablet, Rfl: 3   simvastatin (ZOCOR) 20 MG tablet, Take 20 mg by mouth daily., Disp: , Rfl:  Social History   Socioeconomic History   Marital status: Single    Spouse name: April Weber    Number of children: 1   Years of education: Not on file   Highest education level: Not on file  Occupational History   Occupation: Retired    Comment: worked at [employer] in [employer]  Tobacco Use   Smoking status: Former    Packs/day: 0.25    Types: Cigarettes    Quit date: 05/11/1967    Years since quitting: 55.0   Smokeless tobacco: Never  Vaping Use   Vaping Use: Never used  Substance and Sexual Activity   Alcohol use: No   Drug use: No   Sexual activity: Not Currently    Birth control/protection: Post-menopausal, Surgical    Comment: hyst  Other Topics Concern   Not on file  Social History Narrative   Lives alone - very close with her neighbors   Social Determinants of Health   Financial Resource Strain: Low Risk  (04/08/2022)   Overall Financial Resource Strain (CARDIA)    Difficulty of Paying Living Expenses: Not very hard  Food Insecurity: No Food Insecurity (03/31/2022)   Hunger Vital Sign    Worried About Running Out of Food in the Last Year: Never true    [place] in the Last Year: Never true  Transportation Needs: No Transportation Needs (04/08/2022)   PRAPARE - Hydrologist (Medical): No    Lack of Transportation (Non-Medical): No  Physical Activity: Sufficiently Active (08/26/2021)   Exercise Vital Sign    Days of Exercise per Week: 7 days    Minutes of Exercise per Session: 30 min  Stress: No Stress Concern Present (08/26/2021)   April Weber    Feeling of Stress : Not at all  Social Connections: Moderately Integrated (08/26/2021)   Social Connection and Isolation Panel [NHANES]     Frequency of Communication with Friends and Family: More than three times April week    Frequency of Social Gatherings with Friends and Family: More than three times April week    Attends Religious Services: More than 4 times per year    Active Member of [employer] or Organizations: Yes    Attends Archivist Meetings: More than 4 times per year    Marital Status: April Weber  Intimate Partner Violence: Not At Risk (03/31/2022)   Humiliation, Afraid, Rape,  and Kick questionnaire    Fear of Current or Ex-Partner: No    Emotionally Abused: No    Physically Abused: No    Sexually Abused: No   Family History  Problem Relation Age of Onset   Heart attack Mother    Alcohol abuse Mother    Lung cancer Mother        d. mid 50s   Breast cancer Maternal Aunt 62   Colon polyps Neg Hx    Colon cancer Neg Hx    Esophageal cancer Neg Hx    Stomach cancer Neg Hx    Rectal cancer Neg Hx     Objective: Office vital signs reviewed. BP 130/84   Pulse 85   Temp 98.8 F (37.1 C)   Ht '5\' 7"'$  (1.702 m)   Wt 155 lb 3.2 oz (70.4 kg)   SpO2 99%   BMI 24.31 kg/m   Physical Examination:  General: Awake, alert, nontoxic, No acute distress HEENT: No exophthalmos.  MMM Cardio: regular rate and rhythm, S1S2 heard, no murmurs appreciated Pulm: clear to auscultation bilaterally, no wheezes, rhonchi or rales; normal work of breathing on room air GI: soft, non-tender, well healed scar noted vertically along lower mid abdomen  Assessment/ Plan: 77 y.o. 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  Check fasting liver panel, LFTs.  Ok to stay off fosamax at this time.  Could consider prolia if not cost prohibitive.  Continue norvasc, atenolol.  Ok to stay off ARB since BP controlled and risks dehydration due to side effects of chemo.  Follow up in 3 m.  No orders of the defined types were placed in this encounter.  No orders of the defined types were placed in this encounter.    April Norlander, DO [place] [phone]

## 2022-06-02 ENCOUNTER — Encounter: Payer: Self-pay | Admitting: Hematology and Oncology

## 2022-06-02 ENCOUNTER — Other Ambulatory Visit: Payer: Self-pay

## 2022-06-02 LAB — LIPID PANEL
Chol/HDL Ratio: 3.7 ratio (ref 0.0–4.4)
Cholesterol, Total: 146 mg/dL (ref 100–199)
HDL: 40 mg/dL (ref >39)
LDL Chol Calc (NIH): 87 mg/dL (ref 0–99)
Triglycerides: 104 mg/dL (ref 0–149)
VLDL Cholesterol Cal: 19 mg/dL (ref 5–40)

## 2022-06-02 LAB — HEPATIC FUNCTION PANEL
ALT: 19 IU/L (ref 0–32)
AST: 21 IU/L (ref 0–40)
Alkaline Phosphatase: 101 IU/L (ref 44–121)
Bilirubin Total: 0.2 mg/dL (ref 0.0–1.2)
Bilirubin, Direct: 0.12 mg/dL (ref 0.00–0.40)
Total Protein: 6 g/dL (ref 6.0–8.5)

## 2022-06-02 LAB — RENAL FUNCTION PANEL
Albumin: 3.6 g/dL — ABNORMAL LOW (ref 3.8–4.8)
BUN/Creatinine Ratio: 8 — ABNORMAL LOW (ref 12–28)
BUN: 7 mg/dL — ABNORMAL LOW (ref 8–27)
CO2: 22 mmol/L (ref 20–29)
Calcium: 8.8 mg/dL (ref 8.7–10.3)
Chloride: 97 mmol/L (ref 96–106)
Creatinine, Ser: 0.9 mg/dL (ref 0.57–1.00)
Glucose: 93 mg/dL (ref 70–99)
Phosphorus: 3 mg/dL (ref 3.0–4.3)
Potassium: 4.2 mmol/L (ref 3.5–5.2)
Sodium: 134 mmol/L (ref 134–144)
eGFR: 66 mL/min/{1.73_m2} (ref >59)

## 2022-06-02 LAB — CBC
Hematocrit: 30.8 % — ABNORMAL LOW (ref 34.0–46.6)
Hemoglobin: 10.6 g/dL — ABNORMAL LOW (ref 11.1–15.9)
MCH: 30.7 pg (ref 26.6–33.0)
MCHC: 34.4 g/dL (ref 31.5–35.7)
MCV: 89 fL (ref 79–97)
Platelets: 329 10*3/uL (ref 150–450)
RBC: 3.45 x10E6/uL — ABNORMAL LOW (ref 3.77–5.28)
RDW: 12.3 % (ref 11.7–15.4)
WBC: 9.2 10*3/uL (ref 3.4–10.8)

## 2022-06-02 LAB — VITAMIN D 25 HYDROXY (VIT D DEFICIENCY, FRACTURES): Vit D, 25-Hydroxy: 50.6 ng/mL (ref 30.0–100.0)

## 2022-06-02 LAB — TSH: TSH: 2.44 u[IU]/mL (ref 0.450–4.500)

## 2022-06-02 LAB — T4, FREE: Free T4: 1.67 ng/dL (ref 0.82–1.77)

## 2022-06-04 ENCOUNTER — Other Ambulatory Visit: Payer: Self-pay | Admitting: Hematology and Oncology

## 2022-06-04 DIAGNOSIS — C561 Malignant neoplasm of right ovary: Secondary | ICD-10-CM

## 2022-06-04 MED FILL — Dexamethasone Sodium Phosphate Inj 100 MG/10ML: INTRAMUSCULAR | Qty: 1 | Status: AC

## 2022-06-07 ENCOUNTER — Inpatient Hospital Stay: Payer: Medicare Other

## 2022-06-07 ENCOUNTER — Inpatient Hospital Stay (HOSPITAL_BASED_OUTPATIENT_CLINIC_OR_DEPARTMENT_OTHER): Payer: Medicare Other | Admitting: Hematology and Oncology

## 2022-06-07 ENCOUNTER — Other Ambulatory Visit: Payer: Self-pay

## 2022-06-07 ENCOUNTER — Encounter: Payer: Self-pay | Admitting: Hematology and Oncology

## 2022-06-07 VITALS — BP 143/71 | HR 94 | Temp 97.7°F | Resp 18 | Ht 67.0 in | Wt 154.4 lb

## 2022-06-07 DIAGNOSIS — C561 Malignant neoplasm of right ovary: Secondary | ICD-10-CM

## 2022-06-07 DIAGNOSIS — E860 Dehydration: Secondary | ICD-10-CM | POA: Diagnosis not present

## 2022-06-07 DIAGNOSIS — R634 Abnormal weight loss: Secondary | ICD-10-CM | POA: Diagnosis not present

## 2022-06-07 DIAGNOSIS — Z5111 Encounter for antineoplastic chemotherapy: Secondary | ICD-10-CM | POA: Diagnosis not present

## 2022-06-07 DIAGNOSIS — D6481 Anemia due to antineoplastic chemotherapy: Secondary | ICD-10-CM | POA: Diagnosis not present

## 2022-06-07 DIAGNOSIS — R110 Nausea: Secondary | ICD-10-CM | POA: Diagnosis not present

## 2022-06-07 DIAGNOSIS — T451X5A Adverse effect of antineoplastic and immunosuppressive drugs, initial encounter: Secondary | ICD-10-CM | POA: Diagnosis not present

## 2022-06-07 DIAGNOSIS — Z95828 Presence of other vascular implants and grafts: Secondary | ICD-10-CM

## 2022-06-07 DIAGNOSIS — R531 Weakness: Secondary | ICD-10-CM | POA: Diagnosis not present

## 2022-06-07 LAB — CMP (CANCER CENTER ONLY)
ALT: 16 U/L (ref 0–44)
AST: 15 U/L (ref 15–41)
Albumin: 3.7 g/dL (ref 3.5–5.0)
Alkaline Phosphatase: 71 U/L (ref 38–126)
Anion gap: 8 (ref 5–15)
BUN: 27 mg/dL — ABNORMAL HIGH (ref 8–23)
CO2: 24 mmol/L (ref 22–32)
Calcium: 9.1 mg/dL (ref 8.9–10.3)
Chloride: 103 mmol/L (ref 98–111)
Creatinine: 0.98 mg/dL (ref 0.44–1.00)
GFR, Estimated: 59 mL/min — ABNORMAL LOW (ref >60)
Glucose, Bld: 149 mg/dL — ABNORMAL HIGH (ref 70–99)
Potassium: 4 mmol/L (ref 3.5–5.1)
Sodium: 135 mmol/L (ref 135–145)
Total Bilirubin: 0.3 mg/dL (ref 0.3–1.2)
Total Protein: 7.1 g/dL (ref 6.5–8.1)

## 2022-06-07 LAB — CBC WITH DIFFERENTIAL (CANCER CENTER ONLY)
Abs Immature Granulocytes: 0.04 10*3/uL (ref 0.00–0.07)
Basophils Absolute: 0 10*3/uL (ref 0.0–0.1)
Basophils Relative: 0 %
Eosinophils Absolute: 0.1 10*3/uL (ref 0.0–0.5)
Eosinophils Relative: 1 %
HCT: 29.9 % — ABNORMAL LOW (ref 36.0–46.0)
Hemoglobin: 10.6 g/dL — ABNORMAL LOW (ref 12.0–15.0)
Immature Granulocytes: 1 %
Lymphocytes Relative: 6 %
Lymphs Abs: 0.5 10*3/uL — ABNORMAL LOW (ref 0.7–4.0)
MCH: 31.4 pg (ref 26.0–34.0)
MCHC: 35.5 g/dL (ref 30.0–36.0)
MCV: 88.5 fL (ref 80.0–100.0)
Monocytes Absolute: 0.1 10*3/uL (ref 0.1–1.0)
Monocytes Relative: 2 %
Neutro Abs: 7.2 10*3/uL (ref 1.7–7.7)
Neutrophils Relative %: 90 %
Platelet Count: 273 10*3/uL (ref 150–400)
RBC: 3.38 MIL/uL — ABNORMAL LOW (ref 3.87–5.11)
RDW: 13.1 % (ref 11.5–15.5)
WBC Count: 7.9 10*3/uL (ref 4.0–10.5)
nRBC: 0 % (ref 0.0–0.2)

## 2022-06-07 MED ORDER — SODIUM CHLORIDE 0.9% FLUSH
10.0000 mL | Freq: Once | INTRAVENOUS | Status: AC
  Administered 2022-06-07: 10 mL

## 2022-06-07 MED ORDER — SODIUM CHLORIDE 0.9 % IV SOLN
75.0000 mg/m2 | Freq: Once | INTRAVENOUS | Status: AC
  Administered 2022-06-07: 140 mg via INTRAVENOUS
  Filled 2022-06-07: qty 14

## 2022-06-07 MED ORDER — SODIUM CHLORIDE 0.9 % IV SOLN
10.0000 mg | Freq: Once | INTRAVENOUS | Status: AC
  Administered 2022-06-07: 10 mg via INTRAVENOUS
  Filled 2022-06-07: qty 10

## 2022-06-07 MED ORDER — SODIUM CHLORIDE 0.9 % IV SOLN
471.0000 mg | Freq: Once | INTRAVENOUS | Status: AC
  Administered 2022-06-07: 450 mg via INTRAVENOUS
  Filled 2022-06-07: qty 45

## 2022-06-07 MED ORDER — HEPARIN SOD (PORK) LOCK FLUSH 100 UNIT/ML IV SOLN
500.0000 [IU] | Freq: Once | INTRAVENOUS | Status: AC | PRN
  Administered 2022-06-07: 500 [IU]

## 2022-06-07 MED ORDER — SODIUM CHLORIDE 0.9 % IV SOLN: Freq: Once | INTRAVENOUS | Status: AC

## 2022-06-07 MED ORDER — FAMOTIDINE IN NACL 20-0.9 MG/50ML-% IV SOLN
20.0000 mg | Freq: Once | INTRAVENOUS | Status: AC
  Administered 2022-06-07: 20 mg via INTRAVENOUS
  Filled 2022-06-07: qty 50

## 2022-06-07 MED ORDER — CETIRIZINE HCL 10 MG/ML IV SOLN
10.0000 mg | Freq: Once | INTRAVENOUS | Status: AC
  Administered 2022-06-07: 10 mg via INTRAVENOUS
  Filled 2022-06-07: qty 1

## 2022-06-07 MED ORDER — SODIUM CHLORIDE 0.9% FLUSH
10.0000 mL | INTRAVENOUS | Status: DC | PRN
  Administered 2022-06-07: 10 mL

## 2022-06-07 MED ORDER — PALONOSETRON HCL INJECTION 0.25 MG/5ML
0.2500 mg | Freq: Once | INTRAVENOUS | Status: AC
  Administered 2022-06-07: 0.25 mg via INTRAVENOUS
  Filled 2022-06-07: qty 5

## 2022-06-07 NOTE — Progress Notes (Signed)
[place] OFFICE PROGRESS NOTE  Patient Care Team: [name], DO as PCP - General (Family Medicine) Vania Rea, MD as Attending Physician (Obstetrics and Gynecology) Linus Mako, MD as Consulting Physician (Family Medicine) Celestia Khat, OD (Optometry)  ASSESSMENT & PLAN:  Right ovarian epithelial cancer [facility]) She had minor reaction to Emend Her antiemetics was changed She denies nausea or constipation with side effects of treatment In fact, she tends to have loose stool We will proceed with treatment as scheduled  Anemia due to antineoplastic chemotherapy This is likely due to recent treatment. The patient denies recent history of bleeding such as epistaxis, hematuria or hematochezia. She is asymptomatic from the anemia. I will observe for now.  She does not require transfusion now. I will continue the chemotherapy at current dose without dosage adjustment.  If the anemia gets progressive worse in the future, I might have to delay her treatment or adjust the chemotherapy dose.   No orders of the defined types were placed in this encounter.   All questions were answered. The patient knows to call the clinic with any problems, questions or concerns. The total time spent in the appointment was 25 minutes encounter with patients including review of chart and various tests results, discussions about plan of care and coordination of care plan   [name], MD 06/07/2022 12:38 PM  INTERVAL HISTORY: Please see below for problem oriented charting. she returns for treatment follow-up seen prior to chemotherapy She had mild reaction to Emend last cycle but that has no long-term consequences Denies peripheral neuropathy She had intermittent loose stool  REVIEW OF SYSTEMS:   Constitutional: Denies fevers, chills or abnormal weight loss Eyes: Denies blurriness of vision Ears, nose, mouth, throat, and face: Denies mucositis or sore throat Respiratory:  Denies cough, dyspnea or wheezes Cardiovascular: Denies palpitation, chest discomfort or lower extremity swelling Skin: Denies abnormal skin rashes Lymphatics: Denies new lymphadenopathy or easy bruising Neurological:Denies numbness, tingling or new weaknesses Behavioral/Psych: Mood is stable, no new changes  All other systems were reviewed with the patient and are negative.  I have reviewed the past medical history, past surgical history, social history and family history with the patient and they are unchanged from previous note.  ALLERGIES:  is allergic to paclitaxel, emend [fosaprepitant dimeglumine], codeine, nsaids, penicillins, vibra-tab [doxycycline], iodine, and latex.  MEDICATIONS:  Current Outpatient Medications  Medication Sig Dispense Refill   acetaminophen (TYLENOL) 500 MG tablet Take 500-1,000 mg by mouth daily as needed for moderate pain, fever or headache.     amLODipine (NORVASC) 5 MG tablet Take 1 tablet (5 mg total) by mouth in the morning. 90 tablet 3   aspirin EC 81 MG tablet Take 81 mg by mouth daily. Swallow whole.     atenolol (TENORMIN) 25 MG tablet Take 0.5 tablets (12.5 mg total) by mouth 2 (two) times daily. 90 tablet 3   Calcium Carb-Cholecalciferol (CALCIUM + VITAMIN D3 PO) Take 1 tablet by mouth 2 (two) times daily.     dexamethasone (DECADRON) 4 MG tablet Take 2 tabs at the day before and 1 tab daily for 2 days after chemotherapy 36 tablet 6   EPINEPHrine 0.3 mg/0.3 mL IJ SOAJ injection Inject 0.3 mg into the muscle as needed for anaphylaxis. 2 each 2   levothyroxine (SYNTHROID) 100 MCG tablet Take 1 tablet (100 mcg total) by mouth in the morning. 90 tablet 3   lidocaine-prilocaine (EMLA) cream Apply to affected area once 30 g 3  loratadine (CLARITIN) 10 MG tablet Take 10 mg by mouth at bedtime.     Multiple Vitamin (MULTIVITAMIN) tablet Take 1 tablet by mouth in the morning.     ondansetron (ZOFRAN) 8 MG tablet Take 1 tablet (8 mg total) by mouth every 8  (eight) hours as needed for nausea or vomiting. Start on the third day after chemotherapy. 30 tablet 1   prochlorperazine (COMPAZINE) 10 MG tablet Take 1 tablet (10 mg total) by mouth every 6 (six) hours as needed for nausea or vomiting. 30 tablet 1   simvastatin (ZOCOR) 20 MG tablet Take 1 tablet (20 mg total) by mouth at bedtime. 90 tablet 3   No current facility-administered medications for this visit.   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 Large, mixed solid and cystic mass in the low pelvis, difficult to clearly delineate from adjacent ascites although at least 10.2 x 10.0 cm. This is highly concerning for primary ovarian malignancy and most likely arises from the right ovary, although may involve both ovaries. This may be further evaluated by contrast enhanced MRI of the pelvis if desired. 2. Large volume ascites throughout the abdomen and pelvis, presumed malignant. Stranding and nodularity of the omentum and peritoneum, particularly in the ventral abdomen and left upper quadrant, highly suspicious for peritoneal metastatic disease. 3. Cirrhotic morphology of the liver, which may contribute to ascites. 4. Severely atrophic left kidney, consistent with remote prior infectious, obstructive, or ischemic insult. 5. Coronary artery disease.   Aortic Atherosclerosis (ICD10-I70.0).   03/17/2022 Imaging   1. There is a large solid and cystic mass within the right adnexa which measures up to 9.5 cm in maximum dimension. Findings are concerning for a primary ovarian neoplasm. 2. Large  volume of ascites within the abdomen and pelvis. Given the presence of peritoneal nodules on the CT from the prior day findings are concerning for malignant ascites.   03/19/2022 Procedure   CT-guided biopsy of omental mass.    03/19/2022 Procedure   Successful ultrasound-guided paracentesis yielding 4.2 liters of peritoneal fluid.   03/19/2022 Procedure   Successful placement of a left internal jugular approach power injectable Port-A-Cath. The catheter is ready for immediate use.   03/30/2022 Initial Diagnosis   Ovarian cancer ([place])   03/30/2022 Pathology Results   A. RIGHT OVARY AND FALLOPIAN TUBE, SALPINGO OOPHORECTOMY: Poorly differentiated carcinoma immunohistochemically most consistent with a high grade serous carcinoma Tumor measures 10.7 x 10.6 x 4.6 cm Tumor involves ovarian and mesosalpinx serosal surfaces and right fallopian tube pT2a pN n/a  Note: The tumor consists of sheets and cords of high-grade nuclei with areas of necrosis and adjacent prominent stroma suggesting papillary cores the overall morphology is slightly unusual and not classic; therefore, a battery of immunohistochemical stains were performed to confirm the diagnosis and exclude a high-grade germ cell tumor.  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 Additionally there is nonspecific focal PLAP positivity; CD117 positivity and D2-40 positivity of uncertain clinical significance.  The  tumor is negative for AFP, hCG, CD30, CD31, CK20, glypican-3, and OCT 3/4.  Dr. Saralyn Pilar has peer reviewed the case and agrees with diagnosis. Dr. Alric Seton has also initiated the workup of the case and agrees with the interpretation.  B. OMENTUM, RESECTION: Benign omentum Negative for tumor  C. RIGHT PELVIC SIDEWALL, BIOPSY: Benign mesothelium, fibrovascular stroma and adipose Negative for tumor  D. ANTERIOR CUL DE SAC,  BIOPSY: Benign mesothelium Negative for tumor  E. LEFT PELVIC SIDEWALL, BIOPSY: Benign mesothelium and adipose Negative for tumor   ONCOLOGY TABLE:  OVARY or FALLOPIAN TUBE or PRIMARY PERITONEUM: Resection  Procedure: Right salpingo-oophorectomy Specimen Integrity: Grossly focally disrupted Tumor Site: Right ovary Tumor Size: 10.7 x 10.6 x 4.6 cm Histologic Type: Poorly differentiated carcinoma most consistent with a high-grade serous carcinoma Histologic Grade: High-grade Ovarian Surface Involvement: Surface involvement Fallopian Tube Surface Involvement: Mesosalpinx serosal surface involvement and right fallopian tube involvement Implants (required for advanced stage serous/seromucinous borderline tumors only): Not identified Other Tissue/ Organ Involvement: N/A Largest Extrapelvic Peritoneal Focus: N/A Peritoneal/Ascitic Fluid Involvement: N/A Chemotherapy Response Score (CRS): N/A, no known presurgical therapy Regional Lymph Nodes: N/A, no lymph nodes submitted Distant Metastasis:      Distant Site(s) Involved: N/A Pathologic Stage Classification (pTNM, AJCC 8th Edition): pT2a, pN N/A Ancillary Studies: Can be performed on physician request Representative Tumor Block: A8 Comment(s): [None]    03/30/2022 Surgery   Preop Diagnosis: Pelvic mass, malignant ascites   Postoperative Diagnosis: at least stage IIB ovarian cancer, suspected high grade serous by frozen section   Surgery: Diagnostic laparoscopy, right salpingo-oophorectomy, peritoneal biopsies, infra-colic omentectomy    Surgeon:  Valarie Cones MD    Operative findings: On exam under anesthesia, somewhat mobile mass appreciated in the cul-de-sac to the right.  On agnostic laparoscopy, normal upper abdominal findings other than moderate volume ascites.  Normal-appearing peritoneum, omentum, small bowel.  Right adnexa enlarged with a mass measuring approximately 8-10 cm.  No carcinomatosis appreciated.  Given this,  decision made to convert to an open procedure for tumor debulking.  Approximately 5 L of yellow-tinged ascites was removed upon intra-abdominal entry.  Normal upper abdominal survey including smooth diaphragm and liver, stomach, and omentum.  The small bowel was run from the cecum to the ligament of Treitz with no abnormalities or carcinomatosis noted.  Right ovary replaced by an approximately 8 cm smooth mass, dilated and enlarged fallopian tube.  The mass itself was adherent to the right pelvic sidewall, sigmoid colon, and densely adherent to the anterior cul-de-sac/bladder peritoneum.  Frozen section consistent with invasive carcinoma, likely high-grade.  Left tube and ovary were surgically absent.  Sigmoid colon with adhesions to the left pelvic sidewall, left aspect of the bladder and the left vaginal cuff.  Somewhat redundant sigmoid which looped over on itself secondary to these adhesions.  Numerous diverticula noted of the sigmoid colon as well as the transverse colon.  The end of surgery, an R0 resection was accomplished.     04/13/2022 Cancer Staging   Staging form: Ovary, Fallopian Tube, and Primary Peritoneal Carcinoma, AJCC 8th Edition - Pathologic stage from 04/13/2022: Stage II (pT2, pN0, cM0) - Signed by [name], MD on 04/13/2022 Stage prefix: Initial diagnosis   04/26/2022 -  Chemotherapy   Patient is on Treatment Plan : OVARIAN Carboplatin (AUC 6) + Paclitaxel (175) q21d X 6 Cycles     05/17/2022 Tumor Marker   Patient's tumor was tested for the following markers: CA-125. Results of the tumor marker test revealed  12.7.     PHYSICAL EXAMINATION: ECOG PERFORMANCE STATUS: 1 - Symptomatic but completely ambulatory  Vitals:   06/07/22 0849  BP: (!) 143/71  Pulse: 94  Resp: 18  Temp: 97.7 F (36.5 C)  SpO2: 100%   Filed Weights   06/07/22 0849  Weight: 154 lb 6.4 oz (70 kg)    GENERAL:alert, no distress and comfortable NEURO: alert & oriented x 3 with fluent speech, no  focal motor/sensory deficits  LABORATORY DATA:  I have reviewed the data as listed    Component Value Date/Time   NA 135 06/07/2022 0824   NA 134 06/01/2022 0848   K 4.0 06/07/2022 0824   CL 103 06/07/2022 0824   CO2 24 06/07/2022 0824   GLUCOSE 149 (H) 06/07/2022 0824   BUN 27 (H) 06/07/2022 0824   BUN 7 (L) 06/01/2022 0848   CREATININE 0.98 06/07/2022 0824   CREATININE 0.99 02/05/2013 0757   CALCIUM 9.1 06/07/2022 0824   PROT 7.1 06/07/2022 0824   PROT 6.0 06/01/2022 0848   ALBUMIN 3.7 06/07/2022 0824   ALBUMIN 3.6 (L) 06/01/2022 0848   AST 15 06/07/2022 0824   ALT 16 06/07/2022 0824   ALKPHOS 71 06/07/2022 0824   BILITOT 0.3 06/07/2022 0824   GFRNONAA 59 (L) 06/07/2022 0824   GFRNONAA 59 (L) 02/05/2013 0757   GFRAA 61 05/20/2020 0850   GFRAA 68 02/05/2013 0757    No results found for: "SPEP", "UPEP"  Lab Results  Component Value Date   WBC 7.9 06/07/2022   NEUTROABS 7.2 06/07/2022   HGB 10.6 (L) 06/07/2022   HCT 29.9 (L) 06/07/2022   MCV 88.5 06/07/2022   PLT 273 06/07/2022      Chemistry      Component Value Date/Time   NA 135 06/07/2022 0824   NA 134 06/01/2022 0848   K 4.0 06/07/2022 0824   CL 103 06/07/2022 0824   CO2 24 06/07/2022 0824   BUN 27 (H) 06/07/2022 0824   BUN 7 (L) 06/01/2022 0848   CREATININE 0.98 06/07/2022 0824   CREATININE 0.99 02/05/2013 0757      Component Value Date/Time   CALCIUM 9.1 06/07/2022 0824   ALKPHOS 71 06/07/2022 0824   AST 15 06/07/2022 0824   ALT 16 06/07/2022 0824   BILITOT 0.3 06/07/2022 0824

## 2022-06-07 NOTE — Patient Instructions (Signed)
[facility]  Discharge Instructions: Thank you for choosing [facility] to provide your oncology and hematology care.   If you have a lab appointment with the [facility], please go directly to the [facility] and check in at the registration area.   Wear comfortable clothing and clothing appropriate for easy access to any Portacath or PICC line.   We strive to give you quality time with your provider. You may need to reschedule your appointment if you arrive late (15 or more minutes).  Arriving late affects you and other patients whose appointments are after yours.  Also, if you miss three or more appointments without notifying the office, you may be dismissed from the clinic at the provider's discretion.      For prescription refill requests, have your pharmacy contact our office and allow 72 hours for refills to be completed.    Today you received the following chemotherapy and/or immunotherapy agents Docetaxel and Carboplatin      To help prevent nausea and vomiting after your treatment, we encourage you to take your nausea medication as directed.  BELOW ARE SYMPTOMS THAT SHOULD BE REPORTED IMMEDIATELY: *FEVER GREATER THAN 100.4 F (38 C) OR HIGHER *CHILLS OR SWEATING *NAUSEA AND VOMITING THAT IS NOT CONTROLLED WITH YOUR NAUSEA MEDICATION *UNUSUAL SHORTNESS OF BREATH *UNUSUAL BRUISING OR BLEEDING *URINARY PROBLEMS (pain or burning when urinating, or frequent urination) *BOWEL PROBLEMS (unusual diarrhea, constipation, pain near the anus) TENDERNESS IN MOUTH AND THROAT WITH OR WITHOUT PRESENCE OF ULCERS (sore throat, sores in mouth, or a toothache) UNUSUAL RASH, SWELLING OR PAIN  UNUSUAL VAGINAL DISCHARGE OR ITCHING   Items with * indicate a potential emergency and should be followed up as soon as possible or go to the Emergency Department if any problems should occur.  Please show the CHEMOTHERAPY ALERT CARD or IMMUNOTHERAPY  ALERT CARD at check-in to the Emergency Department and triage nurse.  Should you have questions after your visit or need to cancel or reschedule your appointment, please contact [facility]  Dept: [phone]  and follow the prompts.  Office hours are 8:00 a.m. to 4:30 p.m. Monday - Friday. Please note that voicemails left after 4:00 p.m. may not be returned until the following business day.  We are closed weekends and major holidays. You have access to a nurse at all times for urgent questions. Please call the main number to the clinic Dept: [phone] and follow the prompts.   For any non-urgent questions, you may also contact your provider using MyChart. We now offer e-Visits for anyone 84 and older to request care online for non-urgent symptoms. For details visit [email or web address].   Also download the MyChart app! Go to the app store, search "MyChart", open the app, select [facility], and log in with your MyChart username and password.

## 2022-06-07 NOTE — Assessment & Plan Note (Signed)
She had minor reaction to Emend Her antiemetics was changed She denies nausea or constipation with side effects of treatment In fact, she tends to have loose stool We will proceed with treatment as scheduled

## 2022-06-07 NOTE — Assessment & Plan Note (Signed)

## 2022-06-08 ENCOUNTER — Telehealth: Payer: Self-pay | Admitting: Genetic Counselor

## 2022-06-08 NOTE — Telephone Encounter (Signed)
Revealed negative genetics.  HRD testing is pending.

## 2022-06-09 ENCOUNTER — Ambulatory Visit: Payer: Self-pay | Admitting: Genetic Counselor

## 2022-06-09 ENCOUNTER — Encounter: Payer: Self-pay | Admitting: Genetic Counselor

## 2022-06-09 DIAGNOSIS — Z1379 Encounter for other screening for genetic and chromosomal anomalies: Secondary | ICD-10-CM

## 2022-06-09 DIAGNOSIS — Z853 Personal history of malignant neoplasm of breast: Secondary | ICD-10-CM

## 2022-06-09 DIAGNOSIS — Z803 Family history of malignant neoplasm of breast: Secondary | ICD-10-CM

## 2022-06-09 DIAGNOSIS — C561 Malignant neoplasm of right ovary: Secondary | ICD-10-CM

## 2022-06-09 LAB — CA 125: Cancer Antigen (CA) 125: 16.5 U/mL (ref 0.0–38.1)

## 2022-06-09 NOTE — Progress Notes (Signed)
HPI:   [name] was previously seen in the [place] clinic due to a family history of breast and ovarian cancer and concerns regarding a hereditary predisposition to cancer. Please refer to our prior cancer genetics clinic note for more information regarding our discussion, assessment and recommendations, at the time. April Weber recent genetic test results were disclosed to her, as were recommendations warranted by these results. These results and recommendations are discussed in more detail below.  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  She has a history of right breast cancer in her 34s s/p right mastectomy.   Oncology History Overview Note  High grade papillary serous   Right ovarian epithelial cancer ([place])  03/16/2022 Imaging   1. Large, mixed solid and cystic mass in the low pelvis, difficult to clearly delineate from adjacent ascites although at least 10.2 x 10.0 cm. This is highly concerning for primary ovarian malignancy and most likely arises from the right ovary, although may involve both ovaries. This may be further evaluated by contrast enhanced MRI of the pelvis if desired. 2. Large volume ascites throughout the abdomen and pelvis, presumed malignant. Stranding and nodularity of the omentum and peritoneum, particularly in the ventral abdomen and left upper quadrant, highly suspicious for peritoneal metastatic disease. 3. Cirrhotic morphology of the liver, which may contribute to ascites. 4. Severely atrophic left kidney, consistent with remote prior infectious, obstructive, or ischemic insult. 5. Coronary artery disease.   Aortic Atherosclerosis (ICD10-I70.0).   03/17/2022 Imaging   1. There is a large solid and cystic mass within the right adnexa which measures up to 9.5 cm in maximum dimension. Findings are concerning for a primary ovarian neoplasm. 2. Large volume of ascites within the  abdomen and pelvis. Given the presence of peritoneal nodules on the CT from the prior day findings are concerning for malignant ascites.   03/19/2022 Procedure   CT-guided biopsy of omental mass.    03/19/2022 Procedure   Successful ultrasound-guided paracentesis yielding 4.2 liters of peritoneal fluid.   03/19/2022 Procedure   Successful placement of a left internal jugular approach power injectable Port-A-Cath. The catheter is ready for immediate use.   03/30/2022 Initial Diagnosis   Ovarian cancer ([place])   03/30/2022 Pathology Results   A. RIGHT OVARY AND FALLOPIAN TUBE, SALPINGO OOPHORECTOMY: Poorly differentiated carcinoma immunohistochemically most consistent with a high grade serous carcinoma Tumor measures 10.7 x 10.6 x 4.6 cm Tumor involves ovarian and mesosalpinx serosal surfaces and right fallopian tube pT2a pN n/a  Note: The tumor consists of sheets and cords of high-grade nuclei with areas of necrosis and adjacent prominent stroma suggesting papillary cores the overall morphology is slightly unusual and not classic; therefore, a battery of immunohistochemical stains were performed to confirm the diagnosis and exclude a high-grade germ cell tumor.  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 Additionally there is nonspecific focal PLAP positivity; CD117 positivity and D2-40 positivity of uncertain clinical significance.  The tumor is negative for AFP, hCG, CD30, CD31, CK20, glypican-3, and OCT 3/4.  Dr. Saralyn Pilar has peer reviewed the case and agrees with diagnosis. Dr. Alric Seton has also initiated the workup of the case and agrees with the interpretation.  B. OMENTUM, RESECTION: Benign omentum Negative for tumor  C. RIGHT PELVIC SIDEWALL, BIOPSY: Benign mesothelium, fibrovascular stroma and adipose Negative  for tumor  D. ANTERIOR CUL DE SAC, BIOPSY: Benign  mesothelium Negative for tumor  E. LEFT PELVIC SIDEWALL, BIOPSY: Benign mesothelium and adipose Negative for tumor   ONCOLOGY TABLE:  OVARY or FALLOPIAN TUBE or PRIMARY PERITONEUM: Resection  Procedure: Right salpingo-oophorectomy Specimen Integrity: Grossly focally disrupted Tumor Site: Right ovary Tumor Size: 10.7 x 10.6 x 4.6 cm Histologic Type: Poorly differentiated carcinoma most consistent with a high-grade serous carcinoma Histologic Grade: High-grade Ovarian Surface Involvement: Surface involvement Fallopian Tube Surface Involvement: Mesosalpinx serosal surface involvement and right fallopian tube involvement Implants (required for advanced stage serous/seromucinous borderline tumors only): Not identified Other Tissue/ Organ Involvement: N/A Largest Extrapelvic Peritoneal Focus: N/A Peritoneal/Ascitic Fluid Involvement: N/A Chemotherapy Response Score (CRS): N/A, no known presurgical therapy Regional Lymph Nodes: N/A, no lymph nodes submitted Distant Metastasis:      Distant Site(s) Involved: N/A Pathologic Stage Classification (pTNM, AJCC 8th Edition): pT2a, pN N/A Ancillary Studies: Can be performed on physician request Representative Tumor Block: A8 Comment(s): [None]    03/30/2022 Surgery   Preop Diagnosis: Pelvic mass, malignant ascites   Postoperative Diagnosis: at least stage IIB ovarian cancer, suspected high grade serous by frozen section   Surgery: Diagnostic laparoscopy, right salpingo-oophorectomy, peritoneal biopsies, infra-colic omentectomy    Surgeon:  Valarie Cones MD    Operative findings: On exam under anesthesia, somewhat mobile mass appreciated in the cul-de-sac to the right.  On agnostic laparoscopy, normal upper abdominal findings other than moderate volume ascites.  Normal-appearing peritoneum, omentum, small bowel.  Right adnexa enlarged with a mass measuring approximately 8-10 cm.  No carcinomatosis appreciated.  Given this, decision made to  convert to an open procedure for tumor debulking.  Approximately 5 L of yellow-tinged ascites was removed upon intra-abdominal entry.  Normal upper abdominal survey including smooth diaphragm and liver, stomach, and omentum.  The small bowel was run from the cecum to the ligament of Treitz with no abnormalities or carcinomatosis noted.  Right ovary replaced by an approximately 8 cm smooth mass, dilated and enlarged fallopian tube.  The mass itself was adherent to the right pelvic sidewall, sigmoid colon, and densely adherent to the anterior cul-de-sac/bladder peritoneum.  Frozen section consistent with invasive carcinoma, likely high-grade.  Left tube and ovary were surgically absent.  Sigmoid colon with adhesions to the left pelvic sidewall, left aspect of the bladder and the left vaginal cuff.  Somewhat redundant sigmoid which looped over on itself secondary to these adhesions.  Numerous diverticula noted of the sigmoid colon as well as the transverse colon.  The end of surgery, an R0 resection was accomplished.     04/13/2022 Cancer Staging   Staging form: Ovary, Fallopian Tube, and Primary Peritoneal Carcinoma, AJCC 8th Edition - Pathologic stage from 04/13/2022: Stage II (pT2, pN0, cM0) - Signed by [name], MD on 04/13/2022 Stage prefix: Initial diagnosis   04/26/2022 -  Chemotherapy   Patient is on Treatment Plan : OVARIAN Carboplatin (AUC 6) + Paclitaxel (175) q21d X 6 Cycles     05/17/2022 Tumor Marker   Patient's tumor was tested for the following markers: CA-125. Results of the tumor marker test revealed 12.7.   06/06/2022 Genetic Testing   Negative [facility] Multi-Cancer +RNA Panel.  Report date is 06/06/2022.   The Multi-Cancer + RNA Panel offered by [facility] includes sequencing and/or deletion/duplication analysis of the following 70 genes:  AIP*, ALK, APC*, ATM*, AXIN2*, BAP1*, BARD1*, BLM*, BMPR1A*, BRCA1*, BRCA2*, BRIP1*, CDC73*, CDH1*, CDK4, CDKN1B*, CDKN2A, CHEK2*, CTNNA1*, DICER1*,  EPCAM (del/dup  only), EGFR, FH*, FLCN*, GREM1 (promoter dup only), HOXB13, KIT, LZTR1, MAX*, MBD4, MEN1*, MET, MITF, MLH1*, MSH2*, MSH3*, MSH6*, MUTYH*, NF1*, NF2*, NTHL1*, PALB2*, PDGFRA, PMS2*, POLD1*, POLE*, POT1*, PRKAR1A*, PTCH1*, PTEN*, RAD51C*, RAD51D*, RB1*, RET, SDHA* (sequencing only), SDHAF2*, SDHB*, SDHC*, SDHD*, SMAD4*, SMARCA4*, SMARCB1*, SMARCE1*, STK11*, SUFU*, TMEM127*, TP53*, TSC1*, TSC2*, VHL*. RNA analysis is performed for * genes.     FAMILY HISTORY:  We obtained a detailed, 4-generation family history.  Significant diagnoses are listed below:      Family History  Problem Relation Age of Onset   Lung cancer Mother          d. mid 82s   Breast cancer Maternal Aunt 36       April Weber reported that her daughter, April Weber, may have had negative BRCA1/2 genetic testing previously.  No report was available for review today.   April Weber reported limited information about extended relatives (aunts, uncles, cousins, etc).   There is no reported Ashkenazi Jewish ancestry. There is no known consanguinity.  GENETIC TEST RESULTS:  The [facility] Multi-Cancer +RNA Panel found no pathogenic mutations.   The Multi-Cancer + RNA Panel offered by [facility] includes sequencing and/or deletion/duplication analysis of the following 70 genes:  AIP*, ALK, APC*, ATM*, AXIN2*, BAP1*, BARD1*, BLM*, BMPR1A*, BRCA1*, BRCA2*, BRIP1*, CDC73*, CDH1*, CDK4, CDKN1B*, CDKN2A, CHEK2*, CTNNA1*, DICER1*, EPCAM (del/dup only), EGFR, FH*, FLCN*, GREM1 (promoter dup only), HOXB13, KIT, LZTR1, MAX*, MBD4, MEN1*, MET, MITF, MLH1*, MSH2*, MSH3*, MSH6*, MUTYH*, NF1*, NF2*, NTHL1*, PALB2*, PDGFRA, PMS2*, POLD1*, POLE*, POT1*, PRKAR1A*, PTCH1*, PTEN*, RAD51C*, RAD51D*, RB1*, RET, SDHA* (sequencing only), SDHAF2*, SDHB*, SDHC*, SDHD*, SMAD4*, SMARCA4*, SMARCB1*, SMARCE1*, STK11*, SUFU*, TMEM127*, TP53*, TSC1*, TSC2*, VHL*. RNA analysis is performed for * genes. .   The test report has been scanned into EPIC and is located  under the Molecular Pathology section of the Results Review tab.  A portion of the result report is included below for reference. Genetic testing reported out on June 06, 2022.      Even though a pathogenic variant was not identified, possible explanations for the cancer in the family may include: There may be no hereditary risk for cancer in the family. The cancers in April Weber and/or her family may be sporadic/familial or due to other genetic and environmental factors. There may be a gene mutation in one of these genes that current testing methods cannot detect but that chance is small. There could be another gene that has not yet been discovered, or that we have not yet tested, that is responsible for the cancer diagnoses in the family.    Therefore, it is important to remain in touch with cancer genetics in the future so that we can continue to offer April Weber the most up to date genetic testing.    ADDITIONAL GENETIC TESTING:  We discussed with April Weber that her genetic testing was fairly extensive.  If there are additional relevant genes identified to increase cancer risk that can be analyzed in the future, we would be happy to discuss and coordinate this testing at that time.    Somatic HRD testing is pending.   CANCER SCREENING RECOMMENDATIONS:  April Weber test result is considered negative (normal).  This means that we have not identified a hereditary cause for her personal history of breast or ovarian at this time.   An individual's cancer risk and medical management are not determined by genetic test results alone. Overall cancer risk assessment incorporates additional factors, including personal medical history, family  history, and any available genetic information that may result in a personalized plan for cancer prevention and surveillance. Therefore, it is recommended she continue to follow the cancer management and screening guidelines provided by her oncology and primary  healthcare provider.   RECOMMENDATIONS FOR FAMILY MEMBERS:   Since she did not inherit a identifiable mutation in a cancer predisposition gene included on this panel, her daughter could not have inherited a known mutation from her in one of these genes. Individuals in this family might be at some increased risk of developing cancer, over the general population risk, due to the family history of cancer.  Individuals in the family should notify their providers of the family history of cancer. We recommend women in this family have a yearly mammogram beginning at age 77, or 46 years younger than the earliest onset of cancer, an annual clinical breast exam, and perform monthly breast self-exams.  April Weber asked about ovarian cancer screening for her daughter, and we briefly discussed the limitations of ovarian cancer screening.  We encouraged her to have her daughter speak with her gynecologist about specific screening recommendations.    FOLLOW-UP:  April Weber will be contacted with HRD testing results when available.   We discussed with April Weber that cancer genetics is a rapidly advancing field and it is possible that new genetic tests will be appropriate for her and/or her family members in the future. We encouraged her to remain in contact with cancer genetics on an annual basis so we can update her personal and family histories and let her know of advances in cancer genetics that may benefit this family.   Our contact number was provided. April Weber questions were answered to her satisfaction, and she knows she is welcome to call us at anytime with additional questions or concerns.   Afra Weber M. Joette Catching, [place], [facility] Genetic Counselor [email or web address] (P) [phone]

## 2022-06-13 ENCOUNTER — Inpatient Hospital Stay (HOSPITAL_BASED_OUTPATIENT_CLINIC_OR_DEPARTMENT_OTHER)
Admission: EM | Admit: 2022-06-13 | Discharge: 2022-06-17 | DRG: 809 | Disposition: A | Discharge status: Home / Self Care | Payer: Medicare Other | Attending: Internal Medicine | Admitting: Internal Medicine

## 2022-06-13 ENCOUNTER — Other Ambulatory Visit: Payer: Self-pay

## 2022-06-13 ENCOUNTER — Encounter (HOSPITAL_BASED_OUTPATIENT_CLINIC_OR_DEPARTMENT_OTHER): Payer: Self-pay

## 2022-06-13 ENCOUNTER — Emergency Department (HOSPITAL_BASED_OUTPATIENT_CLINIC_OR_DEPARTMENT_OTHER): Payer: Medicare Other

## 2022-06-13 DIAGNOSIS — Z9221 Personal history of antineoplastic chemotherapy: Secondary | ICD-10-CM

## 2022-06-13 DIAGNOSIS — E039 Hypothyroidism, unspecified: Secondary | ICD-10-CM | POA: Diagnosis present

## 2022-06-13 DIAGNOSIS — Z6823 Body mass index (BMI) 23.0-23.9, adult: Secondary | ICD-10-CM | POA: Diagnosis not present

## 2022-06-13 DIAGNOSIS — E785 Hyperlipidemia, unspecified: Secondary | ICD-10-CM | POA: Diagnosis present

## 2022-06-13 DIAGNOSIS — D631 Anemia in chronic kidney disease: Secondary | ICD-10-CM | POA: Diagnosis present

## 2022-06-13 DIAGNOSIS — E871 Hypo-osmolality and hyponatremia: Secondary | ICD-10-CM | POA: Diagnosis present

## 2022-06-13 DIAGNOSIS — D6481 Anemia due to antineoplastic chemotherapy: Secondary | ICD-10-CM | POA: Diagnosis present

## 2022-06-13 DIAGNOSIS — K5732 Diverticulitis of large intestine without perforation or abscess without bleeding: Secondary | ICD-10-CM | POA: Diagnosis present

## 2022-06-13 DIAGNOSIS — M1990 Unspecified osteoarthritis, unspecified site: Secondary | ICD-10-CM | POA: Diagnosis present

## 2022-06-13 DIAGNOSIS — I10 Essential (primary) hypertension: Secondary | ICD-10-CM | POA: Diagnosis not present

## 2022-06-13 DIAGNOSIS — Z79890 Hormone replacement therapy (postmenopausal): Secondary | ICD-10-CM

## 2022-06-13 DIAGNOSIS — D72810 Lymphocytopenia: Secondary | ICD-10-CM | POA: Diagnosis not present

## 2022-06-13 DIAGNOSIS — Z888 Allergy status to other drugs, medicaments and biological substances status: Secondary | ICD-10-CM

## 2022-06-13 DIAGNOSIS — K5792 Diverticulitis of intestine, part unspecified, without perforation or abscess without bleeding: Secondary | ICD-10-CM | POA: Insufficient documentation

## 2022-06-13 DIAGNOSIS — R197 Diarrhea, unspecified: Secondary | ICD-10-CM | POA: Diagnosis present

## 2022-06-13 DIAGNOSIS — R5081 Fever presenting with conditions classified elsewhere: Secondary | ICD-10-CM | POA: Diagnosis not present

## 2022-06-13 DIAGNOSIS — I129 Hypertensive chronic kidney disease with stage 1 through stage 4 chronic kidney disease, or unspecified chronic kidney disease: Secondary | ICD-10-CM | POA: Diagnosis present

## 2022-06-13 DIAGNOSIS — Z905 Acquired absence of kidney: Secondary | ICD-10-CM

## 2022-06-13 DIAGNOSIS — Z79899 Other long term (current) drug therapy: Secondary | ICD-10-CM

## 2022-06-13 DIAGNOSIS — Z803 Family history of malignant neoplasm of breast: Secondary | ICD-10-CM

## 2022-06-13 DIAGNOSIS — Z886 Allergy status to analgesic agent status: Secondary | ICD-10-CM

## 2022-06-13 DIAGNOSIS — Z885 Allergy status to narcotic agent status: Secondary | ICD-10-CM

## 2022-06-13 DIAGNOSIS — Z90710 Acquired absence of both cervix and uterus: Secondary | ICD-10-CM

## 2022-06-13 DIAGNOSIS — D701 Agranulocytosis secondary to cancer chemotherapy: Secondary | ICD-10-CM | POA: Diagnosis not present

## 2022-06-13 DIAGNOSIS — E872 Acidosis, unspecified: Secondary | ICD-10-CM | POA: Diagnosis present

## 2022-06-13 DIAGNOSIS — N261 Atrophy of kidney (terminal): Secondary | ICD-10-CM | POA: Diagnosis not present

## 2022-06-13 DIAGNOSIS — T451X5A Adverse effect of antineoplastic and immunosuppressive drugs, initial encounter: Secondary | ICD-10-CM | POA: Diagnosis not present

## 2022-06-13 DIAGNOSIS — Z87891 Personal history of nicotine dependence: Secondary | ICD-10-CM | POA: Diagnosis not present

## 2022-06-13 DIAGNOSIS — Z86718 Personal history of other venous thrombosis and embolism: Secondary | ICD-10-CM

## 2022-06-13 DIAGNOSIS — Z91040 Latex allergy status: Secondary | ICD-10-CM

## 2022-06-13 DIAGNOSIS — E876 Hypokalemia: Secondary | ICD-10-CM | POA: Diagnosis not present

## 2022-06-13 DIAGNOSIS — Z880 Allergy status to penicillin: Secondary | ICD-10-CM

## 2022-06-13 DIAGNOSIS — C561 Malignant neoplasm of right ovary: Secondary | ICD-10-CM | POA: Diagnosis present

## 2022-06-13 DIAGNOSIS — I739 Peripheral vascular disease, unspecified: Secondary | ICD-10-CM | POA: Diagnosis present

## 2022-06-13 DIAGNOSIS — E7800 Pure hypercholesterolemia, unspecified: Secondary | ICD-10-CM | POA: Diagnosis not present

## 2022-06-13 DIAGNOSIS — E440 Moderate protein-calorie malnutrition: Secondary | ICD-10-CM | POA: Diagnosis present

## 2022-06-13 DIAGNOSIS — D709 Neutropenia, unspecified: Secondary | ICD-10-CM | POA: Insufficient documentation

## 2022-06-13 DIAGNOSIS — Z9011 Acquired absence of right breast and nipple: Secondary | ICD-10-CM

## 2022-06-13 DIAGNOSIS — E860 Dehydration: Secondary | ICD-10-CM | POA: Diagnosis present

## 2022-06-13 DIAGNOSIS — Z8249 Family history of ischemic heart disease and other diseases of the circulatory system: Secondary | ICD-10-CM

## 2022-06-13 DIAGNOSIS — Z9079 Acquired absence of other genital organ(s): Secondary | ICD-10-CM

## 2022-06-13 DIAGNOSIS — Z7982 Long term (current) use of aspirin: Secondary | ICD-10-CM

## 2022-06-13 DIAGNOSIS — J302 Other seasonal allergic rhinitis: Secondary | ICD-10-CM | POA: Diagnosis present

## 2022-06-13 DIAGNOSIS — N1831 Chronic kidney disease, stage 3a: Secondary | ICD-10-CM | POA: Diagnosis present

## 2022-06-13 DIAGNOSIS — D61818 Other pancytopenia: Secondary | ICD-10-CM | POA: Insufficient documentation

## 2022-06-13 DIAGNOSIS — K5730 Diverticulosis of large intestine without perforation or abscess without bleeding: Secondary | ICD-10-CM | POA: Diagnosis not present

## 2022-06-13 DIAGNOSIS — Z90722 Acquired absence of ovaries, bilateral: Secondary | ICD-10-CM

## 2022-06-13 DIAGNOSIS — Z853 Personal history of malignant neoplasm of breast: Secondary | ICD-10-CM

## 2022-06-13 DIAGNOSIS — Z881 Allergy status to other antibiotic agents status: Secondary | ICD-10-CM

## 2022-06-13 HISTORY — DX: Diverticulitis of intestine, part unspecified, without perforation or abscess without bleeding: K57.92

## 2022-06-13 LAB — CBC WITH DIFFERENTIAL/PLATELET
Abs Immature Granulocytes: 0.06 10*3/uL (ref 0.00–0.07)
Basophils Absolute: 0 10*3/uL (ref 0.0–0.1)
Basophils Relative: 2 %
Eosinophils Absolute: 0 10*3/uL (ref 0.0–0.5)
Eosinophils Relative: 2 %
HCT: 28.7 % — ABNORMAL LOW (ref 36.0–46.0)
Hemoglobin: 10.1 g/dL — ABNORMAL LOW (ref 12.0–15.0)
Immature Granulocytes: 5 %
Lymphocytes Relative: 27 %
Lymphs Abs: 0.3 10*3/uL — ABNORMAL LOW (ref 0.7–4.0)
MCH: 30.4 pg (ref 26.0–34.0)
MCHC: 35.2 g/dL (ref 30.0–36.0)
MCV: 86.4 fL (ref 80.0–100.0)
Monocytes Absolute: 0 10*3/uL — ABNORMAL LOW (ref 0.1–1.0)
Monocytes Relative: 3 %
Neutro Abs: 0.8 10*3/uL — ABNORMAL LOW (ref 1.7–7.7)
Neutrophils Relative %: 61 %
Platelets: 242 10*3/uL (ref 150–400)
RBC Morphology: NONE SEEN
RBC: 3.32 MIL/uL — ABNORMAL LOW (ref 3.87–5.11)
RDW: 13 % (ref 11.5–15.5)
WBC Morphology: ABNORMAL
WBC: 1.2 10*3/uL — CL (ref 4.0–10.5)
nRBC: 0 % (ref 0.0–0.2)

## 2022-06-13 LAB — COMPREHENSIVE METABOLIC PANEL
ALT: 16 U/L (ref 0–44)
AST: 22 U/L (ref 15–41)
Albumin: 3.7 g/dL (ref 3.5–5.0)
Alkaline Phosphatase: 47 U/L (ref 38–126)
Anion gap: 12 (ref 5–15)
BUN: 19 mg/dL (ref 8–23)
CO2: 22 mmol/L (ref 22–32)
Calcium: 9.3 mg/dL (ref 8.9–10.3)
Chloride: 93 mmol/L — ABNORMAL LOW (ref 98–111)
Creatinine, Ser: 1.04 mg/dL — ABNORMAL HIGH (ref 0.44–1.00)
GFR, Estimated: 55 mL/min — ABNORMAL LOW (ref >60)
Glucose, Bld: 137 mg/dL — ABNORMAL HIGH (ref 70–99)
Potassium: 3.8 mmol/L (ref 3.5–5.1)
Sodium: 127 mmol/L — ABNORMAL LOW (ref 135–145)
Total Bilirubin: 0.9 mg/dL (ref 0.3–1.2)
Total Protein: 6.6 g/dL (ref 6.5–8.1)

## 2022-06-13 LAB — URINALYSIS, ROUTINE W REFLEX MICROSCOPIC
Bilirubin Urine: NEGATIVE
Glucose, UA: NEGATIVE mg/dL
Hgb urine dipstick: NEGATIVE
Ketones, ur: NEGATIVE mg/dL
Leukocytes,Ua: NEGATIVE
Nitrite: NEGATIVE
Protein, ur: NEGATIVE mg/dL
Specific Gravity, Urine: 1.006 (ref 1.005–1.030)
pH: 6 (ref 5.0–8.0)

## 2022-06-13 LAB — RENAL FUNCTION PANEL
Albumin: 2.8 g/dL — ABNORMAL LOW (ref 3.5–5.0)
Albumin: 2.7 g/dL — ABNORMAL LOW (ref 3.5–5.0)
Anion gap: 8 (ref 5–15)
Anion gap: 11 (ref 5–15)
BUN: 18 mg/dL (ref 8–23)
BUN: 16 mg/dL (ref 8–23)
CO2: 25 mmol/L (ref 22–32)
CO2: 21 mmol/L — ABNORMAL LOW (ref 22–32)
Calcium: 7.9 mg/dL — ABNORMAL LOW (ref 8.9–10.3)
Calcium: 7.9 mg/dL — ABNORMAL LOW (ref 8.9–10.3)
Chloride: 100 mmol/L (ref 98–111)
Chloride: 99 mmol/L (ref 98–111)
Creatinine, Ser: 1.03 mg/dL — ABNORMAL HIGH (ref 0.44–1.00)
Creatinine, Ser: 0.94 mg/dL (ref 0.44–1.00)
GFR, Estimated: 56 mL/min — ABNORMAL LOW (ref >60)
GFR, Estimated: >60 mL/min (ref >60)
Glucose, Bld: 107 mg/dL — ABNORMAL HIGH (ref 70–99)
Glucose, Bld: 98 mg/dL (ref 70–99)
Phosphorus: 4 mg/dL (ref 2.5–4.6)
Phosphorus: 3.6 mg/dL (ref 2.5–4.6)
Potassium: 3.3 mmol/L — ABNORMAL LOW (ref 3.5–5.1)
Potassium: 3.7 mmol/L (ref 3.5–5.1)
Sodium: 133 mmol/L — ABNORMAL LOW (ref 135–145)
Sodium: 131 mmol/L — ABNORMAL LOW (ref 135–145)

## 2022-06-13 LAB — OSMOLALITY, URINE: Osmolality, Ur: 262 mOsm/kg — ABNORMAL LOW (ref 300–900)

## 2022-06-13 LAB — MAGNESIUM: Magnesium: 1.4 mg/dL — ABNORMAL LOW (ref 1.7–2.4)

## 2022-06-13 LAB — LIPASE, BLOOD: Lipase: 17 U/L (ref 11–51)

## 2022-06-13 LAB — SODIUM, URINE, RANDOM: Sodium, Ur: 41 mmol/L

## 2022-06-13 LAB — LACTIC ACID, PLASMA
Lactic Acid, Venous: 2.8 mmol/L (ref 0.5–1.9)
Lactic Acid, Venous: 2 mmol/L (ref 0.5–1.9)

## 2022-06-13 MED ORDER — LACTATED RINGERS IV SOLN: INTRAVENOUS | Status: DC

## 2022-06-13 MED ORDER — ONDANSETRON HCL 4 MG/2ML IJ SOLN
4.0000 mg | Freq: Once | INTRAMUSCULAR | Status: AC
  Administered 2022-06-13: 4 mg via INTRAVENOUS
  Filled 2022-06-13: qty 2

## 2022-06-13 MED ORDER — SODIUM CHLORIDE 0.9 % IV SOLN
2.0000 g | Freq: Two times a day (BID) | INTRAVENOUS | Status: DC
  Administered 2022-06-13 – 2022-06-17 (×9): 2 g via INTRAVENOUS
  Filled 2022-06-13 (×9): qty 12.5

## 2022-06-13 MED ORDER — RIVAROXABAN 10 MG PO TABS
10.0000 mg | ORAL_TABLET | Freq: Every day | ORAL | Status: DC
  Administered 2022-06-13 – 2022-06-17 (×5): 10 mg via ORAL
  Filled 2022-06-13 (×5): qty 1

## 2022-06-13 MED ORDER — SODIUM CHLORIDE 0.9 % IV SOLN: INTRAVENOUS | Status: DC

## 2022-06-13 MED ORDER — SODIUM CHLORIDE 0.9 % IV SOLN
12.5000 mg | Freq: Four times a day (QID) | INTRAVENOUS | Status: DC | PRN
  Administered 2022-06-13 – 2022-06-17 (×7): 12.5 mg via INTRAVENOUS
  Filled 2022-06-13 (×4): qty 12.5
  Filled 2022-06-13: qty 0.5
  Filled 2022-06-13 (×3): qty 12.5

## 2022-06-13 MED ORDER — SODIUM CHLORIDE 0.9 % IV SOLN: 1.0000 g | Freq: Once | INTRAVENOUS | Status: DC

## 2022-06-13 MED ORDER — ENOXAPARIN SODIUM 40 MG/0.4ML IJ SOSY
40.0000 mg | PREFILLED_SYRINGE | INTRAMUSCULAR | Status: DC
  Filled 2022-06-13: qty 0.4

## 2022-06-13 MED ORDER — ACETAMINOPHEN 325 MG PO TABS
650.0000 mg | ORAL_TABLET | Freq: Four times a day (QID) | ORAL | Status: DC | PRN
  Administered 2022-06-14: 650 mg via ORAL
  Filled 2022-06-13: qty 2

## 2022-06-13 MED ORDER — OXYCODONE HCL 5 MG PO TABS: 5.0000 mg | ORAL_TABLET | ORAL | Status: DC | PRN

## 2022-06-13 MED ORDER — METRONIDAZOLE 500 MG/100ML IV SOLN
500.0000 mg | Freq: Three times a day (TID) | INTRAVENOUS | Status: DC
  Administered 2022-06-13 – 2022-06-17 (×12): 500 mg via INTRAVENOUS
  Filled 2022-06-13 (×12): qty 100

## 2022-06-13 MED ORDER — METRONIDAZOLE 500 MG/100ML IV SOLN
500.0000 mg | Freq: Once | INTRAVENOUS | Status: AC
  Administered 2022-06-13: 500 mg via INTRAVENOUS
  Filled 2022-06-13: qty 100

## 2022-06-13 MED ORDER — POTASSIUM CHLORIDE CRYS ER 20 MEQ PO TBCR
40.0000 meq | EXTENDED_RELEASE_TABLET | Freq: Every day | ORAL | Status: DC
  Administered 2022-06-13 – 2022-06-14 (×2): 40 meq via ORAL
  Filled 2022-06-13 (×2): qty 2

## 2022-06-13 MED ORDER — FENTANYL CITRATE PF 50 MCG/ML IJ SOSY: 12.5000 ug | PREFILLED_SYRINGE | INTRAMUSCULAR | Status: DC | PRN

## 2022-06-13 MED ORDER — FENTANYL CITRATE PF 50 MCG/ML IJ SOSY
50.0000 ug | PREFILLED_SYRINGE | Freq: Once | INTRAMUSCULAR | Status: AC
  Administered 2022-06-13: 50 ug via INTRAVENOUS
  Filled 2022-06-13: qty 1

## 2022-06-13 MED ORDER — ONDANSETRON HCL 4 MG/2ML IJ SOLN
4.0000 mg | Freq: Four times a day (QID) | INTRAMUSCULAR | Status: DC | PRN
  Administered 2022-06-13 (×2): 4 mg via INTRAVENOUS
  Filled 2022-06-13 (×2): qty 2

## 2022-06-13 MED ORDER — ACETAMINOPHEN 650 MG RE SUPP: 650.0000 mg | Freq: Four times a day (QID) | RECTAL | Status: DC | PRN

## 2022-06-13 MED ORDER — LACTATED RINGERS IV BOLUS
1000.0000 mL | Freq: Once | INTRAVENOUS | Status: AC
  Administered 2022-06-13: 1000 mL via INTRAVENOUS

## 2022-06-13 NOTE — ED Notes (Signed)
Dr. Karle Starch aware of 2.8 lactic

## 2022-06-13 NOTE — Progress Notes (Signed)
Pt arrived to unit approx 0620, alert and oriented x4, oriented to surroundings call light  within reach.   Admitting notified of pt's arrival to unit.

## 2022-06-13 NOTE — ED Notes (Signed)
Family at bedside. 

## 2022-06-13 NOTE — ED Provider Notes (Signed)
[facility]  Provider Note  CSN: [id number] Arrival date & time: 06/13/22 0219  History Chief Complaint  Patient presents with   Diarrhea    April Weber is a 77 y.o. female with history of ovarian cancer, currently getting chemo, last dose was 6 days ago. She reports 3 days ago she thought she was getting constipated so she took a 'mild laxative' but has had worsening profuse watery, non bloody, diarrhea since then associated with nausea and abdominal cramping. No fevers. She was having so much diarrhea tonight she was unable to get up off the toilet for about 79mn then felt weak and dehydrated.    Home Medications Prior to Admission medications   Medication Sig Start Date End Date Taking? Authorizing Provider  acetaminophen (TYLENOL) 500 MG tablet Take 500-1,000 mg by mouth daily as needed for moderate pain, fever or headache.    [provider]  amLODipine (NORVASC) 5 MG tablet Take 1 tablet (5 mg total) by mouth in the morning. 06/01/22   GJanora Norlander DO  aspirin EC 81 MG tablet Take 81 mg by mouth daily. Swallow whole.    [provider]  atenolol (TENORMIN) 25 MG tablet Take 0.5 tablets (12.5 mg total) by mouth 2 (two) times daily. 06/01/22   GJanora Norlander DO  Calcium Carb-Cholecalciferol (CALCIUM + VITAMIN D3 PO) Take 1 tablet by mouth 2 (two) times daily.    [provider]  dexamethasone (DECADRON) 4 MG tablet Take 2 tabs at the day before and 1 tab daily for 2 days after chemotherapy 05/17/22   GHeath Lark MD  EPINEPHrine 0.3 mg/0.3 mL IJ SOAJ injection Inject 0.3 mg into the muscle as needed for anaphylaxis. 05/24/22   MHassell Done Mary-Margaret, FNP  levothyroxine (SYNTHROID) 100 MCG tablet Take 1 tablet (100 mcg total) by mouth in the morning. 06/01/22   GJanora Norlander DO  lidocaine-prilocaine (EMLA) cream Apply to affected area once 04/20/22   GHeath Lark MD  loratadine (CLARITIN) 10 MG  tablet Take 10 mg by mouth at bedtime.    [provider]  Multiple Vitamin (MULTIVITAMIN) tablet Take 1 tablet by mouth in the morning.    [provider]  ondansetron (ZOFRAN) 8 MG tablet Take 1 tablet (8 mg total) by mouth every 8 (eight) hours as needed for nausea or vomiting. Start on the third day after chemotherapy. 04/20/22   GHeath Lark MD  prochlorperazine (COMPAZINE) 10 MG tablet Take 1 tablet (10 mg total) by mouth every 6 (six) hours as needed for nausea or vomiting. 04/20/22   GHeath Lark MD  simvastatin (ZOCOR) 20 MG tablet Take 1 tablet (20 mg total) by mouth at bedtime. 06/01/22   GJanora Norlander DO     Allergies    Paclitaxel, Emend [fosaprepitant dimeglumine], Codeine, Nsaids, Penicillins, Vibra-tab [doxycycline], Iodine, and Latex   Review of Systems   Review of Systems Please see HPI for pertinent positives and negatives  Physical Exam BP (!) 140/73   Pulse 77   Temp 98 F (36.7 C)   Resp 16   SpO2 100%   Physical Exam Vitals and nursing note reviewed.  Constitutional:      Appearance: Normal appearance.  HENT:     Head: Normocephalic and atraumatic.     Nose: Nose normal.     Mouth/Throat:     Mouth: Mucous membranes are dry.  Eyes:     Extraocular Movements: Extraocular movements intact.  Conjunctiva/sclera: Conjunctivae normal.  Cardiovascular:     Rate and Rhythm: Normal rate.  Pulmonary:     Effort: Pulmonary effort is normal.     Breath sounds: Normal breath sounds.  Abdominal:     Palpations: Abdomen is soft. There is no mass.     Tenderness: There is no abdominal tenderness (diffuse, moderate). There is no guarding.  Musculoskeletal:        General: No swelling. Normal range of motion.     Cervical back: Neck supple.  Skin:    General: Skin is warm and dry.  Neurological:     General: No focal deficit present.     Mental Status: She is alert.  Psychiatric:        Mood and Affect: Mood normal.     ED  Results / Procedures / Treatments   EKG None  Procedures Procedures  Medications Ordered in the ED Medications  metroNIDAZOLE (FLAGYL) IVPB 500 mg (500 mg Intravenous New Bag/Given 06/13/22 0440)  lactated ringers infusion ( Intravenous New Bag/Given 06/13/22 0438)  ceFEPIme (MAXIPIME) 2 g in sodium chloride 0.9 % 100 mL IVPB (has no administration in time range)  ondansetron (ZOFRAN) injection 4 mg (4 mg Intravenous Given 06/13/22 0249)  lactated ringers bolus 1,000 mL (0 mLs Intravenous Stopped 06/13/22 0406)  fentaNYL (SUBLIMAZE) injection 50 mcg (50 mcg Intravenous Given 06/13/22 0249)    Initial Impression and Plan  Patient here with significant diarrhea after recent chemo. No fevers. No recent antibiotics. She only has one kidney (the other one is atrophic from injury during surgery 40 years ago). She has some nonfocal abdominal tenderness. Will check labs, send for CT.  Pain/nausea meds for comfort. IVF for dehydration. Stool studies ordered as well.   ED Course   Clinical Course as of 06/13/22 0446  Nancy Fetter Jun 13, 2022  0318 CMP with mild hyponatremia, otherwise unremarkable. Lipase is normal.  [CS]  0323 Lactic acid is mildly elevated. [CS]  0345 I personally viewed the images from radiology studies and agree with radiologist interpretation: CT with possible diverticulitis. Otherwise no concerning or acute findings.  [CS]  0345 CBC shows mild anemia, WBC and diff are pending.  [CS]  0407 WBC is low. Diff is still pending.  [CS]  0424 Diff shows all cell lines are down, including neutropenia. Will begin Abx, additional IVF and plan admission for further management.  [CS]  [phone] Spoke with Dr. Bridgett Larsson, Hospitalist, who will accept for admission. Pharmacy to help manage antibiotics. [CS]    Clinical Course User Index [CS] Truddie Hidden, MD     MDM Rules/Calculators/A&P Medical Decision Making Problems Addressed: Chemotherapy-induced neutropenia [facility]): acute illness or  injury Diverticulitis: acute illness or injury  Amount and/or Complexity of Data Reviewed Labs: ordered. Decision-making details documented in ED Course. Radiology: ordered and independent interpretation performed. Decision-making details documented in ED Course.  Risk Prescription drug management. Parenteral controlled substances. Decision regarding hospitalization.     Final Clinical Impression(s) / ED Diagnoses Final diagnoses:  Chemotherapy-induced neutropenia ([place])  Diverticulitis    Rx / DC Orders ED Discharge Orders     None        Truddie Hidden, MD 06/13/22 [phone]

## 2022-06-13 NOTE — ED Notes (Signed)
Patient transported to CT 

## 2022-06-13 NOTE — Progress Notes (Signed)
Mobility Specialist - Progress Note   06/13/22 1328  Mobility  Activity Ambulated with assistance in hallway  Level of Assistance Modified independent, requires aide device or extra time  Assistive Device Other (Comment) (IV Pole)  Distance Ambulated (ft) 500 ft  Activity Response Tolerated well  Mobility Referral Yes  $Mobility charge 1 Mobility   Pt received in bed and agreeable to mobility. No complaints during session. Pt to bed after session with all needs met w/ family in room.    [facility]

## 2022-06-13 NOTE — ED Triage Notes (Signed)
Pt c/o diarrhea off & on since Wednesday; imodium "slowed it up some" but tonight has been dehydrated. Pt reports 3rd chemo tx Monday, reported "some constipation, took a real mild laxitive Wednesday morning & then it went haywire."

## 2022-06-13 NOTE — H&P (Signed)
History and Physical    Patient: April Weber XBM:[id number] DOB: 1945/09/17 DOA: 06/13/2022 DOS: the patient was seen and examined on 06/13/2022 PCP: [name], DO  Patient coming from: Home  Chief Complaint:  Chief Complaint  Patient presents with   Diarrhea   HPI: April Weber is a 77 y.o. female with medical history significant of right ovarian epithelial CA, anemia of chronic disease, hypothyroidism, HLD, HTN. Presenting with diarrhea and weakness. She reports that her last session of chemo was about a week ago. Since that time she has had persistent diarrhea. He has tried imodium but it has not been helpful. She has not had any fevers. She has had nausea that has not been completely relieved by her compazine. She has not had any sick contacts or recent abx use. She has had poor PO intake over this time. When her symptoms did not improve last night, she decided to come to the ED for evaluation. She denies any other aggravating or alleviating factors.    Review of Systems: As mentioned in the history of present illness. All other systems reviewed and are negative. Past Medical History:  Diagnosis Date   Arthritis    Breast cancer ([place]) 1980   RIGHT   Chronic kidney disease    has one kidney   DVT (deep venous thrombosis) (HCC)    on RIGHT leg-hx of   Dyspnea    Hyperlipidemia    on meds   Hypertension    on meds   Osteopenia    Peripheral vascular disease (HCC)    PONV (postoperative nausea and vomiting)    Seasonal allergies    Thyroid disease    on meds   Past Surgical History:  Procedure Laterality Date   ABDOMINAL HYSTERECTOMY     APPENDECTOMY     BREAST BIOPSY     BREAST LUMPECTOMY WITH RADIOACTIVE SEED LOCALIZATION Left 12/01/2021   Procedure: LEFT BREAST RADIOACTIVE SEED GUIDED LUMPECTOMY;  Surgeon: Coralie Keens, MD;  Location: [facility];  Service: General;  Laterality: Left;  LMA   COLONOSCOPY  2011   Dr.[email or web address]-normal    DEBULKING N/A 03/30/2022   Procedure: TUMOR DEBULKING;  Surgeon: Lafonda Mosses, MD;  Location: [facility];  Service: Gynecology;  Laterality: N/A;   EYE SURGERY     77 years old   IR IMAGING GUIDED PORT INSERTION  03/19/2022   LAPAROSCOPY N/A 03/30/2022   Procedure: LAPAROSCOPY DIAGNOSTIC;  Surgeon: Lafonda Mosses, MD;  Location[facility];  Service: Gynecology;  Laterality: N/A;   MASTECTOMY Right 1984   ROTATOR CUFF REPAIR     SALPINGOOPHORECTOMY Bilateral 03/30/2022   Procedure: OPEN SALPINGO OOPHORECTOMY;  Surgeon: Lafonda Mosses, MD;  Location[facility];  Service: Gynecology;  Laterality: Bilateral;   THYROID SURGERY Bilateral 1967   VARICOSE VEIN SURGERY     ablation   WISDOM TOOTH EXTRACTION     Social History:  reports that she quit smoking about 55 years ago. Her smoking use included cigarettes. She smoked an average of .25 packs per day. She has never used smokeless tobacco. She reports that she does not drink alcohol and does not use drugs.  Allergies  Allergen Reactions   Paclitaxel Anaphylaxis   Emend [Fosaprepitant Dimeglumine] Other (See Comments)    3 minutes into emend infusion patient began having facial flushing and redness to bilateral hands and neck, and light headedness   Codeine Nausea And Vomiting   Nsaids Other (See Comments)  Told to avoid due to kidney function   Penicillins Hives   Vibra-Tab [Doxycycline] Hives   Iodine Rash   Latex Rash    Occasionally gets a localized rash on contact with certain latex products    Family History  Problem Relation Age of Onset   Heart attack Mother    Alcohol abuse Mother    Lung cancer Mother        d. mid 79s   Breast cancer Maternal Aunt 75   Colon polyps Neg Hx    Colon cancer Neg Hx    Esophageal cancer Neg Hx    Stomach cancer Neg Hx    Rectal cancer Neg Hx     Prior to Admission medications   Medication Sig Start Date End Date Taking? Authorizing Provider  acetaminophen (TYLENOL) 500 MG  tablet Take 500-1,000 mg by mouth daily as needed for moderate pain, fever or headache.    [provider]  amLODipine (NORVASC) 5 MG tablet Take 1 tablet (5 mg total) by mouth in the morning. 06/01/22   [name], DO  aspirin EC 81 MG tablet Take 81 mg by mouth daily. Swallow whole.    [provider]  atenolol (TENORMIN) 25 MG tablet Take 0.5 tablets (12.5 mg total) by mouth 2 (two) times daily. 06/01/22   [name], DO  Calcium Carb-Cholecalciferol (CALCIUM + VITAMIN D3 PO) Take 1 tablet by mouth 2 (two) times daily.    [provider]  dexamethasone (DECADRON) 4 MG tablet Take 2 tabs at the day before and 1 tab daily for 2 days after chemotherapy 05/17/22   [name], MD  EPINEPHrine 0.3 mg/0.3 mL IJ SOAJ injection Inject 0.3 mg into the muscle as needed for anaphylaxis. 05/24/22   Hassell Done, Mary-Margaret, FNP  levothyroxine (SYNTHROID) 100 MCG tablet Take 1 tablet (100 mcg total) by mouth in the morning. 06/01/22   [name], DO  lidocaine-prilocaine (EMLA) cream Apply to affected area once 04/20/22   [name], MD  loratadine (CLARITIN) 10 MG tablet Take 10 mg by mouth at bedtime.    [provider]  Multiple Vitamin (MULTIVITAMIN) tablet Take 1 tablet by mouth in the morning.    [provider]  ondansetron (ZOFRAN) 8 MG tablet Take 1 tablet (8 mg total) by mouth every 8 (eight) hours as needed for nausea or vomiting. Start on the third day after chemotherapy. 04/20/22   [name], MD  prochlorperazine (COMPAZINE) 10 MG tablet Take 1 tablet (10 mg total) by mouth every 6 (six) hours as needed for nausea or vomiting. 04/20/22   [name], MD  simvastatin (ZOCOR) 20 MG tablet Take 1 tablet (20 mg total) by mouth at bedtime. 06/01/22   [name], DO    Physical Exam: Vitals:   06/13/22 8527 06/13/22 0517 06/13/22 0625 06/13/22 0645  BP:  (!) 152/74 113/65   Pulse:  78 77   Resp:  16 19   Temp: 98 F (36.7  C) 98.4 F (36.9 C) 98.3 F (36.8 C)   TempSrc:   Oral   SpO2:  100% 99%   Weight:    69.4 kg   General: 77 y.o. female resting in bed in NAD Eyes: PERRL, normal sclera ENMT: Nares patent w/o discharge, orophaynx clear, dentition normal, ears w/o discharge/lesions/ulcers Neck: Supple, trachea midline Cardiovascular: RRR, +S1, S2, no m/g/r, equal pulses throughout Respiratory: CTABL, no w/r/r, normal WOB GI: BS+, ND, mild global TTP, no masses noted, no  organomegaly noted MSK: No e/c/c Neuro: A&O x 3, no focal deficits Psyc: Appropriate interaction and affect, calm/cooperative  Data Reviewed:  Results for orders placed or performed during the hospital encounter of 06/13/22 (from the past 24 hour(s))  Comprehensive metabolic panel     Status: Abnormal   Collection Time: 06/13/22  2:50 AM  Result Value Ref Range   Sodium 127 (L) 135 - 145 mmol/L   Potassium 3.8 3.5 - 5.1 mmol/L   Chloride 93 (L) 98 - 111 mmol/L   CO2 22 22 - 32 mmol/L   Glucose, Bld 137 (H) 70 - 99 mg/dL   BUN 19 8 - 23 mg/dL   Creatinine, Ser 1.04 (H) 0.44 - 1.00 mg/dL   Calcium 9.3 8.9 - 10.3 mg/dL   Total Protein 6.6 6.5 - 8.1 g/dL   Albumin 3.7 3.5 - 5.0 g/dL   AST 22 15 - 41 U/L   ALT 16 0 - 44 U/L   Alkaline Phosphatase 47 38 - 126 U/L   Total Bilirubin 0.9 0.3 - 1.2 mg/dL   GFR, Estimated 55 (L) >60 mL/min   Anion gap 12 5 - 15  Lipase, blood     Status: None   Collection Time: 06/13/22  2:50 AM  Result Value Ref Range   Lipase 17 11 - 51 U/L  CBC with Differential     Status: Abnormal   Collection Time: 06/13/22  2:50 AM  Result Value Ref Range   WBC 1.2 (LL) 4.0 - 10.5 K/uL   RBC 3.32 (L) 3.87 - 5.11 MIL/uL   Hemoglobin 10.1 (L) 12.0 - 15.0 g/dL   HCT 28.7 (L) 36.0 - 46.0 %   MCV 86.4 80.0 - 100.0 fL   MCH 30.4 26.0 - 34.0 pg   MCHC 35.2 30.0 - 36.0 g/dL   RDW 13.0 11.5 - 15.5 %   Platelets 242 150 - 400 K/uL   nRBC 0.0 0.0 - 0.2 %   Neutrophils Relative % 61 %   Neutro Abs 0.8 (L) 1.7  - 7.7 K/uL   Lymphocytes Relative 27 %   Lymphs Abs 0.3 (L) 0.7 - 4.0 K/uL   Monocytes Relative 3 %   Monocytes Absolute 0.0 (L) 0.1 - 1.0 K/uL   Eosinophils Relative 2 %   Eosinophils Absolute 0.0 0.0 - 0.5 K/uL   Basophils Relative 2 %   Basophils Absolute 0.0 0.0 - 0.1 K/uL   WBC Morphology Abnormal lymphocytes present    RBC Morphology NO SCHISTOCYTES SEEN    Immature Granulocytes 5 %   Abs Immature Granulocytes 0.06 0.00 - 0.07 K/uL   Abnormal Lymphocytes Present PRESENT    Acanthocytes PRESENT    Tear Drop Cells PRESENT    Ovalocytes PRESENT    Giant PLTs PRESENT   Lactic acid, plasma     Status: Abnormal   Collection Time: 06/13/22  2:50 AM  Result Value Ref Range   Lactic Acid, Venous 2.8 (HH) 0.5 - 1.9 mmol/L  Lactic acid, plasma     Status: Abnormal   Collection Time: 06/13/22  4:36 AM  Result Value Ref Range   Lactic Acid, Venous 2.0 (HH) 0.5 - 1.9 mmol/L   CT ab/pelvis: Colonic diverticulosis. Inflammatory stranding around the mid sigmoid colon compatible with active diverticulitis. Interval resection of the previously seen lower pelvic solid and cystic mass and resolution of ascites. Aortic atherosclerosis. Severely atrophic left kidney, stable since prior study.  Assessment and Plan: Diverticulitis     - admit  to inpt, med-surg     - continue cefepime/flagyl     - follow stool studies     - fluids  Hyponatremia     - check urine osm/Na+     - NS @ 125     - check q6h renal function panels  Pancytopenia Neutropenia     - secondary to chemo     - onco notified of admission; appreciate assistance; defer colony stimulator to them ([place] 800)  Right epithelial ovarian cancer     - follows w/ Dr. Alvy Bimler; added to care team  HTN     - soft pressures this AM; hold home regimen for now  HLD     - continue home regimen when confirmed  Hypothyroidism     - continue home regimen when confirmed  CKD 3a     - at baseline, follow  [facility]  Planning:   Code Status: FULL  Consults: Oncology  Family Communication: w/ family at bedside  Severity of Illness: The appropriate patient status for this patient is INPATIENT. Inpatient status is judged to be reasonable and necessary in order to provide the required intensity of service to ensure the patient's safety. The patient's presenting symptoms, physical exam findings, and initial radiographic and laboratory data in the context of their chronic comorbidities is felt to place them at high risk for further clinical deterioration. Furthermore, it is not anticipated that the patient will be medically stable for discharge from the hospital within 2 midnights of admission.   * I certify that at the point of admission it is my clinical judgment that the patient will require inpatient hospital care spanning beyond 2 midnights from the point of admission due to high intensity of service, high risk for further deterioration and high frequency of surveillance required.*  Author: Jonnie Finner, DO 06/13/2022 7:04 AM  For on call review [email or web address].

## 2022-06-13 NOTE — ED Notes (Signed)
Dr. Karle Starch aware of WBC 1.2.

## 2022-06-13 NOTE — Plan of Care (Signed)
   Patient Name: April Weber, April Weber DOB: April 18, 1946 MRN: [id number] Transferring facility: [facility] Requesting provider: sheldon, md Reason for transfer:  77 yo WF with ovarian cancer, s/p 3rd round of chemo this past monday, with diarrhea x 4 days. neutropenic, WBC 1.2, hyponatremia 127.  CT abd shows sigmoid diverticulitis. started on cefepime/flagyl. Going to: [facility] Admission Status: observation Bed Type: med/surg To Do: consult heme/onc. may need G-CSF  TRH will assume care on arrival to accepting facility. Until arrival, medical decision making responsibilities remain with the EDP.  However, [facility] available 24/7 for questions and assistance.   Nursing staff please page [place] and Consults [phone]) as soon as the patient arrives to the hospital.  Kristopher Oppenheim, DO [facility]

## 2022-06-13 NOTE — Progress Notes (Signed)
Pharmacy Antibiotic Note  April Weber is a 77 y.o. female admitted on 06/13/2022 with  r/o abd infection .  Pharmacy has been consulted for cefepime dosing.  Pt is actively on chemo for her ovarian CA. She presented with severe diarrhea and ANC of 0.8. Cefepime added empirically. She has one kidney.   Scr 1  Plan: Cefepime 2g IV q12 Flagyl '500mg'$  IV q8     Temp (24hrs), Avg:98 F (36.7 C), Min:98 F (36.7 C), Max:98 F (36.7 C)  Recent Labs  Lab 06/07/22 0824 06/13/22 0250  WBC 7.9 1.2*  CREATININE 0.98 1.04*  LATICACIDVEN  --  2.8*    Estimated Creatinine Clearance: 44.1 mL/min (A) (by C-G formula based on SCr of 1.04 mg/dL (H)).    Allergies  Allergen Reactions   Paclitaxel Anaphylaxis   Emend [Fosaprepitant Dimeglumine] Other (See Comments)    3 minutes into emend infusion patient began having facial flushing and redness to bilateral hands and neck, and light headedness   Codeine Nausea And Vomiting   Nsaids Other (See Comments)    Told to avoid due to kidney function   Penicillins Hives   Vibra-Tab [Doxycycline] Hives   Iodine Rash   Latex Rash    Occasionally gets a localized rash on contact with certain latex products    Antimicrobials this admission: 2/4 flagyl>> 2/4 cefepime>>  Dose adjustments this admission:   Microbiology results: C.diff/GI panel>>pending   Onnie Boer, PharmD, BCIDP, AAHIVP, CPP Infectious Disease Pharmacist 06/13/2022 4:51 AM

## 2022-06-14 DIAGNOSIS — E872 Acidosis, unspecified: Secondary | ICD-10-CM

## 2022-06-14 DIAGNOSIS — T451X5A Adverse effect of antineoplastic and immunosuppressive drugs, initial encounter: Secondary | ICD-10-CM

## 2022-06-14 DIAGNOSIS — I10 Essential (primary) hypertension: Secondary | ICD-10-CM

## 2022-06-14 DIAGNOSIS — D701 Agranulocytosis secondary to cancer chemotherapy: Secondary | ICD-10-CM

## 2022-06-14 DIAGNOSIS — D6481 Anemia due to antineoplastic chemotherapy: Secondary | ICD-10-CM

## 2022-06-14 DIAGNOSIS — C561 Malignant neoplasm of right ovary: Secondary | ICD-10-CM

## 2022-06-14 DIAGNOSIS — K5732 Diverticulitis of large intestine without perforation or abscess without bleeding: Secondary | ICD-10-CM | POA: Diagnosis not present

## 2022-06-14 DIAGNOSIS — D709 Neutropenia, unspecified: Secondary | ICD-10-CM | POA: Insufficient documentation

## 2022-06-14 DIAGNOSIS — E871 Hypo-osmolality and hyponatremia: Secondary | ICD-10-CM

## 2022-06-14 DIAGNOSIS — N1831 Chronic kidney disease, stage 3a: Secondary | ICD-10-CM

## 2022-06-14 DIAGNOSIS — E039 Hypothyroidism, unspecified: Secondary | ICD-10-CM

## 2022-06-14 DIAGNOSIS — D72810 Lymphocytopenia: Secondary | ICD-10-CM | POA: Insufficient documentation

## 2022-06-14 DIAGNOSIS — E7800 Pure hypercholesterolemia, unspecified: Secondary | ICD-10-CM

## 2022-06-14 LAB — RENAL FUNCTION PANEL
Albumin: 2.6 g/dL — ABNORMAL LOW (ref 3.5–5.0)
Albumin: 2.8 g/dL — ABNORMAL LOW (ref 3.5–5.0)
Anion gap: 6 (ref 5–15)
Anion gap: 8 (ref 5–15)
BUN: 14 mg/dL (ref 8–23)
BUN: 16 mg/dL (ref 8–23)
CO2: 20 mmol/L — ABNORMAL LOW (ref 22–32)
CO2: 21 mmol/L — ABNORMAL LOW (ref 22–32)
Calcium: 7.5 mg/dL — ABNORMAL LOW (ref 8.9–10.3)
Calcium: 7.6 mg/dL — ABNORMAL LOW (ref 8.9–10.3)
Chloride: 101 mmol/L (ref 98–111)
Chloride: 99 mmol/L (ref 98–111)
Creatinine, Ser: 0.81 mg/dL (ref 0.44–1.00)
Creatinine, Ser: 0.89 mg/dL (ref 0.44–1.00)
GFR, Estimated: >60 mL/min (ref >60)
GFR, Estimated: >60 mL/min (ref >60)
Glucose, Bld: 106 mg/dL — ABNORMAL HIGH (ref 70–99)
Glucose, Bld: 98 mg/dL (ref 70–99)
Phosphorus: 2.7 mg/dL (ref 2.5–4.6)
Phosphorus: 3 mg/dL (ref 2.5–4.6)
Potassium: 3.5 mmol/L (ref 3.5–5.1)
Potassium: 3.5 mmol/L (ref 3.5–5.1)
Sodium: 127 mmol/L — ABNORMAL LOW (ref 135–145)
Sodium: 128 mmol/L — ABNORMAL LOW (ref 135–145)

## 2022-06-14 LAB — CBC WITH DIFFERENTIAL/PLATELET
Abs Immature Granulocytes: 0.03 10*3/uL (ref 0.00–0.07)
Basophils Absolute: 0 10*3/uL (ref 0.0–0.1)
Basophils Relative: 2 %
Eosinophils Absolute: 0 10*3/uL (ref 0.0–0.5)
Eosinophils Relative: 0 %
HCT: 22.4 % — ABNORMAL LOW (ref 36.0–46.0)
Hemoglobin: 7.8 g/dL — ABNORMAL LOW (ref 12.0–15.0)
Immature Granulocytes: 5 %
Lymphocytes Relative: 54 %
Lymphs Abs: 0.3 10*3/uL — ABNORMAL LOW (ref 0.7–4.0)
MCH: 31.1 pg (ref 26.0–34.0)
MCHC: 34.8 g/dL (ref 30.0–36.0)
MCV: 89.2 fL (ref 80.0–100.0)
Monocytes Absolute: 0.1 10*3/uL (ref 0.1–1.0)
Monocytes Relative: 17 %
Neutro Abs: 0.1 10*3/uL — CL (ref 1.7–7.7)
Neutrophils Relative %: 22 %
Platelets: 159 10*3/uL (ref 150–400)
RBC: 2.51 MIL/uL — ABNORMAL LOW (ref 3.87–5.11)
RDW: 13.2 % (ref 11.5–15.5)
WBC: 0.6 10*3/uL — CL (ref 4.0–10.5)
nRBC: 0 % (ref 0.0–0.2)

## 2022-06-14 LAB — GASTROINTESTINAL PANEL BY PCR, STOOL (REPLACES STOOL CULTURE)

## 2022-06-14 LAB — C DIFFICILE QUICK SCREEN W PCR REFLEX
C Diff antigen: NEGATIVE
C Diff interpretation: NOT DETECTED
C Diff toxin: NEGATIVE

## 2022-06-14 LAB — LACTIC ACID, PLASMA: Lactic Acid, Venous: 0.5 mmol/L (ref 0.5–1.9)

## 2022-06-14 MED ORDER — SODIUM CHLORIDE 0.9 % IV SOLN: INTRAVENOUS | Status: DC

## 2022-06-14 MED ORDER — ADULT MULTIVITAMIN W/MINERALS CH
1.0000 | ORAL_TABLET | Freq: Every day | ORAL | Status: DC
  Administered 2022-06-14 – 2022-06-17 (×4): 1 via ORAL
  Filled 2022-06-14 (×4): qty 1

## 2022-06-14 MED ORDER — KATE FARMS STANDARD 1.4 PO LIQD
325.0000 mL | Freq: Two times a day (BID) | ORAL | Status: DC
  Administered 2022-06-14: 325 mL via ORAL
  Filled 2022-06-14 (×8): qty 325

## 2022-06-14 MED ORDER — LOPERAMIDE HCL 2 MG PO CAPS
2.0000 mg | ORAL_CAPSULE | ORAL | Status: DC | PRN
  Administered 2022-06-15 – 2022-06-17 (×4): 2 mg via ORAL
  Filled 2022-06-14 (×4): qty 1

## 2022-06-14 MED ORDER — TIZANIDINE HCL 4 MG PO TABS
2.0000 mg | ORAL_TABLET | Freq: Once | ORAL | Status: AC
  Administered 2022-06-14: 2 mg via ORAL
  Filled 2022-06-14: qty 1

## 2022-06-14 MED ORDER — BANATROL TF EN LIQD
60.0000 mL | Freq: Two times a day (BID) | ENTERAL | Status: DC
  Administered 2022-06-14 (×2): 60 mL
  Filled 2022-06-14 (×7): qty 60

## 2022-06-14 MED ORDER — TBO-FILGRASTIM 300 MCG/0.5ML ~~LOC~~ SOSY
300.0000 ug | PREFILLED_SYRINGE | Freq: Every day | SUBCUTANEOUS | Status: DC
  Administered 2022-06-14 – 2022-06-16 (×3): 300 ug via SUBCUTANEOUS
  Filled 2022-06-14 (×4): qty 0.5

## 2022-06-14 NOTE — TOC Progression Note (Signed)
Transition of Care [facility]) - Progression Note    Patient Details  Name: April Weber MRN: [id number] Date of Birth: 08/14/1945  Transition of [facility]) CM/SW [place], RN Phone [phone]  06/14/2022, 1:35 PM  Clinical Narrative:     Transition of Care [facility]) Screening Note   Patient Details  Name: April Weber Date of Birth: 1946-05-06   Transition of Care [facility]) CM/SW Contact:    Angelita Ingles, RN Phone Number: 06/14/2022, 1:35 PM    Transition of Care Department [facility]) has reviewed patient and no TOC needs have been identified at this time. We will continue to monitor patient advancement through interdisciplinary progression rounds. If new patient transition needs arise, please place a TOC consult.          Expected Discharge Plan and Services                                               Social Determinants of Health (SDOH) Interventions SDOH Screenings   Food Insecurity: No Food Insecurity (06/13/2022)  Housing: Low Risk  (06/13/2022)  Transportation Needs: No Transportation Needs (06/13/2022)  Utilities: Not At Risk (06/13/2022)  Alcohol Screen: Low Risk  (08/26/2021)  Depression (PHQ2-9): Low Risk  (06/01/2022)  Financial Resource Strain: Low Risk  (04/08/2022)  Physical Activity: Sufficiently Active (08/26/2021)  Social Connections: Moderately Integrated (08/26/2021)  Stress: No Stress Concern Present (08/26/2021)  Tobacco Use: Medium Risk (06/13/2022)    Readmission Risk Interventions     No data to display

## 2022-06-14 NOTE — TOC Initial Note (Signed)
Transition of Care [facility]) - Initial/Assessment Note    Patient Details  Name: April Weber MRN: [id number] Date of Birth: May 22, 1945  Transition [facility] Care [facility]) CM/SW Contact:    Angelita Ingles, RN Phone Number:[phone]  06/14/2022, 2:10 PM  Clinical Narrative:                 TOC assessment for patient with high risk for readmission. Patient states that she comes from home where she lives alone but has neighbors that are very close and helpful. Patient states that she does have a PCP at [place] that she follows up with on a regular basis. Patient unable to recall MD name at this time. Patients pharmacy of choice is [facility]. Patient states that meds are affordable and she does take them on a regular basis. Patient reports no DME/ home health services in the home and she does feel safe in her current living environment. Currently there are no TOC needs noted. TOC will follow for any disposition needs.   Expected Discharge Plan: Home/Self Care Barriers to Discharge: Continued Medical Work up   Patient Goals and CMS Choice Patient states their goals for this hospitalization and ongoing recovery are:: Wants to feel better CMS Medicare.gov Compare Post Acute Care list provided to::  (n/a)   [place] ownership interest in [facility].provided to::  (n/a)    Expected Discharge Plan and Services In-house Referral: NA Discharge Planning Services: NA Post Acute Care Choice: NA Living arrangements for the past 2 months: Single Family Home                 DME Arranged: N/A DME Agency: NA       HH Arranged: NA [place] Agency: NA        Prior Living Arrangements/Services Living arrangements for the past 2 months: [place] with:: Self Patient language and need for interpreter reviewed:: Yes Do you feel safe going back to the place where you live?: Yes      Need for Family Participation in Patient Care: Yes (Comment) Care giver support  system in place?: Yes (comment) Current home services:  (n/a) Criminal Activity/Legal Involvement Pertinent to Current Situation/Hospitalization: No - Comment as needed  Activities of Daily Living Home Assistive Devices/Equipment: Eyeglasses ADL Screening (condition at time of admission) Patient's cognitive ability adequate to safely complete daily activities?: Yes Is the patient deaf or have difficulty hearing?: No Does the patient have difficulty seeing, even when wearing glasses/contacts?: No Does the patient have difficulty concentrating, remembering, or making decisions?: No Patient able to express need for assistance with ADLs?: Yes Does the patient have difficulty dressing or bathing?: No Independently performs ADLs?: Yes (appropriate for developmental age) Does the patient have difficulty walking or climbing stairs?: No Weakness of Legs: None Weakness of Arms/Hands: None  Permission Sought/Granted Permission sought to share information with : Family Supports Permission granted to share information with : No              Emotional Assessment Appearance:: Appears stated age Attitude/Demeanor/Rapport: Gracious Affect (typically observed): Pleasant, Quiet Orientation: : Oriented to Place, Oriented to  Time, Oriented to Situation Alcohol / Substance Use: Not Applicable Psych Involvement: No (comment)  Admission diagnosis:  Diverticulitis [K57.92] Sigmoid diverticulitis [K57.32] Chemotherapy-induced neutropenia ([place]) [D70.1, T45.1X5A] Acute diverticulitis [K57.92] Patient Active Problem List   Diagnosis Date Noted   Neutropenia ([place]) 06/14/2022   Lymphocytopenia 06/14/2022   Sigmoid diverticulitis 06/13/2022   Acute diverticulitis 06/13/2022  Pancytopenia ([place]) 06/13/2022   Genetic testing 06/09/2022   Port-A-Cath in place 06/07/2022   Anemia due to antineoplastic chemotherapy 06/07/2022   Elevated liver enzymes 04/20/2022   Right ovarian epithelial cancer ([place])  03/30/2022   Acute renal failure superimposed on stage 3a chronic kidney disease ([place]) 03/17/2022   Hyponatremia 03/17/2022   Vaginal dryness 10/22/2019   Vaginal atrophy 10/22/2019   Stage 3a chronic kidney disease ([place]) 02/09/2019   History of mastectomy 06/23/2018   Dupuytren contracture 02/20/2018   Hand pain 02/13/2018   Need for prophylactic vaccination and inoculation against influenza 02/13/2013   Hypothyroidism 08/04/2012   Osteopenia with high risk of fracture 08/12/2010   History of breast cancer 08/12/2010   Hyperlipidemia 08/12/2010   HTN (hypertension) 08/12/2010   PCP:  [name], DO Pharmacy:   [facility] [address] [place] [address] [place] [address] Phone: [phone] Fax: [place] [address] Phone: [phone] Fax: [phone]     Social Determinants of Health (SDOH) Social History: SDOH Screenings   Food Insecurity: No Food Insecurity (06/13/2022)  Housing: Low Risk  (06/13/2022)  Transportation Needs: No Transportation Needs (06/13/2022)  Utilities: Not At Risk (06/13/2022)  Alcohol Screen: Low Risk  (08/26/2021)  Depression (PHQ2-9): Low Risk  (06/01/2022)  Financial Resource Strain: Low Risk  (04/08/2022)  Physical Activity: Sufficiently Active (08/26/2021)  Social Connections: Moderately Integrated (08/26/2021)  Stress: No Stress Concern Present (08/26/2021)  Tobacco Use: Medium Risk (06/13/2022)   SDOH Interventions:     Readmission Risk Interventions    06/14/2022    2:06 PM  Readmission Risk Prevention Plan  Transportation Screening Complete  PCP or Specialist Appt within 3-5 Days Complete  HRI or [place] Complete  Social Work Consult for [place] Planning/Counseling Complete  Palliative Care Screening Not Applicable  Medication Review Press photographer) Referral to Pharmacy

## 2022-06-14 NOTE — Progress Notes (Signed)
Initial Nutrition Assessment  INTERVENTION:   Anda [employer] 1.4 PO BID, each provides 455 kcals and 20g protein   -Banatrol soluble fiber supplement BID to aid in diarrhea. Only given with loose stools.  -Multivitamin with minerals daily  -Placed "Diarrhea" handout in AVS  NUTRITION DIAGNOSIS:   Increased nutrient needs related to cancer and cancer related treatments as evidenced by estimated needs.  GOAL:   Patient will meet greater than or equal to 90% of their needs  MONITOR:   PO intake, Supplement acceptance, Labs, Weight trends, I & O's  REASON FOR ASSESSMENT:   Consult Other (Comment) (pt request)  ASSESSMENT:   77 year old F with PMH of stage II right ovarian epithelial cancer on chemo, breast cancer s/p mastectomy, CKD-3A, anemia of chronic disease, hypothyroidism, hypertension and hyperlipidemia presenting with diarrhea and weakness and admitted for acute sigmoid diverticulitis and neutropenia/pancytopenia.  Patient consuming ~10% of meals at this time. Drank most of an Ensure supplement but states these cause diarrhea. Pt had to use the bathroom during visit. Pt states she has not had a BM yet today but thinks since she hasn't had much to eat there may not be much left for a BM. Later noted BM was documented as type 4. Pt also tested negative for c.diff.  Reviewed foods to consume to help bulk stool up when having diarrhea. Pt familiar with BRAT diet and was open to trying dairy-free protein shake options. Pt states she can functionally swallow but states she has trouble getting herself to swallow food.   Per weight records, pt has lost 26 lbs since 11/27 (14% wt loss x 2.5 months, significant for time frame).  Medications: KLOR-CON, Phenergan  Labs reviewed: Low Na   NUTRITION - FOCUSED PHYSICAL EXAM:  Unable to complete as pt went to the restroom shortly after visit started. Noticeable depletions in orbital region.  Diet Order:   Diet Order              Diet regular Room service appropriate? Yes; Fluid consistency: Thin  Diet effective now                   EDUCATION NEEDS:   Education needs have been addressed  Skin:  Skin Assessment: Reviewed RN Assessment  Last BM:  2/5 -type 4  Height:   Ht Readings from Last 1 Encounters:  06/13/22 '5\' 7"'$  (1.702 m)    Weight:   Wt Readings from Last 1 Encounters:  06/13/22 69.4 kg    BMI:  Body mass index is 23.96 kg/m.  Estimated Nutritional Needs:   Kcal:  2000-2200  Protein:  100-110g  Fluid:  2L/day   Clayton Bibles, MS, RD, LDN Inpatient Clinical Dietitian Contact information available via Amion

## 2022-06-14 NOTE — Progress Notes (Signed)
PROGRESS NOTE  April Weber QMV:[id number] DOB: [birth year]   PCP: [name], DO  Patient is from: Home.  Lives alone.  Independently ambulates at baseline.  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  Started on IV cefepime and Flagyl.  Oncology consulted.   Patient has not further bowel movement since admission.  Neutropenia and anemia worse.  Lactic acidosis resolved.  Remains on IV cefepime and Flagyl.  Subjective: Seen and examined earlier this morning.  Reports improvement in her pain but feels tender across lower abdomen.  Has not a bowel movement since admission.  Denies nausea or vomiting.  Objective: Vitals:   06/13/22 1143 06/13/22 1452 06/13/22 2158 06/14/22 0628  BP:  (!) 140/72 132/77 124/74  Pulse:  84 88 85  Resp:  '16 18 18  '$ Temp:  99.4 F (37.4 C) 99.8 F (37.7 C) 99.4 F (37.4 C)  TempSrc:  Oral Oral Oral  SpO2:  100% 97% 97%  Weight:      Height: '5\' 7"'$  (1.702 m)       Examination:  GENERAL: No apparent distress.  Nontoxic. HEENT: MMM.  Vision and hearing grossly intact.  NECK: Supple.  No apparent JVD.  RESP:  No IWOB.  Fair aeration bilaterally. CVS:  RRR. Heart sounds normal.  ABD/GI/GU: BS+. Abd soft.  Tender across lower abdomen. MSK/EXT:  Moves extremities. No apparent deformity. No edema.  SKIN: no apparent skin lesion or wound NEURO: Awake, alert and oriented appropriately.  No apparent focal neuro deficit. PSYCH: Calm. Normal affect.   Procedures:  None  Microbiology summarized: None  Assessment and plan: Principal Problem:   Sigmoid  diverticulitis Active Problems:   Hyperlipidemia   HTN (hypertension)   Hypothyroidism   Stage 3a chronic kidney disease (HCC)   Hyponatremia   Right ovarian epithelial cancer ([place])   Anemia due to antineoplastic chemotherapy   Neutropenia (HCC)   Lymphocytopenia  Sigmoid diverticulitis: Patient is neutropenic likely from chemotherapy.  No further diarrhea here.  No perforation or abscess on CT.  Exam with tenderness across lower abdomen.  No rebound. -Continue IV cefepime and Flagyl. -Has been on regular diet since admission.  Will back off if pain worse. -Continue IV fluid -Oxycodone and IV fentanyl as needed for pain control  Leukopenia with neutropenia and lymphocytopenia: Likely due to chemo.  ANC 0.1.  Lymphocyte 0.3 -Neutropenic precaution. -Oncology on board. -Broad-spectrum antibiotics as above   Hyponatremia: Urine sodium mildly elevated suggesting some degree of SIADH. -Continue monitoring -Discontinue IV fluid -Consider sodium tablets a force   Anemia due to antineoplastic chemotherapy: Hgb worse partly due to dilution from IV fluid. Recent Labs    03/25/22 1026 03/31/22 0601 03/31/22 1028 04/20/22 1406 05/17/22 0825 05/27/22 1041 06/01/22 0848 06/07/22 0824 06/13/22 0250 06/14/22 0539  HGB 14.2 10.6* 10.8* 12.7 12.1 10.0* 10.6* 10.6* 10.1* 7.8*  -Discontinue IV fluid -Check anemia panel -Oncology on board  Right epithelial ovarian cancer on chemotherapy.  It seems the last infusion is on 1/29 -Oncology on board  CKD-3A: Stable. Recent Labs    04/26/22 0734 05/17/22 0825 05/27/22 1041 06/01/22 0848 06/07/22 0824 06/13/22 0250 06/13/22 1300 06/13/22 1801  06/14/22 0005 06/14/22 0539  BUN '20 21 13 '$ 7* 27* '19 18 16 16 14  '$ CREATININE 1.51* 1.20* 1.17* 0.90 0.98 1.04* 1.03* 0.94 0.89 0.81  -Continue monitoring  Essential hypertension: Normotensive. -Continue home regimen   HLD -Continue home regimen   Hypothyroidism -Continue home  regimen   Increased nutrient needs Body mass index is 23.96 kg/m. -Consult dietitian          DVT prophylaxis:  rivaroxaban (XARELTO) tablet 10 mg Start: 06/13/22 1000 rivaroxaban (XARELTO) tablet 10 mg  Code Status: Full code Family Communication: None at bedside Level of care: Med-Surg Status is: Inpatient Remains inpatient appropriate because: Due to acute sigmoid diverticulitis and neutropenia   Final disposition: Likely home once medically cleared Consultants:  Oncology  55 minutes with more than 50% spent in reviewing records, counseling patient/family and coordinating care.   Sch Meds:  Scheduled Meds:  potassium chloride  40 mEq Oral Daily   rivaroxaban  10 mg Oral Daily   Continuous Infusions:  ceFEPime (MAXIPIME) IV 2 g (06/14/22 0418)   metronidazole 500 mg (06/14/22 0503)   promethazine (PHENERGAN) injection (IM or IVPB) 12.5 mg (06/14/22 0626)   PRN Meds:.acetaminophen **OR** acetaminophen, fentaNYL (SUBLIMAZE) injection, ondansetron (ZOFRAN) IV, oxyCODONE, promethazine (PHENERGAN) injection (IM or IVPB)  Antimicrobials: Anti-infectives (From admission, onward)    Start     Dose/Rate Route Frequency Ordered Stop   06/13/22 1400  metroNIDAZOLE (FLAGYL) IVPB 500 mg        500 mg 100 mL/hr over 60 Minutes Intravenous Every 8 hours 06/13/22 0453     06/13/22 0500  ceFEPIme (MAXIPIME) 2 g in sodium chloride 0.9 % 100 mL IVPB        2 g 200 mL/hr over 30 Minutes Intravenous Every 12 hours 06/13/22 0445     06/13/22 0430  cefTRIAXone (ROCEPHIN) 1 g in sodium chloride 0.9 % 100 mL IVPB  Status:  Discontinued        1 g 200 mL/hr over 30 Minutes Intravenous  Once 06/13/22 0424 06/13/22 0426   06/13/22 0430  metroNIDAZOLE (FLAGYL) IVPB 500 mg        500 mg 100 mL/hr over 60 Minutes Intravenous  Once 06/13/22 0424 06/13/22 0538        I have personally reviewed the following labs and images: CBC: Recent Labs  Lab 06/13/22 0250 06/14/22 0539  WBC  1.2* 0.6*  NEUTROABS 0.8* 0.1*  HGB 10.1* 7.8*  HCT 28.7* 22.4*  MCV 86.4 89.2  PLT 242 159   BMP &GFR Recent Labs  Lab 06/13/22 0250 06/13/22 1300 06/13/22 1801 06/14/22 0005 06/14/22 0539  NA 127* 133* 131* 127* 128*  K 3.8 3.3* 3.7 3.5 3.5  CL 93* 100 99 101 99  CO2 22 25 21* 20* 21*  GLUCOSE 137* 107* 98 106* 98  BUN '19 18 16 16 14  '$ CREATININE 1.04* 1.03* 0.94 0.89 0.81  CALCIUM 9.3 7.9* 7.9* 7.6* 7.5*  MG  --  1.4*  --   --   --   PHOS  --  4.0 3.6 3.0 2.7   Estimated Creatinine Clearance: 56.6 mL/min (by C-G formula based on SCr of 0.81 mg/dL). Liver & Pancreas: Recent Labs  Lab 06/13/22 0250 06/13/22 1300 06/13/22 1801 06/14/22 0005 06/14/22 0539  AST 22  --   --   --   --   ALT 16  --   --   --   --   ALKPHOS 47  --   --   --   --  BILITOT 0.9  --   --   --   --   PROT 6.6  --   --   --   --   ALBUMIN 3.7 2.8* 2.7* 2.8* 2.6*   Recent Labs  Lab 06/13/22 0250  LIPASE 17   No results for input(s): "AMMONIA" in the last 168 hours. Diabetic: No results for input(s): "HGBA1C" in the last 72 hours. No results for input(s): "GLUCAP" in the last 168 hours. Cardiac Enzymes: No results for input(s): "CKTOTAL", "CKMB", "CKMBINDEX", "TROPONINI" in the last 168 hours. No results for input(s): "PROBNP" in the last 8760 hours. Coagulation Profile: No results for input(s): "INR", "PROTIME" in the last 168 hours. Thyroid Function Tests: No results for input(s): "TSH", "T4TOTAL", "FREET4", "T3FREE", "THYROIDAB" in the last 72 hours. Lipid Profile: No results for input(s): "CHOL", "HDL", "LDLCALC", "TRIG", "CHOLHDL", "LDLDIRECT" in the last 72 hours. Anemia Panel: No results for input(s): "VITAMINB12", "FOLATE", "FERRITIN", "TIBC", "IRON", "RETICCTPCT" in the last 72 hours. Urine analysis:    Component Value Date/Time   COLORURINE YELLOW 06/13/2022 1018   APPEARANCEUR CLEAR 06/13/2022 1018   APPEARANCEUR Clear 01/30/2020 0805   LABSPEC 1.006 06/13/2022 1018    PHURINE 6.0 06/13/2022 1018   GLUCOSEU NEGATIVE 06/13/2022 1018   HGBUR NEGATIVE 06/13/2022 1018   BILIRUBINUR NEGATIVE 06/13/2022 1018   BILIRUBINUR Negative 01/30/2020 0805   KETONESUR NEGATIVE 06/13/2022 1018   PROTEINUR NEGATIVE 06/13/2022 1018   NITRITE NEGATIVE 06/13/2022 1018   LEUKOCYTESUR NEGATIVE 06/13/2022 1018   Sepsis Labs: Invalid input(s): "PROCALCITONIN", "LACTICIDVEN"  Microbiology: No results found for this or any previous visit (from the past 240 hour(s)).  Radiology Studies: No results found.    Jorge Retz T. Clitherall  If 7PM-7AM, please contact night-coverage [email or web address] 06/14/2022, 9:37 AM

## 2022-06-14 NOTE — Progress Notes (Signed)
April Weber   DOB:[birth year]   WC#:[id number]    ASSESSMENT & PLAN:  Ovarian cancer Treatment course is complicated by infection/admission Continue supportive care She is not due for another chemotherapy dose for 2 weeks  Acute diverticulitis She started to feel better today but remains nauseated and weak Continue antibiotic therapy as directed  Acquired pancytopenia Due to recent chemotherapy I recommend G-CSF support daily until [facility] is greater than 1.5 She might need transfusion support tomorrow if she continues to be anemic  Moderate protein calorie malnutrition Due to acute illness Monitor closely Continue IV fluids  Discharge planning She is not ready to go home There is high risk of death due to severe neutropenia I recommend keeping her here until she is able to eat and drink and ANC is at a safe level.  I anticipate she will be here for another 2 to 3 days  Goals of care discussion Resolution of severe neutropenia and able to tolerate oral diet without difficulties  All questions were answered. The patient knows to call the clinic with any problems, questions or concerns.   The total time spent in the appointment was 40 minutes encounter with patients including review of chart and various tests results, discussions about plan of care and coordination of care plan  [name], MD 06/14/2022 9:57 AM  Subjective:  Patient well-known to me.  I was notified of her admission She developed diarrhea and abdominal pain leading to CT imaging and admission  Objective:  Vitals:   06/13/22 2158 06/14/22 0628  BP: 132/77 124/74  Pulse: 88 85  Resp: 18 18  Temp: 99.8 F (37.7 C) 99.4 F (37.4 C)  SpO2: 97% 97%     Intake/Output Summary (Last 24 hours) at 06/14/2022 0957 Last data filed at 06/14/2022 0900 Gross per 24 hour  Intake 1983.76 ml  Output 1550 ml  Net 433.76 ml    GENERAL:alert, no distress and comfortable.  She looks weak and debilitated  NEURO: alert &  oriented x 3 with fluent speech, no focal motor/sensory deficits   Labs:  Recent Labs    06/01/22 0848 06/07/22 0824 06/13/22 0250 06/13/22 1300 06/13/22 1801 06/14/22 0005 06/14/22 0539  NA 134 135 127*   < > 131* 127* 128*  K 4.2 4.0 3.8   < > 3.7 3.5 3.5  CL 97 103 93*   < > 99 101 99  CO2 '22 24 22   '$ < > 21* 20* 21*  GLUCOSE 93 149* 137*   < > 98 106* 98  BUN 7* 27* 19   < > '16 16 14  '$ CREATININE 0.90 0.98 1.04*   < > 0.94 0.89 0.81  CALCIUM 8.8 9.1 9.3   < > 7.9* 7.6* 7.5*  GFRNONAA  --  59* 55*   < > >60 >60 >60  PROT 6.0 7.1 6.6  --   --   --   --   ALBUMIN 3.6* 3.7 3.7   < > 2.7* 2.8* 2.6*  AST '21 15 22  '$ --   --   --   --   ALT '19 16 16  '$ --   --   --   --   ALKPHOS 101 71 47  --   --   --   --   BILITOT 0.2 0.3 0.9  --   --   --   --   BILIDIR 0.12  --   --   --   --   --   --    < > =  values in this interval not displayed.    Studies: I have personally reviewed her CT imaging CT ABDOMEN PELVIS WO CONTRAST  Result Date: 06/13/2022 CLINICAL DATA:  Abdominal pain EXAM: CT ABDOMEN AND PELVIS WITHOUT CONTRAST TECHNIQUE: Multidetector CT imaging of the abdomen and pelvis was performed following the standard protocol without IV contrast. RADIATION DOSE REDUCTION: This exam was performed according to the departmental dose-optimization program which includes automated exposure control, adjustment of the mA and/or kV according to patient size and/or use of iterative reconstruction technique. COMPARISON:  03/16/2022 FINDINGS: Lower chest: No acute abnormality. Hepatobiliary: No focal hepatic abnormality. Gallbladder unremarkable. Pancreas: No focal abnormality or ductal dilatation. Spleen: No focal abnormality.  Normal size. Adrenals/Urinary Tract: Severely atrophic left kidney. No adrenal or right renal mass. No hydronephrosis. Urinary bladder unremarkable. Stomach/Bowel: Scattered colonic diverticulosis. Inflammatory stranding around the mid sigmoid colon compatible with active  diverticulitis. Stomach and small bowel decompressed, unremarkable. Vascular/Lymphatic: Aortic atherosclerosis. No evidence of aneurysm or adenopathy. Reproductive: Prior hysterectomy. No adnexal masses. Previously seen lower pelvic solid and cystic mass no longer visualized. Other: No free fluid or free air. Musculoskeletal: No acute bony abnormality. IMPRESSION: Colonic diverticulosis. Inflammatory stranding around the mid sigmoid colon compatible with active diverticulitis. Interval resection of the previously seen lower pelvic solid and cystic mass and resolution of ascites. Aortic atherosclerosis. Severely atrophic left kidney, stable since prior study. Electronically Signed   By: Rolm Baptise M.D.   On: 06/13/2022 03:39

## 2022-06-15 DIAGNOSIS — E876 Hypokalemia: Secondary | ICD-10-CM

## 2022-06-15 DIAGNOSIS — D709 Neutropenia, unspecified: Secondary | ICD-10-CM

## 2022-06-15 DIAGNOSIS — E039 Hypothyroidism, unspecified: Secondary | ICD-10-CM | POA: Diagnosis not present

## 2022-06-15 DIAGNOSIS — K5732 Diverticulitis of large intestine without perforation or abscess without bleeding: Secondary | ICD-10-CM | POA: Diagnosis not present

## 2022-06-15 DIAGNOSIS — R5081 Fever presenting with conditions classified elsewhere: Secondary | ICD-10-CM

## 2022-06-15 DIAGNOSIS — D701 Agranulocytosis secondary to cancer chemotherapy: Secondary | ICD-10-CM | POA: Diagnosis not present

## 2022-06-15 DIAGNOSIS — N1831 Chronic kidney disease, stage 3a: Secondary | ICD-10-CM | POA: Diagnosis not present

## 2022-06-15 HISTORY — DX: Neutropenia, unspecified: D70.9

## 2022-06-15 LAB — RENAL FUNCTION PANEL
Albumin: 2.4 g/dL — ABNORMAL LOW (ref 3.5–5.0)
Anion gap: 6 (ref 5–15)
BUN: 11 mg/dL (ref 8–23)
CO2: 20 mmol/L — ABNORMAL LOW (ref 22–32)
Calcium: 7.2 mg/dL — ABNORMAL LOW (ref 8.9–10.3)
Chloride: 102 mmol/L (ref 98–111)
Creatinine, Ser: 0.79 mg/dL (ref 0.44–1.00)
GFR, Estimated: >60 mL/min (ref >60)
Glucose, Bld: 113 mg/dL — ABNORMAL HIGH (ref 70–99)
Phosphorus: 1.8 mg/dL — ABNORMAL LOW (ref 2.5–4.6)
Potassium: 3.3 mmol/L — ABNORMAL LOW (ref 3.5–5.1)
Sodium: 128 mmol/L — ABNORMAL LOW (ref 135–145)

## 2022-06-15 LAB — RETICULOCYTES
Immature Retic Fract: 9.6 % (ref 2.3–15.9)
RBC.: 2.51 MIL/uL — ABNORMAL LOW (ref 3.87–5.11)
Retic Ct Pct: <0.4 % — ABNORMAL LOW (ref 0.4–3.1)

## 2022-06-15 LAB — CBC
HCT: 22.8 % — ABNORMAL LOW (ref 36.0–46.0)
Hemoglobin: 7.8 g/dL — ABNORMAL LOW (ref 12.0–15.0)
MCH: 30.8 pg (ref 26.0–34.0)
MCHC: 34.2 g/dL (ref 30.0–36.0)
MCV: 90.1 fL (ref 80.0–100.0)
Platelets: 150 10*3/uL (ref 150–400)
RBC: 2.53 MIL/uL — ABNORMAL LOW (ref 3.87–5.11)
RDW: 13 % (ref 11.5–15.5)
WBC: 0.9 10*3/uL — CL (ref 4.0–10.5)
nRBC: 0 % (ref 0.0–0.2)

## 2022-06-15 LAB — VITAMIN B12: Vitamin B-12: 586 pg/mL (ref 180–914)

## 2022-06-15 LAB — IRON AND TIBC
Iron: 12 ug/dL — ABNORMAL LOW (ref 28–170)
Saturation Ratios: 9 % — ABNORMAL LOW (ref 10.4–31.8)
TIBC: 141 ug/dL — ABNORMAL LOW (ref 250–450)
UIBC: 129 ug/dL

## 2022-06-15 LAB — FERRITIN: Ferritin: 1043 ng/mL — ABNORMAL HIGH (ref 11–307)

## 2022-06-15 LAB — MAGNESIUM: Magnesium: 1.6 mg/dL — ABNORMAL LOW (ref 1.7–2.4)

## 2022-06-15 LAB — FOLATE: Folate: 26.3 ng/mL (ref >5.9)

## 2022-06-15 MED ORDER — ATENOLOL 25 MG PO TABS
12.5000 mg | ORAL_TABLET | Freq: Two times a day (BID) | ORAL | Status: DC
  Administered 2022-06-15 – 2022-06-17 (×5): 12.5 mg via ORAL
  Filled 2022-06-15 (×5): qty 1

## 2022-06-15 MED ORDER — MAGNESIUM SULFATE 2 GM/50ML IV SOLN
2.0000 g | Freq: Once | INTRAVENOUS | Status: AC
  Administered 2022-06-15: 2 g via INTRAVENOUS
  Filled 2022-06-15: qty 50

## 2022-06-15 MED ORDER — LEVOTHYROXINE SODIUM 100 MCG PO TABS
100.0000 ug | ORAL_TABLET | Freq: Every day | ORAL | Status: DC
  Administered 2022-06-15 – 2022-06-17 (×3): 100 ug via ORAL
  Filled 2022-06-15 (×3): qty 1

## 2022-06-15 MED ORDER — LIDOCAINE-PRILOCAINE 2.5-2.5 % EX CREA
TOPICAL_CREAM | CUTANEOUS | Status: DC | PRN
  Administered 2022-06-15: 1 via TOPICAL
  Filled 2022-06-15: qty 5

## 2022-06-15 MED ORDER — LEVOTHYROXINE SODIUM 100 MCG PO TABS: 100.0000 ug | ORAL_TABLET | Freq: Every morning | ORAL | Status: DC

## 2022-06-15 MED ORDER — POTASSIUM PHOSPHATES 15 MMOLE/5ML IV SOLN
30.0000 mmol | Freq: Once | INTRAVENOUS | Status: AC
  Administered 2022-06-15: 30 mmol via INTRAVENOUS
  Filled 2022-06-15: qty 10

## 2022-06-15 MED ORDER — POTASSIUM CHLORIDE IN NACL 20-0.9 MEQ/L-% IV SOLN
INTRAVENOUS | Status: DC
  Filled 2022-06-15 (×6): qty 1000

## 2022-06-15 MED ORDER — POTASSIUM CHLORIDE CRYS ER 20 MEQ PO TBCR
40.0000 meq | EXTENDED_RELEASE_TABLET | Freq: Once | ORAL | Status: AC
  Administered 2022-06-15: 40 meq via ORAL
  Filled 2022-06-15: qty 2

## 2022-06-15 NOTE — Progress Notes (Signed)
April Weber   DOB:06/22/1945   QT#:[id number]    ASSESSMENT & PLAN:   Ovarian cancer Treatment course is complicated by infection/admission Continue supportive care She is not due for another chemotherapy dose for 2 weeks   Acute diverticulitis She remains nauseated and weak Continue antibiotic therapy as directed   Acquired pancytopenia Due to recent chemotherapy I recommend G-CSF support daily until [facility] is greater than 1.5 She might need transfusion support tomorrow if she continues to be anemic   Moderate protein calorie malnutrition Due to acute illness Monitor closely Continue IV fluids   Discharge planning She is not ready to go home There is high risk of death due to severe neutropenia I recommend keeping her here until she is able to eat and drink and ANC is at a safe level.  I anticipate she will be here for another 2 to 3 days   Goals of care discussion Resolution of severe neutropenia and able to tolerate oral diet without difficulties All questions were answered. The patient knows to call the clinic with any problems, questions or concerns.   The total time spent in the appointment was 30 minutes encounter with patients including review of chart and various tests results, discussions about plan of care and coordination of care plan  [name], MD 06/15/2022 12:55 PM  Subjective:  She is feeling weak.  She still have no appetite.  Denies abdominal pain.  She has occasional nausea.  Objective:  Vitals:   06/15/22 0608 06/15/22 1025  BP: 137/68 132/67  Pulse: 96 90  Resp: 18   Temp: 99 F (37.2 C)   SpO2: 98%      Intake/Output Summary (Last 24 hours) at 06/15/2022 1255 Last data filed at 06/15/2022 1208 Gross per 24 hour  Intake 1230 ml  Output 2100 ml  Net -870 ml    GENERAL:alert, no distress and comfortable  NEURO: alert & oriented x 3 with fluent speech, no focal motor/sensory deficits   Labs:  Recent Labs    06/01/22 0848 06/07/22 0824  06/13/22 0250 06/13/22 1300 06/14/22 0005 06/14/22 0539 06/15/22 0530  NA 134 135 127*   < > 127* 128* 128*  K 4.2 4.0 3.8   < > 3.5 3.5 3.3*  CL 97 103 93*   < > 101 99 102  CO2 '22 24 22   '$ < > 20* 21* 20*  GLUCOSE 93 149* 137*   < > 106* 98 113*  BUN 7* 27* 19   < > '16 14 11  '$ CREATININE 0.90 0.98 1.04*   < > 0.89 0.81 0.79  CALCIUM 8.8 9.1 9.3   < > 7.6* 7.5* 7.2*  GFRNONAA  --  59* 55*   < > >60 >60 >60  PROT 6.0 7.1 6.6  --   --   --   --   ALBUMIN 3.6* 3.7 3.7   < > 2.8* 2.6* 2.4*  AST '21 15 22  '$ --   --   --   --   ALT '19 16 16  '$ --   --   --   --   ALKPHOS 101 71 47  --   --   --   --   BILITOT 0.2 0.3 0.9  --   --   --   --   BILIDIR 0.12  --   --   --   --   --   --    < > = values in this interval  not displayed.    Studies:  CT ABDOMEN PELVIS WO CONTRAST  Result Date: 06/13/2022 CLINICAL DATA:  Abdominal pain EXAM: CT ABDOMEN AND PELVIS WITHOUT CONTRAST TECHNIQUE: Multidetector CT imaging of the abdomen and pelvis was performed following the standard protocol without IV contrast. RADIATION DOSE REDUCTION: This exam was performed according to the departmental dose-optimization program which includes automated exposure control, adjustment of the mA and/or kV according to patient size and/or use of iterative reconstruction technique. COMPARISON:  03/16/2022 FINDINGS: Lower chest: No acute abnormality. Hepatobiliary: No focal hepatic abnormality. Gallbladder unremarkable. Pancreas: No focal abnormality or ductal dilatation. Spleen: No focal abnormality.  Normal size. Adrenals/Urinary Tract: Severely atrophic left kidney. No adrenal or right renal mass. No hydronephrosis. Urinary bladder unremarkable. Stomach/Bowel: Scattered colonic diverticulosis. Inflammatory stranding around the mid sigmoid colon compatible with active diverticulitis. Stomach and small bowel decompressed, unremarkable. Vascular/Lymphatic: Aortic atherosclerosis. No evidence of aneurysm or adenopathy. Reproductive:  Prior hysterectomy. No adnexal masses. Previously seen lower pelvic solid and cystic mass no longer visualized. Other: No free fluid or free air. Musculoskeletal: No acute bony abnormality. IMPRESSION: Colonic diverticulosis. Inflammatory stranding around the mid sigmoid colon compatible with active diverticulitis. Interval resection of the previously seen lower pelvic solid and cystic mass and resolution of ascites. Aortic atherosclerosis. Severely atrophic left kidney, stable since prior study. Electronically Signed   By: Rolm Baptise M.D.   On: 06/13/2022 03:39

## 2022-06-15 NOTE — Plan of Care (Signed)
Patient AOX4, VSS throughout shift.  Tmax 101.1 F, PRN tylenol given for relief and recheck Temp 99.9 F.  Provider notified.  Pt c/o muscle spasms relieved by tizanidine.  All meds given on time as ordered. Diminished lungs, IS encouraged.  Pt ambulated with steady gait to bathroom to void.  POC maintained, will continue to monitor.  Problem: Education: Goal: Knowledge of General Education information will improve Description: Including pain rating scale, medication(s)/side effects and non-pharmacologic comfort measures Outcome: Progressing   Problem: Health Behavior/Discharge Planning: Goal: Ability to manage health-related needs will improve Outcome: Progressing   Problem: Clinical Measurements: Goal: Ability to maintain clinical measurements within normal limits will improve Outcome: Progressing Goal: Will remain free from infection Outcome: Progressing Goal: Diagnostic test results will improve Outcome: Progressing Goal: Respiratory complications will improve Outcome: Progressing Goal: Cardiovascular complication will be avoided Outcome: Progressing   Problem: Activity: Goal: Risk for activity intolerance will decrease Outcome: Progressing   Problem: Nutrition: Goal: Adequate nutrition will be maintained Outcome: Progressing   Problem: Coping: Goal: Level of anxiety will decrease Outcome: Progressing   Problem: Elimination: Goal: Will not experience complications related to bowel motility Outcome: Progressing Goal: Will not experience complications related to urinary retention Outcome: Progressing   Problem: Pain Managment: Goal: General experience of comfort will improve Outcome: Progressing   Problem: Safety: Goal: Ability to remain free from injury will improve Outcome: Progressing   Problem: Skin Integrity: Goal: Risk for impaired skin integrity will decrease Outcome: Progressing

## 2022-06-15 NOTE — Progress Notes (Signed)
PROGRESS NOTE  April Weber RDE:[id number] DOB: 1946-04-06   PCP: [name], DO  Patient is from: Home.  Lives alone.  Independently ambulates at baseline.  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  Started on IV cefepime and Flagyl.  Oncology consulted.   CDF and GIP negative.  Lactic acidosis resolved.   Neutropenia and anemia worse.  Started on Granix. Remains on IV cefepime and Flagyl.  Oncology following.  Subjective: Seen and examined earlier this morning.  No major events overnight of this morning.  Feels nauseous.  Abdominal pain improved.  Last bowel movement about 11 AM yesterday.  Tolerating regular diet.  Significant electrolyte derangements.  Still with significant pancytopenia.  Objective: Vitals:   06/14/22 2159 06/15/22 0100 06/15/22 0608 06/15/22 1025  BP: 117/62  137/68 132/67  Pulse: 88  96 90  Resp: 18  18   Temp: (!) 101.1 F (38.4 C) 99.1 F (37.3 C) 99 F (37.2 C)   TempSrc: Oral Oral Oral   SpO2: 94%  98%   Weight:      Height:        Examination:  GENERAL: No apparent distress.  Nontoxic. HEENT: MMM.  Vision and hearing grossly intact.  NECK: Supple.  No apparent JVD.  RESP:  No IWOB.  Fair aeration bilaterally. CVS:  RRR. Heart sounds normal.  ABD/GI/GU: BS+. Abd soft, NTND.  Mild tenderness over LLQ. MSK/EXT:   No apparent deformity. Moves extremities. No edema.  SKIN: no apparent skin lesion or wound NEURO: Awake and alert. Oriented appropriately.  No apparent focal neuro deficit. PSYCH: Calm. Normal affect.   Procedures:   None  Microbiology summarized: C. difficile negative GIP negative  Assessment and plan: Principal Problem:   Sigmoid diverticulitis Active Problems:   Hyperlipidemia   HTN (hypertension)   Hypothyroidism   Stage 3a chronic kidney disease ([place])   Hyponatremia   Right ovarian epithelial cancer ([place])   Anemia due to antineoplastic chemotherapy   Neutropenia ([facility])   Lymphocytopenia   Neutropenic fever ([facility])  Acute sigmoid diverticulitis: Patient with severe neutropenia from chemotherapy.    No perforation or abscess on CT.   Abdominal pain and diarrhea seems to have improved.  No significant tenderness on exam.  Tolerating regular diet -Continue IV cefepime and Flagyl given significant neutropenia. -Continue IV diet. -Continue IV fluid -Oxycodone and IV fentanyl as needed for pain control  Neutropenic fever: Spiked fever to 102.5 about 2:30 AM this morning.  Neutropenia likely due to chemo.  -Appreciate oncology input-started Granix. -Continue broad-spectrum antibiotics -Consider blood culture if she spikes fever again   Hyponatremia: Likely due to poor p.o. intake and dehydration.  Urine sodium slightly elevated suggesting some degree of SIADH as well. -Started NS with KCl at 100 cc an hour -Continue monitoring   Anemia due to antineoplastic chemotherapy: Hgb worse partly due to dilution from IV fluid.  Anemia panel suggests anemia of chronic disease Recent Labs    03/31/22 0601 03/31/22 1028 04/20/22 1406 05/17/22 0825 05/27/22 1041 06/01/22 0848 06/07/22 0824 06/13/22 0250 06/14/22 0539 06/15/22 0530  HGB 10.6* 10.8* 12.7 12.1 10.0* 10.6* 10.6* 10.1* 7.8* 7.8*  -Continue  monitoring -May need blood transfusion.  Right epithelial ovarian cancer on chemotherapy.  It seems the last infusion is on 1/29 -Oncology on board  CKD-3A: Stable. Recent Labs    05/17/22 0825 05/27/22 1041 06/01/22 0848 06/07/22 0824 06/13/22 0250 06/13/22 1300 06/13/22 1801  06/14/22 0005 06/14/22 0539 06/15/22 0530  BUN 21 13 7* 27* '19 18 16 16 14 11  '$ CREATININE 1.20* 1.17* 0.90 0.98 1.04* 1.03* 0.94 0.89 0.81 0.79  -Continue monitoring  Essential hypertension: Normotensive. -Continue home regimen   Hyperlipidemia -Continue home regimen   Hypothyroidism -Resume home Synthroid  Hypokalemia/hypophosphatemia/hypomagnesemia -Monitor replenish as appropriate   Increased nutrient needs Body mass index is 23.96 kg/m. Nutrition Problem: Increased nutrient needs Etiology: cancer and cancer related treatments Signs/Symptoms: estimated needs Interventions: MVI, Education   DVT prophylaxis:  rivaroxaban (XARELTO) tablet 10 mg Start: 06/13/22 1000 rivaroxaban (XARELTO) tablet 10 mg  Code Status: Full code Family Communication: None at bedside Level of care: Med-Surg Status is: Inpatient Remains inpatient appropriate because: Due to acute sigmoid diverticulitis, neutropenic fever and electrolyte derangements   Final disposition: Likely home once medically cleared Consultants:  Oncology  55 minutes with more than 50% spent in reviewing records, counseling patient/family and coordinating care.   Sch Meds:  Scheduled Meds:  atenolol  12.5 mg Oral BID   feeding supplement (KATE FARMS STANDARD 1.4)  325 mL Oral BID BM   fiber supplement (BANATROL TF)  60 mL Per Tube BID   levothyroxine  100 mcg Oral QAC breakfast   multivitamin with minerals  1 tablet Oral Daily   rivaroxaban  10 mg Oral Daily   Tbo-filgastrim (GRANIX) SQ  300 mcg Subcutaneous Daily   Continuous Infusions:  0.9 % NaCl with KCl 20 mEq / L 100 mL/hr at 06/15/22 0834   ceFEPime (MAXIPIME) IV 2 g (06/15/22 0507)   metronidazole 500 mg (06/15/22 0508)   potassium PHOSPHATE IVPB (in mmol) 30 mmol (06/15/22 1031)   promethazine (PHENERGAN) injection (IM or IVPB) 12.5 mg (06/15/22 0657)   PRN Meds:.acetaminophen **OR** acetaminophen, fentaNYL (SUBLIMAZE) injection,  lidocaine-prilocaine, loperamide, ondansetron (ZOFRAN) IV, oxyCODONE, promethazine (PHENERGAN) injection (IM or IVPB)  Antimicrobials: Anti-infectives (From admission, onward)    Start     Dose/Rate Route Frequency Ordered Stop   06/13/22 1400  metroNIDAZOLE (FLAGYL) IVPB 500 mg        500 mg 100 mL/hr over 60 Minutes Intravenous Every 8 hours 06/13/22 0453     06/13/22 0500  ceFEPIme (MAXIPIME) 2 g in sodium chloride 0.9 % 100 mL IVPB        2 g 200 mL/hr over 30 Minutes Intravenous Every 12 hours 06/13/22 0445     06/13/22 0430  cefTRIAXone (ROCEPHIN) 1 g in sodium chloride 0.9 % 100 mL IVPB  Status:  Discontinued        1 g 200 mL/hr over 30 Minutes Intravenous  Once 06/13/22 0424 06/13/22 0426   06/13/22 0430  metroNIDAZOLE (FLAGYL) IVPB 500 mg        500 mg 100 mL/hr over 60 Minutes Intravenous  Once 06/13/22 0424 06/13/22 0538        I have personally reviewed the following labs and images: CBC: Recent Labs  Lab 06/13/22 0250 06/14/22 0539 06/15/22 0530  WBC 1.2* 0.6* 0.9*  NEUTROABS 0.8* 0.1*  --   HGB 10.1* 7.8* 7.8*  HCT 28.7* 22.4* 22.8*  MCV 86.4 89.2 90.1  PLT 242 159 150   BMP &GFR Recent Labs  Lab 06/13/22 1300 06/13/22  1801 06/14/22 0005 06/14/22 0539 06/15/22 0530  NA 133* 131* 127* 128* 128*  K 3.3* 3.7 3.5 3.5 3.3*  CL 100 99 101 99 102  CO2 25 21* 20* 21* 20*  GLUCOSE 107* 98 106* 98 113*  BUN '18 16 16 14 11  '$ CREATININE 1.03* 0.94 0.89 0.81 0.79  CALCIUM 7.9* 7.9* 7.6* 7.5* 7.2*  MG 1.4*  --   --   --  1.6*  PHOS 4.0 3.6 3.0 2.7 1.8*   Estimated Creatinine Clearance: 57.3 mL/min (by C-G formula based on SCr of 0.79 mg/dL). Liver & Pancreas: Recent Labs  Lab 06/13/22 0250 06/13/22 1300 06/13/22 1801 06/14/22 0005 06/14/22 0539 06/15/22 0530  AST 22  --   --   --   --   --   ALT 16  --   --   --   --   --   ALKPHOS 47  --   --   --   --   --   BILITOT 0.9  --   --   --   --   --   PROT 6.6  --   --   --   --   --   ALBUMIN 3.7  2.8* 2.7* 2.8* 2.6* 2.4*   Recent Labs  Lab 06/13/22 0250  LIPASE 17   No results for input(s): "AMMONIA" in the last 168 hours. Diabetic: No results for input(s): "HGBA1C" in the last 72 hours. No results for input(s): "GLUCAP" in the last 168 hours. Cardiac Enzymes: No results for input(s): "CKTOTAL", "CKMB", "CKMBINDEX", "TROPONINI" in the last 168 hours. No results for input(s): "PROBNP" in the last 8760 hours. Coagulation Profile: No results for input(s): "INR", "PROTIME" in the last 168 hours. Thyroid Function Tests: No results for input(s): "TSH", "T4TOTAL", "FREET4", "T3FREE", "THYROIDAB" in the last 72 hours. Lipid Profile: No results for input(s): "CHOL", "HDL", "LDLCALC", "TRIG", "CHOLHDL", "LDLDIRECT" in the last 72 hours. Anemia Panel: Recent Labs    06/15/22 0530  VITAMINB12 586  FOLATE 26.3  FERRITIN 1,043*  TIBC 141*  IRON 12*  RETICCTPCT <0.4*   Urine analysis:    Component Value Date/Time   COLORURINE YELLOW 06/13/2022 1018   APPEARANCEUR CLEAR 06/13/2022 1018   APPEARANCEUR Clear 01/30/2020 0805   LABSPEC 1.006 06/13/2022 1018   PHURINE 6.0 06/13/2022 1018   GLUCOSEU NEGATIVE 06/13/2022 1018   HGBUR NEGATIVE 06/13/2022 1018   BILIRUBINUR NEGATIVE 06/13/2022 1018   BILIRUBINUR Negative 01/30/2020 0805   KETONESUR NEGATIVE 06/13/2022 1018   PROTEINUR NEGATIVE 06/13/2022 1018   NITRITE NEGATIVE 06/13/2022 1018   LEUKOCYTESUR NEGATIVE 06/13/2022 1018   Sepsis Labs: Invalid input(s): "PROCALCITONIN", "LACTICIDVEN"  Microbiology: Recent Results (from the past 240 hour(s))  C Difficile Quick Screen w PCR reflex     Status: None   Collection Time: 06/14/22 11:16 AM   Specimen: STOOL  Result Value Ref Range Status   C Diff antigen NEGATIVE NEGATIVE Final   C Diff toxin NEGATIVE NEGATIVE Final   C Diff interpretation No C. difficile detected.  Final    Comment: Performed at [facility], [place] [address] [place], [place] [address]   Gastrointestinal Panel by PCR , Stool     Status: None   Collection Time: 06/14/22 11:16 AM   Specimen: STOOL  Result Value Ref Range Status   Campylobacter species NOT DETECTED NOT DETECTED Final   Plesimonas shigelloides NOT DETECTED NOT DETECTED Final   Salmonella species NOT DETECTED NOT DETECTED Final  Yersinia enterocolitica NOT DETECTED NOT DETECTED Final   Vibrio species NOT DETECTED NOT DETECTED Final   Vibrio cholerae NOT DETECTED NOT DETECTED Final   Enteroaggregative E coli (EAEC) NOT DETECTED NOT DETECTED Final   Enteropathogenic E coli (EPEC) NOT DETECTED NOT DETECTED Final   Enterotoxigenic E coli (ETEC) NOT DETECTED NOT DETECTED Final   Shiga like toxin producing E coli (STEC) NOT DETECTED NOT DETECTED Final   Shigella/Enteroinvasive E coli (EIEC) NOT DETECTED NOT DETECTED Final   Cryptosporidium NOT DETECTED NOT DETECTED Final   Cyclospora cayetanensis NOT DETECTED NOT DETECTED Final   Entamoeba histolytica NOT DETECTED NOT DETECTED Final   Giardia lamblia NOT DETECTED NOT DETECTED Final   Adenovirus F40/41 NOT DETECTED NOT DETECTED Final   Astrovirus NOT DETECTED NOT DETECTED Final   Norovirus GI/GII NOT DETECTED NOT DETECTED Final   Rotavirus A NOT DETECTED NOT DETECTED Final   Sapovirus (I, II, IV, and V) NOT DETECTED NOT DETECTED Final    Comment: Performed at [facility], [address], [place], [place] [address]    Radiology Studies: No results found.    Brenlynn Fake T. Wells  If 7PM-7AM, please contact night-coverage [email or web address] 06/15/2022, 10:39 AM

## 2022-06-16 ENCOUNTER — Encounter (HOSPITAL_COMMUNITY): Payer: Self-pay | Admitting: Hematology and Oncology

## 2022-06-16 ENCOUNTER — Other Ambulatory Visit: Payer: Self-pay | Admitting: Hematology and Oncology

## 2022-06-16 DIAGNOSIS — E871 Hypo-osmolality and hyponatremia: Secondary | ICD-10-CM | POA: Diagnosis not present

## 2022-06-16 DIAGNOSIS — K5732 Diverticulitis of large intestine without perforation or abscess without bleeding: Secondary | ICD-10-CM | POA: Diagnosis not present

## 2022-06-16 DIAGNOSIS — D6481 Anemia due to antineoplastic chemotherapy: Secondary | ICD-10-CM | POA: Diagnosis not present

## 2022-06-16 DIAGNOSIS — I10 Essential (primary) hypertension: Secondary | ICD-10-CM | POA: Diagnosis not present

## 2022-06-16 LAB — CBC WITH DIFFERENTIAL/PLATELET
Abs Immature Granulocytes: 0.12 10*3/uL — ABNORMAL HIGH (ref 0.00–0.07)
Basophils Absolute: 0 10*3/uL (ref 0.0–0.1)
Basophils Relative: 1 %
Eosinophils Absolute: 0 10*3/uL (ref 0.0–0.5)
Eosinophils Relative: 0 %
HCT: 22.2 % — ABNORMAL LOW (ref 36.0–46.0)
Hemoglobin: 7.6 g/dL — ABNORMAL LOW (ref 12.0–15.0)
Immature Granulocytes: 4 %
Lymphocytes Relative: 19 %
Lymphs Abs: 0.6 10*3/uL — ABNORMAL LOW (ref 0.7–4.0)
MCH: 31.3 pg (ref 26.0–34.0)
MCHC: 34.2 g/dL (ref 30.0–36.0)
MCV: 91.4 fL (ref 80.0–100.0)
Monocytes Absolute: 1.1 10*3/uL — ABNORMAL HIGH (ref 0.1–1.0)
Monocytes Relative: 33 %
Neutro Abs: 1.4 10*3/uL — ABNORMAL LOW (ref 1.7–7.7)
Neutrophils Relative %: 43 %
Platelets: 177 10*3/uL (ref 150–400)
RBC: 2.43 MIL/uL — ABNORMAL LOW (ref 3.87–5.11)
RDW: 13.5 % (ref 11.5–15.5)
WBC: 3.3 10*3/uL — ABNORMAL LOW (ref 4.0–10.5)
nRBC: 0 % (ref 0.0–0.2)

## 2022-06-16 LAB — BASIC METABOLIC PANEL
Anion gap: 7 (ref 5–15)
BUN: 7 mg/dL — ABNORMAL LOW (ref 8–23)
CO2: 18 mmol/L — ABNORMAL LOW (ref 22–32)
Calcium: 6.8 mg/dL — ABNORMAL LOW (ref 8.9–10.3)
Chloride: 104 mmol/L (ref 98–111)
Creatinine, Ser: 0.78 mg/dL (ref 0.44–1.00)
GFR, Estimated: >60 mL/min (ref >60)
Glucose, Bld: 102 mg/dL — ABNORMAL HIGH (ref 70–99)
Potassium: 3.7 mmol/L (ref 3.5–5.1)
Sodium: 129 mmol/L — ABNORMAL LOW (ref 135–145)

## 2022-06-16 LAB — MAGNESIUM: Magnesium: 1.8 mg/dL (ref 1.7–2.4)

## 2022-06-16 LAB — PATHOLOGIST SMEAR REVIEW

## 2022-06-16 LAB — PHOSPHORUS: Phosphorus: 1.4 mg/dL — ABNORMAL LOW (ref 2.5–4.6)

## 2022-06-16 MED ORDER — POTASSIUM PHOSPHATES 15 MMOLE/5ML IV SOLN
30.0000 mmol | Freq: Once | INTRAVENOUS | Status: AC
  Administered 2022-06-16: 30 mmol via INTRAVENOUS
  Filled 2022-06-16: qty 10

## 2022-06-16 NOTE — Progress Notes (Signed)
[facility]                                                                               April Weber, is a 77 y.o. 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  Started on IV cefepime and Flagyl.  Oncology consulted.    CDF and GIP negative.  Lactic acidosis resolved.   Neutropenia and anemia worse.  Started on Granix. Remains on IV cefepime and Flagyl.  Oncology following.     Assessment & Plan    Assessment and Plan:  Acute sigmoid diverticulitis:  - abd pain improving, nausea is better.  No vomiting today.  Advanced diet as tolerated.  Continue with IV antibiotics for another 24 hours.    Acute neutropenic fever:  Improving counts with granix.  Oncology on board and appreciate recommendations.  So far cultures are negative.    Hyponatremia:  Slowing improving.  Recheck in am.  Serum osmo pending.  Urine osmo orderd Urine sodium  ordered.  TSH in am.     Anemia of chronic disease: / anemia from chemotherapy:  Hemoglobin stable around 7.  Transfuse to keep hemoglobin greater than 7.    Hypomagnesemia and hypophosphatemia:  Replaced and recheck in am.   Loose bowel movements:  C diff ruled out.  Probably from diverticulitis.     Hypertension:  Well controlled.    Hyperlipidemia:     Hypothyroidism:  Resume synthroid.    Right epithelial ovarian cancer on chemotherapy.  It seems the last infusion is on 1/29 -Oncology on board     RN Pressure Injury Documentation:    Malnutrition Type:  Nutrition  Problem: Increased nutrient needs Etiology: cancer and cancer related treatments   Malnutrition Characteristics:  Signs/Symptoms: estimated needs   Nutrition Interventions:  Interventions: MVI, Education  Estimated body mass index is 23.96 kg/m as calculated from the following:   Height as of this encounter: '5\' 7"'$  (1.702 m).   Weight as of this encounter: 69.4 kg.  Code Status: full code.  DVT Prophylaxis:  rivaroxaban (XARELTO) tablet 10 mg Start: 06/13/22 1000 rivaroxaban (XARELTO) tablet 10 mg   Level of Care: Level of care: Med-Surg Family Communication: none at bedside.   Disposition Plan:     Remains inpatient appropriate:  IV antibiotics.   Procedures:  None.   Consultants:   Oncology.   Antimicrobials:   Anti-infectives (From admission, onward)    Start     Dose/Rate Route Frequency Ordered Stop   06/13/22 1400  metroNIDAZOLE (FLAGYL) IVPB 500 mg        500 mg 100 mL/hr over 60 Minutes Intravenous Every 8 hours 06/13/22 0453     06/13/22 0500  ceFEPIme (MAXIPIME) 2 g in sodium chloride 0.9 % 100 mL IVPB  2 g 200 mL/hr over 30 Minutes Intravenous Every 12 hours 06/13/22 0445     06/13/22 0430  cefTRIAXone (ROCEPHIN) 1 g in sodium chloride 0.9 % 100 mL IVPB  Status:  Discontinued        1 g 200 mL/hr over 30 Minutes Intravenous  Once 06/13/22 0424 06/13/22 0426   06/13/22 0430  metroNIDAZOLE (FLAGYL) IVPB 500 mg        500 mg 100 mL/hr over 60 Minutes Intravenous  Once 06/13/22 0424 06/13/22 0538        Medications  Scheduled Meds:  atenolol  12.5 mg Oral BID   feeding supplement (KATE FARMS STANDARD 1.4)  325 mL Oral BID BM   fiber supplement (BANATROL TF)  60 mL Per Tube BID   levothyroxine  100 mcg Oral QAC breakfast   multivitamin with minerals  1 tablet Oral Daily   rivaroxaban  10 mg Oral Daily   Tbo-filgastrim (GRANIX) SQ  300 mcg Subcutaneous Daily   Continuous Infusions:  0.9 % NaCl with KCl 20 mEq / L 100 mL/hr at 06/16/22 1015    ceFEPime (MAXIPIME) IV 2 g (06/16/22 0511)   metronidazole 500 mg (06/16/22 0510)   potassium PHOSPHATE IVPB (in mmol)     promethazine (PHENERGAN) injection (IM or IVPB) 12.5 mg (06/15/22 2015)   PRN Meds:.acetaminophen **OR** acetaminophen, fentaNYL (SUBLIMAZE) injection, lidocaine-prilocaine, loperamide, ondansetron (ZOFRAN) IV, oxyCODONE, promethazine (PHENERGAN) injection (IM or IVPB)    Subjective:   Anelisse Jacobson was seen and examined today.  Still nauseated. No vomiting. Abd pain is improving.   Objective:   Vitals:   06/15/22 1025 06/15/22 1302 06/15/22 2047 06/16/22 0640  BP: 132/67 128/70 123/86 124/64  Pulse: 90 86 84 82  Resp:  '18 16 14  '$ Temp:  98.9 F (37.2 C) 98.4 F (36.9 C) 98.5 F (36.9 C)  TempSrc:  Oral Oral Oral  SpO2:  97% 97% 96%  Weight:      Height:        Intake/Output Summary (Last 24 hours) at 06/16/2022 1025 Last data filed at 06/16/2022 5643 Gross per 24 hour  Intake 1584.53 ml  Output 700 ml  Net 884.53 ml   Filed Weights   06/13/22 0645  Weight: 69.4 kg     Exam General: Alert and oriented x 3, NAD Cardiovascular: S1 S2 auscultated, no murmurs, RRR Respiratory: Clear to auscultation bilaterally, no wheezing, rales or rhonchi Gastrointestinal: Soft, tenderness improving. Non distended. Bs+ Ext: no pedal edema bilaterally Neuro: AAOx3,  Skin: No rashes Psych: Normal affect and demeanor, alert and oriented x3    Data Reviewed:  I have personally reviewed following labs and imaging studies   CBC Lab Results  Component Value Date   WBC 0.9 (LL) 06/15/2022   RBC 2.53 (L) 06/15/2022   RBC 2.51 (L) 06/15/2022   HGB 7.8 (L) 06/15/2022   HCT 22.8 (L) 06/15/2022   MCV 90.1 06/15/2022   MCH 30.8 06/15/2022   PLT 150 06/15/2022   MCHC 34.2 06/15/2022   RDW 13.0 06/15/2022   LYMPHSABS 0.3 (L) 06/14/2022   MONOABS 0.1 06/14/2022   EOSABS 0.0 06/14/2022   BASOSABS 0.0 32/95/1884     Last metabolic panel Lab Results   Component Value Date   NA 129 (L) 06/16/2022   K 3.7 06/16/2022   CL 104 06/16/2022   CO2 18 (L) 06/16/2022   BUN 7 (L) 06/16/2022   CREATININE 0.78 06/16/2022   GLUCOSE 102 (H) 06/16/2022   GFRNONAA >60  06/16/2022   GFRAA 61 05/20/2020   CALCIUM 6.8 (L) 06/16/2022   PHOS 1.4 (L) 06/16/2022   PROT 6.6 06/13/2022   ALBUMIN 2.4 (L) 06/15/2022   LABGLOB 2.5 05/13/2021   AGRATIO 1.7 05/13/2021   BILITOT 0.9 06/13/2022   ALKPHOS 47 06/13/2022   AST 22 06/13/2022   ALT 16 06/13/2022   ANIONGAP 7 06/16/2022    CBG (last 3)  No results for input(s): "GLUCAP" in the last 72 hours.    Coagulation Profile: No results for input(s): "INR", "PROTIME" in the last 168 hours.   Radiology Studies: No results found.     Hosie Poisson M.D. [facility] 06/16/2022, 10:25 AM  Available via Epic secure chat 7am-7pm After 7 pm, please refer to night coverage provider listed on amion.

## 2022-06-16 NOTE — Care Management Important Message (Signed)
Important Message  Patient Details IM Letter given. Name: BOYD LITAKER MRN: [id number] Date of Birth: 1945-10-21   Medicare Important Message Given:  Yes     Kerin Salen 06/16/2022, 11:37 AM

## 2022-06-16 NOTE — Plan of Care (Signed)
Patient AOX4, VSS throughout shift. Afebrile this shift.  All meds given on time as ordered. Pt c/o nausea, PRN phenergan given for relief. Diminished lungs, IS encouraged. Pt ambulated with steady gait to bathroom to void. POC maintained, will continue to monitor.   Problem: Education: Goal: Knowledge of General Education information will improve Description: Including pain rating scale, medication(s)/side effects and non-pharmacologic comfort measures Outcome: Progressing   Problem: Health Behavior/Discharge Planning: Goal: Ability to manage health-related needs will improve Outcome: Progressing   Problem: Clinical Measurements: Goal: Ability to maintain clinical measurements within normal limits will improve Outcome: Progressing Goal: Will remain free from infection Outcome: Progressing Goal: Diagnostic test results will improve Outcome: Progressing Goal: Respiratory complications will improve Outcome: Progressing Goal: Cardiovascular complication will be avoided Outcome: Progressing   Problem: Activity: Goal: Risk for activity intolerance will decrease Outcome: Progressing   Problem: Nutrition: Goal: Adequate nutrition will be maintained Outcome: Progressing   Problem: Coping: Goal: Level of anxiety will decrease Outcome: Progressing   Problem: Elimination: Goal: Will not experience complications related to bowel motility Outcome: Progressing Goal: Will not experience complications related to urinary retention Outcome: Progressing   Problem: Pain Managment: Goal: General experience of comfort will improve Outcome: Progressing   Problem: Safety: Goal: Ability to remain free from injury will improve Outcome: Progressing   Problem: Skin Integrity: Goal: Risk for impaired skin integrity will decrease Outcome: Progressing

## 2022-06-16 NOTE — Progress Notes (Signed)
April Weber   DOB:1945/08/12   IW#:[id number]    ASSESSMENT & PLAN:   Ovarian cancer Treatment course is complicated by infection/admission Continue supportive care She is not due for another chemotherapy dose for 2 weeks   Acute diverticulitis Continue antibiotic therapy as directed She continues to have intermittent diarrhea Nausea is improving   acquired pancytopenia Due to recent chemotherapy I recommend G-CSF support daily until [facility] is greater than 1.5 She might need transfusion support tomorrow if she continues to be anemic   Moderate protein calorie malnutrition Due to acute illness Monitor closely She is attempting to eat more today   Discharge planning She is not ready to go home There is high risk of death due to severe neutropenia I recommend keeping her here until she is able to eat and drink and ANC is at a safe level.  I anticipate she will be here for another 2 to 3 days   Goals of care discussion Resolution of severe neutropenia and able to tolerate oral diet without difficulties  All questions were answered. The patient knows to call the clinic with any problems, questions or concerns.   The total time spent in the appointment was 30 minutes encounter with patients including review of chart and various tests results, discussions about plan of care and coordination of care plan  [name], MD 06/16/2022 9:27 AM  Subjective:  She continues to have mild intermittent loose stool Denies fever Her nausea is slightly improved today She is able to tolerate breakfast She is able to walk slowly and denies significant weakness No recent bleeding  Objective:  Vitals:   06/15/22 2047 06/16/22 0640  BP: 123/86 124/64  Pulse: 84 82  Resp: 16 14  Temp: 98.4 F (36.9 C) 98.5 F (36.9 C)  SpO2: 97% 96%     Intake/Output Summary (Last 24 hours) at 06/16/2022 4174 Last data filed at 06/16/2022 0814 Gross per 24 hour  Intake 1584.53 ml  Output 700 ml  Net  884.53 ml    GENERAL:alert, no distress and comfortable  NEURO: alert & oriented x 3 with fluent speech, no focal motor/sensory deficits   Labs:  Recent Labs    06/01/22 0848 06/07/22 0824 06/13/22 0250 06/13/22 1300 06/14/22 0005 06/14/22 0539 06/15/22 0530 06/16/22 0830  NA 134 135 127*   < > 127* 128* 128* 129*  K 4.2 4.0 3.8   < > 3.5 3.5 3.3* 3.7  CL 97 103 93*   < > 101 99 102 104  CO2 '22 24 22   '$ < > 20* 21* 20* 18*  GLUCOSE 93 149* 137*   < > 106* 98 113* 102*  BUN 7* 27* 19   < > '16 14 11 '$ 7*  CREATININE 0.90 0.98 1.04*   < > 0.89 0.81 0.79 0.78  CALCIUM 8.8 9.1 9.3   < > 7.6* 7.5* 7.2* 6.8*  GFRNONAA  --  59* 55*   < > >60 >60 >60 >60  PROT 6.0 7.1 6.6  --   --   --   --   --   ALBUMIN 3.6* 3.7 3.7   < > 2.8* 2.6* 2.4*  --   AST '21 15 22  '$ --   --   --   --   --   ALT '19 16 16  '$ --   --   --   --   --   ALKPHOS 101 71 47  --   --   --   --   --  BILITOT 0.2 0.3 0.9  --   --   --   --   --   BILIDIR 0.12  --   --   --   --   --   --   --    < > = values in this interval not displayed.    Studies:  CT ABDOMEN PELVIS WO CONTRAST  Result Date: 06/13/2022 CLINICAL DATA:  Abdominal pain EXAM: CT ABDOMEN AND PELVIS WITHOUT CONTRAST TECHNIQUE: Multidetector CT imaging of the abdomen and pelvis was performed following the standard protocol without IV contrast. RADIATION DOSE REDUCTION: This exam was performed according to the departmental dose-optimization program which includes automated exposure control, adjustment of the mA and/or kV according to patient size and/or use of iterative reconstruction technique. COMPARISON:  03/16/2022 FINDINGS: Lower chest: No acute abnormality. Hepatobiliary: No focal hepatic abnormality. Gallbladder unremarkable. Pancreas: No focal abnormality or ductal dilatation. Spleen: No focal abnormality.  Normal size. Adrenals/Urinary Tract: Severely atrophic left kidney. No adrenal or right renal mass. No hydronephrosis. Urinary bladder unremarkable.  Stomach/Bowel: Scattered colonic diverticulosis. Inflammatory stranding around the mid sigmoid colon compatible with active diverticulitis. Stomach and small bowel decompressed, unremarkable. Vascular/Lymphatic: Aortic atherosclerosis. No evidence of aneurysm or adenopathy. Reproductive: Prior hysterectomy. No adnexal masses. Previously seen lower pelvic solid and cystic mass no longer visualized. Other: No free fluid or free air. Musculoskeletal: No acute bony abnormality. IMPRESSION: Colonic diverticulosis. Inflammatory stranding around the mid sigmoid colon compatible with active diverticulitis. Interval resection of the previously seen lower pelvic solid and cystic mass and resolution of ascites. Aortic atherosclerosis. Severely atrophic left kidney, stable since prior study. Electronically Signed   By: Rolm Baptise M.D.   On: 06/13/2022 03:39

## 2022-06-16 NOTE — Progress Notes (Signed)
Pharmacy Antibiotic Note  STEELE Weber is a 77 y.o. female admitted on 06/13/2022 with  diverticulitis .  Pharmacy has been consulted for Cefepime dosing.  ID: Acute Diverticulitis ANC 0.8 > 0.1, secondary to chemo, LA 2.8>2. Positive porta-cath.  Afebrile, WBC 0.9, Scr <1  2/4 cefepime>> 2/4 flagyl>>  C.diff + GI panel>>none detected  Plan: Cefepime 2g IV q12 Pharmacy will sign off. Please reconsult for further dosing assitance.     Height: '5\' 7"'$  (170.2 cm) Weight: 69.4 kg (153 lb) IBW/kg (Calculated) : 61.6  Temp (24hrs), Avg:98.6 F (37 C), Min:98.4 F (36.9 C), Max:98.9 F (37.2 C)  Recent Labs  Lab 06/13/22 0250 06/13/22 0436 06/13/22 1300 06/13/22 1801 06/14/22 0005 06/14/22 0539 06/14/22 0751 06/15/22 0530  WBC 1.2*  --   --   --   --  0.6*  --  0.9*  CREATININE 1.04*  --  1.03* 0.94 0.89 0.81  --  0.79  LATICACIDVEN 2.8* 2.0*  --   --   --   --  0.5  --     Estimated Creatinine Clearance: 57.3 mL/min (by C-G formula based on SCr of 0.79 mg/dL).    Allergies  Allergen Reactions   Paclitaxel Anaphylaxis   Emend [Fosaprepitant Dimeglumine] Other (See Comments)    3 minutes into emend infusion patient began having facial flushing and redness to bilateral hands and neck, and light headedness   Codeine Nausea And Vomiting   Nsaids Other (See Comments)    Told to avoid due to kidney function   Penicillins Hives   Vibra-Tab [Doxycycline] Hives   Iodine Rash   Latex Rash    Occasionally gets a localized rash on contact with certain latex products    Tc Kapusta S. Alford Highland, PharmD, BCPS Clinical Staff Pharmacist [email or web address]  Wayland Salinas 06/16/2022 7:23 AM

## 2022-06-17 ENCOUNTER — Telehealth: Payer: Self-pay

## 2022-06-17 ENCOUNTER — Telehealth: Payer: Self-pay | Admitting: Family Medicine

## 2022-06-17 DIAGNOSIS — D709 Neutropenia, unspecified: Secondary | ICD-10-CM | POA: Diagnosis not present

## 2022-06-17 DIAGNOSIS — K5732 Diverticulitis of large intestine without perforation or abscess without bleeding: Secondary | ICD-10-CM | POA: Diagnosis not present

## 2022-06-17 DIAGNOSIS — E871 Hypo-osmolality and hyponatremia: Secondary | ICD-10-CM | POA: Diagnosis not present

## 2022-06-17 DIAGNOSIS — I10 Essential (primary) hypertension: Secondary | ICD-10-CM | POA: Diagnosis not present

## 2022-06-17 LAB — BASIC METABOLIC PANEL
Anion gap: 10 (ref 5–15)
BUN: <5 mg/dL — ABNORMAL LOW (ref 8–23)
CO2: 19 mmol/L — ABNORMAL LOW (ref 22–32)
Calcium: 7.1 mg/dL — ABNORMAL LOW (ref 8.9–10.3)
Chloride: 104 mmol/L (ref 98–111)
Creatinine, Ser: 0.77 mg/dL (ref 0.44–1.00)
GFR, Estimated: >60 mL/min (ref >60)
Glucose, Bld: 80 mg/dL (ref 70–99)
Potassium: 4.4 mmol/L (ref 3.5–5.1)
Sodium: 133 mmol/L — ABNORMAL LOW (ref 135–145)

## 2022-06-17 LAB — CBC WITH DIFFERENTIAL/PLATELET
Abs Immature Granulocytes: 2.09 10*3/uL — ABNORMAL HIGH (ref 0.00–0.07)
Basophils Absolute: 0 10*3/uL (ref 0.0–0.1)
Basophils Relative: 0 %
Eosinophils Absolute: 0 10*3/uL (ref 0.0–0.5)
Eosinophils Relative: 0 %
HCT: 22.9 % — ABNORMAL LOW (ref 36.0–46.0)
Hemoglobin: 7.8 g/dL — ABNORMAL LOW (ref 12.0–15.0)
Immature Granulocytes: 15 %
Lymphocytes Relative: 7 %
Lymphs Abs: 1 10*3/uL (ref 0.7–4.0)
MCH: 31.2 pg (ref 26.0–34.0)
MCHC: 34.1 g/dL (ref 30.0–36.0)
MCV: 91.6 fL (ref 80.0–100.0)
Monocytes Absolute: 2.4 10*3/uL — ABNORMAL HIGH (ref 0.1–1.0)
Monocytes Relative: 17 %
Neutro Abs: 8.4 10*3/uL — ABNORMAL HIGH (ref 1.7–7.7)
Neutrophils Relative %: 61 %
Platelets: 207 10*3/uL (ref 150–400)
RBC: 2.5 MIL/uL — ABNORMAL LOW (ref 3.87–5.11)
RDW: 13.8 % (ref 11.5–15.5)
WBC: 13.9 10*3/uL — ABNORMAL HIGH (ref 4.0–10.5)
nRBC: 0 % (ref 0.0–0.2)

## 2022-06-17 LAB — PHOSPHORUS: Phosphorus: 1.3 mg/dL — ABNORMAL LOW (ref 2.5–4.6)

## 2022-06-17 LAB — MAGNESIUM: Magnesium: 1.5 mg/dL — ABNORMAL LOW (ref 1.7–2.4)

## 2022-06-17 LAB — OSMOLALITY, URINE: Osmolality, Ur: 233 mOsm/kg — ABNORMAL LOW (ref 300–900)

## 2022-06-17 LAB — OSMOLALITY: Osmolality: 272 mOsm/kg — ABNORMAL LOW (ref 275–295)

## 2022-06-17 LAB — SODIUM, URINE, RANDOM: Sodium, Ur: 84 mmol/L

## 2022-06-17 LAB — TSH: TSH: 4.24 u[IU]/mL (ref 0.350–4.500)

## 2022-06-17 MED ORDER — HEPARIN SOD (PORK) LOCK FLUSH 100 UNIT/ML IV SOLN
500.0000 [IU] | Freq: Once | INTRAVENOUS | Status: AC
  Administered 2022-06-17: 500 [IU] via INTRAVENOUS
  Filled 2022-06-17: qty 5

## 2022-06-17 MED ORDER — KATE FARMS STANDARD 1.4 PO LIQD: 325.0000 mL | Freq: Two times a day (BID) | ORAL | 2 refills | Status: DC

## 2022-06-17 MED ORDER — PROMETHAZINE HCL 12.5 MG PO TABS: 12.5000 mg | ORAL_TABLET | Freq: Four times a day (QID) | ORAL | 1 refills | Status: DC | PRN

## 2022-06-17 MED ORDER — CIPROFLOXACIN HCL 500 MG PO TABS: 500.0000 mg | ORAL_TABLET | Freq: Two times a day (BID) | ORAL | 0 refills | Status: AC

## 2022-06-17 MED ORDER — BANATROL TF EN LIQD: 60.0000 mL | Freq: Two times a day (BID) | ENTERAL | 2 refills | Status: DC

## 2022-06-17 MED ORDER — METRONIDAZOLE 500 MG PO TABS: 500.0000 mg | ORAL_TABLET | Freq: Two times a day (BID) | ORAL | 0 refills | Status: AC

## 2022-06-17 MED ORDER — LOPERAMIDE HCL 2 MG PO CAPS: 2.0000 mg | ORAL_CAPSULE | ORAL | 0 refills | Status: DC | PRN

## 2022-06-17 NOTE — Progress Notes (Signed)
Eval complete, documentation pending. No need for PT follow up or DME at DC, doing very well. OK for DC home when medically ready.  Deniece Ree PT DPT PN2

## 2022-06-17 NOTE — Telephone Encounter (Signed)
She just got home from the hospital. She is clarifying how to take Eliquis at home? How may mg's? She has some left over from a previous surgery.  She also wanted you to be aware that she is not picking up the nutritional supplement at the pharmacy.

## 2022-06-17 NOTE — Evaluation (Signed)
Physical Therapy Evaluation Patient Details Name: April Weber MRN: [id number] DOB: 07-27-1945 Today's Date: 06/17/2022  History of Present Illness  77 yo female presenting with diarrhea and weakness. C-diff negative. Admitted with diverticulitis. PMH breast CA, CKD, hx DVT, HLD, HTN, PVD, rotator cuff repair, on chemo  Clinical Impression   Pt received in bed, pleasant and very cooperative. Able to mobilize on a mod(I) to independent level without difficulty. Slightly deconditioned but I think as expected given chemo and having been in the hospital. Left in bed with all needs met, medical team aware of pt status. Do not anticipate any skilled PT needs or DME- OK for DC when medically ready from PT perspective.        Recommendations for follow up therapy are one component of a multi-disciplinary discharge planning process, led by the attending physician.  Recommendations may be updated based on patient status, additional functional criteria and insurance authorization.  Follow Up Recommendations No PT follow up      Assistance Recommended at Discharge None  Patient can return home with the following  Other (comment) (no needs/no barriers)    Equipment Recommendations None recommended by PT  Recommendations for Other Services       Functional Status Assessment Patient has had a recent decline in their functional status and demonstrates the ability to make significant improvements in function in a reasonable and predictable amount of time.     Precautions / Restrictions Precautions Precautions: None Restrictions Weight Bearing Restrictions: No      Mobility  Bed Mobility Overal bed mobility: Independent                  Transfers Overall transfer level: Independent                 General transfer comment: no device/use of IV pole    Ambulation/Gait Ambulation/Gait assistance: Modified independent (Device/Increase time) Gait Distance (Feet): 400  Feet Assistive device: IV Pole Gait Pattern/deviations: WFL(Within Functional Limits) Gait velocity: WNL     General Gait Details: able to maintain gait speed while holding conversation with MD and PT in the hallway, no instability noted  Stairs            Wheelchair Mobility    Modified Rankin (Stroke Patients Only)       Balance Overall balance assessment: Independent                                           Pertinent Vitals/Pain Pain Assessment Pain Assessment: No/denies pain    Home Living Family/patient expects to be discharged to:: Private residence Living Arrangements: Alone Available Help at Discharge: Neighbor;Available PRN/intermittently;Friend(s) Type of Home: House Home Access: Stairs to enter Entrance Stairs-Rails: Left Entrance Stairs-Number of Steps: 3-4   Home Layout: One level Home Equipment: None;Other (comment)      Prior Function Prior Level of Function : Independent/Modified Independent                     Hand Dominance        Extremity/Trunk Assessment        Lower Extremity Assessment Lower Extremity Assessment: Overall WFL for tasks assessed    Cervical / Trunk Assessment Cervical / Trunk Assessment: Normal  Communication   Communication: No difficulties  Cognition Arousal/Alertness: Awake/alert Behavior During Therapy: WFL for tasks assessed/performed Overall Cognitive Status: Within  Functional Limits for tasks assessed                                          General Comments      Exercises     Assessment/Plan    PT Assessment Patient does not need any further PT services  PT Problem List         PT Treatment Interventions      PT Goals (Current goals can be found in the Care Plan section)  Acute Rehab PT Goals Patient Stated Goal: go home today PT Goal Formulation: With patient Time For Goal Achievement: 06/17/22 Potential to Achieve Goals: Good     Frequency       Co-evaluation               AM-PAC PT "6 Clicks" Mobility  Outcome Measure Help needed turning from your back to your side while in a flat bed without using bedrails?: None Help needed moving from lying on your back to sitting on the side of a flat bed without using bedrails?: None Help needed moving to and from a bed to a chair (including a wheelchair)?: None Help needed standing up from a chair using your arms (e.g., wheelchair or bedside chair)?: None Help needed to walk in hospital room?: None Help needed climbing 3-5 steps with a railing? : A Little 6 Click Score: 23    End of Session   Activity Tolerance: Patient tolerated treatment well Patient left: in bed;with call bell/phone within reach Nurse Communication: Mobility status PT Visit Diagnosis: Muscle weakness (generalized) (M62.81)    Time: 3545-6256 PT Time Calculation (min) (ACUTE ONLY): 16 min   Charges:   PT Evaluation $PT Eval Low Complexity: 1 Low         Deniece Ree PT DPT PN2

## 2022-06-17 NOTE — Progress Notes (Signed)
April Weber   DOB:02-Sep-1945   GD#:[id number]    ASSESSMENT & PLAN:   Ovarian cancer Treatment course is complicated by infection/admission Continue supportive care She is not due for another chemotherapy dose for 2 weeks I plan to prescribe dose reduction in the future   Acute diverticulitis Continue antibiotic therapy as directed She continues to have intermittent diarrhea Nausea is improving  Recommend transition to oral antibiotics upon discharge   acquired pancytopenia Due to recent chemotherapy Her white blood cell count is now normal She is not symptomatic and does not need blood transfusion support  Moderate protein calorie malnutrition Due to acute illness Monitor closely She is attempting to eat more today   Discharge planning She felt better She can be discharged I will call her next week to check on her She has outpatient follow-up to see me I will sign off   The total time spent in the appointment was 25 minutes encounter with patients including review of chart and various tests results, discussions about plan of care and coordination of care plan  [name], MD 06/17/2022 10:11 AM  Subjective:  The patient was walking in the hallway when I saw her.  She felt much better She is able to eat She has mild loose stool but much improved  Objective:  Vitals:   06/16/22 2001 06/17/22 0436  BP: 127/70 131/68  Pulse: 81 82  Resp: 18 18  Temp: 98.4 F (36.9 C) 98.1 F (36.7 C)  SpO2: 98% 95%     Intake/Output Summary (Last 24 hours) at 06/17/2022 1011 Last data filed at 06/17/2022 9622 Gross per 24 hour  Intake 1806 ml  Output --  Net 1806 ml    GENERAL:alert, no distress and comfortable  NEURO: alert & oriented x 3 with fluent speech, no focal motor/sensory deficits   Labs:  Recent Labs    06/01/22 0848 06/07/22 0824 06/13/22 0250 06/13/22 1300 06/14/22 0005 06/14/22 0539 06/15/22 0530 06/16/22 0830 06/17/22 0429  NA 134 135 127*   < >  127* 128* 128* 129* 133*  K 4.2 4.0 3.8   < > 3.5 3.5 3.3* 3.7 4.4  CL 97 103 93*   < > 101 99 102 104 104  CO2 '22 24 22   '$ < > 20* 21* 20* 18* 19*  GLUCOSE 93 149* 137*   < > 106* 98 113* 102* 80  BUN 7* 27* 19   < > '16 14 11 '$ 7* <5*  CREATININE 0.90 0.98 1.04*   < > 0.89 0.81 0.79 0.78 0.77  CALCIUM 8.8 9.1 9.3   < > 7.6* 7.5* 7.2* 6.8* 7.1*  GFRNONAA  --  59* 55*   < > >60 >60 >60 >60 >60  PROT 6.0 7.1 6.6  --   --   --   --   --   --   ALBUMIN 3.6* 3.7 3.7   < > 2.8* 2.6* 2.4*  --   --   AST '21 15 22  '$ --   --   --   --   --   --   ALT '19 16 16  '$ --   --   --   --   --   --   ALKPHOS 101 71 47  --   --   --   --   --   --   BILITOT 0.2 0.3 0.9  --   --   --   --   --   --  BILIDIR 0.12  --   --   --   --   --   --   --   --    < > = values in this interval not displayed.    Studies:  CT ABDOMEN PELVIS WO CONTRAST  Result Date: 06/13/2022 CLINICAL DATA:  Abdominal pain EXAM: CT ABDOMEN AND PELVIS WITHOUT CONTRAST TECHNIQUE: Multidetector CT imaging of the abdomen and pelvis was performed following the standard protocol without IV contrast. RADIATION DOSE REDUCTION: This exam was performed according to the departmental dose-optimization program which includes automated exposure control, adjustment of the mA and/or kV according to patient size and/or use of iterative reconstruction technique. COMPARISON:  03/16/2022 FINDINGS: Lower chest: No acute abnormality. Hepatobiliary: No focal hepatic abnormality. Gallbladder unremarkable. Pancreas: No focal abnormality or ductal dilatation. Spleen: No focal abnormality.  Normal size. Adrenals/Urinary Tract: Severely atrophic left kidney. No adrenal or right renal mass. No hydronephrosis. Urinary bladder unremarkable. Stomach/Bowel: Scattered colonic diverticulosis. Inflammatory stranding around the mid sigmoid colon compatible with active diverticulitis. Stomach and small bowel decompressed, unremarkable. Vascular/Lymphatic: Aortic atherosclerosis. No  evidence of aneurysm or adenopathy. Reproductive: Prior hysterectomy. No adnexal masses. Previously seen lower pelvic solid and cystic mass no longer visualized. Other: No free fluid or free air. Musculoskeletal: No acute bony abnormality. IMPRESSION: Colonic diverticulosis. Inflammatory stranding around the mid sigmoid colon compatible with active diverticulitis. Interval resection of the previously seen lower pelvic solid and cystic mass and resolution of ascites. Aortic atherosclerosis. Severely atrophic left kidney, stable since prior study. Electronically Signed   By: Rolm Baptise M.D.   On: 06/13/2022 03:39

## 2022-06-17 NOTE — Telephone Encounter (Signed)
Called back to clarify Eliquis Rx. It was from a previous surgery. Instructed to stop the Eliquis and she appreciated the call. She verbalized understanding.

## 2022-06-17 NOTE — Plan of Care (Signed)
Patient AOX4, VSS throughout shift. Afebrile this shift.  All meds given on time as ordered. Pt c/o nausea, PRN phenergan given for relief. Diminished lungs, IS encouraged. Pt ambulated with steady gait to bathroom to void. All morning labs drawn from port.  POC maintained, will continue to monitor.   Problem: Education: Goal: Knowledge of General Education information will improve Description: Including pain rating scale, medication(s)/side effects and non-pharmacologic comfort measures Outcome: Progressing   Problem: Health Behavior/Discharge Planning: Goal: Ability to manage health-related needs will improve Outcome: Progressing   Problem: Clinical Measurements: Goal: Ability to maintain clinical measurements within normal limits will improve Outcome: Progressing Goal: Will remain free from infection Outcome: Progressing Goal: Diagnostic test results will improve Outcome: Progressing Goal: Respiratory complications will improve Outcome: Progressing Goal: Cardiovascular complication will be avoided Outcome: Progressing   Problem: Activity: Goal: Risk for activity intolerance will decrease Outcome: Progressing   Problem: Nutrition: Goal: Adequate nutrition will be maintained Outcome: Progressing   Problem: Coping: Goal: Level of anxiety will decrease Outcome: Progressing   Problem: Elimination: Goal: Will not experience complications related to bowel motility Outcome: Progressing Goal: Will not experience complications related to urinary retention Outcome: Progressing   Problem: Pain Managment: Goal: General experience of comfort will improve Outcome: Progressing   Problem: Safety: Goal: Ability to remain free from injury will improve Outcome: Progressing   Problem: Skin Integrity: Goal: Risk for impaired skin integrity will decrease Outcome: Progressing

## 2022-06-17 NOTE — Telephone Encounter (Signed)
Was the eliquis prescribed after surgery? If from surgery, she can stop

## 2022-06-18 ENCOUNTER — Telehealth: Payer: Self-pay

## 2022-06-18 NOTE — Patient Outreach (Signed)
  Care Coordination TOC Note Transition Care Management Follow-up Telephone Call Date of discharge and from where: 06/17/22-[facility]   Dx: "chemotherapy induced neutropenia, diverticulitis" How have you been since you were released from the hospital? Patient states she is doing good. She rested well last night. She ate small meal-still does not have a big appetite. Denies any nausea or GI sxs. She was having diarrhea while in the hospital and took some Imodium yesterday and voices it has resolved.  Any questions or concerns? Yes-Patient wanting to make sure she was supposed to stop taking Norvasc. Reviewed d/c medication list and instructions with patient and confirmed with patient she is not to be taking med.  Items Reviewed: Did the pt receive and understand the discharge instructions provided? Yes  Medications obtained and verified? Yes  Other? Yes  Any new allergies since your discharge? No  Dietary orders reviewed? Yes Do you have support at home?  Lives alone-able to manage care-has friends/family if needed  Home Care and Equipment/Supplies: Were home health services ordered? not applicable If so, what is the name of the agency? N/A  Has the agency set up a time to come to the patient's home? not applicable Were any new equipment or medical supplies ordered?  No What is the name of the medical supply agency? N/A Were you able to get the supplies/equipment? not applicable Do you have any questions related to the use of the equipment or supplies? No  Functional Questionnaire: (I = Independent and D = Dependent) ADLs: I  Bathing/Dressing- I  Meal Prep- I  Eating- I  Maintaining continence- I  Transferring/Ambulation- I  Managing Meds- I  Follow up appointments reviewed:  PCP Hospital f/u appt confirmed? No   Specialist Hospital f/u appt confirmed? Yes  Scheduled to see Dr. Alvy Bimler on 06/28/22 @ 8:40 am. Are transportation arrangements needed? No  If their  condition worsens, is the pt aware to call PCP or go to the Emergency Dept.? Yes Was the patient provided with contact information for the PCP's office or ED? Yes Was to pt encouraged to call back with questions or concerns? Yes  SDOH assessments and interventions completed:   Yes SDOH Interventions Today    Flowsheet Row Most Recent Value  SDOH Interventions   Food Insecurity Interventions Intervention Not Indicated  Transportation Interventions Intervention Not Indicated       Care Coordination Interventions:  TOC Interventions Today    Flowsheet Row Most Recent Value  TOC Interventions   TOC Interventions Discussed/Reviewed TOC Interventions Discussed       Interventions Today    Flowsheet Row Most Recent Value  Education Interventions   Education Provided Provided Verbal Education  Provided Verbal Education On Nutrition, Medication, When to see the doctor  Nutrition Interventions   Nutrition Discussed/Reviewed Nutrition Discussed         Encounter Outcome:  Pt. Visit Completed    Enzo Montgomery, RN,BSN,CCM [place] [facility] Telephonic Care Management Coordinator Direct Phone: [phone] Toll Free: [phone] Fax: [phone]

## 2022-06-18 NOTE — Discharge Summary (Signed)
Physician Discharge Summary   Patient: April Weber MRN: [id number] DOB: 10-09-1945  Admit date:     06/13/2022  Discharge date: 06/17/2022  Discharge Physician: Hosie Poisson   PCP: [name], DO   Recommendations at discharge:  Please follow up with PCP in one week.  Please follow up with General surgery as needed.  Please follow up with oncology as recommended.   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  Started on IV cefepime and Flagyl.  Oncology consulted.    CDF and GIP negative.  Lactic acidosis resolved.   Neutropenia and anemia worse.  Started on Granix. Remains on IV cefepime and Flagyl.  Oncology following.  Assessment and Plan: Acute sigmoid diverticulitis:  - abd pain improving, nausea is better.  No vomiting today.  Advanced diet as tolerated.  Transition to oral antibiotics to complete a course of 14 days.       Acute neutropenic fever:  Improving counts with granix.  Oncology on board and appreciate recommendations.  So far cultures are negative.      Hyponatremia:  Much improved with IV fluids.  She remains asymptomatic.  Check BMP in one week to check the sodium levels. Discussed with the patient.        Anemia of chronic disease: / anemia from chemotherapy:  Hemoglobin stable around 7.  Transfuse to keep hemoglobin  greater than 7.      Hypomagnesemia and hypophosphatemia:  Replaced and recheck in am.    Loose bowel movements:  C diff ruled out.  Probably from diverticulitis.       Hypertension:  Well controlled.      Hyperlipidemia:        Hypothyroidism:  Resume synthroid.      Right epithelial ovarian cancer on chemotherapy.  It seems the last infusion is on 1/29 -Oncology on board       RN Pressure Injury Documentation:   Malnutrition Type:   Nutrition Problem: Increased nutrient needs Etiology: cancer and cancer related treatments          Consultants: oncology.  Procedures performed: none.   Disposition: Home Diet recommendation:  Discharge Diet Orders (From admission, onward)     Start     Ordered   06/17/22 0000  Diet - low sodium heart healthy        06/17/22 1023           Regular diet DISCHARGE MEDICATION: Allergies as of 06/17/2022       Reactions   Paclitaxel Anaphylaxis   Emend [fosaprepitant Dimeglumine] Other (See Comments)   3 minutes into emend infusion patient began having facial flushing and redness to bilateral hands and neck, and light headedness   Codeine Nausea And Vomiting   Nsaids Other (See Comments)   Told to avoid due to kidney function   Penicillins Hives   Vibra-tab [doxycycline] Hives   Iodine Rash   Latex Rash   Occasionally gets a  localized rash on contact with certain latex products        Medication List     STOP taking these medications    amLODipine 5 MG tablet Commonly known as: NORVASC   prochlorperazine 10 MG tablet Commonly known as: COMPAZINE       TAKE these medications    acetaminophen 650 MG CR tablet Commonly known as: TYLENOL Take 1,300 mg by mouth as needed for pain.   aspirin EC 81 MG tablet Take 81 mg by mouth at bedtime. Swallow whole.   atenolol 25 MG tablet Commonly known as: TENORMIN Take 0.5 tablets (12.5 mg total) by mouth 2 (two) times daily.   CALCIUM + VITAMIN D3  PO Take 1 tablet by mouth 2 (two) times daily.   ciprofloxacin 500 MG tablet Commonly known as: Cipro Take 1 tablet (500 mg total) by mouth 2 (two) times daily for 10 days.   dexamethasone 4 MG tablet Commonly known as: DECADRON Take 2 tabs at the day before and 1 tab daily for 2 days after chemotherapy What changed:  how much to take how to take this when to take this additional instructions   EPINEPHrine 0.3 mg/0.3 mL Soaj injection Commonly known as: EPI-PEN Inject 0.3 mg into the muscle as needed for anaphylaxis.   feeding supplement (KATE FARMS STANDARD 1.4) Liqd liquid Take 325 mLs by mouth 2 (two) times daily between meals.   fiber supplement (BANATROL TF) liquid Place 60 mLs into feeding tube 2 (two) times daily.   levothyroxine 100 MCG tablet Commonly known as: SYNTHROID Take 1 tablet (100 mcg total) by mouth in the morning.   lidocaine-prilocaine cream Commonly known as: EMLA Apply to affected area once What changed:  how much to take how to take this when to take this reasons to take this additional instructions   loperamide 2 MG capsule Commonly known as: IMODIUM Take 1 capsule (2 mg total) by mouth as needed for diarrhea or loose stools.   loratadine 10 MG tablet Commonly known as: CLARITIN Take 10 mg by mouth daily as needed for allergies.   metroNIDAZOLE 500 MG tablet Commonly known as: Flagyl Take 1 tablet (500 mg total) by mouth 2 (two) times daily for 10 days.   multivitamin tablet Take 1 tablet by mouth in the morning.   ondansetron 8 MG tablet Commonly known as: Zofran Take 1 tablet (8 mg total) by mouth every 8 (eight) hours as needed for nausea or vomiting. Start on the third day after chemotherapy. What changed:  when to take this additional instructions   promethazine 12.5 MG tablet Commonly known as: PHENERGAN Take 1 tablet (12.5 mg total) by mouth every 6 (six) hours as needed for nausea or vomiting.   simvastatin 20 MG  tablet Commonly known as: ZOCOR Take 1 tablet (20 mg total) by mouth at bedtime.        Discharge Exam: Filed Weights   06/13/22 0645  Weight: 69.4 kg   General exam: Appears calm and comfortable  Respiratory system: Clear to auscultation. Respiratory effort normal. Cardiovascular system: S1 & S2 heard, RRR. No JVD, murmurs, rubs, gallops or clicks. No pedal edema. Gastrointestinal system: Abdomen is nondistended, soft and nontender. No organomegaly or masses felt. Normal bowel sounds heard. Central nervous system: Alert and oriented. No focal neurological deficits. Extremities: Symmetric 5 x 5 power. Skin: No rashes, lesions or ulcers Psychiatry: Judgement and insight appear normal. Mood & affect appropriate.    Condition at discharge: fair  The  results of significant diagnostics from this hospitalization (including imaging, microbiology, ancillary and laboratory) are listed below for reference.   Imaging Studies: CT ABDOMEN PELVIS WO CONTRAST  Result Date: 06/13/2022 CLINICAL DATA:  Abdominal pain EXAM: CT ABDOMEN AND PELVIS WITHOUT CONTRAST TECHNIQUE: Multidetector CT imaging of the abdomen and pelvis was performed following the standard protocol without IV contrast. RADIATION DOSE REDUCTION: This exam was performed according to the departmental dose-optimization program which includes automated exposure control, adjustment of the mA and/or kV according to patient size and/or use of iterative reconstruction technique. COMPARISON:  03/16/2022 FINDINGS: Lower chest: No acute abnormality. Hepatobiliary: No focal hepatic abnormality. Gallbladder unremarkable. Pancreas: No focal abnormality or ductal dilatation. Spleen: No focal abnormality.  Normal size. Adrenals/Urinary Tract: Severely atrophic left kidney. No adrenal or right renal mass. No hydronephrosis. Urinary bladder unremarkable. Stomach/Bowel: Scattered colonic diverticulosis. Inflammatory stranding around the mid sigmoid colon  compatible with active diverticulitis. Stomach and small bowel decompressed, unremarkable. Vascular/Lymphatic: Aortic atherosclerosis. No evidence of aneurysm or adenopathy. Reproductive: Prior hysterectomy. No adnexal masses. Previously seen lower pelvic solid and cystic mass no longer visualized. Other: No free fluid or free air. Musculoskeletal: No acute bony abnormality. IMPRESSION: Colonic diverticulosis. Inflammatory stranding around the mid sigmoid colon compatible with active diverticulitis. Interval resection of the previously seen lower pelvic solid and cystic mass and resolution of ascites. Aortic atherosclerosis. Severely atrophic left kidney, stable since prior study. Electronically Signed   By: Rolm Baptise M.D.   On: 06/13/2022 03:39    Microbiology: Results for orders placed or performed during the hospital encounter of 06/13/22  C Difficile Quick Screen w PCR reflex     Status: None   Collection Time: 06/14/22 11:16 AM   Specimen: STOOL  Result Value Ref Range Status   C Diff antigen NEGATIVE NEGATIVE Final   C Diff toxin NEGATIVE NEGATIVE Final   C Diff interpretation No C. difficile detected.  Final    Comment: Performed at [facility], [place] [address], [place], [place] [address]  Gastrointestinal Panel by PCR , Stool     Status: None   Collection Time: 06/14/22 11:16 AM   Specimen: STOOL  Result Value Ref Range Status   Campylobacter species NOT DETECTED NOT DETECTED Final   Plesimonas shigelloides NOT DETECTED NOT DETECTED Final   Salmonella species NOT DETECTED NOT DETECTED Final   Yersinia enterocolitica NOT DETECTED NOT DETECTED Final   Vibrio species NOT DETECTED NOT DETECTED Final   Vibrio cholerae NOT DETECTED NOT DETECTED Final   Enteroaggregative E coli (EAEC) NOT DETECTED NOT DETECTED Final   Enteropathogenic E coli (EPEC) NOT DETECTED NOT DETECTED Final   Enterotoxigenic E coli (ETEC) NOT DETECTED NOT DETECTED Final   Shiga like toxin  producing E coli (STEC) NOT DETECTED NOT DETECTED Final   Shigella/Enteroinvasive E coli (EIEC) NOT DETECTED NOT DETECTED Final   Cryptosporidium NOT DETECTED NOT DETECTED Final   Cyclospora cayetanensis NOT DETECTED NOT DETECTED Final   Entamoeba histolytica NOT DETECTED NOT DETECTED Final   Giardia lamblia NOT DETECTED NOT DETECTED Final   Adenovirus F40/41 NOT DETECTED NOT DETECTED Final   Astrovirus NOT DETECTED NOT DETECTED Final   Norovirus GI/GII NOT DETECTED NOT DETECTED Final   Rotavirus A NOT DETECTED NOT DETECTED Final   Sapovirus (I, II, IV, and V) NOT DETECTED NOT DETECTED Final    Comment: Performed at [facility], [address] [place],  [address]    Labs: CBC: Recent Labs  Lab 06/13/22 0250  06/14/22 0539 06/15/22 0530 06/16/22 1100 06/17/22 0429  WBC 1.2* 0.6* 0.9* 3.3* 13.9*  NEUTROABS 0.8* 0.1*  --  1.4* 8.4*  HGB 10.1* 7.8* 7.8* 7.6* 7.8*  HCT 28.7* 22.4* 22.8* 22.2* 22.9*  MCV 86.4 89.2 90.1 91.4 91.6  PLT 242 159 150 177 [id number]   Basic Metabolic Panel: Recent Labs  Lab 06/13/22 1300 06/13/22 1801 06/14/22 0005 06/14/22 0539 06/15/22 0530 06/16/22 0830 06/17/22 0429  NA 133*   < > 127* 128* 128* 129* 133*  K 3.3*   < > 3.5 3.5 3.3* 3.7 4.4  CL 100   < > 101 99 102 104 104  CO2 25   < > 20* 21* 20* 18* 19*  GLUCOSE 107*   < > 106* 98 113* 102* 80  BUN 18   < > 16 14 11 $ 7* <5*  CREATININE 1.03*   < > 0.89 0.81 0.79 0.78 0.77  CALCIUM 7.9*   < > 7.6* 7.5* 7.2* 6.8* 7.1*  MG 1.4*  --   --   --  1.6* 1.8 1.5*  PHOS 4.0   < > 3.0 2.7 1.8* 1.4* 1.3*   < > = values in this interval not displayed.   Liver Function Tests: Recent Labs  Lab 06/13/22 0250 06/13/22 1300 06/13/22 1801 06/14/22 0005 06/14/22 0539 06/15/22 0530  AST 22  --   --   --   --   --   ALT 16  --   --   --   --   --   ALKPHOS 47  --   --   --   --   --   BILITOT 0.9  --   --   --   --   --   PROT 6.6  --   --   --   --   --   ALBUMIN 3.7 2.8* 2.7* 2.8*  2.6* 2.4*   CBG: No results for input(s): "GLUCAP" in the last 168 hours.  Discharge time spent: 39 minutes.   Signed: Hosie Poisson, MD [facility]

## 2022-06-22 ENCOUNTER — Telehealth: Payer: Self-pay | Admitting: Genetic Counselor

## 2022-06-22 NOTE — Telephone Encounter (Signed)
HRD testing--revealed positive Genomic Instability Status (GIS) of 62 (greater than 42 confers positive status).  No somatic BRCA1/2 mutations detected.  Told patient that Dr. Alvy Bimler can discuss management implications.

## 2022-06-22 NOTE — Progress Notes (Signed)
Positive genomic instability score (GIS) of 62 through [facility].  No pathogenic variants detected in BRCA1/2.  Report date is June 10, 2022.   [facility] CDx includes sequencing and large rearrangement analysis of BRCA1/2 and genomic instability status through loss of heterozygosity (LOH), telomeric allelic imbalance (TAI) and large-scale state transitions (LST).     April Weber was notified that Dr. Alvy Bimler can discuss how this result impacts her management  .

## 2022-06-22 NOTE — Telephone Encounter (Signed)
SHE did request a GI referral to [facility] back in November (11/6, see phone note) due to uncontrolled reflux.  Sounds like they are just getting to her.

## 2022-06-23 NOTE — Telephone Encounter (Signed)
Attempted to call pt leave vm for cb

## 2022-06-24 ENCOUNTER — Telehealth: Payer: Self-pay

## 2022-06-24 NOTE — Telephone Encounter (Signed)
-----   Message from [name], MD sent at 06/24/2022  7:54 AM EST ----- When she was DC from the hospital she was weak and said she might want to delay her chemo Can you call her and ask if that is her wishes or keep her appt as is?

## 2022-06-24 NOTE — Telephone Encounter (Signed)
Called and ask if she would like to keep appt as scheduled. She would like to keep appts as scheduled.

## 2022-06-25 MED FILL — Dexamethasone Sodium Phosphate Inj 100 MG/10ML: INTRAMUSCULAR | Qty: 1 | Status: AC

## 2022-06-28 ENCOUNTER — Inpatient Hospital Stay: Payer: Medicare Other

## 2022-06-28 ENCOUNTER — Encounter: Payer: Self-pay | Admitting: Hematology and Oncology

## 2022-06-28 ENCOUNTER — Other Ambulatory Visit: Payer: Self-pay

## 2022-06-28 ENCOUNTER — Inpatient Hospital Stay: Payer: Medicare Other | Admitting: Nutrition

## 2022-06-28 ENCOUNTER — Inpatient Hospital Stay: Payer: Medicare Other | Attending: Gynecologic Oncology | Admitting: Hematology and Oncology

## 2022-06-28 VITALS — BP 162/88 | HR 82 | Temp 97.4°F | Resp 18 | Ht 67.0 in | Wt 153.0 lb

## 2022-06-28 DIAGNOSIS — C561 Malignant neoplasm of right ovary: Secondary | ICD-10-CM | POA: Insufficient documentation

## 2022-06-28 DIAGNOSIS — D6481 Anemia due to antineoplastic chemotherapy: Secondary | ICD-10-CM | POA: Diagnosis not present

## 2022-06-28 DIAGNOSIS — Z79899 Other long term (current) drug therapy: Secondary | ICD-10-CM | POA: Diagnosis not present

## 2022-06-28 DIAGNOSIS — N1831 Chronic kidney disease, stage 3a: Secondary | ICD-10-CM | POA: Diagnosis not present

## 2022-06-28 DIAGNOSIS — Z95828 Presence of other vascular implants and grafts: Secondary | ICD-10-CM

## 2022-06-28 DIAGNOSIS — Z5111 Encounter for antineoplastic chemotherapy: Secondary | ICD-10-CM | POA: Insufficient documentation

## 2022-06-28 DIAGNOSIS — I10 Essential (primary) hypertension: Secondary | ICD-10-CM | POA: Diagnosis not present

## 2022-06-28 DIAGNOSIS — T451X5A Adverse effect of antineoplastic and immunosuppressive drugs, initial encounter: Secondary | ICD-10-CM

## 2022-06-28 LAB — CBC WITH DIFFERENTIAL (CANCER CENTER ONLY)
Abs Immature Granulocytes: 0.06 10*3/uL (ref 0.00–0.07)
Basophils Absolute: 0 10*3/uL (ref 0.0–0.1)
Basophils Relative: 0 %
Eosinophils Absolute: 0 10*3/uL (ref 0.0–0.5)
Eosinophils Relative: 1 %
HCT: 29.9 % — ABNORMAL LOW (ref 36.0–46.0)
Hemoglobin: 10.3 g/dL — ABNORMAL LOW (ref 12.0–15.0)
Immature Granulocytes: 1 %
Lymphocytes Relative: 5 %
Lymphs Abs: 0.4 10*3/uL — ABNORMAL LOW (ref 0.7–4.0)
MCH: 31.5 pg (ref 26.0–34.0)
MCHC: 34.4 g/dL (ref 30.0–36.0)
MCV: 91.4 fL (ref 80.0–100.0)
Monocytes Absolute: 0.3 10*3/uL (ref 0.1–1.0)
Monocytes Relative: 3 %
Neutro Abs: 7.6 10*3/uL (ref 1.7–7.7)
Neutrophils Relative %: 90 %
Platelet Count: 230 10*3/uL (ref 150–400)
RBC: 3.27 MIL/uL — ABNORMAL LOW (ref 3.87–5.11)
RDW: 16.9 % — ABNORMAL HIGH (ref 11.5–15.5)
WBC Count: 8.3 10*3/uL (ref 4.0–10.5)
nRBC: 0 % (ref 0.0–0.2)

## 2022-06-28 LAB — CMP (CANCER CENTER ONLY)
ALT: 14 U/L (ref 0–44)
AST: 17 U/L (ref 15–41)
Albumin: 3.6 g/dL (ref 3.5–5.0)
Alkaline Phosphatase: 44 U/L (ref 38–126)
Anion gap: 9 (ref 5–15)
BUN: 19 mg/dL (ref 8–23)
CO2: 24 mmol/L (ref 22–32)
Calcium: 8.8 mg/dL — ABNORMAL LOW (ref 8.9–10.3)
Chloride: 101 mmol/L (ref 98–111)
Creatinine: 0.95 mg/dL (ref 0.44–1.00)
GFR, Estimated: >60 mL/min (ref >60)
Glucose, Bld: 174 mg/dL — ABNORMAL HIGH (ref 70–99)
Potassium: 4 mmol/L (ref 3.5–5.1)
Sodium: 134 mmol/L — ABNORMAL LOW (ref 135–145)
Total Bilirubin: 0.4 mg/dL (ref 0.3–1.2)
Total Protein: 6.4 g/dL — ABNORMAL LOW (ref 6.5–8.1)

## 2022-06-28 MED ORDER — SODIUM CHLORIDE 0.9% FLUSH
10.0000 mL | INTRAVENOUS | Status: DC | PRN
  Administered 2022-06-28: 10 mL

## 2022-06-28 MED ORDER — HEPARIN SOD (PORK) LOCK FLUSH 100 UNIT/ML IV SOLN
500.0000 [IU] | Freq: Once | INTRAVENOUS | Status: AC | PRN
  Administered 2022-06-28: 500 [IU]

## 2022-06-28 MED ORDER — SODIUM CHLORIDE 0.9 % IV SOLN: Freq: Once | INTRAVENOUS | Status: AC

## 2022-06-28 MED ORDER — SODIUM CHLORIDE 0.9 % IV SOLN
10.0000 mg | Freq: Once | INTRAVENOUS | Status: AC
  Administered 2022-06-28: 10 mg via INTRAVENOUS
  Filled 2022-06-28: qty 10

## 2022-06-28 MED ORDER — CETIRIZINE HCL 10 MG/ML IV SOLN
10.0000 mg | Freq: Once | INTRAVENOUS | Status: AC
  Administered 2022-06-28: 10 mg via INTRAVENOUS
  Filled 2022-06-28: qty 1

## 2022-06-28 MED ORDER — SODIUM CHLORIDE 0.9 % IV SOLN
383.0000 mg | Freq: Once | INTRAVENOUS | Status: AC
  Administered 2022-06-28: 400 mg via INTRAVENOUS
  Filled 2022-06-28: qty 40

## 2022-06-28 MED ORDER — SODIUM CHLORIDE 0.9 % IV SOLN
37.5000 mg/m2 | Freq: Once | INTRAVENOUS | Status: AC
  Administered 2022-06-28: 68 mg via INTRAVENOUS
  Filled 2022-06-28: qty 6.8

## 2022-06-28 MED ORDER — PALONOSETRON HCL INJECTION 0.25 MG/5ML
0.2500 mg | Freq: Once | INTRAVENOUS | Status: AC
  Administered 2022-06-28: 0.25 mg via INTRAVENOUS
  Filled 2022-06-28: qty 5

## 2022-06-28 MED ORDER — SODIUM CHLORIDE 0.9% FLUSH
10.0000 mL | Freq: Once | INTRAVENOUS | Status: AC
  Administered 2022-06-28: 10 mL

## 2022-06-28 MED ORDER — FAMOTIDINE IN NACL 20-0.9 MG/50ML-% IV SOLN
20.0000 mg | Freq: Once | INTRAVENOUS | Status: AC
  Administered 2022-06-28: 20 mg via INTRAVENOUS
  Filled 2022-06-28: qty 50

## 2022-06-28 NOTE — Assessment & Plan Note (Signed)
This is improved since recent admission Observe closely

## 2022-06-28 NOTE — Assessment & Plan Note (Signed)
She is recovered from recent hospitalization Due to recent side effects of treatment, I plan dose reduction of both carboplatin and Taxotere We will proceed with treatment without delay

## 2022-06-28 NOTE — Progress Notes (Signed)
77 year old female diagnosed with Ovarian cancer and followed by Dr. Alvy Bimler.  She is receiving Carboplatin and Taxol.  She is receiving a dose reduction of both carboplatin and Taxotere today.  PMH includes Breast cancer ,CKD, HLD, HTN, PVD.  Medications include Calcium and Vitamin D, Decadron, synthroid, MVI, Zofran.  Labs include Magnesium 1.5 and Phos 1.3 on Feb 8. Na 124 and Glucose 174 on Feb 19.  Height: 67 inches. Weight: 153 pounds. UBW: 160 pounds Jan 2024. BMI: 23.96. ECOG: 1.  Patient seen as a hospital follow up and was positive for risk of malnutrition on the MST report.  Patient noted to have diarrhea during hospitalization and was on several antibiotics.  Reports she feels much better now.  Her diarrhea has improved.  She is trying to eat a healthy diet.  She notes that she disliked [employer] 1.4 oral nutrition supplement during hospitalization.  Reports nausea is controlled on Phenergan.  She endorses weight loss but is not concerned presently due to increased oral intake.  Nutrition diagnosis: Unintended weight loss related to cancer and associated treatments as evidenced by 7 pound weight loss in less than 2 months.  This is a 4 % loss over 2 months which is concerning but not significant.  Intervention: Patient educated on importance of small, frequent meals and snacks with adequate calories and protein to minimize weight loss throughout treatment. Brief education provided on nausea.  Encourage medications as needed. Discussed possible addition of Carnation breakfast essentials with milk if patient feels she needs some additional calories and protein and or if she continues to have weight loss. Nutrition facts sheets provided.  Questions were answered.  Contact information given.  Monitoring, evaluation, goals: Patient will tolerate adequate calories and protein to minimize further weight loss.  Next visit: To be scheduled as needed.  Patient has contact information  for questions.  **Disclaimer: This note was dictated with voice recognition software. Similar sounding words can inadvertently be transcribed and this note may contain transcription errors which may not have been corrected upon publication of note.**

## 2022-06-28 NOTE — Patient Instructions (Signed)
[facility]  Discharge Instructions: Thank you for choosing [facility] to provide your oncology and hematology care.   If you have a lab appointment with the [facility], please go directly to the [facility] and check in at the registration area.   Wear comfortable clothing and clothing appropriate for easy access to any Portacath or PICC line.   We strive to give you quality time with your provider. You may need to reschedule your appointment if you arrive late (15 or more minutes).  Arriving late affects you and other patients whose appointments are after yours.  Also, if you miss three or more appointments without notifying the office, you may be dismissed from the clinic at the provider's discretion.      For prescription refill requests, have your pharmacy contact our office and allow 72 hours for refills to be completed.    Today you received the following chemotherapy and/or immunotherapy agents Docetaxol/Carboplatin.      To help prevent nausea and vomiting after your treatment, we encourage you to take your nausea medication as directed.  BELOW ARE SYMPTOMS THAT SHOULD BE REPORTED IMMEDIATELY: *FEVER GREATER THAN 100.4 F (38 C) OR HIGHER *CHILLS OR SWEATING *NAUSEA AND VOMITING THAT IS NOT CONTROLLED WITH YOUR NAUSEA MEDICATION *UNUSUAL SHORTNESS OF BREATH *UNUSUAL BRUISING OR BLEEDING *URINARY PROBLEMS (pain or burning when urinating, or frequent urination) *BOWEL PROBLEMS (unusual diarrhea, constipation, pain near the anus) TENDERNESS IN MOUTH AND THROAT WITH OR WITHOUT PRESENCE OF ULCERS (sore throat, sores in mouth, or a toothache) UNUSUAL RASH, SWELLING OR PAIN  UNUSUAL VAGINAL DISCHARGE OR ITCHING   Items with * indicate a potential emergency and should be followed up as soon as possible or go to the Emergency Department if any problems should occur.  Please show the CHEMOTHERAPY ALERT CARD or IMMUNOTHERAPY ALERT  CARD at check-in to the Emergency Department and triage nurse.  Should you have questions after your visit or need to cancel or reschedule your appointment, please contact [facility]  Dept: [phone]  and follow the prompts.  Office hours are 8:00 a.m. to 4:30 p.m. Monday - Friday. Please note that voicemails left after 4:00 p.m. may not be returned until the following business day.  We are closed weekends and major holidays. You have access to a nurse at all times for urgent questions. Please call the main number to the clinic Dept: [phone] and follow the prompts.   For any non-urgent questions, you may also contact your provider using MyChart. We now offer e-Visits for anyone 38 and older to request care online for non-urgent symptoms. For details visit [email or web address].   Also download the MyChart app! Go to the app store, search "MyChart", open the app, select [facility], and log in with your MyChart username and password.

## 2022-06-28 NOTE — Progress Notes (Signed)
[place] OFFICE PROGRESS NOTE  Patient Care Team: [name], DO as PCP - General (Family Medicine) Vania Rea, MD as Attending Physician (Obstetrics and Gynecology) Linus Mako, MD as Consulting Physician (Family Medicine) Celestia Khat, OD (Optometry)  ASSESSMENT & PLAN:  Right ovarian epithelial cancer [facility]) She is recovered from recent hospitalization Due to recent side effects of treatment, I plan dose reduction of both carboplatin and Taxotere We will proceed with treatment without delay  Anemia due to antineoplastic chemotherapy This is improved since recent admission Observe closely  Stage 3a chronic kidney disease ([place]) She is able to hydrate She will try her best to hydrate at home  HTN (hypertension) Blood pressure is elevated, could be due to whitecoat hypertension She is instructed to resume taking atenolol  No orders of the defined types were placed in this encounter.   All questions were answered. The patient knows to call the clinic with any problems, questions or concerns. The total time spent in the appointment was 30 minutes encounter with patients including review of chart and various tests results, discussions about plan of care and coordination of care plan   [name], MD 06/28/2022 9:42 AM  INTERVAL HISTORY: Please see below for problem oriented charting. she returns for treatment follow-up seen after recent discharge from hospital She is getting better and improving Appetite is stable Her bowel habits are improving although she still have intermittent loose stool  REVIEW OF SYSTEMS:   Constitutional: Denies fevers, chills or abnormal weight loss Eyes: Denies blurriness of vision Ears, nose, mouth, throat, and face: Denies mucositis or sore throat Respiratory: Denies cough, dyspnea or wheezes Cardiovascular: Denies palpitation, chest discomfort or lower extremity swelling Gastrointestinal:  Denies nausea,  heartburn or change in bowel habits Skin: Denies abnormal skin rashes Lymphatics: Denies new lymphadenopathy or easy bruising Neurological:Denies numbness, tingling or new weaknesses Behavioral/Psych: Mood is stable, no new changes  All other systems were reviewed with the patient and are negative.  I have reviewed the past medical history, past surgical history, social history and family history with the patient and they are unchanged from previous note.  ALLERGIES:  is allergic to paclitaxel, emend [fosaprepitant dimeglumine], codeine, nsaids, penicillins, vibra-tab [doxycycline], iodine, and latex.  MEDICATIONS:  Current Outpatient Medications  Medication Sig Dispense Refill   acetaminophen (TYLENOL) 650 MG CR tablet Take 1,300 mg by mouth as needed for pain.     aspirin EC 81 MG tablet Take 81 mg by mouth at bedtime. Swallow whole.     atenolol (TENORMIN) 25 MG tablet Take 0.5 tablets (12.5 mg total) by mouth 2 (two) times daily. 90 tablet 3   Calcium Carb-Cholecalciferol (CALCIUM + VITAMIN D3 PO) Take 1 tablet by mouth 2 (two) times daily.     dexamethasone (DECADRON) 4 MG tablet Take 2 tabs at the day before and 1 tab daily for 2 days after chemotherapy (Patient taking differently: Take 4-8 mg by mouth See admin instructions. Take 8 mg the day before chemo and 4 mg daily for 2 days after chemotherapy) 36 tablet 6   EPINEPHrine 0.3 mg/0.3 mL IJ SOAJ injection Inject 0.3 mg into the muscle as needed for anaphylaxis. 2 each 2   levothyroxine (SYNTHROID) 100 MCG tablet Take 1 tablet (100 mcg total) by mouth in the morning. 90 tablet 3   lidocaine-prilocaine (EMLA) cream Apply to affected area once (Patient taking differently: Apply 1 Application topically as needed (port access).) 30 g 3   loratadine (CLARITIN) 10  MG tablet Take 10 mg by mouth daily as needed for allergies.     Multiple Vitamin (MULTIVITAMIN) tablet Take 1 tablet by mouth in the morning.     ondansetron (ZOFRAN) 8 MG tablet  Take 1 tablet (8 mg total) by mouth every 8 (eight) hours as needed for nausea or vomiting. Start on the third day after chemotherapy. (Patient taking differently: Take 8 mg by mouth as needed for nausea or vomiting.) 30 tablet 1   simvastatin (ZOCOR) 20 MG tablet Take 1 tablet (20 mg total) by mouth at bedtime. 90 tablet 3   No current facility-administered medications for this visit.   Facility-Administered Medications Ordered in Other Visits  Medication Dose Route Frequency Provider Last Rate Last Admin   0.9 %  sodium chloride infusion   Intravenous Once Alvy Bimler, Anael Rosch, MD       CARBOplatin (PARAPLATIN) 400 mg in sodium chloride 0.9 % 250 mL chemo infusion  400 mg Intravenous Once Alvy Bimler, Jaylin Roundy, MD       cetirizine (QUZYTTIR) injection 10 mg  10 mg Intravenous Once Alvy Bimler, Bryson Gavia, MD       dexamethasone (DECADRON) 10 mg in sodium chloride 0.9 % 50 mL IVPB  10 mg Intravenous Once Alvy Bimler, Candra Wegner, MD       DOCEtaxel (TAXOTERE) 68 mg in sodium chloride 0.9 % 150 mL chemo infusion  37.5 mg/m2 (Treatment Plan Recorded) Intravenous Once Alvy Bimler, Orson Rho, MD       famotidine (PEPCID) IVPB 20 mg premix  20 mg Intravenous Once Alvy Bimler, Rayman Petrosian, MD       heparin lock flush 100 unit/mL  500 Units Intracatheter Once PRN Alvy Bimler, Natali Lavallee, MD       palonosetron (ALOXI) injection 0.25 mg  0.25 mg Intravenous Once Everet Flagg, MD       sodium chloride flush (NS) 0.9 % injection 10 mL  10 mL Intracatheter PRN Alvy Bimler, Hasnain Manheim, MD        SUMMARY OF ONCOLOGIC HISTORY: Oncology History Overview Note  High grade papillary serous 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   Right ovarian epithelial cancer ([place])  03/16/2022 Imaging   1. Large, mixed solid and cystic mass in the low pelvis, difficult to clearly delineate from adjacent ascites although at least 10.2 x 10.0 cm. This is highly concerning for primary ovarian malignancy and most likely arises from the right ovary, although may involve both ovaries. This may be further  evaluated by contrast enhanced MRI of the pelvis if desired. 2. Large volume ascites throughout the abdomen and pelvis, presumed malignant. Stranding and nodularity of the omentum and peritoneum, particularly in the ventral abdomen and left upper quadrant, highly suspicious for peritoneal metastatic disease. 3. Cirrhotic morphology of the liver, which may contribute to ascites. 4. Severely atrophic left kidney, consistent with remote prior infectious, obstructive, or ischemic insult. 5. Coronary artery disease.   Aortic Atherosclerosis (ICD10-I70.0).   03/17/2022 Imaging   1. There is a large solid and cystic mass within the right adnexa which measures up to 9.5 cm in maximum dimension. Findings are concerning for a primary ovarian neoplasm. 2. Large volume of ascites within the abdomen and pelvis. Given the presence of peritoneal nodules on the CT from the prior day findings are concerning for malignant ascites.   03/19/2022 Procedure   CT-guided biopsy of omental mass.    03/19/2022 Procedure   Successful ultrasound-guided paracentesis yielding 4.2 liters of peritoneal fluid.   03/19/2022 Procedure   Successful placement of a left internal jugular approach  power injectable Port-A-Cath. The catheter is ready for immediate use.   03/30/2022 Initial Diagnosis   Ovarian cancer ([place])   03/30/2022 Pathology Results   A. RIGHT OVARY AND FALLOPIAN TUBE, SALPINGO OOPHORECTOMY: Poorly differentiated carcinoma immunohistochemically most consistent with a high grade serous carcinoma Tumor measures 10.7 x 10.6 x 4.6 cm Tumor involves ovarian and mesosalpinx serosal surfaces and right fallopian tube pT2a pN n/a  Note: The tumor consists of sheets and cords of high-grade nuclei with areas of necrosis and adjacent prominent stroma suggesting papillary cores the overall morphology is slightly unusual and not classic; therefore, a battery of immunohistochemical stains were performed to confirm the  diagnosis and exclude a high-grade germ cell tumor.  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 Additionally there is nonspecific focal PLAP positivity; CD117 positivity and D2-40 positivity of uncertain clinical significance.  The tumor is negative for AFP, hCG, CD30, CD31, CK20, glypican-3, and OCT 3/4.  Dr. Saralyn Pilar has peer reviewed the case and agrees with diagnosis. Dr. Alric Seton has also initiated the workup of the case and agrees with the interpretation.  B. OMENTUM, RESECTION: Benign omentum Negative for tumor  C. RIGHT PELVIC SIDEWALL, BIOPSY: Benign mesothelium, fibrovascular stroma and adipose Negative for tumor  D. ANTERIOR CUL DE SAC, BIOPSY: Benign mesothelium Negative for tumor  E. LEFT PELVIC SIDEWALL, BIOPSY: Benign mesothelium and adipose Negative for tumor   ONCOLOGY TABLE:  OVARY or FALLOPIAN TUBE or PRIMARY PERITONEUM: Resection  Procedure: Right salpingo-oophorectomy Specimen Integrity: Grossly focally disrupted Tumor Site: Right ovary Tumor Size: 10.7 x 10.6 x 4.6 cm Histologic Type: Poorly differentiated carcinoma most consistent with a high-grade serous carcinoma Histologic Grade: High-grade Ovarian Surface Involvement: Surface involvement Fallopian Tube Surface Involvement: Mesosalpinx serosal surface involvement and right fallopian tube involvement Implants (required for advanced stage serous/seromucinous borderline tumors only): Not identified Other Tissue/ Organ Involvement: N/A Largest Extrapelvic Peritoneal Focus: N/A Peritoneal/Ascitic Fluid Involvement: N/A Chemotherapy Response Score (CRS): N/A, no known presurgical therapy Regional Lymph Nodes: N/A, no lymph nodes submitted Distant Metastasis:      Distant Site(s) Involved: N/A Pathologic Stage Classification (pTNM, AJCC 8th Edition): pT2a, pN N/A Ancillary Studies: Can be performed  on physician request Representative Tumor Block: A8 Comment(s): [None]    03/30/2022 Surgery   Preop Diagnosis: Pelvic mass, malignant ascites   Postoperative Diagnosis: at least stage IIB ovarian cancer, suspected high grade serous by frozen section   Surgery: Diagnostic laparoscopy, right salpingo-oophorectomy, peritoneal biopsies, infra-colic omentectomy    Surgeon:  Valarie Cones MD    Operative findings: On exam under anesthesia, somewhat mobile mass appreciated in the cul-de-sac to the right.  On agnostic laparoscopy, normal upper abdominal findings other than moderate volume ascites.  Normal-appearing peritoneum, omentum, small bowel.  Right adnexa enlarged with a mass measuring approximately 8-10 cm.  No carcinomatosis appreciated.  Given this, decision made to convert to an open procedure for tumor debulking.  Approximately 5 L of yellow-tinged ascites was removed upon intra-abdominal entry.  Normal upper abdominal survey including smooth diaphragm and liver, stomach, and omentum.  The small bowel was run from the cecum to the ligament of [place] with no abnormalities or carcinomatosis noted.  Right ovary replaced by an approximately 8 cm smooth mass, dilated and enlarged fallopian tube.  The mass itself was adherent to the right pelvic sidewall, sigmoid colon, and densely adherent to the anterior cul-de-sac/bladder peritoneum.  Frozen section consistent  with invasive carcinoma, likely high-grade.  Left tube and ovary were surgically absent.  Sigmoid colon with adhesions to the left pelvic sidewall, left aspect of the bladder and the left vaginal cuff.  Somewhat redundant sigmoid which looped over on itself secondary to these adhesions.  Numerous diverticula noted of the sigmoid colon as well as the transverse colon.  The end of surgery, an R0 resection was accomplished.     04/13/2022 Cancer Staging   Staging form: Ovary, Fallopian Tube, and Primary Peritoneal Carcinoma, AJCC 8th Edition -  Pathologic stage from 04/13/2022: Stage II (pT2, pN0, cM0) - Signed by [name], MD on 04/13/2022 Stage prefix: Initial diagnosis   04/26/2022 -  Chemotherapy   Patient is on Treatment Plan : OVARIAN Carboplatin (AUC 6) + Paclitaxel (175) q21d X 6 Cycles     05/17/2022 Tumor Marker   Patient's tumor was tested for the following markers: CA-125. Results of the tumor marker test revealed 12.7.   06/06/2022 Genetic Testing   Germline: Negative [facility] Multi-Cancer +RNA Panel.  Report date is 06/06/2022.   The Multi-Cancer + RNA Panel offered by [facility] includes sequencing and/or deletion/duplication analysis of the following 70 genes:  AIP*, ALK, APC*, ATM*, AXIN2*, BAP1*, BARD1*, BLM*, BMPR1A*, BRCA1*, BRCA2*, BRIP1*, CDC73*, CDH1*, CDK4, CDKN1B*, CDKN2A, CHEK2*, CTNNA1*, DICER1*, EPCAM (del/dup only), EGFR, FH*, FLCN*, GREM1 (promoter dup only), HOXB13, KIT, LZTR1, MAX*, MBD4, MEN1*, MET, MITF, MLH1*, MSH2*, MSH3*, MSH6*, MUTYH*, NF1*, NF2*, NTHL1*, PALB2*, PDGFRA, PMS2*, POLD1*, POLE*, POT1*, PRKAR1A*, PTCH1*, PTEN*, RAD51C*, RAD51D*, RB1*, RET, SDHA* (sequencing only), SDHAF2*, SDHB*, SDHC*, SDHD*, SMAD4*, SMARCA4*, SMARCB1*, SMARCE1*, STK11*, SUFU*, TMEM127*, TP53*, TSC1*, TSC2*, VHL*. RNA analysis is performed for * genes.  Somatic: Positive genomic instability score (GIS) of 62 through [facility].  No pathogenic variants detected in BRCA1/2.  Report date is June 10, 2022.   [facility] CDx includes sequencing and large rearrangement analysis of BRCA1/2 and genomic instability status through loss of heterozygosity (LOH), telomeric allelic imbalance (TAI) and large-scale state transitions (LST).     06/08/2022 Tumor Marker   Patient's tumor was tested for the following markers: Ca-125. Results of the tumor marker test revealed 16.5.   06/13/2022 - 06/17/2022 Hospital Admission   The patient was admitted to the hospital and found to have diarrhea, diverticulitis and pancytopenia    06/13/2022 Imaging   Colonic diverticulosis. Inflammatory stranding around the mid sigmoid colon compatible with active diverticulitis.   Interval resection of the previously seen lower pelvic solid and cystic mass and resolution of ascites.   Aortic atherosclerosis.   Severely atrophic left kidney, stable since prior study.     PHYSICAL EXAMINATION: ECOG PERFORMANCE STATUS: 1 - Symptomatic but completely ambulatory  Vitals:   06/28/22 0841  BP: (!) 162/88  Pulse: 82  Resp: 18  Temp: (!) 97.4 F (36.3 C)  SpO2: 100%   Filed Weights   06/28/22 0841  Weight: 153 lb (69.4 kg)    GENERAL:alert, no distress and comfortable  NEURO: alert & oriented x 3 with fluent speech, no focal motor/sensory deficits  LABORATORY DATA:  I have reviewed the data as listed    Component Value Date/Time   NA 134 (L) 06/28/2022 0815   NA 134 06/01/2022 0848   K 4.0 06/28/2022 0815   CL 101 06/28/2022 0815   CO2 24 06/28/2022 0815   GLUCOSE 174 (H) 06/28/2022 0815   BUN 19 06/28/2022 0815   BUN 7 (L) 06/01/2022 0848   CREATININE 0.95 06/28/2022 0815  CREATININE 0.99 02/05/2013 0757   CALCIUM 8.8 (L) 06/28/2022 0815   PROT 6.4 (L) 06/28/2022 0815   PROT 6.0 06/01/2022 0848   ALBUMIN 3.6 06/28/2022 0815   ALBUMIN 3.6 (L) 06/01/2022 0848   AST 17 06/28/2022 0815   ALT 14 06/28/2022 0815   ALKPHOS 44 06/28/2022 0815   BILITOT 0.4 06/28/2022 0815   GFRNONAA >60 06/28/2022 0815   GFRNONAA 59 (L) 02/05/2013 0757   GFRAA 61 05/20/2020 0850   GFRAA 68 02/05/2013 0757    No results found for: "SPEP", "UPEP"  Lab Results  Component Value Date   WBC 8.3 06/28/2022   NEUTROABS 7.6 06/28/2022   HGB 10.3 (L) 06/28/2022   HCT 29.9 (L) 06/28/2022   MCV 91.4 06/28/2022   PLT 230 06/28/2022      Chemistry      Component Value Date/Time   NA 134 (L) 06/28/2022 0815   NA 134 06/01/2022 0848   K 4.0 06/28/2022 0815   CL 101 06/28/2022 0815   CO2 24 06/28/2022 0815   BUN 19 06/28/2022  0815   BUN 7 (L) 06/01/2022 0848   CREATININE 0.95 06/28/2022 0815   CREATININE 0.99 02/05/2013 0757      Component Value Date/Time   CALCIUM 8.8 (L) 06/28/2022 0815   ALKPHOS 44 06/28/2022 0815   AST 17 06/28/2022 0815   ALT 14 06/28/2022 0815   BILITOT 0.4 06/28/2022 0815       RADIOGRAPHIC STUDIES: I have personally reviewed the radiological images as listed and agreed with the findings in the report. CT ABDOMEN PELVIS WO CONTRAST  Result Date: 06/13/2022 CLINICAL DATA:  Abdominal pain EXAM: CT ABDOMEN AND PELVIS WITHOUT CONTRAST TECHNIQUE: Multidetector CT imaging of the abdomen and pelvis was performed following the standard protocol without IV contrast. RADIATION DOSE REDUCTION: This exam was performed according to the departmental dose-optimization program which includes automated exposure control, adjustment of the mA and/or kV according to patient size and/or use of iterative reconstruction technique. COMPARISON:  03/16/2022 FINDINGS: Lower chest: No acute abnormality. Hepatobiliary: No focal hepatic abnormality. Gallbladder unremarkable. Pancreas: No focal abnormality or ductal dilatation. Spleen: No focal abnormality.  Normal size. Adrenals/Urinary Tract: Severely atrophic left kidney. No adrenal or right renal mass. No hydronephrosis. Urinary bladder unremarkable. Stomach/Bowel: Scattered colonic diverticulosis. Inflammatory stranding around the mid sigmoid colon compatible with active diverticulitis. Stomach and small bowel decompressed, unremarkable. Vascular/Lymphatic: Aortic atherosclerosis. No evidence of aneurysm or adenopathy. Reproductive: Prior hysterectomy. No adnexal masses. Previously seen lower pelvic solid and cystic mass no longer visualized. Other: No free fluid or free air. Musculoskeletal: No acute bony abnormality. IMPRESSION: Colonic diverticulosis. Inflammatory stranding around the mid sigmoid colon compatible with active diverticulitis. Interval resection of the  previously seen lower pelvic solid and cystic mass and resolution of ascites. Aortic atherosclerosis. Severely atrophic left kidney, stable since prior study. Electronically Signed   By: Rolm Baptise M.D.   On: 06/13/2022 03:39

## 2022-06-28 NOTE — Assessment & Plan Note (Signed)
Blood pressure is elevated, could be due to whitecoat hypertension She is instructed to resume taking atenolol

## 2022-06-28 NOTE — Assessment & Plan Note (Signed)
She is able to hydrate She will try her best to hydrate at home

## 2022-06-29 NOTE — Telephone Encounter (Signed)
Patient aware and verbalized understanding. States she does not want this at this time because what all she is going through. FYI

## 2022-06-30 LAB — CA 125: Cancer Antigen (CA) 125: 15 U/mL (ref 0.0–38.1)

## 2022-07-08 ENCOUNTER — Ambulatory Visit: Payer: Medicare Other | Admitting: Nutrition

## 2022-07-08 NOTE — Progress Notes (Signed)
Patient contacted RD regarding questions on what she can eat after treatment.  She is concerned because she states she is still losing some weight.  She reports her nausea is getting under control.  She experiences increased nausea due to smells.  She is very particular about certain foods and what she likes to eat or tolerates.  She has questions whether she can eat out.  She also reports decreased energy and does not have enough energy to cook.  Educated patient on importance of food safety when eating out.  Encouraged her to be sure foods were thoroughly cooked and advised against eating at salad bars.  Encourage patient to find foods at the grocery store that she can eat at room temperature or cold.  Encouraged her to prepare foods ahead of time when she has energy.  Reviewed other strategies for decreasing odors/smells.  Provided support and encouragement.

## 2022-07-16 MED FILL — Dexamethasone Sodium Phosphate Inj 100 MG/10ML: INTRAMUSCULAR | Qty: 1 | Status: AC

## 2022-07-19 ENCOUNTER — Other Ambulatory Visit: Payer: Self-pay

## 2022-07-19 ENCOUNTER — Encounter: Payer: Self-pay | Admitting: Hematology and Oncology

## 2022-07-19 ENCOUNTER — Inpatient Hospital Stay: Payer: Medicare Other

## 2022-07-19 ENCOUNTER — Inpatient Hospital Stay (HOSPITAL_BASED_OUTPATIENT_CLINIC_OR_DEPARTMENT_OTHER): Payer: Medicare Other | Admitting: Hematology and Oncology

## 2022-07-19 ENCOUNTER — Inpatient Hospital Stay: Payer: Medicare Other | Attending: Gynecologic Oncology

## 2022-07-19 VITALS — BP 174/80 | HR 83 | Resp 16

## 2022-07-19 VITALS — BP 163/90 | HR 88 | Temp 97.8°F | Resp 18 | Ht 67.0 in | Wt 150.6 lb

## 2022-07-19 DIAGNOSIS — D6481 Anemia due to antineoplastic chemotherapy: Secondary | ICD-10-CM

## 2022-07-19 DIAGNOSIS — C561 Malignant neoplasm of right ovary: Secondary | ICD-10-CM

## 2022-07-19 DIAGNOSIS — T451X5A Adverse effect of antineoplastic and immunosuppressive drugs, initial encounter: Secondary | ICD-10-CM | POA: Diagnosis not present

## 2022-07-19 DIAGNOSIS — I10 Essential (primary) hypertension: Secondary | ICD-10-CM

## 2022-07-19 DIAGNOSIS — Z5111 Encounter for antineoplastic chemotherapy: Secondary | ICD-10-CM | POA: Insufficient documentation

## 2022-07-19 DIAGNOSIS — Z79899 Other long term (current) drug therapy: Secondary | ICD-10-CM | POA: Insufficient documentation

## 2022-07-19 DIAGNOSIS — Z95828 Presence of other vascular implants and grafts: Secondary | ICD-10-CM

## 2022-07-19 LAB — CBC WITH DIFFERENTIAL (CANCER CENTER ONLY)
Abs Immature Granulocytes: 0.05 10*3/uL (ref 0.00–0.07)
Basophils Absolute: 0 10*3/uL (ref 0.0–0.1)
Basophils Relative: 0 %
Eosinophils Absolute: 0 10*3/uL (ref 0.0–0.5)
Eosinophils Relative: 0 %
HCT: 29.9 % — ABNORMAL LOW (ref 36.0–46.0)
Hemoglobin: 10.1 g/dL — ABNORMAL LOW (ref 12.0–15.0)
Immature Granulocytes: 1 %
Lymphocytes Relative: 7 %
Lymphs Abs: 0.5 10*3/uL — ABNORMAL LOW (ref 0.7–4.0)
MCH: 32.3 pg (ref 26.0–34.0)
MCHC: 33.8 g/dL (ref 30.0–36.0)
MCV: 95.5 fL (ref 80.0–100.0)
Monocytes Absolute: 0.1 10*3/uL (ref 0.1–1.0)
Monocytes Relative: 1 %
Neutro Abs: 6.9 10*3/uL (ref 1.7–7.7)
Neutrophils Relative %: 91 %
Platelet Count: 166 10*3/uL (ref 150–400)
RBC: 3.13 MIL/uL — ABNORMAL LOW (ref 3.87–5.11)
RDW: 17.4 % — ABNORMAL HIGH (ref 11.5–15.5)
WBC Count: 7.5 10*3/uL (ref 4.0–10.5)
nRBC: 0 % (ref 0.0–0.2)

## 2022-07-19 LAB — CMP (CANCER CENTER ONLY)
ALT: 12 U/L (ref 0–44)
AST: 17 U/L (ref 15–41)
Albumin: 3.9 g/dL (ref 3.5–5.0)
Alkaline Phosphatase: 50 U/L (ref 38–126)
Anion gap: 10 (ref 5–15)
BUN: 25 mg/dL — ABNORMAL HIGH (ref 8–23)
CO2: 23 mmol/L (ref 22–32)
Calcium: 9.3 mg/dL (ref 8.9–10.3)
Chloride: 103 mmol/L (ref 98–111)
Creatinine: 1.03 mg/dL — ABNORMAL HIGH (ref 0.44–1.00)
GFR, Estimated: 56 mL/min — ABNORMAL LOW (ref >60)
Glucose, Bld: 184 mg/dL — ABNORMAL HIGH (ref 70–99)
Potassium: 4.3 mmol/L (ref 3.5–5.1)
Sodium: 136 mmol/L (ref 135–145)
Total Bilirubin: 0.4 mg/dL (ref 0.3–1.2)
Total Protein: 7 g/dL (ref 6.5–8.1)

## 2022-07-19 MED ORDER — SODIUM CHLORIDE 0.9 % IV SOLN
10.0000 mg | Freq: Once | INTRAVENOUS | Status: AC
  Administered 2022-07-19: 10 mg via INTRAVENOUS
  Filled 2022-07-19: qty 10

## 2022-07-19 MED ORDER — SODIUM CHLORIDE 0.9 % IV SOLN: Freq: Once | INTRAVENOUS | Status: AC

## 2022-07-19 MED ORDER — FAMOTIDINE IN NACL 20-0.9 MG/50ML-% IV SOLN
20.0000 mg | Freq: Once | INTRAVENOUS | Status: AC
  Administered 2022-07-19: 20 mg via INTRAVENOUS
  Filled 2022-07-19: qty 50

## 2022-07-19 MED ORDER — SODIUM CHLORIDE 0.9 % IV SOLN
37.5000 mg/m2 | Freq: Once | INTRAVENOUS | Status: AC
  Administered 2022-07-19: 68 mg via INTRAVENOUS
  Filled 2022-07-19: qty 6.8

## 2022-07-19 MED ORDER — SODIUM CHLORIDE 0.9% FLUSH
10.0000 mL | Freq: Once | INTRAVENOUS | Status: AC
  Administered 2022-07-19: 10 mL

## 2022-07-19 MED ORDER — HEPARIN SOD (PORK) LOCK FLUSH 100 UNIT/ML IV SOLN
500.0000 [IU] | Freq: Once | INTRAVENOUS | Status: AC | PRN
  Administered 2022-07-19: 500 [IU]

## 2022-07-19 MED ORDER — PALONOSETRON HCL INJECTION 0.25 MG/5ML
0.2500 mg | Freq: Once | INTRAVENOUS | Status: AC
  Administered 2022-07-19: 0.25 mg via INTRAVENOUS
  Filled 2022-07-19: qty 5

## 2022-07-19 MED ORDER — SODIUM CHLORIDE 0.9 % IV SOLN
375.5000 mg | Freq: Once | INTRAVENOUS | Status: AC
  Administered 2022-07-19: 380 mg via INTRAVENOUS
  Filled 2022-07-19: qty 38

## 2022-07-19 MED ORDER — SODIUM CHLORIDE 0.9% FLUSH
10.0000 mL | INTRAVENOUS | Status: DC | PRN
  Administered 2022-07-19: 10 mL

## 2022-07-19 MED ORDER — CETIRIZINE HCL 10 MG/ML IV SOLN
10.0000 mg | Freq: Once | INTRAVENOUS | Status: AC
  Administered 2022-07-19: 10 mg via INTRAVENOUS
  Filled 2022-07-19: qty 1

## 2022-07-19 NOTE — Progress Notes (Signed)
[facility] OFFICE PROGRESS NOTE  Patient Care Team: [name], DO as PCP - General (Family Medicine) Vania Rea, MD as Attending Physician (Obstetrics and Gynecology) Linus Mako, MD as Consulting Physician (Family Medicine) Celestia Khat, OD (Optometry)  ASSESSMENT & PLAN:  Right ovarian epithelial cancer [facility]) She tolerated recent reduced dose chemotherapy very well We will continue similar dose reduction We will proceed with treatment without delay Discussed with the patient what to expect after completion of treatment  Anemia due to antineoplastic chemotherapy This is improved since recent admission Observe closely  HTN (hypertension) Your blood pressure is highly elevated today likely due to anxiety from recent unexpected fall Observe closely for now  No orders of the defined types were placed in this encounter.   All questions were answered. The patient knows to call the clinic with any problems, questions or concerns. The total time spent in the appointment was 20 minutes encounter with patients including review of chart and various tests results, discussions about plan of care and coordination of care plan   [name], MD 07/19/2022 9:57 AM  INTERVAL HISTORY: Please see below for problem oriented charting. she returns for treatment follow-up seen prior to cycle 5 of treatment She had a fall this morning due to turning around too quickly She did not injure herself She tolerated last cycle of therapy well She has a lot of questions related to future follow-up-  REVIEW OF SYSTEMS:   Constitutional: Denies fevers, chills or abnormal weight loss Eyes: Denies blurriness of vision Ears, nose, mouth, throat, and face: Denies mucositis or sore throat Respiratory: Denies cough, dyspnea or wheezes Cardiovascular: Denies palpitation, chest discomfort or lower extremity swelling Gastrointestinal:  Denies nausea, heartburn or change in bowel  habits Skin: Denies abnormal skin rashes Lymphatics: Denies new lymphadenopathy or easy bruising Neurological:Denies numbness, tingling or new weaknesses Behavioral/Psych: Mood is stable, no new changes  All other systems were reviewed with the patient and are negative.  I have reviewed the past medical history, past surgical history, social history and family history with the patient and they are unchanged from previous note.  ALLERGIES:  is allergic to paclitaxel, emend [fosaprepitant dimeglumine], codeine, nsaids, penicillins, vibra-tab [doxycycline], iodine, and latex.  MEDICATIONS:  Current Outpatient Medications  Medication Sig Dispense Refill   acetaminophen (TYLENOL) 650 MG CR tablet Take 1,300 mg by mouth as needed for pain.     aspirin EC 81 MG tablet Take 81 mg by mouth at bedtime. Swallow whole.     atenolol (TENORMIN) 25 MG tablet Take 0.5 tablets (12.5 mg total) by mouth 2 (two) times daily. 90 tablet 3   Calcium Carb-Cholecalciferol (CALCIUM + VITAMIN D3 PO) Take 1 tablet by mouth 2 (two) times daily.     dexamethasone (DECADRON) 4 MG tablet Take 2 tabs at the day before and 1 tab daily for 2 days after chemotherapy (Patient taking differently: Take 4-8 mg by mouth See admin instructions. Take 8 mg the day before chemo and 4 mg daily for 2 days after chemotherapy) 36 tablet 6   EPINEPHrine 0.3 mg/0.3 mL IJ SOAJ injection Inject 0.3 mg into the muscle as needed for anaphylaxis. 2 each 2   levothyroxine (SYNTHROID) 100 MCG tablet Take 1 tablet (100 mcg total) by mouth in the morning. 90 tablet 3   lidocaine-prilocaine (EMLA) cream Apply to affected area once (Patient taking differently: Apply 1 Application topically as needed (port access).) 30 g 3   loratadine (CLARITIN) 10 MG tablet  Take 10 mg by mouth daily as needed for allergies.     Multiple Vitamin (MULTIVITAMIN) tablet Take 1 tablet by mouth in the morning.     ondansetron (ZOFRAN) 8 MG tablet Take 1 tablet (8 mg total)  by mouth every 8 (eight) hours as needed for nausea or vomiting. Start on the third day after chemotherapy. (Patient taking differently: Take 8 mg by mouth as needed for nausea or vomiting.) 30 tablet 1   simvastatin (ZOCOR) 20 MG tablet Take 1 tablet (20 mg total) by mouth at bedtime. 90 tablet 3   No current facility-administered medications for this visit.   Facility-Administered Medications Ordered in Other Visits  Medication Dose Route Frequency Provider Last Rate Last Admin   CARBOplatin (PARAPLATIN) 380 mg in sodium chloride 0.9 % 100 mL chemo infusion  380 mg Intravenous Once Alvy Bimler, Errik Mitchelle, MD       DOCEtaxel (TAXOTERE) 68 mg in sodium chloride 0.9 % 150 mL chemo infusion  37.5 mg/m2 (Treatment Plan Recorded) Intravenous Once Alvy Bimler, Jeovanny Cuadros, MD       heparin lock flush 100 unit/mL  500 Units Intracatheter Once PRN Alvy Bimler, Paytyn Mesta, MD       sodium chloride flush (NS) 0.9 % injection 10 mL  10 mL Intracatheter PRN Alvy Bimler, Billie Trager, MD        SUMMARY OF ONCOLOGIC HISTORY: Oncology History Overview Note  High grade papillary serous 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   Right ovarian epithelial cancer ([place])  03/16/2022 Imaging   1. Large, mixed solid and cystic mass in the low pelvis, difficult to clearly delineate from adjacent ascites although at least 10.2 x 10.0 cm. This is highly concerning for primary ovarian malignancy and most likely arises from the right ovary, although may involve both ovaries. This may be further evaluated by contrast enhanced MRI of the pelvis if desired. 2. Large volume ascites throughout the abdomen and pelvis, presumed malignant. Stranding and nodularity of the omentum and peritoneum, particularly in the ventral abdomen and left upper quadrant, highly suspicious for peritoneal metastatic disease. 3. Cirrhotic morphology of the liver, which may contribute to ascites. 4. Severely atrophic left kidney, consistent with remote prior infectious, obstructive, or  ischemic insult. 5. Coronary artery disease.   Aortic Atherosclerosis (ICD10-I70.0).   03/17/2022 Imaging   1. There is a large solid and cystic mass within the right adnexa which measures up to 9.5 cm in maximum dimension. Findings are concerning for a primary ovarian neoplasm. 2. Large volume of ascites within the abdomen and pelvis. Given the presence of peritoneal nodules on the CT from the prior day findings are concerning for malignant ascites.   03/19/2022 Procedure   CT-guided biopsy of omental mass.    03/19/2022 Procedure   Successful ultrasound-guided paracentesis yielding 4.2 liters of peritoneal fluid.   03/19/2022 Procedure   Successful placement of a left internal jugular approach power injectable Port-A-Cath. The catheter is ready for immediate use.   03/30/2022 Initial Diagnosis   Ovarian cancer ([place])   03/30/2022 Pathology Results   A. RIGHT OVARY AND FALLOPIAN TUBE, SALPINGO OOPHORECTOMY: Poorly differentiated carcinoma immunohistochemically most consistent with a high grade serous carcinoma Tumor measures 10.7 x 10.6 x 4.6 cm Tumor involves ovarian and mesosalpinx serosal surfaces and right fallopian tube pT2a pN n/a  Note: The tumor consists of sheets and cords of high-grade nuclei with areas of necrosis and adjacent prominent stroma suggesting papillary cores the overall morphology is slightly unusual and not classic; therefore, a battery of  immunohistochemical stains were performed to confirm the diagnosis and exclude a high-grade germ cell tumor.  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 Additionally there is nonspecific focal PLAP positivity; CD117 positivity and D2-40 positivity of uncertain clinical significance.  The tumor is negative for AFP, hCG, CD30, CD31, CK20, glypican-3, and OCT 3/4.  Dr. Saralyn Pilar has peer reviewed the case and agrees with  diagnosis. Dr. Alric Seton has also initiated the workup of the case and agrees with the interpretation.  B. OMENTUM, RESECTION: Benign omentum Negative for tumor  C. RIGHT PELVIC SIDEWALL, BIOPSY: Benign mesothelium, fibrovascular stroma and adipose Negative for tumor  D. ANTERIOR CUL DE SAC, BIOPSY: Benign mesothelium Negative for tumor  E. LEFT PELVIC SIDEWALL, BIOPSY: Benign mesothelium and adipose Negative for tumor   ONCOLOGY TABLE:  OVARY or FALLOPIAN TUBE or PRIMARY PERITONEUM: Resection  Procedure: Right salpingo-oophorectomy Specimen Integrity: Grossly focally disrupted Tumor Site: Right ovary Tumor Size: 10.7 x 10.6 x 4.6 cm Histologic Type: Poorly differentiated carcinoma most consistent with a high-grade serous carcinoma Histologic Grade: High-grade Ovarian Surface Involvement: Surface involvement Fallopian Tube Surface Involvement: Mesosalpinx serosal surface involvement and right fallopian tube involvement Implants (required for advanced stage serous/seromucinous borderline tumors only): Not identified Other Tissue/ Organ Involvement: N/A Largest Extrapelvic Peritoneal Focus: N/A Peritoneal/Ascitic Fluid Involvement: N/A Chemotherapy Response Score (CRS): N/A, no known presurgical therapy Regional Lymph Nodes: N/A, no lymph nodes submitted Distant Metastasis:      Distant Site(s) Involved: N/A Pathologic Stage Classification (pTNM, AJCC 8th Edition): pT2a, pN N/A Ancillary Studies: Can be performed on physician request Representative Tumor Block: A8 Comment(s): [None]    03/30/2022 Surgery   Preop Diagnosis: Pelvic mass, malignant ascites   Postoperative Diagnosis: at least stage IIB ovarian cancer, suspected high grade serous by frozen section   Surgery: Diagnostic laparoscopy, right salpingo-oophorectomy, peritoneal biopsies, infra-colic omentectomy    Surgeon:  Valarie Cones MD    Operative findings: On exam under anesthesia, somewhat mobile mass  appreciated in the cul-de-sac to the right.  On agnostic laparoscopy, normal upper abdominal findings other than moderate volume ascites.  Normal-appearing peritoneum, omentum, small bowel.  Right adnexa enlarged with a mass measuring approximately 8-10 cm.  No carcinomatosis appreciated.  Given this, decision made to convert to an open procedure for tumor debulking.  Approximately 5 L of yellow-tinged ascites was removed upon intra-abdominal entry.  Normal upper abdominal survey including smooth diaphragm and liver, stomach, and omentum.  The small bowel was run from the cecum to the ligament of Treitz with no abnormalities or carcinomatosis noted.  Right ovary replaced by an approximately 8 cm smooth mass, dilated and enlarged fallopian tube.  The mass itself was adherent to the right pelvic sidewall, sigmoid colon, and densely adherent to the anterior cul-de-sac/bladder peritoneum.  Frozen section consistent with invasive carcinoma, likely high-grade.  Left tube and ovary were surgically absent.  Sigmoid colon with adhesions to the left pelvic sidewall, left aspect of the bladder and the left vaginal cuff.  Somewhat redundant sigmoid which looped over on itself secondary to these adhesions.  Numerous diverticula noted of the sigmoid colon as well as the transverse colon.  The end of surgery, an R0 resection was accomplished.     04/13/2022 Cancer Staging   Staging form: Ovary, Fallopian Tube, and Primary Peritoneal Carcinoma, AJCC 8th Edition - Pathologic stage from 04/13/2022: Stage II (pT2, pN0, cM0) - Signed by Alvy Bimler,  Jedadiah Abdallah, MD on 04/13/2022 Stage prefix: Initial diagnosis   04/26/2022 -  Chemotherapy   Patient is on Treatment Plan : OVARIAN Carboplatin (AUC 6) + Paclitaxel (175) q21d X 6 Cycles     05/17/2022 Tumor Marker   Patient's tumor was tested for the following markers: CA-125. Results of the tumor marker test revealed 12.7.   06/06/2022 Genetic Testing   Germline: Negative [facility]  Multi-Cancer +RNA Panel.  Report date is 06/06/2022.   The Multi-Cancer + RNA Panel offered by [facility] includes sequencing and/or deletion/duplication analysis of the following 70 genes:  AIP*, ALK, APC*, ATM*, AXIN2*, BAP1*, BARD1*, BLM*, BMPR1A*, BRCA1*, BRCA2*, BRIP1*, CDC73*, CDH1*, CDK4, CDKN1B*, CDKN2A, CHEK2*, CTNNA1*, DICER1*, EPCAM (del/dup only), EGFR, FH*, FLCN*, GREM1 (promoter dup only), HOXB13, KIT, LZTR1, MAX*, MBD4, MEN1*, MET, MITF, MLH1*, MSH2*, MSH3*, MSH6*, MUTYH*, NF1*, NF2*, NTHL1*, PALB2*, PDGFRA, PMS2*, POLD1*, POLE*, POT1*, PRKAR1A*, PTCH1*, PTEN*, RAD51C*, RAD51D*, RB1*, RET, SDHA* (sequencing only), SDHAF2*, SDHB*, SDHC*, SDHD*, SMAD4*, SMARCA4*, SMARCB1*, SMARCE1*, STK11*, SUFU*, TMEM127*, TP53*, TSC1*, TSC2*, VHL*. RNA analysis is performed for * genes.  Somatic: Positive genomic instability score (GIS) of 62 through [facility].  No pathogenic variants detected in BRCA1/2.  Report date is June 10, 2022.   [facility] CDx includes sequencing and large rearrangement analysis of BRCA1/2 and genomic instability status through loss of heterozygosity (LOH), telomeric allelic imbalance (TAI) and large-scale state transitions (LST).     06/08/2022 Tumor Marker   Patient's tumor was tested for the following markers: Ca-125. Results of the tumor marker test revealed 16.5.   06/13/2022 - 06/17/2022 Hospital Admission   The patient was admitted to the hospital and found to have diarrhea, diverticulitis and pancytopenia   06/13/2022 Imaging   Colonic diverticulosis. Inflammatory stranding around the mid sigmoid colon compatible with active diverticulitis.   Interval resection of the previously seen lower pelvic solid and cystic mass and resolution of ascites.   Aortic atherosclerosis.   Severely atrophic left kidney, stable since prior study.   06/30/2022 Tumor Marker   Patient's tumor was tested for the following markers: CA-125. Results of the tumor marker test  revealed 15.     PHYSICAL EXAMINATION: ECOG PERFORMANCE STATUS: 1 - Symptomatic but completely ambulatory  Vitals:   07/19/22 0840  BP: (!) 163/90  Pulse: 88  Resp: 18  Temp: 97.8 F (36.6 C)  SpO2: 100%   Filed Weights   07/19/22 0840  Weight: 150 lb 9.6 oz (68.3 kg)    GENERAL:alert, no distress and comfortable  NEURO: alert & oriented x 3 with fluent speech, no focal motor/sensory deficits  LABORATORY DATA:  I have reviewed the data as listed    Component Value Date/Time   NA 136 07/19/2022 0750   NA 134 06/01/2022 0848   K 4.3 07/19/2022 0750   CL 103 07/19/2022 0750   CO2 23 07/19/2022 0750   GLUCOSE 184 (H) 07/19/2022 0750   BUN 25 (H) 07/19/2022 0750   BUN 7 (L) 06/01/2022 0848   CREATININE 1.03 (H) 07/19/2022 0750   CREATININE 0.99 02/05/2013 0757   CALCIUM 9.3 07/19/2022 0750   PROT 7.0 07/19/2022 0750   PROT 6.0 06/01/2022 0848   ALBUMIN 3.9 07/19/2022 0750   ALBUMIN 3.6 (L) 06/01/2022 0848   AST 17 07/19/2022 0750   ALT 12 07/19/2022 0750   ALKPHOS 50 07/19/2022 0750   BILITOT 0.4 07/19/2022 0750   GFRNONAA 56 (L) 07/19/2022 0750   GFRNONAA 59 (L) 02/05/2013 0757   GFRAA 61 05/20/2020 0850  GFRAA 68 02/05/2013 0757    No results found for: "SPEP", "UPEP"  Lab Results  Component Value Date   WBC 7.5 07/19/2022   NEUTROABS 6.9 07/19/2022   HGB 10.1 (L) 07/19/2022   HCT 29.9 (L) 07/19/2022   MCV 95.5 07/19/2022   PLT 166 07/19/2022      Chemistry      Component Value Date/Time   NA 136 07/19/2022 0750   NA 134 06/01/2022 0848   K 4.3 07/19/2022 0750   CL 103 07/19/2022 0750   CO2 23 07/19/2022 0750   BUN 25 (H) 07/19/2022 0750   BUN 7 (L) 06/01/2022 0848   CREATININE 1.03 (H) 07/19/2022 0750   CREATININE 0.99 02/05/2013 0757      Component Value Date/Time   CALCIUM 9.3 07/19/2022 0750   ALKPHOS 50 07/19/2022 0750   AST 17 07/19/2022 0750   ALT 12 07/19/2022 0750   BILITOT 0.4 07/19/2022 0750

## 2022-07-19 NOTE — Assessment & Plan Note (Signed)
She tolerated recent reduced dose chemotherapy very well We will continue similar dose reduction We will proceed with treatment without delay Discussed with the patient what to expect after completion of treatment

## 2022-07-19 NOTE — Assessment & Plan Note (Signed)
Your blood pressure is highly elevated today likely due to anxiety from recent unexpected fall Observe closely for now

## 2022-07-19 NOTE — Assessment & Plan Note (Signed)
This is improved since recent admission Observe closely 

## 2022-07-19 NOTE — Patient Instructions (Addendum)
[facility]  Discharge Instructions: Thank you for choosing [facility] to provide your oncology and hematology care.   If you have a lab appointment with the [facility], please go directly to the [facility] and check in at the registration area.   Wear comfortable clothing and clothing appropriate for easy access to any Portacath or PICC line.   We strive to give you quality time with your provider. You may need to reschedule your appointment if you arrive late (15 or more minutes).  Arriving late affects you and other patients whose appointments are after yours.  Also, if you miss three or more appointments without notifying the office, you may be dismissed from the clinic at the provider's discretion.      For prescription refill requests, have your pharmacy contact our office and allow 72 hours for refills to be completed.    Today you received the following chemotherapy and/or immunotherapy agents Docetaxel/Carboplatin.      To help prevent nausea and vomiting after your treatment, we encourage you to take your nausea medication as directed.  BELOW ARE SYMPTOMS THAT SHOULD BE REPORTED IMMEDIATELY: *FEVER GREATER THAN 100.4 F (38 C) OR HIGHER *CHILLS OR SWEATING *NAUSEA AND VOMITING THAT IS NOT CONTROLLED WITH YOUR NAUSEA MEDICATION *UNUSUAL SHORTNESS OF BREATH *UNUSUAL BRUISING OR BLEEDING *URINARY PROBLEMS (pain or burning when urinating, or frequent urination) *BOWEL PROBLEMS (unusual diarrhea, constipation, pain near the anus) TENDERNESS IN MOUTH AND THROAT WITH OR WITHOUT PRESENCE OF ULCERS (sore throat, sores in mouth, or a toothache) UNUSUAL RASH, SWELLING OR PAIN  UNUSUAL VAGINAL DISCHARGE OR ITCHING   Items with * indicate a potential emergency and should be followed up as soon as possible or go to the Emergency Department if any problems should occur.  Please show the CHEMOTHERAPY ALERT CARD or IMMUNOTHERAPY ALERT  CARD at check-in to the Emergency Department and triage nurse.  Should you have questions after your visit or need to cancel or reschedule your appointment, please contact [facility]  Dept: [phone]  and follow the prompts.  Office hours are 8:00 a.m. to 4:30 p.m. Monday - Friday. Please note that voicemails left after 4:00 p.m. may not be returned until the following business day.  We are closed weekends and major holidays. You have access to a nurse at all times for urgent questions. Please call the main number to the clinic Dept: [phone] and follow the prompts.   For any non-urgent questions, you may also contact your provider using MyChart. We now offer e-Visits for anyone 27 and older to request care online for non-urgent symptoms. For details visit [email or web address].   Also download the MyChart app! Go to the app store, search "MyChart", open the app, select [facility], and log in with your MyChart username and password.

## 2022-07-21 LAB — CA 125: Cancer Antigen (CA) 125: 11.4 U/mL (ref 0.0–38.1)

## 2022-08-09 MED FILL — Dexamethasone Sodium Phosphate Inj 100 MG/10ML: INTRAMUSCULAR | Qty: 1 | Status: AC

## 2022-08-10 ENCOUNTER — Inpatient Hospital Stay: Payer: Medicare Other | Admitting: Dietician

## 2022-08-10 ENCOUNTER — Other Ambulatory Visit: Payer: Self-pay

## 2022-08-10 ENCOUNTER — Telehealth: Payer: Self-pay | Admitting: Pharmacy Technician

## 2022-08-10 ENCOUNTER — Other Ambulatory Visit (HOSPITAL_COMMUNITY): Payer: Self-pay

## 2022-08-10 ENCOUNTER — Inpatient Hospital Stay (HOSPITAL_BASED_OUTPATIENT_CLINIC_OR_DEPARTMENT_OTHER): Payer: Medicare Other | Admitting: Hematology and Oncology

## 2022-08-10 ENCOUNTER — Encounter: Payer: Self-pay | Admitting: Hematology and Oncology

## 2022-08-10 ENCOUNTER — Inpatient Hospital Stay: Payer: Medicare Other

## 2022-08-10 ENCOUNTER — Inpatient Hospital Stay: Payer: Medicare Other | Attending: Gynecologic Oncology

## 2022-08-10 VITALS — BP 155/77 | HR 84 | Temp 98.0°F | Resp 18 | Ht 67.0 in | Wt 151.0 lb

## 2022-08-10 VITALS — BP 172/85 | HR 87 | Temp 98.0°F | Resp 16

## 2022-08-10 DIAGNOSIS — C561 Malignant neoplasm of right ovary: Secondary | ICD-10-CM

## 2022-08-10 DIAGNOSIS — T451X5A Adverse effect of antineoplastic and immunosuppressive drugs, initial encounter: Secondary | ICD-10-CM | POA: Diagnosis not present

## 2022-08-10 DIAGNOSIS — Z5111 Encounter for antineoplastic chemotherapy: Secondary | ICD-10-CM | POA: Insufficient documentation

## 2022-08-10 DIAGNOSIS — D6481 Anemia due to antineoplastic chemotherapy: Secondary | ICD-10-CM | POA: Diagnosis not present

## 2022-08-10 DIAGNOSIS — R5383 Other fatigue: Secondary | ICD-10-CM | POA: Diagnosis not present

## 2022-08-10 DIAGNOSIS — N1831 Chronic kidney disease, stage 3a: Secondary | ICD-10-CM | POA: Diagnosis not present

## 2022-08-10 DIAGNOSIS — Z95828 Presence of other vascular implants and grafts: Secondary | ICD-10-CM

## 2022-08-10 DIAGNOSIS — Z79899 Other long term (current) drug therapy: Secondary | ICD-10-CM | POA: Insufficient documentation

## 2022-08-10 LAB — CBC WITH DIFFERENTIAL (CANCER CENTER ONLY)
Abs Immature Granulocytes: 0.09 10*3/uL — ABNORMAL HIGH (ref 0.00–0.07)
Basophils Absolute: 0 10*3/uL (ref 0.0–0.1)
Basophils Relative: 0 %
Eosinophils Absolute: 0 10*3/uL (ref 0.0–0.5)
Eosinophils Relative: 0 %
HCT: 26.4 % — ABNORMAL LOW (ref 36.0–46.0)
Hemoglobin: 9 g/dL — ABNORMAL LOW (ref 12.0–15.0)
Immature Granulocytes: 1 %
Lymphocytes Relative: 5 %
Lymphs Abs: 0.6 10*3/uL — ABNORMAL LOW (ref 0.7–4.0)
MCH: 33.3 pg (ref 26.0–34.0)
MCHC: 34.1 g/dL (ref 30.0–36.0)
MCV: 97.8 fL (ref 80.0–100.0)
Monocytes Absolute: 0.2 10*3/uL (ref 0.1–1.0)
Monocytes Relative: 2 %
Neutro Abs: 9.8 10*3/uL — ABNORMAL HIGH (ref 1.7–7.7)
Neutrophils Relative %: 92 %
Platelet Count: 192 10*3/uL (ref 150–400)
RBC: 2.7 MIL/uL — ABNORMAL LOW (ref 3.87–5.11)
RDW: 16.5 % — ABNORMAL HIGH (ref 11.5–15.5)
WBC Count: 10.7 10*3/uL — ABNORMAL HIGH (ref 4.0–10.5)
nRBC: 0 % (ref 0.0–0.2)

## 2022-08-10 LAB — CMP (CANCER CENTER ONLY)
ALT: 11 U/L (ref 0–44)
AST: 12 U/L — ABNORMAL LOW (ref 15–41)
Albumin: 3.7 g/dL (ref 3.5–5.0)
Alkaline Phosphatase: 58 U/L (ref 38–126)
Anion gap: 9 (ref 5–15)
BUN: 24 mg/dL — ABNORMAL HIGH (ref 8–23)
CO2: 25 mmol/L (ref 22–32)
Calcium: 9.5 mg/dL (ref 8.9–10.3)
Chloride: 103 mmol/L (ref 98–111)
Creatinine: 0.93 mg/dL (ref 0.44–1.00)
GFR, Estimated: >60 mL/min (ref >60)
Glucose, Bld: 205 mg/dL — ABNORMAL HIGH (ref 70–99)
Potassium: 4.2 mmol/L (ref 3.5–5.1)
Sodium: 137 mmol/L (ref 135–145)
Total Bilirubin: 0.3 mg/dL (ref 0.3–1.2)
Total Protein: 7.1 g/dL (ref 6.5–8.1)

## 2022-08-10 MED ORDER — HEPARIN SOD (PORK) LOCK FLUSH 100 UNIT/ML IV SOLN
500.0000 [IU] | Freq: Once | INTRAVENOUS | Status: AC | PRN
  Administered 2022-08-10: 500 [IU]

## 2022-08-10 MED ORDER — CETIRIZINE HCL 10 MG/ML IV SOLN
10.0000 mg | Freq: Once | INTRAVENOUS | Status: AC
  Administered 2022-08-10: 10 mg via INTRAVENOUS
  Filled 2022-08-10: qty 1

## 2022-08-10 MED ORDER — SODIUM CHLORIDE 0.9 % IV SOLN
10.0000 mg | Freq: Once | INTRAVENOUS | Status: AC
  Administered 2022-08-10: 10 mg via INTRAVENOUS
  Filled 2022-08-10: qty 10

## 2022-08-10 MED ORDER — SODIUM CHLORIDE 0.9 % IV SOLN
37.5000 mg/m2 | Freq: Once | INTRAVENOUS | Status: AC
  Administered 2022-08-10: 68 mg via INTRAVENOUS
  Filled 2022-08-10: qty 6.8

## 2022-08-10 MED ORDER — SODIUM CHLORIDE 0.9 % IV SOLN
383.0000 mg | Freq: Once | INTRAVENOUS | Status: AC
  Administered 2022-08-10: 380 mg via INTRAVENOUS
  Filled 2022-08-10: qty 38

## 2022-08-10 MED ORDER — SODIUM CHLORIDE 0.9% FLUSH
10.0000 mL | Freq: Once | INTRAVENOUS | Status: AC
  Administered 2022-08-10: 10 mL

## 2022-08-10 MED ORDER — FAMOTIDINE IN NACL 20-0.9 MG/50ML-% IV SOLN
20.0000 mg | Freq: Once | INTRAVENOUS | Status: AC
  Administered 2022-08-10: 20 mg via INTRAVENOUS
  Filled 2022-08-10: qty 50

## 2022-08-10 MED ORDER — SODIUM CHLORIDE 0.9 % IV SOLN: Freq: Once | INTRAVENOUS | Status: AC

## 2022-08-10 MED ORDER — SODIUM CHLORIDE 0.9% FLUSH
10.0000 mL | INTRAVENOUS | Status: DC | PRN
  Administered 2022-08-10: 10 mL

## 2022-08-10 MED ORDER — PALONOSETRON HCL INJECTION 0.25 MG/5ML
0.2500 mg | Freq: Once | INTRAVENOUS | Status: AC
  Administered 2022-08-10: 0.25 mg via INTRAVENOUS
  Filled 2022-08-10: qty 5

## 2022-08-10 MED ORDER — OLAPARIB 100 MG PO TABS
100.0000 mg | ORAL_TABLET | Freq: Two times a day (BID) | ORAL | 11 refills | Status: DC
  Filled 2022-08-10: qty 60, 30d supply, fill #0

## 2022-08-10 NOTE — Progress Notes (Signed)
Nutrition Follow-up:  Patient with ovarian cancer. She is receiving carboplatin + taxotere q21d  Met with patient during infusion. She reports ongoing altered taste of foods. Patient is not using baking soda salt water rinses. This would be one more thing she would need to do. Patient reports she is managing to get food in and has found some things that are tolerable. Patient is limiting vegetables secondary to diarrhea following therapy. Patient reports drinking plenty of water. She reports weakness. Sometimes pt finds it difficult to get out of the chair and into the kitchen to prepare food. She is drinking one Ensure in the morning. Patient asking if she could increase this.    Medications: reviewed   Labs: glucose 205  Anthropometrics: Wt 151 lb today   3/11 - 150 lb 9.6 oz 2/19 - 153 lb    NUTRITION DIAGNOSIS: Unintended weight loss stable    INTERVENTION:  Encouraged high calorie high protein foods for weight maintenance Continue drinking Ensure Plus HP daily - may increase to BID with decreased oral intake Samples of Ensure + CIB vanilla powder provided for pt to try   MONITORING, EVALUATION, GOAL: weight trends, intake   NEXT VISIT: To be scheduled as needed

## 2022-08-10 NOTE — Assessment & Plan Note (Signed)

## 2022-08-10 NOTE — Patient Instructions (Signed)
[facility]  Discharge Instructions: Thank you for choosing [facility] to provide your oncology and hematology care.   If you have a lab appointment with the Cancer Center, please go directly to the Cancer Center and check in at the registration area.   Wear comfortable clothing and clothing appropriate for easy access to any Portacath or PICC line.   We strive to give you quality time with your provider. You may need to reschedule your appointment if you arrive late (15 or more minutes).  Arriving late affects you and other patients whose appointments are after yours.  Also, if you miss three or more appointments without notifying the office, you may be dismissed from the clinic at the provider's discretion.      For prescription refill requests, have your pharmacy contact our office and allow 72 hours for refills to be completed.    Today you received the following chemotherapy and/or immunotherapy agents Docetaxel and Carboplatin      To help prevent nausea and vomiting after your treatment, we encourage you to take your nausea medication as directed.  BELOW ARE SYMPTOMS THAT SHOULD BE REPORTED IMMEDIATELY: *FEVER GREATER THAN 100.4 F (38 C) OR HIGHER *CHILLS OR SWEATING *NAUSEA AND VOMITING THAT IS NOT CONTROLLED WITH YOUR NAUSEA MEDICATION *UNUSUAL SHORTNESS OF BREATH *UNUSUAL BRUISING OR BLEEDING *URINARY PROBLEMS (pain or burning when urinating, or frequent urination) *BOWEL PROBLEMS (unusual diarrhea, constipation, pain near the anus) TENDERNESS IN MOUTH AND THROAT WITH OR WITHOUT PRESENCE OF ULCERS (sore throat, sores in mouth, or a toothache) UNUSUAL RASH, SWELLING OR PAIN  UNUSUAL VAGINAL DISCHARGE OR ITCHING   Items with * indicate a potential emergency and should be followed up as soon as possible or go to the Emergency Department if any problems should occur.  Please show the CHEMOTHERAPY ALERT CARD or IMMUNOTHERAPY  ALERT CARD at check-in to the Emergency Department and triage nurse.  Should you have questions after your visit or need to cancel or reschedule your appointment, please contact [facility]  Dept: [phone]  and follow the prompts.  Office hours are 8:00 a.m. to 4:30 p.m. Monday - Friday. Please note that voicemails left after 4:00 p.m. may not be returned until the following business day.  We are closed weekends and major holidays. You have access to a nurse at all times for urgent questions. Please call the main number to the clinic Dept: [phone] and follow the prompts.   For any non-urgent questions, you may also contact your provider using MyChart. We now offer e-Visits for anyone 18 and older to request care online for non-urgent symptoms. For details visit [email or web address].   Also download the MyChart app! Go to the app store, search "MyChart", open the app, select , and log in with your MyChart username and password.  

## 2022-08-10 NOTE — Telephone Encounter (Signed)
/  Oral Oncology Patient Advocate Encounter  Prior Authorization for April Weber has been approved.    PA# [id number] Effective dates: 08/10/22 through 02/06/23 Patients co-pay is $2,329.51.    Lady Deutscher, CPhT-Adv Oncology Pharmacy Patient [place] Direct Number: [phone]  Fax: [phone]

## 2022-08-10 NOTE — Telephone Encounter (Signed)
Oral Oncology Patient Advocate Encounter   Received notification that prior authorization for April Weber is required.   PA submitted on 08/10/22 Key [id number] Status is pending     Lady Deutscher, CPhT-Adv Oncology Pharmacy Patient [place] Direct Number: [phone]  Fax: [phone]

## 2022-08-10 NOTE — Progress Notes (Signed)
[place] OFFICE PROGRESS NOTE  Patient Care Team: [name], DO as PCP - General (Family Medicine) Vania Rea, MD as Attending Physician (Obstetrics and Gynecology) Linus Mako, MD as Consulting Physician (Family Medicine) Celestia Khat, OD (Optometry)  ASSESSMENT & PLAN:  Right ovarian epithelial cancer [facility]) She will complete last cycle of treatment today Moving forward, she will have repeat imaging study next month If her counts recover by then, we will start her on olaparib for maintenance treatment due to presence of genomic instability Given her history of severe pancytopenia, I will start her on a lower dose of olaparib  Anemia due to antineoplastic chemotherapy This is likely due to recent treatment. The patient denies recent history of bleeding such as epistaxis, hematuria or hematochezia. She is asymptomatic from the anemia. I will observe for now.  She does not require transfusion now. I will continue the chemotherapy at current dose without dosage adjustment.  If the anemia gets progressive worse in the future, I might have to delay her treatment or adjust the chemotherapy dose.   Orders Placed This Encounter  Procedures   CT ABDOMEN PELVIS WO CONTRAST    Standing Status:   Future    Standing Expiration Date:   08/10/2023    Order Specific Question:   Preferred imaging location?    Answer:   [facility]    Order Specific Question:   Is Oral Contrast requested for this exam?    Answer:   Yes, Per Radiology protocol    All questions were answered. The patient knows to call the clinic with any problems, questions or concerns. The total time spent in the appointment was 30 minutes encounter with patients including review of chart and various tests results, discussions about plan of care and coordination of care plan   [name], MD 08/10/2022 3:35 PM  INTERVAL HISTORY: Please see below for problem oriented charting. she returns  for treatment follow-up, seen prior to last cycle of treatment Denies peripheral neuropathy She complains of fatigue She is concerned about future follow-up and what to expect  REVIEW OF SYSTEMS:   Constitutional: Denies fevers, chills or abnormal weight loss Eyes: Denies blurriness of vision Ears, nose, mouth, throat, and face: Denies mucositis or sore throat Respiratory: Denies cough, dyspnea or wheezes Cardiovascular: Denies palpitation, chest discomfort or lower extremity swelling Gastrointestinal:  Denies nausea, heartburn or change in bowel habits Skin: Denies abnormal skin rashes Lymphatics: Denies new lymphadenopathy or easy bruising Neurological:Denies numbness, tingling or new weaknesses Behavioral/Psych: Mood is stable, no new changes  All other systems were reviewed with the patient and are negative.  I have reviewed the past medical history, past surgical history, social history and family history with the patient and they are unchanged from previous note.  ALLERGIES:  is allergic to paclitaxel, emend [fosaprepitant dimeglumine], codeine, nsaids, penicillins, vibra-tab [doxycycline], iodine, and latex.  MEDICATIONS:  Current Outpatient Medications  Medication Sig Dispense Refill   olaparib (LYNPARZA) 100 MG tablet Take 1 tablet (100 mg total) by mouth 2 (two) times daily. Swallow whole. May take with food to decrease nausea and vomiting. 60 tablet 11   acetaminophen (TYLENOL) 650 MG CR tablet Take 1,300 mg by mouth as needed for pain.     aspirin EC 81 MG tablet Take 81 mg by mouth at bedtime. Swallow whole.     atenolol (TENORMIN) 25 MG tablet Take 0.5 tablets (12.5 mg total) by mouth 2 (two) times daily. 90 tablet 3  Calcium Carb-Cholecalciferol (CALCIUM + VITAMIN D3 PO) Take 1 tablet by mouth 2 (two) times daily.     dexamethasone (DECADRON) 4 MG tablet Take 2 tabs at the day before and 1 tab daily for 2 days after chemotherapy (Patient taking differently: Take 4-8 mg  by mouth See admin instructions. Take 8 mg the day before chemo and 4 mg daily for 2 days after chemotherapy) 36 tablet 6   EPINEPHrine 0.3 mg/0.3 mL IJ SOAJ injection Inject 0.3 mg into the muscle as needed for anaphylaxis. 2 each 2   levothyroxine (SYNTHROID) 100 MCG tablet Take 1 tablet (100 mcg total) by mouth in the morning. 90 tablet 3   lidocaine-prilocaine (EMLA) cream Apply to affected area once (Patient taking differently: Apply 1 Application topically as needed (port access).) 30 g 3   loratadine (CLARITIN) 10 MG tablet Take 10 mg by mouth daily as needed for allergies.     Multiple Vitamin (MULTIVITAMIN) tablet Take 1 tablet by mouth in the morning.     ondansetron (ZOFRAN) 8 MG tablet Take 1 tablet (8 mg total) by mouth every 8 (eight) hours as needed for nausea or vomiting. Start on the third day after chemotherapy. (Patient taking differently: Take 8 mg by mouth as needed for nausea or vomiting.) 30 tablet 1   promethazine (PHENERGAN) 12.5 MG tablet Take 12.5 mg by mouth every 6 (six) hours as needed for nausea or vomiting. Patient not sure of dose     No current facility-administered medications for this visit.   Facility-Administered Medications Ordered in Other Visits  Medication Dose Route Frequency Provider Last Rate Last Admin   sodium chloride flush (NS) 0.9 % injection 10 mL  10 mL Intracatheter PRN Alvy Bimler, Anjanae Woehrle, MD   10 mL at 08/10/22 1338    SUMMARY OF ONCOLOGIC HISTORY: Oncology History Overview Note  High grade papillary serous 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   Right ovarian epithelial cancer  03/16/2022 Imaging   1. Large, mixed solid and cystic mass in the low pelvis, difficult to clearly delineate from adjacent ascites although at least 10.2 x 10.0 cm. This is highly concerning for primary ovarian malignancy and most likely arises from the right ovary, although may involve both ovaries. This may be further evaluated by contrast enhanced MRI of  the pelvis if desired. 2. Large volume ascites throughout the abdomen and pelvis, presumed malignant. Stranding and nodularity of the omentum and peritoneum, particularly in the ventral abdomen and left upper quadrant, highly suspicious for peritoneal metastatic disease. 3. Cirrhotic morphology of the liver, which may contribute to ascites. 4. Severely atrophic left kidney, consistent with remote prior infectious, obstructive, or ischemic insult. 5. Coronary artery disease.   Aortic Atherosclerosis (ICD10-I70.0).   03/17/2022 Imaging   1. There is a large solid and cystic mass within the right adnexa which measures up to 9.5 cm in maximum dimension. Findings are concerning for a primary ovarian neoplasm. 2. Large volume of ascites within the abdomen and pelvis. Given the presence of peritoneal nodules on the CT from the prior day findings are concerning for malignant ascites.   03/19/2022 Procedure   CT-guided biopsy of omental mass.    03/19/2022 Procedure   Successful ultrasound-guided paracentesis yielding 4.2 liters of peritoneal fluid.   03/19/2022 Procedure   Successful placement of a left internal jugular approach power injectable Port-A-Cath. The catheter is ready for immediate use.   03/30/2022 Initial Diagnosis   Ovarian cancer ([place])   03/30/2022 Pathology Results  A. RIGHT OVARY AND FALLOPIAN TUBE, SALPINGO OOPHORECTOMY: Poorly differentiated carcinoma immunohistochemically most consistent with a high grade serous carcinoma Tumor measures 10.7 x 10.6 x 4.6 cm Tumor involves ovarian and mesosalpinx serosal surfaces and right fallopian tube pT2a pN n/a  Note: The tumor consists of sheets and cords of high-grade nuclei with areas of necrosis and adjacent prominent stroma suggesting papillary cores the overall morphology is slightly unusual and not classic; therefore, a battery of immunohistochemical stains were performed to confirm the diagnosis and exclude a high-grade germ  cell tumor.  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 Additionally there is nonspecific focal PLAP positivity; CD117 positivity and D2-40 positivity of uncertain clinical significance.  The tumor is negative for AFP, hCG, CD30, CD31, CK20, glypican-3, and OCT 3/4.  Dr. Saralyn Pilar has peer reviewed the case and agrees with diagnosis. Dr. Alric Seton has also initiated the workup of the case and agrees with the interpretation.  B. OMENTUM, RESECTION: Benign omentum Negative for tumor  C. RIGHT PELVIC SIDEWALL, BIOPSY: Benign mesothelium, fibrovascular stroma and adipose Negative for tumor  D. ANTERIOR CUL DE SAC, BIOPSY: Benign mesothelium Negative for tumor  E. LEFT PELVIC SIDEWALL, BIOPSY: Benign mesothelium and adipose Negative for tumor   ONCOLOGY TABLE:  OVARY or FALLOPIAN TUBE or PRIMARY PERITONEUM: Resection  Procedure: Right salpingo-oophorectomy Specimen Integrity: Grossly focally disrupted Tumor Site: Right ovary Tumor Size: 10.7 x 10.6 x 4.6 cm Histologic Type: Poorly differentiated carcinoma most consistent with a high-grade serous carcinoma Histologic Grade: High-grade Ovarian Surface Involvement: Surface involvement Fallopian Tube Surface Involvement: Mesosalpinx serosal surface involvement and right fallopian tube involvement Implants (required for advanced stage serous/seromucinous borderline tumors only): Not identified Other Tissue/ Organ Involvement: N/A Largest Extrapelvic Peritoneal Focus: N/A Peritoneal/Ascitic Fluid Involvement: N/A Chemotherapy Response Score (CRS): N/A, no known presurgical therapy Regional Lymph Nodes: N/A, no lymph nodes submitted Distant Metastasis:      Distant Site(s) Involved: N/A Pathologic Stage Classification (pTNM, AJCC 8th Edition): pT2a, pN N/A Ancillary Studies: Can be performed on physician request Representative  Tumor Block: A8 Comment(s): [None]    03/30/2022 Surgery   Preop Diagnosis: Pelvic mass, malignant ascites   Postoperative Diagnosis: at least stage IIB ovarian cancer, suspected high grade serous by frozen section   Surgery: Diagnostic laparoscopy, right salpingo-oophorectomy, peritoneal biopsies, infra-colic omentectomy    Surgeon:  Valarie Cones MD    Operative findings: On exam under anesthesia, somewhat mobile mass appreciated in the cul-de-sac to the right.  On agnostic laparoscopy, normal upper abdominal findings other than moderate volume ascites.  Normal-appearing peritoneum, omentum, small bowel.  Right adnexa enlarged with a mass measuring approximately 8-10 cm.  No carcinomatosis appreciated.  Given this, decision made to convert to an open procedure for tumor debulking.  Approximately 5 L of yellow-tinged ascites was removed upon intra-abdominal entry.  Normal upper abdominal survey including smooth diaphragm and liver, stomach, and omentum.  The small bowel was run from the cecum to the ligament of Treitz with no abnormalities or carcinomatosis noted.  Right ovary replaced by an approximately 8 cm smooth mass, dilated and enlarged fallopian tube.  The mass itself was adherent to the right pelvic sidewall, sigmoid colon, and densely adherent to the anterior cul-de-sac/bladder peritoneum.  Frozen section consistent with invasive carcinoma, likely high-grade.  Left tube and ovary were surgically absent.  Sigmoid colon with adhesions to the left pelvic sidewall, left aspect of the  bladder and the left vaginal cuff.  Somewhat redundant sigmoid which looped over on itself secondary to these adhesions.  Numerous diverticula noted of the sigmoid colon as well as the transverse colon.  The end of surgery, an R0 resection was accomplished.     04/13/2022 Cancer Staging   Staging form: Ovary, Fallopian Tube, and Primary Peritoneal Carcinoma, AJCC 8th Edition - Pathologic stage from 04/13/2022: Stage  II (pT2, pN0, cM0) - Signed by [name], MD on 04/13/2022 Stage prefix: Initial diagnosis   04/26/2022 -  Chemotherapy   Patient is on Treatment Plan : OVARIAN Carboplatin (AUC 6) + Paclitaxel (175) q21d X 6 Cycles     05/17/2022 Tumor Marker   Patient's tumor was tested for the following markers: CA-125. Results of the tumor marker test revealed 12.7.   06/06/2022 Genetic Testing   Germline: Negative [facility] Multi-Cancer +RNA Panel.  Report date is 06/06/2022.   The Multi-Cancer + RNA Panel offered by [facility] includes sequencing and/or deletion/duplication analysis of the following 70 genes:  AIP*, ALK, APC*, ATM*, AXIN2*, BAP1*, BARD1*, BLM*, BMPR1A*, BRCA1*, BRCA2*, BRIP1*, CDC73*, CDH1*, CDK4, CDKN1B*, CDKN2A, CHEK2*, CTNNA1*, DICER1*, EPCAM (del/dup only), EGFR, FH*, FLCN*, GREM1 (promoter dup only), HOXB13, KIT, LZTR1, MAX*, MBD4, MEN1*, MET, MITF, MLH1*, MSH2*, MSH3*, MSH6*, MUTYH*, NF1*, NF2*, NTHL1*, PALB2*, PDGFRA, PMS2*, POLD1*, POLE*, POT1*, PRKAR1A*, PTCH1*, PTEN*, RAD51C*, RAD51D*, RB1*, RET, SDHA* (sequencing only), SDHAF2*, SDHB*, SDHC*, SDHD*, SMAD4*, SMARCA4*, SMARCB1*, SMARCE1*, STK11*, SUFU*, TMEM127*, TP53*, TSC1*, TSC2*, VHL*. RNA analysis is performed for * genes.  Somatic: Positive genomic instability score (GIS) of 62 through [facility].  No pathogenic variants detected in BRCA1/2.  Report date is June 10, 2022.   [facility] CDx includes sequencing and large rearrangement analysis of BRCA1/2 and genomic instability status through loss of heterozygosity (LOH), telomeric allelic imbalance (TAI) and large-scale state transitions (LST).     06/08/2022 Tumor Marker   Patient's tumor was tested for the following markers: Ca-125. Results of the tumor marker test revealed 16.5.   06/13/2022 - 06/17/2022 Hospital Admission   The patient was admitted to the hospital and found to have diarrhea, diverticulitis and pancytopenia   06/13/2022 Imaging   Colonic  diverticulosis. Inflammatory stranding around the mid sigmoid colon compatible with active diverticulitis.   Interval resection of the previously seen lower pelvic solid and cystic mass and resolution of ascites.   Aortic atherosclerosis.   Severely atrophic left kidney, stable since prior study.   06/30/2022 Tumor Marker   Patient's tumor was tested for the following markers: CA-125. Results of the tumor marker test revealed 15.   07/21/2022 Tumor Marker   Patient's tumor was tested for the following markers: CA-125. Results of the tumor marker test revealed 11.4.     PHYSICAL EXAMINATION: ECOG PERFORMANCE STATUS: 1 - Symptomatic but completely ambulatory  Vitals:   08/10/22 0919  BP: (!) 155/77  Pulse: 84  Resp: 18  Temp: 98 F (36.7 C)  SpO2: 100%   Filed Weights   08/10/22 0919  Weight: 151 lb (68.5 kg)    GENERAL:alert, no distress and comfortable  NEURO: alert & oriented x 3 with fluent speech, no focal motor/sensory deficits  LABORATORY DATA:  I have reviewed the data as listed    Component Value Date/Time   NA 137 08/10/2022 0857   NA 134 06/01/2022 0848   K 4.2 08/10/2022 0857   CL 103 08/10/2022 0857   CO2 25 08/10/2022 0857   GLUCOSE 205 (H) 08/10/2022 0857  BUN 24 (H) 08/10/2022 0857   BUN 7 (L) 06/01/2022 0848   CREATININE 0.93 08/10/2022 0857   CREATININE 0.99 02/05/2013 0757   CALCIUM 9.5 08/10/2022 0857   PROT 7.1 08/10/2022 0857   PROT 6.0 06/01/2022 0848   ALBUMIN 3.7 08/10/2022 0857   ALBUMIN 3.6 (L) 06/01/2022 0848   AST 12 (L) 08/10/2022 0857   ALT 11 08/10/2022 0857   ALKPHOS 58 08/10/2022 0857   BILITOT 0.3 08/10/2022 0857   GFRNONAA >60 08/10/2022 0857   GFRNONAA 59 (L) 02/05/2013 0757   GFRAA 61 05/20/2020 0850   GFRAA 68 02/05/2013 0757    No results found for: "SPEP", "UPEP"  Lab Results  Component Value Date   WBC 10.7 (H) 08/10/2022   NEUTROABS 9.8 (H) 08/10/2022   HGB 9.0 (L) 08/10/2022   HCT 26.4 (L) 08/10/2022    MCV 97.8 08/10/2022   PLT 192 08/10/2022      Chemistry      Component Value Date/Time   NA 137 08/10/2022 0857   NA 134 06/01/2022 0848   K 4.2 08/10/2022 0857   CL 103 08/10/2022 0857   CO2 25 08/10/2022 0857   BUN 24 (H) 08/10/2022 0857   BUN 7 (L) 06/01/2022 0848   CREATININE 0.93 08/10/2022 0857   CREATININE 0.99 02/05/2013 0757      Component Value Date/Time   CALCIUM 9.5 08/10/2022 0857   ALKPHOS 58 08/10/2022 0857   AST 12 (L) 08/10/2022 0857   ALT 11 08/10/2022 0857   BILITOT 0.3 08/10/2022 0857

## 2022-08-10 NOTE — Telephone Encounter (Signed)
Oral Oncology Patient Advocate Encounter   Received notification that assistance for Lynparza has been approved through [employer].  This will allow the patient to receive their medication for $0.  [employer] phone number [phone].   Kerrigan Glendening, CPhT-Adv Oncology Pharmacy Patient Advocate [facility] Direct Number: [phone]  Fax: [phone]    

## 2022-08-10 NOTE — Assessment & Plan Note (Signed)
She will complete last cycle of treatment today Moving forward, she will have repeat imaging study next month If her counts recover by then, we will start her on olaparib for maintenance treatment due to presence of genomic instability Given her history of severe pancytopenia, I will start her on a lower dose of olaparib

## 2022-08-11 ENCOUNTER — Telehealth: Payer: Self-pay

## 2022-08-11 NOTE — Telephone Encounter (Signed)
Oral Oncology Pharmacist Encounter  Received new prescription for olaparib for the treatment of ovarian epithelial cancer as maintenance treatment, with planned duration until disease progression or unacceptable toxicity occurs.  Labs from 08/10/22 assessed, appropriate to initiate treatment. No interventions needed. Prescription dose and frequency assessed for appropriateness. Due to history of severe pancytopenia, initiating at lower dose is appropriate.   Current medication list in Epic reviewed, no DDIs were identified.  Evaluated chart and no patient barriers to medication adherence noted.   Patient agreement for treatment documented in MD note on 08/10/22.  Prescription has been e-scribed to the [facility] for benefits analysis and approval.  Oral Oncology Clinic will continue to follow for insurance authorization, copayment issues, initial counseling and start date.  Sinda Du, PharmD Candidate 08/11/2022 11:14 AM Oral Oncology Clinic [phone]

## 2022-08-12 ENCOUNTER — Other Ambulatory Visit: Payer: Self-pay

## 2022-08-12 ENCOUNTER — Inpatient Hospital Stay: Payer: Medicare Other

## 2022-08-12 ENCOUNTER — Telehealth: Payer: Self-pay

## 2022-08-12 DIAGNOSIS — Z79899 Other long term (current) drug therapy: Secondary | ICD-10-CM | POA: Diagnosis not present

## 2022-08-12 DIAGNOSIS — N1831 Chronic kidney disease, stage 3a: Secondary | ICD-10-CM | POA: Diagnosis not present

## 2022-08-12 DIAGNOSIS — C561 Malignant neoplasm of right ovary: Secondary | ICD-10-CM | POA: Diagnosis not present

## 2022-08-12 DIAGNOSIS — R300 Dysuria: Secondary | ICD-10-CM

## 2022-08-12 DIAGNOSIS — Z5111 Encounter for antineoplastic chemotherapy: Secondary | ICD-10-CM | POA: Diagnosis not present

## 2022-08-12 DIAGNOSIS — R5383 Other fatigue: Secondary | ICD-10-CM | POA: Diagnosis not present

## 2022-08-12 LAB — URINALYSIS, COMPLETE (UACMP) WITH MICROSCOPIC
Bilirubin Urine: NEGATIVE
Glucose, UA: NEGATIVE mg/dL
Ketones, ur: NEGATIVE mg/dL
Nitrite: NEGATIVE
Protein, ur: 100 mg/dL — AB
RBC / HPF: >50 RBC/hpf (ref 0–5)
Specific Gravity, Urine: 1.004 — ABNORMAL LOW (ref 1.005–1.030)
WBC, UA: >50 WBC/hpf (ref 0–5)
pH: 6 (ref 5.0–8.0)

## 2022-08-12 LAB — CA 125: Cancer Antigen (CA) 125: 9.7 U/mL (ref 0.0–38.1)

## 2022-08-12 NOTE — Telephone Encounter (Signed)
Returned her call. She is complaining of a little burning with urination that started today. She is passing some air with urination that started today. She is worried about the weekend and wants to come in for UA today. Added lab appt at 3:30 pm.

## 2022-08-13 ENCOUNTER — Telehealth: Payer: Self-pay

## 2022-08-13 LAB — URINE CULTURE: Culture: NO GROWTH

## 2022-08-13 NOTE — Telephone Encounter (Signed)
Called and given results of urine culture. She verbalized understanding.  She is concerned about what is going on. She is still passing air when she urinates and seeing bubbles in her urine. She drinks lots of water. She is having urgency feeling when she urinates due to drink so much. She is asking what Dr. Bertis Ruddy recommends that she do.

## 2022-08-13 NOTE — Telephone Encounter (Signed)
-----   Message from [name], MD sent at 08/13/2022  2:16 PM EDT ----- Urine culture is neg

## 2022-08-13 NOTE — Telephone Encounter (Signed)
Called back given below message and she will wait for the scheduled CT scan. She will call the office back if she changes her mind about seeing the GYN Energy manager.

## 2022-08-13 NOTE — Telephone Encounter (Signed)
We can get her seen by GYN ONC surgeon for eval or wait for CT

## 2022-08-14 ENCOUNTER — Other Ambulatory Visit: Payer: Self-pay

## 2022-08-14 ENCOUNTER — Emergency Department (HOSPITAL_BASED_OUTPATIENT_CLINIC_OR_DEPARTMENT_OTHER)
Admission: EM | Admit: 2022-08-14 | Discharge: 2022-08-15 | Disposition: A | Discharge status: Home / Self Care | Payer: Medicare Other | Attending: Emergency Medicine | Admitting: Emergency Medicine

## 2022-08-14 ENCOUNTER — Emergency Department (HOSPITAL_BASED_OUTPATIENT_CLINIC_OR_DEPARTMENT_OTHER): Payer: Medicare Other

## 2022-08-14 DIAGNOSIS — N3289 Other specified disorders of bladder: Secondary | ICD-10-CM | POA: Diagnosis not present

## 2022-08-14 DIAGNOSIS — Z7982 Long term (current) use of aspirin: Secondary | ICD-10-CM | POA: Diagnosis not present

## 2022-08-14 DIAGNOSIS — N321 Vesicointestinal fistula: Secondary | ICD-10-CM | POA: Diagnosis not present

## 2022-08-14 DIAGNOSIS — Z91040 Latex allergy status: Secondary | ICD-10-CM | POA: Insufficient documentation

## 2022-08-14 DIAGNOSIS — Z8543 Personal history of malignant neoplasm of ovary: Secondary | ICD-10-CM | POA: Diagnosis not present

## 2022-08-14 DIAGNOSIS — K6389 Other specified diseases of intestine: Secondary | ICD-10-CM | POA: Diagnosis not present

## 2022-08-14 DIAGNOSIS — E039 Hypothyroidism, unspecified: Secondary | ICD-10-CM | POA: Diagnosis not present

## 2022-08-14 DIAGNOSIS — I10 Essential (primary) hypertension: Secondary | ICD-10-CM | POA: Diagnosis not present

## 2022-08-14 DIAGNOSIS — N898 Other specified noninflammatory disorders of vagina: Secondary | ICD-10-CM | POA: Diagnosis not present

## 2022-08-14 DIAGNOSIS — Z79899 Other long term (current) drug therapy: Secondary | ICD-10-CM | POA: Diagnosis not present

## 2022-08-14 DIAGNOSIS — N823 Fistula of vagina to large intestine: Secondary | ICD-10-CM | POA: Diagnosis not present

## 2022-08-14 DIAGNOSIS — N322 Vesical fistula, not elsewhere classified: Secondary | ICD-10-CM

## 2022-08-14 DIAGNOSIS — N828 Other female genital tract fistulae: Secondary | ICD-10-CM

## 2022-08-14 DIAGNOSIS — C569 Malignant neoplasm of unspecified ovary: Secondary | ICD-10-CM

## 2022-08-14 LAB — COMPREHENSIVE METABOLIC PANEL
ALT: 11 U/L (ref 0–44)
AST: 12 U/L — ABNORMAL LOW (ref 15–41)
Albumin: 3.7 g/dL (ref 3.5–5.0)
Alkaline Phosphatase: 50 U/L (ref 38–126)
Anion gap: 10 (ref 5–15)
BUN: 32 mg/dL — ABNORMAL HIGH (ref 8–23)
CO2: 23 mmol/L (ref 22–32)
Calcium: 8.8 mg/dL — ABNORMAL LOW (ref 8.9–10.3)
Chloride: 100 mmol/L (ref 98–111)
Creatinine, Ser: 0.79 mg/dL (ref 0.44–1.00)
GFR, Estimated: >60 mL/min (ref >60)
Glucose, Bld: 123 mg/dL — ABNORMAL HIGH (ref 70–99)
Potassium: 4.2 mmol/L (ref 3.5–5.1)
Sodium: 133 mmol/L — ABNORMAL LOW (ref 135–145)
Total Bilirubin: 0.7 mg/dL (ref 0.3–1.2)
Total Protein: 6.2 g/dL — ABNORMAL LOW (ref 6.5–8.1)

## 2022-08-14 LAB — URINALYSIS, ROUTINE W REFLEX MICROSCOPIC
Bacteria, UA: NONE SEEN
Bilirubin Urine: NEGATIVE
Glucose, UA: NEGATIVE mg/dL
Ketones, ur: NEGATIVE mg/dL
Nitrite: NEGATIVE
Protein, ur: 100 mg/dL — AB
RBC / HPF: >50 RBC/hpf (ref 0–5)
Specific Gravity, Urine: 1.018 (ref 1.005–1.030)
WBC, UA: >50 WBC/hpf (ref 0–5)
pH: 6.5 (ref 5.0–8.0)

## 2022-08-14 LAB — CBC WITH DIFFERENTIAL/PLATELET
Abs Immature Granulocytes: 0.06 10*3/uL (ref 0.00–0.07)
Basophils Absolute: 0 10*3/uL (ref 0.0–0.1)
Basophils Relative: 1 %
Eosinophils Absolute: 0 10*3/uL (ref 0.0–0.5)
Eosinophils Relative: 0 %
HCT: 25.9 % — ABNORMAL LOW (ref 36.0–46.0)
Hemoglobin: 8.8 g/dL — ABNORMAL LOW (ref 12.0–15.0)
Immature Granulocytes: 1 %
Lymphocytes Relative: 6 %
Lymphs Abs: 0.4 10*3/uL — ABNORMAL LOW (ref 0.7–4.0)
MCH: 32.7 pg (ref 26.0–34.0)
MCHC: 34 g/dL (ref 30.0–36.0)
MCV: 96.3 fL (ref 80.0–100.0)
Monocytes Absolute: 0.1 10*3/uL (ref 0.1–1.0)
Monocytes Relative: 1 %
Neutro Abs: 6.9 10*3/uL (ref 1.7–7.7)
Neutrophils Relative %: 91 %
Platelets: 203 10*3/uL (ref 150–400)
RBC: 2.69 MIL/uL — ABNORMAL LOW (ref 3.87–5.11)
RDW: 16.5 % — ABNORMAL HIGH (ref 11.5–15.5)
WBC: 7.5 10*3/uL (ref 4.0–10.5)
nRBC: 0 % (ref 0.0–0.2)

## 2022-08-14 LAB — WET PREP, GENITAL
Clue Cells Wet Prep HPF POC: NONE SEEN
Sperm: NONE SEEN
Trich, Wet Prep: NONE SEEN
WBC, Wet Prep HPF POC: >=10 — AB (ref <10)

## 2022-08-14 LAB — LACTIC ACID, PLASMA: Lactic Acid, Venous: 1.3 mmol/L (ref 0.5–1.9)

## 2022-08-14 MED ORDER — CIPROFLOXACIN HCL 500 MG PO TABS
500.0000 mg | ORAL_TABLET | Freq: Once | ORAL | Status: AC
  Administered 2022-08-14: 500 mg via ORAL
  Filled 2022-08-14: qty 1

## 2022-08-14 MED ORDER — ONDANSETRON 4 MG PO TBDP
4.0000 mg | ORAL_TABLET | Freq: Once | ORAL | Status: AC
  Administered 2022-08-14: 4 mg via ORAL
  Filled 2022-08-14: qty 1

## 2022-08-14 MED ORDER — HEPARIN SOD (PORK) LOCK FLUSH 100 UNIT/ML IV SOLN
500.0000 [IU] | Freq: Once | INTRAVENOUS | Status: AC
  Administered 2022-08-14: 500 [IU]
  Filled 2022-08-14: qty 5

## 2022-08-14 MED ORDER — CIPROFLOXACIN HCL 500 MG PO TABS: 500.0000 mg | ORAL_TABLET | Freq: Two times a day (BID) | ORAL | 0 refills | Status: DC

## 2022-08-14 MED ORDER — METRONIDAZOLE 500 MG PO TABS
500.0000 mg | ORAL_TABLET | Freq: Once | ORAL | Status: AC
  Administered 2022-08-14: 500 mg via ORAL
  Filled 2022-08-14: qty 1

## 2022-08-14 MED ORDER — DIPHENHYDRAMINE HCL 25 MG PO CAPS: 50.0000 mg | ORAL_CAPSULE | Freq: Once | ORAL | Status: DC

## 2022-08-14 MED ORDER — METHYLPREDNISOLONE SODIUM SUCC 40 MG IJ SOLR: 40.0000 mg | Freq: Once | INTRAMUSCULAR | Status: DC

## 2022-08-14 MED ORDER — HYDROCODONE-ACETAMINOPHEN 5-325 MG PO TABS
1.0000 | ORAL_TABLET | Freq: Once | ORAL | Status: AC
  Administered 2022-08-14: 1 via ORAL
  Filled 2022-08-14: qty 1

## 2022-08-14 MED ORDER — LACTATED RINGERS IV SOLN: INTRAVENOUS | Status: DC

## 2022-08-14 MED ORDER — SODIUM CHLORIDE 0.9 % IV BOLUS
1000.0000 mL | Freq: Once | INTRAVENOUS | Status: AC
  Administered 2022-08-14: 1000 mL via INTRAVENOUS

## 2022-08-14 MED ORDER — METRONIDAZOLE 500 MG PO TABS: 500.0000 mg | ORAL_TABLET | Freq: Two times a day (BID) | ORAL | 0 refills | Status: DC

## 2022-08-14 MED ORDER — DIPHENHYDRAMINE HCL 50 MG/ML IJ SOLN: 50.0000 mg | Freq: Once | INTRAMUSCULAR | Status: DC

## 2022-08-14 MED ORDER — HYDROCODONE-ACETAMINOPHEN 5-325 MG PO TABS: 1.0000 | ORAL_TABLET | Freq: Four times a day (QID) | ORAL | 0 refills | Status: DC | PRN

## 2022-08-14 NOTE — ED Notes (Signed)
Unable to collect blood culture of off implanted port.

## 2022-08-14 NOTE — ED Provider Notes (Signed)
F/U CT. S/p ovarian cancer. Vaginal d/c ro abscess.  Physical Exam  BP 90/71   Pulse (!) 102   Temp 98.6 F (37 C)   Resp 20   Ht 5\' 7"  (1.702 m)   Wt 65.8 kg   SpO2 100%   BMI 22.71 kg/m   Physical Exam  Procedures  Procedures  ED Course / MDM   Clinical Course as of 08/14/22 2134  Sat Aug 14, 2022  1522 Patient signed out to Dr. Donnald Garre pending labs and CT. [VK]    Clinical Course User Index [VK] Rexford Maus, DO   Medical Decision Making Amount and/or Complexity of Data Reviewed Labs: ordered. Radiology: ordered.  Risk Prescription drug management.     Consult: Reviewed with Dr. Alvester Morin gynecologic.  We reviewed the CT findings and lab work.  At this point with patient nontoxic and no signs of systemic illness, Plan will be for oral antibiotics with Cipro Flagyl combination and outpatient follow-up.  Dr. Alvester Morin will forward this to Dr. Pricilla Holm as well.  Consult: Reviewed with Dr. Lacy Duverney.  We reviewed the CT findings and patient's history.  At this time agrees with the plan for Cipro Flagyl and outpatient follow-up with colorectal.  Patient can be seen to discuss options for management.   Patient reports her preference is to go home if possible.  She does not have constitutional symptoms.  She is not having vomiting.  Patient has tolerated oral Cipro, Flagyl and hydrocodone.  We reviewed careful return precautions.  Plan will be for discharge with close follow-up with her specialist.    Arby Barrette, MD 08/14/22 2339

## 2022-08-14 NOTE — ED Triage Notes (Signed)
Patient arrives with complaints of increased vaginal discharge x1 week. Patient states that symptoms started after chemo treatment and worsened the past few days. Reports no pain

## 2022-08-14 NOTE — ED Provider Notes (Signed)
[place] [facility] DEPARTMENT AT [facility] Provider Note   CSN: [id number] Arrival date & time: 08/14/22  1241     History  Chief Complaint  Patient presents with   Vaginal Discharge    April Weber is a 77 y.o. female.  Patient is a 77 year old female with a past medical history of ovarian cancer status post completion of chemotherapy last week, hypothyroidism and hypertension presenting to the emergency department with vaginal discharge.  Patient states that on Wednesday she started to notice some air in her urine.  She denies any dysuria or hematuria.  She states that when she woke up this morning went to the bathroom and noticed thick green appearing vaginal discharge.  She states that after wiping she then again felt it continued to drain in her underwear.  She continues to deny any dysuria or hematuria, fevers or chills, nausea or vomiting, diarrhea or constipation.  She states that her last operation for her cancer was in November.  The history is provided by the patient and a relative.  Vaginal Discharge      Home Medications Prior to Admission medications   Medication Sig Start Date End Date Taking? Authorizing Provider  acetaminophen (TYLENOL) 650 MG CR tablet Take 1,300 mg by mouth as needed for pain.    [provider]  aspirin EC 81 MG tablet Take 81 mg by mouth at bedtime. Swallow whole.    [provider]  atenolol (TENORMIN) 25 MG tablet Take 0.5 tablets (12.5 mg total) by mouth 2 (two) times daily. 06/01/22   Raliegh IpGottschalk, Ashly M, DO  Calcium Carb-Cholecalciferol (CALCIUM + VITAMIN D3 PO) Take 1 tablet by mouth 2 (two) times daily.    [provider]  dexamethasone (DECADRON) 4 MG tablet Take 2 tabs at the day before and 1 tab daily for 2 days after chemotherapy Patient taking differently: Take 4-8 mg by mouth See admin instructions. Take 8 mg the day before chemo and 4 mg daily for 2 days after chemotherapy 05/17/22   Artis DelayGorsuch,  Ni, MD  EPINEPHrine 0.3 mg/0.3 mL IJ SOAJ injection Inject 0.3 mg into the muscle as needed for anaphylaxis. 05/24/22   Daphine DeutscherMartin, Mary-Margaret, FNP  levothyroxine (SYNTHROID) 100 MCG tablet Take 1 tablet (100 mcg total) by mouth in the morning. 06/01/22   Raliegh IpGottschalk, Ashly M, DO  lidocaine-prilocaine (EMLA) cream Apply to affected area once Patient taking differently: Apply 1 Application topically as needed (port access). 04/20/22   Artis DelayGorsuch, Ni, MD  loratadine (CLARITIN) 10 MG tablet Take 10 mg by mouth daily as needed for allergies.    [provider]  Multiple Vitamin (MULTIVITAMIN) tablet Take 1 tablet by mouth in the morning.    [provider]  olaparib (LYNPARZA) 100 MG tablet Take 1 tablet (100 mg total) by mouth 2 (two) times daily. Swallow whole. May take with food to decrease nausea and vomiting. 08/10/22   Artis DelayGorsuch, Ni, MD  ondansetron (ZOFRAN) 8 MG tablet Take 1 tablet (8 mg total) by mouth every 8 (eight) hours as needed for nausea or vomiting. Start on the third day after chemotherapy. Patient taking differently: Take 8 mg by mouth as needed for nausea or vomiting. 04/20/22   Artis DelayGorsuch, Ni, MD  promethazine (PHENERGAN) 12.5 MG tablet Take 12.5 mg by mouth every 6 (six) hours as needed for nausea or vomiting. Patient not sure of dose    [provider]      Allergies    Paclitaxel, Emend [fosaprepitant dimeglumine], Codeine,  Nsaids, Penicillins, Vibra-tab [doxycycline], Iodine, and Latex    Review of Systems   Review of Systems  Genitourinary:  Positive for vaginal discharge.    Physical Exam Updated Vital Signs BP 90/71   Pulse (!) 102   Temp 98.6 F (37 C)   Resp 20   Ht 5\' 7"  (1.702 m)   Wt 65.8 kg   SpO2 100%   BMI 22.71 kg/m  Physical Exam Vitals and nursing note reviewed. Exam conducted with a chaperone present.  Constitutional:      General: She is not in acute distress.    Appearance: Normal appearance.  HENT:     Head: Normocephalic and  atraumatic.     Nose: Nose normal.     Mouth/Throat:     Mouth: Mucous membranes are moist.     Pharynx: Oropharynx is clear.  Eyes:     Extraocular Movements: Extraocular movements intact.     Conjunctiva/sclera: Conjunctivae normal.  Cardiovascular:     Rate and Rhythm: Normal rate and regular rhythm.     Heart sounds: Normal heart sounds.  Pulmonary:     Effort: Pulmonary effort is normal.     Breath sounds: Normal breath sounds.  Abdominal:     General: Abdomen is flat.     Palpations: Abdomen is soft.     Tenderness: There is no abdominal tenderness.  Genitourinary:    Vagina: Vaginal discharge (Approximately 5 to 10 cc of yellow-green discharge, no significant tenderness) present.  Musculoskeletal:        General: Normal range of motion.     Cervical back: Normal range of motion and neck supple.  Skin:    General: Skin is warm and dry.  Neurological:     General: No focal deficit present.     Mental Status: She is alert and oriented to person, place, and time.  Psychiatric:        Mood and Affect: Mood normal.        Behavior: Behavior normal.     ED Results / Procedures / Treatments   Labs (all labs ordered are listed, but only abnormal results are displayed) Labs Reviewed  WET PREP, GENITAL - Abnormal; Notable for the following components:      Result Value   Yeast Wet Prep HPF POC PRESENT (*)    WBC, Wet Prep HPF POC >=10 (*)    All other components within normal limits  URINALYSIS, ROUTINE W REFLEX MICROSCOPIC - Abnormal; Notable for the following components:   Color, Urine ORANGE (*)    APPearance CLOUDY (*)    Hgb urine dipstick MODERATE (*)    Protein, ur 100 (*)    Leukocytes,Ua LARGE (*)    All other components within normal limits  COMPREHENSIVE METABOLIC PANEL  CBC WITH DIFFERENTIAL/PLATELET    EKG None  Radiology No results found.  Procedures Procedures    Medications Ordered in ED Medications  sodium chloride 0.9 % bolus 1,000 mL  (1,000 mLs Intravenous New Bag/Given 08/14/22 1456)    ED Course/ Medical Decision Making/ A&P Clinical Course as of 08/14/22 1523  Sat Aug 14, 2022  1522 Patient signed out to Dr. Donnald Garre pending labs and CT. [VK]    Clinical Course User Index [VK] Rexford Maus, DO                             Medical Decision Making This patient presents to the ED with chief  complaint(s) of vaginal discharge with pertinent past medical history of ovarian cancer s/p chemotherapy, HTN, hypothyroidism which further complicates the presenting complaint. The complaint involves an extensive differential diagnosis and also carries with it a high risk of complications and morbidity.    The differential diagnosis includes UTI, STI, BV, yeast, pelvic infection, sepsis  Additional history obtained: Additional history obtained from family Records reviewed outpatient oncology records  ED Course and Reassessment: Patient is not sexually active making STI unlikely, considering sepsis, abscess, fistula or other pelvic infection, UTI, yeast or BV.  She did have a wet prep performed in the urine and will have labs and CT of her abdomen and pelvis to further evaluate for deeper intra-abdominal infection.  Patient declined any pain medications at this time.  Independent labs interpretation:  The following labs were independently interpreted: UA with leuks, no bacteria, wet prep negative, labs otherwise pending  Independent visualization of imaging: - Pending     Amount and/or Complexity of Data Reviewed Labs: ordered. Radiology: ordered.  Risk Prescription drug management.          Final Clinical Impression(s) / ED Diagnoses Final diagnoses:  None    Rx / DC Orders ED Discharge Orders     None         Rexford Maus, DO 08/14/22 1523

## 2022-08-14 NOTE — Discharge Instructions (Signed)
1.  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 2.  You will need to follow-up with your gynecologist, Dr. Pricilla Holm and [facility] surgery for a colorectal specialist evaluation. 3.  You may take 1-2 Vicodin tablets every 6 hours as needed for pain control.  You may take your Zofran for nausea. 4.  Return to emergency department immediately if you get a fever, general weakness, vomiting or other concerning changes.

## 2022-08-14 NOTE — ED Notes (Signed)
Patient transported to CT 

## 2022-08-16 ENCOUNTER — Telehealth: Payer: Self-pay

## 2022-08-16 ENCOUNTER — Other Ambulatory Visit: Payer: Self-pay | Admitting: Hematology and Oncology

## 2022-08-16 ENCOUNTER — Encounter: Payer: Self-pay | Admitting: Oncology

## 2022-08-16 DIAGNOSIS — C561 Malignant neoplasm of right ovary: Secondary | ICD-10-CM

## 2022-08-16 LAB — GC/CHLAMYDIA PROBE AMP (~~LOC~~) NOT AT ARMC
Chlamydia: NEGATIVE
Comment: NEGATIVE
Comment: NORMAL
Neisseria Gonorrhea: NEGATIVE

## 2022-08-16 NOTE — Telephone Encounter (Signed)
Called April Weber and she said she is feeling "rotten."  She is having vaginal discharge especially when she stands up that runs down her legs.  She is going through multiple pads. She said it is brown, has a foul odor and is thick and sometimes gelatinous.  She thinks the discharge is vaginal because she is also having bowel movements that are soft.  She reports having a pressure feeling before having the discharge. She denies running a temperature and is able to eat and drink.  She reports feeling very tired.  Advised her that Dr. Pricilla Holm is aware of her ER visit and is in agreement with the plan for antibiotics and referral to the Colorectal surgeon.  Also advised that the CT scheduled for 09/09/22 has been canceled since she had the CT in the ER.

## 2022-08-16 NOTE — Telephone Encounter (Signed)
She called and left a message. She went to ER over the weekend and she needs possible surgery. She is asking if Dr. Bertis Ruddy can review her chart.

## 2022-08-16 NOTE — Telephone Encounter (Signed)
Called Pat back and advised her that we are sending in a referral to Dr. Maisie Fus at [facility] and also she will be scheduled to see Dr. Pricilla Holm.  She verbalized understanding and agreement.

## 2022-08-16 NOTE — Progress Notes (Signed)
Urgent referral faxed to [facility] - Dr. Maisie Fus.

## 2022-08-16 NOTE — Telephone Encounter (Signed)
I will review the scans with Dr. Pricilla Holm We may need to bring her in for pelvic exam We will call her

## 2022-08-17 ENCOUNTER — Telehealth: Payer: Self-pay | Admitting: *Deleted

## 2022-08-17 NOTE — Telephone Encounter (Signed)
     Patient  visit on 08/15/2022  at [facility]  was for treatment   Have you been able to follow up with your primary care physician?Patient is feeling bad but will see her dr soon as they get back with an appt   The patient was  able to obtain any needed medicine or equipment.  Are there diet recommendations that you are having difficulty following? NA  Patient expresses understanding of discharge instructions and education provided has no other needs at this time. Yes   Alois Cliche -Berneda Rose [facility] [place], Population Health [phone] [address] [place] , [place] [address] Email : [email or web address]

## 2022-08-18 ENCOUNTER — Telehealth: Payer: Self-pay | Admitting: Oncology

## 2022-08-18 NOTE — Telephone Encounter (Signed)
Called [facility] to check on referral.  Per April Weber, Referral Coordinator, they will hopefully review it today.  Also refaxed referral to make sure they have everything.  Also called April Weber and gave her an update that [facility] should be calling her soon for an appointment.  Advised her that Dr. Pricilla Holm believes her symptoms are related to diverticulitis and she would like her to see [facility] as well.  April Weber verbalized understanding and said her drainage has decreased.  She still is having pressure but this is relieved with extra strength tylenol.

## 2022-08-19 ENCOUNTER — Telehealth: Payer: Self-pay

## 2022-08-19 NOTE — Telephone Encounter (Signed)
Returned her call. She received a letter in the mail from insurance that her copay for April Weber would be $3000. Told her that I can see a note in the chart that assistance for April Weber has been approved for $0 through thru [employer].

## 2022-08-23 ENCOUNTER — Telehealth: Payer: Self-pay

## 2022-08-23 ENCOUNTER — Emergency Department (HOSPITAL_BASED_OUTPATIENT_CLINIC_OR_DEPARTMENT_OTHER): Payer: Medicare Other

## 2022-08-23 ENCOUNTER — Emergency Department (HOSPITAL_BASED_OUTPATIENT_CLINIC_OR_DEPARTMENT_OTHER)
Admission: EM | Admit: 2022-08-23 | Discharge: 2022-08-24 | Disposition: A | Discharge status: Home / Self Care | Payer: Medicare Other | Attending: Emergency Medicine | Admitting: Emergency Medicine

## 2022-08-23 ENCOUNTER — Other Ambulatory Visit: Payer: Self-pay

## 2022-08-23 DIAGNOSIS — N1831 Chronic kidney disease, stage 3a: Secondary | ICD-10-CM | POA: Diagnosis not present

## 2022-08-23 DIAGNOSIS — Z853 Personal history of malignant neoplasm of breast: Secondary | ICD-10-CM | POA: Diagnosis not present

## 2022-08-23 DIAGNOSIS — Z87891 Personal history of nicotine dependence: Secondary | ICD-10-CM | POA: Diagnosis not present

## 2022-08-23 DIAGNOSIS — Z91040 Latex allergy status: Secondary | ICD-10-CM | POA: Insufficient documentation

## 2022-08-23 DIAGNOSIS — N321 Vesicointestinal fistula: Secondary | ICD-10-CM

## 2022-08-23 DIAGNOSIS — Z7982 Long term (current) use of aspirin: Secondary | ICD-10-CM | POA: Diagnosis not present

## 2022-08-23 DIAGNOSIS — I129 Hypertensive chronic kidney disease with stage 1 through stage 4 chronic kidney disease, or unspecified chronic kidney disease: Secondary | ICD-10-CM | POA: Insufficient documentation

## 2022-08-23 DIAGNOSIS — R109 Unspecified abdominal pain: Secondary | ICD-10-CM

## 2022-08-23 DIAGNOSIS — K5730 Diverticulosis of large intestine without perforation or abscess without bleeding: Secondary | ICD-10-CM | POA: Diagnosis not present

## 2022-08-23 DIAGNOSIS — E039 Hypothyroidism, unspecified: Secondary | ICD-10-CM | POA: Diagnosis not present

## 2022-08-23 DIAGNOSIS — R1030 Lower abdominal pain, unspecified: Secondary | ICD-10-CM | POA: Diagnosis present

## 2022-08-23 DIAGNOSIS — N2889 Other specified disorders of kidney and ureter: Secondary | ICD-10-CM | POA: Diagnosis not present

## 2022-08-23 DIAGNOSIS — D649 Anemia, unspecified: Secondary | ICD-10-CM | POA: Diagnosis not present

## 2022-08-23 DIAGNOSIS — N390 Urinary tract infection, site not specified: Secondary | ICD-10-CM

## 2022-08-23 LAB — COMPREHENSIVE METABOLIC PANEL
ALT: 10 U/L (ref 0–44)
AST: 13 U/L — ABNORMAL LOW (ref 15–41)
Albumin: 3.5 g/dL (ref 3.5–5.0)
Alkaline Phosphatase: 37 U/L — ABNORMAL LOW (ref 38–126)
Anion gap: 10 (ref 5–15)
BUN: 16 mg/dL (ref 8–23)
CO2: 21 mmol/L — ABNORMAL LOW (ref 22–32)
Calcium: 8.8 mg/dL — ABNORMAL LOW (ref 8.9–10.3)
Chloride: 98 mmol/L (ref 98–111)
Creatinine, Ser: 0.9 mg/dL (ref 0.44–1.00)
GFR, Estimated: >60 mL/min (ref >60)
Glucose, Bld: 98 mg/dL (ref 70–99)
Potassium: 4.2 mmol/L (ref 3.5–5.1)
Sodium: 129 mmol/L — ABNORMAL LOW (ref 135–145)
Total Bilirubin: 0.4 mg/dL (ref 0.3–1.2)
Total Protein: 5.7 g/dL — ABNORMAL LOW (ref 6.5–8.1)

## 2022-08-23 LAB — LIPASE, BLOOD: Lipase: 13 U/L (ref 11–51)

## 2022-08-23 LAB — CBC WITH DIFFERENTIAL/PLATELET
Abs Immature Granulocytes: 0.04 10*3/uL (ref 0.00–0.07)
Basophils Absolute: 0 10*3/uL (ref 0.0–0.1)
Basophils Relative: 0 %
Eosinophils Absolute: 0 10*3/uL (ref 0.0–0.5)
Eosinophils Relative: 0 %
HCT: 21.5 % — ABNORMAL LOW (ref 36.0–46.0)
Hemoglobin: 7.3 g/dL — ABNORMAL LOW (ref 12.0–15.0)
Immature Granulocytes: 1 %
Lymphocytes Relative: 14 %
Lymphs Abs: 0.7 10*3/uL (ref 0.7–4.0)
MCH: 32.6 pg (ref 26.0–34.0)
MCHC: 34 g/dL (ref 30.0–36.0)
MCV: 96 fL (ref 80.0–100.0)
Monocytes Absolute: 0.6 10*3/uL (ref 0.1–1.0)
Monocytes Relative: 12 %
Neutro Abs: 3.7 10*3/uL (ref 1.7–7.7)
Neutrophils Relative %: 73 %
Platelets: 186 10*3/uL (ref 150–400)
RBC: 2.24 MIL/uL — ABNORMAL LOW (ref 3.87–5.11)
RDW: 16.6 % — ABNORMAL HIGH (ref 11.5–15.5)
WBC: 5.1 10*3/uL (ref 4.0–10.5)
nRBC: 0 % (ref 0.0–0.2)

## 2022-08-23 LAB — URINALYSIS, ROUTINE W REFLEX MICROSCOPIC
Bilirubin Urine: NEGATIVE
Glucose, UA: NEGATIVE mg/dL
Ketones, ur: NEGATIVE mg/dL
Nitrite: NEGATIVE
Protein, ur: 30 mg/dL — AB
Specific Gravity, Urine: 1.008 (ref 1.005–1.030)
WBC, UA: >50 WBC/hpf (ref 0–5)
pH: 6 (ref 5.0–8.0)

## 2022-08-23 MED ORDER — ONDANSETRON 4 MG PO TBDP
4.0000 mg | ORAL_TABLET | Freq: Once | ORAL | Status: AC
  Administered 2022-08-23: 4 mg via ORAL
  Filled 2022-08-23: qty 1

## 2022-08-23 MED ORDER — HYDROCODONE-ACETAMINOPHEN 5-325 MG PO TABS
1.0000 | ORAL_TABLET | Freq: Once | ORAL | Status: AC
  Administered 2022-08-23: 1 via ORAL
  Filled 2022-08-23: qty 1

## 2022-08-23 MED ORDER — SODIUM CHLORIDE 0.9 % IV BOLUS
1000.0000 mL | Freq: Once | INTRAVENOUS | Status: AC
  Administered 2022-08-23: 1000 mL via INTRAVENOUS

## 2022-08-23 NOTE — Telephone Encounter (Signed)
Returned her call. She completed Cipro/ Flagyl on Saturday. She is having constant leaking of stool from rectum while on the antibiotic. Her rectal are is very uncomfortable. When it is very uncomfortable she takes a Vicodin.  She is asking if the stool leakage does not get better today is it okay to take imodium? 1/2 tablet to see if it get better.

## 2022-08-23 NOTE — ED Notes (Signed)
Bladder scan volume: 

## 2022-08-23 NOTE — ED Provider Notes (Incomplete)
[place] EMERGENCY DEPARTMENT AT [facility] Provider Note  CSN: [id number] Arrival date & time: 08/23/22 1834  Chief Complaint(s) Urinary Retention  HPI April Weber is a 77 y.o. female with past medical history as below, significant for CKD, HLD, HTN, colovaginal/colovesicular fistula, who presents to the ED with complaint of abd pain.  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  Difficulty urinating.  Worsening abdominal pain suprapubic.  Took tramadol earlier today with minimal relief of her symptoms, did not take her Norco but reports it does work much better than tramadol.  Nausea and vomiting yesterday and earlier today which is since resolved.  Intermittent diarrhea that she attributes to her recent antibiotic use of Cipro and Flagyl which she completed yesterday.  Dr Bertis Ruddy oncology, current chemo.   Past Medical History Past Medical History:  Diagnosis Date   Arthritis    Breast cancer 1980   RIGHT   Chronic kidney disease    has one kidney   DVT (deep venous thrombosis)    on RIGHT leg-hx of   Dyspnea    Hyperlipidemia    on meds   Hypertension    on meds   Osteopenia    Peripheral vascular disease    PONV (postoperative nausea and vomiting)    Seasonal allergies    Thyroid disease    on meds   Patient Active Problem List   Diagnosis Date Noted   Neutropenic fever 06/15/2022   Neutropenia 06/14/2022   Lymphocytopenia 06/14/2022   Sigmoid diverticulitis 06/13/2022   Acute diverticulitis 06/13/2022   Pancytopenia 06/13/2022   Genetic testing 06/09/2022   Port-A-Cath in place 06/07/2022   Anemia due to antineoplastic chemotherapy 06/07/2022   Elevated liver enzymes 04/20/2022   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   Acute renal failure superimposed on stage 3a chronic kidney disease 03/17/2022    Hyponatremia 03/17/2022   Vaginal dryness 10/22/2019   Vaginal atrophy 10/22/2019   Stage 3a chronic kidney disease 02/09/2019   History of mastectomy 06/23/2018   Dupuytren contracture 02/20/2018   Hand pain 02/13/2018   Need for prophylactic vaccination and inoculation against influenza 02/13/2013   Hypothyroidism 08/04/2012   Osteopenia with high risk of fracture 08/12/2010   History of breast cancer 08/12/2010   Hyperlipidemia 08/12/2010   HTN (hypertension) 08/12/2010   Home Medication(s) Prior to Admission medications   Medication Sig Start Date End Date Taking? Authorizing Provider  cephALEXin (KEFLEX) 500 MG capsule Take 1 capsule (500 mg total) by mouth 3 (three) times daily for 7 days. 08/24/22 08/31/22 Yes Sloan Leiter, DO  HYDROcodone-acetaminophen (NORCO) 5-325 MG tablet Take 1 tablet by mouth every 6 (six) hours as needed for moderate pain. 08/24/22  Yes Sloan Leiter, DO  acetaminophen (TYLENOL) 650 MG CR tablet Take 1,300 mg by mouth as needed for pain.    [provider]  aspirin EC 81 MG tablet Take 81 mg by mouth at bedtime. Swallow whole.    [provider]  atenolol (TENORMIN) 25 MG tablet Take 0.5 tablets (12.5 mg total) by mouth 2 (two) times daily. 06/01/22   Raliegh Ip, DO  Calcium Carb-Cholecalciferol (CALCIUM + VITAMIN D3 PO) Take 1 tablet by mouth 2 (two) times daily.    [provider]  ciprofloxacin (CIPRO) 500 MG tablet Take 1 tablet (500 mg total) by mouth 2 (two) times  daily. One po bid x 7 days 08/14/22   Arby Barrette, MD  dexamethasone (DECADRON) 4 MG tablet Take 2 tabs at the day before and 1 tab daily for 2 days after chemotherapy Patient taking differently: Take 4-8 mg by mouth See admin instructions. Take 8 mg the day before chemo and 4 mg daily for 2 days after chemotherapy 05/17/22   [name], MD  EPINEPHrine 0.3 mg/0.3 mL IJ SOAJ injection Inject 0.3 mg into the muscle as needed for anaphylaxis. 05/24/22   Daphine Deutscher  Mary-Margaret, FNP  HYDROcodone-acetaminophen (NORCO/VICODIN) 5-325 MG tablet Take 1 tablet by mouth every 6 (six) hours as needed. 08/14/22   Arby Barrette, MD  levothyroxine (SYNTHROID) 100 MCG tablet Take 1 tablet (100 mcg total) by mouth in the morning. 06/01/22   Raliegh Ip, DO  lidocaine-prilocaine (EMLA) cream Apply to affected area once Patient taking differently: Apply 1 Application topically as needed (port access). 04/20/22   [name], MD  loratadine (CLARITIN) 10 MG tablet Take 10 mg by mouth daily as needed for allergies.    [provider]  metroNIDAZOLE (FLAGYL) 500 MG tablet Take 1 tablet (500 mg total) by mouth 2 (two) times daily. One po bid x 7 days 08/14/22   Arby Barrette, MD  Multiple Vitamin (MULTIVITAMIN) tablet Take 1 tablet by mouth in the morning.    [provider]  olaparib (LYNPARZA) 100 MG tablet Take 1 tablet (100 mg total) by mouth 2 (two) times daily. Swallow whole. May take with food to decrease nausea and vomiting. 08/10/22   [name], MD  ondansetron (ZOFRAN) 8 MG tablet Take 1 tablet (8 mg total) by mouth every 8 (eight) hours as needed for nausea or vomiting. Start on the third day after chemotherapy. Patient taking differently: Take 8 mg by mouth as needed for nausea or vomiting. 04/20/22   [name], MD  promethazine (PHENERGAN) 12.5 MG tablet Take 12.5 mg by mouth every 6 (six) hours as needed for nausea or vomiting. Patient not sure of dose    [provider]                                                                                                                                    Past Surgical History Past Surgical History:  Procedure Laterality Date   ABDOMINAL HYSTERECTOMY     APPENDECTOMY     BREAST BIOPSY     BREAST LUMPECTOMY WITH RADIOACTIVE SEED LOCALIZATION Left 12/01/2021   Procedure: LEFT BREAST RADIOACTIVE SEED GUIDED LUMPECTOMY;  Surgeon: Abigail Miyamoto, MD;  Location: [facility];  Service: General;  Laterality: Left;  LMA   COLONOSCOPY  2011   [email or web address]   DEBULKING N/A 03/30/2022   Procedure: TUMOR DEBULKING;  Surgeon: Carver Fila, MD;  Location: [facility];  Service: Gynecology;  Laterality: N/A;   EYE SURGERY     77 years old   IR  IMAGING GUIDED PORT INSERTION  03/19/2022   LAPAROSCOPY N/A 03/30/2022   Procedure: LAPAROSCOPY DIAGNOSTIC;  Surgeon: Carver Fila, MD;  Location: [facility];  Service: Gynecology;  Laterality: N/A;   MASTECTOMY Right 1984   ROTATOR CUFF REPAIR     SALPINGOOPHORECTOMY Bilateral 03/30/2022   Procedure: OPEN SALPINGO OOPHORECTOMY;  Surgeon: Carver Fila, MD;  Location: [facility];  Service: Gynecology;  Laterality: Bilateral;   THYROID SURGERY Bilateral 1967   VARICOSE VEIN SURGERY     ablation   WISDOM TOOTH EXTRACTION     Family History Family History  Problem Relation Age of Onset   Heart attack Mother    Alcohol abuse Mother    Lung cancer Mother        d. mid 44s   Breast cancer Maternal Aunt 58   Colon polyps Neg Hx    Colon cancer Neg Hx    Esophageal cancer Neg Hx    Stomach cancer Neg Hx    Rectal cancer Neg Hx     Social History Social History   Tobacco Use   Smoking status: Former    Packs/day: .25    Types: Cigarettes    Quit date: 05/11/1967    Years since quitting: 55.3   Smokeless tobacco: Never  Vaping Use   Vaping Use: Never used  Substance Use Topics   Alcohol use: No   Drug use: No   Allergies Paclitaxel, Emend [fosaprepitant dimeglumine], Codeine, Nsaids, Penicillins, Vibra-tab [doxycycline], Iodine, and Latex  Review of Systems Review of Systems  Constitutional:  Negative for activity change and fever.  HENT:  Negative for facial swelling and trouble swallowing.   Eyes:  Negative for discharge and redness.  Respiratory:  Negative for cough and shortness of breath.   Cardiovascular:  Negative for chest pain and palpitations.  Gastrointestinal:  Positive for  abdominal pain, nausea and vomiting.  Genitourinary:  Positive for difficulty urinating and pelvic pain. Negative for dysuria and flank pain.  Musculoskeletal:  Negative for back pain and gait problem.  Skin:  Negative for pallor and rash.  Neurological:  Negative for syncope and headaches.    Physical Exam Vital Signs  I have reviewed the triage vital signs BP (!) 148/79   Pulse 73   Temp 97.9 F (36.6 C)   Resp 20   Ht 5\' 7"  (1.702 m)   Wt 65.8 kg   SpO2 100%   BMI 22.72 kg/m  Physical Exam Vitals and nursing note reviewed.  Constitutional:      General: She is not in acute distress.    Appearance: Normal appearance.  HENT:     Head: Normocephalic and atraumatic.     Right Ear: External ear normal.     Left Ear: External ear normal.     Nose: Nose normal.     Mouth/Throat:     Mouth: Mucous membranes are moist.  Eyes:     General: No scleral icterus.       Right eye: No discharge.        Left eye: No discharge.  Cardiovascular:     Rate and Rhythm: Normal rate and regular rhythm.     Pulses: Normal pulses.     Heart sounds: Normal heart sounds.  Pulmonary:     Effort: Pulmonary effort is normal. No respiratory distress.     Breath sounds: Normal breath sounds.  Chest:     Comments: Chest wall portacath noted  Abdominal:     General: Abdomen is  flat. There is distension.     Palpations: Abdomen is soft.     Tenderness: There is abdominal tenderness.    Musculoskeletal:     Right lower leg: No edema.     Left lower leg: No edema.  Skin:    General: Skin is warm and dry.     Capillary Refill: Capillary refill takes less than 2 seconds.  Neurological:     Mental Status: She is alert and oriented to person, place, and time.     GCS: GCS eye subscore is 4. GCS verbal subscore is 5. GCS motor subscore is 6.  Psychiatric:        Mood and Affect: Mood normal.        Behavior: Behavior normal.     ED Results and Treatments Labs (all labs ordered are listed,  but only abnormal results are displayed) Labs Reviewed  CBC WITH DIFFERENTIAL/PLATELET - Abnormal; Notable for the following components:      Result Value   RBC 2.24 (*)    Hemoglobin 7.3 (*)    HCT 21.5 (*)    RDW 16.6 (*)    All other components within normal limits  COMPREHENSIVE METABOLIC PANEL - Abnormal; Notable for the following components:   Sodium 129 (*)    CO2 21 (*)    Calcium 8.8 (*)    Total Protein 5.7 (*)    AST 13 (*)    Alkaline Phosphatase 37 (*)    All other components within normal limits  URINALYSIS, ROUTINE W REFLEX MICROSCOPIC - Abnormal; Notable for the following components:   APPearance HAZY (*)    Hgb urine dipstick MODERATE (*)    Protein, ur 30 (*)    Leukocytes,Ua LARGE (*)    Bacteria, UA FEW (*)    Non Squamous Epithelial 0-5 (*)    All other components within normal limits  URINE CULTURE  LIPASE, BLOOD                                                                                                                          Radiology CT ABDOMEN PELVIS WO CONTRAST  Result Date: 08/23/2022 CLINICAL DATA:  Abdominal pain, acute, non localized, colovaginal fistula on prior imaging with worsening abdominal pain. Urinary retention EXAM: CT ABDOMEN AND PELVIS WITHOUT CONTRAST TECHNIQUE: Multidetector CT imaging of the abdomen and pelvis was performed following the standard protocol without IV contrast. RADIATION DOSE REDUCTION: This exam was performed according to the departmental dose-optimization program which includes automated exposure control, adjustment of the mA and/or kV according to patient size and/or use of iterative reconstruction technique. COMPARISON:  CT abdomen and pelvis 08/14/2022 FINDINGS: Lower chest: No acute abnormality. Hepatobiliary: No suspicious focal hepatic lesion. Mild distention of the gallbladder. Unchanged dilation of the common bile duct measuring up to 14 mm. No radiopaque stones or evidence of cholecystitis. Pancreas:  Unremarkable. Spleen: Unremarkable. Adrenals/Urinary Tract: Normal adrenal glands. Severe atrophy of the left kidney. Normal appearance of the  right kidney. No urinary calculi. Slight decrease in pelviectasis and dilation of the left ureter. Increased large volume gas within the bladder. The inflamed sigmoid colon abuts the superior left aspect of the bladder. Given the increased large amount of gas a colovesical fistula is difficult to exclude. Stomach/Bowel: Diverticulosis and wall thickening of the sigmoid colon with adjacent inflammatory change is similar to slightly decreased from 08/14/2022. Small amount of pneumatosis in the sigmoid colon near the area of the colovaginal fistula. The previous collection of gas involving the wall of the sigmoid colon has decreased. There is a small amount of residual gas in the area of the left vaginal cuff (series 2/image 67). There is again a soft tissue tract extending from the vaginal cuff to the sigmoid colon (circa series 5/image 39) though no gas is seen within this tract. Normal caliber large and small bowel. Moderate colonic stool load mixed with contrast. Decreased size and wall thickening about the large diverticulum in the proximal transverse colon (series 5/image 52). Redemonstrated large duodenal diverticulum. Vascular/Lymphatic: Aortic atherosclerosis. No enlarged abdominal or pelvic lymph nodes. Reproductive: Hysterectomy.  No adnexal mass. Other: Small volume free fluid in the pelvis. No free intraperitoneal air. Musculoskeletal: No acute osseous abnormality. IMPRESSION: 1. Increased large volume gas within the bladder. This raises concern for colovesical fistula. Consider CT cystogram for further evaluation. 2. Similar to slightly decreased inflammatory changes surrounding the sigmoid colon. The previous collection of gas involving the wall of the sigmoid colon has decreased. There is a small amount of residual gas in the area of the left vaginal cuff. There  is again a soft tissue tract extending from the vaginal cuff to the sigmoid colon, though no gas is seen within this tract. 3. Decreased size and wall thickening about the large diverticulum in the proximal transverse colon. 4. Slight decrease in pelviectasis and dilation of the left ureter. Aortic Atherosclerosis (ICD10-I70.0). Electronically Signed   By: Minerva Fester M.D.   On: 08/23/2022 21:39    Pertinent labs & imaging results that were available during my care of the patient were reviewed by me and considered in my medical decision making (see MDM for details).  Medications Ordered in ED Medications  HYDROcodone-acetaminophen (NORCO/VICODIN) 5-325 MG per tablet 1 tablet (1 tablet Oral Given 08/23/22 2143)  ondansetron (ZOFRAN-ODT) disintegrating tablet 4 mg (4 mg Oral Given 08/23/22 2144)  sodium chloride 0.9 % bolus 1,000 mL (0 mLs Intravenous Stopped 08/23/22 2318)                                                                                                                                     Procedures Procedures  (including critical care time)  Medical Decision Making / ED Course    Medical Decision Making:    April Weber is a 77 y.o. female with past medical history as below, significant for CKD, HLD, HTN, colovaginal/colovesicular fistula, who presents to  the ED with complaint of abd pain. . The complaint involves an extensive differential diagnosis and also carries with it a high risk of complications and morbidity.  Serious etiology was considered. Ddx includes but is not limited to: Differential diagnosis includes but is not exclusive to ectopic pregnancy, ovarian cyst, ovarian torsion, acute appendicitis, urinary tract infection, endometriosis, bowel obstruction, hernia, colitis, renal colic, gastroenteritis, volvulus etc.   Complete initial physical exam performed, notably the patient  was no acute distress, vital signs stable, abdomen is soft but tender suprapubic,  mildly distended.  Not peritoneal.    Reviewed and confirmed nursing documentation for past medical history, family history, social history.  Vital signs reviewed.    Clinical Course as of 08/24/22 0013  Tue Aug 24, 2022  0008 Hemoglobin(!): 7.3 Baseline between 7-10 [SG]  0008 Bacteria, UA(!): FEW [SG]  0008 WBC, UA: >50 Likely ongoing infection secondary to fistula, give Keflex, send urine culture [SG]  0009 She is feeling much better after intervention.  Pain well-controlled, feels comfortable plan for discharge and close outpatient follow-up with surgeon and oncology. [SG]    Clinical Course User Index [SG] Sloan Leiter, DO   Patient still requires evaluation by surgery in the outpatient setting.  Likely ongoing colovaginal versus colovesicular fistula.  Likely chronic UTI secondary to fistula.  Will repeat antibiotics.  She will follow-up with her oncologist and surgeon tomorrow for further evaluation and guidance.  She has appointment already with colorectal surgery to evaluate for this problem.  Recommend she call to see if she can be seen sooner in the office.  Discussed pain control for home.  She is voiding spontaneously.  She is feeling much better, tolerating p.o., comfortable plan for discharge.  The patient improved significantly and was discharged in stable condition. Detailed discussions were had with the patient regarding current findings, and need for close f/u with PCP or on call doctor. The patient has been instructed to return immediately if the symptoms worsen in any way for re-evaluation. Patient verbalized understanding and is in agreement with current care plan. All questions answered prior to discharge.        Additional history obtained: -Additional history obtained from friend -External records from outside source obtained and reviewed including: Chart review including previous notes, labs, imaging, consultation notes including prior ED visits, prior labs and  imaging, home medications, prior consult notes, primary care documentation   Lab Tests: -I ordered, reviewed, and interpreted labs.   The pertinent results include:   Labs Reviewed  CBC WITH DIFFERENTIAL/PLATELET - Abnormal; Notable for the following components:      Result Value   RBC 2.24 (*)    Hemoglobin 7.3 (*)    HCT 21.5 (*)    RDW 16.6 (*)    All other components within normal limits  COMPREHENSIVE METABOLIC PANEL - Abnormal; Notable for the following components:   Sodium 129 (*)    CO2 21 (*)    Calcium 8.8 (*)    Total Protein 5.7 (*)    AST 13 (*)    Alkaline Phosphatase 37 (*)    All other components within normal limits  URINALYSIS, ROUTINE W REFLEX MICROSCOPIC - Abnormal; Notable for the following components:   APPearance HAZY (*)    Hgb urine dipstick MODERATE (*)    Protein, ur 30 (*)    Leukocytes,Ua LARGE (*)    Bacteria, UA FEW (*)    Non Squamous Epithelial 0-5 (*)    All other components  within normal limits  URINE CULTURE  LIPASE, BLOOD    Notable for as above  EKG   EKG Interpretation  Date/Time:    Ventricular Rate:    PR Interval:    QRS Duration:   QT Interval:    QTC Calculation:   R Axis:     Text Interpretation:           Imaging Studies ordered: I ordered imaging studies including CT abdomen pelvis without contrast I independently visualized the following imaging with scope of interpretation limited to determining acute life threatening conditions related to emergency care; findings noted above, significant for similar to prior, likely fistula I independently visualized and interpreted imaging. I agree with the radiologist interpretation   Medicines ordered and prescription drug management: Meds ordered this encounter  Medications   HYDROcodone-acetaminophen (NORCO/VICODIN) 5-325 MG per tablet 1 tablet   ondansetron (ZOFRAN-ODT) disintegrating tablet 4 mg   sodium chloride 0.9 % bolus 1,000 mL   HYDROcodone-acetaminophen  (NORCO) 5-325 MG tablet    Sig: Take 1 tablet by mouth every 6 (six) hours as needed for moderate pain.    Dispense:  15 tablet    Refill:  0   cephALEXin (KEFLEX) 500 MG capsule    Sig: Take 1 capsule (500 mg total) by mouth 3 (three) times daily for 7 days.    Dispense:  21 capsule    Refill:  0    -I have reviewed the patients home medicines and have made adjustments as needed   Consultations Obtained: na   Cardiac Monitoring: The patient was maintained on a cardiac monitor.  I personally viewed and interpreted the cardiac monitored which showed an underlying rhythm of: NSR  Social Determinants of Health:  Diagnosis or treatment significantly limited by social determinants of health: former smoker   Reevaluation: After the interventions noted above, I reevaluated the patient and found that they have improved  Co morbidities that complicate the patient evaluation  Past Medical History:  Diagnosis Date   Arthritis    Breast cancer 1980   RIGHT   Chronic kidney disease    has one kidney   DVT (deep venous thrombosis)    on RIGHT leg-hx of   Dyspnea    Hyperlipidemia    on meds   Hypertension    on meds   Osteopenia    Peripheral vascular disease    PONV (postoperative nausea and vomiting)    Seasonal allergies    Thyroid disease    on meds      Dispostion: Disposition decision including need for hospitalization was considered, and patient discharged from emergency department.    Final Clinical Impression(s) / ED Diagnoses Final diagnoses:  Abdominal pain, unspecified abdominal location  Urinary tract infection with hematuria, site unspecified  Colovesical fistula     This chart was dictated using voice recognition software.  Despite best efforts to proofread,  errors can occur which can change the documentation meaning.    Sloan Leiter, DO 08/24/22 0010    Sloan Leiter, DO 08/24/22 [phone]

## 2022-08-23 NOTE — Telephone Encounter (Signed)
Called and given below message. She verbalized understanding and appreciated the call. She will call the office  back for questions.

## 2022-08-23 NOTE — Telephone Encounter (Signed)
She can try imodium but stop as soon as possible to avoid severe constipation

## 2022-08-23 NOTE — Discharge Instructions (Addendum)
Please follow-up with your oncologist tomorrow, follow-up with your colorectal surgeon urgently as an outpatient.   It was a pleasure caring for you today in the emergency department.  Please return to the emergency department for any worsening or worrisome symptoms.

## 2022-08-23 NOTE — ED Triage Notes (Signed)
Arrives with complaints of urinary retention x1 day.

## 2022-08-24 ENCOUNTER — Telehealth: Payer: Self-pay

## 2022-08-24 ENCOUNTER — Other Ambulatory Visit: Payer: Self-pay | Admitting: Hematology and Oncology

## 2022-08-24 ENCOUNTER — Encounter: Payer: Self-pay | Admitting: Oncology

## 2022-08-24 MED ORDER — HYDROCODONE-ACETAMINOPHEN 5-325 MG PO TABS: 1.0000 | ORAL_TABLET | Freq: Four times a day (QID) | ORAL | 0 refills | Status: DC | PRN

## 2022-08-24 MED ORDER — HEPARIN SOD (PORK) LOCK FLUSH 100 UNIT/ML IV SOLN
500.0000 [IU] | Freq: Once | INTRAVENOUS | Status: AC
  Administered 2022-08-24: 500 [IU]
  Filled 2022-08-24: qty 5

## 2022-08-24 MED ORDER — CEPHALEXIN 500 MG PO CAPS: 500.0000 mg | ORAL_CAPSULE | Freq: Three times a day (TID) | ORAL | 0 refills | Status: DC

## 2022-08-24 NOTE — Telephone Encounter (Signed)
I cannot help her to get a surgical opinion sooner Offer her IVF this week; if she agrees, let me know and I will put in order I can see her on Thursday this week for eval

## 2022-08-24 NOTE — Telephone Encounter (Signed)
Called to see what time works for her on Thursday for appts. She is going to call the office back after getting transportation.

## 2022-08-24 NOTE — Telephone Encounter (Addendum)
She arranged transportation and is scheduled on 4/18 with Dr. Bertis Ruddy at 11 am and will get infusion at 1145. She is aware of appt times.  FYI

## 2022-08-24 NOTE — Progress Notes (Signed)
Faxed referral to [facility] per Dr. Bertis Ruddy.

## 2022-08-24 NOTE — Telephone Encounter (Signed)
Called to see how she is doing after going to the ER last night. She is complaining of a lot of pain/tenderness in rectal/ perineum and she is taking pain medication at prescribed. She is having a lot of urgency, leaking/ incontinence and hard time sleeping due to going to the bathroom so much. She is getting frustrated and upset with waiting on surgeon appt that is scheduled on 4/30. She did get IV fluids in the ER and eventually urinated. Told her a referral was sent to [facility] for consult. She is able to eat and drink with no problems. She is not sure if she is drinking enough. She is drinking fluids all the time.  She is asking what she should do? She wants to see the surgeon sooner and have surgery if it is a option.

## 2022-08-24 NOTE — ED Notes (Signed)
DC papers reviewed. No questions or concerns. No signs of distress. Pt assisted to wheelchair and out to lobby. Appropriate measures for safety taken. 

## 2022-08-24 NOTE — Telephone Encounter (Signed)
Hi Brenda, since Thursday is only 2 days away, please call charge nurse and ask what is available I can see her in infusion room if needed Order for IVF is in, 1 liter NS over 2 hours

## 2022-08-24 NOTE — Telephone Encounter (Addendum)
Called Pat and advised her of message below from Dr. Bertis Ruddy. Asked if she would like IVF this week and to see Dr. Bertis Ruddy on Thursday.  She would like both appointments scheduled on Thursday if possible. Advised her that we will call her with the appointment times.  Also advised her that I called [facility] to try to move up her appointment with Dr. Maisie Fus and they do not have any earlier appointments.

## 2022-08-25 LAB — URINE CULTURE: Culture: <10000 — AB

## 2022-08-26 ENCOUNTER — Inpatient Hospital Stay: Payer: Medicare Other

## 2022-08-26 ENCOUNTER — Inpatient Hospital Stay (HOSPITAL_BASED_OUTPATIENT_CLINIC_OR_DEPARTMENT_OTHER): Payer: Medicare Other | Admitting: Hematology and Oncology

## 2022-08-26 ENCOUNTER — Other Ambulatory Visit: Payer: Self-pay

## 2022-08-26 ENCOUNTER — Encounter: Payer: Self-pay | Admitting: Hematology and Oncology

## 2022-08-26 ENCOUNTER — Telehealth: Payer: Self-pay | Admitting: Oncology

## 2022-08-26 VITALS — BP 136/64 | HR 90 | Temp 99.9°F | Wt 155.6 lb

## 2022-08-26 VITALS — BP 118/74 | HR 80 | Temp 99.9°F | Resp 18

## 2022-08-26 DIAGNOSIS — R300 Dysuria: Secondary | ICD-10-CM | POA: Diagnosis not present

## 2022-08-26 DIAGNOSIS — E861 Hypovolemia: Secondary | ICD-10-CM | POA: Diagnosis present

## 2022-08-26 DIAGNOSIS — E039 Hypothyroidism, unspecified: Secondary | ICD-10-CM | POA: Diagnosis present

## 2022-08-26 DIAGNOSIS — D631 Anemia in chronic kidney disease: Secondary | ICD-10-CM | POA: Diagnosis present

## 2022-08-26 DIAGNOSIS — D62 Acute posthemorrhagic anemia: Secondary | ICD-10-CM | POA: Diagnosis present

## 2022-08-26 DIAGNOSIS — T451X5A Adverse effect of antineoplastic and immunosuppressive drugs, initial encounter: Secondary | ICD-10-CM | POA: Diagnosis not present

## 2022-08-26 DIAGNOSIS — K5900 Constipation, unspecified: Secondary | ICD-10-CM | POA: Diagnosis present

## 2022-08-26 DIAGNOSIS — Z87891 Personal history of nicotine dependence: Secondary | ICD-10-CM | POA: Diagnosis not present

## 2022-08-26 DIAGNOSIS — N261 Atrophy of kidney (terminal): Secondary | ICD-10-CM | POA: Diagnosis not present

## 2022-08-26 DIAGNOSIS — R1032 Left lower quadrant pain: Secondary | ICD-10-CM | POA: Diagnosis not present

## 2022-08-26 DIAGNOSIS — Z86718 Personal history of other venous thrombosis and embolism: Secondary | ICD-10-CM | POA: Diagnosis not present

## 2022-08-26 DIAGNOSIS — D6481 Anemia due to antineoplastic chemotherapy: Secondary | ICD-10-CM

## 2022-08-26 DIAGNOSIS — K56609 Unspecified intestinal obstruction, unspecified as to partial versus complete obstruction: Secondary | ICD-10-CM | POA: Diagnosis not present

## 2022-08-26 DIAGNOSIS — N1370 Vesicoureteral-reflux, unspecified: Secondary | ICD-10-CM | POA: Diagnosis not present

## 2022-08-26 DIAGNOSIS — Z880 Allergy status to penicillin: Secondary | ICD-10-CM | POA: Diagnosis not present

## 2022-08-26 DIAGNOSIS — R350 Frequency of micturition: Secondary | ICD-10-CM | POA: Diagnosis not present

## 2022-08-26 DIAGNOSIS — N1831 Chronic kidney disease, stage 3a: Secondary | ICD-10-CM | POA: Diagnosis present

## 2022-08-26 DIAGNOSIS — I129 Hypertensive chronic kidney disease with stage 1 through stage 4 chronic kidney disease, or unspecified chronic kidney disease: Secondary | ICD-10-CM | POA: Diagnosis present

## 2022-08-26 DIAGNOSIS — Z95828 Presence of other vascular implants and grafts: Secondary | ICD-10-CM

## 2022-08-26 DIAGNOSIS — I10 Essential (primary) hypertension: Secondary | ICD-10-CM | POA: Diagnosis not present

## 2022-08-26 DIAGNOSIS — Z881 Allergy status to other antibiotic agents status: Secondary | ICD-10-CM | POA: Diagnosis not present

## 2022-08-26 DIAGNOSIS — I739 Peripheral vascular disease, unspecified: Secondary | ICD-10-CM | POA: Diagnosis not present

## 2022-08-26 DIAGNOSIS — C561 Malignant neoplasm of right ovary: Secondary | ICD-10-CM

## 2022-08-26 DIAGNOSIS — K5732 Diverticulitis of large intestine without perforation or abscess without bleeding: Secondary | ICD-10-CM | POA: Diagnosis not present

## 2022-08-26 DIAGNOSIS — E785 Hyperlipidemia, unspecified: Secondary | ICD-10-CM | POA: Diagnosis present

## 2022-08-26 DIAGNOSIS — K5730 Diverticulosis of large intestine without perforation or abscess without bleeding: Secondary | ICD-10-CM | POA: Diagnosis not present

## 2022-08-26 DIAGNOSIS — N1330 Unspecified hydronephrosis: Secondary | ICD-10-CM | POA: Diagnosis not present

## 2022-08-26 DIAGNOSIS — E860 Dehydration: Secondary | ICD-10-CM | POA: Diagnosis present

## 2022-08-26 DIAGNOSIS — M8580 Other specified disorders of bone density and structure, unspecified site: Secondary | ICD-10-CM | POA: Diagnosis present

## 2022-08-26 DIAGNOSIS — N3289 Other specified disorders of bladder: Secondary | ICD-10-CM | POA: Diagnosis not present

## 2022-08-26 DIAGNOSIS — N321 Vesicointestinal fistula: Secondary | ICD-10-CM | POA: Diagnosis not present

## 2022-08-26 DIAGNOSIS — N823 Fistula of vagina to large intestine: Secondary | ICD-10-CM | POA: Diagnosis not present

## 2022-08-26 DIAGNOSIS — D849 Immunodeficiency, unspecified: Secondary | ICD-10-CM | POA: Diagnosis present

## 2022-08-26 DIAGNOSIS — K5720 Diverticulitis of large intestine with perforation and abscess without bleeding: Secondary | ICD-10-CM | POA: Diagnosis not present

## 2022-08-26 DIAGNOSIS — L988 Other specified disorders of the skin and subcutaneous tissue: Secondary | ICD-10-CM | POA: Diagnosis not present

## 2022-08-26 DIAGNOSIS — Z8249 Family history of ischemic heart disease and other diseases of the circulatory system: Secondary | ICD-10-CM | POA: Diagnosis not present

## 2022-08-26 DIAGNOSIS — Z79890 Hormone replacement therapy (postmenopausal): Secondary | ICD-10-CM | POA: Diagnosis not present

## 2022-08-26 DIAGNOSIS — A419 Sepsis, unspecified organism: Secondary | ICD-10-CM | POA: Diagnosis present

## 2022-08-26 DIAGNOSIS — E871 Hypo-osmolality and hyponatremia: Secondary | ICD-10-CM | POA: Diagnosis present

## 2022-08-26 DIAGNOSIS — N322 Vesical fistula, not elsewhere classified: Secondary | ICD-10-CM | POA: Diagnosis present

## 2022-08-26 DIAGNOSIS — R3989 Other symptoms and signs involving the genitourinary system: Secondary | ICD-10-CM | POA: Diagnosis not present

## 2022-08-26 LAB — CBC WITH DIFFERENTIAL (CANCER CENTER ONLY)
Abs Immature Granulocytes: 0.02 10*3/uL (ref 0.00–0.07)
Basophils Absolute: 0 10*3/uL (ref 0.0–0.1)
Basophils Relative: 0 %
Eosinophils Absolute: 0 10*3/uL (ref 0.0–0.5)
Eosinophils Relative: 0 %
HCT: 23.9 % — ABNORMAL LOW (ref 36.0–46.0)
Hemoglobin: 7.9 g/dL — ABNORMAL LOW (ref 12.0–15.0)
Immature Granulocytes: 0 %
Lymphocytes Relative: 10 %
Lymphs Abs: 0.6 10*3/uL — ABNORMAL LOW (ref 0.7–4.0)
MCH: 32.4 pg (ref 26.0–34.0)
MCHC: 33.1 g/dL (ref 30.0–36.0)
MCV: 98 fL (ref 80.0–100.0)
Monocytes Absolute: 0.6 10*3/uL (ref 0.1–1.0)
Monocytes Relative: 10 %
Neutro Abs: 4.9 10*3/uL (ref 1.7–7.7)
Neutrophils Relative %: 80 %
Platelet Count: 180 10*3/uL (ref 150–400)
RBC: 2.44 MIL/uL — ABNORMAL LOW (ref 3.87–5.11)
RDW: 16.6 % — ABNORMAL HIGH (ref 11.5–15.5)
WBC Count: 6.1 10*3/uL (ref 4.0–10.5)
nRBC: 0 % (ref 0.0–0.2)

## 2022-08-26 LAB — SAMPLE TO BLOOD BANK

## 2022-08-26 MED ORDER — SULFAMETHOXAZOLE-TRIMETHOPRIM 400-80 MG PO TABS: 1.0000 | ORAL_TABLET | Freq: Two times a day (BID) | ORAL | 0 refills | Status: DC

## 2022-08-26 MED ORDER — HEPARIN SOD (PORK) LOCK FLUSH 100 UNIT/ML IV SOLN
500.0000 [IU] | Freq: Once | INTRAVENOUS | Status: AC | PRN
  Administered 2022-08-26: 500 [IU]

## 2022-08-26 MED ORDER — SODIUM CHLORIDE 0.9% FLUSH
10.0000 mL | Freq: Once | INTRAVENOUS | Status: AC | PRN
  Administered 2022-08-26: 10 mL

## 2022-08-26 MED ORDER — SODIUM CHLORIDE 0.9 % IV SOLN: Freq: Once | INTRAVENOUS | Status: AC

## 2022-08-26 NOTE — Assessment & Plan Note (Signed)
She is prone to get dehydrated I recommend IV fluid support

## 2022-08-26 NOTE — Assessment & Plan Note (Signed)
She had recent anemia but slightly improving We discussed the risk and benefits of blood transfusion support The patient declined blood transfusion

## 2022-08-26 NOTE — Assessment & Plan Note (Signed)
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

## 2022-08-26 NOTE — Progress Notes (Signed)
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 I have a long discussion with the patient This surgical disease that I cannot treat She has been referred to see urologist and general surgery and despite multiple phone calls, I am not able to get her seen sooner If the patient have worsening pain, bleeding as well as fever and chills, I recommend the patient to be admitted through the emergency department for evaluation For now, we will defer starting her on olaparib until further notice  Fistula She had fistula drainage that is debilitating As above, she has surgical disease that I cannot treat I recommend urology consult and general surgery consult She had recent UTI Due to allergy to penicillin, the patient did not take recent prescribed antibiotics I will change her to Bactrim  Anemia due to antineoplastic chemotherapy She had recent anemia but slightly improving We discussed the risk and benefits of blood transfusion support The patient declined blood transfusion  Stage 3a chronic kidney disease (HCC) She is prone to get dehydrated I recommend IV fluid support  Orders Placed This Encounter  Procedures   CBC with Differential (Cancer Center Only)    Standing Status:   Future    Number of Occurrences:   1    Standing Expiration Date:   08/26/2023   Sample to Blood Bank    Standing Status:   Standing    Number of Occurrences:   33    Standing Expiration Date:    08/26/2023    All questions were answered. The patient knows to call the clinic with any problems, questions or concerns. The total time spent in the appointment was 40 minutes encounter with patients including review of chart and various tests results, discussions about plan of care and coordination of care plan   [name], MD 08/26/2022 1:36 PM  INTERVAL HISTORY: Please see below for problem oriented charting. she returns for urgent evaluation The patient has history of diverticulitis Soon after, she complained of significant vaginal discharge and was found to have fistula between the colon and her vagina She was recommended general surgery evaluation Her appointment is set for April 30 In the meantime, she continues to have intermittent abdominal pain She went to the emergency department again recently and was now found to have fistula between her colon and her bladder She was placed on a course of antibiotics but did not take it because she is allergic to penicillin and does not want to take cephalosporins She continues to have discomfort No fever or chills Her discharge is debilitating because she has to change her diaper on a regular basis She has requested appointment to be moved sooner; unfortunately, despite multiple phone calls, were not able to get her appointment moved sooner She is drinking lots of fluids but she felt fatigue and may be dehydrated  REVIEW OF SYSTEMS:   Constitutional: Denies fevers, chills or abnormal weight loss Eyes: Denies blurriness of vision Ears, nose, mouth, throat, and face: Denies mucositis or sore throat Respiratory: Denies cough, dyspnea or wheezes  Cardiovascular: Denies palpitation, chest discomfort or lower extremity swelling Skin: Denies abnormal skin rashes Lymphatics: Denies new lymphadenopathy or easy bruising Neurological:Denies numbness, tingling or new weaknesses Behavioral/Psych: Mood is stable, no new changes  All other systems  were reviewed with the patient and are negative.  I have reviewed the past medical history, past surgical history, social history and family history with the patient and they are unchanged from previous note.  ALLERGIES:  is allergic to paclitaxel, emend [fosaprepitant dimeglumine], codeine, nsaids, penicillins, vibra-tab [doxycycline], iodine, and latex.  MEDICATIONS:  Current Outpatient Medications  Medication Sig Dispense Refill   sulfamethoxazole-trimethoprim (BACTRIM) 400-80 MG tablet Take 1 tablet by mouth 2 (two) times daily. 14 tablet 0   acetaminophen (TYLENOL) 650 MG CR tablet Take 1,300 mg by mouth as needed for pain.     aspirin EC 81 MG tablet Take 81 mg by mouth at bedtime. Swallow whole.     atenolol (TENORMIN) 25 MG tablet Take 0.5 tablets (12.5 mg total) by mouth 2 (two) times daily. 90 tablet 3   Calcium Carb-Cholecalciferol (CALCIUM + VITAMIN D3 PO) Take 1 tablet by mouth 2 (two) times daily.     EPINEPHrine 0.3 mg/0.3 mL IJ SOAJ injection Inject 0.3 mg into the muscle as needed for anaphylaxis. 2 each 2   HYDROcodone-acetaminophen (NORCO) 5-325 MG tablet Take 1 tablet by mouth every 6 (six) hours as needed for moderate pain. 15 tablet 0   HYDROcodone-acetaminophen (NORCO/VICODIN) 5-325 MG tablet Take 1 tablet by mouth every 6 (six) hours as needed. 20 tablet 0   levothyroxine (SYNTHROID) 100 MCG tablet Take 1 tablet (100 mcg total) by mouth in the morning. 90 tablet 3   loratadine (CLARITIN) 10 MG tablet Take 10 mg by mouth daily as needed for allergies.     Multiple Vitamin (MULTIVITAMIN) tablet Take 1 tablet by mouth in the morning.     olaparib (LYNPARZA) 100 MG tablet Take 1 tablet (100 mg total) by mouth 2 (two) times daily. Swallow whole. May take with food to decrease nausea and vomiting. 60 tablet 11   promethazine (PHENERGAN) 12.5 MG tablet Take 12.5 mg by mouth every 6 (six) hours as needed for nausea or vomiting. Patient not sure of dose     No current  facility-administered medications for this visit.   Facility-Administered Medications Ordered in Other Visits  Medication Dose Route Frequency Provider Last Rate Last Admin   heparin lock flush 100 unit/mL  500 Units Intracatheter Once PRN Bertis Ruddy, Lujain Kraszewski, MD       sodium chloride flush (NS) 0.9 % injection 10 mL  10 mL Intracatheter Once PRN [name], MD        SUMMARY OF ONCOLOGIC HISTORY: Oncology History Overview Note  High grade papillary serous 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   Right ovarian epithelial cancer  03/16/2022 Imaging   1. Large, mixed solid and cystic mass in the low pelvis, difficult to clearly delineate from adjacent ascites although at least 10.2 x 10.0 cm. This is highly concerning for primary ovarian malignancy and most likely arises from the right ovary, although may involve both ovaries. This may be further evaluated by contrast enhanced MRI of the pelvis if desired. 2. Large volume ascites throughout the abdomen and pelvis, presumed malignant. Stranding and nodularity of the omentum and peritoneum, particularly in the ventral abdomen and left upper quadrant, highly suspicious for peritoneal metastatic disease. 3. Cirrhotic morphology of the liver, which may contribute to ascites. 4. Severely atrophic left  kidney, consistent with remote prior infectious, obstructive, or ischemic insult. 5. Coronary artery disease.   Aortic Atherosclerosis (ICD10-I70.0).   03/17/2022 Imaging   1. There is a large solid and cystic mass within the right adnexa which measures up to 9.5 cm in maximum dimension. Findings are concerning for a primary ovarian neoplasm. 2. Large volume of ascites within the abdomen and pelvis. Given the presence of peritoneal nodules on the CT from the prior day findings are concerning for malignant ascites.   03/19/2022 Procedure   CT-guided biopsy of omental mass.    03/19/2022 Procedure   Successful ultrasound-guided paracentesis  yielding 4.2 liters of peritoneal fluid.   03/19/2022 Procedure   Successful placement of a left internal jugular approach power injectable Port-A-Cath. The catheter is ready for immediate use.   03/30/2022 Initial Diagnosis   Ovarian cancer (HCC)   03/30/2022 Pathology Results   A. RIGHT OVARY AND FALLOPIAN TUBE, SALPINGO OOPHORECTOMY: Poorly differentiated carcinoma immunohistochemically most consistent with a high grade serous carcinoma Tumor measures 10.7 x 10.6 x 4.6 cm Tumor involves ovarian and mesosalpinx serosal surfaces and right fallopian tube pT2a pN n/a  Note: The tumor consists of sheets and cords of high-grade nuclei with areas of necrosis and adjacent prominent stroma suggesting papillary cores the overall morphology is slightly unusual and not classic; therefore, a battery of immunohistochemical stains were performed to confirm the diagnosis and exclude a high-grade germ cell tumor.  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 Additionally there is nonspecific focal PLAP positivity; CD117 positivity and D2-40 positivity of uncertain clinical significance.  The tumor is negative for AFP, hCG, CD30, CD31, CK20, glypican-3, and OCT 3/4.  Dr. Luisa Hart has peer reviewed the case and agrees with diagnosis. Dr. Venetia Night has also initiated the workup of the case and agrees with the interpretation.  B. OMENTUM, RESECTION: Benign omentum Negative for tumor  C. RIGHT PELVIC SIDEWALL, BIOPSY: Benign mesothelium, fibrovascular stroma and adipose Negative for tumor  D. ANTERIOR CUL DE SAC, BIOPSY: Benign mesothelium Negative for tumor  E. LEFT PELVIC SIDEWALL, BIOPSY: Benign mesothelium and adipose Negative for tumor   ONCOLOGY TABLE:  OVARY or FALLOPIAN TUBE or PRIMARY PERITONEUM: Resection  Procedure: Right salpingo-oophorectomy Specimen Integrity: Grossly focally  disrupted Tumor Site: Right ovary Tumor Size: 10.7 x 10.6 x 4.6 cm Histologic Type: Poorly differentiated carcinoma most consistent with a high-grade serous carcinoma Histologic Grade: High-grade Ovarian Surface Involvement: Surface involvement Fallopian Tube Surface Involvement: Mesosalpinx serosal surface involvement and right fallopian tube involvement Implants (required for advanced stage serous/seromucinous borderline tumors only): Not identified Other Tissue/ Organ Involvement: N/A Largest Extrapelvic Peritoneal Focus: N/A Peritoneal/Ascitic Fluid Involvement: N/A Chemotherapy Response Score (CRS): N/A, no known presurgical therapy Regional Lymph Nodes: N/A, no lymph nodes submitted Distant Metastasis:      Distant Site(s) Involved: N/A Pathologic Stage Classification (pTNM, AJCC 8th Edition): pT2a, pN N/A Ancillary Studies: Can be performed on physician request Representative Tumor Block: A8 Comment(s): [None]    03/30/2022 Surgery   Preop Diagnosis: Pelvic mass, malignant ascites   Postoperative Diagnosis: at least stage IIB ovarian cancer, suspected high grade serous by frozen section   Surgery: Diagnostic laparoscopy, right salpingo-oophorectomy, peritoneal biopsies, infra-colic omentectomy    Surgeon:  Carin Hock MD    Operative findings: On exam under anesthesia, somewhat mobile mass appreciated in the cul-de-sac to the right.  On agnostic laparoscopy, normal upper abdominal findings other  than moderate volume ascites.  Normal-appearing peritoneum, omentum, small bowel.  Right adnexa enlarged with a mass measuring approximately 8-10 cm.  No carcinomatosis appreciated.  Given this, decision made to convert to an open procedure for tumor debulking.  Approximately 5 L of yellow-tinged ascites was removed upon intra-abdominal entry.  Normal upper abdominal survey including smooth diaphragm and liver, stomach, and omentum.  The small bowel was run from the cecum to the ligament  of Treitz with no abnormalities or carcinomatosis noted.  Right ovary replaced by an approximately 8 cm smooth mass, dilated and enlarged fallopian tube.  The mass itself was adherent to the right pelvic sidewall, sigmoid colon, and densely adherent to the anterior cul-de-sac/bladder peritoneum.  Frozen section consistent with invasive carcinoma, likely high-grade.  Left tube and ovary were surgically absent.  Sigmoid colon with adhesions to the left pelvic sidewall, left aspect of the bladder and the left vaginal cuff.  Somewhat redundant sigmoid which looped over on itself secondary to these adhesions.  Numerous diverticula noted of the sigmoid colon as well as the transverse colon.  The end of surgery, an R0 resection was accomplished.     04/13/2022 Cancer Staging   Staging form: Ovary, Fallopian Tube, and Primary Peritoneal Carcinoma, AJCC 8th Edition - Pathologic stage from 04/13/2022: Stage II (pT2, pN0, cM0) - Signed by [name], MD on 04/13/2022 Stage prefix: Initial diagnosis   04/26/2022 - 08/10/2022 Chemotherapy   Patient is on Treatment Plan : OVARIAN Carboplatin (AUC 6) + Paclitaxel (175) q21d X 6 Cycles     05/17/2022 Tumor Marker   Patient's tumor was tested for the following markers: CA-125. Results of the tumor marker test revealed 12.7.   06/06/2022 Genetic Testing   Germline: Negative [facility] Multi-Cancer +RNA Panel.  Report date is 06/06/2022.   The Multi-Cancer + RNA Panel offered by [facility] includes sequencing and/or deletion/duplication analysis of the following 70 genes:  AIP*, ALK, APC*, ATM*, AXIN2*, BAP1*, BARD1*, BLM*, BMPR1A*, BRCA1*, BRCA2*, BRIP1*, CDC73*, CDH1*, CDK4, CDKN1B*, CDKN2A, CHEK2*, CTNNA1*, DICER1*, EPCAM (del/dup only), EGFR, FH*, FLCN*, GREM1 (promoter dup only), HOXB13, KIT, LZTR1, MAX*, MBD4, MEN1*, MET, MITF, MLH1*, MSH2*, MSH3*, MSH6*, MUTYH*, NF1*, NF2*, NTHL1*, PALB2*, PDGFRA, PMS2*, POLD1*, POLE*, POT1*, PRKAR1A*, PTCH1*, PTEN*, RAD51C*, RAD51D*, RB1*,  RET, SDHA* (sequencing only), SDHAF2*, SDHB*, SDHC*, SDHD*, SMAD4*, SMARCA4*, SMARCB1*, SMARCE1*, STK11*, SUFU*, TMEM127*, TP53*, TSC1*, TSC2*, VHL*. RNA analysis is performed for * genes.  Somatic: Positive genomic instability score (GIS) of 62 through [facility].  No pathogenic variants detected in BRCA1/2.  Report date is June 10, 2022.   [facility] CDx includes sequencing and large rearrangement analysis of BRCA1/2 and genomic instability status through loss of heterozygosity (LOH), telomeric allelic imbalance (TAI) and large-scale state transitions (LST).     06/08/2022 Tumor Marker   Patient's tumor was tested for the following markers: Ca-125. Results of the tumor marker test revealed 16.5.   06/13/2022 - 06/17/2022 Hospital Admission   The patient was admitted to the hospital and found to have diarrhea, diverticulitis and pancytopenia   06/13/2022 Imaging   Colonic diverticulosis. Inflammatory stranding around the mid sigmoid colon compatible with active diverticulitis.   Interval resection of the previously seen lower pelvic solid and cystic mass and resolution of ascites.   Aortic atherosclerosis.   Severely atrophic left kidney, stable since prior study.   06/30/2022 Tumor Marker   Patient's tumor was tested for the following markers: CA-125. Results of the tumor marker test revealed 15.   07/21/2022 Tumor  Marker   Patient's tumor was tested for the following markers: CA-125. Results of the tumor marker test revealed 11.4.   08/12/2022 Tumor Marker   Patient's tumor was tested for the following markers: CA-125. Results of the tumor marker test revealed 9.7.     PHYSICAL EXAMINATION: ECOG PERFORMANCE STATUS: 2 - Symptomatic, <50% confined to bed  Vitals:   08/26/22 1057  BP: 136/64  Pulse: 90  Temp: 99.9 F (37.7 C)  SpO2: 98%   Filed Weights   08/26/22 1057  Weight: 155 lb 9.6 oz (70.6 kg)    GENERAL:alert, no distress and comfortable  NEURO: alert  & oriented x 3 with fluent speech, no focal motor/sensory deficits  LABORATORY DATA:  I have reviewed the data as listed    Component Value Date/Time   NA 129 (L) 08/23/2022 2013   NA 134 06/01/2022 0848   K 4.2 08/23/2022 2013   CL 98 08/23/2022 2013   CO2 21 (L) 08/23/2022 2013   GLUCOSE 98 08/23/2022 2013   BUN 16 08/23/2022 2013   BUN 7 (L) 06/01/2022 0848   CREATININE 0.90 08/23/2022 2013   CREATININE 0.93 08/10/2022 0857   CREATININE 0.99 02/05/2013 0757   CALCIUM 8.8 (L) 08/23/2022 2013   PROT 5.7 (L) 08/23/2022 2013   PROT 6.0 06/01/2022 0848   ALBUMIN 3.5 08/23/2022 2013   ALBUMIN 3.6 (L) 06/01/2022 0848   AST 13 (L) 08/23/2022 2013   AST 12 (L) 08/10/2022 0857   ALT 10 08/23/2022 2013   ALT 11 08/10/2022 0857   ALKPHOS 37 (L) 08/23/2022 2013   BILITOT 0.4 08/23/2022 2013   BILITOT 0.3 08/10/2022 0857   GFRNONAA >60 08/23/2022 2013   GFRNONAA >60 08/10/2022 0857   GFRNONAA 59 (L) 02/05/2013 0757   GFRAA 61 05/20/2020 0850   GFRAA 68 02/05/2013 0757    No results found for: "SPEP", "UPEP"  Lab Results  Component Value Date   WBC 6.1 08/26/2022   NEUTROABS 4.9 08/26/2022   HGB 7.9 (L) 08/26/2022   HCT 23.9 (L) 08/26/2022   MCV 98.0 08/26/2022   PLT 180 08/26/2022      Chemistry      Component Value Date/Time   NA 129 (L) 08/23/2022 2013   NA 134 06/01/2022 0848   K 4.2 08/23/2022 2013   CL 98 08/23/2022 2013   CO2 21 (L) 08/23/2022 2013   BUN 16 08/23/2022 2013   BUN 7 (L) 06/01/2022 0848   CREATININE 0.90 08/23/2022 2013   CREATININE 0.93 08/10/2022 0857   CREATININE 0.99 02/05/2013 0757      Component Value Date/Time   CALCIUM 8.8 (L) 08/23/2022 2013   ALKPHOS 37 (L) 08/23/2022 2013   AST 13 (L) 08/23/2022 2013   AST 12 (L) 08/10/2022 0857   ALT 10 08/23/2022 2013   ALT 11 08/10/2022 0857   BILITOT 0.4 08/23/2022 2013   BILITOT 0.3 08/10/2022 0857       RADIOGRAPHIC STUDIES: I personally reviewed her imaging studies I have  personally reviewed the radiological images as listed and agreed with the findings in the report. CT ABDOMEN PELVIS WO CONTRAST  Result Date: 08/23/2022 CLINICAL DATA:  Abdominal pain, acute, non localized, colovaginal fistula on prior imaging with worsening abdominal pain. Urinary retention EXAM: CT ABDOMEN AND PELVIS WITHOUT CONTRAST TECHNIQUE: Multidetector CT imaging of the abdomen and pelvis was performed following the standard protocol without IV contrast. RADIATION DOSE REDUCTION: This exam was performed according to the departmental dose-optimization program which includes  automated exposure control, adjustment of the mA and/or kV according to patient size and/or use of iterative reconstruction technique. COMPARISON:  CT abdomen and pelvis 08/14/2022 FINDINGS: Lower chest: No acute abnormality. Hepatobiliary: No suspicious focal hepatic lesion. Mild distention of the gallbladder. Unchanged dilation of the common bile duct measuring up to 14 mm. No radiopaque stones or evidence of cholecystitis. Pancreas: Unremarkable. Spleen: Unremarkable. Adrenals/Urinary Tract: Normal adrenal glands. Severe atrophy of the left kidney. Normal appearance of the right kidney. No urinary calculi. Slight decrease in pelviectasis and dilation of the left ureter. Increased large volume gas within the bladder. The inflamed sigmoid colon abuts the superior left aspect of the bladder. Given the increased large amount of gas a colovesical fistula is difficult to exclude. Stomach/Bowel: Diverticulosis and wall thickening of the sigmoid colon with adjacent inflammatory change is similar to slightly decreased from 08/14/2022. Small amount of pneumatosis in the sigmoid colon near the area of the colovaginal fistula. The previous collection of gas involving the wall of the sigmoid colon has decreased. There is a small amount of residual gas in the area of the left vaginal cuff (series 2/image 67). There is again a soft tissue tract  extending from the vaginal cuff to the sigmoid colon (circa series 5/image 39) though no gas is seen within this tract. Normal caliber large and small bowel. Moderate colonic stool load mixed with contrast. Decreased size and wall thickening about the large diverticulum in the proximal transverse colon (series 5/image 52). Redemonstrated large duodenal diverticulum. Vascular/Lymphatic: Aortic atherosclerosis. No enlarged abdominal or pelvic lymph nodes. Reproductive: Hysterectomy.  No adnexal mass. Other: Small volume free fluid in the pelvis. No free intraperitoneal air. Musculoskeletal: No acute osseous abnormality. IMPRESSION: 1. Increased large volume gas within the bladder. This raises concern for colovesical fistula. Consider CT cystogram for further evaluation. 2. Similar to slightly decreased inflammatory changes surrounding the sigmoid colon. The previous collection of gas involving the wall of the sigmoid colon has decreased. There is a small amount of residual gas in the area of the left vaginal cuff. There is again a soft tissue tract extending from the vaginal cuff to the sigmoid colon, though no gas is seen within this tract. 3. Decreased size and wall thickening about the large diverticulum in the proximal transverse colon. 4. Slight decrease in pelviectasis and dilation of the left ureter. Aortic Atherosclerosis (ICD10-I70.0). Electronically Signed   By: Minerva Fester M.D.   On: 08/23/2022 21:39   CT ABDOMEN PELVIS WO CONTRAST  Result Date: 08/14/2022 CLINICAL DATA:  Pelvic pain; history of ovarian cancer EXAM: CT ABDOMEN AND PELVIS WITHOUT CONTRAST TECHNIQUE: Multidetector CT imaging of the abdomen and pelvis was performed following the standard protocol without IV contrast. RADIATION DOSE REDUCTION: This exam was performed according to the departmental dose-optimization program which includes automated exposure control, adjustment of the mA and/or kV according to patient size and/or use of  iterative reconstruction technique. COMPARISON:  Multiple priors, most recent CT abdomen and pelvis dated June 13, 2022 FINDINGS: Lower chest: No acute abnormality. Hepatobiliary: Stable small low-attenuation lesion of the right lobe of the liver measuring 7 mm on series 2, image 14, likely a simple cysts. Gallbladder is unremarkable. Biliary ductal dilation. Pancreas: Unremarkable. No pancreatic ductal dilatation or surrounding inflammatory changes. Spleen: Normal in size without focal abnormality. Adrenals/Urinary Tract: Bilateral adrenal glands are unremarkable. Atrophic left kidney. New pelviectasis and mild dilation of the left ureter. Low-attenuation lesions of the left kidney, largest are compatible with simple cysts,  others are too small to accurately characterize, no specific follow-up imaging needed. Compensatory hypertrophy of the right kidney. No hydronephrosis. Wall thickening of the left lateral wall of the urinary bladder with air seen within the urinary bladder. Stomach/Bowel: Contrast material is seen throughout the small and large bowel. Wall thickening of the sigmoid colon with adjacent inflammatory change, decreased when compared with prior exam. New focal collection of gas involving the wall of the sigmoid colon which extends inferiorly to the area of the left vaginal cuff with contrast material seen within the vaginal cuff. Increased wall thickening of a large colonic diverticulum of the proximal transverse colon, best seen on series 5, image 61. Large duodenal diverticulum. No evidence of obstruction. Vascular/Lymphatic: Aortic atherosclerosis. No enlarged abdominal or pelvic lymph nodes. Reproductive: No adnexal masses. Other: Postsurgical changes of the anterior abdomen. Musculoskeletal: No acute or significant osseous findings. IMPRESSION: 1. Wall thickening of the sigmoid colon with adjacent inflammatory change, overall improved when compared with prior exam. New focal collection of gas  involving the wall of the sigmoid colon which extends inferiorly to the area of the left vaginal cuff with contrast material seen within the vaginal cuff. 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 2. Wall thickening of the left lateral wall of the urinary bladder with air seen within the urinary bladder, findings raise concern for additional area of fistulization to the bladder. CT cystogram could be performed for better evaluation. 3. New pelviectasis and mild dilation of the left ureter, likely secondary to inflammatory change at the area of the left ureterovesicular junction. Chronic atrophy of the left kidney. 4. Increased wall thickening of a large colonic diverticulum of the proximal transverse colon, likely due to diverticulitis. 5. Aortic Atherosclerosis (ICD10-I70.0). Electronically Signed   By: Allegra Lai M.D.   On: 08/14/2022 17:06

## 2022-08-26 NOTE — Telephone Encounter (Signed)
Called [facility] regarding referral.  They have scheduled Pat to see Dr. Berneice Heinrich on 08/30/22 at 8:00.  Hattie Perch and gave her the appointment details.  She verbalized understanding and agreement.

## 2022-08-26 NOTE — Assessment & Plan Note (Signed)
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

## 2022-08-26 NOTE — Patient Instructions (Signed)

## 2022-08-27 ENCOUNTER — Encounter (HOSPITAL_COMMUNITY): Payer: Self-pay | Admitting: Internal Medicine

## 2022-08-27 ENCOUNTER — Telehealth: Payer: Self-pay | Admitting: Hematology and Oncology

## 2022-08-27 ENCOUNTER — Inpatient Hospital Stay (HOSPITAL_COMMUNITY)
Admission: EM | Admit: 2022-08-27 | Discharge: 2022-09-08 | DRG: 854 | Disposition: A | Discharge status: Home Health | Payer: Medicare Other | Attending: Family Medicine | Admitting: Family Medicine

## 2022-08-27 ENCOUNTER — Other Ambulatory Visit: Payer: Medicare Other

## 2022-08-27 ENCOUNTER — Ambulatory Visit: Payer: Medicare Other | Admitting: Hematology and Oncology

## 2022-08-27 ENCOUNTER — Other Ambulatory Visit: Payer: Self-pay

## 2022-08-27 ENCOUNTER — Inpatient Hospital Stay (HOSPITAL_COMMUNITY): Payer: Medicare Other

## 2022-08-27 DIAGNOSIS — N322 Vesical fistula, not elsewhere classified: Secondary | ICD-10-CM | POA: Diagnosis present

## 2022-08-27 DIAGNOSIS — Z881 Allergy status to other antibiotic agents status: Secondary | ICD-10-CM

## 2022-08-27 DIAGNOSIS — Z8543 Personal history of malignant neoplasm of ovary: Secondary | ICD-10-CM

## 2022-08-27 DIAGNOSIS — I739 Peripheral vascular disease, unspecified: Secondary | ICD-10-CM | POA: Diagnosis present

## 2022-08-27 DIAGNOSIS — N1831 Chronic kidney disease, stage 3a: Secondary | ICD-10-CM | POA: Diagnosis present

## 2022-08-27 DIAGNOSIS — Z7982 Long term (current) use of aspirin: Secondary | ICD-10-CM

## 2022-08-27 DIAGNOSIS — K5732 Diverticulitis of large intestine without perforation or abscess without bleeding: Secondary | ICD-10-CM | POA: Diagnosis present

## 2022-08-27 DIAGNOSIS — R3989 Other symptoms and signs involving the genitourinary system: Secondary | ICD-10-CM | POA: Diagnosis not present

## 2022-08-27 DIAGNOSIS — N823 Fistula of vagina to large intestine: Secondary | ICD-10-CM | POA: Diagnosis not present

## 2022-08-27 DIAGNOSIS — Z79890 Hormone replacement therapy (postmenopausal): Secondary | ICD-10-CM

## 2022-08-27 DIAGNOSIS — Z86010 Personal history of colonic polyps: Secondary | ICD-10-CM

## 2022-08-27 DIAGNOSIS — K56609 Unspecified intestinal obstruction, unspecified as to partial versus complete obstruction: Secondary | ICD-10-CM | POA: Diagnosis not present

## 2022-08-27 DIAGNOSIS — K5730 Diverticulosis of large intestine without perforation or abscess without bleeding: Secondary | ICD-10-CM | POA: Diagnosis not present

## 2022-08-27 DIAGNOSIS — E871 Hypo-osmolality and hyponatremia: Secondary | ICD-10-CM | POA: Diagnosis present

## 2022-08-27 DIAGNOSIS — I129 Hypertensive chronic kidney disease with stage 1 through stage 4 chronic kidney disease, or unspecified chronic kidney disease: Secondary | ICD-10-CM | POA: Diagnosis present

## 2022-08-27 DIAGNOSIS — M8580 Other specified disorders of bone density and structure, unspecified site: Secondary | ICD-10-CM | POA: Diagnosis present

## 2022-08-27 DIAGNOSIS — D849 Immunodeficiency, unspecified: Secondary | ICD-10-CM | POA: Diagnosis present

## 2022-08-27 DIAGNOSIS — D62 Acute posthemorrhagic anemia: Secondary | ICD-10-CM | POA: Diagnosis present

## 2022-08-27 DIAGNOSIS — E039 Hypothyroidism, unspecified: Secondary | ICD-10-CM | POA: Diagnosis present

## 2022-08-27 DIAGNOSIS — D631 Anemia in chronic kidney disease: Secondary | ICD-10-CM | POA: Diagnosis present

## 2022-08-27 DIAGNOSIS — Z5331 Laparoscopic surgical procedure converted to open procedure: Secondary | ICD-10-CM

## 2022-08-27 DIAGNOSIS — M1990 Unspecified osteoarthritis, unspecified site: Secondary | ICD-10-CM | POA: Diagnosis present

## 2022-08-27 DIAGNOSIS — N1370 Vesicoureteral-reflux, unspecified: Secondary | ICD-10-CM | POA: Diagnosis not present

## 2022-08-27 DIAGNOSIS — K5900 Constipation, unspecified: Secondary | ICD-10-CM | POA: Diagnosis present

## 2022-08-27 DIAGNOSIS — Z86718 Personal history of other venous thrombosis and embolism: Secondary | ICD-10-CM | POA: Diagnosis not present

## 2022-08-27 DIAGNOSIS — I10 Essential (primary) hypertension: Secondary | ICD-10-CM | POA: Diagnosis present

## 2022-08-27 DIAGNOSIS — Z803 Family history of malignant neoplasm of breast: Secondary | ICD-10-CM

## 2022-08-27 DIAGNOSIS — M79605 Pain in left leg: Secondary | ICD-10-CM | POA: Diagnosis not present

## 2022-08-27 DIAGNOSIS — R350 Frequency of micturition: Secondary | ICD-10-CM | POA: Diagnosis not present

## 2022-08-27 DIAGNOSIS — E785 Hyperlipidemia, unspecified: Secondary | ICD-10-CM | POA: Diagnosis present

## 2022-08-27 DIAGNOSIS — N1330 Unspecified hydronephrosis: Secondary | ICD-10-CM | POA: Diagnosis present

## 2022-08-27 DIAGNOSIS — E861 Hypovolemia: Secondary | ICD-10-CM | POA: Diagnosis present

## 2022-08-27 DIAGNOSIS — Z87440 Personal history of urinary (tract) infections: Secondary | ICD-10-CM

## 2022-08-27 DIAGNOSIS — N898 Other specified noninflammatory disorders of vagina: Secondary | ICD-10-CM | POA: Diagnosis present

## 2022-08-27 DIAGNOSIS — K5720 Diverticulitis of large intestine with perforation and abscess without bleeding: Secondary | ICD-10-CM | POA: Diagnosis not present

## 2022-08-27 DIAGNOSIS — Z880 Allergy status to penicillin: Secondary | ICD-10-CM | POA: Diagnosis not present

## 2022-08-27 DIAGNOSIS — Z87891 Personal history of nicotine dependence: Secondary | ICD-10-CM | POA: Diagnosis not present

## 2022-08-27 DIAGNOSIS — Z9011 Acquired absence of right breast and nipple: Secondary | ICD-10-CM

## 2022-08-27 DIAGNOSIS — Z8249 Family history of ischemic heart disease and other diseases of the circulatory system: Secondary | ICD-10-CM

## 2022-08-27 DIAGNOSIS — N3289 Other specified disorders of bladder: Secondary | ICD-10-CM | POA: Diagnosis not present

## 2022-08-27 DIAGNOSIS — R1032 Left lower quadrant pain: Secondary | ICD-10-CM | POA: Diagnosis present

## 2022-08-27 DIAGNOSIS — Z853 Personal history of malignant neoplasm of breast: Secondary | ICD-10-CM

## 2022-08-27 DIAGNOSIS — Z886 Allergy status to analgesic agent status: Secondary | ICD-10-CM

## 2022-08-27 DIAGNOSIS — Z888 Allergy status to other drugs, medicaments and biological substances status: Secondary | ICD-10-CM

## 2022-08-27 DIAGNOSIS — E860 Dehydration: Secondary | ICD-10-CM | POA: Diagnosis present

## 2022-08-27 DIAGNOSIS — A419 Sepsis, unspecified organism: Secondary | ICD-10-CM | POA: Diagnosis present

## 2022-08-27 DIAGNOSIS — Z885 Allergy status to narcotic agent status: Secondary | ICD-10-CM

## 2022-08-27 DIAGNOSIS — Z90710 Acquired absence of both cervix and uterus: Secondary | ICD-10-CM

## 2022-08-27 DIAGNOSIS — L988 Other specified disorders of the skin and subcutaneous tissue: Secondary | ICD-10-CM

## 2022-08-27 DIAGNOSIS — C561 Malignant neoplasm of right ovary: Secondary | ICD-10-CM | POA: Diagnosis present

## 2022-08-27 DIAGNOSIS — R300 Dysuria: Secondary | ICD-10-CM | POA: Diagnosis not present

## 2022-08-27 DIAGNOSIS — Z91041 Radiographic dye allergy status: Secondary | ICD-10-CM

## 2022-08-27 DIAGNOSIS — Z9221 Personal history of antineoplastic chemotherapy: Secondary | ICD-10-CM

## 2022-08-27 DIAGNOSIS — Z91040 Latex allergy status: Secondary | ICD-10-CM

## 2022-08-27 DIAGNOSIS — Z79899 Other long term (current) drug therapy: Secondary | ICD-10-CM

## 2022-08-27 DIAGNOSIS — N261 Atrophy of kidney (terminal): Secondary | ICD-10-CM | POA: Diagnosis not present

## 2022-08-27 DIAGNOSIS — N6489 Other specified disorders of breast: Secondary | ICD-10-CM

## 2022-08-27 DIAGNOSIS — N321 Vesicointestinal fistula: Secondary | ICD-10-CM | POA: Diagnosis present

## 2022-08-27 DIAGNOSIS — R109 Unspecified abdominal pain: Principal | ICD-10-CM

## 2022-08-27 DIAGNOSIS — D6481 Anemia due to antineoplastic chemotherapy: Secondary | ICD-10-CM

## 2022-08-27 LAB — URINALYSIS, W/ REFLEX TO CULTURE (INFECTION SUSPECTED)
Bilirubin Urine: NEGATIVE
Glucose, UA: NEGATIVE mg/dL
Ketones, ur: NEGATIVE mg/dL
Nitrite: NEGATIVE
Non Squamous Epithelial: >50 — AB
Protein, ur: 30 mg/dL — AB
RBC / HPF: >50 RBC/hpf (ref 0–5)
Specific Gravity, Urine: 1.005 (ref 1.005–1.030)
WBC, UA: >50 WBC/hpf (ref 0–5)
pH: 7 (ref 5.0–8.0)

## 2022-08-27 LAB — BASIC METABOLIC PANEL
Anion gap: 8 (ref 5–15)
BUN: 14 mg/dL (ref 8–23)
CO2: 22 mmol/L (ref 22–32)
Calcium: 8.1 mg/dL — ABNORMAL LOW (ref 8.9–10.3)
Chloride: 97 mmol/L — ABNORMAL LOW (ref 98–111)
Creatinine, Ser: 0.95 mg/dL (ref 0.44–1.00)
GFR, Estimated: >60 mL/min (ref >60)
Glucose, Bld: 135 mg/dL — ABNORMAL HIGH (ref 70–99)
Potassium: 3.9 mmol/L (ref 3.5–5.1)
Sodium: 127 mmol/L — ABNORMAL LOW (ref 135–145)

## 2022-08-27 LAB — CBC WITH DIFFERENTIAL/PLATELET
Abs Immature Granulocytes: 0.03 10*3/uL (ref 0.00–0.07)
Basophils Absolute: 0 10*3/uL (ref 0.0–0.1)
Basophils Relative: 0 %
Eosinophils Absolute: 0 10*3/uL (ref 0.0–0.5)
Eosinophils Relative: 0 %
HCT: 21.8 % — ABNORMAL LOW (ref 36.0–46.0)
Hemoglobin: 7.2 g/dL — ABNORMAL LOW (ref 12.0–15.0)
Immature Granulocytes: 1 %
Lymphocytes Relative: 6 %
Lymphs Abs: 0.3 10*3/uL — ABNORMAL LOW (ref 0.7–4.0)
MCH: 32.7 pg (ref 26.0–34.0)
MCHC: 33 g/dL (ref 30.0–36.0)
MCV: 99.1 fL (ref 80.0–100.0)
Monocytes Absolute: 0.6 10*3/uL (ref 0.1–1.0)
Monocytes Relative: 11 %
Neutro Abs: 4.4 10*3/uL (ref 1.7–7.7)
Neutrophils Relative %: 82 %
Platelets: 133 10*3/uL — ABNORMAL LOW (ref 150–400)
RBC: 2.2 MIL/uL — ABNORMAL LOW (ref 3.87–5.11)
RDW: 16.7 % — ABNORMAL HIGH (ref 11.5–15.5)
WBC: 5.4 10*3/uL (ref 4.0–10.5)
nRBC: 0 % (ref 0.0–0.2)

## 2022-08-27 LAB — LACTIC ACID, PLASMA
Lactic Acid, Venous: 0.6 mmol/L (ref 0.5–1.9)
Lactic Acid, Venous: 0.8 mmol/L (ref 0.5–1.9)

## 2022-08-27 MED ORDER — ONDANSETRON HCL 4 MG/2ML IJ SOLN
4.0000 mg | Freq: Once | INTRAMUSCULAR | Status: AC
  Administered 2022-08-27: 4 mg via INTRAVENOUS
  Filled 2022-08-27: qty 2

## 2022-08-27 MED ORDER — SODIUM CHLORIDE 0.9 % IV SOLN
1.0000 g | INTRAVENOUS | Status: DC
  Administered 2022-08-27: 1 g via INTRAVENOUS
  Filled 2022-08-27: qty 10

## 2022-08-27 MED ORDER — LACTATED RINGERS IV SOLN: INTRAVENOUS | Status: DC

## 2022-08-27 MED ORDER — ACETAMINOPHEN 650 MG RE SUPP: 650.0000 mg | Freq: Four times a day (QID) | RECTAL | Status: DC | PRN

## 2022-08-27 MED ORDER — SODIUM CHLORIDE 0.9% FLUSH: 10.0000 mL | INTRAVENOUS | Status: DC | PRN

## 2022-08-27 MED ORDER — METRONIDAZOLE 500 MG/100ML IV SOLN
500.0000 mg | Freq: Two times a day (BID) | INTRAVENOUS | Status: DC
  Administered 2022-08-27 – 2022-09-04 (×17): 500 mg via INTRAVENOUS
  Filled 2022-08-27 (×19): qty 100

## 2022-08-27 MED ORDER — ONDANSETRON HCL 4 MG PO TABS
4.0000 mg | ORAL_TABLET | Freq: Four times a day (QID) | ORAL | Status: DC | PRN
  Administered 2022-08-30 – 2022-09-07 (×5): 4 mg via ORAL
  Filled 2022-08-27 (×5): qty 1

## 2022-08-27 MED ORDER — ACETAMINOPHEN 325 MG PO TABS
650.0000 mg | ORAL_TABLET | Freq: Four times a day (QID) | ORAL | Status: DC | PRN
  Administered 2022-09-04: 650 mg via ORAL
  Filled 2022-08-27: qty 2

## 2022-08-27 MED ORDER — SODIUM CHLORIDE 0.9% FLUSH
10.0000 mL | Freq: Two times a day (BID) | INTRAVENOUS | Status: DC
  Administered 2022-08-27 – 2022-09-08 (×21): 10 mL

## 2022-08-27 MED ORDER — HYDROMORPHONE HCL 1 MG/ML IJ SOLN
0.5000 mg | INTRAMUSCULAR | Status: DC | PRN
  Administered 2022-08-27 – 2022-09-02 (×11): 0.5 mg via INTRAVENOUS
  Filled 2022-08-27 (×11): qty 0.5

## 2022-08-27 MED ORDER — LACTATED RINGERS IV BOLUS
1000.0000 mL | Freq: Once | INTRAVENOUS | Status: AC
  Administered 2022-08-27: 1000 mL via INTRAVENOUS

## 2022-08-27 MED ORDER — OXYCODONE-ACETAMINOPHEN 5-325 MG PO TABS
1.0000 | ORAL_TABLET | Freq: Three times a day (TID) | ORAL | Status: DC
  Administered 2022-08-27 – 2022-09-01 (×14): 1 via ORAL
  Filled 2022-08-27 (×15): qty 1

## 2022-08-27 MED ORDER — CHLORHEXIDINE GLUCONATE CLOTH 2 % EX PADS
6.0000 | MEDICATED_PAD | Freq: Every day | CUTANEOUS | Status: DC
  Administered 2022-08-27 – 2022-09-08 (×13): 6 via TOPICAL

## 2022-08-27 MED ORDER — SODIUM CHLORIDE 0.9 % IV SOLN
2.0000 g | INTRAVENOUS | Status: DC
  Administered 2022-08-27 – 2022-09-04 (×9): 2 g via INTRAVENOUS
  Filled 2022-08-27 (×9): qty 20

## 2022-08-27 MED ORDER — ONDANSETRON HCL 4 MG/2ML IJ SOLN
4.0000 mg | Freq: Four times a day (QID) | INTRAMUSCULAR | Status: DC | PRN
  Administered 2022-08-27 – 2022-09-05 (×7): 4 mg via INTRAVENOUS
  Filled 2022-08-27 (×7): qty 2

## 2022-08-27 MED ORDER — HYDROMORPHONE HCL 1 MG/ML IJ SOLN
0.5000 mg | Freq: Once | INTRAMUSCULAR | Status: AC
  Administered 2022-08-27: 0.5 mg via INTRAVENOUS
  Filled 2022-08-27: qty 1

## 2022-08-27 MED ORDER — MORPHINE SULFATE (PF) 4 MG/ML IV SOLN
6.0000 mg | Freq: Once | INTRAVENOUS | Status: AC
  Administered 2022-08-27: 6 mg via INTRAVENOUS
  Filled 2022-08-27: qty 2

## 2022-08-27 NOTE — ED Notes (Signed)
ED TO INPATIENT HANDOFF REPOCrist InfanteRT  ED Nurse Name and Phone #: Tia, RN [phone]  S Name/Age/Gender April Weber 77 y.o. female Room/Bed: WA13/WA13  Code Status   Code Status: Prior  Home/SNF/Other Home Patient oriented to: self, place, time, and situation Is this baseline? Yes   Triage Complete: Triage complete  Chief Complaint Colovesical fistula [N32.1]  Triage Note Patient brought in from home with c/o ABD pain since the beginning of April, low grade fever,  currently passing urine and stool through vagina, urge to urinate constantly with burning with urination. States she has surgery scheduled 4/30.   Allergies Allergies  Allergen Reactions   Paclitaxel Anaphylaxis   Emend [Fosaprepitant Dimeglumine] Other (See Comments)    3 minutes into emend infusion patient began having facial flushing and redness to bilateral hands and neck, and light headedness   Codeine Nausea And Vomiting   Nsaids Other (See Comments)    Told to avoid due to kidney function   Penicillins Hives   Vibra-Tab [Doxycycline] Hives   Iodine Rash   Latex Rash    Occasionally gets a localized rash on contact with certain latex products    Level of Care/Admitting Diagnosis ED Disposition     ED Disposition  Admit   Condition  --   Comment  Hospital Area: [facility] [[id number]]  Level of Care: Med-Surg [16]  May admit patient to [facility] or [facility] if equivalent level of care is available:: No  Covid Evaluation: Asymptomatic - no recent exposure (last 10 days) testing not required  Diagnosis: Colovesical fistula [147829]  Admitting Physician: Bobette Mo [[id number]]  Attending Physician: Bobette Mo [[id number]]  Certification:: I certify this patient will need inpatient services for at least 2 midnights  Estimated Length of Stay: 2          B Medical/Surgery History Past Medical History:  Diagnosis Date   Arthritis    Breast cancer 1980   RIGHT    Chronic kidney disease    has one kidney   DVT (deep venous thrombosis)    on RIGHT leg-hx of   Dyspnea    Hyperlipidemia    on meds   Hypertension    on meds   Osteopenia    Peripheral vascular disease    PONV (postoperative nausea and vomiting)    Seasonal allergies    Thyroid disease    on meds   Past Surgical History:  Procedure Laterality Date   ABDOMINAL HYSTERECTOMY     APPENDECTOMY     BREAST BIOPSY     BREAST LUMPECTOMY WITH RADIOACTIVE SEED LOCALIZATION Left 12/01/2021   Procedure: LEFT BREAST RADIOACTIVE SEED GUIDED LUMPECTOMY;  Surgeon: Abigail Miyamoto, MD;  Location: [facility];  Service: General;  Laterality: Left;  LMA   COLONOSCOPY  2011   Dr.[email or web address]-normal   DEBULKING N/A 03/30/2022   Procedure: TUMOR DEBULKING;  Surgeon: Carver Fila, MD;  Location: [facility];  Service: Gynecology;  Laterality: N/A;   EYE SURGERY     77 years old   IR IMAGING GUIDED PORT INSERTION  03/19/2022   LAPAROSCOPY N/A 03/30/2022   Procedure: LAPAROSCOPY DIAGNOSTIC;  Surgeon: Carver Fila, MD;  Location: [facility];  Service: Gynecology;  Laterality: N/A;   MASTECTOMY Right 1984   ROTATOR CUFF REPAIR     SALPINGOOPHORECTOMY Bilateral 03/30/2022   Procedure: OPEN SALPINGO OOPHORECTOMY;  Surgeon: Carver Fila, MD;  Location: [facility];  Service: Gynecology;  Laterality: Bilateral;  THYROID SURGERY Bilateral 1967   VARICOSE VEIN SURGERY     ablation   WISDOM TOOTH EXTRACTION       A IV Location/Drains/Wounds Patient Lines/Drains/Airways Status     Active Line/Drains/Airways     Name Placement date Placement time Site Days   Implanted Port Left Chest --  --  Chest  --   Incision - 1 Port Abdomen 1: Left;Lateral;Superior;Umbilicus 03/30/22  1126  -- 150            Intake/Output Last 24 hours  Intake/Output Summary (Last 24 hours) at 08/27/2022 1059 Last data filed at 08/27/2022 1051 Gross per 24 hour  Intake 999 ml  Output --  Net 999 ml     Labs/Imaging Results for orders placed or performed during the hospital encounter of 08/27/22 (from the past 48 hour(s))  CBC with Differential/Platelet     Status: Abnormal   Collection Time: 08/27/22  8:23 AM  Result Value Ref Range   WBC 5.4 4.0 - 10.5 K/uL   RBC 2.20 (L) 3.87 - 5.11 MIL/uL   Hemoglobin 7.2 (L) 12.0 - 15.0 g/dL   HCT 16.1 (L) 09.6 - 04.5 %   MCV 99.1 80.0 - 100.0 fL   MCH 32.7 26.0 - 34.0 pg   MCHC 33.0 30.0 - 36.0 g/dL   RDW 40.9 (H) 81.1 - 91.4 %   Platelets 133 (L) 150 - 400 K/uL   nRBC 0.0 0.0 - 0.2 %   Neutrophils Relative % 82 %   Neutro Abs 4.4 1.7 - 7.7 K/uL   Lymphocytes Relative 6 %   Lymphs Abs 0.3 (L) 0.7 - 4.0 K/uL   Monocytes Relative 11 %   Monocytes Absolute 0.6 0.1 - 1.0 K/uL   Eosinophils Relative 0 %   Eosinophils Absolute 0.0 0.0 - 0.5 K/uL   Basophils Relative 0 %   Basophils Absolute 0.0 0.0 - 0.1 K/uL   Immature Granulocytes 1 %   Abs Immature Granulocytes 0.03 0.00 - 0.07 K/uL    Comment: Performed at [facility], [address], [place], [place] [address]  Basic metabolic panel     Status: Abnormal   Collection Time: 08/27/22  8:23 AM  Result Value Ref Range   Sodium 127 (L) 135 - 145 mmol/L   Potassium 3.9 3.5 - 5.1 mmol/L   Chloride 97 (L) 98 - 111 mmol/L   CO2 22 22 - 32 mmol/L   Glucose, Bld 135 (H) 70 - 99 mg/dL    Comment: Glucose reference range applies only to samples taken after fasting for at least 8 hours.   BUN 14 8 - 23 mg/dL   Creatinine, Ser 6.21 0.44 - 1.00 mg/dL   Calcium 8.1 (L) 8.9 - 10.3 mg/dL   GFR, Estimated >30 >86 mL/min    Comment: (NOTE) Calculated using the CKD-EPI Creatinine Equation (2021)    Anion gap 8 5 - 15    Comment: Performed at [facility], [address] [place], [place] [address]   No results found.  Pending Labs Unresulted Labs (From admission, onward)     Start     Ordered   08/27/22 1020  Lactic acid, plasma  Now then every 2 hours,    R (with STAT occurrences)      08/27/22 1021   08/27/22 1020  Culture, blood (Routine X 2) w Reflex to ID Panel  BLOOD CULTURE X 2,   R (with STAT occurrences)     Question:  Patient immune status  Answer:  Normal   08/27/22 1021            Vitals/Pain Today's Vitals   08/27/22 0743 08/27/22 0849 08/27/22 1008 08/27/22 1056  BP:      Pulse:      Resp:      Temp:      TempSrc:      SpO2:      Weight: 71.7 kg     Height: 5\' 6"  (1.676 m)     PainSc: 9  8  7   Asleep    Isolation Precautions No active isolations  Medications Medications  lactated ringers infusion ( Intravenous New Bag/Given 08/27/22 0852)  cefTRIAXone (ROCEPHIN) 1 g in sodium chloride 0.9 % 100 mL IVPB (1 g Intravenous New Bag/Given 08/27/22 1038)  metroNIDAZOLE (FLAGYL) IVPB 500 mg (500 mg Intravenous New Bag/Given 08/27/22 1047)  lactated ringers bolus 1,000 mL (0 mLs Intravenous Stopped 08/27/22 1051)  ondansetron (ZOFRAN) injection 4 mg (4 mg Intravenous Given 08/27/22 0822)  morphine (PF) 4 MG/ML injection 6 mg (6 mg Intravenous Given 08/27/22 6045)  HYDROmorphone (DILAUDID) injection 0.5 mg (0.5 mg Intravenous Given 08/27/22 0914)    Mobility walks     Focused Assessments Neuro Assessment Handoff:  Swallow screen pass?  N/A         Neuro Assessment: Within Defined Limits Neuro Checks:      Has TPA been given? No If patient is a Neuro Trauma and patient is going to OR before floor call report to 4N Charge nurse: [phone] or [phone]   R Recommendations: See Admitting Provider Note  Report given to:   Additional Notes:

## 2022-08-27 NOTE — Telephone Encounter (Signed)
Left patient a vm regarding upcoming appointment  

## 2022-08-27 NOTE — ED Provider Notes (Signed)
[place] EMERGENCY DEPARTMENT AT [facility] Provider Note   CSN: [id number] Arrival date & time: 08/27/22  7829     History  Chief Complaint  Patient presents with   Abdominal Pain    April Weber is a 77 y.o. female.  77 year old female presents with persistent abdominal pain.  Has a history of ovarian cancer and has a colovesicular as well as a colovaginal fistula.  Patient has not been seen by surgery for this yet.  States that she has had increasing stool out of her vagina.  Endorses low-grade fever and worsening diffuse abdominal pain worse in her left lower quadrant.  Has been using hydrocodone without relief.  Endorses nausea but no vomiting      Home Medications Prior to Admission medications   Medication Sig Start Date End Date Taking? Authorizing Provider  acetaminophen (TYLENOL) 650 MG CR tablet Take 1,300 mg by mouth as needed for pain.    [provider]  aspirin EC 81 MG tablet Take 81 mg by mouth at bedtime. Swallow whole.    [provider]  atenolol (TENORMIN) 25 MG tablet Take 0.5 tablets (12.5 mg total) by mouth 2 (two) times daily. 06/01/22   Raliegh Ip, DO  Calcium Carb-Cholecalciferol (CALCIUM + VITAMIN D3 PO) Take 1 tablet by mouth 2 (two) times daily.    [provider]  EPINEPHrine 0.3 mg/0.3 mL IJ SOAJ injection Inject 0.3 mg into the muscle as needed for anaphylaxis. 05/24/22   Daphine Deutscher Mary-Margaret, FNP  HYDROcodone-acetaminophen (NORCO) 5-325 MG tablet Take 1 tablet by mouth every 6 (six) hours as needed for moderate pain. 08/24/22   Sloan Leiter, DO  HYDROcodone-acetaminophen (NORCO/VICODIN) 5-325 MG tablet Take 1 tablet by mouth every 6 (six) hours as needed. 08/14/22   Arby Barrette, MD  levothyroxine (SYNTHROID) 100 MCG tablet Take 1 tablet (100 mcg total) by mouth in the morning. 06/01/22   Raliegh Ip, DO  loratadine (CLARITIN) 10 MG tablet Take 10 mg by mouth daily as needed for allergies.     [provider]  Multiple Vitamin (MULTIVITAMIN) tablet Take 1 tablet by mouth in the morning.    [provider]  olaparib (LYNPARZA) 100 MG tablet Take 1 tablet (100 mg total) by mouth 2 (two) times daily. Swallow whole. May take with food to decrease nausea and vomiting. 08/10/22   [name], MD  promethazine (PHENERGAN) 12.5 MG tablet Take 12.5 mg by mouth every 6 (six) hours as needed for nausea or vomiting. Patient not sure of dose    [provider]  sulfamethoxazole-trimethoprim (BACTRIM) 400-80 MG tablet Take 1 tablet by mouth 2 (two) times daily. 08/26/22   [name], MD      Allergies    Paclitaxel, Emend [fosaprepitant dimeglumine], Codeine, Nsaids, Penicillins, Vibra-tab [doxycycline], Iodine, and Latex    Review of Systems   Review of Systems  All other systems reviewed and are negative.   Physical Exam Updated Vital Signs BP (!) 150/87 (BP Location: Left Arm)   Pulse 88   Temp 99 F (37.2 C) (Oral)   Resp 16   Ht 1.676 m ( )   Wt 71.7 kg   SpO2 100%   BMI 25.50 kg/m  Physical Exam Vitals and nursing note reviewed.  Constitutional:      General: She is not in acute distress.    Appearance: Normal appearance. She is well-developed. She is not toxic-appearing.  HENT:     Head: Normocephalic and  atraumatic.  Eyes:     General: Lids are normal.     Conjunctiva/sclera: Conjunctivae normal.     Pupils: Pupils are equal, round, and reactive to light.  Neck:     Thyroid: No thyroid mass.     Trachea: No tracheal deviation.  Cardiovascular:     Rate and Rhythm: Normal rate and regular rhythm.     Heart sounds: Normal heart sounds. No murmur heard.    No gallop.  Pulmonary:     Effort: Pulmonary effort is normal. No respiratory distress.     Breath sounds: Normal breath sounds. No stridor. No decreased breath sounds, wheezing, rhonchi or rales.  Abdominal:     General: There is no distension.     Palpations: Abdomen is soft.      Tenderness: There is no abdominal tenderness. There is no rebound.    Musculoskeletal:        General: No tenderness. Normal range of motion.     Cervical back: Normal range of motion and neck supple.  Skin:    General: Skin is warm and dry.     Findings: No abrasion or rash.  Neurological:     Mental Status: She is alert and oriented to person, place, and time. Mental status is at baseline.     GCS: GCS eye subscore is 4. GCS verbal subscore is 5. GCS motor subscore is 6.     Cranial Nerves: No cranial nerve deficit.     Sensory: No sensory deficit.     Motor: Motor function is intact.  Psychiatric:        Attention and Perception: Attention normal.        Speech: Speech normal.        Behavior: Behavior normal.    ED Results / Procedures / Treatments   Labs (all labs ordered are listed, but only abnormal results are displayed) Labs Reviewed  CBC WITH DIFFERENTIAL/PLATELET  BASIC METABOLIC PANEL    EKG None  Radiology No results found.  Procedures Procedures    Medications Ordered in ED Medications  lactated ringers bolus 1,000 mL (has no administration in time range)  lactated ringers infusion (has no administration in time range)  ondansetron (ZOFRAN) injection 4 mg (has no administration in time range)  morphine (PF) 4 MG/ML injection 6 mg (has no administration in time range)    ED Course/ Medical Decision Making/ A&P                             Medical Decision Making Amount and/or Complexity of Data Reviewed Labs: ordered.  Risk Prescription drug management.   Patient treated for abdominal pain here.  Labs are reassuring.  She does have low-grade temperature however.  Discussed with her oncologist, Dr. Bertis Ruddy who recommends that patient be admitted for further workup.  I also discussed with Dr. Carolynne Edouard with general surgery who states that patient required medicine admission and he will also see the patient consultation.  Patient will also require  consult by urology.  Will consult hospitalist team        Final Clinical Impression(s) / ED Diagnoses Final diagnoses:  None    Rx / DC Orders ED Discharge Orders     None         Lorre Nick, MD 08/27/22 1000

## 2022-08-27 NOTE — H&P (Signed)
History and Physical    Patient: April Weber ZOX:[id number] DOB: August 10, 1945 DOA: 08/27/2022 DOS: the patient was seen and examined on 08/27/2022 PCP: Raliegh Ip, DO  Patient coming from: Home  Chief Complaint:  Chief Complaint  Patient presents with   Abdominal Pain   HPI: OLEAN SANGSTER is a 77 y.o. female with medical history significant of osteoarthritis, right breast cancer, stage IIIa CKD,, single kidney, history of DVT, dyspnea, hyperlipidemia, hypertension, osteopenia, peripheral vascular disease, seasonal allergies, hypothyroidism who presented to the emergency department complaints of decreased appetite, nausea, lower abdominal pain, constipation and low-grade fever for the past few days.  She has been having stool in her urine and stool like vaginal discharge since earlier this month.   No melena or hematochezia.  No flank pain, dysuria, frequency or hematuria.  She rhinorrhea, sore throat, wheezing or hemoptysis.  No chest pain, palpitations, diaphoresis, PND, orthopnea or pitting edema of the lower extremities. No polyuria, polydipsia, polyphagia or blurred vision.   ED course: Initial vital signs were temperature 98.4 F, pulse 95, respirations 16, BP 142/73 mmHg O2 sat 100% on room air.  Patient received morphine 6 mg IVP, ondansetron 4 mg IVP, hydromorphone 0.5 mg IVP, LR 1000 mL bolus and started on ceftriaxone/metronidazole IVPB while in the emergency department.  Lab work: Urinalysis was turbid with moderate hemoglobin and large leukocyte esterase.  There was proteinuria 30 mg/dL.  More than 50 RBC, more than 50 WBC, positive WBC clumps and many bacteria on microscopic examination.  Lactic acid x 2 was normal.  CBC showed a white count 5.4, hemoglobin 7.2 g/dL platelets 811.  BMP showed a sodium 127, potassium 3.9, chloride 97 and CO2 22 mmol/L.  Renal function was normal.  Glucose 135 and calcium 8.1 mg/dL.  Will recheck calcium with albumin level in AM.  Imaging:  CT abdomen/pelvis without contrast done 4 days ago show increased large volume of gas within the bladder.  There is concern for colovesical fistula.  CT cystogram suggested.  Similar to slightly decreased inflammatory changes in the sigmoid colon.  Decreased size and wall thickening about the large diverticulum in the proximal transverse colon.  Slight decrease in pelviectasis and dilation of the left ureter.  Aortic atherosclerosis.   Review of Systems: As mentioned in the history of present illness. All other systems reviewed and are negative. Past Medical History:  Diagnosis Date   Arthritis    Breast cancer 1980   RIGHT   Chronic kidney disease    has one kidney   DVT (deep venous thrombosis)    on RIGHT leg-hx of   Dyspnea    Hyperlipidemia    on meds   Hypertension    on meds   Osteopenia    Peripheral vascular disease    PONV (postoperative nausea and vomiting)    Seasonal allergies    Thyroid disease    on meds   Past Surgical History:  Procedure Laterality Date   ABDOMINAL HYSTERECTOMY     APPENDECTOMY     BREAST BIOPSY     BREAST LUMPECTOMY WITH RADIOACTIVE SEED LOCALIZATION Left 12/01/2021   Procedure: LEFT BREAST RADIOACTIVE SEED GUIDED LUMPECTOMY;  Surgeon: Abigail Miyamoto, MD;  Location: [facility];  Service: General;  Laterality: Left;  LMA   COLONOSCOPY  2011   Dr.[email or web address]-normal   DEBULKING N/A 03/30/2022   Procedure: TUMOR DEBULKING;  Surgeon: Carver Fila, MD;  Location: [facility];  Service: Gynecology;  Laterality: N/A;  EYE SURGERY     77 years old   IR IMAGING GUIDED PORT INSERTION  03/19/2022   LAPAROSCOPY N/A 03/30/2022   Procedure: LAPAROSCOPY DIAGNOSTIC;  Surgeon: Carver Fila, MD;  Location: [facility];  Service: Gynecology;  Laterality: N/A;   MASTECTOMY Right 1984   ROTATOR CUFF REPAIR     SALPINGOOPHORECTOMY Bilateral 03/30/2022   Procedure: OPEN SALPINGO OOPHORECTOMY;  Surgeon: Carver Fila, MD;  Location: [facility];   Service: Gynecology;  Laterality: Bilateral;   THYROID SURGERY Bilateral 1967   VARICOSE VEIN SURGERY     ablation   WISDOM TOOTH EXTRACTION     Social History:  reports that she quit smoking about 55 years ago. Her smoking use included cigarettes. She smoked an average of .25 packs per day. She has never used smokeless tobacco. She reports that she does not drink alcohol and does not use drugs.  Allergies  Allergen Reactions   Paclitaxel Anaphylaxis   Emend [Fosaprepitant Dimeglumine] Other (See Comments)    3 minutes into emend infusion patient began having facial flushing and redness to bilateral hands and neck, and light headedness   Codeine Nausea And Vomiting   Nsaids Other (See Comments)    Told to avoid due to kidney function   Penicillins Hives   Vibra-Tab [Doxycycline] Hives   Iodine Rash   Latex Rash    Occasionally gets a localized rash on contact with certain latex products    Family History  Problem Relation Age of Onset   Heart attack Mother    Alcohol abuse Mother    Lung cancer Mother        d. mid 30s   Breast cancer Maternal Aunt 54   Colon polyps Neg Hx    Colon cancer Neg Hx    Esophageal cancer Neg Hx    Stomach cancer Neg Hx    Rectal cancer Neg Hx     Prior to Admission medications   Medication Sig Start Date End Date Taking? Authorizing Provider  acetaminophen (TYLENOL) 650 MG CR tablet Take 1,300 mg by mouth as needed for pain.    [provider]  aspirin EC 81 MG tablet Take 81 mg by mouth at bedtime. Swallow whole.    [provider]  atenolol (TENORMIN) 25 MG tablet Take 0.5 tablets (12.5 mg total) by mouth 2 (two) times daily. 06/01/22   Raliegh Ip, DO  Calcium Carb-Cholecalciferol (CALCIUM + VITAMIN D3 PO) Take 1 tablet by mouth 2 (two) times daily.    [provider]  EPINEPHrine 0.3 mg/0.3 mL IJ SOAJ injection Inject 0.3 mg into the muscle as needed for anaphylaxis. 05/24/22   Daphine Deutscher Mary-Margaret, FNP   HYDROcodone-acetaminophen (NORCO) 5-325 MG tablet Take 1 tablet by mouth every 6 (six) hours as needed for moderate pain. 08/24/22   Sloan Leiter, DO  HYDROcodone-acetaminophen (NORCO/VICODIN) 5-325 MG tablet Take 1 tablet by mouth every 6 (six) hours as needed. 08/14/22   Arby Barrette, MD  levothyroxine (SYNTHROID) 100 MCG tablet Take 1 tablet (100 mcg total) by mouth in the morning. 06/01/22   Raliegh Ip, DO  loratadine (CLARITIN) 10 MG tablet Take 10 mg by mouth daily as needed for allergies.    [provider]  Multiple Vitamin (MULTIVITAMIN) tablet Take 1 tablet by mouth in the morning.    [provider]  olaparib (LYNPARZA) 100 MG tablet Take 1 tablet (100 mg total) by mouth 2 (two) times daily. Swallow whole. May take with food  to decrease nausea and vomiting. 08/10/22   [name], MD  promethazine (PHENERGAN) 12.5 MG tablet Take 12.5 mg by mouth every 6 (six) hours as needed for nausea or vomiting. Patient not sure of dose    [provider]  sulfamethoxazole-trimethoprim (BACTRIM) 400-80 MG tablet Take 1 tablet by mouth 2 (two) times daily. 08/26/22   [name], MD    Physical Exam: Vitals:   08/27/22 0726 08/27/22 0742 08/27/22 0743  BP: (!) 142/73 (!) 150/87   Pulse: 95 88   Resp: 16    Temp: 98.4 F (36.9 C) 99 F (37.2 C)   TempSrc: Oral Oral   SpO2: 100% 100%   Weight:   71.7 kg  Height:    (1.676 m)   Physical Exam Vitals and nursing note reviewed.  Constitutional:      General: She is awake. She is not in acute distress.    Appearance: She is well-developed. She is ill-appearing.  HENT:     Head: Normocephalic.     Nose: No rhinorrhea.     Mouth/Throat:     Mouth: Mucous membranes are moist.  Eyes:     General: No scleral icterus.    Pupils: Pupils are equal, round, and reactive to light.  Neck:     Vascular: No JVD.  Cardiovascular:     Rate and Rhythm: Normal rate and regular rhythm.     Heart sounds: S1 normal  and S2 normal.  Pulmonary:     Effort: Pulmonary effort is normal.     Breath sounds: Normal breath sounds. No wheezing, rhonchi or rales.  Abdominal:     General: Bowel sounds are normal.     Palpations: Abdomen is soft.     Tenderness: There is abdominal tenderness in the left lower quadrant. There is no right CVA tenderness, left CVA tenderness, guarding or rebound.  Musculoskeletal:     Cervical back: Neck supple.     Right lower leg: No edema.     Left lower leg: No edema.  Skin:    General: Skin is warm and dry.  Neurological:     General: No focal deficit present.     Mental Status: She is alert and oriented to person, place, and time.  Psychiatric:        Mood and Affect: Mood normal.        Behavior: Behavior normal. Behavior is cooperative.   Data Reviewed:  Results are pending, will review when available.  Assessment and Plan: Principal Problem:   Colovesical fistula MedSurg/inpatient. Keep n.p.o. for now. Continue IV fluids. Continue ceftriaxone 1 g IVPB daily. Continue metronidazole 500 mg IVPB every 12 hours. May have diet after CT imaging. Analgesics as needed. Antiemetics as needed. General surgery and urology consults appreciated.  Active Problems:   Hyponatremia Given colovesical fistula, Will not check urine sodium studies. Continue LR at 75 mL/h. Free water restriction. Follow-up level in AM.    Stage 3a chronic kidney disease Monitor renal function electrolytes.    Hyperlipidemia Currently n.p.o. for CT scan. Resume statin once imaging done.    HTN (hypertension) Resume atenolol after CT performed.    Hypothyroidism On levothyroxine. Will resume after CT scan is done.    Right ovarian epithelial cancer Follow-up with Dr. Bertis Ruddy as scheduled.     Advance Care Planning:   Code Status: Full Code   Consults: General Surgery (Dr. Chevis Pretty). Urology (Dr. Jerilee Field).  Family Communication:   Severity of Illness: The  appropriate patient status for this patient is INPATIENT. Inpatient status is judged to be reasonable and necessary in order to provide the required intensity of service to ensure the patient's safety. The patient's presenting symptoms, physical exam findings, and initial radiographic and laboratory data in the context of their chronic comorbidities is felt to place them at high risk for further clinical deterioration. Furthermore, it is not anticipated that the patient will be medically stable for discharge from the hospital within 2 midnights of admission.   * I certify that at the point of admission it is my clinical judgment that the patient will require inpatient hospital care spanning beyond 2 midnights from the point of admission due to high intensity of service, high risk for further deterioration and high frequency of surveillance required.*  Author: Bobette Mo, MD 08/27/2022 10:16 AM  For on call review [email or web address].   This document was prepared using Dragon voice recognition software and may contain some unintended transcription errors.'

## 2022-08-27 NOTE — Consult Note (Signed)
Urology Consult  Referring physician: Dr. [name]  Reason for referral: Possible colovesical fistula  History of Present Illness: 77 y/o F with history of HTN, hypothyroidism, CKD, anemia, and right ovarian cancer s/p Diagnostic laparoscopy, right salpingo-oophorectomy, peritoneal biopsies, infra-colic omentectomy by Dr. Pricilla Holm on 03/30/22 followed by Dr. Bertis Ruddy s/p chemo (getting started on olaparib) who presents to the ED with cc vaginal discharge and pneumaturia.  She also noted particulate matter in her urine.  She is voiding with a good stream and feels like she gets her bladder emptied.  CT scan of the abdomen and pelvis was obtained 08/23/2022 which revealed Increased large volume gas within the bladder. The inflamed sigmoid colon abuts the superior left aspect of the bladder, suggestive of a colovesical fistula.  Past Medical History:  Diagnosis Date   Arthritis    Breast cancer 1980   RIGHT   Chronic kidney disease    has one kidney   DVT (deep venous thrombosis)    on RIGHT leg-hx of   Dyspnea    Hyperlipidemia    on meds   Hypertension    on meds   Osteopenia    Peripheral vascular disease    PONV (postoperative nausea and vomiting)    Seasonal allergies    Thyroid disease    on meds   Past Surgical History:  Procedure Laterality Date   ABDOMINAL HYSTERECTOMY     APPENDECTOMY     BREAST BIOPSY     BREAST LUMPECTOMY WITH RADIOACTIVE SEED LOCALIZATION Left 12/01/2021   Procedure: LEFT BREAST RADIOACTIVE SEED GUIDED LUMPECTOMY;  Surgeon: Abigail Miyamoto, MD;  Location: [facility];  Service: General;  Laterality: Left;  LMA   COLONOSCOPY  2011   Dr.Kaplan-normal   DEBULKING N/A 03/30/2022   Procedure: TUMOR DEBULKING;  Surgeon: Carver Fila, MD;  Location: [facility];  Service: Gynecology;  Laterality: N/A;   EYE SURGERY     77 years old   IR IMAGING GUIDED PORT INSERTION  03/19/2022   LAPAROSCOPY N/A 03/30/2022   Procedure: LAPAROSCOPY  DIAGNOSTIC;  Surgeon: Carver Fila, MD;  Location: [facility];  Service: Gynecology;  Laterality: N/A;   MASTECTOMY Right 1984   ROTATOR CUFF REPAIR     SALPINGOOPHORECTOMY Bilateral 03/30/2022   Procedure: OPEN SALPINGO OOPHORECTOMY;  Surgeon: Carver Fila, MD;  Location: [facility];  Service: Gynecology;  Laterality: Bilateral;   THYROID SURGERY Bilateral 1967   VARICOSE VEIN SURGERY     ablation   WISDOM TOOTH EXTRACTION      Medications: I have reviewed the patient's current medications. Allergies:  Allergies  Allergen Reactions   Paclitaxel Anaphylaxis   Emend [Fosaprepitant Dimeglumine] Other (See Comments)    3 minutes into emend infusion patient began having facial flushing and redness to bilateral hands and neck, and light headedness   Codeine Nausea And Vomiting   Nsaids Other (See Comments)    Told to avoid due to kidney function   Penicillins Hives   Vibra-Tab [Doxycycline] Hives   Iodine Rash   Latex Rash    Occasionally gets a localized rash on contact with certain latex products    Family History  Problem Relation Age of Onset   Heart attack Mother    Alcohol abuse Mother    Lung cancer Mother        d. mid 36s   Breast cancer Maternal Aunt 7   Colon polyps Neg Hx    Colon cancer Neg Hx    Esophageal  cancer Neg Hx    Stomach cancer Neg Hx    Rectal cancer Neg Hx    Social History:  reports that she quit smoking about 55 years ago. Her smoking use included cigarettes. She smoked an average of .25 packs per day. She has never used smokeless tobacco. She reports that she does not drink alcohol and does not use drugs.  ROS: All systems are reviewed and negative except as noted.   Physical Exam:  Vital signs in last 24 hours: Temp:  [98.1 F (36.7 C)-99 F (37.2 C)] 98.1 F (36.7 C) (04/19 1315) Pulse Rate:  [87-95] 87 (04/19 1315) Resp:  [16-17] 17 (04/19 1315) BP: (142-159)/(73-87) 159/76 (04/19 1315) SpO2:  [100 %] 100 % (04/19 1315) Weight:   [71.7 kg] 71.7 kg (04/19 0743)  Cardiovascular: Skin warm; not flushed Respiratory: Breaths quiet; no shortness of breath Abdomen: No masses, mild distended but soft  Neurological: Normal sensation to touch Musculoskeletal: Normal motor function arms and legs Lymphatics: No inguinal adenopathy Skin: No rashes   Laboratory Data:  Results for orders placed or performed during the hospital encounter of 08/27/22 (from the past 72 hour(s))  CBC with Differential/Platelet     Status: Abnormal   Collection Time: 08/27/22  8:23 AM  Result Value Ref Range   WBC 5.4 4.0 - 10.5 K/uL   RBC 2.20 (L) 3.87 - 5.11 MIL/uL   Hemoglobin 7.2 (L) 12.0 - 15.0 g/dL   HCT 84.6 (L) 96.2 - 95.2 %   MCV 99.1 80.0 - 100.0 fL   MCH 32.7 26.0 - 34.0 pg   MCHC 33.0 30.0 - 36.0 g/dL   RDW 84.1 (H) 32.4 - 40.1 %   Platelets 133 (L) 150 - 400 K/uL   nRBC 0.0 0.0 - 0.2 %   Neutrophils Relative % 82 %   Neutro Abs 4.4 1.7 - 7.7 K/uL   Lymphocytes Relative 6 %   Lymphs Abs 0.3 (L) 0.7 - 4.0 K/uL   Monocytes Relative 11 %   Monocytes Absolute 0.6 0.1 - 1.0 K/uL   Eosinophils Relative 0 %   Eosinophils Absolute 0.0 0.0 - 0.5 K/uL   Basophils Relative 0 %   Basophils Absolute 0.0 0.0 - 0.1 K/uL   Immature Granulocytes 1 %   Abs Immature Granulocytes 0.03 0.00 - 0.07 K/uL    Comment: Performed at [facility], [address] [place], [place] [address]  Basic metabolic panel     Status: Abnormal   Collection Time: 08/27/22  8:23 AM  Result Value Ref Range   Sodium 127 (L) 135 - 145 mmol/L   Potassium 3.9 3.5 - 5.1 mmol/L   Chloride 97 (L) 98 - 111 mmol/L   CO2 22 22 - 32 mmol/L   Glucose, Bld 135 (H) 70 - 99 mg/dL    Comment: Glucose reference range applies only to samples taken after fasting for at least 8 hours.   BUN 14 8 - 23 mg/dL   Creatinine, Ser 3.66 0.44 - 1.00 mg/dL   Calcium 8.1 (L) 8.9 - 10.3 mg/dL   GFR, Estimated >44 >03 mL/min    Comment: (NOTE) Calculated using the  CKD-EPI Creatinine Equation (2021)    Anion gap 8 5 - 15    Comment: Performed at [facility], [address], [place], [place] [address]  Lactic acid, plasma     Status: None   Collection Time: 08/27/22 10:20 AM  Result Value Ref Range   Lactic  Acid, Venous 0.6 0.5 - 1.9 mmol/L    Comment: Performed at [facility], [address] [place], [place] [address]  Lactic acid, plasma     Status: None   Collection Time: 08/27/22  2:30 PM  Result Value Ref Range   Lactic Acid, Venous 0.8 0.5 - 1.9 mmol/L    Comment: Performed at [facility], [address], [place], [place] [address]   Recent Results (from the past 240 hour(s))  Urine Culture     Status: Abnormal   Collection Time: 08/23/22 10:35 PM   Specimen: Urine, Clean Catch  Result Value Ref Range Status   Specimen Description   Final    URINE, CLEAN CATCH Performed at [facility], [address] [place], [place] [address]    Special Requests   Final    NONE Performed at [facility], [address], [place], [place] [address]    Culture (A)  Final    <10,000 COLONIES/mL INSIGNIFICANT GROWTH Performed at [facility], [address] [place], [place] [address]    Report Status 08/25/2022 FINAL  Final   Creatinine: Recent Labs    08/23/22 2013 08/27/22 0823  CREATININE 0.90 0.95    Xrays: See report/chart CT images reviewed   Impression/Assessment/plan:  Pneumaturia, particulate in urine, hair and bladder on CT-agree with repeat imaging.  Discussed with the patient and her daughter the nature risk benefits and alternatives to a Foley catheter.  Certainly if she developed more bothersome passage of particulate matter or diarrhea Foley catheter might be beneficial, but can also be nidus for infection and she would be hindered by a bag. We will not place a Foley now.

## 2022-08-27 NOTE — Progress Notes (Signed)
CURSTIN SCHMALE   DOB:1946/01/15   ZO#:[id number]    ASSESSMENT & PLAN:  History of ovarian cancer She has no evidence of disease We will not start her on olaparib in the setting of ongoing fistula discharge and recurrent infection Continue supportive care  Colovesical fistula Colovaginal fistula Will consult general surgery and urology for management She was started on antibiotics  Dehydration Due to GI loss She is receiving IV fluids  Anemia chronic illness She is not symptomatic We will order repeat CBC and type and screen tomorrow I recommend blood transfusion tomorrow if continues to trend down  Severe abdominal pain We have a long discussion about the role of scheduled pain medicine versus intermittent pain medicine as needed Ultimately, she is in agreement for scheduled low-dose pain medicine along with as needed as needed  Goals of care Improvement of pain control and definitive treatment for fistula  Discharge planning Unknown I will check on her next week  All questions were answered. The patient knows to call the clinic with any problems, questions or concerns.   The total time spent in the appointment was 60 minutes encounter with patients including review of chart and various tests results, discussions about plan of care and coordination of care plan  [name], MD 08/27/2022 1:41 PM  Subjective:  Patient is well-known to me I just saw her yesterday in the outpatient clinic The patient presented to the emergency department due to severe abdominal pain She is being admitted for further management, failed outpatient therapy We have extensive discussion about pain management and whether she should receive blood transfusion support  Objective:  Vitals:   08/27/22 0742 08/27/22 1315  BP: (!) 150/87 (!) 159/76  Pulse: 88 87  Resp:  17  Temp: 99 F (37.2 C) 98.1 F (36.7 C)  SpO2: 100% 100%     Intake/Output Summary (Last 24 hours) at 08/27/2022  1341 Last data filed at 08/27/2022 1051 Gross per 24 hour  Intake 999 ml  Output --  Net 999 ml    GENERAL:alert, no distress and comfortable  NEURO: alert & oriented x 3 with fluent speech, no focal motor/sensory deficits   Labs:  Recent Labs    06/01/22 0848 06/07/22 0824 08/10/22 0857 08/14/22 1929 08/23/22 2013 08/27/22 0823  NA 134   < > 137 133* 129* 127*  K 4.2   < > 4.2 4.2 4.2 3.9  CL 97   < > 103 100 98 97*  CO2 22   < > 25 23 21* 22  GLUCOSE 93   < > 205* 123* 98 135*  BUN 7*   < > 24* 32* 16 14  CREATININE 0.90   < > 0.93 0.79 0.90 0.95  CALCIUM 8.8   < > 9.5 8.8* 8.8* 8.1*  GFRNONAA  --    < > >60 >60 >60 >60  PROT 6.0   < > 7.1 6.2* 5.7*  --   ALBUMIN 3.6*   < > 3.7 3.7 3.5  --   AST 21   < > 12* 12* 13*  --   ALT 19   < > --   ALKPHOS 101   < > 58 50 37*  --   BILITOT 0.2   < > 0.3 0.7 0.4  --   BILIDIR 0.12  --   --   --   --   --    < > = values in this interval not displayed.  Studies:  CT ABDOMEN PELVIS WO CONTRAST  Result Date: 08/23/2022 CLINICAL DATA:  Abdominal pain, acute, non localized, colovaginal fistula on prior imaging with worsening abdominal pain. Urinary retention EXAM: CT ABDOMEN AND PELVIS WITHOUT CONTRAST TECHNIQUE: Multidetector CT imaging of the abdomen and pelvis was performed following the standard protocol without IV contrast. RADIATION DOSE REDUCTION: This exam was performed according to the departmental dose-optimization program which includes automated exposure control, adjustment of the mA and/or kV according to patient size and/or use of iterative reconstruction technique. COMPARISON:  CT abdomen and pelvis 08/14/2022 FINDINGS: Lower chest: No acute abnormality. Hepatobiliary: No suspicious focal hepatic lesion. Mild distention of the gallbladder. Unchanged dilation of the common bile duct measuring up to 14 mm. No radiopaque stones or evidence of cholecystitis. Pancreas: Unremarkable. Spleen: Unremarkable.  Adrenals/Urinary Tract: Normal adrenal glands. Severe atrophy of the left kidney. Normal appearance of the right kidney. No urinary calculi. Slight decrease in pelviectasis and dilation of the left ureter. Increased large volume gas within the bladder. The inflamed sigmoid colon abuts the superior left aspect of the bladder. Given the increased large amount of gas a colovesical fistula is difficult to exclude. Stomach/Bowel: Diverticulosis and wall thickening of the sigmoid colon with adjacent inflammatory change is similar to slightly decreased from 08/14/2022. Small amount of pneumatosis in the sigmoid colon near the area of the colovaginal fistula. The previous collection of gas involving the wall of the sigmoid colon has decreased. There is a small amount of residual gas in the area of the left vaginal cuff (series 2/image 67). There is again a soft tissue tract extending from the vaginal cuff to the sigmoid colon (circa series 5/image 39) though no gas is seen within this tract. Normal caliber large and small bowel. Moderate colonic stool load mixed with contrast. Decreased size and wall thickening about the large diverticulum in the proximal transverse colon (series 5/image 52). Redemonstrated large duodenal diverticulum. Vascular/Lymphatic: Aortic atherosclerosis. No enlarged abdominal or pelvic lymph nodes. Reproductive: Hysterectomy.  No adnexal mass. Other: Small volume free fluid in the pelvis. No free intraperitoneal air. Musculoskeletal: No acute osseous abnormality. IMPRESSION: 1. Increased large volume gas within the bladder. This raises concern for colovesical fistula. Consider CT cystogram for further evaluation. 2. Similar to slightly decreased inflammatory changes surrounding the sigmoid colon. The previous collection of gas involving the wall of the sigmoid colon has decreased. There is a small amount of residual gas in the area of the left vaginal cuff. There is again a soft tissue tract  extending from the vaginal cuff to the sigmoid colon, though no gas is seen within this tract. 3. Decreased size and wall thickening about the large diverticulum in the proximal transverse colon. 4. Slight decrease in pelviectasis and dilation of the left ureter. Aortic Atherosclerosis (ICD10-I70.0). Electronically Signed   By: Minerva Fester M.D.   On: 08/23/2022 21:39   CT ABDOMEN PELVIS WO CONTRAST  Result Date: 08/14/2022 CLINICAL DATA:  Pelvic pain; history of ovarian cancer EXAM: CT ABDOMEN AND PELVIS WITHOUT CONTRAST TECHNIQUE: Multidetector CT imaging of the abdomen and pelvis was performed following the standard protocol without IV contrast. RADIATION DOSE REDUCTION: This exam was performed according to the departmental dose-optimization program which includes automated exposure control, adjustment of the mA and/or kV according to patient size and/or use of iterative reconstruction technique. COMPARISON:  Multiple priors, most recent CT abdomen and pelvis dated June 13, 2022 FINDINGS: Lower chest: No acute abnormality. Hepatobiliary: Stable small low-attenuation lesion of the right  lobe of the liver measuring 7 mm on series 2, image 14, likely a simple cysts. Gallbladder is unremarkable. Biliary ductal dilation. Pancreas: Unremarkable. No pancreatic ductal dilatation or surrounding inflammatory changes. Spleen: Normal in size without focal abnormality. Adrenals/Urinary Tract: Bilateral adrenal glands are unremarkable. Atrophic left kidney. New pelviectasis and mild dilation of the left ureter. Low-attenuation lesions of the left kidney, largest are compatible with simple cysts, others are too small to accurately characterize, no specific follow-up imaging needed. Compensatory hypertrophy of the right kidney. No hydronephrosis. Wall thickening of the left lateral wall of the urinary bladder with air seen within the urinary bladder. Stomach/Bowel: Contrast material is seen throughout the small and  large bowel. Wall thickening of the sigmoid colon with adjacent inflammatory change, decreased when compared with prior exam. New focal collection of gas involving the wall of the sigmoid colon which extends inferiorly to the area of the left vaginal cuff with contrast material seen within the vaginal cuff. Increased wall thickening of a large colonic diverticulum of the proximal transverse colon, best seen on series 5, image 61. Large duodenal diverticulum. No evidence of obstruction. Vascular/Lymphatic: Aortic atherosclerosis. No enlarged abdominal or pelvic lymph nodes. Reproductive: No adnexal masses. Other: Postsurgical changes of the anterior abdomen. Musculoskeletal: No acute or significant osseous findings. IMPRESSION: 1. Wall thickening of the sigmoid colon with adjacent inflammatory change, overall improved when compared with prior exam. New focal collection of gas involving the wall of the sigmoid colon which extends inferiorly to the area of the left vaginal cuff with contrast material seen within the vaginal cuff. 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 2. Wall thickening of the left lateral wall of the urinary bladder with air seen within the urinary bladder, findings raise concern for additional area of fistulization to the bladder. CT cystogram could be performed for better evaluation. 3. New pelviectasis and mild dilation of the left ureter, likely secondary to inflammatory change at the area of the left ureterovesicular junction. Chronic atrophy of the left kidney. 4. Increased wall thickening of a large colonic diverticulum of the proximal transverse colon, likely due to diverticulitis. 5. Aortic Atherosclerosis (ICD10-I70.0). Electronically Signed   By: Allegra Lai M.D.   On: 08/14/2022 17:06

## 2022-08-27 NOTE — ED Triage Notes (Signed)
Patient brought in from home with c/o ABD pain since the beginning of April, low grade fever,  currently passing urine and stool through vagina, urge to urinate constantly with burning with urination. States she has surgery scheduled 4/30.

## 2022-08-27 NOTE — Consult Note (Signed)
April Weber 07-03-1945  [id number].    Requesting MD: Freida Busman, MD  Chief Complaint/Reason for Consult: Colovesical Fistula, Colovaginal fistula   HPI:  77 y/o F with history of HTN, hypothyroidism, CKD, anemia, and right ovarian cancer s/p Diagnostic laparoscopy, right salpingo-oophorectomy, peritoneal biopsies, infra-colic omentectomy by Dr. Pricilla Holm on 03/30/22 followed by Dr. Bertis Ruddy s/p chemo (getting started on olaparib) who presents to the ED with cc vaginal discharge and pneumaturia. Her symptoms have been present since the beginning of April.  Reports progressive lower abdominal pain, pneumaturia, flecks of stool in her urine, stool like vaginal discharge, decreased appetite and constipation.  She reports she has not had a bowel movement in several days but is still passing flatus.  Low-grade fevers at home over the last several days.  She has had CT scan on 4/6 and 4/15 for above.  She did complete a 7-day course of Cipro/Flagyl earlier this month but was not on anything prior to presentation.  She was referred to urology by her oncologist. She has an appointment in our office April 30 with Dr. Maisie Fus but due to her discomfort could not wait and came to ED.  Her last colonoscopy was 12/2020 and showed internal hemorrhoids, a polyp (tubular adenoma) of hepatic flexure, and diverticulosis of the entire colon, most severe in the sigmoid colon.   Cancer treatment as below:  03/19/22 CT Bx omental mass, port-a-cath placement 03/30/22  Diagnostic laparoscopy, right salpingo-oophorectomy, peritoneal biopsies, infra-colic omentectomy  04/26/22 - 08/10/22 chemotherapy   Denies use of blood thinners, denies tobacco alcohol or drug use. She is a former smoker who quit in 1969.  ROS: As above, see hpi  Family History  Problem Relation Age of Onset   Heart attack Mother    Alcohol abuse Mother    Lung cancer Mother        d. mid 63s   Breast cancer Maternal Aunt 55   Colon polyps Neg Hx     Colon cancer Neg Hx    Esophageal cancer Neg Hx    Stomach cancer Neg Hx    Rectal cancer Neg Hx     Past Medical History:  Diagnosis Date   Arthritis    Breast cancer 1980   RIGHT   Chronic kidney disease    has one kidney   DVT (deep venous thrombosis)    on RIGHT leg-hx of   Dyspnea    Hyperlipidemia    on meds   Hypertension    on meds   Osteopenia    Peripheral vascular disease    PONV (postoperative nausea and vomiting)    Seasonal allergies    Thyroid disease    on meds    Past Surgical History:  Procedure Laterality Date   ABDOMINAL HYSTERECTOMY     APPENDECTOMY     BREAST BIOPSY     BREAST LUMPECTOMY WITH RADIOACTIVE SEED LOCALIZATION Left 12/01/2021   Procedure: LEFT BREAST RADIOACTIVE SEED GUIDED LUMPECTOMY;  Surgeon: Abigail Miyamoto, MD;  Location: [facility];  Service: General;  Laterality: Left;  LMA   COLONOSCOPY  2011   [email or web address]   DEBULKING N/A 03/30/2022   Procedure: TUMOR DEBULKING;  Surgeon: Carver Fila, MD;  Location: [facility];  Service: Gynecology;  Laterality: N/A;   EYE SURGERY     77 years old   IR IMAGING GUIDED PORT INSERTION  03/19/2022   LAPAROSCOPY N/A 03/30/2022   Procedure: LAPAROSCOPY DIAGNOSTIC;  Surgeon: Carver Fila, MD;  Location: [facility];  Service: Gynecology;  Laterality: N/A;   MASTECTOMY Right 1984   ROTATOR CUFF REPAIR     SALPINGOOPHORECTOMY Bilateral 03/30/2022   Procedure: OPEN SALPINGO OOPHORECTOMY;  Surgeon: Carver Fila, MD;  Location: [facility];  Service: Gynecology;  Laterality: Bilateral;   THYROID SURGERY Bilateral 1967   VARICOSE VEIN SURGERY     ablation   WISDOM TOOTH EXTRACTION      Social History:  reports that she quit smoking about 55 years ago. Her smoking use included cigarettes. She smoked an average of .25 packs per day. She has never used smokeless tobacco. She reports that she does not drink alcohol and does not use drugs.  Allergies:  Allergies   Allergen Reactions   Paclitaxel Anaphylaxis   Emend [Fosaprepitant Dimeglumine] Other (See Comments)    3 minutes into emend infusion patient began having facial flushing and redness to bilateral hands and neck, and light headedness   Codeine Nausea And Vomiting   Nsaids Other (See Comments)    Told to avoid due to kidney function   Penicillins Hives   Vibra-Tab [Doxycycline] Hives   Iodine Rash   Latex Rash    Occasionally gets a localized rash on contact with certain latex products    Medications Prior to Admission  Medication Sig Dispense Refill   acetaminophen (TYLENOL) 650 MG CR tablet Take 1,300 mg by mouth as needed for pain.     aspirin EC 81 MG tablet Take 81 mg by mouth at bedtime. Swallow whole.     atenolol (TENORMIN) 25 MG tablet Take 0.5 tablets (12.5 mg total) by mouth 2 (two) times daily. 90 tablet 3   Calcium Carb-Cholecalciferol (CALCIUM + VITAMIN D3 PO) Take 1 tablet by mouth 2 (two) times daily.     EPINEPHrine 0.3 mg/0.3 mL IJ SOAJ injection Inject 0.3 mg into the muscle as needed for anaphylaxis. 2 each 2   HYDROcodone-acetaminophen (NORCO) 5-325 MG tablet Take 1 tablet by mouth every 6 (six) hours as needed for moderate pain. (Patient taking differently: Take 1 tablet by mouth every 4 (four) hours as needed for moderate pain.) 15 tablet 0   levothyroxine (SYNTHROID) 100 MCG tablet Take 1 tablet (100 mcg total) by mouth in the morning. 90 tablet 3   Multiple Vitamin (MULTIVITAMIN) tablet Take 1 tablet by mouth in the morning.     ondansetron (ZOFRAN) 8 MG tablet Take 8 mg by mouth as needed for nausea or vomiting.     simvastatin (ZOCOR) 20 MG tablet Take 20 mg by mouth daily.     sulfamethoxazole-trimethoprim (BACTRIM) 400-80 MG tablet Take 1 tablet by mouth 2 (two) times daily. 14 tablet 0   amLODipine (NORVASC) 5 MG tablet Take 5 mg by mouth daily. (Patient not taking: Reported on 08/27/2022)     olaparib (LYNPARZA) 100 MG tablet Take 1 tablet (100 mg total) by  mouth 2 (two) times daily. Swallow whole. May take with food to decrease nausea and vomiting. (Patient not taking: Reported on 08/27/2022) 60 tablet 11   promethazine (PHENERGAN) 12.5 MG tablet Take 12.5 mg by mouth every 6 (six) hours as needed for nausea or vomiting. Patient not sure of dose (Patient not taking: Reported on 08/27/2022)       Physical Exam: Blood pressure (!) 150/87, pulse 88, temperature 99 F (37.2 C), temperature source Oral, resp. rate 16, height 5\' 6"  (1.676 m), weight 71.7 kg, SpO2 100 %. General: pleasant, WD/WN female who is laying in bed in  NAD HEENT: head is normocephalic, atraumatic.  Heart: regular, rate, and rhythm.   Lungs: CTAB, no wheezes, rhonchi, or rales noted.  Respiratory effort nonlabored Abd:  Soft, mild distention, there is lower abdominal tenderness diffusely without rigidity or guarding. No upper abdominal tenderness. +BS. Prior abdominal scar well healed.  MS: no BUE or BLE edema Neuro: MAE's.  Normal speech.  Thought process intact  Results for orders placed or performed during the hospital encounter of 08/27/22 (from the past 48 hour(s))  CBC with Differential/Platelet     Status: Abnormal   Collection Time: 08/27/22  8:23 AM  Result Value Ref Range   WBC 5.4 4.0 - 10.5 K/uL   RBC 2.20 (L) 3.87 - 5.11 MIL/uL   Hemoglobin 7.2 (L) 12.0 - 15.0 g/dL   HCT 40.9 (L) 81.1 - 91.4 %   MCV 99.1 80.0 - 100.0 fL   MCH 32.7 26.0 - 34.0 pg   MCHC 33.0 30.0 - 36.0 g/dL   RDW 78.2 (H) 95.6 - 21.3 %   Platelets 133 (L) 150 - 400 K/uL   nRBC 0.0 0.0 - 0.2 %   Neutrophils Relative % 82 %   Neutro Abs 4.4 1.7 - 7.7 K/uL   Lymphocytes Relative 6 %   Lymphs Abs 0.3 (L) 0.7 - 4.0 K/uL   Monocytes Relative 11 %   Monocytes Absolute 0.6 0.1 - 1.0 K/uL   Eosinophils Relative 0 %   Eosinophils Absolute 0.0 0.0 - 0.5 K/uL   Basophils Relative 0 %   Basophils Absolute 0.0 0.0 - 0.1 K/uL   Immature Granulocytes 1 %   Abs Immature Granulocytes 0.03 0.00 - 0.07  K/uL    Comment: Performed at [facility], [address] [place], [place] [address]  Basic metabolic panel     Status: Abnormal   Collection Time: 08/27/22  8:23 AM  Result Value Ref Range   Sodium 127 (L) 135 - 145 mmol/L   Potassium 3.9 3.5 - 5.1 mmol/L   Chloride 97 (L) 98 - 111 mmol/L   CO2 22 22 - 32 mmol/L   Glucose, Bld 135 (H) 70 - 99 mg/dL    Comment: Glucose reference range applies only to samples taken after fasting for at least 8 hours.   BUN 14 8 - 23 mg/dL   Creatinine, Ser 8.46 0.44 - 1.00 mg/dL   Calcium 8.1 (L) 8.9 - 10.3 mg/dL   GFR, Estimated >96 >29 mL/min    Comment: (NOTE) Calculated using the CKD-EPI Creatinine Equation (2021)    Anion gap 8 5 - 15    Comment: Performed at [facility], [address], [place], [place] [address]  Lactic acid, plasma     Status: None   Collection Time: 08/27/22 10:20 AM  Result Value Ref Range   Lactic Acid, Venous 0.6 0.5 - 1.9 mmol/L    Comment: Performed at [facility], [address] [place], [place] [address]   No results found.    Assessment/Plan 77 y/o F with hx of ovarian cancer who presents with concerns of colovaginal fistula and colovesical fistula - Afebrile, VSS, WBC 5.4 (though she is immunosuppressed), lactic wnl - CT abdomen and pelvis 4/15 with increased air in the bladder, stable to improving inflammatory changes around the sigmoid colon, and some gas around the left vaginal cuff. No large volume free air. No abscess. - No indication for emergency surgery at this time.  - Will repeat CT scan with  PO contrast today. Oncology also getting Urology involved to weigh in. Await recs. Likely can have liquids after CT scan but would like to wait for this to result. - We will follow with you  FEN - NPO, IVF per [facility] VTE - SCDs, okay for chemical prophylaxis from our standpoint ID - Zosyn  I reviewed nursing notes, ED provider notes, Consultant  (oncology) notes, last 24 h vitals and pain scores, last 48 h intake and output, last 24 h labs and trends, and last 24 h imaging results.  Leary Roca, [facility] 08/27/2022, 4:41 PM Please see Amion for pager number during day hours 7:00am-4:30pm or 7:00am -11:30am on weekends

## 2022-08-28 DIAGNOSIS — N321 Vesicointestinal fistula: Secondary | ICD-10-CM | POA: Diagnosis not present

## 2022-08-28 LAB — COMPREHENSIVE METABOLIC PANEL
ALT: 12 U/L (ref 0–44)
AST: 13 U/L — ABNORMAL LOW (ref 15–41)
Albumin: 2.5 g/dL — ABNORMAL LOW (ref 3.5–5.0)
Alkaline Phosphatase: 35 U/L — ABNORMAL LOW (ref 38–126)
Anion gap: 8 (ref 5–15)
BUN: 12 mg/dL (ref 8–23)
CO2: 22 mmol/L (ref 22–32)
Calcium: 8.1 mg/dL — ABNORMAL LOW (ref 8.9–10.3)
Chloride: 98 mmol/L (ref 98–111)
Creatinine, Ser: 0.97 mg/dL (ref 0.44–1.00)
GFR, Estimated: >60 mL/min (ref >60)
Glucose, Bld: 105 mg/dL — ABNORMAL HIGH (ref 70–99)
Potassium: 3.8 mmol/L (ref 3.5–5.1)
Sodium: 128 mmol/L — ABNORMAL LOW (ref 135–145)
Total Bilirubin: 0.5 mg/dL (ref 0.3–1.2)
Total Protein: 5.4 g/dL — ABNORMAL LOW (ref 6.5–8.1)

## 2022-08-28 LAB — TYPE AND SCREEN
ABO/RH(D): A POS
Antibody Screen: NEGATIVE

## 2022-08-28 LAB — CBC
HCT: 20.4 % — ABNORMAL LOW (ref 36.0–46.0)
Hemoglobin: 6.8 g/dL — CL (ref 12.0–15.0)
MCH: 33.3 pg (ref 26.0–34.0)
MCHC: 33.3 g/dL (ref 30.0–36.0)
MCV: 100 fL (ref 80.0–100.0)
Platelets: 115 10*3/uL — ABNORMAL LOW (ref 150–400)
RBC: 2.04 MIL/uL — ABNORMAL LOW (ref 3.87–5.11)
RDW: 16.7 % — ABNORMAL HIGH (ref 11.5–15.5)
WBC: 4.9 10*3/uL (ref 4.0–10.5)
nRBC: 0 % (ref 0.0–0.2)

## 2022-08-28 LAB — PREPARE RBC (CROSSMATCH)

## 2022-08-28 LAB — HEMOGLOBIN AND HEMATOCRIT, BLOOD
HCT: 19.3 % — ABNORMAL LOW (ref 36.0–46.0)
HCT: 22.8 % — ABNORMAL LOW (ref 36.0–46.0)
Hemoglobin: 6.3 g/dL — CL (ref 12.0–15.0)
Hemoglobin: 7.4 g/dL — ABNORMAL LOW (ref 12.0–15.0)

## 2022-08-28 LAB — BPAM RBC: Unit Type and Rh: 6200

## 2022-08-28 LAB — CULTURE, BLOOD (ROUTINE X 2)

## 2022-08-28 MED ORDER — ZINC OXIDE 40 % EX OINT
TOPICAL_OINTMENT | Freq: Four times a day (QID) | CUTANEOUS | Status: DC
  Administered 2022-09-04 – 2022-09-05 (×2): 1 via TOPICAL
  Filled 2022-08-28 (×2): qty 57

## 2022-08-28 MED ORDER — SODIUM CHLORIDE 0.9% IV SOLUTION: Freq: Once | INTRAVENOUS | Status: AC

## 2022-08-28 NOTE — Progress Notes (Signed)
Urology Progress Note    Subjective: CT scan overnight with fistula between the left bladder wall and sigmoid colon with new bilateral hydroureteronephrosis down the bladder.  Patient does report issues with voiding overnight noting that she has been having increased pressure in suprapubic pain.  PureWick is in place with clear yellow urine in canister.  Tmax 38.2 early this morning.  Remains on ceftriaxone and Flagyl.  Objective: Vital signs in last 24 hours: Temp:  [98 F (36.7 C)-100.7 F (38.2 C)] 99 F (37.2 C) (04/20 0653) Pulse Rate:  [81-90] 90 (04/20 0536) Resp:  [14-18] 16 (04/20 0653) BP: (142-159)/(62-81) 156/81 (04/20 0653) SpO2:  [98 %-100 %] 100 % (04/20 0653)  Intake/Output from previous day: 04/19 0701 - 04/20 0700 In: 2754.8 [I.V.:962.8; Blood:696; IV Piggyback:1096] Out: 50 [Urine:50] Intake/Output this shift: No intake/output data recorded.  Physical Exam:  General: Alert and oriented CV: Regular rate Lungs: Normal work of breathing on room air Abdomen: Soft, mild tenderness to palpation in suprapubic area GU: Voiding spontaneously, PureWick in place with minimal clear yellow urine in canister Ext: NT, No erythema  Lab Results: Recent Labs    08/27/22 2335 08/28/22 0100 08/28/22 1200  HGB 6.3* 6.8* 7.4*  HCT 19.3* 20.4* 22.8*   BMET Recent Labs    08/27/22 0823 08/28/22 0100  NA 127* 128*  K 3.9 3.8  CL 97* 98  CO2 22 22  GLUCOSE 135* 105*  BUN 14 12  CREATININE 0.95 0.97  CALCIUM 8.1* 8.1*     Studies/Results: CT ABDOMEN PELVIS WO CONTRAST  Result Date: 08/27/2022 CLINICAL DATA:  Worsening abdominal pain with history of ovarian cancer. Suspected colovaginal and/or colovesical fistula. Low-grade fever. EXAM: CT ABDOMEN AND PELVIS WITHOUT CONTRAST TECHNIQUE: Multidetector CT imaging of the abdomen and pelvis was performed following the standard protocol without IV contrast. RADIATION DOSE REDUCTION: This exam was performed according to  the departmental dose-optimization program which includes automated exposure control, adjustment of the mA and/or kV according to patient size and/or use of iterative reconstruction technique. COMPARISON:  CT without contrast 08/23/2022, CT with oral contrast only 08/14/2022, CT without contrast 06/13/2022 FINDINGS: Lower chest: No acute abnormality. Coronary artery calcifications and minimal chronic pericardial effusion are again noted. Normal heart size. The cardiac blood pool is less dense than the myocardium, consistent with anemia. This was seen previously. Hepatobiliary: There is a 1.1 cm cyst again noted in hepatic segment 8, Hounsfield density of 7.6. No other focal abnormality of liver is visible without contrast. The gallbladder is unremarkable. Prominent common bile duct is again noted measuring 12 mm, unchanged. Pancreas: No abnormality. Spleen: No abnormality.  No splenomegaly. Adrenals/Urinary Tract: There is no adrenal mass. Severe chronic atrophy again noted left kidney with small cysts. Right renal cortex is normal in attenuation. There has developed bilateral moderate hydroureteronephrosis into the pelvis where the ureters are difficult to follow due to the pre-existing ongoing inflammatory process along the sigmoid colon. There is likely ureteral tethering due to the inflammatory process contributing to obstructive uropathy. No stone disease is seen. The bladder is distended with air and fluid. There is a moderate amount of patchy enteric contrast and settling in the dependent bladder on the left-greater-than-right and now noted a demonstrable fistulous tract from the diseased abutting sigmoid colon for the left dorsal wall of the bladder, best seen on 2:64 and 65 and on sagittal reconstruction images 79-86. There previously was no enteric contrast in the bladder. Stomach/Bowel: The stomach is contracted. A 3.5 cm  descending duodenal diverticulum is again noted, with air contrast level. No small  bowel dilatation or inflammation is seen. The appendix is not seen. There is moderate retained dense contrasted stool again in the ascending, transverse descending colon. Diverticulosis greatest in the sigmoid is seen with continued sigmoid wall thickening and inflammatory stranding. Further evaluation to exclude underlying colonic lesion is recommended when clinically feasible. Inflammatory reaction around the diseased segment has increased since April 15 and certainly since April 6 as well. Despite the ongoing inflammatory process no free air or abscess is seen in the area. No focal rectal abnormality is seen. Vascular/Lymphatic: Aortic atherosclerosis. No enlarged abdominal or pelvic lymph nodes. Reproductive: There previously was a well seen colovaginal fistula from the undersurface of the sigmoid colon into the left superior vaginal cavity, but this is not as well seen today. Contrast in the vagina is noted however, consistent with continued patency of the tract. The uterus is absent. No adnexal mass is seen. Other: Small amounts of increased pelvic reactive fluid, increased trace reactive fluid along the distal left paracolic gutter. No free air, free hemorrhage or abscess is seen. There are no incarcerated hernias. Musculoskeletal: There is dextroscoliosis, osteopenia and advanced degenerative change of the lumbar spine, with acquired spinal stenosis again noted L4-5. No destructive osseous or lytic lesions. IMPRESSION: 1. Continued sigmoid wall thickening and inflammatory reaction with diverticulosis with increased inflammation and layering reactive pelvic fluid since April 15. Further evaluation to exclude underlying colonic lesion is recommended when clinically feasible. 2. There is now a demonstrable fistulous tract from the sigmoid colon into the left dorsal bladder wall, with air and fluid in the bladder and patchy enteric contrast now settling in the dependent bladder. 3. The known left-sided  colovaginal fistula is not well seen today but there is contrast in the vagina consistent with continued tract patency. 4. There is now moderate bilateral hydroureteronephrosis into the pelvis where the ureters are difficult to follow due to the pre-existing inflammatory process, with ureteral tethering by inflammatory process most likely causing the obstructive uropathy. No stone disease is seen. 5. Severe chronic atrophy of the left kidney. 6. Aortic and coronary artery atherosclerosis. 7. Anemia. 8. Prominent common bile duct at 12 mm, unchanged. 9. Constipation. 10. Osteopenia, scoliosis, and advanced degenerative change of the lumbar spine with acquired spinal stenosis L4-5. 11. These results will be called to the ordering clinician or representative by the Radiologist Assistant, and communication documented in the PACS or [employer]. Aortic Atherosclerosis (ICD10-I70.0). Electronically Signed   By: Almira Bar M.D.   On: 08/27/2022 21:39    Assessment/Plan:  77 y.o. female admitted with pneumaturia and dysuria with colovesical fistula on recent CT imaging.  -No indications for acute urologic surgical intervention at this time. -Defer management of colovesical fistula to general surgery. -Plan for placement of Foley catheter today due to bilateral hydroureteronephrosis and incomplete bladder emptying  Please page urology with any questions or concerns   LOS: 1 day   Tomasa Dobransky Jamella Grayer 08/28/2022, 12:42 PM

## 2022-08-28 NOTE — Consult Note (Signed)
WOC Nurse Consult Note: Reason for Consult:irritant contact dermatitis secondary to colorectal and possible colovesical fistulae Wound type:irritant contact dermatitis  ICD-10 CM Codes for Irritant Dermatitis  L24A9 - Due to friction or contact with other specified body fluids L24B3 - Related to fecal or urinary stoma or fistula L30.4  - Erythema intertrigo. Also used for abrasion of the hand, chafing of the skin, dermatitis due to sweating and friction, friction dermatitis, friction eczema, and genital/thigh intertrigo.   Pressure Injury POA: N/A Dressing procedure/placement/frequency:I have provided Nursing with guidance in the care of the perineal area skin secondary to above using every 6 hours and PRN cleansing with tepid tap water via Peri bottle and gently patting dry. Desitin ointment is recommended as a topical moisture barrier.   Turning and repositioning from side to side with time in the supine position minimized is recommended. Only DermaTherapy bed linen system is to be used beneath patient, no plastic underpads or containment garments while in house.  WOC nursing team will not follow, but will remain available to this patient, the nursing and medical teams.  Please re-consult if needed.  Thank you for inviting [place] to participate in this patient's Plan of Care.  Ladona Mow, MSN, RN, CNS, GNP, Leda Min, [employer], [employer] phone:  [phone]

## 2022-08-28 NOTE — Progress Notes (Signed)
PROGRESS NOTE    April Weber  AVW:[id number] DOB: 1945/07/29 DOA: 08/27/2022 PCP: Raliegh Ip, DO   Brief Narrative:  April Weber is a 76 y.o. female with medical history significant of osteoarthritis, right breast cancer, stage IIIa CKD,, single kidney, history of DVT, dyspnea, hyperlipidemia, hypertension, osteopenia, peripheral vascular disease, seasonal allergies, hypothyroidism who presented to the emergency department complaints of decreased appetite, nausea, lower abdominal pain, constipation and low-grade fever for the past few days.    Patient has known colovesicular and colovaginal fistula discovered earlier this month after recurrent infections.  CT abdomen pelvis without contrast done just 4 days prior to admission shows increased large volume of gas within the bladder.  Of note she just recently completed chemotherapy the first week of April.  As such urology surgery and oncology have been called in consult.  Hospitalist has been called for admission.  Assessment & Plan:   Principal Problem:   Colovesical fistula Active Problems:   Hyperlipidemia   HTN (hypertension)   Hypothyroidism   Stage 3a chronic kidney disease   Hyponatremia   Right ovarian epithelial cancer   Sepsis secondary to subacute vs chronic colovesical/colovaginal fistulas, POA -Fistulas appear to be well-known to the hospital system, repeat imaging last night demonstrates increased inflammation and pelvic fluid from earlier this week. -Urology and general surgery have been consulted, they are aware of the patient.  Unclear at this point what our surgical options are if any. -Continue broad-spectrum antibiotics(ceftriaxone, Flagyl) -Initiate clears until surgical approach can be determined  Hyponatremia, hypovolemic -Continue IV fluids, urinalysis pointless given fistula/contamination   Acute on chronic anemia of chronic disease  -Baseline anemia down overnight likely secondary to  worsening infection and poor p.o. intake -Rule out acute bleed -none noted per stool or urine, CT abdomen pelvis without overt intra-abdominal bleeding -Status post transfusion, repeat hemoglobin within normal limits  Stage 3a chronic kidney disease -At baseline, follow renal function, urine output appropriate   Hyperlipidemia -Low-salt low-fat diet, resume statin once able to take p.o. safely   HTN (hypertension) - Home atenolol on hold, previously normotensive in setting of above   Hypothyroidism Resume home thyroid supplementation  Right ovarian epithelial cancer Follow-up with Dr. Bertis Ruddy as scheduled. Status post chemotherapy first week of April  DVT prophylaxis: None until acute bleed can be ruled out Code Status: Full Family Communication: Daughter at bedside  Status is: Inpatient  Dispo: The patient is from: Home              Anticipated d/c is to: To be determined              Anticipated d/c date is: To be determined -expect prolonged hospitalization              Patient currently not medically stable for discharge  Consultants:  Urology, general surgery, oncology  Procedures:  None currently planned, follow consult recommendations as above  Antimicrobials:  Ceftriaxone, Flagyl  Subjective: No acute issues or events overnight, patient continues to voice frustration about "waiting for things to be done" -we discussed that thankfully she is not requiring emergency surgery which is much more risky.  She is otherwise requesting p.o. diet advancement which was transition to clears as above.  Objective: Vitals:   08/28/22 0414 08/28/22 0415 08/28/22 0536 08/28/22 0653  BP: (!) 144/70 (!) 144/70 (!) 154/74 (!) 156/81  Pulse: 84 84 90   Resp: 18 18 16 16   Temp: 98.7 F (37.1 C) 98.7 F (  37.1 C) (!) 100.7 F (38.2 C) 99 F (37.2 C)  TempSrc: Oral     SpO2: 100%  98% 100%  Weight:      Height:        Intake/Output Summary (Last 24 hours) at 08/28/2022  0820 Last data filed at 08/28/2022 0653 Gross per 24 hour  Intake 2754.77 ml  Output 50 ml  Net 2704.77 ml   Filed Weights   08/27/22 0743  Weight: 71.7 kg    Examination:  General:  Pleasantly resting in bed, No acute distress. HEENT:  Normocephalic atraumatic.  Sclerae nonicteric, noninjected.  Extraocular movements intact bilaterally. Neck:  Without mass or deformity.  Trachea is midline. Lungs:  Clear to auscultate bilaterally without rhonchi, wheeze, or rales. Heart:  Regular rate and rhythm.  Without murmurs, rubs, or gallops. Abdomen:  Soft, nontender, nondistended.  Without guarding or rebound. GI/GU: Purulent foul discharge noted vaginally, urine at bedside canister noted to be feculent  Data Reviewed: I have personally reviewed following labs and imaging studies  CBC: Recent Labs  Lab 08/23/22 2013 08/26/22 1207 08/27/22 0823 08/27/22 2335 08/28/22 0100  WBC 5.1 6.1 5.4  --  4.9  NEUTROABS 3.7 4.9 4.4  --   --   HGB 7.3* 7.9* 7.2* 6.3* 6.8*  HCT 21.5* 23.9* 21.8* 19.3* 20.4*  MCV 96.0 98.0 99.1  --  100.0  PLT 186 180 133*  --  115*   Basic Metabolic Panel: Recent Labs  Lab 08/23/22 2013 08/27/22 0823 08/28/22 0100  NA 129* 127* 128*  K 4.2 3.9 3.8  CL 98 97* 98  CO2 21* 22 22  GLUCOSE 98 135* 105*  BUN CREATININE 0.90 0.95 0.97  CALCIUM 8.8* 8.1* 8.1*   GFR: Estimated Creatinine Clearance: 49.3 mL/min (by C-G formula based on SCr of 0.97 mg/dL). Liver Function Tests: Recent Labs  Lab 08/23/22 2013 08/28/22 0100  AST 13* 13*  ALT 10 12  ALKPHOS 37* 35*  BILITOT 0.4 0.5  PROT 5.7* 5.4*  ALBUMIN 3.5 2.5*   Recent Labs  Lab 08/23/22 2013  LIPASE 13   No results for input(s): "AMMONIA" in the last 168 hours. Coagulation Profile: No results for input(s): "INR", "PROTIME" in the last 168 hours. Cardiac Enzymes: No results for input(s): "CKTOTAL", "CKMB", "CKMBINDEX", "TROPONINI" in the last 168 hours. BNP (last 3 results) No  results for input(s): "PROBNP" in the last 8760 hours. HbA1C: No results for input(s): "HGBA1C" in the last 72 hours. CBG: No results for input(s): "GLUCAP" in the last 168 hours. Lipid Profile: No results for input(s): "CHOL", "HDL", "LDLCALC", "TRIG", "CHOLHDL", "LDLDIRECT" in the last 72 hours. Thyroid Function Tests: No results for input(s): "TSH", "T4TOTAL", "FREET4", "T3FREE", "THYROIDAB" in the last 72 hours. Anemia Panel: No results for input(s): "VITAMINB12", "FOLATE", "FERRITIN", "TIBC", "IRON", "RETICCTPCT" in the last 72 hours. Sepsis Labs: Recent Labs  Lab 08/27/22 1020 08/27/22 1430  LATICACIDVEN 0.6 0.8    Recent Results (from the past 240 hour(s))  Urine Culture     Status: Abnormal   Collection Time: 08/23/22 10:35 PM   Specimen: Urine, Clean Catch  Result Value Ref Range Status   Specimen Description   Final    URINE, CLEAN CATCH Performed at [facility], [address], [place], [place] [address]    Special Requests   Final    NONE Performed at [facility], [address], [place], [place] [address]    Culture (A)  Final    <  10,000 COLONIES/mL INSIGNIFICANT GROWTH Performed at [facility], [address] [place], [place] [address]    Report Status 08/25/2022 FINAL  Final         Radiology Studies: CT ABDOMEN PELVIS WO CONTRAST  Result Date: 08/27/2022 CLINICAL DATA:  Worsening abdominal pain with history of ovarian cancer. Suspected colovaginal and/or colovesical fistula. Low-grade fever. EXAM: CT ABDOMEN AND PELVIS WITHOUT CONTRAST TECHNIQUE: Multidetector CT imaging of the abdomen and pelvis was performed following the standard protocol without IV contrast. RADIATION DOSE REDUCTION: This exam was performed according to the departmental dose-optimization program which includes automated exposure control, adjustment of the mA and/or kV according to patient size and/or use of iterative reconstruction  technique. COMPARISON:  CT without contrast 08/23/2022, CT with oral contrast only 08/14/2022, CT without contrast 06/13/2022 FINDINGS: Lower chest: No acute abnormality. Coronary artery calcifications and minimal chronic pericardial effusion are again noted. Normal heart size. The cardiac blood pool is less dense than the myocardium, consistent with anemia. This was seen previously. Hepatobiliary: There is a 1.1 cm cyst again noted in hepatic segment 8, Hounsfield density of 7.6. No other focal abnormality of liver is visible without contrast. The gallbladder is unremarkable. Prominent common bile duct is again noted measuring 12 mm, unchanged. Pancreas: No abnormality. Spleen: No abnormality.  No splenomegaly. Adrenals/Urinary Tract: There is no adrenal mass. Severe chronic atrophy again noted left kidney with small cysts. Right renal cortex is normal in attenuation. There has developed bilateral moderate hydroureteronephrosis into the pelvis where the ureters are difficult to follow due to the pre-existing ongoing inflammatory process along the sigmoid colon. There is likely ureteral tethering due to the inflammatory process contributing to obstructive uropathy. No stone disease is seen. The bladder is distended with air and fluid. There is a moderate amount of patchy enteric contrast and settling in the dependent bladder on the left-greater-than-right and now noted a demonstrable fistulous tract from the diseased abutting sigmoid colon for the left dorsal wall of the bladder, best seen on 2:64 and 65 and on sagittal reconstruction images 79-86. There previously was no enteric contrast in the bladder. Stomach/Bowel: The stomach is contracted. A 3.5 cm descending duodenal diverticulum is again noted, with air contrast level. No small bowel dilatation or inflammation is seen. The appendix is not seen. There is moderate retained dense contrasted stool again in the ascending, transverse descending colon.  Diverticulosis greatest in the sigmoid is seen with continued sigmoid wall thickening and inflammatory stranding. Further evaluation to exclude underlying colonic lesion is recommended when clinically feasible. Inflammatory reaction around the diseased segment has increased since April 15 and certainly since April 6 as well. Despite the ongoing inflammatory process no free air or abscess is seen in the area. No focal rectal abnormality is seen. Vascular/Lymphatic: Aortic atherosclerosis. No enlarged abdominal or pelvic lymph nodes. Reproductive: There previously was a well seen colovaginal fistula from the undersurface of the sigmoid colon into the left superior vaginal cavity, but this is not as well seen today. Contrast in the vagina is noted however, consistent with continued patency of the tract. The uterus is absent. No adnexal mass is seen. Other: Small amounts of increased pelvic reactive fluid, increased trace reactive fluid along the distal left paracolic gutter. No free air, free hemorrhage or abscess is seen. There are no incarcerated hernias. Musculoskeletal: There is dextroscoliosis, osteopenia and advanced degenerative change of the lumbar spine, with acquired spinal stenosis again noted L4-5. No destructive osseous or lytic lesions. IMPRESSION: 1.  Continued sigmoid wall thickening and inflammatory reaction with diverticulosis with increased inflammation and layering reactive pelvic fluid since April 15. Further evaluation to exclude underlying colonic lesion is recommended when clinically feasible. 2. There is now a demonstrable fistulous tract from the sigmoid colon into the left dorsal bladder wall, with air and fluid in the bladder and patchy enteric contrast now settling in the dependent bladder. 3. The known left-sided colovaginal fistula is not well seen today but there is contrast in the vagina consistent with continued tract patency. 4. There is now moderate bilateral hydroureteronephrosis  into the pelvis where the ureters are difficult to follow due to the pre-existing inflammatory process, with ureteral tethering by inflammatory process most likely causing the obstructive uropathy. No stone disease is seen. 5. Severe chronic atrophy of the left kidney. 6. Aortic and coronary artery atherosclerosis. 7. Anemia. 8. Prominent common bile duct at 12 mm, unchanged. 9. Constipation. 10. Osteopenia, scoliosis, and advanced degenerative change of the lumbar spine with acquired spinal stenosis L4-5. 11. These results will be called to the ordering clinician or representative by the Radiologist Assistant, and communication documented in the PACS or [employer]. Aortic Atherosclerosis (ICD10-I70.0). Electronically Signed   By: Almira Bar M.D.   On: 08/27/2022 21:39    Scheduled Meds:  Chlorhexidine Gluconate Cloth  6 each Topical Daily   oxyCODONE-acetaminophen  1 tablet Oral Q8H   sodium chloride flush  10-40 mL Intracatheter Q12H   Continuous Infusions:  cefTRIAXone (ROCEPHIN)  IV 2 g (08/27/22 2136)   lactated ringers 75 mL/hr at 08/27/22 1856   metronidazole Stopped (08/27/22 1155)     LOS: 1 day   Time spent:  Azucena Fallen, DO [facility]  If 7PM-7AM, please contact night-coverage [email or web address]  08/28/2022, 8:20 AM

## 2022-08-29 DIAGNOSIS — N321 Vesicointestinal fistula: Secondary | ICD-10-CM | POA: Diagnosis not present

## 2022-08-29 LAB — CBC
HCT: 21.9 % — ABNORMAL LOW (ref 36.0–46.0)
Hemoglobin: 7.3 g/dL — ABNORMAL LOW (ref 12.0–15.0)
MCH: 32.4 pg (ref 26.0–34.0)
MCHC: 33.3 g/dL (ref 30.0–36.0)
MCV: 97.3 fL (ref 80.0–100.0)
Platelets: 107 10*3/uL — ABNORMAL LOW (ref 150–400)
RBC: 2.25 MIL/uL — ABNORMAL LOW (ref 3.87–5.11)
RDW: 17.2 % — ABNORMAL HIGH (ref 11.5–15.5)
WBC: 3.9 10*3/uL — ABNORMAL LOW (ref 4.0–10.5)
nRBC: 0 % (ref 0.0–0.2)

## 2022-08-29 LAB — URINE CULTURE

## 2022-08-29 LAB — BASIC METABOLIC PANEL
Anion gap: 5 (ref 5–15)
BUN: 6 mg/dL — ABNORMAL LOW (ref 8–23)
CO2: 25 mmol/L (ref 22–32)
Calcium: 7.7 mg/dL — ABNORMAL LOW (ref 8.9–10.3)
Chloride: 101 mmol/L (ref 98–111)
Creatinine, Ser: 0.89 mg/dL (ref 0.44–1.00)
GFR, Estimated: >60 mL/min (ref >60)
Glucose, Bld: 100 mg/dL — ABNORMAL HIGH (ref 70–99)
Potassium: 3.4 mmol/L — ABNORMAL LOW (ref 3.5–5.1)
Sodium: 131 mmol/L — ABNORMAL LOW (ref 135–145)

## 2022-08-29 NOTE — Progress Notes (Addendum)
Urology Progress Note    Subjective: Patient with catheter issues yesterday. Upsized to 20Fr foley by RN staff with good effect, now draining appropriately. Urine yellow with particulate matter. Reports feeling better with new catheter in place. Urine culture pending.  Objective: Vital signs in last 24 hours: Temp:  [98.4 F (36.9 C)-99.1 F (37.3 C)] 98.4 F (36.9 C) (04/21 0536) Pulse Rate:  [79-83] 79 (04/21 0536) Resp:  [16-18] 18 (04/21 0536) BP: (133-143)/(69-77) 133/69 (04/21 0536) SpO2:  [98 %-100 %] 100 % (04/21 0536)  Intake/Output from previous day: 04/20 0701 - 04/21 0700 In: 2162.7 [P.O.:480; I.V.:296.7; Blood:986; IV Piggyback:400] Out: 2300 [Urine:2300] Intake/Output this shift: No intake/output data recorded.  Physical Exam:  General: Alert and oriented CV: Regular rate Lungs: Normal work of breathing on room air Abdomen: Soft, nontender, nondistended GU: Foley catheter in place to gravity drainage. Urine draining is yellow with particulate matter in tubing Ext: NT, No erythema  Lab Results: Recent Labs    08/27/22 2335 08/28/22 0100 08/28/22 1200  HGB 6.3* 6.8* 7.4*  HCT 19.3* 20.4* 22.8*    BMET Recent Labs    08/27/22 0823 08/28/22 0100  NA 127* 128*  K 3.9 3.8  CL 97* 98  CO2 22 22  GLUCOSE 135* 105*  BUN 14 12  CREATININE 0.95 0.97  CALCIUM 8.1* 8.1*      Studies/Results: CT ABDOMEN PELVIS WO CONTRAST  Result Date: 08/27/2022 CLINICAL DATA:  Worsening abdominal pain with history of ovarian cancer. Suspected colovaginal and/or colovesical fistula. Low-grade fever. EXAM: CT ABDOMEN AND PELVIS WITHOUT CONTRAST TECHNIQUE: Multidetector CT imaging of the abdomen and pelvis was performed following the standard protocol without IV contrast. RADIATION DOSE REDUCTION: This exam was performed according to the departmental dose-optimization program which includes automated exposure control, adjustment of the mA and/or kV according to patient  size and/or use of iterative reconstruction technique. COMPARISON:  CT without contrast 08/23/2022, CT with oral contrast only 08/14/2022, CT without contrast 06/13/2022 FINDINGS: Lower chest: No acute abnormality. Coronary artery calcifications and minimal chronic pericardial effusion are again noted. Normal heart size. The cardiac blood pool is less dense than the myocardium, consistent with anemia. This was seen previously. Hepatobiliary: There is a 1.1 cm cyst again noted in hepatic segment 8, Hounsfield density of 7.6. No other focal abnormality of liver is visible without contrast. The gallbladder is unremarkable. Prominent common bile duct is again noted measuring 12 mm, unchanged. Pancreas: No abnormality. Spleen: No abnormality.  No splenomegaly. Adrenals/Urinary Tract: There is no adrenal mass. Severe chronic atrophy again noted left kidney with small cysts. Right renal cortex is normal in attenuation. There has developed bilateral moderate hydroureteronephrosis into the pelvis where the ureters are difficult to follow due to the pre-existing ongoing inflammatory process along the sigmoid colon. There is likely ureteral tethering due to the inflammatory process contributing to obstructive uropathy. No stone disease is seen. The bladder is distended with air and fluid. There is a moderate amount of patchy enteric contrast and settling in the dependent bladder on the left-greater-than-right and now noted a demonstrable fistulous tract from the diseased abutting sigmoid colon for the left dorsal wall of the bladder, best seen on 2:64 and 65 and on sagittal reconstruction images 79-86. There previously was no enteric contrast in the bladder. Stomach/Bowel: The stomach is contracted. A 3.5 cm descending duodenal diverticulum is again noted, with air contrast level. No small bowel dilatation or inflammation is seen. The appendix is not seen. There is moderate  retained dense contrasted stool again in the  ascending, transverse descending colon. Diverticulosis greatest in the sigmoid is seen with continued sigmoid wall thickening and inflammatory stranding. Further evaluation to exclude underlying colonic lesion is recommended when clinically feasible. Inflammatory reaction around the diseased segment has increased since April 15 and certainly since April 6 as well. Despite the ongoing inflammatory process no free air or abscess is seen in the area. No focal rectal abnormality is seen. Vascular/Lymphatic: Aortic atherosclerosis. No enlarged abdominal or pelvic lymph nodes. Reproductive: There previously was a well seen colovaginal fistula from the undersurface of the sigmoid colon into the left superior vaginal cavity, but this is not as well seen today. Contrast in the vagina is noted however, consistent with continued patency of the tract. The uterus is absent. No adnexal mass is seen. Other: Small amounts of increased pelvic reactive fluid, increased trace reactive fluid along the distal left paracolic gutter. No free air, free hemorrhage or abscess is seen. There are no incarcerated hernias. Musculoskeletal: There is dextroscoliosis, osteopenia and advanced degenerative change of the lumbar spine, with acquired spinal stenosis again noted L4-5. No destructive osseous or lytic lesions. IMPRESSION: 1. Continued sigmoid wall thickening and inflammatory reaction with diverticulosis with increased inflammation and layering reactive pelvic fluid since April 15. Further evaluation to exclude underlying colonic lesion is recommended when clinically feasible. 2. There is now a demonstrable fistulous tract from the sigmoid colon into the left dorsal bladder wall, with air and fluid in the bladder and patchy enteric contrast now settling in the dependent bladder. 3. The known left-sided colovaginal fistula is not well seen today but there is contrast in the vagina consistent with continued tract patency. 4. There is now  moderate bilateral hydroureteronephrosis into the pelvis where the ureters are difficult to follow due to the pre-existing inflammatory process, with ureteral tethering by inflammatory process most likely causing the obstructive uropathy. No stone disease is seen. 5. Severe chronic atrophy of the left kidney. 6. Aortic and coronary artery atherosclerosis. 7. Anemia. 8. Prominent common bile duct at 12 mm, unchanged. 9. Constipation. 10. Osteopenia, scoliosis, and advanced degenerative change of the lumbar spine with acquired spinal stenosis L4-5. 11. These results will be called to the ordering clinician or representative by the Radiologist Assistant, and communication documented in the PACS or [employer]. Aortic Atherosclerosis (ICD10-I70.0). Electronically Signed   By: Almira Bar M.D.   On: 08/27/2022 21:39    Assessment/Plan:  77 y.o. female admitted with pneumaturia and dysuria with colovesical fistula on recent CT imaging.  -No indications for acute urologic surgical intervention at this time. -Continue foley catheter to gravity drainage. -Defer management of colovesical fistula to general surgery.   Please page urology with any questions or concerns   LOS: 2 days   Treyven Lafauci Alic Hilburn 08/29/2022, 9:06 AM

## 2022-08-29 NOTE — Progress Notes (Signed)
Pt urine from the foley  was mixed with stool and it was clogged. On call provider notified  who requested from this nurse to page the urology on call with any concern regarding foley. On call urologist notified  who placed an order to replace  pt current foley with  20 french. Foley inserted, pt tolerated the procedure well.

## 2022-08-29 NOTE — Progress Notes (Signed)
PROGRESS NOTE    MAURISHA MONGEAU  ZOX:[id number] DOB: 1945/12/05 DOA: 08/27/2022 PCP: Raliegh Ip, DO   Brief Narrative:  April Weber is a 77 y.o. female with medical history significant of osteoarthritis, right breast cancer, stage IIIa CKD,, single kidney, history of DVT, dyspnea, hyperlipidemia, hypertension, osteopenia, peripheral vascular disease, seasonal allergies, hypothyroidism who presented to the emergency department complaints of decreased appetite, nausea, lower abdominal pain, constipation and low-grade fever for the past few days.    Patient has known colovesicular and colovaginal fistula discovered earlier this month after recurrent infections.  CT abdomen pelvis without contrast done just 4 days prior to admission shows increased large volume of gas within the bladder.  Of note she just recently completed chemotherapy the first week of April.  As such urology surgery and oncology have been called in consult.  Hospitalist has been called for admission.  Assessment & Plan:   Principal Problem:   Colovesical fistula Active Problems:   Hyperlipidemia   HTN (hypertension)   Hypothyroidism   Stage 3a chronic kidney disease   Hyponatremia   Right ovarian epithelial cancer   Sepsis secondary to subacute vs chronic colovesical/colovaginal fistulas, POA -Fistulas appear to be well-known to the hospital system, repeat imaging last night demonstrates increased inflammation and pelvic fluid from earlier this week. -Urology and general surgery have been consulted, they are aware of the patient.  Unclear at this point what our surgical options are if any. -Continue broad-spectrum antibiotics(ceftriaxone, Flagyl) -Continue full liquid diet until definitive surgical approach can be determined  Hyponatremia, hypovolemic -Continue IV fluids, urinalysis pointless given fistula/contamination   Acute on chronic anemia of chronic disease  -Baseline anemia down overnight  likely secondary to worsening infection and poor p.o. intake -Rule out acute bleed -none noted per stool or urine, CT abdomen pelvis without overt intra-abdominal bleeding -Status post transfusion, repeat hemoglobin within normal limits  Stage 3a chronic kidney disease -At baseline, follow renal function, urine output appropriate   Hyperlipidemia -Low-salt low-fat diet, resume statin once able to take p.o. safely   HTN (hypertension) - Home atenolol on hold, previously normotensive in setting of above   Hypothyroidism Resume home thyroid supplementation  Right ovarian epithelial cancer Follow-up with Dr. Bertis Ruddy as scheduled. Status post chemotherapy first week of April  DVT prophylaxis: None until acute bleed can be ruled out Code Status: Full Family Communication: Daughter at bedside  Status is: Inpatient  Dispo: The patient is from: Home              Anticipated d/c is to: To be determined              Anticipated d/c date is: To be determined -expect prolonged hospitalization              Patient currently not medically stable for discharge  Consultants:  Urology, general surgery, oncology  Procedures:  None currently planned, follow consult recommendations as above  Antimicrobials:  Ceftriaxone, Flagyl  Subjective: No acute issues or events overnight, denies nausea vomiting diarrhea constipation headache fevers chills or chest pain.  Ongoing vaginal discharge but reports replacement of Foley last night improved her prior symptoms rather acutely..  Objective: Vitals:   08/28/22 0536 08/28/22 0653 08/28/22 2213 08/29/22 0536  BP: (!) 154/74 (!) 156/81 (!) 143/77 133/69  Pulse: 90  83 79  Resp: 16 16 16 18   Temp: (!) 100.7 F (38.2 C) 99 F (37.2 C) 99.1 F (37.3 C) 98.4 F (36.9 C)  TempSrc:   Oral Oral  SpO2: 98% 100% 98% 100%  Weight:      Height:        Intake/Output Summary (Last 24 hours) at 08/29/2022 0748 Last data filed at 08/29/2022 0600 Gross  per 24 hour  Intake 2162.73 ml  Output 2300 ml  Net -137.27 ml    Filed Weights   08/27/22 0743  Weight: 71.7 kg    Examination:  General:  Pleasantly resting in bed, No acute distress. HEENT:  Normocephalic atraumatic.  Sclerae nonicteric, noninjected.  Extraocular movements intact bilaterally. Neck:  Without mass or deformity.  Trachea is midline. Lungs:  Clear to auscultate bilaterally without rhonchi, wheeze, or rales. Heart:  Regular rate and rhythm.  Without murmurs, rubs, or gallops. Abdomen:  Soft, nontender, nondistended.  Without guarding or rebound. GI/GU: Foul discharge noted vaginally, urine at bedside canister noted to be feculent  Data Reviewed: I have personally reviewed following labs and imaging studies  CBC: Recent Labs  Lab 08/23/22 2013 08/26/22 1207 08/27/22 0823 08/27/22 2335 08/28/22 0100 08/28/22 1200  WBC 5.1 6.1 5.4  --  4.9  --   NEUTROABS 3.7 4.9 4.4  --   --   --   HGB 7.3* 7.9* 7.2* 6.3* 6.8* 7.4*  HCT 21.5* 23.9* 21.8* 19.3* 20.4* 22.8*  MCV 96.0 98.0 99.1  --  100.0  --   PLT 186 180 133*  --  115*  --     Basic Metabolic Panel: Recent Labs  Lab 08/23/22 2013 08/27/22 0823 08/28/22 0100  NA 129* 127* 128*  K 4.2 3.9 3.8  CL 98 97* 98  CO2 21* 22 22  GLUCOSE 98 135* 105*  BUN CREATININE 0.90 0.95 0.97  CALCIUM 8.8* 8.1* 8.1*    GFR: Estimated Creatinine Clearance: 49.3 mL/min (by C-G formula based on SCr of 0.97 mg/dL). Liver Function Tests: Recent Labs  Lab 08/23/22 2013 08/28/22 0100  AST 13* 13*  ALT 10 12  ALKPHOS 37* 35*  BILITOT 0.4 0.5  PROT 5.7* 5.4*  ALBUMIN 3.5 2.5*    Recent Labs  Lab 08/23/22 2013  LIPASE 13    Sepsis Labs: Recent Labs  Lab 08/27/22 1020 08/27/22 1430  LATICACIDVEN 0.6 0.8     Recent Results (from the past 240 hour(s))  Urine Culture     Status: Abnormal   Collection Time: 08/23/22 10:35 PM   Specimen: Urine, Clean Catch  Result Value Ref Range Status    Specimen Description   Final    URINE, CLEAN CATCH Performed at [facility], [address], [place], [place] [address]    Special Requests   Final    NONE Performed at [facility], [address] [place], [place] [address]    Culture (A)  Final    <10,000 COLONIES/mL INSIGNIFICANT GROWTH Performed at [facility], [address], [place], [place] [address]    Report Status 08/25/2022 FINAL  Final  Culture, blood (Routine X 2) w Reflex to ID Panel     Status: None (Preliminary result)   Collection Time: 08/27/22 11:00 AM   Specimen: BLOOD  Result Value Ref Range Status   Specimen Description   Final    BLOOD SITE NOT SPECIFIED Performed at [facility], [address] [place], [place] [address]    Special Requests   Final    BOTTLES DRAWN AEROBIC AND ANAEROBIC Blood Culture results may not be optimal due to  an excessive volume of blood received in culture bottles Performed at [facility], [address] [place], [place] [address]    Culture   Final    NO GROWTH < 24 HOURS Performed at [facility], [address], [place], [place] [address]    Report Status PENDING  Incomplete  Culture, blood (Routine X 2) w Reflex to ID Panel     Status: None (Preliminary result)   Collection Time: 08/27/22 11:00 AM   Specimen: BLOOD  Result Value Ref Range Status   Specimen Description   Final    BLOOD BLOOD LEFT WRIST Performed at [facility], [address] [place], [place] [address]    Special Requests   Final    BOTTLES DRAWN AEROBIC AND ANAEROBIC Blood Culture results may not be optimal due to an excessive volume of blood received in culture bottles Performed at [facility], [address] [place], [place] [address]    Culture   Final    NO GROWTH < 24 HOURS Performed at [facility], [address], [place], [place] [address]    Report  Status PENDING  Incomplete  Urine Culture     Status: None (Preliminary result)   Collection Time: 08/27/22  4:39 PM   Specimen: Urine, Random  Result Value Ref Range Status   Specimen Description   Final    URINE, RANDOM Performed at [facility], [address] [place], [place] [address]    Special Requests   Final    NONE Reflexed from [id number] Performed at [facility], [address], [place], [place] [address]    Culture   Final    CULTURE REINCUBATED FOR BETTER GROWTH Performed at [facility], [address] [place], [place] [address]    Report Status PENDING  Incomplete         Radiology Studies: CT ABDOMEN PELVIS WO CONTRAST  Result Date: 08/27/2022 CLINICAL DATA:  Worsening abdominal pain with history of ovarian cancer. Suspected colovaginal and/or colovesical fistula. Low-grade fever. EXAM: CT ABDOMEN AND PELVIS WITHOUT CONTRAST TECHNIQUE: Multidetector CT imaging of the abdomen and pelvis was performed following the standard protocol without IV contrast. RADIATION DOSE REDUCTION: This exam was performed according to the departmental dose-optimization program which includes automated exposure control, adjustment of the mA and/or kV according to patient size and/or use of iterative reconstruction technique. COMPARISON:  CT without contrast 08/23/2022, CT with oral contrast only 08/14/2022, CT without contrast 06/13/2022 FINDINGS: Lower chest: No acute abnormality. Coronary artery calcifications and minimal chronic pericardial effusion are again noted. Normal heart size. The cardiac blood pool is less dense than the myocardium, consistent with anemia. This was seen previously. Hepatobiliary: There is a 1.1 cm cyst again noted in hepatic segment 8, Hounsfield density of 7.6. No other focal abnormality of liver is visible without contrast. The gallbladder is unremarkable. Prominent common bile duct is again noted measuring 12 mm,  unchanged. Pancreas: No abnormality. Spleen: No abnormality.  No splenomegaly. Adrenals/Urinary Tract: There is no adrenal mass. Severe chronic atrophy again noted left kidney with small cysts. Right renal cortex is normal in attenuation. There has developed bilateral moderate hydroureteronephrosis into the pelvis where the ureters are difficult to follow due to the pre-existing ongoing inflammatory process along the sigmoid colon. There is likely ureteral tethering due to the inflammatory process contributing to obstructive uropathy. No stone disease is seen. The bladder is distended with  air and fluid. There is a moderate amount of patchy enteric contrast and settling in the dependent bladder on the left-greater-than-right and now noted a demonstrable fistulous tract from the diseased abutting sigmoid colon for the left dorsal wall of the bladder, best seen on 2:64 and 65 and on sagittal reconstruction images 79-86. There previously was no enteric contrast in the bladder. Stomach/Bowel: The stomach is contracted. A 3.5 cm descending duodenal diverticulum is again noted, with air contrast level. No small bowel dilatation or inflammation is seen. The appendix is not seen. There is moderate retained dense contrasted stool again in the ascending, transverse descending colon. Diverticulosis greatest in the sigmoid is seen with continued sigmoid wall thickening and inflammatory stranding. Further evaluation to exclude underlying colonic lesion is recommended when clinically feasible. Inflammatory reaction around the diseased segment has increased since April 15 and certainly since April 6 as well. Despite the ongoing inflammatory process no free air or abscess is seen in the area. No focal rectal abnormality is seen. Vascular/Lymphatic: Aortic atherosclerosis. No enlarged abdominal or pelvic lymph nodes. Reproductive: There previously was a well seen colovaginal fistula from the undersurface of the sigmoid colon into  the left superior vaginal cavity, but this is not as well seen today. Contrast in the vagina is noted however, consistent with continued patency of the tract. The uterus is absent. No adnexal mass is seen. Other: Small amounts of increased pelvic reactive fluid, increased trace reactive fluid along the distal left paracolic gutter. No free air, free hemorrhage or abscess is seen. There are no incarcerated hernias. Musculoskeletal: There is dextroscoliosis, osteopenia and advanced degenerative change of the lumbar spine, with acquired spinal stenosis again noted L4-5. No destructive osseous or lytic lesions. IMPRESSION: 1. Continued sigmoid wall thickening and inflammatory reaction with diverticulosis with increased inflammation and layering reactive pelvic fluid since April 15. Further evaluation to exclude underlying colonic lesion is recommended when clinically feasible. 2. There is now a demonstrable fistulous tract from the sigmoid colon into the left dorsal bladder wall, with air and fluid in the bladder and patchy enteric contrast now settling in the dependent bladder. 3. The known left-sided colovaginal fistula is not well seen today but there is contrast in the vagina consistent with continued tract patency. 4. There is now moderate bilateral hydroureteronephrosis into the pelvis where the ureters are difficult to follow due to the pre-existing inflammatory process, with ureteral tethering by inflammatory process most likely causing the obstructive uropathy. No stone disease is seen. 5. Severe chronic atrophy of the left kidney. 6. Aortic and coronary artery atherosclerosis. 7. Anemia. 8. Prominent common bile duct at 12 mm, unchanged. 9. Constipation. 10. Osteopenia, scoliosis, and advanced degenerative change of the lumbar spine with acquired spinal stenosis L4-5. 11. These results will be called to the ordering clinician or representative by the Radiologist Assistant, and communication documented in the  PACS or [employer]. Aortic Atherosclerosis (ICD10-I70.0). Electronically Signed   By: Almira Bar M.D.   On: 08/27/2022 21:39    Scheduled Meds:  Chlorhexidine Gluconate Cloth  6 each Topical Daily   liver oil-zinc oxide   Topical Q6H   oxyCODONE-acetaminophen  1 tablet Oral Q8H   sodium chloride flush  10-40 mL Intracatheter Q12H   Continuous Infusions:  cefTRIAXone (ROCEPHIN)  IV 2 g (08/28/22 2223)   lactated ringers 75 mL/hr at 08/28/22 1958   metronidazole 500 mg (08/28/22 2300)     LOS: 2 days   Time spent:  Azucena Fallen,  DO [facility]  If 7PM-7AM, please contact night-coverage [email or web address]  08/29/2022, 7:48 AM

## 2022-08-30 DIAGNOSIS — N321 Vesicointestinal fistula: Secondary | ICD-10-CM | POA: Diagnosis not present

## 2022-08-30 LAB — CBC
HCT: 24.9 % — ABNORMAL LOW (ref 36.0–46.0)
Hemoglobin: 8.1 g/dL — ABNORMAL LOW (ref 12.0–15.0)
MCH: 31.8 pg (ref 26.0–34.0)
MCHC: 32.5 g/dL (ref 30.0–36.0)
MCV: 97.6 fL (ref 80.0–100.0)
Platelets: 105 10*3/uL — ABNORMAL LOW (ref 150–400)
RBC: 2.55 MIL/uL — ABNORMAL LOW (ref 3.87–5.11)
RDW: 16.7 % — ABNORMAL HIGH (ref 11.5–15.5)
WBC: 3.3 10*3/uL — ABNORMAL LOW (ref 4.0–10.5)
nRBC: 0 % (ref 0.0–0.2)

## 2022-08-30 LAB — BASIC METABOLIC PANEL
Anion gap: 10 (ref 5–15)
BUN: 8 mg/dL (ref 8–23)
CO2: 26 mmol/L (ref 22–32)
Calcium: 7.7 mg/dL — ABNORMAL LOW (ref 8.9–10.3)
Chloride: 98 mmol/L (ref 98–111)
Creatinine, Ser: 0.77 mg/dL (ref 0.44–1.00)
GFR, Estimated: >60 mL/min (ref >60)
Glucose, Bld: 93 mg/dL (ref 70–99)
Potassium: 3.5 mmol/L (ref 3.5–5.1)
Sodium: 134 mmol/L — ABNORMAL LOW (ref 135–145)

## 2022-08-30 LAB — BPAM RBC
Blood Product Expiration Date: 202405112359
ISSUE DATE / TIME: 202404200347

## 2022-08-30 LAB — TYPE AND SCREEN: Unit division: 0

## 2022-08-30 LAB — URINE CULTURE: Culture: >=100000 — AB

## 2022-08-30 MED ORDER — NITROFURANTOIN MONOHYD MACRO 100 MG PO CAPS
100.0000 mg | ORAL_CAPSULE | Freq: Two times a day (BID) | ORAL | Status: DC
  Administered 2022-08-30 – 2022-09-01 (×5): 100 mg via ORAL
  Filled 2022-08-30 (×5): qty 1

## 2022-08-30 NOTE — Progress Notes (Signed)
  Transition of Care [facility]) Screening Note   Patient Details  Name: CALANDRA MADURA Date of Birth: [birth year]   Transition of Care [facility]) CM/SW Contact:    Otelia Santee, LCSW Phone Number: 08/30/2022, 2:23 PM    Transition of Care Department [facility]) has reviewed patient and no TOC needs have been identified at this time. We will continue to monitor patient advancement through interdisciplinary progression rounds. If new patient transition needs arise, please place a TOC consult.

## 2022-08-30 NOTE — Progress Notes (Signed)
Progress Note     Subjective: Patient has had catheter placed due to retention 4/20 and subsequently upsized due to issues. She continues to have pain in lower abdomen and pelvis. She describes some nausea from pain but otherwise tolerating FLD   Objective: Vital signs in last 24 hours: Temp:  [97.9 F (36.6 C)-99.5 F (37.5 C)] 98.1 F (36.7 C) (04/22 0610) Pulse Rate:  [66-85] 80 (04/22 0610) Resp:  [18-20] 18 (04/22 0610) BP: (133-172)/(68-79) 159/79 (04/22 0610) SpO2:  [96 %-100 %] 96 % (04/22 0610) Last BM Date : 08/29/22  Intake/Output from previous day: 04/21 0701 - 04/22 0700 In: 2940.2 [P.O.:600; I.V.:2040.2; IV Piggyback:300] Out: 2600 [Urine:2600] Intake/Output this shift: No intake/output data recorded.  PE: General: pleasant, WD, female who is sitting up in chair in NAD Lungs: Respiratory effort nonlabored Abd: soft, ND, no masses, hernias, or organomegaly. Minimal TTP across lower abdomen. Well healed midline surgical scar MSK: all 4 extremities are symmetrical with no cyanosis, clubbing, or edema. Skin: warm and dry Psych: A&Ox3 with an appropriate affect.    Lab Results:  Recent Labs    08/29/22 0959 08/30/22 0526  WBC 3.9* 3.3*  HGB 7.3* 8.1*  HCT 21.9* 24.9*  PLT 107* 105*   BMET Recent Labs    08/29/22 0959 08/30/22 0526  NA 131* 134*  K 3.4* 3.5  CL 101 98  CO2 25 26  GLUCOSE 100* 93  BUN 6* 8  CREATININE 0.89 0.77  CALCIUM 7.7* 7.7*   PT/INR No results for input(s): "LABPROT", "INR" in the last 72 hours. CMP     Component Value Date/Time   NA 134 (L) 08/30/2022 0526   NA 134 06/01/2022 0848   K 3.5 08/30/2022 0526   CL 98 08/30/2022 0526   CO2 26 08/30/2022 0526   GLUCOSE 93 08/30/2022 0526   BUN 8 08/30/2022 0526   BUN 7 (L) 06/01/2022 0848   CREATININE 0.77 08/30/2022 0526   CREATININE 0.93 08/10/2022 0857   CREATININE 0.99 02/05/2013 0757   CALCIUM 7.7 (L) 08/30/2022 0526   PROT 5.4 (L) 08/28/2022 0100   PROT 6.0  06/01/2022 0848   ALBUMIN 2.5 (L) 08/28/2022 0100   ALBUMIN 3.6 (L) 06/01/2022 0848   AST 13 (L) 08/28/2022 0100   AST 12 (L) 08/10/2022 0857   ALT 12 08/28/2022 0100   ALT 11 08/10/2022 0857   ALKPHOS 35 (L) 08/28/2022 0100   BILITOT 0.5 08/28/2022 0100   BILITOT 0.3 08/10/2022 0857   GFRNONAA >60 08/30/2022 0526   GFRNONAA >60 08/10/2022 0857   GFRNONAA 59 (L) 02/05/2013 0757   GFRAA 61 05/20/2020 0850   GFRAA 68 02/05/2013 0757   Lipase     Component Value Date/Time   LIPASE 13 08/23/2022 2013       Studies/Results: No results found.  Anti-infectives: Anti-infectives (From admission, onward)    Start     Dose/Rate Route Frequency Ordered Stop   08/27/22 2200  cefTRIAXone (ROCEPHIN) 2 g in sodium chloride 0.9 % 100 mL IVPB        2 g 200 mL/hr over 30 Minutes Intravenous Every 24 hours 08/27/22 1137     08/27/22 1030  cefTRIAXone (ROCEPHIN) 1 g in sodium chloride 0.9 % 100 mL IVPB  Status:  Discontinued        1 g 200 mL/hr over 30 Minutes Intravenous Every 24 hours 08/27/22 1021 08/27/22 1137   08/27/22 1030  metroNIDAZOLE (FLAGYL) IVPB 500 mg  500 mg 100 mL/hr over 60 Minutes Intravenous Every 12 hours 08/27/22 1021          Assessment/Plan  77 y/o F with hx of ovarian cancer who presents with concerns of colovaginal fistula and colovesical fistula - Afebrile, VSS, WBC 3.3 (she is immunosuppressed) - CT 4/15 with increased air in the bladder, stable to improving inflammatory changes around the sigmoid colon, and some gas around the left vaginal cuff. No large volume free air. No abscess. - CT 4/19 with inc inflammation and reactive pelvic fluid. Fistulous tract from the sigmoid colon into the left dorsal bladder wall, with air and fluid in the bladder. Contrast in vagina with known colovaginal fistula. New b/l hydroureteronephrosis - acute on chronic anemia - no overt bleeding on CT. S/p transfusion. Hgb now 8.1  - tolerating FLD - continue today - cont  abx - No indication for emergency surgery at this time.  - if symptoms controlled and patient clinically progressing could plan for outpatient colorectal follow up for one stage procedure. Due to ongoing symptoms I am concerned she may need surgical intervention acutely which would entail diversion and colostomy. She is concerned about having a colostomy. Will ask WOC RN to see today and discuss further with urology   FEN - FLD, IVF per [facility] VTE - SCDs, okay for chemical prophylaxis from our standpoint ID - Zosyn, rocephin/flagyl  Ckd 3a HTN HLD R ovarian epithelial cancer  I reviewed Consultant urology notes, hospitalist notes, last 24 h vitals and pain scores, last 48 h intake and output, last 24 h labs and trends, and last 24 h imaging results.     LOS: 3 days   Eric Form, [facility] 08/30/2022, 10:40 AM Please see Amion for pager number during day hours 7:00am-4:30pm

## 2022-08-30 NOTE — Consult Note (Signed)
WOC Nurse requested for preoperative stoma site marking  Discussed surgical procedure and stoma creation with patient in detail.  Explained role of the WOC nurse team.  Provided the patient with educational booklet and provided samples of pouching options.  Answered patient questions in detail. 45 mins spent with patient to discuss surgery, post op course, takedown surgery expectations, clothing choices, rationale for marking preop, rationale for marking for both ileostomy and colostomy, differences in the two stomas, diet and medications, recovery time, nutritional status. Post op optimization for surgery.   Examined patient lying, sitting, and standing in order to place the marking in the patient's visual field, away from any creases or abdominal contour issues and within the rectus muscle.  Attempted to mark below the patient's belt line.   Marked for colostomy in the LLQ  _6___ cm to the left of the umbilicus and __3__cm below the umbilicus.  Marked for ileostomy in the RLQ  _4___cm to the right of the umbilicus and  __2__ cm below the umbilicus.   Patient's abdomen cleansed with CHG wipes at site markings, allowed to air dry prior to marking.Covered mark with thin film transparent dressing to preserve mark until date of surgery.   WOC Nurse team will follow up with patient after surgery for continue ostomy care and teaching.  Tereso Unangst [facility] MSN, RN,CWOCN, CNS, [employer] [phone]

## 2022-08-30 NOTE — Progress Notes (Signed)
April Weber   DOB:1945/08/06   ZO#:[id number]    ASSESSMENT & PLAN:   History of ovarian cancer She has no evidence of disease We will not start her on olaparib in the setting of ongoing fistula discharge and recurrent infection Continue supportive care   Colovesical fistula Colovaginal fistula Appreciate general surgery and urology consult She was started on antibiotics   Dehydration Due to GI loss She is receiving IV fluids   Anemia chronic illness She has received blood transfusion this weekend Monitor closely   Severe abdominal pain We have a long discussion about the role of scheduled pain medicine versus intermittent pain medicine as needed Ultimately, she is in agreement for scheduled low-dose pain medicine along with as needed as needed Her pain is better controlled today  Goals of care Improvement of pain control and definitive treatment for fistula   Discharge planning Unknown I will follow intermittently  All questions were answered. The patient knows to call the clinic with any problems, questions or concerns.   The total time spent in the appointment was 30 minutes encounter with patients including review of chart and various tests results, discussions about plan of care and coordination of care plan  [name], MD 08/30/2022 9:33 AM  Subjective:  Events over the weekend were noted.  I reviewed her CT imaging She felt better with Foley catheter placement Pain is reasonably controlled She received blood transfusion She is anxious for definitive plan to treat her fistula  Objective:  Vitals:   08/29/22 2238 08/30/22 0610  BP: (!) 148/68 (!) 159/79  Pulse: 85 80  Resp: 20 18  Temp:  98.1 F (36.7 C)  SpO2: 99% 96%     Intake/Output Summary (Last 24 hours) at 08/30/2022 0933 Last data filed at 08/30/2022 0600 Gross per 24 hour  Intake 2940.21 ml  Output 2600 ml  Net 340.21 ml    GENERAL:alert, no distress and comfortable  NEURO: alert &  oriented x 3 with fluent speech, no focal motor/sensory deficits   Labs:  Recent Labs    06/01/22 0848 06/07/22 0824 08/14/22 1929 08/23/22 2013 08/27/22 0823 08/28/22 0100 08/29/22 0959 08/30/22 0526  NA 134   < > 133* 129*   < > 128* 131* 134*  K 4.2   < > 4.2 4.2   < > 3.8 3.4* 3.5  CL 97   < > 100 98   < > 98 101 98  CO2 22   < > 23 21*   < > 22 25 26   GLUCOSE 93   < > 123* 98   < > 105* 100* 93  BUN 7*   < > 32* 16   < > 12 6* 8  CREATININE 0.90   < > 0.79 0.90   < > 0.97 0.89 0.77  CALCIUM 8.8   < > 8.8* 8.8*   < > 8.1* 7.7* 7.7*  GFRNONAA  --    < > >60 >60   < > >60 >60 >60  PROT 6.0   < > 6.2* 5.7*  --  5.4*  --   --   ALBUMIN 3.6*   < > 3.7 3.5  --  2.5*  --   --   AST 21   < > 12* 13*  --  13*  --   --   ALT 19   < > 11 10  --  12  --   --   ALKPHOS 101   < >  50 37*  --  35*  --   --   BILITOT 0.2   < > 0.7 0.4  --  0.5  --   --   BILIDIR 0.12  --   --   --   --   --   --   --    < > = values in this interval not displayed.    Studies: I have reviewed her most recent CT imaging CT ABDOMEN PELVIS WO CONTRAST  Result Date: 08/27/2022 CLINICAL DATA:  Worsening abdominal pain with history of ovarian cancer. Suspected colovaginal and/or colovesical fistula. Low-grade fever. EXAM: CT ABDOMEN AND PELVIS WITHOUT CONTRAST TECHNIQUE: Multidetector CT imaging of the abdomen and pelvis was performed following the standard protocol without IV contrast. RADIATION DOSE REDUCTION: This exam was performed according to the departmental dose-optimization program which includes automated exposure control, adjustment of the mA and/or kV according to patient size and/or use of iterative reconstruction technique. COMPARISON:  CT without contrast 08/23/2022, CT with oral contrast only 08/14/2022, CT without contrast 06/13/2022 FINDINGS: Lower chest: No acute abnormality. Coronary artery calcifications and minimal chronic pericardial effusion are again noted. Normal heart size. The cardiac blood  pool is less dense than the myocardium, consistent with anemia. This was seen previously. Hepatobiliary: There is a 1.1 cm cyst again noted in hepatic segment 8, Hounsfield density of 7.6. No other focal abnormality of liver is visible without contrast. The gallbladder is unremarkable. Prominent common bile duct is again noted measuring 12 mm, unchanged. Pancreas: No abnormality. Spleen: No abnormality.  No splenomegaly. Adrenals/Urinary Tract: There is no adrenal mass. Severe chronic atrophy again noted left kidney with small cysts. Right renal cortex is normal in attenuation. There has developed bilateral moderate hydroureteronephrosis into the pelvis where the ureters are difficult to follow due to the pre-existing ongoing inflammatory process along the sigmoid colon. There is likely ureteral tethering due to the inflammatory process contributing to obstructive uropathy. No stone disease is seen. The bladder is distended with air and fluid. There is a moderate amount of patchy enteric contrast and settling in the dependent bladder on the left-greater-than-right and now noted a demonstrable fistulous tract from the diseased abutting sigmoid colon for the left dorsal wall of the bladder, best seen on 2:64 and 65 and on sagittal reconstruction images 79-86. There previously was no enteric contrast in the bladder. Stomach/Bowel: The stomach is contracted. A 3.5 cm descending duodenal diverticulum is again noted, with air contrast level. No small bowel dilatation or inflammation is seen. The appendix is not seen. There is moderate retained dense contrasted stool again in the ascending, transverse descending colon. Diverticulosis greatest in the sigmoid is seen with continued sigmoid wall thickening and inflammatory stranding. Further evaluation to exclude underlying colonic lesion is recommended when clinically feasible. Inflammatory reaction around the diseased segment has increased since April 15 and certainly since  April 6 as well. Despite the ongoing inflammatory process no free air or abscess is seen in the area. No focal rectal abnormality is seen. Vascular/Lymphatic: Aortic atherosclerosis. No enlarged abdominal or pelvic lymph nodes. Reproductive: There previously was a well seen colovaginal fistula from the undersurface of the sigmoid colon into the left superior vaginal cavity, but this is not as well seen today. Contrast in the vagina is noted however, consistent with continued patency of the tract. The uterus is absent. No adnexal mass is seen. Other: Small amounts of increased pelvic reactive fluid, increased trace reactive fluid along the distal left paracolic gutter.  No free air, free hemorrhage or abscess is seen. There are no incarcerated hernias. Musculoskeletal: There is dextroscoliosis, osteopenia and advanced degenerative change of the lumbar spine, with acquired spinal stenosis again noted L4-5. No destructive osseous or lytic lesions. IMPRESSION: 1. Continued sigmoid wall thickening and inflammatory reaction with diverticulosis with increased inflammation and layering reactive pelvic fluid since April 15. Further evaluation to exclude underlying colonic lesion is recommended when clinically feasible. 2. There is now a demonstrable fistulous tract from the sigmoid colon into the left dorsal bladder wall, with air and fluid in the bladder and patchy enteric contrast now settling in the dependent bladder. 3. The known left-sided colovaginal fistula is not well seen today but there is contrast in the vagina consistent with continued tract patency. 4. There is now moderate bilateral hydroureteronephrosis into the pelvis where the ureters are difficult to follow due to the pre-existing inflammatory process, with ureteral tethering by inflammatory process most likely causing the obstructive uropathy. No stone disease is seen. 5. Severe chronic atrophy of the left kidney. 6. Aortic and coronary artery  atherosclerosis. 7. Anemia. 8. Prominent common bile duct at 12 mm, unchanged. 9. Constipation. 10. Osteopenia, scoliosis, and advanced degenerative change of the lumbar spine with acquired spinal stenosis L4-5. 11. These results will be called to the ordering clinician or representative by the Radiologist Assistant, and communication documented in the PACS or [employer]. Aortic Atherosclerosis (ICD10-I70.0). Electronically Signed   By: Almira Bar M.D.   On: 08/27/2022 21:39   CT ABDOMEN PELVIS WO CONTRAST  Result Date: 08/23/2022 CLINICAL DATA:  Abdominal pain, acute, non localized, colovaginal fistula on prior imaging with worsening abdominal pain. Urinary retention EXAM: CT ABDOMEN AND PELVIS WITHOUT CONTRAST TECHNIQUE: Multidetector CT imaging of the abdomen and pelvis was performed following the standard protocol without IV contrast. RADIATION DOSE REDUCTION: This exam was performed according to the departmental dose-optimization program which includes automated exposure control, adjustment of the mA and/or kV according to patient size and/or use of iterative reconstruction technique. COMPARISON:  CT abdomen and pelvis 08/14/2022 FINDINGS: Lower chest: No acute abnormality. Hepatobiliary: No suspicious focal hepatic lesion. Mild distention of the gallbladder. Unchanged dilation of the common bile duct measuring up to 14 mm. No radiopaque stones or evidence of cholecystitis. Pancreas: Unremarkable. Spleen: Unremarkable. Adrenals/Urinary Tract: Normal adrenal glands. Severe atrophy of the left kidney. Normal appearance of the right kidney. No urinary calculi. Slight decrease in pelviectasis and dilation of the left ureter. Increased large volume gas within the bladder. The inflamed sigmoid colon abuts the superior left aspect of the bladder. Given the increased large amount of gas a colovesical fistula is difficult to exclude. Stomach/Bowel: Diverticulosis and wall thickening of the sigmoid colon  with adjacent inflammatory change is similar to slightly decreased from 08/14/2022. Small amount of pneumatosis in the sigmoid colon near the area of the colovaginal fistula. The previous collection of gas involving the wall of the sigmoid colon has decreased. There is a small amount of residual gas in the area of the left vaginal cuff (series 2/image 67). There is again a soft tissue tract extending from the vaginal cuff to the sigmoid colon (circa series 5/image 39) though no gas is seen within this tract. Normal caliber large and small bowel. Moderate colonic stool load mixed with contrast. Decreased size and wall thickening about the large diverticulum in the proximal transverse colon (series 5/image 52). Redemonstrated large duodenal diverticulum. Vascular/Lymphatic: Aortic atherosclerosis. No enlarged abdominal or pelvic lymph nodes. Reproductive: Hysterectomy.  No adnexal mass. Other: Small volume free fluid in the pelvis. No free intraperitoneal air. Musculoskeletal: No acute osseous abnormality. IMPRESSION: 1. Increased large volume gas within the bladder. This raises concern for colovesical fistula. Consider CT cystogram for further evaluation. 2. Similar to slightly decreased inflammatory changes surrounding the sigmoid colon. The previous collection of gas involving the wall of the sigmoid colon has decreased. There is a small amount of residual gas in the area of the left vaginal cuff. There is again a soft tissue tract extending from the vaginal cuff to the sigmoid colon, though no gas is seen within this tract. 3. Decreased size and wall thickening about the large diverticulum in the proximal transverse colon. 4. Slight decrease in pelviectasis and dilation of the left ureter. Aortic Atherosclerosis (ICD10-I70.0). Electronically Signed   By: Minerva Fester M.D.   On: 08/23/2022 21:39   CT ABDOMEN PELVIS WO CONTRAST  Result Date: 08/14/2022 CLINICAL DATA:  Pelvic pain; history of ovarian cancer  EXAM: CT ABDOMEN AND PELVIS WITHOUT CONTRAST TECHNIQUE: Multidetector CT imaging of the abdomen and pelvis was performed following the standard protocol without IV contrast. RADIATION DOSE REDUCTION: This exam was performed according to the departmental dose-optimization program which includes automated exposure control, adjustment of the mA and/or kV according to patient size and/or use of iterative reconstruction technique. COMPARISON:  Multiple priors, most recent CT abdomen and pelvis dated June 13, 2022 FINDINGS: Lower chest: No acute abnormality. Hepatobiliary: Stable small low-attenuation lesion of the right lobe of the liver measuring 7 mm on series 2, image 14, likely a simple cysts. Gallbladder is unremarkable. Biliary ductal dilation. Pancreas: Unremarkable. No pancreatic ductal dilatation or surrounding inflammatory changes. Spleen: Normal in size without focal abnormality. Adrenals/Urinary Tract: Bilateral adrenal glands are unremarkable. Atrophic left kidney. New pelviectasis and mild dilation of the left ureter. Low-attenuation lesions of the left kidney, largest are compatible with simple cysts, others are too small to accurately characterize, no specific follow-up imaging needed. Compensatory hypertrophy of the right kidney. No hydronephrosis. Wall thickening of the left lateral wall of the urinary bladder with air seen within the urinary bladder. Stomach/Bowel: Contrast material is seen throughout the small and large bowel. Wall thickening of the sigmoid colon with adjacent inflammatory change, decreased when compared with prior exam. New focal collection of gas involving the wall of the sigmoid colon which extends inferiorly to the area of the left vaginal cuff with contrast material seen within the vaginal cuff. Increased wall thickening of a large colonic diverticulum of the proximal transverse colon, best seen on series 5, image 61. Large duodenal diverticulum. No evidence of obstruction.  Vascular/Lymphatic: Aortic atherosclerosis. No enlarged abdominal or pelvic lymph nodes. Reproductive: No adnexal masses. Other: Postsurgical changes of the anterior abdomen. Musculoskeletal: No acute or significant osseous findings. IMPRESSION: 1. Wall thickening of the sigmoid colon with adjacent inflammatory change, overall improved when compared with prior exam. New focal collection of gas involving the wall of the sigmoid colon which extends inferiorly to the area of the left vaginal cuff with contrast material seen within the vaginal cuff. 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 2. Wall thickening of the left lateral wall of the urinary bladder with air seen within the urinary bladder, findings raise concern for additional area of fistulization to the bladder. CT cystogram could be performed for better evaluation. 3. New pelviectasis and mild dilation of the left ureter, likely secondary to inflammatory change at the area of the left ureterovesicular junction. Chronic atrophy of  the left kidney. 4. Increased wall thickening of a large colonic diverticulum of the proximal transverse colon, likely due to diverticulitis. 5. Aortic Atherosclerosis (ICD10-I70.0). Electronically Signed   By: Allegra Lai M.D.   On: 08/14/2022 17:06

## 2022-08-30 NOTE — Progress Notes (Signed)
PROGRESS NOTE    April Weber  ZOX:[id number] DOB: [birth year] DOA: 08/27/2022 PCP: Raliegh Ip, DO   Brief Narrative:  April Weber is a 77 y.o. female with medical history significant of osteoarthritis, right breast cancer, stage IIIa CKD,, single kidney, history of DVT, dyspnea, hyperlipidemia, hypertension, osteopenia, peripheral vascular disease, seasonal allergies, hypothyroidism who presented to the emergency department complaints of decreased appetite, nausea, lower abdominal pain, constipation and low-grade fever for the past few days.    Patient has known colovesicular and colovaginal fistula discovered earlier this month after recurrent infections.  CT abdomen pelvis without contrast done just 4 days prior to admission shows increased large volume of gas within the bladder.  Of note she just recently completed chemotherapy the first week of April.  As such urology surgery and oncology have been called in consult.  Hospitalist has been called for admission.  Assessment & Plan:   Principal Problem:   Colovesical fistula Active Problems:   Hyperlipidemia   HTN (hypertension)   Hypothyroidism   Stage 3a chronic kidney disease   Hyponatremia   Right ovarian epithelial cancer   Sepsis secondary to subacute vs chronic colovesical/colovaginal fistulas, POA -Fistulas appear to be well-known to the hospital system, repeat imaging last night demonstrates increased inflammation and pelvic fluid from earlier this week. -Urology and general surgery have been consulted. -Continue broad-spectrum antibiotics(ceftriaxone, Flagyl) -Urine culture remarkable for moderately resistant staph hemolyticus, continue additional Macrobid per ID -Continue full liquid diet until definitive surgical approach can be determined -Tentative plan per discussion with patient is possible to stage surgical procedure with ostomy versus discharge home with single-stage procedure in the near future.   Patient indicates to me that she would prefer to have at least partial procedure done here to improve her symptoms even if she requires an ostomy  Hyponatremia, hypovolemic -Continue IV fluids, urinalysis markedly abnormal given fistula/contamination   Acute on chronic anemia of chronic disease  -Baseline anemia down overnight likely secondary to worsening infection and poor p.o. intake -Rule out acute bleed -none noted per stool or urine, CT abdomen pelvis without overt intra-abdominal bleeding -Status post transfusion, repeat hemoglobin within normal limits  Stage 3a chronic kidney disease -Renal function appears to be within normal limits, will have patient follow-up with PCP to further classify her CKD staging   Hyperlipidemia -Low-salt low-fat diet, resume statin once able to take p.o. safely   HTN (hypertension) - Home atenolol on hold, previously normotensive in setting of above   Hypothyroidism Resume home thyroid supplementation  Right ovarian epithelial cancer Follow-up with Dr. Bertis Ruddy as scheduled. Status post chemotherapy first week of April  DVT prophylaxis: None until acute bleed can be ruled out Code Status: Full Family Communication: Daughter at bedside  Status is: Inpatient  Dispo: The patient is from: Home              Anticipated d/c is to: To be determined              Anticipated d/c date is: To be determined -expect prolonged hospitalization              Patient currently not medically stable for discharge  Consultants:  Urology, general surgery, oncology  Procedures:  Tentative two-stage procedure as outlined by surgery above  Antimicrobials:  Ceftriaxone, Flagyl  Subjective: No acute issues or events overnight, patient remains somewhat frustrated about her medical care but understands now the possible options of a two-stage versus 1 stage procedure.  She  denies overt nausea vomiting constipation headache fevers chills or chest  pain.  Objective: Vitals:   08/29/22 1418 08/29/22 2049 08/29/22 2238 08/30/22 0610  BP: 133/71 (!) 172/79 (!) 148/68 (!) 159/79  Pulse: 66 72 85 80  Resp: 18 20 20 18   Temp: 99.5 F (37.5 C) 97.9 F (36.6 C)  98.1 F (36.7 C)  TempSrc: Oral Oral  Oral  SpO2: 100% 100% 99% 96%  Weight:      Height:        Intake/Output Summary (Last 24 hours) at 08/30/2022 0828 Last data filed at 08/30/2022 0600 Gross per 24 hour  Intake 2940.21 ml  Output 2600 ml  Net 340.21 ml    Filed Weights   08/27/22 0743  Weight: 71.7 kg    Examination:  General:  Pleasantly resting in bed, No acute distress. HEENT:  Normocephalic atraumatic.  Sclerae nonicteric, noninjected.  Extraocular movements intact bilaterally. Neck:  Without mass or deformity.  Trachea is midline. Lungs:  Clear to auscultate bilaterally without rhonchi, wheeze, or rales. Heart:  Regular rate and rhythm.  Without murmurs, rubs, or gallops. Abdomen:  Soft, nontender, nondistended.  Without guarding or rebound. GI/GU: Urine at bedside bag noted to be feculent/brown  Data Reviewed: I have personally reviewed following labs and imaging studies  CBC: Recent Labs  Lab 08/23/22 2013 08/26/22 1207 08/27/22 0823 08/27/22 2335 08/28/22 0100 08/28/22 1200 08/29/22 0959 08/30/22 0526  WBC 5.1 6.1 5.4  --  4.9  --  3.9* 3.3*  NEUTROABS 3.7 4.9 4.4  --   --   --   --   --   HGB 7.3* 7.9* 7.2* 6.3* 6.8* 7.4* 7.3* 8.1*  HCT 21.5* 23.9* 21.8* 19.3* 20.4* 22.8* 21.9* 24.9*  MCV 96.0 98.0 99.1  --  100.0  --  97.3 97.6  PLT 186 180 133*  --  115*  --  107* 105*    Basic Metabolic Panel: Recent Labs  Lab 08/23/22 2013 08/27/22 0823 08/28/22 0100 08/29/22 0959 08/30/22 0526  NA 129* 127* 128* 131* 134*  K 4.2 3.9 3.8 3.4* 3.5  CL 98 97* 98 101 98  CO2 21* 22 22 25 26   GLUCOSE 98 135* 105* 100* 93  BUN 16 14 12  6* 8  CREATININE 0.90 0.95 0.97 0.89 0.77  CALCIUM 8.8* 8.1* 8.1* 7.7* 7.7*    GFR: Estimated  Creatinine Clearance: 59.8 mL/min (by C-G formula based on SCr of 0.77 mg/dL). Liver Function Tests: Recent Labs  Lab 08/23/22 2013 08/28/22 0100  AST 13* 13*  ALT 10 12  ALKPHOS 37* 35*  BILITOT 0.4 0.5  PROT 5.7* 5.4*  ALBUMIN 3.5 2.5*    Recent Labs  Lab 08/23/22 2013  LIPASE 13    Sepsis Labs: Recent Labs  Lab 08/27/22 1020 08/27/22 1430  LATICACIDVEN 0.6 0.8     Recent Results (from the past 240 hour(s))  Urine Culture     Status: Abnormal   Collection Time: 08/23/22 10:35 PM   Specimen: Urine, Clean Catch  Result Value Ref Range Status   Specimen Description   Final    URINE, CLEAN CATCH Performed at [facility], [address] [place], [place] [address]    Special Requests   Final    NONE Performed at [facility], [address] [place], [place] [address]    Culture (A)  Final    <10,000 COLONIES/mL INSIGNIFICANT GROWTH Performed at [facility], [address] [place], [place] [address]  Report Status 08/25/2022 FINAL  Final  Culture, blood (Routine X 2) w Reflex to ID Panel     Status: None (Preliminary result)   Collection Time: 08/27/22 11:00 AM   Specimen: BLOOD  Result Value Ref Range Status   Specimen Description   Final    BLOOD SITE NOT SPECIFIED Performed at [facility], [address] [place], [place] [address]    Special Requests   Final    BOTTLES DRAWN AEROBIC AND ANAEROBIC Blood Culture results may not be optimal due to an excessive volume of blood received in culture bottles Performed at [facility], [address] [place], [place] [address]    Culture   Final    NO GROWTH 3 DAYS Performed at [facility], [address] [place], [place] [address]    Report Status PENDING  Incomplete  Culture, blood (Routine X 2) w Reflex to ID Panel     Status: None (Preliminary result)   Collection Time: 08/27/22 11:00 AM   Specimen: BLOOD   Result Value Ref Range Status   Specimen Description   Final    BLOOD BLOOD LEFT WRIST Performed at [facility], [address] [place], [place] [address]    Special Requests   Final    BOTTLES DRAWN AEROBIC AND ANAEROBIC Blood Culture results may not be optimal due to an excessive volume of blood received in culture bottles Performed at [facility], [address], [place], [place] [address]    Culture   Final    NO GROWTH 3 DAYS Performed at [facility], [address] [place], [place] [address]    Report Status PENDING  Incomplete  Urine Culture     Status: Abnormal (Preliminary result)   Collection Time: 08/27/22  4:39 PM   Specimen: Urine, Random  Result Value Ref Range Status   Specimen Description   Final    URINE, RANDOM Performed at [facility], [address], [place], [place] [address]    Special Requests   Final    NONE Reflexed from [id number] Performed at [facility], [address], [place], [place] [address]    Culture (A)  Final    >=100,000 COLONIES/mL STAPHYLOCOCCUS HAEMOLYTICUS SUSCEPTIBILITIES TO FOLLOW Performed at [facility], [address], [place], [place] [address]    Report Status PENDING  Incomplete         Radiology Studies: No results found.  Scheduled Meds:  Chlorhexidine Gluconate Cloth  6 each Topical Daily   liver oil-zinc oxide   Topical Q6H   oxyCODONE-acetaminophen  1 tablet Oral Q8H   sodium chloride flush  10-40 mL Intracatheter Q12H   Continuous Infusions:  cefTRIAXone (ROCEPHIN)  IV 2 g (08/29/22 2132)   lactated ringers 75 mL/hr at 08/30/22 0449   metronidazole 500 mg (08/29/22 2322)     LOS: 3 days   Time spent:  Azucena Fallen, DO [facility]  If 7PM-7AM, please contact night-coverage [email or web address]  08/30/2022, 8:28 AM

## 2022-08-31 DIAGNOSIS — N321 Vesicointestinal fistula: Secondary | ICD-10-CM | POA: Diagnosis not present

## 2022-08-31 LAB — BASIC METABOLIC PANEL
Anion gap: 8 (ref 5–15)
BUN: 8 mg/dL (ref 8–23)
CO2: 26 mmol/L (ref 22–32)
Calcium: 7.4 mg/dL — ABNORMAL LOW (ref 8.9–10.3)
Chloride: 103 mmol/L (ref 98–111)
Creatinine, Ser: 0.76 mg/dL (ref 0.44–1.00)
GFR, Estimated: >60 mL/min (ref >60)
Glucose, Bld: 95 mg/dL (ref 70–99)
Potassium: 3.9 mmol/L (ref 3.5–5.1)
Sodium: 137 mmol/L (ref 135–145)

## 2022-08-31 LAB — CBC
HCT: 23.9 % — ABNORMAL LOW (ref 36.0–46.0)
Hemoglobin: 7.8 g/dL — ABNORMAL LOW (ref 12.0–15.0)
MCH: 32.4 pg (ref 26.0–34.0)
MCHC: 32.6 g/dL (ref 30.0–36.0)
MCV: 99.2 fL (ref 80.0–100.0)
Platelets: 105 10*3/uL — ABNORMAL LOW (ref 150–400)
RBC: 2.41 MIL/uL — ABNORMAL LOW (ref 3.87–5.11)
RDW: 16.7 % — ABNORMAL HIGH (ref 11.5–15.5)
WBC: 2.2 10*3/uL — ABNORMAL LOW (ref 4.0–10.5)
nRBC: 0 % (ref 0.0–0.2)

## 2022-08-31 MED ORDER — SODIUM CHLORIDE 0.9 % IV SOLN
2.0000 g | INTRAVENOUS | Status: AC
  Administered 2022-09-01: 2 g via INTRAVENOUS
  Filled 2022-08-31 (×2): qty 2

## 2022-08-31 MED ORDER — POLYETHYLENE GLYCOL 3350 17 GM/SCOOP PO POWD
1.0000 | Freq: Once | ORAL | Status: AC
  Administered 2022-08-31: 255 g via ORAL
  Filled 2022-08-31: qty 255

## 2022-08-31 MED ORDER — CHLORHEXIDINE GLUCONATE 4 % EX SOLN
1.0000 | CUTANEOUS | Status: DC
  Filled 2022-08-31: qty 15

## 2022-08-31 MED ORDER — ENSURE PRE-SURGERY PO LIQD
592.0000 mL | Freq: Once | ORAL | Status: DC
  Filled 2022-08-31: qty 592

## 2022-08-31 MED ORDER — ACETAMINOPHEN 500 MG PO TABS
1000.0000 mg | ORAL_TABLET | ORAL | Status: AC
  Administered 2022-09-01: 1000 mg via ORAL
  Filled 2022-08-31: qty 2

## 2022-08-31 MED ORDER — ALVIMOPAN 12 MG PO CAPS
12.0000 mg | ORAL_CAPSULE | ORAL | Status: AC
  Administered 2022-09-01: 12 mg via ORAL
  Filled 2022-08-31 (×2): qty 1

## 2022-08-31 MED ORDER — BUPIVACAINE LIPOSOME 1.3 % IJ SUSP
20.0000 mL | INTRAMUSCULAR | Status: DC
  Filled 2022-08-31: qty 20

## 2022-08-31 MED ORDER — PROCHLORPERAZINE EDISYLATE 10 MG/2ML IJ SOLN
5.0000 mg | Freq: Once | INTRAMUSCULAR | Status: AC | PRN
  Administered 2022-08-31: 5 mg via INTRAVENOUS
  Filled 2022-08-31: qty 2

## 2022-08-31 MED ORDER — METRONIDAZOLE 500 MG PO TABS
1000.0000 mg | ORAL_TABLET | ORAL | Status: AC
  Administered 2022-08-31 (×3): 1000 mg via ORAL
  Filled 2022-08-31 (×3): qty 2

## 2022-08-31 MED ORDER — HEPARIN SODIUM (PORCINE) 5000 UNIT/ML IJ SOLN
5000.0000 [IU] | INTRAMUSCULAR | Status: AC
  Administered 2022-09-01: 5000 [IU] via SUBCUTANEOUS
  Filled 2022-08-31: qty 1

## 2022-08-31 MED ORDER — NEOMYCIN SULFATE 500 MG PO TABS
1000.0000 mg | ORAL_TABLET | ORAL | Status: AC
  Administered 2022-08-31 (×3): 1000 mg via ORAL
  Filled 2022-08-31 (×3): qty 2

## 2022-08-31 MED ORDER — ENSURE PRE-SURGERY PO LIQD
296.0000 mL | Freq: Once | ORAL | Status: AC
  Administered 2022-09-01: 296 mL via ORAL
  Filled 2022-08-31: qty 296

## 2022-08-31 NOTE — Progress Notes (Signed)
      Chief Complaint/Subjective: No new issues  Objective: Vital signs in last 24 hours: Temp:  [98.2 F (36.8 C)] 98.2 F (36.8 C) (04/23 0524) Pulse Rate:  [81-83] 83 (04/23 0524) Resp:  [17-18] 17 (04/23 0524) BP: (167)/(80-86) 167/80 (04/23 0524) SpO2:  [98 %-100 %] 98 % (04/23 0524) Last BM Date : 08/31/22 Intake/Output from previous day: 04/22 0701 - 04/23 0700 In: 891.1 [P.O.:480; I.V.:311.1; IV Piggyback:100] Out: 2225 [Urine:2225]  PE: Gen: NAd Resp: nonlabored Card: RRR Abd: soft, nontender, nondistended  Lab Results:  Recent Labs    08/30/22 0526 08/31/22 0245  WBC 3.3* 2.2*  HGB 8.1* 7.8*  HCT 24.9* 23.9*  PLT 105* 105*   Recent Labs    08/30/22 0526 08/31/22 0245  NA 134* 137  K 3.5 3.9  CL 98 103  CO2 26 26  GLUCOSE 93 95  BUN 8 8  CREATININE 0.77 0.76  CALCIUM 7.7* 7.4*   No results for input(s): "LABPROT", "INR" in the last 72 hours.    Component Value Date/Time   NA 137 08/31/2022 0245   NA 134 06/01/2022 0848   K 3.9 08/31/2022 0245   CL 103 08/31/2022 0245   CO2 26 08/31/2022 0245   GLUCOSE 95 08/31/2022 0245   BUN 8 08/31/2022 0245   BUN 7 (L) 06/01/2022 0848   CREATININE 0.76 08/31/2022 0245   CREATININE 0.93 08/10/2022 0857   CREATININE 0.99 02/05/2013 0757   CALCIUM 7.4 (L) 08/31/2022 0245   PROT 5.4 (L) 08/28/2022 0100   PROT 6.0 06/01/2022 0848   ALBUMIN 2.5 (L) 08/28/2022 0100   ALBUMIN 3.6 (L) 06/01/2022 0848   AST 13 (L) 08/28/2022 0100   AST 12 (L) 08/10/2022 0857   ALT 12 08/28/2022 0100   ALT 11 08/10/2022 0857   ALKPHOS 35 (L) 08/28/2022 0100   BILITOT 0.5 08/28/2022 0100   BILITOT 0.3 08/10/2022 0857   GFRNONAA >60 08/31/2022 0245   GFRNONAA >60 08/10/2022 0857   GFRNONAA 59 (L) 02/05/2013 0757   GFRAA 61 05/20/2020 0850   GFRAA 68 02/05/2013 0757    Assessment/Plan 77 yo female with colovesical fistula with recurrent UTIs FEN - clear liquids and bowel prep today VTE - lovenox ID - cefotetan on  call to OR Disposition - OR tomorrow for colectomy with anastomosis vs ostomy   LOS: 4 days   I reviewed last 24 h vitals and pain scores, last 48 h intake and output, last 24 h labs and trends, and last 24 h imaging results.  This care required high  level of medical decision making.   [name] 08/31/2022, 10:23 AM Please see Amion for pager number during day hours 7:00am-4:30pm or 7:00am -11:30am on weekends

## 2022-08-31 NOTE — Care Management Important Message (Signed)
Important Message  Patient Details IM Letter given Name: April Weber MRN: [id number] Date of Birth: 1945/11/06   Medicare Important Message Given:  Yes     Caren Macadam 08/31/2022, 9:26 AM

## 2022-08-31 NOTE — Progress Notes (Signed)
PROGRESS NOTE    April MICHELLI  Weber:[id number] DOB: Jan 04, 1946 DOA: 08/27/2022 PCP: Raliegh Ip, DO   Brief Narrative:  April Weber is a 77 y.o. female with medical history significant of osteoarthritis, right breast cancer, stage IIIa CKD,, single kidney, history of DVT, dyspnea, hyperlipidemia, hypertension, osteopenia, peripheral vascular disease, seasonal allergies, hypothyroidism who presented to the emergency department complaints of decreased appetite, nausea, lower abdominal pain, constipation and low-grade fever for the past few days.    Patient has known colovesicular and colovaginal fistula discovered earlier this month after recurrent infections.  CT abdomen pelvis without contrast done just 4 days prior to admission shows increased large volume of gas within the bladder.  Of note she just recently completed chemotherapy the first week of April.  As such urology surgery and oncology have been called in consult.  Hospitalist has been called for admission.  Assessment & Plan:   Principal Problem:   Colovesical fistula Active Problems:   Hyperlipidemia   HTN (hypertension)   Hypothyroidism   Stage 3a chronic kidney disease   Hyponatremia   Right ovarian epithelial cancer   Sepsis secondary to subacute vs chronic colovesical/colovaginal fistulas, POA -Fistulas appear to be well-known to the hospital system, repeat imaging last night demonstrates increased inflammation and pelvic fluid from earlier this week. -Urology and general surgery have been consulted. -Continue broad-spectrum antibiotics(ceftriaxone, Flagyl) -Urine culture remarkable for moderately resistant staph hemolyticus, continue additional antibiotic Macrobid per ID -Tentative plan for surgical intervention tomorrow 09/01/2022 -possible two-stage versus single-stage surgical intervention with repair of fistula and possible ostomy with later reanastomosis.  Bowel prep today ongoing n.p.o. at midnight.    Hyponatremia, hypovolemic -Continue IV fluids, urinalysis markedly abnormal given fistula/contamination   Acute on chronic anemia of chronic disease  -Baseline anemia down overnight likely secondary to worsening infection and poor p.o. intake -Rule out acute bleed -none noted per stool or urine, CT abdomen pelvis without overt intra-abdominal bleeding -Status post transfusion, repeat hemoglobin within normal limits  Stage 3a chronic kidney disease -Renal function appears to be within normal limits, will have patient follow-up with PCP to further classify her CKD staging   Hyperlipidemia -Low-salt low-fat diet, resume statin once able to take p.o. safely   HTN (hypertension) - Home atenolol on hold, previously normotensive in setting of above   Hypothyroidism Resume home thyroid supplementation  Right ovarian epithelial cancer Follow-up with Dr. Bertis Ruddy as scheduled. Status post chemotherapy first week of April  DVT prophylaxis: None given worsening anemia concern for possible bleed Code Status: Full Family Communication: Daughter updated over the phone  Status is: Inpatient  Dispo: The patient is from: Home              Anticipated d/c is to: To be determined              Anticipated d/c date is: To be determined -expect prolonged hospitalization              Patient currently not medically stable for discharge  Consultants:  Urology, general surgery, oncology  Procedures:  Tentative two-stage procedure as outlined by surgery above  Antimicrobials:  Ceftriaxone, Flagyl  Subjective: No acute issues or events overnight, patient looking forward to surgery tomorrow, is hoping for single-stage rather than have ostomy placed with required secondary procedure.  Bowel prep today ongoing but otherwise denies nausea vomiting constipation headache fevers chills chest pain shortness of breath.  Objective: Vitals:   08/29/22 2238 08/30/22 0610 08/30/22 2054 08/31/22  0524  BP:  (!) 148/68 (!) 159/79 (!) 167/86 (!) 167/80  Pulse: 85 80 81 83  Resp: Temp:  98.1 F (36.7 C) 98.2 F (36.8 C) 98.2 F (36.8 C)  TempSrc:  Oral  Oral  SpO2: 99% 96% 100% 98%  Weight:      Height:        Intake/Output Summary (Last 24 hours) at 08/31/2022 0744 Last data filed at 08/31/2022 0526 Gross per 24 hour  Intake 891.09 ml  Output 2225 ml  Net -1333.91 ml    Filed Weights   08/27/22 0743  Weight: 71.7 kg    Examination:  General:  Pleasantly resting in bed, No acute distress. HEENT:  Normocephalic atraumatic.  Sclerae nonicteric, noninjected.  Extraocular movements intact bilaterally. Neck:  Without mass or deformity.  Trachea is midline. Lungs:  Clear to auscultate bilaterally without rhonchi, wheeze, or rales. Heart:  Regular rate and rhythm.  Without murmurs, rubs, or gallops. Abdomen:  Soft, nontender, nondistended.  Without guarding or rebound. GI/GU: Urine at bedside bag noted to be feculent/brown  Data Reviewed: I have personally reviewed following labs and imaging studies  CBC: Recent Labs  Lab 08/26/22 1207 08/27/22 0823 08/27/22 2335 08/28/22 0100 08/28/22 1200 08/29/22 0959 08/30/22 0526 08/31/22 0245  WBC 6.1 5.4  --  4.9  --  3.9* 3.3* 2.2*  NEUTROABS 4.9 4.4  --   --   --   --   --   --   HGB 7.9* 7.2*   < > 6.8* 7.4* 7.3* 8.1* 7.8*  HCT 23.9* 21.8*   < > 20.4* 22.8* 21.9* 24.9* 23.9*  MCV 98.0 99.1  --  100.0  --  97.3 97.6 99.2  PLT 180 133*  --  115*  --  107* 105* 105*   < > = values in this interval not displayed.    Basic Metabolic Panel: Recent Labs  Lab 08/27/22 0823 08/28/22 0100 08/29/22 0959 08/30/22 0526 08/31/22 0245  NA 127* 128* 131* 134* 137  K 3.9 3.8 3.4* 3.5 3.9  CL 97* 98 101 98 103  CO2 GLUCOSE 135* 105* 100* 93 95  BUN 14 12 6* 8 8  CREATININE 0.95 0.97 0.89 0.77 0.76  CALCIUM 8.1* 8.1* 7.7* 7.7* 7.4*    GFR: Estimated Creatinine Clearance: 59.8 mL/min (by C-G formula  based on SCr of 0.76 mg/dL). Liver Function Tests: Recent Labs  Lab 08/28/22 0100  AST 13*  ALT 12  ALKPHOS 35*  BILITOT 0.5  PROT 5.4*  ALBUMIN 2.5*    No results for input(s): "LIPASE", "AMYLASE" in the last 168 hours.  Sepsis Labs: Recent Labs  Lab 08/27/22 1020 08/27/22 1430  LATICACIDVEN 0.6 0.8     Recent Results (from the past 240 hour(s))  Urine Culture     Status: Abnormal   Collection Time: 08/23/22 10:35 PM   Specimen: Urine, Clean Catch  Result Value Ref Range Status   Specimen Description   Final    URINE, CLEAN CATCH Performed at [facility], [address] [place], [place] [address]    Special Requests   Final    NONE Performed at [facility], [address] [place], [place] [address]    Culture (A)  Final    <10,000 COLONIES/mL INSIGNIFICANT GROWTH Performed at [facility], [address], [place], [place] [address]    Report Status 08/25/2022 FINAL  Final  Culture,  blood (Routine X 2) w Reflex to ID Panel     Status: None (Preliminary result)   Collection Time: 08/27/22 11:00 AM   Specimen: BLOOD  Result Value Ref Range Status   Specimen Description   Final    BLOOD SITE NOT SPECIFIED Performed at [facility], [address], [place], [place] [address]    Special Requests   Final    BOTTLES DRAWN AEROBIC AND ANAEROBIC Blood Culture results may not be optimal due to an excessive volume of blood received in culture bottles Performed at [facility], [address], [place], [place] [address]    Culture   Final    NO GROWTH 4 DAYS Performed at [facility], [address], [place], [place] [address]    Report Status PENDING  Incomplete  Culture, blood (Routine X 2) w Reflex to ID Panel     Status: None (Preliminary result)   Collection Time: 08/27/22 11:00 AM   Specimen: BLOOD  Result Value Ref Range Status   Specimen Description   Final     BLOOD BLOOD LEFT WRIST Performed at [facility], [address] [place], [place] [address]    Special Requests   Final    BOTTLES DRAWN AEROBIC AND ANAEROBIC Blood Culture results may not be optimal due to an excessive volume of blood received in culture bottles Performed at [facility], [address], [place], [place] [address]    Culture   Final    NO GROWTH 4 DAYS Performed at [facility], [address], [place], [place] [address]    Report Status PENDING  Incomplete  Urine Culture     Status: Abnormal (Preliminary result)   Collection Time: 08/27/22  4:39 PM   Specimen: Urine, Random  Result Value Ref Range Status   Specimen Description   Final    URINE, RANDOM Performed at [facility], [address] [place], [place] [address]    Special Requests   Final    NONE Reflexed from [id number] Performed at [facility], [address], [place], [place] [address]    Culture (A)  Final    >=100,000 COLONIES/mL STAPHYLOCOCCUS HAEMOLYTICUS CULTURE REINCUBATED FOR BETTER GROWTH Performed at [facility], [address], [place], [place] [address]    Report Status PENDING  Incomplete   Organism ID, Bacteria STAPHYLOCOCCUS HAEMOLYTICUS (A)  Final      Susceptibility   Staphylococcus haemolyticus - MIC*    CIPROFLOXACIN >=8 RESISTANT Resistant     GENTAMICIN >=16 RESISTANT Resistant     NITROFURANTOIN <=16 SENSITIVE Sensitive     OXACILLIN >=4 RESISTANT Resistant     TETRACYCLINE 2 SENSITIVE Sensitive     VANCOMYCIN 1 SENSITIVE Sensitive     TRIMETH/SULFA 160 RESISTANT Resistant     CLINDAMYCIN >=8 RESISTANT Resistant     RIFAMPIN >=32 RESISTANT Resistant     Inducible Clindamycin NEGATIVE Sensitive     * >=100,000 COLONIES/mL STAPHYLOCOCCUS HAEMOLYTICUS         Radiology Studies: No results found.  Scheduled Meds:  Chlorhexidine Gluconate Cloth  6 each Topical Daily   liver  oil-zinc oxide   Topical Q6H   nitrofurantoin (macrocrystal-monohydrate)  100 mg Oral Q12H   oxyCODONE-acetaminophen  1 tablet Oral Q8H   sodium chloride flush  10-40 mL Intracatheter Q12H   Continuous Infusions:  cefTRIAXone (ROCEPHIN)  IV 2 g (08/30/22 2725)   lactated  ringers 75 mL/hr at 08/31/22 0526   metronidazole 500 mg (08/30/22 2143)     LOS: 4 days   Time spent:  Azucena Fallen, DO [facility]  If 7PM-7AM, please contact night-coverage [email or web address]  08/31/2022, 7:44 AM

## 2022-08-31 NOTE — Plan of Care (Signed)
  Problem: Clinical Measurements: Goal: Ability to maintain clinical measurements within normal limits will improve Outcome: Progressing   Problem: Clinical Measurements: Goal: Will remain free from infection Outcome: Progressing   Problem: Elimination: Goal: Will not experience complications related to urinary retention Outcome: Progressing   Problem: Safety: Goal: Ability to remain free from injury will improve Outcome: Progressing   Problem: Pain Managment: Goal: General experience of comfort will improve Outcome: Progressing

## 2022-09-01 ENCOUNTER — Inpatient Hospital Stay (HOSPITAL_COMMUNITY): Payer: Medicare Other

## 2022-09-01 ENCOUNTER — Inpatient Hospital Stay (HOSPITAL_COMMUNITY): Payer: Medicare Other | Admitting: Certified Registered Nurse Anesthetist

## 2022-09-01 ENCOUNTER — Encounter (HOSPITAL_COMMUNITY)
Admission: EM | Disposition: A | Discharge status: Home Health | Payer: Self-pay | Source: Home / Self Care | Attending: Internal Medicine

## 2022-09-01 DIAGNOSIS — N1330 Unspecified hydronephrosis: Secondary | ICD-10-CM | POA: Diagnosis not present

## 2022-09-01 DIAGNOSIS — N321 Vesicointestinal fistula: Secondary | ICD-10-CM | POA: Diagnosis not present

## 2022-09-01 DIAGNOSIS — I10 Essential (primary) hypertension: Secondary | ICD-10-CM

## 2022-09-01 DIAGNOSIS — I739 Peripheral vascular disease, unspecified: Secondary | ICD-10-CM

## 2022-09-01 DIAGNOSIS — Z87891 Personal history of nicotine dependence: Secondary | ICD-10-CM

## 2022-09-01 DIAGNOSIS — K56609 Unspecified intestinal obstruction, unspecified as to partial versus complete obstruction: Secondary | ICD-10-CM

## 2022-09-01 HISTORY — PX: CYSTOSCOPY WITH STENT PLACEMENT: SHX5790

## 2022-09-01 HISTORY — PX: COLON RESECTION: SHX5231

## 2022-09-01 HISTORY — PX: BLADDER REPAIR: SHX6721

## 2022-09-01 LAB — BASIC METABOLIC PANEL
Anion gap: 8 (ref 5–15)
BUN: 6 mg/dL — ABNORMAL LOW (ref 8–23)
CO2: 21 mmol/L — ABNORMAL LOW (ref 22–32)
Calcium: 6.7 mg/dL — ABNORMAL LOW (ref 8.9–10.3)
Chloride: 106 mmol/L (ref 98–111)
Creatinine, Ser: 0.66 mg/dL (ref 0.44–1.00)
GFR, Estimated: >60 mL/min (ref >60)
Glucose, Bld: 120 mg/dL — ABNORMAL HIGH (ref 70–99)
Potassium: 2.7 mmol/L — CL (ref 3.5–5.1)
Sodium: 135 mmol/L (ref 135–145)

## 2022-09-01 LAB — CULTURE, BLOOD (ROUTINE X 2)
Culture: NO GROWTH
Culture: NO GROWTH

## 2022-09-01 LAB — CBC
HCT: 25.3 % — ABNORMAL LOW (ref 36.0–46.0)
Hemoglobin: 8.3 g/dL — ABNORMAL LOW (ref 12.0–15.0)
MCH: 32.3 pg (ref 26.0–34.0)
MCHC: 32.8 g/dL (ref 30.0–36.0)
MCV: 98.4 fL (ref 80.0–100.0)
Platelets: 139 10*3/uL — ABNORMAL LOW (ref 150–400)
RBC: 2.57 MIL/uL — ABNORMAL LOW (ref 3.87–5.11)
RDW: 16.6 % — ABNORMAL HIGH (ref 11.5–15.5)
WBC: 5.5 10*3/uL (ref 4.0–10.5)
nRBC: 0 % (ref 0.0–0.2)

## 2022-09-01 LAB — URINE CULTURE

## 2022-09-01 LAB — HEMOGLOBIN A1C
Hgb A1c MFr Bld: 5.6 % (ref 4.8–5.6)
Mean Plasma Glucose: 114 mg/dL

## 2022-09-01 LAB — TYPE AND SCREEN

## 2022-09-01 SURGERY — COLON RESECTION LAPAROSCOPIC
Anesthesia: General | Site: Bladder

## 2022-09-01 MED ORDER — ALBUMIN HUMAN 5 % IV SOLN
INTRAVENOUS | Status: AC
  Filled 2022-09-01: qty 250

## 2022-09-01 MED ORDER — METOPROLOL TARTRATE 5 MG/5ML IV SOLN
INTRAVENOUS | Status: DC | PRN
  Administered 2022-09-01 (×2): 2.5 mg via INTRAVENOUS

## 2022-09-01 MED ORDER — SUGAMMADEX SODIUM 200 MG/2ML IV SOLN
INTRAVENOUS | Status: DC | PRN
  Administered 2022-09-01: 150 mg via INTRAVENOUS

## 2022-09-01 MED ORDER — ONDANSETRON HCL 4 MG/2ML IJ SOLN
INTRAMUSCULAR | Status: AC
  Filled 2022-09-01: qty 2

## 2022-09-01 MED ORDER — ROCURONIUM BROMIDE 10 MG/ML (PF) SYRINGE
PREFILLED_SYRINGE | INTRAVENOUS | Status: AC
  Filled 2022-09-01: qty 10

## 2022-09-01 MED ORDER — PROPOFOL 10 MG/ML IV BOLUS
INTRAVENOUS | Status: AC
  Filled 2022-09-01: qty 20

## 2022-09-01 MED ORDER — METOPROLOL TARTRATE 5 MG/5ML IV SOLN
INTRAVENOUS | Status: AC
  Filled 2022-09-01: qty 5

## 2022-09-01 MED ORDER — PROPOFOL 1000 MG/100ML IV EMUL
INTRAVENOUS | Status: AC
  Filled 2022-09-01: qty 100

## 2022-09-01 MED ORDER — CALCIUM GLUCONATE-NACL 2-0.675 GM/100ML-% IV SOLN
2.0000 g | Freq: Once | INTRAVENOUS | Status: AC
  Administered 2022-09-01: 2000 mg via INTRAVENOUS
  Filled 2022-09-01: qty 100

## 2022-09-01 MED ORDER — PROPOFOL 500 MG/50ML IV EMUL
INTRAVENOUS | Status: AC
  Filled 2022-09-01: qty 50

## 2022-09-01 MED ORDER — FENTANYL CITRATE PF 50 MCG/ML IJ SOSY: 25.0000 ug | PREFILLED_SYRINGE | INTRAMUSCULAR | Status: DC | PRN

## 2022-09-01 MED ORDER — ONDANSETRON HCL 4 MG/2ML IJ SOLN: 4.0000 mg | Freq: Once | INTRAMUSCULAR | Status: DC | PRN

## 2022-09-01 MED ORDER — PROCHLORPERAZINE EDISYLATE 10 MG/2ML IJ SOLN
10.0000 mg | INTRAMUSCULAR | Status: DC | PRN
  Administered 2022-09-01 – 2022-09-03 (×3): 10 mg via INTRAVENOUS
  Filled 2022-09-01 (×3): qty 2

## 2022-09-01 MED ORDER — HYDRALAZINE HCL 20 MG/ML IJ SOLN
INTRAMUSCULAR | Status: AC
  Filled 2022-09-01: qty 1

## 2022-09-01 MED ORDER — LACTATED RINGERS IR SOLN
Status: DC | PRN
  Administered 2022-09-01: 1000 mL

## 2022-09-01 MED ORDER — LINEZOLID 600 MG PO TABS
600.0000 mg | ORAL_TABLET | Freq: Two times a day (BID) | ORAL | Status: AC
  Administered 2022-09-01 – 2022-09-04 (×6): 600 mg via ORAL
  Filled 2022-09-01 (×6): qty 1

## 2022-09-01 MED ORDER — ENOXAPARIN SODIUM 40 MG/0.4ML IJ SOSY
40.0000 mg | PREFILLED_SYRINGE | INTRAMUSCULAR | Status: DC
  Administered 2022-09-02 – 2022-09-08 (×7): 40 mg via SUBCUTANEOUS
  Filled 2022-09-01 (×7): qty 0.4

## 2022-09-01 MED ORDER — ROCURONIUM BROMIDE 10 MG/ML (PF) SYRINGE
PREFILLED_SYRINGE | INTRAVENOUS | Status: DC | PRN
  Administered 2022-09-01 (×2): 20 mg via INTRAVENOUS
  Administered 2022-09-01: 40 mg via INTRAVENOUS
  Administered 2022-09-01: 60 mg via INTRAVENOUS

## 2022-09-01 MED ORDER — PROPOFOL 500 MG/50ML IV EMUL
INTRAVENOUS | Status: DC | PRN
  Administered 2022-09-01: 100 ug/kg/min via INTRAVENOUS

## 2022-09-01 MED ORDER — FENTANYL CITRATE (PF) 100 MCG/2ML IJ SOLN
INTRAMUSCULAR | Status: AC
  Filled 2022-09-01: qty 2

## 2022-09-01 MED ORDER — ALBUMIN HUMAN 5 % IV SOLN: INTRAVENOUS | Status: DC | PRN

## 2022-09-01 MED ORDER — SODIUM CHLORIDE 0.9 % IR SOLN
Status: DC | PRN
  Administered 2022-09-01: 2000 mL

## 2022-09-01 MED ORDER — LIDOCAINE HCL (PF) 2 % IJ SOLN
INTRAMUSCULAR | Status: AC
  Filled 2022-09-01: qty 5

## 2022-09-01 MED ORDER — OXYCODONE HCL 5 MG PO TABS
5.0000 mg | ORAL_TABLET | ORAL | Status: DC | PRN
  Administered 2022-09-02: 10 mg via ORAL
  Administered 2022-09-02: 5 mg via ORAL
  Administered 2022-09-03 – 2022-09-04 (×3): 10 mg via ORAL
  Administered 2022-09-04: 5 mg via ORAL
  Administered 2022-09-04 – 2022-09-05 (×3): 10 mg via ORAL
  Filled 2022-09-01 (×6): qty 2
  Filled 2022-09-01: qty 1
  Filled 2022-09-01: qty 2
  Filled 2022-09-01: qty 1

## 2022-09-01 MED ORDER — POTASSIUM CHLORIDE 10 MEQ/100ML IV SOLN
10.0000 meq | INTRAVENOUS | Status: AC
  Administered 2022-09-01 (×2): 10 meq via INTRAVENOUS
  Filled 2022-09-01 (×2): qty 100

## 2022-09-01 MED ORDER — DEXAMETHASONE SODIUM PHOSPHATE 4 MG/ML IJ SOLN
INTRAMUSCULAR | Status: DC | PRN
  Administered 2022-09-01: 8 mg via INTRAVENOUS

## 2022-09-01 MED ORDER — LIDOCAINE HCL (PF) 2 % IJ SOLN: INTRAMUSCULAR | Status: DC | PRN

## 2022-09-01 MED ORDER — FENTANYL CITRATE (PF) 100 MCG/2ML IJ SOLN
INTRAMUSCULAR | Status: DC | PRN
  Administered 2022-09-01 (×2): 50 ug via INTRAVENOUS
  Administered 2022-09-01: 150 ug via INTRAVENOUS
  Administered 2022-09-01: 50 ug via INTRAVENOUS

## 2022-09-01 MED ORDER — METHYLENE BLUE 1 % INJ SOLN
INTRAVENOUS | Status: AC
  Filled 2022-09-01: qty 10

## 2022-09-01 MED ORDER — 0.9 % SODIUM CHLORIDE (POUR BTL) OPTIME
TOPICAL | Status: DC | PRN
  Administered 2022-09-01 (×3): 1000 mL
  Administered 2022-09-01: 2000 mL

## 2022-09-01 MED ORDER — LIDOCAINE 2% (20 MG/ML) 5 ML SYRINGE
INTRAMUSCULAR | Status: DC | PRN
  Administered 2022-09-01: 60 mg via INTRAVENOUS
  Administered 2022-09-01: 1.5 mg/kg/h via INTRAVENOUS

## 2022-09-01 MED ORDER — POTASSIUM CHLORIDE 20 MEQ PO PACK
60.0000 meq | PACK | Freq: Once | ORAL | Status: AC
  Administered 2022-09-01: 60 meq via ORAL
  Filled 2022-09-01: qty 3

## 2022-09-01 MED ORDER — DEXAMETHASONE SODIUM PHOSPHATE 10 MG/ML IJ SOLN
INTRAMUSCULAR | Status: AC
  Filled 2022-09-01: qty 1

## 2022-09-01 MED ORDER — FENTANYL CITRATE (PF) 250 MCG/5ML IJ SOLN
INTRAMUSCULAR | Status: AC
  Filled 2022-09-01: qty 5

## 2022-09-01 MED ORDER — BUPIVACAINE LIPOSOME 1.3 % IJ SUSP
INTRAMUSCULAR | Status: AC
  Filled 2022-09-01: qty 20

## 2022-09-01 MED ORDER — LACTATED RINGERS IV SOLN: INTRAVENOUS | Status: DC

## 2022-09-01 MED ORDER — ONDANSETRON HCL 4 MG/2ML IJ SOLN
INTRAMUSCULAR | Status: DC | PRN
  Administered 2022-09-01: 4 mg via INTRAVENOUS

## 2022-09-01 MED ORDER — PHENYLEPHRINE 80 MCG/ML (10ML) SYRINGE FOR IV PUSH (FOR BLOOD PRESSURE SUPPORT)
PREFILLED_SYRINGE | INTRAVENOUS | Status: DC | PRN
  Administered 2022-09-01 (×3): 80 ug via INTRAVENOUS

## 2022-09-01 MED ORDER — AMISULPRIDE (ANTIEMETIC) 5 MG/2ML IV SOLN: 10.0000 mg | Freq: Once | INTRAVENOUS | Status: DC | PRN

## 2022-09-01 MED ORDER — ESMOLOL HCL 100 MG/10ML IV SOLN
INTRAVENOUS | Status: DC | PRN
  Administered 2022-09-01: 20 mg via INTRAVENOUS

## 2022-09-01 MED ORDER — BUPIVACAINE LIPOSOME 1.3 % IJ SUSP
INTRAMUSCULAR | Status: DC | PRN
  Administered 2022-09-01: 50 mL

## 2022-09-01 MED ORDER — PROPOFOL 10 MG/ML IV BOLUS
INTRAVENOUS | Status: DC | PRN
  Administered 2022-09-01: 20 mg via INTRAVENOUS
  Administered 2022-09-01: 100 mg via INTRAVENOUS

## 2022-09-01 SURGICAL SUPPLY — 93 items
APL PRP STRL LF DISP 70% ISPRP (MISCELLANEOUS) ×3
APL SKNCLS STERI-STRIP NONHPOA (GAUZE/BANDAGES/DRESSINGS)
APPLIER CLIP 5 13 M/L LIGAMAX5 (MISCELLANEOUS)
APPLIER CLIP ROT 10 11.4 M/L (STAPLE)
APR CLP MED LRG 11.4X10 (STAPLE)
APR CLP MED LRG 5 ANG JAW (MISCELLANEOUS)
BAG COUNTER SPONGE SURGICOUNT (BAG) IMPLANT
BAG SPNG CNTER NS LX DISP (BAG)
BAG URO CATCHER STRL LF (MISCELLANEOUS) ×4 IMPLANT
BENZOIN TINCTURE PRP APPL 2/3 (GAUZE/BANDAGES/DRESSINGS) IMPLANT
BLADE EXTENDED COATED 6.5IN (ELECTRODE) IMPLANT
BNDG ADH 1X3 SHEER STRL LF (GAUZE/BANDAGES/DRESSINGS) IMPLANT
BNDG ADH THN 3X1 STRL LF (GAUZE/BANDAGES/DRESSINGS)
CABLE HIGH FREQUENCY MONO STRZ (ELECTRODE) IMPLANT
CATH URETL OPEN END 6FR 70 (CATHETERS) IMPLANT
CELLS DAT CNTRL 66122 CELL SVR (MISCELLANEOUS) IMPLANT
CHLORAPREP W/TINT 26 (MISCELLANEOUS) ×4 IMPLANT
CLIP APPLIE 5 13 M/L LIGAMAX5 (MISCELLANEOUS) IMPLANT
CLIP APPLIE ROT 10 11.4 M/L (STAPLE) IMPLANT
CLOTH BEACON ORANGE TIMEOUT ST (SAFETY) ×4 IMPLANT
COVER SURGICAL LIGHT HANDLE (MISCELLANEOUS) ×4 IMPLANT
DRAIN CHANNEL 19F RND (DRAIN) IMPLANT
DRSG OPSITE POSTOP 4X10 (GAUZE/BANDAGES/DRESSINGS) IMPLANT
DRSG OPSITE POSTOP 4X6 (GAUZE/BANDAGES/DRESSINGS) IMPLANT
DRSG OPSITE POSTOP 4X8 (GAUZE/BANDAGES/DRESSINGS) IMPLANT
DRSG TEGADERM 2-3/8X2-3/4 SM (GAUZE/BANDAGES/DRESSINGS) IMPLANT
DRSG TEGADERM 4X4.75 (GAUZE/BANDAGES/DRESSINGS) IMPLANT
ELECT REM PT RETURN 15FT ADLT (MISCELLANEOUS) ×4 IMPLANT
ENDOLOOP SUT PDS II  0 18 (SUTURE)
ENDOLOOP SUT PDS II 0 18 (SUTURE) IMPLANT
GAUZE SPONGE 2X2 8PLY STRL LF (GAUZE/BANDAGES/DRESSINGS) IMPLANT
GAUZE SPONGE 4X4 12PLY STRL (GAUZE/BANDAGES/DRESSINGS) IMPLANT
GLOVE BIOGEL PI IND STRL 7.0 (GLOVE) ×8 IMPLANT
GLOVE SURG LX STRL 7.5 STRW (GLOVE) ×4 IMPLANT
GLOVE SURG SS PI 7.0 STRL IVOR (GLOVE) ×8 IMPLANT
GOWN STRL REUS W/ TWL LRG LVL3 (GOWN DISPOSABLE) ×8 IMPLANT
GOWN STRL REUS W/ TWL XL LVL3 (GOWN DISPOSABLE) ×4 IMPLANT
GOWN STRL REUS W/TWL LRG LVL3 (GOWN DISPOSABLE) ×6
GOWN STRL REUS W/TWL XL LVL3 (GOWN DISPOSABLE) ×3
GUIDEWIRE ANG ZIPWIRE 038X150 (WIRE) IMPLANT
GUIDEWIRE STR DUAL SENSOR (WIRE) ×4 IMPLANT
GUIDEWIRE ZIPWRE .038 STRAIGHT (WIRE) IMPLANT
IRRIG SUCT STRYKERFLOW 2 WTIP (MISCELLANEOUS) ×3
IRRIGATION SUCT STRKRFLW 2 WTP (MISCELLANEOUS) ×4 IMPLANT
KIT TURNOVER KIT A (KITS) IMPLANT
LIGASURE IMPACT 36 18CM CVD LR (INSTRUMENTS) IMPLANT
MANIFOLD NEPTUNE II (INSTRUMENTS) ×4 IMPLANT
PACK COLON (CUSTOM PROCEDURE TRAY) ×4 IMPLANT
PACK CYSTO (CUSTOM PROCEDURE TRAY) ×4 IMPLANT
PAD POSITIONING PINK XL (MISCELLANEOUS) IMPLANT
PENCIL SMOKE EVACUATOR (MISCELLANEOUS) ×4 IMPLANT
PROTECTOR NERVE ULNAR (MISCELLANEOUS) IMPLANT
RELOAD BL CONTOUR (ENDOMECHANICALS) ×3 IMPLANT
RELOAD STAPLE 40 BLU REG (ENDOMECHANICALS) IMPLANT
RELOAD STAPLE 60 2.6 WHT THN (STAPLE) IMPLANT
RELOAD STAPLER WHITE 60MM (STAPLE) IMPLANT
RETRACTOR WND ALEXIS 18 MED (MISCELLANEOUS) IMPLANT
RTRCTR WOUND ALEXIS 18CM MED (MISCELLANEOUS)
SCISSORS LAP 5X35 DISP (ENDOMECHANICALS) ×4 IMPLANT
SET TUBE SMOKE EVAC HIGH FLOW (TUBING) ×4 IMPLANT
SHEARS HARMONIC ACE PLUS 36CM (ENDOMECHANICALS) ×4 IMPLANT
SLEEVE Z-THREAD 5X100MM (TROCAR) ×8 IMPLANT
SPIKE FLUID TRANSFER (MISCELLANEOUS) ×4 IMPLANT
STAPLER CVD CUT BL 40 RELOAD (ENDOMECHANICALS) ×3 IMPLANT
STAPLER CVD CUT BLU 40 RELOAD (ENDOMECHANICALS) IMPLANT
STAPLER ECHELON LONG 60 440 (INSTRUMENTS) IMPLANT
STAPLER RELOAD WHITE 60MM (STAPLE)
STAPLER VISISTAT 35W (STAPLE) IMPLANT
STENT URET 6FRX24 CONTOUR (STENTS) IMPLANT
STENT URET 6FRX26 CONTOUR (STENTS) IMPLANT
STRIP CLOSURE SKIN 1/2X4 (GAUZE/BANDAGES/DRESSINGS) IMPLANT
SUT PDS AB 0 CT1 36 (SUTURE) IMPLANT
SUT PROLENE 2 0 KS (SUTURE) IMPLANT
SUT SILK 2 0 (SUTURE) ×3
SUT SILK 2 0 SH CR/8 (SUTURE) ×4 IMPLANT
SUT SILK 2-0 18XBRD TIE 12 (SUTURE) ×4 IMPLANT
SUT SILK 3 0 (SUTURE) ×3
SUT SILK 3 0 SH CR/8 (SUTURE) ×4 IMPLANT
SUT SILK 3-0 18XBRD TIE 12 (SUTURE) ×4 IMPLANT
SUT VIC AB 2-0 SH 27 (SUTURE) ×6
SUT VIC AB 2-0 SH 27X BRD (SUTURE) ×4 IMPLANT
SUT VIC AB 3-0 SH 27 (SUTURE) ×3
SUT VIC AB 3-0 SH 27XBRD (SUTURE) IMPLANT
SUT VICRYL 0 ENDOLOOP (SUTURE) IMPLANT
SYS LAPSCP GELPORT 120MM (MISCELLANEOUS)
SYSTEM LAPSCP GELPORT 120MM (MISCELLANEOUS) IMPLANT
TAPE CLOTH 4X10 WHT NS (GAUZE/BANDAGES/DRESSINGS) IMPLANT
TOWEL OR NON WOVEN STRL DISP B (DISPOSABLE) ×4 IMPLANT
TRAY FOLEY MTR SLVR 16FR STAT (SET/KITS/TRAYS/PACK) IMPLANT
TROCAR ADV FIXATION 12X100MM (TROCAR) ×4 IMPLANT
TROCAR Z-THREAD OPTICAL 5X100M (TROCAR) ×4 IMPLANT
TUBING CONNECTING 10 (TUBING) ×4 IMPLANT
TUBING UROLOGY SET (TUBING) IMPLANT

## 2022-09-01 NOTE — Progress Notes (Addendum)
Urology Progress Note    Subjective: Pt in good spirits this am. She was with her daughter, who is a family medicine PA  Objective: Vital signs in last 24 hours: Temp:  [97.6 F (36.4 C)-98.2 F (36.8 C)] 98.2 F (36.8 C) (04/24 0556) Pulse Rate:  [76-95] 89 (04/24 0556) Resp:  [17-19] 19 (04/24 0556) BP: (141-184)/(80-92) 168/80 (04/24 0556) SpO2:  [99 %-100 %] 99 % (04/24 0556)  Intake/Output from previous day: 04/23 0701 - 04/24 0700 In: -  Out: 3300 [Urine:3300] Intake/Output this shift: No intake/output data recorded.  Physical Exam:  General: Alert and oriented CV: Regular rate Lungs: Normal work of breathing on room air Abdomen: Soft, nontender, nondistended GU: Foley catheter in place to gravity drainage. Urine draining is yellow with particulate matter in tubing Ext: NT, No erythema  Lab Results: Recent Labs    08/30/22 0526 08/31/22 0245 09/01/22 0316  HGB 8.1* 7.8* 8.3*  HCT 24.9* 23.9* 25.3*   BMET Recent Labs    08/31/22 0245 09/01/22 0316  NA 137 135  K 3.9 2.7*  CL 103 106  CO2 26 21*  GLUCOSE 95 120*  BUN 8 6*  CREATININE 0.76 0.66  CALCIUM 7.4* 6.7*     Studies/Results: No results found.  Assessment/Plan:  77 y.o. female admitted with pneumaturia and dysuria with colovesical fistula on recent CT imaging.  - Urology to accompany general surgery to the OR for placement of ureteral marking stents and assist with bladder closure.  - Foley catheter to remain in place post operatively with cyctogram prior to removal.  -Will continue to follow      LOS: 5 days   CAMERON W SATTENFIELD 09/01/2022, 10:22 AM  I have seen and examined the patient and agree with the above assessment and plan.  Will plan for indwelling right ureteral stent considering right hydronephrosis and solitary functional kidney and a left open ended stent for intraoperative purposes.  I will be available for bladder repair as well.

## 2022-09-01 NOTE — Progress Notes (Signed)
PROGRESS NOTE    April Weber  ZOX:[id number] DOB: 18-Aug-1945 DOA: 08/27/2022 PCP: Raliegh Ip, DO   Brief Narrative:  April Weber is a 77 y.o. female with medical history significant of osteoarthritis, right breast cancer, stage IIIa CKD,, single kidney, history of DVT, dyspnea, hyperlipidemia, hypertension, osteopenia, peripheral vascular disease, seasonal allergies, hypothyroidism who presented to the emergency department complaints of decreased appetite, nausea, lower abdominal pain, constipation and low-grade fever for the past few days.    Patient has known colovesicular and colovaginal fistula discovered earlier this month after recurrent infections.  CT abdomen pelvis without contrast done just 4 days prior to admission shows increased large volume of gas within the bladder.  Of note she just recently completed chemotherapy the first week of April.  As such urology surgery and oncology have been called in consult.  Hospitalist has been called for admission.  Assessment & Plan:   Principal Problem:   Colovesical fistula Active Problems:   Hyperlipidemia   HTN (hypertension)   Hypothyroidism   Stage 3a chronic kidney disease   Hyponatremia   Right ovarian epithelial cancer   Sepsis secondary to subacute vs chronic colovesical/colovaginal fistulas, POA -Fistulas appear to be well-known to the hospital system, repeat imaging last night demonstrates increased inflammation and pelvic fluid from earlier this week. -Urology and general surgery have been consulted. -Continue broad-spectrum antibiotics(ceftriaxone, Flagyl) -Urine culture growing staph hemolyticus as well as Enterococcus, and linezolid x 3 days continue Macrobid -Plan for surgical intervention 09/01/2022 -possible two-stage versus single-stage surgical intervention with repair of fistula and possible ostomy with later reanastomosis.  Bowel prep today ongoing n.p.o. at midnight.   Hyponatremia,  hypovolemic -Continue IV fluids, urinalysis markedly abnormal given fistula/contamination   Acute on chronic anemia of chronic disease  -Baseline anemia down overnight likely secondary to worsening infection and poor p.o. intake -Rule out acute bleed -none noted per stool or urine, CT abdomen pelvis without overt intra-abdominal bleeding -Status post transfusion, repeat hemoglobin within normal limits  Stage 3a chronic kidney disease -Renal function appears to be within normal limits, will have patient follow-up with PCP to further classify her CKD staging   Hyperlipidemia -Low-salt low-fat diet, resume statin once able to take p.o. safely   HTN (hypertension) - Home atenolol on hold, previously normotensive in setting of above   Hypothyroidism Resume home thyroid supplementation  Right ovarian epithelial cancer Follow-up with Dr. Bertis Ruddy as scheduled. Status post chemotherapy first week of April  DVT prophylaxis: None given worsening anemia concern for possible bleed Code Status: Full Family Communication: Daughter updated over the phone  Status is: Inpatient  Dispo: The patient is from: Home              Anticipated d/c is to: To be determined              Anticipated d/c date is: To be determined -expect prolonged hospitalization              Patient currently not medically stable for discharge  Consultants:  Urology, general surgery, oncology  Procedures:  Tentative two-stage procedure as outlined by surgery above  Antimicrobials:  Ceftriaxone, Flagyl  Subjective: No acute issues or events overnight, patient tolerated bowel prep quite well, somewhat nervous about procedure today but excited to be moving forward.  Denies vomiting diarrhea constipation headache fevers chills or chest pain -but does report ongoing nausea. Objective: Vitals:   08/31/22 1329 08/31/22 1428 08/31/22 1939 09/01/22 0556  BP: (!) 181/87 Marland Kitchen)  184/91 (!) 141/92 (!) 168/80  Pulse: 76  95 89   Resp: Temp: 97.9 F (36.6 C)  97.6 F (36.4 C) 98.2 F (36.8 C)  TempSrc: Oral     SpO2: 100%  100% 99%  Weight:      Height:        Intake/Output Summary (Last 24 hours) at 09/01/2022 0826 Last data filed at 09/01/2022 0500 Gross per 24 hour  Intake --  Output 3300 ml  Net -3300 ml    Filed Weights   08/27/22 0743  Weight: 71.7 kg    Examination:  General:  Pleasantly resting in bed, No acute distress. HEENT:  Normocephalic atraumatic.  Sclerae nonicteric, noninjected.  Extraocular movements intact bilaterally. Neck:  Without mass or deformity.  Trachea is midline. Lungs:  Clear to auscultate bilaterally without rhonchi, wheeze, or rales. Heart:  Regular rate and rhythm.  Without murmurs, rubs, or gallops. Abdomen:  Soft, nontender, nondistended.  Without guarding or rebound. GI/GU: Urine at bedside bag noted to be feculent/brown  Data Reviewed: I have personally reviewed following labs and imaging studies  CBC: Recent Labs  Lab 08/26/22 1207 08/26/22 1207 08/27/22 0823 08/27/22 2335 08/28/22 0100 08/28/22 1200 08/29/22 0959 08/30/22 0526 08/31/22 0245 09/01/22 0316  WBC 6.1   < > 5.4  --  4.9  --  3.9* 3.3* 2.2* 5.5  NEUTROABS 4.9  --  4.4  --   --   --   --   --   --   --   HGB 7.9*  --  7.2*   < > 6.8* 7.4* 7.3* 8.1* 7.8* 8.3*  HCT 23.9*  --  21.8*   < > 20.4* 22.8* 21.9* 24.9* 23.9* 25.3*  MCV 98.0  --  99.1  --  100.0  --  97.3 97.6 99.2 98.4  PLT 180   < > 133*  --  115*  --  107* 105* 105* 139*   < > = values in this interval not displayed.    Basic Metabolic Panel: Recent Labs  Lab 08/28/22 0100 08/29/22 0959 08/30/22 0526 08/31/22 0245 09/01/22 0316  NA 128* 131* 134* 137 135  K 3.8 3.4* 3.5 3.9 2.7*  CL 98 101 98 103 106  CO2 21*  GLUCOSE 105* 100* 93 95 120*  BUN 12 6* 8 8 6*  CREATININE 0.97 0.89 0.77 0.76 0.66  CALCIUM 8.1* 7.7* 7.7* 7.4* 6.7*    GFR: Estimated Creatinine Clearance: 59.8 mL/min (by C-G  formula based on SCr of 0.66 mg/dL). Liver Function Tests: Recent Labs  Lab 08/28/22 0100  AST 13*  ALT 12  ALKPHOS 35*  BILITOT 0.5  PROT 5.4*  ALBUMIN 2.5*    No results for input(s): "LIPASE", "AMYLASE" in the last 168 hours.  Sepsis Labs: Recent Labs  Lab 08/27/22 1020 08/27/22 1430  LATICACIDVEN 0.6 0.8     Recent Results (from the past 240 hour(s))  Urine Culture     Status: Abnormal   Collection Time: 08/23/22 10:35 PM   Specimen: Urine, Clean Catch  Result Value Ref Range Status   Specimen Description   Final    URINE, CLEAN CATCH Performed at [facility], [address] [place], [place] [address]    Special Requests   Final    NONE Performed at [facility], [address], [place], [place] [address]    Culture (A)  Final    <10,000 COLONIES/mL INSIGNIFICANT  GROWTH Performed at [facility], [address], [place], [place] [address]    Report Status 08/25/2022 FINAL  Final  Culture, blood (Routine X 2) w Reflex to ID Panel     Status: None   Collection Time: 08/27/22 11:00 AM   Specimen: BLOOD  Result Value Ref Range Status   Specimen Description   Final    BLOOD SITE NOT SPECIFIED Performed at [facility], [address] [place], [place] [address]    Special Requests   Final    BOTTLES DRAWN AEROBIC AND ANAEROBIC Blood Culture results may not be optimal due to an excessive volume of blood received in culture bottles Performed at [facility], [address], [place], [place] [address]    Culture   Final    NO GROWTH 5 DAYS Performed at [facility], [address] [place], [place] [address]    Report Status 09/01/2022 FINAL  Final  Culture, blood (Routine X 2) w Reflex to ID Panel     Status: None   Collection Time: 08/27/22 11:00 AM   Specimen: BLOOD  Result Value Ref Range Status   Specimen Description   Final    BLOOD BLOOD LEFT WRIST Performed  at [facility], [address] [place], [place] [address]    Special Requests   Final    BOTTLES DRAWN AEROBIC AND ANAEROBIC Blood Culture results may not be optimal due to an excessive volume of blood received in culture bottles Performed at [facility], [address] [place], [place] [address]    Culture   Final    NO GROWTH 5 DAYS Performed at [facility], [address] [place], [place] [address]    Report Status 09/01/2022 FINAL  Final  Urine Culture     Status: Abnormal   Collection Time: 08/27/22  4:39 PM   Specimen: Urine, Random  Result Value Ref Range Status   Specimen Description   Final    URINE, RANDOM Performed at [facility], [address], [place], [place] [address]    Special Requests   Final    NONE Reflexed from [id number] Performed at [facility], [address] [place], [place] [address]    Culture (A)  Final    >=100,000 COLONIES/mL STAPHYLOCOCCUS HAEMOLYTICUS 20,000 COLONIES/mL ENTEROCOCCUS FAECIUM    Report Status 09/01/2022 FINAL  Final   Organism ID, Bacteria STAPHYLOCOCCUS HAEMOLYTICUS (A)  Final   Organism ID, Bacteria ENTEROCOCCUS FAECIUM (A)  Final      Susceptibility   Enterococcus faecium - MIC*    AMPICILLIN <=2 SENSITIVE Sensitive     NITROFURANTOIN 64 INTERMEDIATE Intermediate     VANCOMYCIN <=0.5 SENSITIVE Sensitive     * 20,000 COLONIES/mL ENTEROCOCCUS FAECIUM   Staphylococcus haemolyticus - MIC*    CIPROFLOXACIN >=8 RESISTANT Resistant     GENTAMICIN >=16 RESISTANT Resistant     NITROFURANTOIN <=16 SENSITIVE Sensitive     OXACILLIN >=4 RESISTANT Resistant     TETRACYCLINE 2 SENSITIVE Sensitive     VANCOMYCIN 1 SENSITIVE Sensitive     TRIMETH/SULFA 160 RESISTANT Resistant     CLINDAMYCIN >=8 RESISTANT Resistant     RIFAMPIN >=32 RESISTANT Resistant     Inducible Clindamycin NEGATIVE Sensitive     * >=100,000 COLONIES/mL STAPHYLOCOCCUS  HAEMOLYTICUS         Radiology Studies: No results found.  Scheduled Meds:  acetaminophen  1,000 mg  Oral On Call to OR   alvimopan  12 mg Oral On Call to OR   bupivacaine liposome  20 mL Infiltration On Call to OR   chlorhexidine  1 Application Topical On Call to OR   Chlorhexidine Gluconate Cloth  6 each Topical Daily   feeding supplement  592 mL Oral Once   heparin  5,000 Units Subcutaneous On Call to OR   liver oil-zinc oxide   Topical Q6H   nitrofurantoin (macrocrystal-monohydrate)  100 mg Oral Q12H   oxyCODONE-acetaminophen  1 tablet Oral Q8H   sodium chloride flush  10-40 mL Intracatheter Q12H   Continuous Infusions:  calcium gluconate     cefoTEtan (CEFOTAN) IV     cefTRIAXone (ROCEPHIN)  IV 2 g (08/31/22 2109)   lactated ringers 75 mL/hr at 08/31/22 1952   metronidazole 500 mg (08/31/22 2244)   potassium chloride       LOS: 5 days   Time spent:  Azucena Fallen, DO [facility]  If 7PM-7AM, please contact night-coverage [email or web address]  09/01/2022, 8:26 AM

## 2022-09-01 NOTE — Op Note (Addendum)
Preoperative diagnosis: 1.  Colovesical fistula 2.  Atrophic left kidney 3.  Right hydronephrosis  Postoperative diagnosis: 1.  Colovesical fistula 2.  Atrophic left kidney 3.  Right hydronephrosis  Procedures: 1.  Cystoscopy 2.  Bilateral retrograde pyelography with interpretation 3.  Bilateral ureteral stent placement (right -6 x 24, left -6 x 26) 4.  Repair of bladder fistula  Surgeon: April Bruins MD  Anesthesia: General  Complications: None  Intraoperative findings: Right retrograde pyelography indicated a normal caliber ureter without filling defects.  There was noted to be mild hydronephrosis of the right renal collecting system potentially consistent with a UPJ obstruction.  No renal collecting system filling defects were noted.  On the left side, there was noted to be significant tortuosity and possible stricture of the lower portion of the mid left ureter.  There was mild dilation of the proximal ureter.  There was noted to be an obvious colovesical fistula in the posterior left bladder.  Specimens: None  Indication: April Weber is a 77 year old female with diverticular disease who developed a colovesical fistula.  [name]  As such, urologic consultation was obtained to place preoperative ureteral stents for help with identification of the ureters intraoperatively and for potential help with bladder repair during the procedure.  The potential risks, complications, and expected recovery process were discussed in detail.  Considering the fact that the patient had a functional solitary right kidney and evidence of hydronephrosis, although her renal function was normal, it was felt that an indwelling double-J ureteral stent would be indicated.  Informed consent was obtained.  Description of procedure: The patient taken the operating room and general anesthetic was administered.  She was given preoperative antibiotics,  placed in the dorsolithotomy position, and prepped and draped in usual sterile fashion.  Next, a preoperative timeout was performed.  Cystourethroscopy was performed which revealed an obvious fistulous communication with stool in the fistula located in the posterior left bladder.  There was significant debris/feces within the bladder.  This was evacuated.  The right ureteral orifice was then identified and intubated with a 6 French ureteral catheter.  Omnipaque contrast was injected with findings as dictated above.  A 0.38 sensor guidewire was then advanced up the right ureter into the renal collecting system under fluoroscopic guidance.  A 6 x 24 double-J ureteral stent was then advanced over the wire using Seldinger technique and position appropriately fluoroscopic and cystoscopic guidance.  The wire was removed with a good cone of the renal pelvis as well as within the bladder.  Attention then turned to the left ureteral orifice.  This was intubated with a 6 French ureteral catheter.  Omnipaque contrast was injected.  There was noted to be a possible stricture versus tortuosity of the lower mid left ureter.  An attempt to place a 0.38 guidewire met resistance at this area.  The wire was therefore left in place and a 6 French semirigid ureteroscope was used to identify and evaluate this area.  It appeared that there was no significant stricture but there was tortuosity of the ureter.  The wire was then repositioned and was able to be advanced up into the renal collecting system of the atrophic kidney.  It was felt that a double-J ureteral stent would also be indicated on this side based on the findings in the mid ureter.  A 6 x 26 double-J ureteral stent was advanced over the wire using Seldinger technique.  It was  positioned appropriately fluoroscopic and cystoscopic guidance.  The wire was removed with a good curl noted in the renal pelvis as well as in the bladder.  A 16 French nonlatex catheter was then  inserted.  At this point the case was turned over to Dr. Sheliah Hatch.  The patient was in stable condition.  Following resection of the diverticular phlegmon by Dr. Sheliah Hatch, the bladder was freed from the surrounding tissue and examined.  An anterior cystotomy was made with the electrocautery and the ureteral orifice ease were identified with the indwelling stents that have been placed.  There was noted to be a 1 cm fistulous communication with the bladder and the left posterior aspect of the bladder.  This was closed with a running 3-0 Vicryl suture on the mucosal layer with a 2-0 Vicryl suture on the outer peritoneal layer for a 2 layer closure.  The cystotomy was then closed with a 3-0 Vicryl mucosal layer and a 2-0 Vicryl muscular layer.  The bladder was then filled with methylene blue and there was no evidence of extravasation.  At this point the case was again turned over to Dr. Sheliah Hatch.

## 2022-09-01 NOTE — Progress Notes (Signed)
Initial Nutrition Assessment  DOCUMENTATION CODES:      INTERVENTION:  Monitor for nutrition plans-- TPN?   NUTRITION DIAGNOSIS:   Inadequate oral intake related to inability to eat as evidenced by NPO status.  GOAL:   Patient will meet greater than or equal to 90% of their needs  MONITOR:   PO intake, Supplement acceptance, Diet advancement, Labs, Weight trends, Skin, I & O's  REASON FOR ASSESSMENT:   Malnutrition Screening Tool    ASSESSMENT:   77 y.o. female with PMHx including osteoarthritis, R breast cancer, CKD 3a--single kidney, DVT, dyspnea, HLD, HTN, osteopenia, PVD, seasonal allergies, hypothyroidism who presents with c/o decreased appetite, nausea, lower abdominal pain, low grade fever for the past few days along with stool in her urine and stool like vaginal discharge since earlier this month.  Plans for colon resection and cystoscopy with ureteral catheter placement today   Labs:  K+ 2.7, Glu 120, hga1c 5.6 Meds: rocephin, flagyl, macrobid, flagyl, potassium chloride, NS Wt: no wt loss per chart review  PO: 100% meal intake x 2 documented meals (full liquid diet)  I/O's: -1.7 L   Patient not in room at time of visit- in OR    NUTRITION - FOCUSED PHYSICAL EXAM:  Patient off the floor at time of visit   Diet Order:   Diet Order             Diet NPO time specified Except for: Ice Chips, Sips with Meds  Diet effective ____                   EDUCATION NEEDS:   Not appropriate for education at this time  Skin:  Skin Assessment: Reviewed RN Assessment  Last BM:  4/24  Height:   Ht Readings from Last 1 Encounters:  08/27/22  (1.676 m)    Weight:   Wt Readings from Last 1 Encounters:  08/27/22 71.7 kg    BMI:  Body mass index is 25.5 kg/m.  Estimated Nutritional Needs:   Kcal:  1800-2150 kcal  Protein:  85-110 g  Fluid:  >/= 2L    Leodis Rains, RDN, LDN  Clinical Nutrition

## 2022-09-01 NOTE — Progress Notes (Signed)
Pre Procedure note for inpatients:   April Weber has been scheduled for Procedure(s): COLON RESECTION LAPAROSCOPIC (N/A) CYSTOSCOPY WITH URETERAL CATHETER PLACEMENT today. The various methods of treatment have been discussed with the patient. After consideration of the risks, benefits and treatment options the patient has consented to the planned procedure.   The patient has been seen and labs reviewed. There are no changes in the patient's condition to prevent proceeding with the planned procedure today.  Recent labs:  Lab Results  Component Value Date   WBC 5.5 09/01/2022   HGB 8.3 (L) 09/01/2022   HCT 25.3 (L) 09/01/2022   PLT 139 (L) 09/01/2022   GLUCOSE 120 (H) 09/01/2022   CHOL 146 06/01/2022   TRIG 104 06/01/2022   HDL 40 06/01/2022   LDLCALC 87 06/01/2022   ALT 12 08/28/2022   AST 13 (L) 08/28/2022   NA 135 09/01/2022   K 2.7 (LL) 09/01/2022   CL 106 09/01/2022   CREATININE 0.66 09/01/2022   BUN 6 (L) 09/01/2022   CO2 21 (L) 09/01/2022   TSH 4.240 06/17/2022   HGBA1C 5.6 08/31/2022    Rodman Pickle, MD 09/01/2022 9:46 AM

## 2022-09-01 NOTE — Anesthesia Preprocedure Evaluation (Addendum)
Anesthesia Evaluation  Patient identified by MRN, date of birth, ID band Patient awake    Reviewed: Allergy & Precautions, NPO status , Patient's Chart, lab work & pertinent test results  History of Anesthesia Complications (+) PONV and history of anesthetic complications  Airway Mallampati: II  TM Distance: >3 FB Neck ROM: Full    Dental  (+) Edentulous Upper, Missing   Pulmonary former smoker   Pulmonary exam normal        Cardiovascular hypertension, Pt. on medications and Pt. on home beta blockers + Peripheral Vascular Disease and + DVT  Normal cardiovascular exam     Neuro/Psych negative neurological ROS  negative psych ROS   GI/Hepatic negative GI ROS, Neg liver ROS,,,  Endo/Other  Hypothyroidism    Renal/GU negative Renal ROS     Musculoskeletal  (+) Arthritis ,    Abdominal   Peds  Hematology  (+) Blood dyscrasia, anemia   Anesthesia Other Findings BOWEL OBSTRUCTION  Reproductive/Obstetrics                             Anesthesia Physical Anesthesia Plan  ASA: 2  Anesthesia Plan: General   Post-op Pain Management:    Induction: Intravenous  PONV Risk Score and Plan: 4 or greater and Ondansetron, Dexamethasone, Propofol infusion and Treatment may vary due to age or medical condition  Airway Management Planned: Oral ETT  Additional Equipment:   Intra-op Plan:   Post-operative Plan: Extubation in OR  Informed Consent: I have reviewed the patients History and Physical, chart, labs and discussed the procedure including the risks, benefits and alternatives for the proposed anesthesia with the patient or authorized representative who has indicated his/her understanding and acceptance.     Dental advisory given  Plan Discussed with: CRNA  Anesthesia Plan Comments:        Anesthesia Quick Evaluation

## 2022-09-01 NOTE — Progress Notes (Deleted)
Febrile episode, temp 101.26F, prn tylenol given. Johann Capers, NP made aware.

## 2022-09-01 NOTE — Op Note (Signed)
Preoperative diagnosis: colovesical fistula  Postoperative diagnosis: same   Procedure: laparoscopic converted to open sigmoid resection with anastomosis, diverting loop ileostomy, take down of colovesical fistula  Surgeon: Feliciana Rossetti, M.D.  Asst: Carl Best, [facility]  Anesthesia: GETA  Indications for procedure: April Weber is a 77 y.o. year old female with symptoms of recurrent UTIs and abdominal pain.  Description of procedure: The patient was brought into the operative suite. Anesthesia was administered with General endotracheal anesthesia. WHO checklist was applied. The patient was then placed in lithotomy position. The area was prepped and draped in the usual sterile fashion.  Dr. Laverle Patter performed a cysto and plans ureteral stents prior to surgery.  Next, a left subcostal incision was made. A 5mm trocar was used to gain access to the peritoneal cavity by optical entry technique. Pneumoperitoneum was applied with a high flow and low pressure. The laparoscope was reinserted to confirm position.  3 additional trocars were placed 1 5 mm trocar in the right upper abdomen, 1 5 mm trocar in the infraumbilical space, and 1 12 mm trocar in the right lower quadrant.  There were no adhesions to the abdominal wall. The sigmoid colon was thickened and chronically inflamed. There was one loop of small intestine adhesed to the sigmoid colon, this was sharply dissected free. The White line of Toldt was incised. Blunt dissection was used to free up the colon from the bladder and abdominal wall. This was very difficult. During this dissection Dr. Melynda Keller came in to look at the abdomen and determined no evidence of cancer was present. After about 30 minutes of laparoscopic dissection, I did not think I could safely complete dissection laparoscopically and so a lower midline incision was made.  Next, palpation identified the ureters which were well away from the dissection. Blunt dissection was able  to free the colon completely from the bladder and side wall. Next, a blue load contour stapler was used to divide the distal sigmoid colon. The mesentery was taken with ligasure device. The left colon was fully mobilized a portion of colon proximal to the inflammatory changes was purse stringed and divided distally. A 29 mm anvil was placed into the colon and purse string tied down.   Next, Dr. Laverle Patter came in to repair the bladder. See his dictation for more information.  The rectum was mobilized more by freeing it from the side walls. At this point I went below to palpate the rectum, there was some difficulty to get the dilator to reach the staple line. There was an attempt to pass the stapler but this led to a tear in the distal rectum. Dr. Michaell Cowing came in at this time to assist and a further margin of sigmoid/rectum was taken with the contour stapler. This allowed the stapler to come to the end and anastomosis was performed. Leak test showed a small leak distal to the staple line that was repaired with interrupted 3-0 vicryl. Due to this leak and thin tissue at the area, decision was made to divert with loop ileostomy.  The 12 mm trocar site was enlarged and a portion of distal ileum brought through the hole. A 19 fr blake drain was brought through RUQ incision and tip placed into the pelvis. The abdomen was irrigated. Hemostasis was intact. The peritoneum was closed with 2-0 vicryl. The fascia was closed with 0 PDS. The ileostomy was matured with 3-0 and 2-3 cm of Brooking. The skin was closed with staples. Ileostomy bag was placed. All counts  were correct.  Findings: colovesical fistula, dense inflammation of the sigmoid colon  Specimen: sigmoid colon, suture marks distal  Implant: 19 fr blake drain in RUQ with tip sitting in pelvis   Blood loss: 150 ml  Local anesthesia:  50 ml Exparel:Marcaine Mix  Complications: none  Feliciana Rossetti, M.D. General, Bariatric, & Minimally Invasive  Surgery [facility]

## 2022-09-01 NOTE — Anesthesia Procedure Notes (Signed)
Procedure Name: Intubation Date/Time: 09/01/2022 12:07 PM  Performed by: Ludwig Lean, CRNAPre-anesthesia Checklist: Patient identified, Emergency Drugs available, Suction available and Patient being monitored Patient Re-evaluated:Patient Re-evaluated prior to induction Oxygen Delivery Method: Circle system utilized Preoxygenation: Pre-oxygenation with 100% oxygen Induction Type: IV induction Ventilation: Mask ventilation without difficulty Laryngoscope Size: Mac and 3 Grade View: Grade I Tube type: Oral Tube size: 7.0 mm Number of attempts: 1 Airway Equipment and Method: Stylet Placement Confirmation: ETT inserted through vocal cords under direct vision, positive ETCO2 and breath sounds checked- equal and bilateral Secured at: 19 cm Tube secured with: Tape Dental Injury: Teeth and Oropharynx as per pre-operative assessment

## 2022-09-01 NOTE — Transfer of Care (Signed)
Immediate Anesthesia Transfer of Care Note  Patient: April Weber  Procedure(s) Performed: LAPAROSCOPIC SIGMOIDECTOMY CONVERTED TO OPEN, TAKEDOWN OF COLOVESICAL FISTULA AND LOOP COLOSTOMY (Abdomen) CYSTOSCOPY WITH BILATERAL URETERAL STENT PLACEMENTS (Bladder) REPAIR BLADDER FISTULA WITH HYDRODISTENTION WITH METHYLINE BLUE (Abdomen)  Patient Location: PACU  Anesthesia Type:General  Level of Consciousness: awake, alert , and oriented  Airway & Oxygen Therapy: Patient Spontanous Breathing and Patient connected to face mask oxygen  Post-op Assessment: Report given to RN, Post -op Vital signs reviewed and stable, and Patient moving all extremities X 4  Post vital signs: Reviewed and stable  Last Vitals:  Vitals Value Taken Time  BP 172/82   Temp    Pulse 95 09/01/22 1615  Resp 13 09/01/22 1615  SpO2 100 % 09/01/22 1615  Vitals shown include unvalidated device data.  Last Pain:  Vitals:   09/01/22 0800  TempSrc:   PainSc: 0-No pain      Patients Stated Pain Goal: 2 (08/29/22 2030)  Complications: No notable events documented.

## 2022-09-02 ENCOUNTER — Encounter (HOSPITAL_COMMUNITY): Payer: Self-pay | Admitting: General Surgery

## 2022-09-02 ENCOUNTER — Telehealth: Payer: Self-pay | Admitting: Hematology and Oncology

## 2022-09-02 DIAGNOSIS — N321 Vesicointestinal fistula: Secondary | ICD-10-CM | POA: Diagnosis not present

## 2022-09-02 DIAGNOSIS — N1330 Unspecified hydronephrosis: Secondary | ICD-10-CM

## 2022-09-02 LAB — BASIC METABOLIC PANEL
Anion gap: 9 (ref 5–15)
BUN: 7 mg/dL — ABNORMAL LOW (ref 8–23)
CO2: 24 mmol/L (ref 22–32)
Calcium: 7.7 mg/dL — ABNORMAL LOW (ref 8.9–10.3)
Chloride: 100 mmol/L (ref 98–111)
Creatinine, Ser: 0.92 mg/dL (ref 0.44–1.00)
GFR, Estimated: >60 mL/min (ref >60)
Glucose, Bld: 159 mg/dL — ABNORMAL HIGH (ref 70–99)
Potassium: 4.3 mmol/L (ref 3.5–5.1)
Sodium: 133 mmol/L — ABNORMAL LOW (ref 135–145)

## 2022-09-02 LAB — TYPE AND SCREEN
ABO/RH(D): A POS
Antibody Screen: NEGATIVE

## 2022-09-02 LAB — CBC
HCT: 22.5 % — ABNORMAL LOW (ref 36.0–46.0)
Hemoglobin: 7.2 g/dL — ABNORMAL LOW (ref 12.0–15.0)
MCH: 32.1 pg (ref 26.0–34.0)
MCHC: 32 g/dL (ref 30.0–36.0)
MCV: 100.4 fL — ABNORMAL HIGH (ref 80.0–100.0)
Platelets: 149 10*3/uL — ABNORMAL LOW (ref 150–400)
RBC: 2.24 MIL/uL — ABNORMAL LOW (ref 3.87–5.11)
RDW: 16.6 % — ABNORMAL HIGH (ref 11.5–15.5)
WBC: 10.3 10*3/uL (ref 4.0–10.5)
nRBC: 0 % (ref 0.0–0.2)

## 2022-09-02 LAB — PREPARE RBC (CROSSMATCH)

## 2022-09-02 MED ORDER — ACETAMINOPHEN 325 MG PO TABS
650.0000 mg | ORAL_TABLET | Freq: Once | ORAL | Status: AC
  Administered 2022-09-02: 650 mg via ORAL
  Filled 2022-09-02: qty 2

## 2022-09-02 MED ORDER — BOOST / RESOURCE BREEZE PO LIQD CUSTOM
1.0000 | Freq: Three times a day (TID) | ORAL | Status: DC
  Administered 2022-09-02: 1 via ORAL

## 2022-09-02 MED ORDER — SODIUM CHLORIDE 0.9% IV SOLUTION: Freq: Once | INTRAVENOUS | Status: AC

## 2022-09-02 NOTE — Progress Notes (Addendum)
Urology Progress Note    Subjective: Patient was understandably sore but doing well this morning.  She was a little out of sorts due to the pain medicine.  Her daughter was at the bedside and all questions were answered to her satisfaction.  She is experiencing some left-sided leg weakness which is not uncommon following abdominal surgery.  Will continue to monitor and she will ambulate as tolerated today to get a better assessment.  Objective: Vital signs in last 24 hours: Temp:  [96.4 F (35.8 C)-99 F (37.2 C)] 98.3 F (36.8 C) (04/25 0444) Pulse Rate:  [88-114] 107 (04/25 0444) Resp:  [13-18] 17 (04/25 0444) BP: (152-172)/(80-95) 155/90 (04/25 0444) SpO2:  [98 %-100 %] 98 % (04/25 0444)  Intake/Output from previous day: 04/24 0701 - 04/25 0700 In: 6915 [I.V.:3233.6; IV Piggyback:3681.4] Out: 1900 [Urine:900; Drains:520; Stool:30; Blood:450] Intake/Output this shift: No intake/output data recorded.  Physical Exam:  General: Alert and oriented CV: Regular rate Lungs: Normal work of breathing on room air Abdomen: Soft, laparoscopic port sites and midline incision C/D/I. GU: Foley catheter in place to gravity drainage. light amber urine with minimal particulate matter in tubing Ext: NT, No erythema   Lab Results: Recent Labs    08/31/22 0245 09/01/22 0316 09/02/22 0238  HGB 7.8* 8.3* 7.2*  HCT 23.9* 25.3* 22.5*   BMET Recent Labs    09/01/22 0316 09/02/22 0238  NA 135 133*  K 2.7* 4.3  CL 106 100  CO2 21* 24  GLUCOSE 120* 159*  BUN 6* 7*  CREATININE 0.66 0.92  CALCIUM 6.7* 7.7*     Studies/Results: DG C-Arm 1-60 Min-No Report  Result Date: 09/01/2022 Fluoroscopy was utilized by the requesting physician.  No radiographic interpretation.    Assessment/Plan:  77 y.o. female admitted with pneumaturia and dysuria with colovesical fistula on recent CT imaging.  POD: 1 S/p laparoscopic converted to open sigmoid resection with anastomosis, diverting loop  ileostomy, take down of colovesical fistula with Drs. [name] and Bordon on 09/01/2022. 1 cm defect on left posterior aspect of the bladder closed intraoperatively.  Right ureteral stent placed for hydronephrosis Bilateral marking stents placed. Urine clear amber with minimal dark flakes.  Most likely representative of old blood. Serum creatinine within range at 0.92 Hgb 7.2.  Transfusion pending UOP: 900 cc, JP: 520 cc Foley catheter to stay in place for 1-2 weeks with cystogram prior to removal.    LOS: 6 days   CAMERON W SATTENFIELD 09/02/2022, 8:52 AM  I have seen and examined the patient and agree with the above assessment and plan.  Drain output decreasing and UOP good.  Continue Foley for about 7 days and will then consider cystogram.  Will check drain Cr tomorrow.

## 2022-09-02 NOTE — Evaluation (Signed)
Physical Therapy Evaluation Patient Details Name: April Weber MRN: [id number] DOB: 03-May-1946 Today's Date: 09/02/2022  History of Present Illness  April Weber is a 77 y.o. female s/p laparoscopic converted to open sigmoid resection with anastomosis, diverting loop ileostomy, take down of colovesical fistula 4/24. PMH: osteoarthritis, right breast cancer, stage IIIa CKD,single kidney, history of DVT, dyspnea, hyperlipidemia, hypertension, osteopenia, peripheral vascular disease, seasonal allergies, hypothyroidism   Clinical Impression  Pt admitted with above diagnosis. Pt from home, ind at baseline without AD, active lifestyle taking walks daily, drives. Pt currently needing min A with STS transfers from recliner and limited ambulation in room with RW. Pt with noted dyspnea with ambulation and HR 135 and SpO2 100%; returned to recliner and HR back down to 110s and SpO2 still 100%- RN notified. Pt would benefit from post acute PT. Pt currently with functional limitations due to the deficits listed below (see PT Problem List). Pt will benefit from acute skilled PT to increase their independence and safety with mobility to allow discharge.          Recommendations for follow up therapy are one component of a multi-disciplinary discharge planning process, led by the attending physician.  Recommendations may be updated based on patient status, additional functional criteria and insurance authorization.  Follow Up Recommendations       Assistance Recommended at Discharge Frequent or constant Supervision/Assistance  Patient can return home with the following  A little help with walking and/or transfers;A little help with bathing/dressing/bathroom;Assistance with cooking/housework;Help with stairs or ramp for entrance;Assist for transportation    Equipment Recommendations Other (comment) (TBD)  Recommendations for Other Services       Functional Status Assessment Patient has had a recent  decline in their functional status and demonstrates the ability to make significant improvements in function in a reasonable and predictable amount of time.     Precautions / Restrictions Precautions Precautions: Fall Precaution Comments: JP drain, ileostomy, foley Restrictions Weight Bearing Restrictions: No      Mobility  Bed Mobility  General bed mobility comments: in recliner    Transfers Overall transfer level: Needs assistance Equipment used: Rolling walker (2 wheels) Transfers: Sit to/from Stand Sit to Stand: Min assist  General transfer comment: min A to power up to standing, cues for hip extension, increased time, BUE assisting    Ambulation/Gait Ambulation/Gait assistance: Min assist Gait Distance (Feet): 8 Feet Assistive device: Rolling walker (2 wheels) Gait Pattern/deviations: Step-to pattern, Decreased stride length Gait velocity: decreased  General Gait Details: slow, short steps with RW, good steadiness without overt LOB, dyspnea noted with HR 135 and Spo2 100%, returned to Best boy    Modified Rankin (Stroke Patients Only)       Balance Overall balance assessment: Needs assistance  Standing balance support: Bilateral upper extremity supported, During functional activity, Reliant on assistive device for balance Standing balance-Leahy Scale: Poor       Pertinent Vitals/Pain Pain Assessment Pain Assessment: Faces Faces Pain Scale: Hurts little more Pain Location: surgical site Pain Intervention(s): Limited activity within patient's tolerance, Monitored during session, Repositioned    Home Living Family/patient expects to be discharged to:: Private residence Living Arrangements: Alone Available Help at Discharge: Neighbor;Available PRN/intermittently;Friend(s) (daughter lives in [place], [place]) Type of Home: House Home Access: Stairs to enter Entrance Stairs-Rails: Left Entrance Stairs-Number of Steps:  3-4   Home Layout: One level Home Equipment: None  Prior Function Prior Level of Function : Independent/Modified Independent;Driving  Mobility Comments: pt reports ind ADLs Comments: pt reports ind     Hand Dominance   Dominant Hand: Left    Extremity/Trunk Assessment   Upper Extremity Assessment Upper Extremity Assessment: Overall WFL for tasks assessed    Lower Extremity Assessment Lower Extremity Assessment: Overall WFL for tasks assessed (AROM WFL, strength grossly 3+/5, denies numbness/tingling throughout BLE)    Cervical / Trunk Assessment Cervical / Trunk Assessment: Normal  Communication   Communication: No difficulties  Cognition Arousal/Alertness: Awake/alert Behavior During Therapy: WFL for tasks assessed/performed Overall Cognitive Status: Within Functional Limits for tasks assessed       General Comments      Exercises     Assessment/Plan    PT Assessment Patient needs continued PT services  PT Problem List Decreased strength;Decreased activity tolerance;Decreased balance;Decreased mobility;Decreased safety awareness;Decreased knowledge of use of DME;Decreased knowledge of precautions;Pain       PT Treatment Interventions DME instruction;Gait training;Stair training;Functional mobility training;Therapeutic activities;Therapeutic exercise;Balance training;Neuromuscular re-education;Patient/family education    PT Goals (Current goals can be found in the Care Plan section)  Acute Rehab PT Goals Patient Stated Goal: less pain PT Goal Formulation: With patient Time For Goal Achievement: 09/16/22 Potential to Achieve Goals: Good    Frequency Min 1X/week     Co-evaluation               AM-PAC PT "6 Clicks" Mobility  Outcome Measure Help needed turning from your back to your side while in a flat bed without using bedrails?: A Little Help needed moving from lying on your back to sitting on the side of a flat bed without using bedrails?: A  Little Help needed moving to and from a bed to a chair (including a wheelchair)?: A Little Help needed standing up from a chair using your arms (e.g., wheelchair or bedside chair)?: A Little Help needed to walk in hospital room?: A Little Help needed climbing 3-5 steps with a railing? : A Lot 6 Click Score: 17    End of Session Equipment Utilized During Treatment: Gait belt Activity Tolerance: Patient tolerated treatment well Patient left: in chair;with call bell/phone within reach;with family/visitor present Nurse Communication: Mobility status;Other (comment) (HR 135 with amb) PT Visit Diagnosis: Other abnormalities of gait and mobility (R26.89);Muscle weakness (generalized) (M62.81);Pain Pain - part of body:  (abdomen)    Time: 1521-1550 PT Time Calculation (min) (ACUTE ONLY): 29 min   Charges:   PT Evaluation $PT Eval Low Complexity: 1 Low PT Treatments $Therapeutic Activity: 8-22 mins        Tori Dwanda Tufano PT, DPT 09/02/22, 4:18 PM

## 2022-09-02 NOTE — Progress Notes (Signed)
April Weber   DOB:Apr 14, 1946   MV#:[id number]    ASSESSMENT & PLAN:   History of ovarian cancer She has no evidence of disease At this point in time, it is important for her to recover from her surgery There is no plan for putting her on olaparib right now  I will set up outpatient follow-up in about a month  colovesical fistula & Colovaginal fistula status postsurgical repair I would defer to general surgery for management   Anemia chronic illness She has received blood transfusion last weekend, would recommend further blood transfusion We discussed some of the risks, benefits, and alternatives of blood transfusions. The patient is symptomatic from anemia and the hemoglobin level is critically low.  Some of the side-effects to be expected including risks of transfusion reactions, chills, infection, syndrome of volume overload and risk of hospitalization from various reasons and the patient is willing to proceed  Goals of care Improvement of pain control and definitive treatment for fistula   Discharge planning Unknown I will follow intermittently All questions were answered. The patient knows to call the clinic with any problems, questions or concerns.   The total time spent in the appointment was 40 minutes encounter with patients including review of chart and various tests results, discussions about plan of care and coordination of care plan  [name], MD 09/02/2022 8:08 AM  Subjective:  Her daughter is by the bedside.  She tolerated surgery well.  She denies postop pain.  We reviewed surgical report.  I recommend blood transfusion and recommend holding off olaparib until she has recovered from surgery  Objective:  Vitals:   09/01/22 2344 09/02/22 0444  BP: (!) 164/95 (!) 155/90  Pulse: (!) 114 (!) 107  Resp: 18 17  Temp: 99 F (37.2 C) 98.3 F (36.8 C)  SpO2: 100% 98%     Intake/Output Summary (Last 24 hours) at 09/02/2022 0808 Last data filed at 09/02/2022  0500 Gross per 24 hour  Intake 6914.98 ml  Output 1900 ml  Net 5014.98 ml    GENERAL:alert, no distress and comfortable NEURO: alert & oriented x 3 with fluent speech, no focal motor/sensory deficits   Labs:  Recent Labs    06/01/22 0848 06/07/22 0824 08/14/22 1929 08/23/22 2013 08/27/22 0823 08/28/22 0100 08/29/22 0959 08/31/22 0245 09/01/22 0316 09/02/22 0238  NA 134   < > 133* 129*   < > 128*   < > 137 135 133*  K 4.2   < > 4.2 4.2   < > 3.8   < > 3.9 2.7* 4.3  CL 97   < > 100 98   < > 98   < > 103 106 100  CO2 22   < > 23 21*   < > 22   < > 26 21* 24  GLUCOSE 93   < > 123* 98   < > 105*   < > 95 120* 159*  BUN 7*   < > 32* 16   < > 12   < > 8 6* 7*  CREATININE 0.90   < > 0.79 0.90   < > 0.97   < > 0.76 0.66 0.92  CALCIUM 8.8   < > 8.8* 8.8*   < > 8.1*   < > 7.4* 6.7* 7.7*  GFRNONAA  --    < > >60 >60   < > >60   < > >60 >60 >60  PROT 6.0   < > 6.2* 5.7*  --  5.4*  --   --   --   --   ALBUMIN 3.6*   < > 3.7 3.5  --  2.5*  --   --   --   --   AST 21   < > 12* 13*  --  13*  --   --   --   --   ALT 19   < > 11 10  --  12  --   --   --   --   ALKPHOS 101   < > 50 37*  --  35*  --   --   --   --   BILITOT 0.2   < > 0.7 0.4  --  0.5  --   --   --   --   BILIDIR 0.12  --   --   --   --   --   --   --   --   --    < > = values in this interval not displayed.    Studies:  DG C-Arm 1-60 Min-No Report  Result Date: 09/01/2022 Fluoroscopy was utilized by the requesting physician.  No radiographic interpretation.   CT ABDOMEN PELVIS WO CONTRAST  Result Date: 08/27/2022 CLINICAL DATA:  Worsening abdominal pain with history of ovarian cancer. Suspected colovaginal and/or colovesical fistula. Low-grade fever. EXAM: CT ABDOMEN AND PELVIS WITHOUT CONTRAST TECHNIQUE: Multidetector CT imaging of the abdomen and pelvis was performed following the standard protocol without IV contrast. RADIATION DOSE REDUCTION: This exam was performed according to the departmental dose-optimization  program which includes automated exposure control, adjustment of the mA and/or kV according to patient size and/or use of iterative reconstruction technique. COMPARISON:  CT without contrast 08/23/2022, CT with oral contrast only 08/14/2022, CT without contrast 06/13/2022 FINDINGS: Lower chest: No acute abnormality. Coronary artery calcifications and minimal chronic pericardial effusion are again noted. Normal heart size. The cardiac blood pool is less dense than the myocardium, consistent with anemia. This was seen previously. Hepatobiliary: There is a 1.1 cm cyst again noted in hepatic segment 8, Hounsfield density of 7.6. No other focal abnormality of liver is visible without contrast. The gallbladder is unremarkable. Prominent common bile duct is again noted measuring 12 mm, unchanged. Pancreas: No abnormality. Spleen: No abnormality.  No splenomegaly. Adrenals/Urinary Tract: There is no adrenal mass. Severe chronic atrophy again noted left kidney with small cysts. Right renal cortex is normal in attenuation. There has developed bilateral moderate hydroureteronephrosis into the pelvis where the ureters are difficult to follow due to the pre-existing ongoing inflammatory process along the sigmoid colon. There is likely ureteral tethering due to the inflammatory process contributing to obstructive uropathy. No stone disease is seen. The bladder is distended with air and fluid. There is a moderate amount of patchy enteric contrast and settling in the dependent bladder on the left-greater-than-right and now noted a demonstrable fistulous tract from the diseased abutting sigmoid colon for the left dorsal wall of the bladder, best seen on 2:64 and 65 and on sagittal reconstruction images 79-86. There previously was no enteric contrast in the bladder. Stomach/Bowel: The stomach is contracted. A 3.5 cm descending duodenal diverticulum is again noted, with air contrast level. No small bowel dilatation or inflammation is  seen. The appendix is not seen. There is moderate retained dense contrasted stool again in the ascending, transverse descending colon. Diverticulosis greatest in the sigmoid is seen with continued sigmoid wall thickening and inflammatory stranding. Further evaluation to exclude  underlying colonic lesion is recommended when clinically feasible. Inflammatory reaction around the diseased segment has increased since April 15 and certainly since April 6 as well. Despite the ongoing inflammatory process no free air or abscess is seen in the area. No focal rectal abnormality is seen. Vascular/Lymphatic: Aortic atherosclerosis. No enlarged abdominal or pelvic lymph nodes. Reproductive: There previously was a well seen colovaginal fistula from the undersurface of the sigmoid colon into the left superior vaginal cavity, but this is not as well seen today. Contrast in the vagina is noted however, consistent with continued patency of the tract. The uterus is absent. No adnexal mass is seen. Other: Small amounts of increased pelvic reactive fluid, increased trace reactive fluid along the distal left paracolic gutter. No free air, free hemorrhage or abscess is seen. There are no incarcerated hernias. Musculoskeletal: There is dextroscoliosis, osteopenia and advanced degenerative change of the lumbar spine, with acquired spinal stenosis again noted L4-5. No destructive osseous or lytic lesions. IMPRESSION: 1. Continued sigmoid wall thickening and inflammatory reaction with diverticulosis with increased inflammation and layering reactive pelvic fluid since April 15. Further evaluation to exclude underlying colonic lesion is recommended when clinically feasible. 2. There is now a demonstrable fistulous tract from the sigmoid colon into the left dorsal bladder wall, with air and fluid in the bladder and patchy enteric contrast now settling in the dependent bladder. 3. The known left-sided colovaginal fistula is not well seen today  but there is contrast in the vagina consistent with continued tract patency. 4. There is now moderate bilateral hydroureteronephrosis into the pelvis where the ureters are difficult to follow due to the pre-existing inflammatory process, with ureteral tethering by inflammatory process most likely causing the obstructive uropathy. No stone disease is seen. 5. Severe chronic atrophy of the left kidney. 6. Aortic and coronary artery atherosclerosis. 7. Anemia. 8. Prominent common bile duct at 12 mm, unchanged. 9. Constipation. 10. Osteopenia, scoliosis, and advanced degenerative change of the lumbar spine with acquired spinal stenosis L4-5. 11. These results will be called to the ordering clinician or representative by the Radiologist Assistant, and communication documented in the PACS or [employer]. Aortic Atherosclerosis (ICD10-I70.0). Electronically Signed   By: Almira Bar M.D.   On: 08/27/2022 21:39   CT ABDOMEN PELVIS WO CONTRAST  Result Date: 08/23/2022 CLINICAL DATA:  Abdominal pain, acute, non localized, colovaginal fistula on prior imaging with worsening abdominal pain. Urinary retention EXAM: CT ABDOMEN AND PELVIS WITHOUT CONTRAST TECHNIQUE: Multidetector CT imaging of the abdomen and pelvis was performed following the standard protocol without IV contrast. RADIATION DOSE REDUCTION: This exam was performed according to the departmental dose-optimization program which includes automated exposure control, adjustment of the mA and/or kV according to patient size and/or use of iterative reconstruction technique. COMPARISON:  CT abdomen and pelvis 08/14/2022 FINDINGS: Lower chest: No acute abnormality. Hepatobiliary: No suspicious focal hepatic lesion. Mild distention of the gallbladder. Unchanged dilation of the common bile duct measuring up to 14 mm. No radiopaque stones or evidence of cholecystitis. Pancreas: Unremarkable. Spleen: Unremarkable. Adrenals/Urinary Tract: Normal adrenal glands. Severe  atrophy of the left kidney. Normal appearance of the right kidney. No urinary calculi. Slight decrease in pelviectasis and dilation of the left ureter. Increased large volume gas within the bladder. The inflamed sigmoid colon abuts the superior left aspect of the bladder. Given the increased large amount of gas a colovesical fistula is difficult to exclude. Stomach/Bowel: Diverticulosis and wall thickening of the sigmoid colon with adjacent inflammatory change is  similar to slightly decreased from 08/14/2022. Small amount of pneumatosis in the sigmoid colon near the area of the colovaginal fistula. The previous collection of gas involving the wall of the sigmoid colon has decreased. There is a small amount of residual gas in the area of the left vaginal cuff (series 2/image 67). There is again a soft tissue tract extending from the vaginal cuff to the sigmoid colon (circa series 5/image 39) though no gas is seen within this tract. Normal caliber large and small bowel. Moderate colonic stool load mixed with contrast. Decreased size and wall thickening about the large diverticulum in the proximal transverse colon (series 5/image 52). Redemonstrated large duodenal diverticulum. Vascular/Lymphatic: Aortic atherosclerosis. No enlarged abdominal or pelvic lymph nodes. Reproductive: Hysterectomy.  No adnexal mass. Other: Small volume free fluid in the pelvis. No free intraperitoneal air. Musculoskeletal: No acute osseous abnormality. IMPRESSION: 1. Increased large volume gas within the bladder. This raises concern for colovesical fistula. Consider CT cystogram for further evaluation. 2. Similar to slightly decreased inflammatory changes surrounding the sigmoid colon. The previous collection of gas involving the wall of the sigmoid colon has decreased. There is a small amount of residual gas in the area of the left vaginal cuff. There is again a soft tissue tract extending from the vaginal cuff to the sigmoid colon, though  no gas is seen within this tract. 3. Decreased size and wall thickening about the large diverticulum in the proximal transverse colon. 4. Slight decrease in pelviectasis and dilation of the left ureter. Aortic Atherosclerosis (ICD10-I70.0). Electronically Signed   By: Minerva Fester M.D.   On: 08/23/2022 21:39   CT ABDOMEN PELVIS WO CONTRAST  Result Date: 08/14/2022 CLINICAL DATA:  Pelvic pain; history of ovarian cancer EXAM: CT ABDOMEN AND PELVIS WITHOUT CONTRAST TECHNIQUE: Multidetector CT imaging of the abdomen and pelvis was performed following the standard protocol without IV contrast. RADIATION DOSE REDUCTION: This exam was performed according to the departmental dose-optimization program which includes automated exposure control, adjustment of the mA and/or kV according to patient size and/or use of iterative reconstruction technique. COMPARISON:  Multiple priors, most recent CT abdomen and pelvis dated June 13, 2022 FINDINGS: Lower chest: No acute abnormality. Hepatobiliary: Stable small low-attenuation lesion of the right lobe of the liver measuring 7 mm on series 2, image 14, likely a simple cysts. Gallbladder is unremarkable. Biliary ductal dilation. Pancreas: Unremarkable. No pancreatic ductal dilatation or surrounding inflammatory changes. Spleen: Normal in size without focal abnormality. Adrenals/Urinary Tract: Bilateral adrenal glands are unremarkable. Atrophic left kidney. New pelviectasis and mild dilation of the left ureter. Low-attenuation lesions of the left kidney, largest are compatible with simple cysts, others are too small to accurately characterize, no specific follow-up imaging needed. Compensatory hypertrophy of the right kidney. No hydronephrosis. Wall thickening of the left lateral wall of the urinary bladder with air seen within the urinary bladder. Stomach/Bowel: Contrast material is seen throughout the small and large bowel. Wall thickening of the sigmoid colon with adjacent  inflammatory change, decreased when compared with prior exam. New focal collection of gas involving the wall of the sigmoid colon which extends inferiorly to the area of the left vaginal cuff with contrast material seen within the vaginal cuff. Increased wall thickening of a large colonic diverticulum of the proximal transverse colon, best seen on series 5, image 61. Large duodenal diverticulum. No evidence of obstruction. Vascular/Lymphatic: Aortic atherosclerosis. No enlarged abdominal or pelvic lymph nodes. Reproductive: No adnexal masses. Other: Postsurgical changes of the anterior  abdomen. Musculoskeletal: No acute or significant osseous findings. IMPRESSION: 1. Wall thickening of the sigmoid colon with adjacent inflammatory change, overall improved when compared with prior exam. New focal collection of gas involving the wall of the sigmoid colon which extends inferiorly to the area of the left vaginal cuff with contrast material seen within the vaginal cuff. 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 2. Wall thickening of the left lateral wall of the urinary bladder with air seen within the urinary bladder, findings raise concern for additional area of fistulization to the bladder. CT cystogram could be performed for better evaluation. 3. New pelviectasis and mild dilation of the left ureter, likely secondary to inflammatory change at the area of the left ureterovesicular junction. Chronic atrophy of the left kidney. 4. Increased wall thickening of a large colonic diverticulum of the proximal transverse colon, likely due to diverticulitis. 5. Aortic Atherosclerosis (ICD10-I70.0). Electronically Signed   By: Allegra Lai M.D.   On: 08/14/2022 17:06

## 2022-09-02 NOTE — Consult Note (Addendum)
WOC Nurse ostomy consult note; patient with open sigmoid resection w/diverting loop ileostomy 09/01/2022 Dr. Sheliah Hatch Stoma type/location:  RUQ loop ileostomy  Stomal assessment/size: 1 3/4" loop ileostomy,  pink moist well budded, oval, superior os with green liquid output  Peristomal assessment: intact  Treatment options for stomal/peristomal skin: 2" barrier ring  Output 150 mls liquid green  Ostomy pouching: 2 piece 2 3/4" skin barrier [place] #2), high output pouch [place] [phone]), and 2" skin barrier [place] [phone]) Education provided: Patient and daughter in room. Daughter is a PA in [place] and has had experience working with ostomies before. However, daughter will not be with patient after discharge and patient will be managing her ostomy independently.  Patient is very nervous with lots of questions regarding care of ostomy.  I assured patient that this is just an initial teaching session and she will have additional sessions with ostomy nurses for education.    Educated patient that she will need to empty pouch several times a day when 1/3 to 1/2 full.  Put patient in a high output pouch today but did demonstrate how to utilize lock and roll mechanism on the one piece pouch she is currently wearing. Patient was able to utilize lock and roll to empty pouch herself and use toilet paper wick to clean spout.  Demonstrated push pull method to remove old pouch.  Educated patient and daughter that water moistened washcloth should be used for cleaning and to avoid any lotions or baby wipes to prevent residue on skin. Patient is tender at this time as first day  postop and we discussed that this will improve with time. We did discuss that the stoma itself is insensate and may bleed when cleaned but this is a normal finding.  Demonstrated to patient how to size stoma and discussed how she will need to size stoma with each pouch change for first month as edema will subside and size may change.  Demonstrated  to patient how to cut new skin barrier.  Stretched 2" skin barrier ring to fit snugly around stoma and placed new skin barrier as well.  Nurse demonstrated how to attach pouch to skin barrier.    Discussed with patient monitoring output of ileostomy as high output can put her at risk for dehydration.  Went over high output ileostomy education sheet with patient and daughter.  We discussed that now her output is liquid, surgeon may opt to add medications to help thicken stool and lessen amount of output.  We did discuss foods to avoid to prevent food blockage and caution with use of enteric coated or timed release medications  Patient states she is tired and overwhelmed at this visit.  I encouraged patient that we will continue education sessions with her while inpatient so she will have more than one teaching session.  We did review educational materials in handbook including step by step process for a 2 piece pouch change.  Patient will have home health at discharge but ultimately will need to be independent with care of her ostomy as she lives alone and daughter is over 2 hours away.    Ordered 5 sets of:   2 3/4" skin barrier [place] #2),  2 3/4" high output pouch [place] [phone]), 2" barrier ring [place] [phone].  Also placed no sting barrier wipes and stoma powder from floor stock in room.    Enrolled patient in [employer] DC program: Yes  WOC team will follow patient for further ostomy education and  support.   Thank you,   ,  Priscella Mann MSN, RN-BC, [employer] [phone]

## 2022-09-02 NOTE — Telephone Encounter (Signed)
Spoke with patient confirming upcoming appointments  

## 2022-09-02 NOTE — Anesthesia Postprocedure Evaluation (Signed)
Anesthesia Post Note  Patient: TABYTHA GRADILLAS  Procedure(s) Performed: LAPAROSCOPIC SIGMOIDECTOMY CONVERTED TO OPEN, TAKEDOWN OF COLOVESICAL FISTULA AND LOOP COLOSTOMY (Abdomen) CYSTOSCOPY WITH BILATERAL URETERAL STENT PLACEMENTS (Bladder) REPAIR BLADDER FISTULA WITH HYDRODISTENTION WITH METHYLINE BLUE (Abdomen)     Patient location during evaluation: PACU Anesthesia Type: General Level of consciousness: awake Pain management: pain level controlled Vital Signs Assessment: post-procedure vital signs reviewed and stable Respiratory status: spontaneous breathing, nonlabored ventilation and respiratory function stable Cardiovascular status: blood pressure returned to baseline and stable Postop Assessment: no apparent nausea or vomiting Anesthetic complications: no   No notable events documented.  Last Vitals:  Vitals:   09/02/22 1252 09/02/22 1501  BP: (!) 162/85 (!) 169/82  Pulse: (!) 108 (!) 103  Resp:    Temp: 37 C 37.1 C  SpO2: 99% 99%    Last Pain:  Vitals:   09/02/22 1142  TempSrc: Oral  PainSc:    Pain Goal: Patients Stated Pain Goal: 2 (08/29/22 2030)                 Alycia Rossetti P Razan Siler

## 2022-09-02 NOTE — Progress Notes (Signed)
PROGRESS NOTE    April Weber  UJW:[id number] DOB: 1945-06-19 DOA: 08/27/2022 PCP: April Ip, DO   Brief Narrative:  April Weber is a 77 y.o. female with medical history significant of osteoarthritis, right breast cancer, stage IIIa CKD,, single kidney, history of DVT, dyspnea, hyperlipidemia, hypertension, osteopenia, peripheral vascular disease, seasonal allergies, hypothyroidism who presented to the emergency department complaints of decreased appetite, nausea, lower abdominal pain, constipation and low-grade fever for the past few days.    Patient has known colovesicular and colovaginal fistula discovered earlier this month after recurrent infections.  CT abdomen pelvis without contrast done just 4 days prior to admission shows increased large volume of gas within the bladder.  Of note she just recently completed chemotherapy the first week of April.  As such urology surgery and oncology have been called in consult.  Hospitalist has been called for admission.  Assessment & Plan:   Principal Problem:   Colovesical fistula Active Problems:   Hyperlipidemia   HTN (hypertension)   Hypothyroidism   Stage 3a chronic kidney disease   Hyponatremia   Right ovarian epithelial cancer  Sepsis secondary to subacute vs chronic colovesical/colovaginal fistulas, POA, resolving -Surgical intervention with ostomy 09/01/2022, tolerated procedure quite well -Urology and general surgery continue to follow -Continue broad-spectrum antibiotics(ceftriaxone, Flagyl) -Urine culture growing staph hemolyticus as well as Enterococcus, and linezolid x 3 days continue Macrobid for coverage of hemolyticus -Continue advance diet as tolerated per surgery, currently on full liquid diet  Hyponatremia, hypovolemic -Continue IV fluids, urinalysis markedly abnormal given fistula/contamination -Once p.o. intake is appropriate discontinue IV fluids   Acute on chronic anemia of chronic disease   -Hemoglobin downtrending, 1 unit PRBC per discussion with oncology -No overt source of bleeding, postoperative anemia slightly worse than prior baseline -Previous CT(prior to surgery) abdomen pelvis without overt intra-abdominal bleeding  Stage 3a chronic kidney disease -Renal function appears to be within normal limits, will have patient follow-up with PCP to further classify her CKD staging   Hyperlipidemia -Low-salt low-fat diet, resume statin once able to take p.o. safely   HTN (hypertension) - Home atenolol on hold, previously normotensive in setting of above   Hypothyroidism Resume home thyroid supplementation  Right ovarian epithelial cancer Follow-up with April Weber as scheduled. Completed chemotherapy first week of April  DVT prophylaxis: Holding in the setting of worsening anemia, continue early ambulation, SCDs Code Status: Full Family Communication: April Weber updated over the phone  Status is: Inpatient  Dispo: The patient is from: Home              Anticipated d/c is to: To be determined              Anticipated d/c date is: To be determined -expect prolonged hospitalization              Patient currently not medically stable for discharge  Consultants:  Urology, general surgery, oncology  Procedures:  Laparoscopic converted to open sigmoid resection with anastomosis, diverting loop ileostomy, take down of colovesical fistula   Antimicrobials:  Ceftriaxone, Flagyl  Subjective: No acute issues or events overnight, requesting more liberalized diet other than clears but denies nausea vomiting headache fever chills or chest pain.  Abdominal pain postoperatively ongoing but improved from yesterday.  Well-controlled on current regimen. Objective: Vitals:   09/01/22 1725 09/01/22 1955 09/01/22 2344 09/02/22 0444  BP: (!) 156/80 (!) 156/88 (!) 164/95 (!) 155/90  Pulse: 90 (!) 101 (!) 114 (!) 107  Resp: 16 17  18 17  Temp: (!) 96.4 F (35.8 C) 98.6 F (37 C) 99  F (37.2 C) 98.3 F (36.8 C)  TempSrc:  Oral Oral Oral  SpO2:  100% 100% 98%  Weight:      Height:        Intake/Output Summary (Last 24 hours) at 09/02/2022 0803 Last data filed at 09/02/2022 0500 Gross per 24 hour  Intake 6914.98 ml  Output 1900 ml  Net 5014.98 ml    Filed Weights   08/27/22 0743  Weight: 71.7 kg    Examination:  General:  Pleasantly resting in bed, No acute distress. HEENT:  Normocephalic atraumatic.  Sclerae nonicteric, noninjected.  Extraocular movements intact bilaterally. Neck:  Without mass or deformity.  Trachea is midline. Lungs:  Clear to auscultate bilaterally without rhonchi, wheeze, or rales. Heart:  Regular rate and rhythm.  Without murmurs, rubs, or gallops. Abdomen: Soft minimally tender, postoperative bandages clean dry intact, ostomy site without purulence or erythema Extremities: Without cyanosis, clubbing, edema, or obvious deformity. Vascular:  Dorsalis pedis and posterior tibial pulses palpable bilaterally. Skin:  Warm and dry, no erythema  Data Reviewed: I have personally reviewed following labs and imaging studies  CBC: Recent Labs  Lab 08/26/22 1207 08/27/22 0823 08/27/22 2335 08/29/22 0959 08/30/22 0526 08/31/22 0245 09/01/22 0316 09/02/22 0238  WBC 6.1 5.4   < > 3.9* 3.3* 2.2* 5.5 10.3  NEUTROABS 4.9 4.4  --   --   --   --   --   --   HGB 7.9* 7.2*   < > 7.3* 8.1* 7.8* 8.3* 7.2*  HCT 23.9* 21.8*   < > 21.9* 24.9* 23.9* 25.3* 22.5*  MCV 98.0 99.1   < > 97.3 97.6 99.2 98.4 100.4*  PLT 180 133*   < > 107* 105* 105* 139* 149*   < > = values in this interval not displayed.    Basic Metabolic Panel: Recent Labs  Lab 08/29/22 0959 08/30/22 0526 08/31/22 0245 09/01/22 0316 09/02/22 0238  NA 131* 134* 137 135 133*  K 3.4* 3.5 3.9 2.7* 4.3  CL 101 98 103 106 100  CO2 25 26 26  21* 24  GLUCOSE 100* 93 95 120* 159*  BUN 6* 8 8 6* 7*  CREATININE 0.89 0.77 0.76 0.66 0.92  CALCIUM 7.7* 7.7* 7.4* 6.7* 7.7*     GFR: Estimated Creatinine Clearance: 52 mL/min (by C-G formula based on SCr of 0.92 mg/dL). Liver Function Tests: Recent Labs  Lab 08/28/22 0100  AST 13*  ALT 12  ALKPHOS 35*  BILITOT 0.5  PROT 5.4*  ALBUMIN 2.5*    No results for input(s): "LIPASE", "AMYLASE" in the last 168 hours.  Sepsis Labs: Recent Labs  Lab 08/27/22 1020 08/27/22 1430  LATICACIDVEN 0.6 0.8     Recent Results (from the past 240 hour(s))  Urine Culture     Status: Abnormal   Collection Time: 08/23/22 10:35 PM   Specimen: Urine, Clean Catch  Result Value Ref Range Status   Specimen Description   Final    URINE, CLEAN CATCH Performed at [facility], [address], [place], [place] [address]    Special Requests   Final    NONE Performed at [facility], [address] [place], [place] [address]    Culture (A)  Final    <10,000 COLONIES/mL INSIGNIFICANT GROWTH Performed at [facility], [address] [place], [place] [address]    Report Status 08/25/2022 FINAL  Final  Culture, blood (Routine X 2) w Reflex to ID Panel     Status: None   Collection Time: 08/27/22 11:00 AM   Specimen: BLOOD  Result Value Ref Range Status   Specimen Description   Final    BLOOD SITE NOT SPECIFIED Performed at [facility], [address], [place], [place] [address]    Special Requests   Final    BOTTLES DRAWN AEROBIC AND ANAEROBIC Blood Culture results may not be optimal due to an excessive volume of blood received in culture bottles Performed at [facility], [address], [place], [place] [address]    Culture   Final    NO GROWTH 5 DAYS Performed at [facility], [address], [place], [place] [address]    Report Status 09/01/2022 FINAL  Final  Culture, blood (Routine X 2) w Reflex to ID Panel     Status: None   Collection Time: 08/27/22 11:00 AM   Specimen: BLOOD  Result Value Ref Range Status    Specimen Description   Final    BLOOD BLOOD LEFT WRIST Performed at [facility], [address] [place], [place] [address]    Special Requests   Final    BOTTLES DRAWN AEROBIC AND ANAEROBIC Blood Culture results may not be optimal due to an excessive volume of blood received in culture bottles Performed at [facility], [address] [place], [place] [address]    Culture   Final    NO GROWTH 5 DAYS Performed at [facility], [address] [place], [place] [address]    Report Status 09/01/2022 FINAL  Final  Urine Culture     Status: Abnormal   Collection Time: 08/27/22  4:39 PM   Specimen: Urine, Random  Result Value Ref Range Status   Specimen Description   Final    URINE, RANDOM Performed at [facility], [address], [place], [place] [address]    Special Requests   Final    NONE Reflexed from [id number] Performed at [facility], [address], [place], [place] [address]    Culture (A)  Final    >=100,000 COLONIES/mL STAPHYLOCOCCUS HAEMOLYTICUS 20,000 COLONIES/mL ENTEROCOCCUS FAECIUM    Report Status 09/01/2022 FINAL  Final   Organism ID, Bacteria STAPHYLOCOCCUS HAEMOLYTICUS (A)  Final   Organism ID, Bacteria ENTEROCOCCUS FAECIUM (A)  Final      Susceptibility   Enterococcus faecium - MIC*    AMPICILLIN <=2 SENSITIVE Sensitive     NITROFURANTOIN 64 INTERMEDIATE Intermediate     VANCOMYCIN <=0.5 SENSITIVE Sensitive     * 20,000 COLONIES/mL ENTEROCOCCUS FAECIUM   Staphylococcus haemolyticus - MIC*    CIPROFLOXACIN >=8 RESISTANT Resistant     GENTAMICIN >=16 RESISTANT Resistant     NITROFURANTOIN <=16 SENSITIVE Sensitive     OXACILLIN >=4 RESISTANT Resistant     TETRACYCLINE 2 SENSITIVE Sensitive     VANCOMYCIN 1 SENSITIVE Sensitive     TRIMETH/SULFA 160 RESISTANT Resistant     CLINDAMYCIN >=8 RESISTANT Resistant     RIFAMPIN >=32 RESISTANT Resistant     Inducible Clindamycin  NEGATIVE Sensitive     * >=100,000 COLONIES/mL STAPHYLOCOCCUS HAEMOLYTICUS         Radiology Studies: DG C-Arm 1-60 Min-No Report  Result Date: 09/01/2022 Fluoroscopy was utilized by the requesting physician.  No radiographic interpretation.    Scheduled Meds:  Chlorhexidine Gluconate Cloth  6 each Topical Daily  enoxaparin (LOVENOX) injection  40 mg Subcutaneous Q24H   linezolid  600 mg Oral Q12H   liver oil-zinc oxide   Topical Q6H   sodium chloride flush  10-40 mL Intracatheter Q12H   Continuous Infusions:  cefTRIAXone (ROCEPHIN)  IV Stopped (09/01/22 2143)   lactated ringers 75 mL/hr at 09/02/22 0326   metronidazole Stopped (09/01/22 2342)     LOS: 6 days   Time spent:  Azucena Fallen, DO [facility]  If 7PM-7AM, please contact night-coverage [email or web address]  09/02/2022, 8:03 AM

## 2022-09-02 NOTE — Progress Notes (Signed)
1 Day Post-Op   Chief Complaint/Subjective: Having abdominal pain across her upper abdomen exacerbated by deep breaths. Minimal pain at incision. Having left leg pain/weakness as well. Ileostomy with liquid output.  Daughter at bedisde  Objective: Vital signs in last 24 hours: Temp:  [96.4 F (35.8 C)-99 F (37.2 C)] 98.4 F (36.9 C) (04/25 0902) Pulse Rate:  [88-114] 96 (04/25 0902) Resp:  [13-18] 18 (04/25 0902) BP: (152-172)/(77-95) 160/77 (04/25 0902) SpO2:  [97 %-100 %] 97 % (04/25 0902) Last BM Date : 09/01/22 Intake/Output from previous day: 04/24 0701 - 04/25 0700 In: 6915 [I.V.:3233.6; IV Piggyback:3681.4] Out: 1900 [Urine:900; Drains:520; Stool:30; Blood:450]  PE: Gen: NAD Resp: nonlabored on room air Abd: soft, appropriate mild TTP across upper abdomen and over incision -incision with honeycomb CDI, lap incision dressing CDI.  Ileostomy stoma pink and viable and liquid output draining to gravity bag.  JP with SS output. GU: Foley amber urine in bag  Lab Results:  Recent Labs    09/01/22 0316 09/02/22 0238  WBC 5.5 10.3  HGB 8.3* 7.2*  HCT 25.3* 22.5*  PLT 139* 149*    Recent Labs    09/01/22 0316 09/02/22 0238  NA 135 133*  K 2.7* 4.3  CL 106 100  CO2 21* 24  GLUCOSE 120* 159*  BUN 6* 7*  CREATININE 0.66 0.92  CALCIUM 6.7* 7.7*    No results for input(s): "LABPROT", "INR" in the last 72 hours.    Component Value Date/Time   NA 133 (L) 09/02/2022 0238   NA 134 06/01/2022 0848   K 4.3 09/02/2022 0238   CL 100 09/02/2022 0238   CO2 24 09/02/2022 0238   GLUCOSE 159 (H) 09/02/2022 0238   BUN 7 (L) 09/02/2022 0238   BUN 7 (L) 06/01/2022 0848   CREATININE 0.92 09/02/2022 0238   CREATININE 0.93 08/10/2022 0857   CREATININE 0.99 02/05/2013 0757   CALCIUM 7.7 (L) 09/02/2022 0238   PROT 5.4 (L) 08/28/2022 0100   PROT 6.0 06/01/2022 0848   ALBUMIN 2.5 (L) 08/28/2022 0100   ALBUMIN 3.6 (L) 06/01/2022 0848   AST 13 (L) 08/28/2022 0100   AST 12  (L) 08/10/2022 0857   ALT 12 08/28/2022 0100   ALT 11 08/10/2022 0857   ALKPHOS 35 (L) 08/28/2022 0100   BILITOT 0.5 08/28/2022 0100   BILITOT 0.3 08/10/2022 0857   GFRNONAA >60 09/02/2022 0238   GFRNONAA >60 08/10/2022 0857   GFRNONAA 59 (L) 02/05/2013 0757   GFRAA 61 05/20/2020 0850   GFRAA 68 02/05/2013 0757    Assessment/Plan 77 yo female with colovesical fistula with recurrent UTIs POD 1 s/p  laparoscopic converted to open sigmoid resection with anastomosis, diverting loop ileostomy, take down of colovesical fistula 4/24 Dr. Sheliah Hatch Intraoperative bladder repair - Dr. Laverle Patter  - surgical path pending - hgb 7.2 from 8.3 - 1 uprbc pending. Continue to trend - JP drain output high in setting of intra-op irrigation. Cont to trend. Cont drain - continue foley and follow up with urology as planned, appreciate their assistance - ileostomy productive. Tolerating clears.  Advance to full liquid diet -Continue antibiotics for now for intra-abdominal coverage -Encouraged ambulation and IS use  FEN - FLD VTE - lovenox ID - rocephin/flagyl. linezolid   LOS: 6 days   I reviewed last 24 h vitals and pain scores, last 48 h intake and output, last 24 h labs and trends, and last 24 h imaging results.  This care required high  level of  medical decision making.   Francena Hanly [facility] Surgery at [facility] 09/02/2022, 10:13 AM Please see Amion for pager number during day hours 7:00am-4:30pm or 7:00am -11:30am on weekends

## 2022-09-03 DIAGNOSIS — N321 Vesicointestinal fistula: Secondary | ICD-10-CM | POA: Diagnosis not present

## 2022-09-03 LAB — CREATININE, FLUID (PLEURAL, PERITONEAL, JP DRAINAGE): Creat, Fluid: 1 mg/dL

## 2022-09-03 LAB — SURGICAL PATHOLOGY

## 2022-09-03 LAB — BPAM RBC
Blood Product Expiration Date: 202405242359
ISSUE DATE / TIME: 202404251142
ISSUE DATE / TIME: 202404261506
Unit Type and Rh: 6200
Unit Type and Rh: 6200

## 2022-09-03 LAB — CBC
HCT: 22.4 % — ABNORMAL LOW (ref 36.0–46.0)
Hemoglobin: 7.5 g/dL — ABNORMAL LOW (ref 12.0–15.0)
MCH: 32.2 pg (ref 26.0–34.0)
MCHC: 33.5 g/dL (ref 30.0–36.0)
MCV: 96.1 fL (ref 80.0–100.0)
Platelets: 150 10*3/uL (ref 150–400)
RBC: 2.33 MIL/uL — ABNORMAL LOW (ref 3.87–5.11)
RDW: 18.3 % — ABNORMAL HIGH (ref 11.5–15.5)
WBC: 8.2 10*3/uL (ref 4.0–10.5)
nRBC: 0 % (ref 0.0–0.2)

## 2022-09-03 LAB — BASIC METABOLIC PANEL
Anion gap: 7 (ref 5–15)
BUN: 17 mg/dL (ref 8–23)
CO2: 24 mmol/L (ref 22–32)
Calcium: 7.3 mg/dL — ABNORMAL LOW (ref 8.9–10.3)
Chloride: 101 mmol/L (ref 98–111)
Creatinine, Ser: 1.11 mg/dL — ABNORMAL HIGH (ref 0.44–1.00)
GFR, Estimated: 51 mL/min — ABNORMAL LOW (ref >60)
Glucose, Bld: 106 mg/dL — ABNORMAL HIGH (ref 70–99)
Potassium: 4.1 mmol/L (ref 3.5–5.1)
Sodium: 132 mmol/L — ABNORMAL LOW (ref 135–145)

## 2022-09-03 LAB — GLUCOSE, CAPILLARY: Glucose-Capillary: 119 mg/dL — ABNORMAL HIGH (ref 70–99)

## 2022-09-03 LAB — PREPARE RBC (CROSSMATCH)

## 2022-09-03 LAB — TYPE AND SCREEN: Unit division: 0

## 2022-09-03 MED ORDER — ENSURE ENLIVE PO LIQD
237.0000 mL | Freq: Two times a day (BID) | ORAL | Status: DC
  Administered 2022-09-03 – 2022-09-07 (×9): 237 mL via ORAL

## 2022-09-03 MED ORDER — ATENOLOL 25 MG PO TABS
12.5000 mg | ORAL_TABLET | Freq: Two times a day (BID) | ORAL | Status: DC
  Administered 2022-09-03 – 2022-09-08 (×10): 12.5 mg via ORAL
  Filled 2022-09-03 (×10): qty 1

## 2022-09-03 MED ORDER — SODIUM CHLORIDE 0.9% IV SOLUTION: Freq: Once | INTRAVENOUS | Status: AC

## 2022-09-03 NOTE — Progress Notes (Addendum)
Urology Progress Note    Subjective: Pt in good spirits this am. Pain is manageable. Diet has been advanced and is tolerated well. Has been ambulating small amount with assistance.   Objective: Vital signs in last 24 hours: Temp:  [97.7 F (36.5 C)-98.8 F (37.1 C)] 97.7 F (36.5 C) (04/26 0431) Pulse Rate:  [102-119] 102 (04/26 0431) Resp:  [16-20] 16 (04/26 0431) BP: (142-169)/(72-89) 142/82 (04/26 0431) SpO2:  [98 %-99 %] 98 % (04/26 0431)  Intake/Output from previous day: 04/25 0701 - 04/26 0700 In: 2909.2 [P.O.:824; I.V.:1478; Blood:300; IV Piggyback:307.2] Out: 2865 [Urine:1700; Drains:265; Stool:900] Intake/Output this shift: No intake/output data recorded.  Physical Exam:  General: Alert and oriented CV: Regular rate Lungs: Normal work of breathing on room air Abdomen: Soft, nontender, nondistended. Colostomy in place with thin brown stool.  GU: Foley catheter in place to gravity drainage. Urine draining is clear yellow.  Ext: NT, No erythema  Lab Results: Recent Labs    09/01/22 0316 09/02/22 0238 09/03/22 0334  HGB 8.3* 7.2* 7.5*  HCT 25.3* 22.5* 22.4*   BMET Recent Labs    09/02/22 0238 09/03/22 0334  NA 133* 132*  K 4.3 4.1  CL 100 101  CO2 24 24  GLUCOSE 159* 106*  BUN 7* 17  CREATININE 0.92 1.11*  CALCIUM 7.7* 7.3*     Studies/Results: DG C-Arm 1-60 Min-No Report  Result Date: 09/01/2022 Fluoroscopy was utilized by the requesting physician.  No radiographic interpretation.    Assessment/Plan:  77 y.o. female admitted with pneumaturia and dysuria with colovesical fistula on recent CT imaging.  - Urology to accompany general surgery to the OR for placement of ureteral marking stents and assist with bladder closure. Marland Kitchen  - Drain creatinine sent this am. If this is consistent with serum, ok to remove JP and general surgery's discretion.   -Will re-evaluate Monday and plan cystogram, possibly while inpatient, depending on d/c plans.    -Will continue to follow      LOS: 7 days   Brodyn Depuy W Travarus Trudo 09/03/2022, 9:19 AM

## 2022-09-03 NOTE — Progress Notes (Signed)
2 Days Post-Op   Chief Complaint/Subjective: Feeling much better today. Still some pain in upper abdomen and left hip but improved. Nausea yesterday and didn't try fulls but is having them for breakfast this am without any worsening nausea so far. She has been oob and ambulated  Objective: Vital signs in last 24 hours: Temp:  [97.7 F (36.5 C)-98.8 F (37.1 C)] 97.7 F (36.5 C) (04/26 0431) Pulse Rate:  [96-119] 102 (04/26 0431) Resp:  [16-20] 16 (04/26 0431) BP: (142-169)/(72-89) 142/82 (04/26 0431) SpO2:  [97 %-99 %] 98 % (04/26 0431) Last BM Date : 09/01/22 Intake/Output from previous day: 04/25 0701 - 04/26 0700 In: 2909.2 [P.O.:824; I.V.:1478; Blood:300; IV Piggyback:307.2] Out: 2865 [Urine:1700; Drains:265; Stool:900]  PE: Gen: NAD Resp: nonlabored on room air Abd: soft, appropriate mild TTP across upper abdomen and over incision which is improved from yesterday -incision with honeycomb CDI, lap incision dressing CDI.  Ileostomy stoma pink and viable and bilious liquid output draining to gravity bag.  JP with SS output (276ml/24h) GU: clear yellow urine in bag  Lab Results:  Recent Labs    09/02/22 0238 09/03/22 0334  WBC 10.3 8.2  HGB 7.2* 7.5*  HCT 22.5* 22.4*  PLT 149* 150    Recent Labs    09/02/22 0238 09/03/22 0334  NA 133* 132*  K 4.3 4.1  CL 100 101  CO2 24 24  GLUCOSE 159* 106*  BUN 7* 17  CREATININE 0.92 1.11*  CALCIUM 7.7* 7.3*    No results for input(s): "LABPROT", "INR" in the last 72 hours.    Component Value Date/Time   NA 132 (L) 09/03/2022 0334   NA 134 06/01/2022 0848   K 4.1 09/03/2022 0334   CL 101 09/03/2022 0334   CO2 24 09/03/2022 0334   GLUCOSE 106 (H) 09/03/2022 0334   BUN 17 09/03/2022 0334   BUN 7 (L) 06/01/2022 0848   CREATININE 1.11 (H) 09/03/2022 0334   CREATININE 0.93 08/10/2022 0857   CREATININE 0.99 02/05/2013 0757   CALCIUM 7.3 (L) 09/03/2022 0334   PROT 5.4 (L) 08/28/2022 0100   PROT 6.0 06/01/2022 0848    ALBUMIN 2.5 (L) 08/28/2022 0100   ALBUMIN 3.6 (L) 06/01/2022 0848   AST 13 (L) 08/28/2022 0100   AST 12 (L) 08/10/2022 0857   ALT 12 08/28/2022 0100   ALT 11 08/10/2022 0857   ALKPHOS 35 (L) 08/28/2022 0100   BILITOT 0.5 08/28/2022 0100   BILITOT 0.3 08/10/2022 0857   GFRNONAA 51 (L) 09/03/2022 0334   GFRNONAA >60 08/10/2022 0857   GFRNONAA 59 (L) 02/05/2013 0757   GFRAA 61 05/20/2020 0850   GFRAA 68 02/05/2013 0757    Assessment/Plan 77 yo female with colovesical fistula with recurrent UTIs POD 2 s/p  laparoscopic converted to open sigmoid resection with anastomosis, diverting loop ileostomy, take down of colovesical fistula 4/24 Dr. Sheliah Hatch Intraoperative bladder repair - Dr. Laverle Patter  - surgical path pending - hgb 7.5 s/p 1 uprbc 4/25. Continue to trend - JP drain output SS and improving. Cont drain - continue foley and follow up with urology as planned, appreciate their assistance - ileostomy productive - 981ml/24h. Continue gravity bag and trend output. Continue FLD this am and ADAT soft -Continue antibiotics for now for intra-abdominal coverage -Encouraged ambulation and IS use  FEN - FLD adat soft VTE - lovenox ID - rocephin/flagyl. linezolid   LOS: 7 days   I reviewed last 24 h vitals and pain scores, last 48 h intake  and output, last 24 h labs and trends, and last 24 h imaging results.  This care required high  level of medical decision making.   Francena Hanly [facility] Surgery at [facility] 09/03/2022, 7:59 AM Please see Amion for pager number during day hours 7:00am-4:30pm or 7:00am -11:30am on weekends

## 2022-09-03 NOTE — Discharge Instructions (Signed)
MIDLINE WOUND CARE: - midline dressing to be changed daily - supplies: normal saline, kerlix/gauze, scissors, ABD pads, tape  - remove dressing and all packing carefully, moistening with sterile saline as needed to avoid packing/internal dressing sticking to the wound. - clean edges of skin around the wound with water/gauze, making sure there is no tape debris or leakage left on skin that could cause skin irritation or breakdown. - dampen a clean kerlix/gauze with saline and pack wound from wound base to skin level,  Wound can be packed loosely. Trim kerlix to size if a whole kerlix is not required. - cover wound with a dry ABD pad and secure with tape.  - apply any skin protectant/powder recommended by clinician to protect skin/skin folds. - change dressing as needed if leakage occurs, wound gets contaminated, or patient requests to shower. - patient may shower daily with wound open and following the shower the wound should be dried and a clean dressing placed.     CIRUGA: INSTRUCCIONES POSTOP (Ciruga de obstruccin del intestino delgado, reseccin de colon, etc.)   ############################################## ###################  COME Transicin gradual a una dieta alta en fibra con un suplemento de fibra durante los prximos das despus del alta  CAMINAR Camina una hora al da. Controla tu dolor para hacer eso.  CONTROLAR EL DOLOR Controle el dolor para que pueda caminar, dormir, tolerar estornudos/tos, subir/bajar escaleras.  TENGA UNA DEPOSICIN DIARIAMENTE Mantenga sus evacuaciones regulares para evitar problemas. Est bien probar un laxante para anular el estreimiento. Est bien usar un antidiarreico para retrasar la diarrea. Llame si no mejora despus de 2 intentos  LLAME SI TIENE PROBLEMAS/INQUIETUDES Llame si todava tiene problemas a pesar de seguir estas instrucciones. Llame si tiene inquietudes que no han sido respondidas por estas  instrucciones  ############################################## ###################   DIETA Seguir una dieta ligera los primeros das en casa. Comience con una dieta blanda como sopas, lquidos, alimentos ricos en almidn, alimentos bajos en grasa, etc. Si se siente lleno, hinchado o estreido, siga una dieta lquida completa o en pur/licuada durante unos das hasta que se sienta mejor y no ms tiempo estreido. Asegrese de beber muchos lquidos todos los das para evitar deshidratarse (sentirse mareado, no orinar, etc.). Agregue gradualmente un suplemento de fibra a su dieta durante la prxima semana. Vuelva gradualmente a una dieta slida regular. Evite la comida rpida o las comidas pesadas la primera semana, ya que es ms probable que tenga nuseas. Se espera que su tracto digestivo necesite algunos meses para volver a la normalidad. Es comn que sus evacuaciones intestinales y heces sean irregulares. Tendr hinchazn y calambres ocasionales que eventualmente desaparecern. Hasta que est comiendo alimentos slidos normalmente, deje de tomar todos los medicamentos para el dolor y regrese a sus actividades regulares; sus intestinos no sern normales. Concntrese en llevar una dieta baja en grasas y alta en fibra por el resto de su vida (consulte Cmo lograr una buena salud intestinal, a continuacin).  CUIDADO de su INCISIN o HERIDA Es bueno para incisiones cerradas e incluso heridas abiertas para lavarse todos los das. Dchate todos los das. Los baos cortos estn bien. Lave las incisiones y heridas con agua y jabn.  Si tiene una(s) incisin(es) cerrada(s), lvela con agua y jabn todos los das. Puede dejar las incisiones cerradas abiertas al aire si est seco. Puede cubrir la incisin con una gasa limpia y volver a colocarla despus de su ducha diaria para mayor comodidad.  Es bueno lavar las incisiones cerradas e incluso las   heridas abiertas todos los das. Dchate todos los das. Los  baos cortos estn bien. Lave las incisiones y heridas con agua y jabn. Puede dejar las incisiones cerradas abiertas al aire si est seco. Puede cubrir la incisin con una gasa limpia y volver a colocarla despus de su ducha diaria para mayor comodidad.  Si tiene una herida abierta con una aspiradora para heridas, consulte las instrucciones de cuidado de la aspiradora para heridas.   ACTIVIDADES segn tolerancia Inicie actividades diarias livianas (cuidado personal, caminar, subir escaleras) a partir del da posterior a la ciruga. Aumente gradualmente las actividades segn lo tolere. Controla tu dolor para estar activo. Detngase cuando est cansado. Lo ideal es caminar varias veces al da, eventualmente una hora al da. La mayora de las personas regresan a la mayora de las actividades cotidianas en unas pocas semanas. Se necesitan de 4 a 8 semanas para volver a la actividad intensa y sin restricciones. Si puede caminar 30 minutos sin dificultad, es seguro intentar una actividad ms intensa como trotar, caminar en una caminadora, andar en bicicleta, ejercicios aerbicos de bajo impacto, nadar, etc. Guarde la actividad ms intensa y extenuante para el final (por lo general, de 4 a 8 semanas despus de la ciruga), como abdominales, levantar objetos pesados, deportes de contacto, etc. Abstngase de cualquier trabajo pesado intenso o esfuerzo hasta que deje de tomar narcticos para controlar el dolor. Tendr das libres, pero las cosas deberan mejorar semana a semana. NO EMPUJE A TRAVS DEL DOLOR. Deja que el dolor sea tu gua: si te duele hacer algo, no lo hagas. El dolor es tu cuerpo advirtindote que evites esa actividad durante otra semana hasta que el dolorbaja. Puede conducir cuando ya no est tomando medicamentos narcticos recetados para el dolor, puede usar cmodamente el cinturn de seguridad y puede hacer giros/paradas repentinos de manera segura para protegerse sin vacilar debido al  dolor. Puede tener relaciones sexuales cuando le resulte cmodo. Si te duele hacer algo, detente.  MEDICAMENTOS Tome los medicamentos que le recetan normalmente en casa, a menos que le indiquen lo contrario. Anticoagulantes: Por lo general, puede reiniciar cualquier anticoagulante fuerte despus del segundo da posoperatorio. Est bien tomar una aspirina de inmediato.   Si est tomando anticoagulantes fuertes (warfarina/Coumadin, Plavix, Xerelto, Eliquis, Pradaxa, etc.), hable con su cirujano, mdico de atencin primaria y/o cardilogo para obtener instrucciones sobre cundo reiniciar el anticoagulante y si es necesario controlar la sangre. (anlisis de sangre PT/INR, etc.).  CONTROL DE DOLOR El dolor despus de la ciruga o relacionado con la actividad a menudo se debe a una tensin/lesin en el msculo, tendn, nervios y/o incisiones. Este dolor suele ser a corto plazo y mejorar en unos pocos meses. Para ayudar a acelerar el proceso de curacin y volver a la actividad normal ms rpidamente, HAGAN LAS SIGUIENTES COSAS JUNTOS: 1. Aumente la actividad gradualmente. NO EMPUJE A TRAVS DEL DOLOR 2. Usa Hielo y/o Calor 3. Prueba masajes suaves y/o estiramientos 4. Tome analgsicos de venta libre 5. Tome analgsicos recetados con narcticos para el dolor ms intenso  Buen control del dolor = recuperacin ms rpida. Es mejor tomar ms medicamentos para estar ms activo que quedarse en cama todo el da para evitar los medicamentos. 1. Incrementa la actividad gradualmente Evite levantar objetos pesados al principio, luego aumente a levantar segn lo tolere durante las prximas 6 semanas. No "empuje a travs" del dolor. Escucha a tu cuerpo y evita posiciones y maniobras que reproduzcan el dolor. Espera unos das antes de probar   algo ms intenso. Se recomienda caminar una hora al da para ayudar a que su cuerpo se recupere ms rpido y de manera ms segura. Comience lentamente y detngase cuando  sienta dolor. Si puedes caminar 30 minutos sin parar ni sentir dolor, puedes probar con actividades ms intensas (correr, trotar, aerbicos, andar en bicicleta, nadar, caminadora, sexo, deportes, levantamiento de pesas, etc.) Recuerda: si te duele hacerlo, no lo hagas! 2. Usa Hielo y/o Calor Tendr hinchazn y moretones alrededor de las incisiones. Esto tardar varias semanas en resolverse. Bolsas de hielo o almohadillas trmicas (6 a 8 veces al da, 30 a 60 minutos cada vez) ayudarn a aliviar el dolor y los moretones. Algunas personas prefieren usar hielo solo, calor solo o alternar entre hielo y calor. Experimente y vea qu funciona mejor para usted. Considere probar el hielo durante los primeros das para ayudar a disminuir la hinchazn y los moretones; luego, cambie a calor para ayudar a relajar los puntos doloridos y acelerar la recuperacin. Dchate todos los das. Los baos cortos estn bien. Se siente bien! Mantenga las incisiones y heridas limpias con agua y jabn. 3. Prueba masajes suaves y/o estiramientos Masaje en el rea del dolor muchas veces al da Detente si sientes dolor, no te excedas. 4. Tome analgsicos de venta libre Esto ayuda a que los tejidos musculares y nerviosos se vuelvan menos irritables y se calmen ms rpido. Elija UNO de los siguientes medicamentos antiinflamatorios de venta libre: Pastillas de 500 mg de acetaminofn (Tylenol) 1 o 2 pastillas con cada comida y justo antes de acostarse (evtelo si tiene problemas hepticos o si tiene acetaminofn en su receta de narcticos) Tabletas de naproxeno de 220 mg (p. ej., Aleve, Naprosyn) 1 o 2 tabletas dos veces al da (evtela si tiene problemas de rin, estmago, EII o sangrado) Pastillas de 200 mg de ibuprofeno (p. ej., Advil, Motrin) 3 o 4 pastillas con cada comida y justo antes de acostarse (evtelo si tiene problemas de rin, estmago, EII o sangrado) Tmelo con comida/refrigerio varias veces al da segn las  indicaciones durante al menos 2 semanas para ayudar a mantener el dolor/dolor bajo y ms manejable. 5. Tome analgsicos recetados con narcticos para el dolor ms intenso A menudo se le da una receta para un fuerte control del dolor despus del alta (por ejemplo: oxicodona/Percocet, hidrocodona/Norco/Vicodin o tramadol/Ultram) Tome su medicamento para el dolor segn lo prescrito. Tenga en cuenta que la mayora de las recetas de narcticos tambin contienen Tylenol (acetaminofeno); evite tomar demasiado Tylenol. Si tiene problemas/inquietudes con el medicamento recetado (no controla el dolor, las nuseas, los vmitos, el sarpullido, la picazn, etc.), llmenos al [phone] para ver si necesitamos cambiarlo por otro para el dolor. medicamento que funcionar mejor para usted y/o controlar mejor sus efectos secundarios. Si necesita un resurtido de su medicamento para el dolor, debe llamar a la oficina antes de las 4:00 p. m. y solo entre semana. Por ley federal, las recetas de narcticos no se pueden pedir en una farmacia. Deben ser llenados en papel y recogidos en nuestra oficina por el paciente o cuidador autorizado. Las recetas no se pueden surtir despus de las 4 pm ni los fines de semana.  CUANDO LLAMARNOS [phone] Dolor intenso no controlado o que empeora Fiebre superior a 101 F (38,5 C) Inquietudes con la incisin: Empeoramiento del dolor, enrojecimiento, sarpullido/urticaria, hinchazn, sangrado o drenaje Reacciones/problemas con nuevos medicamentos (picazn, sarpullido, urticaria, nuseas, etc.) Nuseas y d/o vmitos Dificultad para orinar Respiracin dificultosa Empeoramiento de la fatiga,   mareos, aturdimiento, visin borrosa Otras preocupaciones Si no mejora despus de dos semanas o nota que empeora, comunquese con nuestra oficina al [phone] para obtener ms consejos. Es posible que necesitemos ajustar sus medicamentos, volver a evaluarlo en el consultorio, enviarlo a la  sala de emergencias o ver qu otras cosas podemos hacer para ayudarlo. El personal de la clnica est disponible para responder sus preguntas durante el horario comercial habitual (8:30 a. m. a 5 p. m.). No dude en llamar y pedir hablar con uno de nuestros enfermeros si tiene inquietudes clnicas. Un cirujano de [facility] est siempre de guardia en los hospitales las 24 horas del da.  Si tiene una emergencia mdica, vaya a la sala de emergencias ms cercana o llame al 911.  SEGUIMIENTO en nuestra oficina El da de su alta del hospital (o el siguiente da laborable de la semana), llame a [facility] para programar o confirmar una cita para ver a su cirujano en el consultorio para una cita de seguimiento. Por lo general, es de 2 a 3 semanas despus de la ciruga. Si tiene grapas para la piel en su(s) incisin(es), infrmele al consultorio para que podamos programar un horario en el consultorio para que la enfermera se las quite (generalmente alrededor de 10 das despus de la ciruga). Asegrese de llamar para programar citas el da del alta (o el siguiente da laborable de la semana) del hospital para garantizar una hora de cita conveniente. SI TIENE FORMULARIOS DE DISCAPACIDAD O LICENCIA FAMILIAR, LLEVARLOS A LA OFICINA PARA PROCESARLOS. NO SE LOS D A SU MDICO.  Ciruga de [facility], [place] [address], [address], [place], [place] [address] ? [phone] - Principal [phone] - Nmero gratuito, [phone] - Fax [email or web address]   LLEGAR A UNA BUENA SALUD INTESTINAL. Se espera que su tracto digestivo necesite algunos meses para volver a la normalidad. Es comn que sus evacuaciones intestinales y heces sean irregulares. Tendr hinchazn y calambres ocasionales que eventualmente desaparecern. Hasta que est comiendo alimentos slidos normalmente, deje de tomar todos los medicamentos para el dolor y regrese a sus actividades  regulares; sus intestinos no sern normales. Evitar el estreimiento El objetivo: UNA EVACUACIN INTESTINAL SUAVE AL DA! Beber mucho lquido. Elige agua primero. TOMA UN SUPLEMENTO DE FIBRA CADA DA EL RESTO DE TU VIDA Durante su primera semana de regreso a casa, agregue gradualmente un suplemento de fibra todos los das. Experimente qu forma puede tolerar. Hay muchas formas, como polvos, tabletas, obleas, gomitas, etc. Salvado de psyllium (Metamucil), metilcelulosa (Citrucel), Miralax o Glycolax, Benefiber, Linaza. Ajuste la dosis semana a semana (1/2 dosis/da a 6 dosis al da) hasta que est defecando 1-2 veces al da. Reduzca la dosis o pruebe con un producto de fibra diferente si le causa problemas como diarrea o distensin abdominal. A veces, se necesita un laxante para ayudar a impulsar los intestinos si est estreido hasta que el suplemento de fibra pueda ayudar a regular sus intestinos. Si tolera comer y se est tirando pedos, est bien probar un laxante suave como la dosis doble de MiraLax, jugo de ciruela pasa o leche de magnesia. Evite el uso de laxantes con demasiada frecuencia. Los ablandadores de heces a veces pueden ayudar a contrarrestar los efectos de estreimiento de los analgsicos narcticos. Tambin puede causar diarrea, as que evite usarlo por mucho tiempo. Si todava est estreido a pesar de tomar fibra diariamente, comer slidos y algunas dosis de laxantes, llame a nuestra oficina. Controlando la diarrea Intente   beber lquidos y comer alimentos blandos durante unos das para evitar estresar an ms sus intestinos. Evite los productos lcteos (especialmente la leche y el helado) por un corto tiempo. Los intestinos a menudo pueden perder la capacidad de digerir la lactosa cuando estn estresados. Evite los alimentos que causan gases o hinchazn. Los alimentos tpicos incluyen frijoles y otras legumbres, repollo, brcoli y productos lcteos. Evite las comidas grasosas,  picantes y rpidas. Cada persona tiene cierta sensibilidad a otros alimentos, as que escuche a su cuerpo y evite aquellos alimentos que le provoquen problemas. Los probiticos (como el yogur activo, Align, etc.) pueden ayudar a repoblar los intestinos y el colon con bacterias normales y calmar un tracto digestivo sensible Agregar un suplemento de fibra gradualmente puede ayudar a espesar las heces al absorber el exceso de lquido y volver a entrenar a los intestinos para que acten con ms normalidad. Aumente lentamente la dosis durante unas pocas semanas. Demasiada fibra demasiado pronto puede ser contraproducente y causar calambres e hinchazn. Est bien tratar de disminuir la diarrea con unas pocas dosis de medicamentos antidiarreicos. El subsalicilato de bismuto (por ejemplo, Kayopectate, Pepto Bismol) en algunas dosis puede ayudar a controlar la diarrea. Evitar si est embarazada. La loperamida (Imodium) puede retrasar la diarrea. Comience con una tableta (2 mg) primero. Evtelo si tiene fiebre o dolor intenso. LOS PACIENTES CON ILEOSTOMA TENDRN DIARREA CRNICA ya que su colon no est en uso. Beba muchos lquidos. Deber beber incluso ms vasos de agua/lquido al da para evitar deshidratarse. Registre el resultado de su ileostoma. Espere vaciar la bolsa cada 3-4 horas al principio. La mayora de las personas con una ileostoma permanente vace su bolsa 4-6 veces como mnimo. Use medicamentos antidiarreicos (especialmente Imodium) varias veces al da para evitar deshidratarse. Comience con una dosis a la hora de acostarse y desayunar. Ajuste hacia arriba o hacia abajo segn sea necesario. Aumente los medicamentos antidiarreicos segn las indicaciones para evitar vaciar la bolsa ms de 8 veces al da (cada 3 horas). Trabaje con su enfermera de ostoma de heridas para aprender a cuidar su ostoma. Consulte las instrucciones de cuidado de la ostoma. RESOLUCIN DE PROBLEMAS DE INTESTINOS  IRREGULARES 1) Comience con una dieta suave y blanda. Nada de comidas picantes, grasosas o fritas. 2) Evite el gluten/trigo o productos lcteos de la dieta para ver si los sntomas mejoran. 3) Miralax 17gm o semillas de lino mezcladas en 8oz. agua o jugo-diariamente. Puede usar 2-4 veces al da segn sea necesario. 4) Gas-X, Phazyme, etc. segn sea necesario para gases e hinchazn. 5) Prilosec (omeprazol) de venta libre segn sea necesario 6) Considere los probiticos (Align, Activa, etc.) para ayudar a calmar los intestinos  Llame a su mdico si est empeorando o no est mejorando. A veces, se pueden necesitar ms pruebas (cultivos, endoscopia, estudios de rayos X, tomografas computarizadas, anlisis de sangre, etc.) para ayudar a diagnosticar y tratar la causa de la diarrea. Ciruga de [facility], [place] [address], [address], [place], [place] [address] [phone] - Principal. [phone] - Llamada gratuita. [phone] - Fax [email or web address]   ##########################################  SURGERY: POST OP INSTRUCTIONS (Surgery for small bowel obstruction, colon resection, etc)   ######################################################################  EAT Gradually transition to a high fiber diet with a fiber supplement over the next few days after discharge  WALK Walk an hour a day.  Control your pain to do that.    CONTROL PAIN Control pain so that you can walk, sleep, tolerate sneezing/coughing, go up/down stairs.    HAVE A BOWEL MOVEMENT DAILY Keep your bowels regular to avoid problems.  OK to try a laxative to override constipation.  OK to use an antidairrheal to slow down diarrhea.  Call if not better after 2 tries  CALL IF YOU HAVE PROBLEMS/CONCERNS Call if you are still struggling despite following these instructions. Call if you have concerns not answered by these  instructions  ######################################################################   DIET Follow a light diet the first few days at home.  Start with a bland diet such as soups, liquids, starchy foods, low fat foods, etc.  If you feel full, bloated, or constipated, stay on a ful liquid or pureed/blenderized diet for a few days until you feel better and no longer constipated. Be sure to drink plenty of fluids every day to avoid getting dehydrated (feeling dizzy, not urinating, etc.). Gradually add a fiber supplement to your diet over the next week.  Gradually get back to a regular solid diet.  Avoid fast food or heavy meals the first week as you are more likely to get nauseated. It is expected for your digestive tract to need a few months to get back to normal.  It is common for your bowel movements and stools to be irregular.  You will have occasional bloating and cramping that should eventually fade away.  Until you are eating solid food normally, off all pain medications, and back to regular activities; your bowels will not be normal. Focus on eating a low-fat, high fiber diet the rest of your life (See Getting to Good Bowel Health, below).  CARE of your INCISION or WOUND  It is good for closed incisions and even open wounds to be washed every day.  Shower every day.  Short baths are fine.  Wash the incisions and wounds clean with soap & water.    You may leave closed incisions open to air if it is dry.   You may cover the incision with clean gauze & replace it after your daily shower for comfort.  If you have an open wound with a wound vac, see wound vac care instructions.    ACTIVITIES as tolerated Start light daily activities --- self-care, walking, climbing stairs-- beginning the day after surgery.  Gradually increase activities as tolerated.  Control your pain to be active.  Stop when you are tired.  Ideally, walk several times a day, eventually an hour a day.   Most people are back  to most day-to-day activities in a few weeks.  It takes 4-8 weeks to get back to unrestricted, intense activity. If you can walk 30 minutes without difficulty, it is safe to try more intense activity such as jogging, treadmill, bicycling, low-impact aerobics, swimming, etc. Save the most intensive and strenuous activity for last (Usually 4-8 weeks after surgery) such as sit-ups, heavy lifting, contact sports, etc.  Refrain from any intense heavy lifting or straining until you are off narcotics for pain control.  You will have off days, but things should improve week-by-week. DO NOT PUSH THROUGH PAIN.  Let pain be your guide: If it hurts to do something, don't do it.  Pain is your body warning you to avoid that activity for another week until the pain goes down. You may drive when you are no longer taking narcotic prescription pain medication, you can comfortably wear a seatbelt, and you can safely make sudden turns/stops to protect yourself without hesitating due to pain. You may have sexual intercourse when it is comfortable. If it hurts to   do something, stop.  MEDICATIONS Take your usually prescribed home medications unless otherwise directed.   Blood thinners:  Usually you can restart any strong blood thinners after the second postoperative day.  It is OK to take aspirin right away.     If you are on strong blood thinners (warfarin/Coumadin, Plavix, Xerelto, Eliquis, Pradaxa, etc), discuss with your surgeon, medicine PCP, and/or cardiologist for instructions on when to restart the blood thinner & if blood monitoring is needed (PT/INR blood check, etc).     PAIN CONTROL Pain after surgery or related to activity is often due to strain/injury to muscle, tendon, nerves and/or incisions.  This pain is usually short-term and will improve in a few months.  To help speed the process of healing and to get back to regular activity more quickly, DO THE FOLLOWING THINGS TOGETHER: Increase activity gradually.   DO NOT PUSH THROUGH PAIN Use Ice and/or Heat Try Gentle Massage and/or Stretching Take over the counter pain medication Take Narcotic prescription pain medication for more severe pain  Good pain control = faster recovery.  It is better to take more medicine to be more active than to stay in bed all day to avoid medications.  Increase activity gradually Avoid heavy lifting at first, then increase to lifting as tolerated over the next 6 weeks. Do not "push through" the pain.  Listen to your body and avoid positions and maneuvers than reproduce the pain.  Wait a few days before trying something more intense Walking an hour a day is encouraged to help your body recover faster and more safely.  Start slowly and stop when getting sore.  If you can walk 30 minutes without stopping or pain, you can try more intense activity (running, jogging, aerobics, cycling, swimming, treadmill, sex, sports, weightlifting, etc.) Remember: If it hurts to do it, then don't do it! Use Ice and/or Heat You will have swelling and bruising around the incisions.  This will take several weeks to resolve. Ice packs or heating pads (6-8 times a day, 30-60 minutes at a time) will help sooth soreness & bruising. Some people prefer to use ice alone, heat alone, or alternate between ice & heat.  Experiment and see what works best for you.  Consider trying ice for the first few days to help decrease swelling and bruising; then, switch to heat to help relax sore spots and speed recovery. Shower every day.  Short baths are fine.  It feels good!  Keep the incisions and wounds clean with soap & water.   Try Gentle Massage and/or Stretching Massage at the area of pain many times a day Stop if you feel pain - do not overdo it Take over the counter pain medication This helps the muscle and nerve tissues become less irritable and calm down faster Choose ONE of the following over-the-counter anti-inflammatory medications: Acetaminophen  500mg tabs (Tylenol) 1-2 pills with every meal and just before bedtime (avoid if you have liver problems or if you have acetaminophen in you narcotic prescription) Naproxen 220mg tabs (ex. Aleve, Naprosyn) 1-2 pills twice a day (avoid if you have kidney, stomach, IBD, or bleeding problems) Ibuprofen 200mg tabs (ex. Advil, Motrin) 3-4 pills with every meal and just before bedtime (avoid if you have kidney, stomach, IBD, or bleeding problems) Take with food/snack several times a day as directed for at least 2 weeks to help keep pain / soreness down & more manageable. Take Narcotic prescription pain medication for more severe pain A prescription for   strong pain control is often given to you upon discharge (for example: oxycodone/Percocet, hydrocodone/Norco/Vicodin, or tramadol/Ultram) Take your pain medication as prescribed. Be mindful that most narcotic prescriptions contain Tylenol (acetaminophen) as well - avoid taking too much Tylenol. If you are having problems/concerns with the prescription medicine (does not control pain, nausea, vomiting, rash, itching, etc.), please call us [phone] to see if we need to switch you to a different pain medicine that will work better for you and/or control your side effects better. If you need a refill on your pain medication, you must call the office before 4 pm and on weekdays only.  By federal law, prescriptions for narcotics cannot be called into a pharmacy.  They must be filled out on paper & picked up from our office by the patient or authorized caretaker.  Prescriptions cannot be filled after 4 pm nor on weekends.    WHEN TO CALL US [phone] Severe uncontrolled or worsening pain  Fever over 101 F (38.5 C) Concerns with the incision: Worsening pain, redness, rash/hives, swelling, bleeding, or drainage Reactions / problems with new medications (itching, rash, hives, nausea, etc.) Nausea and/or vomiting Difficulty urinating Difficulty  breathing Worsening fatigue, dizziness, lightheadedness, blurred vision Other concerns If you are not getting better after two weeks or are noticing you are getting worse, contact our office [phone] for further advice.  We may need to adjust your medications, re-evaluate you in the office, send you to the emergency room, or see what other things we can do to help. The clinic staff is available to answer your questions during regular business hours (8:30am-5pm).  Please don't hesitate to call and ask to speak to one of our nurses for clinical concerns.    A surgeon from [facility] is always on call at the hospitals 24 hours/day If you have a medical emergency, go to the nearest emergency room or call 911.  FOLLOW UP in our office One the day of your discharge from the hospital (or the next business weekday), please call [facility] to set up or confirm an appointment to see your surgeon in the office for a follow-up appointment.  Usually it is 2-3 weeks after your surgery.   If you have skin staples at your incision(s), let the office know so we can set up a time in the office for the nurse to remove them (usually around 10 days after surgery). Make sure that you call for appointments the day of discharge (or the next business weekday) from the hospital to ensure a convenient appointment time. IF YOU HAVE DISABILITY OR FAMILY LEAVE FORMS, BRING THEM TO THE OFFICE FOR PROCESSING.  DO NOT GIVE THEM TO YOUR DOCTOR.  [facility] [address], [address], [place], [place]  [address] ? [phone] - Main [phone] - Toll Free,  [phone] - Fax [email or web address]    GETTING TO GOOD BOWEL HEALTH. It is expected for your digestive tract to need a few months to get back to normal.  It is common for your bowel movements and stools to be irregular.  You will have occasional bloating and cramping that should eventually fade  away.  Until you are eating solid food normally, off all pain medications, and back to regular activities; your bowels will not be normal.   Avoiding constipation The goal: ONE SOFT BOWEL MOVEMENT A DAY!    Drink plenty of fluids.  Choose water first. TAKE A FIBER SUPPLEMENT EVERY DAY   THE REST OF YOUR LIFE During your first week back home, gradually add back a fiber supplement every day Experiment which form you can tolerate.   There are many forms such as powders, tablets, wafers, gummies, etc Psyllium bran (Metamucil), methylcellulose (Citrucel), Miralax or Glycolax, Benefiber, Flax Seed.  Adjust the dose week-by-week (1/2 dose/day to 6 doses a day) until you are moving your bowels 1-2 times a day.  Cut back the dose or try a different fiber product if it is giving you problems such as diarrhea or bloating. Sometimes a laxative is needed to help jump-start bowels if constipated until the fiber supplement can help regulate your bowels.  If you are tolerating eating & you are farting, it is okay to try a gentle laxative such as double dose MiraLax, prune juice, or Milk of Magnesia.  Avoid using laxatives too often. Stool softeners can sometimes help counteract the constipating effects of narcotic pain medicines.  It can also cause diarrhea, so avoid using for too long. If you are still constipated despite taking fiber daily, eating solids, and a few doses of laxatives, call our office. Controlling diarrhea Try drinking liquids and eating bland foods for a few days to avoid stressing your intestines further. Avoid dairy products (especially milk & ice cream) for a short time.  The intestines often can lose the ability to digest lactose when stressed. Avoid foods that cause gassiness or bloating.  Typical foods include beans and other legumes, cabbage, broccoli, and dairy foods.  Avoid greasy, spicy, fast foods.  Every person has some sensitivity to other foods, so listen to your body and avoid those  foods that trigger problems for you. Probiotics (such as active yogurt, Align, etc) may help repopulate the intestines and colon with normal bacteria and calm down a sensitive digestive tract Adding a fiber supplement gradually can help thicken stools by absorbing excess fluid and retrain the intestines to act more normally.  Slowly increase the dose over a few weeks.  Too much fiber too soon can backfire and cause cramping & bloating. It is okay to try and slow down diarrhea with a few doses of antidiarrheal medicines.   Bismuth subsalicylate (ex. Kayopectate, Pepto Bismol) for a few doses can help control diarrhea.  Avoid if pregnant.   Loperamide (Imodium) can slow down diarrhea.  Start with one tablet (2mg) first.  Avoid if you are having fevers or severe pain.  ILEOSTOMY PATIENTS WILL HAVE CHRONIC DIARRHEA since their colon is not in use.    Drink plenty of liquids.  You will need to drink even more glasses of water/liquid a day to avoid getting dehydrated. Record output from your ileostomy.  Expect to empty the bag every 3-4 hours at first.  Most people with a permanent ileostomy empty their bag 4-6 times at the least.   Use antidiarrheal medicine (especially Imodium) several times a day to avoid getting dehydrated.  Start with a dose at bedtime & breakfast.  Adjust up or down as needed.  Increase antidiarrheal medications as directed to avoid emptying the bag more than 8 times a day (every 3 hours). Work with your wound ostomy nurse to learn care for your ostomy.  See ostomy care instructions. TROUBLESHOOTING IRREGULAR BOWELS 1) Start with a soft & bland diet. No spicy, greasy, or fried foods.  2) Avoid gluten/wheat or dairy products from diet to see if symptoms improve. 3) Miralax 17gm or flax seed mixed in 8oz. water or juice-daily. May use 2-4 times a   day as needed. 4) Gas-X, Phazyme, etc. as needed for gas & bloating.  5) Prilosec (omeprazole) over-the-counter as needed 6)  Consider  probiotics (Align, Activa, etc) to help calm the bowels down  Call your doctor if you are getting worse or not getting better.  Sometimes further testing (cultures, endoscopy, X-ray studies, CT scans, bloodwork, etc.) may be needed to help diagnose and treat the cause of the diarrhea. [facility] [address], [address], [place], [place]  [address] [phone] - Main.    [phone]  - Toll Free.   [phone] - Fax [email or web address]   #######################################################  Ostomy Support Information  You've heard that people get along just fine with only one of their eyes, or one of their lungs, or one of their kidneys. But you also know that you have only one intestine and only one bladder, and that leaves you feeling awfully empty, both physically and emotionally: You think no other people go around without part of their intestine with the ends of their intestines sticking out through their abdominal walls.   YOU ARE NOT ALONE.  There are nearly three quarters of a million people in the [place] who have an ostomy; people who have had surgery to remove all or part of their colons or bladders.   There is even a national association, the [employer] with over 350 local affiliated support groups that are organized by volunteers who provide peer support and counseling. [employer] has a toll free telephone num-ber, 800-826-0826 and an educational, interactive website, www.ostomy.org   An ostomy is an opening in the belly (abdominal wall) made by surgery. Ostomates are people who have had this procedure. The opening (stoma) allows the kidney or bowel to grdischarge waste. An external pouch covers the stoma to collect waste. Pouches are are a simple bag and are odor free. Different companies have disposable or reusable pouches to fit one's lifestyle. An ostomy can either be temporary or permanent.   THERE ARE THREE MAIN  TYPES OF OSTOMIES Colostomy. A colostomy is a surgically created opening in the large intestine (colon). Ileostomy. An ileostomy is a surgically created opening in the small intestine. Urostomy. A urostomy is a surgically created opening to divert urine away from the bladder.  OSTOMY Care  The following guidelines will make care of your colostomy easier. Keep this information close by for quick reference.  Helpful DIET hints Eat a well-balanced diet including vegetables and fresh fruits. Eat on a regular schedule.  Drink at least 6 to 8 glasses of fluids daily. Eat slowly in a relaxed atmosphere. Chew your food thoroughly. Avoid chewing gum, smoking, and drinking from a straw. This will help decrease the amount of air you swallow, which may help reduce gas. Eating yogurt or drinking buttermilk may help reduce gas.  To control gas at night, do not eat after 8 p.m. This will give your bowel time to quiet down before you go to bed.  If gas is a problem, you can purchase Beano. Sprinkle Beano on the first bite of food before eating to reduce gas. It has no flavor and should not change the taste of your food. You can buy Beano over the counter at your local drugstore.  Foods like fish, onions, garlic, broccoli, asparagus, and cabbage produce odor. Although your pouch is odor-proof, if you eat these foods you may notice a stronger odor when emptying your pouch. If this is a concern, you may want to   limit these foods in your diet.  If you have an ileostomy, you will have chronic diarrhea & need to drink more liquids to avoid getting dehydrated.  Consider antidiarrheal medicine like imodium (loperamide) or Lomotil to help slow down bowel movements / diarrhea into your ileostomy bag.  GETTING TO GOOD BOWEL HEALTH WITH AN ILEOSTOMY    With the colon bypassed & not in use, you will have small bowel diarrhea.   It is important to thicken & slow your bowel movements down.   The goal: 4-6 small BOWEL  MOVEMENTS A DAY It is important to drink plenty of liquids to avoid getting dehydrated  CONTROLLING ILEOSTOMY DIARRHEA  TAKE A FIBER SUPPLEMENT (FiberCon or Benefiner soluble fiber) twice a day - to thicken stools by absorbing excess fluid and retrain the intestines to act more normally.  Slowly increase the dose over a few weeks.  Too much fiber too soon can backfire and cause cramping & bloating.  TAKE AN IRON SUPPLEMENT twice a day to naturally constipate your bowels.  Usually ferrous sulfate 325mg twice a day)  TAKE ANTI-DIARRHEAL MEDICINES: Loperamide (Imodium) can slow down diarrhea.  Start with two tablets (= 4mg) first and then try one tablet every 6 hours.  Can go up to 2 pills four times day (8 pills of 2mg max) Avoid if you are having fevers or severe pain.  If you are not better or start feeling worse, stop all medicines and call your doctor for advice LoMotil (Diphenoxylate / Atropine) is another medicine that can constipate & slow down bowel moevements Pepto Bismol (bismuth) can gently thicken bowels as well  If diarrhea is worse,: drink plenty of liquids and try simpler foods for a few days to avoid stressing your intestines further. Avoid dairy products (especially milk & ice cream) for a short time.  The intestines often can lose the ability to digest lactose when stressed. Avoid foods that cause gassiness or bloating.  Typical foods include beans and other legumes, cabbage, broccoli, and dairy foods.  Every person has some sensitivity to other foods, so listen to our body and avoid those foods that trigger problems for you.Call your doctor if you are getting worse or not better.  Sometimes further testing (cultures, endoscopy, X-ray studies, bloodwork, etc) may be needed to help diagnose and treat the cause of the diarrhea. Take extra anti-diarrheal medicines (maximum is 8 pills of 2mg loperamide a day)   Tips for POUCHING an OSTOMY   Changing Your Pouch The best time to  change your pouch is in the morning, before eating or drinking anything. Your stoma can function at any time, but it will function more after eating or drinking.   Applying the pouching system  Place all your equipment close at hand before removing your pouch.  Wash your hands.  Stand or sit in front of a mirror. Use the position that works best for you. Remember that you must keep the skin around the stoma wrinkle-free for a good seal.  Gently remove the used pouch (1-piece system) or the pouch and old wafer (2-piece system). Empty the pouch into the toilet. Save the closure clip to use again.  Wash the stoma itself and the skin around the stoma. Your stoma may bleed a little when being washed. This is normal. Rinse and pat dry. You may use a wash cloth or soft paper towels (like Bounty), mild soap (like Dial, Safeguard, or Ivory), and water. Avoid soaps that contain perfumes or lotions.    For a new pouch (1-piece system) or a new wafer (2-piece system), measure your stoma using the stoma guide in each box of supplies.  Trace the shape of your stoma onto the back of the new pouch or the back of the new wafer. Cut out the opening. Remove the paper backing and set it aside.  Optional: Apply a skin barrier powder to surrounding skin if it is irritated (bare or weeping), and dust off the excess. Optional: Apply a skin-prep wipe (such as Skin Prep or All-Kare) to the skin around the stoma, and let it dry. Do not apply this solution if the skin is irritated (red, tender, or broken) or if you have shaved around the stoma. Optional: Apply a skin barrier paste (such as Stomahesive, Coloplast, or Premium) around the opening cut in the back of the pouch or wafer. Allow it to dry for 30 to 60 seconds.  Hold the pouch (1-piece system) or wafer (2-piece system) with the sticky side toward your body. Make sure the skin around the stoma is wrinkle-free. Center the opening on the stoma, then press firmly to  your abdomen (Fig. 4). Look in the mirror to check if you are placing the pouch, or wafer, in the right position. For a 2-piece system, snap the pouch onto the wafer. Make sure it snaps into place securely.  Place your hand over the stoma and the pouch or wafer for about 30 seconds. The heat from your hand can help the pouch or wafer stick to your skin.  Add deodorant (such as Super Banish or Nullo) to your pouch. Other options include food extracts such as vanilla oil and peppermint extract. Add about 10 drops of the deodorant to the pouch. Then apply the closure clamp. Note: Do not use toxic  chemicals or commercial cleaning agents in your pouch. These substances may harm the stoma.  Optional: For extra seal, apply tape to all 4 sides around the pouch or wafer, as if you were framing a picture. You may use any brand of medical adhesive tape. Change your pouch every 5 to 7 days. Change it immediately if a leak occurs.  Wash your hands afterwards.  If you are wearing a 2-piece system, you may use 2 new pouches per week and alternate them. Rinse the pouch with mild soap and warm water and hang it to dry for the next day. Apply the fresh pouch. Alternate the 2 pouches like this for a week. After a week, change the wafer and begin with 2 new pouches. Place the old pouches in a plastic bag, and put them in the trash.   LIVING WITH AN OSTOMY  Emptying Your Pouch Empty your pouch when it is one-third full (of urine, stool, and/or gas). If you wait until your pouch is fuller than this, it will be more difficult to empty and more noticeable. When you empty your pouch, either put toilet paper in the toilet bowl first, or flush the toilet while you empty the pouch. This will reduce splashing. You can empty the pouch between your legs or to one side while sitting, or while standing or stooping. If you have a 2-piece system, you can snap off the pouch to empty it. Remember that your stoma may function during  this time. If you wish to rinse your pouch after you empty it, a turkey baster can be helpful. When using a baster, squirt water up into the pouch through the opening at the bottom. With a 2-piece system,   you can snap off the pouch to rinse it. After rinsing  your pouch, empty it into the toilet. When rinsing your pouch at home, put a few granules of Dreft soap in the rinse water. This helps lubricate and freshen your pouch. The inside of your pouch can be sprayed with non-stick cooking oil (Pam spray). This may help reduce stool sticking to the inside of the pouch.  Bathing You may shower or bathe with your pouch on or off. Remember that your stoma may function during this time.  The materials you use to wash your stoma and the skin around it should be clean, but they do not need to be sterile.  Wearing Your Pouch During hot weather, or if you perspire a lot in general, wear a cover over your pouch. This may prevent a rash on your skin under the pouch. Pouch covers are sold at ostomy supply stores. Wear the pouch inside your underwear for better support. Watch your weight. Any gain or loss of 10 to 15 pounds or more can change the way your pouch fits.  Going Away From Home A collapsible cup (like those that come in travel kits) or a soft plastic squirt bottle with a pull-up top (like a travel bottle for shampoo) can be used for rinsing your pouch when you are away from home. Tilt the opening of the pouch at an upward angle when using a cup to rinse.  Carry wet wipes or extra tissues to use in public bathrooms.  Carry an extra pouching system with you at all times.  Never keep ostomy supplies in the glove compartment of your car. Extreme heat or cold can damage the skin barriers and adhesive wafers on the pouch.  When you travel, carry your ostomy supplies with you at all times. Keep them within easy reach. Do not pack ostomy supplies in baggage that will be checked or otherwise separated  from you, because your baggage might be lost. If you're traveling out of the country, it is helpful to have a letter stating that you are carrying ostomy supplies as a medical necessity.  If you need ostomy supplies while traveling, look in the yellow pages of the telephone book under "Surgical Supplies." Or call the local ostomy organization to find out where supplies are available.  Do not let your ostomy supplies get low. Always order new pouches before you use the last one.  Reducing Odor Limit foods such as broccoli, cabbage, onions, fish, and garlic in your diet to help reduce odor. Each time you empty your pouch, carefully clean the opening of the pouch, both inside and outside, with toilet paper. Rinse your pouch 1 or 2 times daily after you empty it (see directions for emptying your pouch and going away from home). Add deodorant (such as Super Banish or Nullo) to your pouch. Use air deodorizers in your bathroom. Do not add aspirin to your pouch. Even though aspirin can help prevent odor, it could cause ulcers on your stoma.  When to call the doctor Call the doctor if you have any of the following symptoms: Purple, black, or white stoma Severe cramps lasting more than 6 hours Severe watery discharge from the stoma lasting more than 6 hours No output from the colostomy for 3 days Excessive bleeding from your stoma Swelling of your stoma to more than 1/2-inch larger than usual Pulling inward of your stoma below skin level Severe skin irritation or deep ulcers Bulging or other changes in your abdomen    When to call your ostomy nurse Call your ostomy/enterostomal therapy (WOCN) nurse if any of the following occurs: Frequent leaking of your pouching system Change in size or appearance of your stoma, causing discomfort or problems with your pouch Skin rash or rawness Weight gain or loss that causes problems with your pouch     FREQUENTLY ASKED QUESTIONS   Why haven't you met  any of these folks who have an ostomy?  Well, maybe you have! You just did not recognize them because an ostomy doesn't show. It can be kept secret if you wish. Why, maybe some of your best friends, office associates or neighbors have an ostomy ... you never can tell. People facing ostomy surgery have many quality-of-life questions like: Will you bulge? Smell? Make noises? Will you feel waste leaving your body? Will you be a captive of the toilet? Will you starve? Be a social outcast? Get/stay married? Have babies? Easily bathe, go swimming, bend over?  OK, let's look at what you can expect:   Will you bulge?  Remember, without part of the intestine or bladder, and its contents, you should have a flatter tummy than before. You can expect to wear, with little exception, what you wore before surgery ... and this in-cludes tight clothing and bathing suits.   Will you smell?  Today, thanks to modern odor proof pouching systems, you can walk into an ostomy support group meeting and not smell anything that is foul or offensive. And, for those with an ileostomy or colostomy who are concerned about odor when emptying their pouch, there are in-pouch deodorants that can be used to eliminate any waste odors that may exist.   Will you make noises?  Everyone produces gas, especially if they are an air-swallower. But intestinal sounds that occur from time to time are no differ-ent than a gurgling tummy, and quite often your clothing will muffle any sounds.   Will you feel the waste discharges?  For those with a colostomy or ileostomy there might be a slight pressure when waste leaves your body, but understand that the intestines have no nerve endings, so there will be no unpleasant sensations. Those with a urostomy will probably be unaware of any kidney drainage.   Will you be a captive of the toilet?  Immediately post-op you will spend more time in the bathroom than you will after your body recovers from  surgery. Every person is different, but on average those with an ileostomy or urostomy may empty their pouches 4 to 6 times a day; a little  less if you have a colostomy. The average wear time between pouch system changes is 3 to 5 days and the changing process should take less than 30 minutes.   Will I need to be on a special diet? Most people return to their normal diet when they have recovered from surgery. Be sure to chew your food well, eat a well-balanced diet and drink plenty of fluids. If you experience problems with a certain food, wait a couple of weeks and try it again.  Will there be odor and noises? Pouching systems are designed to be odor-proof or odor-resistant. There are deodorants that can be used in the pouch. Medications are also available to help reduce odor. Limit gas-producing foods and carbonated beverages. You will experience less gas and fewer noises as you heal from surgery.  How much time will it take to care for my ostomy? At first, you may spend a lot of time learning   about your ostomy and how to take care of it. As you become more comfortable and skilled at changing the pouching system, it will take very little time to care for it.   Will I be able to return to work? People with ostomies can perform most jobs. As soon as you have healed from surgery, you should be able to return to work. Heavy lifting (more than 10 pounds) may be discouraged.   What about intimacy? Sexual relationships and intimacy are important and fulfilling aspects of your life. They should continue after ostomy surgery. Intimacy-related concerns should be discussed openly between you and your partner.   Can I wear regular clothing? You do not need to wear special clothing. Ostomy pouches are fairly flat and barely noticeable. Elastic undergarments will not hurt the stoma or prevent the ostomy from functioning.   Can I participate in sports? An ostomy should not limit your involvement in sports.  Many people with ostomies are runners, skiers, swimmers or participate in other active lifestyles. Talk with your caregiver first before doing heavy physical activity.  Will you starve?  Not if you follow doctor's orders at each stage of your post-op adjustment. There is no such thing as an "ostomy diet". Some people with an ostomy will be able to eat and tolerate anything; others may find diffi-culty with some foods. Each person is an individual and must determine, by trial, what is best for them. A good practice for all is to drink plenty of water.   Will you be a social outcast?  Have you met anyone who has an ostomy and is a social outcast? Why should you be the first? Only your attitude and self image will effect how you are treated. No confi-dent person is an outcast.    PROFESSIONAL HELP   Resources are available if you need help or have questions about your ostomy.   Specially trained nurses called Wound, Ostomy Continence Nurses (WOCN) are available for consultation in most major medical centers.  Consider getting an ostomy consult at an outpatient ostomy clinic.   [place] has an Ostomy Clinic run by an WOCN ostomy nurse at the [facility].  [phone]. [facility] can help set up an appointment   The [employer] Ostomy Association (UOA) is a group made up of many local chapters throughout the [place]. These local groups hold meetings and provide support to prospective and existing ostomates. They sponsor educational events and have qualified visitors to make personal or telephone visits. Contact the UOA for the chapter nearest you and for other educational publications.  More detailed information can be found in Colostomy Guide, a publication of the United Ostomy Association (UOA). Contact UOA at 1-800-826-0826 or visit their web site at www.uoaa.org. The website contains links to other sites, suppliers and resources.  [facility] Secure Start  Services: Start at the website to enlist for support.  Your Wound Ostomy (WOCN) nurse may have started this process. [email or web address]/[email or web address] Secure Start services are designed to support people as they live their lives with an ostomy or neurogenic bladder. Enrolling is easy and at no cost to the patient. We realize that each person's needs and life journey are different. Through Secure Start services, we want to help people live their life, their way.  #######################################################   Managing Your Pain After Surgery Without Opioids    Thank you for participating in our program to help patients manage their pain after surgery without opioids. This is part of   our effort to provide you with the best care possible, without exposing you or your family to the risk that opioids pose.  What pain can I expect after surgery? You can expect to have some pain after surgery. This is normal. The pain is typically worse the day after surgery, and quickly begins to get better. Many studies have found that many patients are able to manage their pain after surgery with Over-the-Counter (OTC) medications such as Tylenol and Motrin. If you have a condition that does not allow you to take Tylenol or Motrin, notify your surgical team.  How will I manage my pain? The best strategy for controlling your pain after surgery is around the clock pain control with Tylenol (acetaminophen) and Motrin (ibuprofen or Advil). Alternating these medications with each other allows you to maximize your pain control. In addition to Tylenol and Motrin, you can use heating pads or ice packs on your incisions to help reduce your pain.  How will I alternate your regular strength over-the-counter pain medication? You will take a dose of pain medication every three hours. Start by taking 650 mg of Tylenol (2 pills of 325 mg) 3 hours later take 600 mg of Motrin (3 pills of 200 mg) 3 hours after  taking the Motrin take 650 mg of Tylenol 3 hours after that take 600 mg of Motrin.   - 1 -  See example - if your first dose of Tylenol is at 12:00 PM   12:00 PM Tylenol 650 mg (2 pills of 325 mg)  3:00 PM Motrin 600 mg (3 pills of 200 mg)  6:00 PM Tylenol 650 mg (2 pills of 325 mg)  9:00 PM Motrin 600 mg (3 pills of 200 mg)  Continue alternating every 3 hours   We recommend that you follow this schedule around-the-clock for at least 3 days after surgery, or until you feel that it is no longer needed. Use the table on the last page of this handout to keep track of the medications you are taking. Important: Do not take more than 3000mg of Tylenol or 3200mg of Motrin in a 24-hour period. Do not take ibuprofen/Motrin if you have a history of bleeding stomach ulcers, severe kidney disease, &/or actively taking a blood thinner  What if I still have pain? If you have pain that is not controlled with the over-the-counter pain medications (Tylenol and Motrin or Advil) you might have what we call "breakthrough" pain. You will receive a prescription for a small amount of an opioid pain medication such as Oxycodone, Tramadol, or Tylenol with Codeine. Use these opioid pills in the first 24 hours after surgery if you have breakthrough pain. Do not take more than 1 pill every 4-6 hours.  If you still have uncontrolled pain after using all opioid pills, don't hesitate to call our staff using the number provided. We will help make sure you are managing your pain in the best way possible, and if necessary, we can provide a prescription for additional pain medication.   Day 1    Time  Name of Medication Number of pills taken  Amount of Acetaminophen  Pain Level   Comments  AM PM       AM PM       AM PM       AM PM       AM PM       AM PM       AM PM         AM PM       Total Daily amount of Acetaminophen Do not take more than  3,000 mg per day      Day 2    Time  Name of Medication  Number of pills taken  Amount of Acetaminophen  Pain Level   Comments  AM PM       AM PM       AM PM       AM PM       AM PM       AM PM       AM PM       AM PM       Total Daily amount of Acetaminophen Do not take more than  3,000 mg per day      Day 3    Time  Name of Medication Number of pills taken  Amount of Acetaminophen  Pain Level   Comments  AM PM       AM PM       AM PM       AM PM         AM PM       AM PM       AM PM       AM PM       Total Daily amount of Acetaminophen Do not take more than  3,000 mg per day      Day 4    Time  Name of Medication Number of pills taken  Amount of Acetaminophen  Pain Level   Comments  AM PM       AM PM       AM PM       AM PM       AM PM       AM PM       AM PM       AM PM       Total Daily amount of Acetaminophen Do not take more than  3,000 mg per day      Day 5    Time  Name of Medication Number of pills taken  Amount of Acetaminophen  Pain Level   Comments  AM PM       AM PM       AM PM       AM PM       AM PM       AM PM       AM PM       AM PM       Total Daily amount of Acetaminophen Do not take more than  3,000 mg per day      Day 6    Time  Name of Medication Number of pills taken  Amount of Acetaminophen  Pain Level  Comments  AM PM       AM PM       AM PM       AM PM       AM PM       AM PM       AM PM       AM PM       Total Daily amount of Acetaminophen Do not take more than  3,000 mg per day      Day 7    Time  Name of Medication Number of pills taken  Amount of Acetaminophen  Pain Level   Comments    AM PM       AM PM       AM PM       AM PM       AM PM       AM PM       AM PM       AM PM       Total Daily amount of Acetaminophen Do not take more than  3,000 mg per day        For additional information about how and where to safely dispose of unused opioid medications - https://www.morepowerfulnc.org  Disclaimer: This document contains  information and/or instructional materials adapted from [facility] for the typical patient with your condition. It does not replace medical advice from your health care provider because your experience may differ from that of the typical patient. Talk to your health care provider if you have any questions about this document, your condition or your treatment plan. Adapted from [facility]  

## 2022-09-03 NOTE — Progress Notes (Signed)
Physical Therapy Treatment Patient Details Name: April Weber MRN: [id number] DOB: 1945-09-26 Today's Date: 09/03/2022   History of Present Illness April Weber is a 77 y.o. female s/p laparoscopic converted to open sigmoid resection with anastomosis, diverting loop ileostomy, take down of colovesical fistula 4/24. PMH: osteoarthritis, right breast cancer, stage IIIa CKD,single kidney, history of DVT, dyspnea, hyperlipidemia, hypertension, osteopenia, peripheral vascular disease, seasonal allergies, hypothyroidism    PT Comments    Pt sitting in recliner upon arrival, HR 109, pt motivated to amb further today. Upon standing, pt reports feeling "puny" educated pt on surgery and recovery process. Pt able to perform standing marching of 20 reps with RW support, then standing rest break, then another 20 reps, dyspnea noted, pt on RA with SpO2 100% and HR 137. Pt requesting to return to sitting, limited by fatigue and abdominal pain. Notified RN of elevated HR and fatigue.    Recommendations for follow up therapy are one component of a multi-disciplinary discharge planning process, led by the attending physician.  Recommendations may be updated based on patient status, additional functional criteria and insurance authorization.  Follow Up Recommendations       Assistance Recommended at Discharge Frequent or constant Supervision/Assistance  Patient can return home with the following A little help with walking and/or transfers;A little help with bathing/dressing/bathroom;Assistance with cooking/housework;Help with stairs or ramp for entrance;Assist for transportation   Equipment Recommendations  Other (comment) (TBD)    Recommendations for Other Services       Precautions / Restrictions Precautions Precautions: Fall Precaution Comments: JP drain, ileostomy, foley, monitor HR Restrictions Weight Bearing Restrictions: No     Mobility  Bed Mobility  General bed mobility comments: in  recliner    Transfers Overall transfer level: Needs assistance Equipment used: Rolling walker (2 wheels) Transfers: Sit to/from Stand Sit to Stand: Min guard  General transfer comment: BUE assisting to power up to standing    Ambulation/Gait  General Gait Details: pt reports feeling "puny" when standing, able to perform marching in place x20 reps x2 sets with RW support, no overt LOB and good bil foot clearance, HR up to 137, dyspnea noted, SpO2 100%, pt declines amb due to fatigue, returned to sitting with HR coming back down to 114 within ~2 minutes   Stairs    Wheelchair Mobility    Modified Rankin (Stroke Patients Only)       Balance Overall balance assessment: Needs assistance  Standing balance support: Bilateral upper extremity supported, During functional activity, Reliant on assistive device for balance Standing balance-Leahy Scale: Poor       Cognition Arousal/Alertness: Awake/alert Behavior During Therapy: WFL for tasks assessed/performed Overall Cognitive Status: Within Functional Limits for tasks assessed       Exercises      General Comments        Pertinent Vitals/Pain Pain Assessment Pain Assessment: Faces Faces Pain Scale: Hurts even more Pain Location: abdomen Pain Descriptors / Indicators: Sore, Tender Pain Intervention(s): Limited activity within patient's tolerance, Monitored during session, Repositioned    Home Living                          Prior Function            PT Goals (current goals can now be found in the care plan section) Acute Rehab PT Goals Patient Stated Goal: less pain PT Goal Formulation: With patient Time For Goal Achievement: 09/16/22 Potential to Achieve Goals: Good  Progress towards PT goals: Progressing toward goals    Frequency    Min 1X/week      PT Plan      Co-evaluation              AM-PAC PT "6 Clicks" Mobility   Outcome Measure  Help needed turning from your back to your  side while in a flat bed without using bedrails?: A Little Help needed moving from lying on your back to sitting on the side of a flat bed without using bedrails?: A Little Help needed moving to and from a bed to a chair (including a wheelchair)?: A Little Help needed standing up from a chair using your arms (e.g., wheelchair or bedside chair)?: A Little Help needed to walk in hospital room?: A Little Help needed climbing 3-5 steps with a railing? : A Lot 6 Click Score: 17    End of Session Equipment Utilized During Treatment: Gait belt Activity Tolerance: Patient tolerated treatment well;Patient limited by fatigue Patient left: in chair;with call bell/phone within reach Nurse Communication: Mobility status;Other (comment) (HR up to 137 with marching in place) PT Visit Diagnosis: Other abnormalities of gait and mobility (R26.89);Muscle weakness (generalized) (M62.81);Pain Pain - part of body:  (abdomen)     Time: 1610-9604 PT Time Calculation (min) (ACUTE ONLY): 17 min  Charges:  $Therapeutic Activity: 8-22 mins                      Domenick Bookbinder PT, DPT 09/03/22, 12:36 PM

## 2022-09-03 NOTE — Progress Notes (Signed)
[facility] PROGRESS NOTE  April Weber (DOB: 03-Jun-1945) ZOX:[id number] PCP: Raliegh Ip, DO  Brief Narrative: April Weber is a 77 y.o. female with medical history significant of osteoarthritis, right breast cancer, stage IIIa CKD,, single kidney, history of DVT, dyspnea, hyperlipidemia, hypertension, osteopenia, peripheral vascular disease, seasonal allergies, hypothyroidism who presented to the emergency department complaints of decreased appetite, nausea, lower abdominal pain, constipation and low-grade fever for the past few days.     Patient has known colovesicular and colovaginal fistula discovered earlier this month after recurrent infections.  CT abdomen pelvis without contrast done just 4 days prior to admission shows increased large volume of gas within the bladder.  Of note she just recently completed chemotherapy the first week of April.  As such urology surgery and oncology have been called in consult.  Hospitalist has been called for admission.  Subjective: Feels worn out, tired, no gross bleeding. Denies dyspnea. Wants to get up OOB and sit in chair today.   Objective: BP (!) 151/85 (BP Location: Left Arm)   Pulse 97   Temp 98.4 F (36.9 C) (Oral)   Resp 14   Ht 5\' 6"  (1.676 m)   Wt 71.7 kg   SpO2 99%   BMI 25.50 kg/m   Gen: Elderly female in no distress Pulm: Clear, diminished, nonlabored  CV: RRR, no MRG or pitting edema GI: Soft, appropriately tender without rebound or distention. Hypoactive BS. Thin ostomy output without bleeding.  Neuro: Alert and oriented. No new focal deficits. Ext: Warm, no deformities. Skin: Surgical sites c/d/i with no other rashes, lesions or ulcers on visualized skin   Assessment & Plan: Sepsis secondary to colovesical/colovaginal fistulas, POA:  - now s/p sigmoid resection with anastomosis, diverting loop ileostomy, takedown of colovesical fistula with intraoperative bladder repair by Drs. [name] and Borden  09/01/2022.  - Surgery following. ADAT per surgery > FLD.  - Urology following, sent creatinine lab from drain 4/26. May pursue cystogram 4/29 - Has received ceftriaxone/flagyl x7 days (4/19 - 4/25), linezolid 4/24 - 4/26. Pt is immunocompromised, though WBC remains normal, afebrile, may be able to stop abx. - Continue ostomy teaching - Continue incentive spirometry, OOB. Pt motivated to go home at DC.   ABLA on AOCD:  - s/p 1u PRBCs 4/25, repeat 1u PRBC 4/26 per discussion with heme/onc and pt's ongoing symptoms with insufficient improvement w/initial unit. If worsens still tomorrow, may need imaging?   Stage IIIa CKD:  - Continue monitoring.  - Continues having inadequate po intake, Cr up, will continue IVF   Right ovarian epithelial cancer: Completed chemotherapy first week of April - Follow-up with Dr. Bertis Ruddy as scheduled.  Hypothyroidism: TSH 4.240 - Continue synthroid  HTN:  - Restart home atenolol (may be contributing to tachycardia).   - Norvasc on med list, but pt reportedly not taking. May benefit from this if BP remains elevated.   HLD:  - Restart statin once tolerating po well Tyrone Nine, MD [facility] [email or web address] 09/03/2022, 4:14 PM

## 2022-09-03 NOTE — Care Management Important Message (Signed)
Important Message  Patient Details IM Letter given. Name: HETTIE ROSELLI MRN: [id number] Date of Birth: May 22, 1945   Medicare Important Message Given:  Yes     Caren Macadam 09/03/2022, 11:38 AM

## 2022-09-04 DIAGNOSIS — N321 Vesicointestinal fistula: Secondary | ICD-10-CM | POA: Diagnosis not present

## 2022-09-04 LAB — BASIC METABOLIC PANEL
Anion gap: 8 (ref 5–15)
BUN: 11 mg/dL (ref 8–23)
CO2: 23 mmol/L (ref 22–32)
Calcium: 7.5 mg/dL — ABNORMAL LOW (ref 8.9–10.3)
Chloride: 101 mmol/L (ref 98–111)
Creatinine, Ser: 0.79 mg/dL (ref 0.44–1.00)
GFR, Estimated: >60 mL/min (ref >60)
Glucose, Bld: 94 mg/dL (ref 70–99)
Potassium: 3.9 mmol/L (ref 3.5–5.1)
Sodium: 132 mmol/L — ABNORMAL LOW (ref 135–145)

## 2022-09-04 LAB — CBC
HCT: 25.4 % — ABNORMAL LOW (ref 36.0–46.0)
Hemoglobin: 8.7 g/dL — ABNORMAL LOW (ref 12.0–15.0)
MCH: 32.2 pg (ref 26.0–34.0)
MCHC: 34.3 g/dL (ref 30.0–36.0)
MCV: 94.1 fL (ref 80.0–100.0)
Platelets: 175 10*3/uL (ref 150–400)
RBC: 2.7 MIL/uL — ABNORMAL LOW (ref 3.87–5.11)
RDW: 18.9 % — ABNORMAL HIGH (ref 11.5–15.5)
WBC: 6.1 10*3/uL (ref 4.0–10.5)
nRBC: 0 % (ref 0.0–0.2)

## 2022-09-04 LAB — TYPE AND SCREEN
ABO/RH(D): A POS
Antibody Screen: NEGATIVE

## 2022-09-04 MED ORDER — AMLODIPINE BESYLATE 5 MG PO TABS
5.0000 mg | ORAL_TABLET | Freq: Every day | ORAL | Status: DC
  Administered 2022-09-04: 5 mg via ORAL
  Filled 2022-09-04: qty 1

## 2022-09-04 NOTE — Plan of Care (Addendum)
Received patient in bed awake, alert orientx4. Pain managed with PRN oxycodone and Tylenol. Remains on RA. Tolerating soft diet. Up sitting in chair for most of this shift. Patient ambulated twice in hallway. Safety precautions maintained.

## 2022-09-04 NOTE — Progress Notes (Signed)
Physical Therapy Treatment Patient Details Name: April Weber MRN: [id number] DOB: 03/12/1946 Today's Date: 09/04/2022   History of Present Illness April Weber is a 77 y.o. female s/p laparoscopic converted to open sigmoid resection with anastomosis, diverting loop ileostomy, take down of colovesical fistula 4/24. PMH: osteoarthritis, right breast cancer, stage IIIa CKD,single kidney, history of DVT, dyspnea, hyperlipidemia, hypertension, osteopenia, peripheral vascular disease, seasonal allergies, hypothyroidism    PT Comments    Educated pt on log roll technique in bed, pt elevates HOB and transitions to EOB by slowly mobilizing each LE to EOB, uses hands on bedrail to assist trunk, educated on avoiding breath holding, denies pain, reports she has been sleeping in recliner at home and plans to return to that. Pt powers to stand with good steadiness, BUE assisting as needed, cues to avoid breath holding. Pt amb 36 ft with RW and HR Up to 108 but quickly lowers to 90s with seated rest break, good bil foot clearance, short, slow steps without LOB. After seated rest break, pt able to amb additional 18ft with RW, good steadiness, HR Up to 118 but quickly lowers to 90s with seated rest break. Pt very encouraged with improved amb this date; encouraged to perform additional amb bouts with nursing as able.    Recommendations for follow up therapy are one component of a multi-disciplinary discharge planning process, led by the attending physician.  Recommendations may be updated based on patient status, additional functional criteria and insurance authorization.  Follow Up Recommendations       Assistance Recommended at Discharge Frequent or constant Supervision/Assistance  Patient can return home with the following A little help with walking and/or transfers;A little help with bathing/dressing/bathroom;Assistance with cooking/housework;Help with stairs or ramp for entrance;Assist for  transportation   Equipment Recommendations  Other (comment) (TBD)    Recommendations for Other Services       Precautions / Restrictions Precautions Precautions: Fall Precaution Comments: JP drain, ileostomy, foley, monitor HR Restrictions Weight Bearing Restrictions: No     Mobility  Bed Mobility Overal bed mobility: Needs Assistance Bed Mobility: Supine to Sit  Supine to sit: Min guard  General bed mobility comments: increased time, cues for hand placement, pt declines log roll due to will be sleeping in recliner at home, elevated HOB fully then swings BLE to EOB    Transfers Overall transfer level: Needs assistance Equipment used: Rolling walker (2 wheels) Transfers: Sit to/from Stand Sit to Stand: Min guard  General transfer comment: increased time and effort to power up, cues to avoid breath holding and for hand placement    Ambulation/Gait Ambulation/Gait assistance: Min guard Gait Distance (Feet): 50 Feet (+36) Assistive device: Rolling walker (2 wheels) Gait Pattern/deviations: Step-to pattern, Decreased stride length Gait velocity: decreased  General Gait Details: slow, short steps with RW, good bil foot clearance, no dyspnea, SpO2 100% and HR 108 max noted with ambulating 36 ft with RW; after seated rest break, pt ambulates 50 ft with RW, short steps with good steadiness, HR Up to 118 without dyspnea noted   Stairs             Wheelchair Mobility    Modified Rankin (Stroke Patients Only)       Balance Overall balance assessment: Needs assistance  Standing balance support: Bilateral upper extremity supported, During functional activity, Reliant on assistive device for balance Standing balance-Leahy Scale: Poor         Cognition Arousal/Alertness: Awake/alert Behavior During Therapy: WFL for tasks assessed/performed  Overall Cognitive Status: Within Functional Limits for tasks assessed     Exercises      General Comments General comments  (skin integrity, edema, etc.): Pt on RA with SpO2 100%, HR Up to 108 with initial ambulation then 118 with 2nd ambulation bout, returns to 90s with seated rest break, no dyspnea noted      Pertinent Vitals/Pain Pain Assessment Pain Assessment: Faces Faces Pain Scale: Hurts little more Pain Location: abdomen Pain Descriptors / Indicators: Sore, Tender Pain Intervention(s): Limited activity within patient's tolerance, Monitored during session, Repositioned    Home Living                          Prior Function            PT Goals (current goals can now be found in the care plan section) Acute Rehab PT Goals Patient Stated Goal: less pain PT Goal Formulation: With patient Time For Goal Achievement: 09/16/22 Potential to Achieve Goals: Good Progress towards PT goals: Progressing toward goals    Frequency    Min 1X/week      PT Plan Current plan remains appropriate    Co-evaluation              AM-PAC PT "6 Clicks" Mobility   Outcome Measure  Help needed turning from your back to your side while in a flat bed without using bedrails?: A Little Help needed moving from lying on your back to sitting on the side of a flat bed without using bedrails?: A Little Help needed moving to and from a bed to a chair (including a wheelchair)?: A Little Help needed standing up from a chair using your arms (e.g., wheelchair or bedside chair)?: A Little Help needed to walk in hospital room?: A Little Help needed climbing 3-5 steps with a railing? : A Lot 6 Click Score: 17    End of Session Equipment Utilized During Treatment: Gait belt Activity Tolerance: Patient tolerated treatment well Patient left: in chair;with call bell/phone within reach Nurse Communication: Mobility status PT Visit Diagnosis: Other abnormalities of gait and mobility (R26.89);Muscle weakness (generalized) (M62.81);Pain Pain - part of body:  (abdomen)     Time: 1610-9604 PT Time Calculation  (min) (ACUTE ONLY): 29 min  Charges:  $Gait Training: 8-22 mins $Therapeutic Activity: 8-22 mins                      Tori Bassy Fetterly PT, DPT 09/04/22, 10:44 AM

## 2022-09-04 NOTE — Progress Notes (Signed)
3 Days Post-Op   Subjective/Chief Complaint: Patient tolerating diet.  No nausea or vomiting.  Ostomy working.  No pain.   Objective: Vital signs in last 24 hours: Temp:  [97.6 F (36.4 C)-98.7 F (37.1 C)] 97.9 F (36.6 C) (04/27 0503) Pulse Rate:  [81-103] 85 (04/27 0503) Resp:  [14-18] 16 (04/27 0503) BP: (147-170)/(74-88) 147/82 (04/27 0503) SpO2:  [98 %-100 %] 98 % (04/27 0503) Last BM Date : 09/03/22  Intake/Output from previous day: 04/26 0701 - 04/27 0700 In: 320 [P.O.:220; IV Piggyback:100] Out: 4040 [Urine:2870; Drains:120; Stool:1050] Intake/Output this shift: No intake/output data recorded.   Gen: NAD Resp: nonlabored on room air Abd: soft, appropriate mild TTP across upper abdomen and over incision which is improved from yesterday -incision with honeycomb CDI, lap incision dressing CDI.  Ileostomy stoma pink and viable and bilious liquid output draining to gravity bag.  JP with SS output  Lab Results:  Recent Labs    09/03/22 0334 09/04/22 0442  WBC 8.2 6.1  HGB 7.5* 8.7*  HCT 22.4* 25.4*  PLT 150 175   BMET Recent Labs    09/03/22 0334 09/04/22 0442  NA 132* 132*  K 4.1 3.9  CL 101 101  CO2 24 23  GLUCOSE 106* 94  BUN 17 11  CREATININE 1.11* 0.79  CALCIUM 7.3* 7.5*   PT/INR No results for input(s): "LABPROT", "INR" in the last 72 hours. ABG No results for input(s): "PHART", "HCO3" in the last 72 hours.  Invalid input(s): "PCO2", "PO2"  Studies/Results: No results found.  Anti-infectives: Anti-infectives (From admission, onward)    Start     Dose/Rate Route Frequency Ordered Stop   09/01/22 2200  linezolid (ZYVOX) tablet 600 mg        600 mg Oral Every 12 hours 09/01/22 1255 09/04/22 2159   09/01/22 0600  cefoTEtan (CEFOTAN) 2 g in sodium chloride 0.9 % 100 mL IVPB        2 g 200 mL/hr over 30 Minutes Intravenous On call to O.R. 08/31/22 1204 09/01/22 1244   08/31/22 1400  neomycin (MYCIFRADIN) tablet 1,000 mg       See Hyperspace  for full Linked Orders Report.   1,000 mg Oral 3 times per day 08/31/22 1204 08/31/22 2110   08/31/22 1400  metroNIDAZOLE (FLAGYL) tablet 1,000 mg       See Hyperspace for full Linked Orders Report.   1,000 mg Oral 3 times per day 08/31/22 1204 08/31/22 2109   08/30/22 1415  nitrofurantoin (macrocrystal-monohydrate) (MACROBID) capsule 100 mg  Status:  Discontinued        100 mg Oral Every 12 hours 08/30/22 1329 09/01/22 1255   08/27/22 2200  cefTRIAXone (ROCEPHIN) 2 g in sodium chloride 0.9 % 100 mL IVPB        2 g 200 mL/hr over 30 Minutes Intravenous Every 24 hours 08/27/22 1137     08/27/22 1030  cefTRIAXone (ROCEPHIN) 1 g in sodium chloride 0.9 % 100 mL IVPB  Status:  Discontinued        1 g 200 mL/hr over 30 Minutes Intravenous Every 24 hours 08/27/22 1021 08/27/22 1137   08/27/22 1030  metroNIDAZOLE (FLAGYL) IVPB 500 mg        500 mg 100 mL/hr over 60 Minutes Intravenous Every 12 hours 08/27/22 1021         Assessment/Plan:  77 yo female with colovesical fistula with recurrent UTIs POD 3 s/p  laparoscopic converted to open sigmoid resection with anastomosis, diverting  loop ileostomy, take down of colovesical fistula 4/24 Dr. Sheliah Hatch Intraoperative bladder repair - Dr. Laverle Patter   - 24 Continue gravity bag and trend output.  soft  this am and  -Continue antibiotics for now for intra-abdominal coverage -Encouraged ambulation and IS use   FEN - FLD adat soft VTE - lovenox ID - rocephin/flagyl. linezolid    LOS: 7 days    I reviewed last 24 h vitals and pain scores, last 48 h intake and output, last 24 h labs and trends, and last 24 h imaging results.    LOS: 8 days    Dortha Schwalbe  MD  09/04/2022

## 2022-09-04 NOTE — Progress Notes (Signed)
[facility] PROGRESS NOTE  April Weber (DOB: 01-29-1946) ZOX:[id number] PCP: Raliegh Ip, DO  Brief Narrative: April Weber is a 77 y.o. female with medical history significant of osteoarthritis, right breast cancer, stage IIIa CKD,, single kidney, history of DVT, dyspnea, hyperlipidemia, hypertension, osteopenia, peripheral vascular disease, seasonal allergies, hypothyroidism who presented to the emergency department complaints of decreased appetite, nausea, lower abdominal pain, constipation and low-grade fever for the past few days.     Patient has known colovesicular and colovaginal fistula discovered earlier this month after recurrent infections.  CT abdomen pelvis without contrast done just 4 days prior to admission shows increased large volume of gas within the bladder.  Of note she just recently completed chemotherapy the first week of April.  As such urology surgery and oncology have been called in consult.  Hospitalist has been called for admission.  Subjective: Eating a bit better with limited pain, resting better, wants more PT and ostomy teaching.   Objective: BP (!) 147/82 (BP Location: Left Arm)   Pulse 85   Temp 97.9 F (36.6 C)   Resp 16   Ht 5\' 6"  (1.676 m)   Wt 71.7 kg   SpO2 98%   BMI 25.50 kg/m   Gen: No distress, frail elderly female Pulm: Clear, nonlabored  CV: RRR, no MRG or pitting edema GI: Soft, appropriately mildly tender diffusely, ostomy with thin brown drainage, no exudate or bleeding, JP site c/d/I. +BS Neuro: Alert and oriented. No new focal deficits. Ext: Warm, no deformities. Skin: No other/new rashes, lesions or ulcers on visualized skin   Assessment & Plan: Sepsis secondary to colovesical/colovaginal fistulas, POA:  - now s/p sigmoid resection with anastomosis, diverting loop ileostomy, takedown of colovesical fistula with intraoperative bladder repair by Drs. [name] and Borden 09/01/2022.  - Surgery following. ADAT per  surgery > FLD to soft on 4/27. - Urology following, creatinine from JP is 1, defer to surgery when to pull drain. May pursue cystogram 4/29 - Has received ceftriaxone/flagyl x7 days (4/19 - 4/25), linezolid 4/24 - 4/26. Pt is immunocompromised, though WBC remains normal, afebrile, may be able to stop abx. - Continue ostomy teaching - Continue incentive spirometry, OOB. Pt motivated to go home at DC. Working with PT   ABLA on AOCD:  - s/p 1u PRBCs 4/25, 1u PRBC 4/26. Hgb up to 8.7g/dl with improved exertional symptoms.    Stage IIIa CKD:  - Continue monitoring, Cr improved 4/27.  - PO intake improving, will stop IVF to see if she can fly now that diet is advanced.    Right ovarian epithelial cancer: Completed chemotherapy first week of April - Follow-up with Dr. Bertis Ruddy as scheduled.  Hypothyroidism: TSH 4.240 - Continue synthroid  HTN:  - Restarted home atenolol  - Norvasc on med list, will reorder.  HLD:  - Restart statin once tolerating po well  Tyrone Nine, MD [facility] [email or web address] 09/04/2022, 11:54 AM

## 2022-09-05 DIAGNOSIS — N321 Vesicointestinal fistula: Secondary | ICD-10-CM | POA: Diagnosis not present

## 2022-09-05 LAB — BPAM RBC: Blood Product Expiration Date: 202405202359

## 2022-09-05 LAB — CBC
HCT: 29.2 % — ABNORMAL LOW (ref 36.0–46.0)
Hemoglobin: 9.6 g/dL — ABNORMAL LOW (ref 12.0–15.0)
MCH: 31.1 pg (ref 26.0–34.0)
MCHC: 32.9 g/dL (ref 30.0–36.0)
MCV: 94.5 fL (ref 80.0–100.0)
Platelets: 242 10*3/uL (ref 150–400)
RBC: 3.09 MIL/uL — ABNORMAL LOW (ref 3.87–5.11)
RDW: 18.3 % — ABNORMAL HIGH (ref 11.5–15.5)
WBC: 6.2 10*3/uL (ref 4.0–10.5)
nRBC: 0 % (ref 0.0–0.2)

## 2022-09-05 LAB — TYPE AND SCREEN: Unit division: 0

## 2022-09-05 LAB — BASIC METABOLIC PANEL
Anion gap: 10 (ref 5–15)
BUN: 9 mg/dL (ref 8–23)
CO2: 23 mmol/L (ref 22–32)
Calcium: 7.4 mg/dL — ABNORMAL LOW (ref 8.9–10.3)
Chloride: 98 mmol/L (ref 98–111)
Creatinine, Ser: 0.79 mg/dL (ref 0.44–1.00)
GFR, Estimated: >60 mL/min (ref >60)
Glucose, Bld: 90 mg/dL (ref 70–99)
Potassium: 3.8 mmol/L (ref 3.5–5.1)
Sodium: 131 mmol/L — ABNORMAL LOW (ref 135–145)

## 2022-09-05 MED ORDER — AMLODIPINE BESYLATE 10 MG PO TABS
10.0000 mg | ORAL_TABLET | Freq: Every day | ORAL | Status: DC
  Administered 2022-09-05 – 2022-09-08 (×4): 10 mg via ORAL
  Filled 2022-09-05 (×4): qty 1

## 2022-09-05 MED ORDER — SODIUM CHLORIDE 0.9 % IV SOLN
2.0000 g | INTRAVENOUS | Status: AC
  Administered 2022-09-05: 2 g via INTRAVENOUS
  Filled 2022-09-05: qty 20

## 2022-09-05 MED ORDER — METRONIDAZOLE 500 MG/100ML IV SOLN
500.0000 mg | Freq: Two times a day (BID) | INTRAVENOUS | Status: AC
  Administered 2022-09-05 – 2022-09-06 (×3): 500 mg via INTRAVENOUS
  Filled 2022-09-05 (×3): qty 100

## 2022-09-05 NOTE — Progress Notes (Signed)
[facility] PROGRESS NOTE  April Weber (DOB: [birth year]) QVZ:[id number] PCP: Raliegh Ip, DO  Brief Narrative: April Weber is a 77 y.o. female with medical history significant of osteoarthritis, right breast cancer, stage IIIa CKD,, single kidney, history of DVT, dyspnea, hyperlipidemia, hypertension, osteopenia, peripheral vascular disease, seasonal allergies, hypothyroidism who presented to the emergency department complaints of decreased appetite, nausea, lower abdominal pain, constipation and low-grade fever for the past few days.     Patient has known colovesicular and colovaginal fistula discovered earlier this month after recurrent infections.  CT abdomen pelvis without contrast done just 4 days prior to admission shows increased large volume of gas within the bladder.  Of note she just recently completed chemotherapy the first week of April.  As such urology surgery and oncology have been called in consult.  Hospitalist has been called for admission.  Subjective: Still w/pain and nausea, some leaking from upper part of ostomy dressing now resolved w/patch tape. Ambulated around halls, planning to do so again today.  Objective: BP (!) 159/82 (BP Location: Left Arm)   Pulse 73   Temp 97.8 F (36.6 C)   Resp 14   Ht 5\' 6"  (1.676 m)   Wt 71.7 kg   SpO2 99%   BMI 25.50 kg/m   Gen: No distress, elderly Pulm: Clear, nonlabored  CV: RRR, no MRG or pitting edema GI: Soft, tender without distention or guarding.  Neuro: Alert and oriented. No new focal deficits. Ext: Warm, no deformities. Skin: Abdominal incisions c/d/I, light brown ostomy output with pink mucosa and no discharge/bleeding. No other/new rashes, lesions or ulcers on visualized skin   Assessment & Plan: Sepsis secondary to colovesical/colovaginal fistulas, POA:  - now s/p sigmoid resection with anastomosis, diverting loop ileostomy, takedown of colovesical fistula with intraoperative bladder repair  by Drs. [name] and Borden 09/01/2022.  - Surgery following. ADAT per surgery > soft on 4/27. Push po intake as tolerated. - Urology following, creatinine from JP is 1, defer to surgery when to pull drain. May pursue cystogram 4/29 - Has received ceftriaxone/flagyl x7 days (4/19 - 4/25), linezolid 4/24 - 4/26. Pt is immunocompromised, though WBC remains normal, afebrile. Will conclude antibiotic therapy at 10 days. - Continue ostomy teaching, needs this here and with home health at discharge.  - Continue incentive spirometry, OOB. Pt motivated to go home at DC. Working with PT   ABLA on AOCD:  - s/p 1u PRBCs 4/25, 1u PRBC 4/26. Hgb up to 9.6g/dl with improved exertional symptoms.    Stage IIIa CKD:  - Continue monitoring, Cr improved 4/27.  - PO intake improving, will stop IVF to see if she can fly now that diet is advanced.    Right ovarian epithelial cancer: Completed chemotherapy first week of April - Follow-up with Dr. Bertis Ruddy as scheduled.  Hypothyroidism: TSH 4.240 - Continue synthroid  HTN:  - Restarted home atenolol  - Norvasc on med list, reordered, BP still elevated, increase dose.  HLD:  - Restart statin once tolerating po well  Tyrone Nine, MD [facility] [email or web address] 09/05/2022, 9:13 AM

## 2022-09-05 NOTE — Plan of Care (Signed)
  Problem: Education: Goal: Knowledge of General Education information will improve Description: Including pain rating scale, medication(s)/side effects and non-pharmacologic comfort measures Outcome: Progressing   Problem: Health Behavior/Discharge Planning: Goal: Ability to manage health-related needs will improve Outcome: Progressing   Problem: Clinical Measurements: Goal: Ability to maintain clinical measurements within normal limits will improve Outcome: Progressing Goal: Will remain free from infection Outcome: Progressing Goal: Diagnostic test results will improve Outcome: Progressing Goal: Respiratory complications will improve Outcome: Progressing Goal: Cardiovascular complication will be avoided Outcome: Progressing   Problem: Activity: Goal: Risk for activity intolerance will decrease Outcome: Progressing   Problem: Nutrition: Goal: Adequate nutrition will be maintained Outcome: Progressing   Problem: Coping: Goal: Level of anxiety will decrease Outcome: Progressing   Problem: Elimination: Goal: Will not experience complications related to bowel motility Outcome: Progressing Goal: Will not experience complications related to urinary retention Outcome: Progressing   Problem: Pain Managment: Goal: General experience of comfort will improve Outcome: Progressing   Problem: Safety: Goal: Ability to remain free from injury will improve Outcome: Progressing   Problem: Skin Integrity: Goal: Risk for impaired skin integrity will decrease Outcome: Progressing   Problem: Education: Goal: Understanding of discharge needs will improve Outcome: Progressing Goal: Verbalization of understanding of the causes of altered bowel function will improve Outcome: Progressing   Problem: Activity: Goal: Ability to tolerate increased activity will improve Outcome: Progressing   Problem: Bowel/Gastric: Goal: Gastrointestinal status for postoperative course will  improve Outcome: Progressing   Problem: Health Behavior/Discharge Planning: Goal: Identification of community resources to assist with postoperative recovery needs will improve Outcome: Progressing   Problem: Nutritional: Goal: Will attain and maintain optimal nutritional status will improve Outcome: Progressing   Problem: Clinical Measurements: Goal: Postoperative complications will be avoided or minimized Outcome: Progressing   Problem: Respiratory: Goal: Respiratory status will improve Outcome: Progressing   Problem: Skin Integrity: Goal: Will show signs of wound healing Outcome: Progressing   

## 2022-09-05 NOTE — Progress Notes (Signed)
4 Days Post-Op   Subjective/Chief Complaint: Soft diet, not very hungry but trying to eat.  No pain   Objective: Vital signs in last 24 hours: Temp:  [97.7 F (36.5 C)-98.4 F (36.9 C)] 97.8 F (36.6 C) (04/28 0517) Pulse Rate:  [73-80] 73 (04/28 0517) Resp:  [14-17] 14 (04/28 0517) BP: (158-171)/(82-89) 159/82 (04/28 0517) SpO2:  [99 %-100 %] 99 % (04/28 0517) Last BM Date : 09/04/22  Intake/Output from previous day: 04/27 0701 - 04/28 0700 In: 2247.3 [P.O.:480; I.V.:1367.3; IV Piggyback:400] Out: 4705 [Urine:3000; Drains:405; Stool:1300] Intake/Output this shift: No intake/output data recorded.   Gen: NAD Resp: nonlabored on room air Abd: soft, appropriate mild TTP across upper abdomen and over incision which is improved from yesterday -incision with honeycomb CDI, lap incision dressing CDI.  Ileostomy stoma pink and viable and bilious liquid output draining to gravity bag.  JP with SS output Lab Results:  Recent Labs    09/04/22 0442 09/05/22 0251  WBC 6.1 6.2  HGB 8.7* 9.6*  HCT 25.4* 29.2*  PLT 175 242   BMET Recent Labs    09/04/22 0442 09/05/22 0251  NA 132* 131*  K 3.9 3.8  CL 101 98  CO2 23 23  GLUCOSE 94 90  BUN 11 9  CREATININE 0.79 0.79  CALCIUM 7.5* 7.4*   PT/INR No results for input(s): "LABPROT", "INR" in the last 72 hours. ABG No results for input(s): "PHART", "HCO3" in the last 72 hours.  Invalid input(s): "PCO2", "PO2"  Studies/Results: No results found.  Anti-infectives: Anti-infectives (From admission, onward)    Start     Dose/Rate Route Frequency Ordered Stop   09/01/22 2200  linezolid (ZYVOX) tablet 600 mg        600 mg Oral Every 12 hours 09/01/22 1255 09/04/22 0950   09/01/22 0600  cefoTEtan (CEFOTAN) 2 g in sodium chloride 0.9 % 100 mL IVPB        2 g 200 mL/hr over 30 Minutes Intravenous On call to O.R. 08/31/22 1204 09/01/22 1244   08/31/22 1400  neomycin (MYCIFRADIN) tablet 1,000 mg       See Hyperspace for full  Linked Orders Report.   1,000 mg Oral 3 times per day 08/31/22 1204 08/31/22 2110   08/31/22 1400  metroNIDAZOLE (FLAGYL) tablet 1,000 mg       See Hyperspace for full Linked Orders Report.   1,000 mg Oral 3 times per day 08/31/22 1204 08/31/22 2109   08/30/22 1415  nitrofurantoin (macrocrystal-monohydrate) (MACROBID) capsule 100 mg  Status:  Discontinued        100 mg Oral Every 12 hours 08/30/22 1329 09/01/22 1255   08/27/22 2200  cefTRIAXone (ROCEPHIN) 2 g in sodium chloride 0.9 % 100 mL IVPB        2 g 200 mL/hr over 30 Minutes Intravenous Every 24 hours 08/27/22 1137     08/27/22 1030  cefTRIAXone (ROCEPHIN) 1 g in sodium chloride 0.9 % 100 mL IVPB  Status:  Discontinued        1 g 200 mL/hr over 30 Minutes Intravenous Every 24 hours 08/27/22 1021 08/27/22 1137   08/27/22 1030  metroNIDAZOLE (FLAGYL) IVPB 500 mg        500 mg 100 mL/hr over 60 Minutes Intravenous Every 12 hours 08/27/22 1021         Assessment/Plan: s/p Procedure(s): LAPAROSCOPIC SIGMOIDECTOMY CONVERTED TO OPEN, TAKEDOWN OF COLOVESICAL FISTULA AND LOOP COLOSTOMY (N/A) CYSTOSCOPY WITH BILATERAL URETERAL STENT PLACEMENTS REPAIR BLADDER FISTULA WITH  HYDRODISTENTION WITH METHYLINE BLUE  77 yo female with colovesical fistula with recurrent UTIs POD 4  s/p  laparoscopic converted to open sigmoid resection with anastomosis, diverting loop ileostomy, take down of colovesical fistula 4/24 Dr. Sheliah Hatch Intraoperative bladder repair - Dr. Laverle Patter   - 24 Continue gravity bag and trend output.  soft  this am - monitor output and electrolytes  -Continue antibiotics for now for intra-abdominal coverage -Encouraged ambulation and IS use   FEN - FLD adat soft VTE - lovenox ID - rocephin/flagyl. linezolid  Needs DC planning this week      LOS: 9 days    April Weber [name] ND  09/05/2022

## 2022-09-06 ENCOUNTER — Inpatient Hospital Stay (HOSPITAL_COMMUNITY): Payer: Medicare Other

## 2022-09-06 DIAGNOSIS — N321 Vesicointestinal fistula: Secondary | ICD-10-CM | POA: Diagnosis not present

## 2022-09-06 LAB — BASIC METABOLIC PANEL
Anion gap: 7 (ref 5–15)
BUN: 11 mg/dL (ref 8–23)
CO2: 25 mmol/L (ref 22–32)
Calcium: 7.7 mg/dL — ABNORMAL LOW (ref 8.9–10.3)
Chloride: 100 mmol/L (ref 98–111)
Creatinine, Ser: 0.77 mg/dL (ref 0.44–1.00)
GFR, Estimated: >60 mL/min (ref >60)
Glucose, Bld: 91 mg/dL (ref 70–99)
Potassium: 3.8 mmol/L (ref 3.5–5.1)
Sodium: 132 mmol/L — ABNORMAL LOW (ref 135–145)

## 2022-09-06 LAB — POCT I-STAT, CHEM 8
BUN: <3 mg/dL — ABNORMAL LOW (ref 8–23)
Calcium, Ion: 1.12 mmol/L — ABNORMAL LOW (ref 1.15–1.40)
Chloride: 103 mmol/L (ref 98–111)
Creatinine, Ser: 0.7 mg/dL (ref 0.44–1.00)
Glucose, Bld: 109 mg/dL — ABNORMAL HIGH (ref 70–99)
HCT: 26 % — ABNORMAL LOW (ref 36.0–46.0)
Hemoglobin: 8.8 g/dL — ABNORMAL LOW (ref 12.0–15.0)
Potassium: 3.9 mmol/L (ref 3.5–5.1)
Sodium: 134 mmol/L — ABNORMAL LOW (ref 135–145)
TCO2: 24 mmol/L (ref 22–32)

## 2022-09-06 LAB — CBC
HCT: 28.5 % — ABNORMAL LOW (ref 36.0–46.0)
Hemoglobin: 9.3 g/dL — ABNORMAL LOW (ref 12.0–15.0)
MCH: 30.9 pg (ref 26.0–34.0)
MCHC: 32.6 g/dL (ref 30.0–36.0)
MCV: 94.7 fL (ref 80.0–100.0)
Platelets: 259 10*3/uL (ref 150–400)
RBC: 3.01 MIL/uL — ABNORMAL LOW (ref 3.87–5.11)
RDW: 18 % — ABNORMAL HIGH (ref 11.5–15.5)
WBC: 6.4 10*3/uL (ref 4.0–10.5)
nRBC: 0 % (ref 0.0–0.2)

## 2022-09-06 MED ORDER — IOTHALAMATE MEGLUMINE 17.2 % UR SOLN: 250.0000 mL | Freq: Once | URETHRAL | Status: DC | PRN

## 2022-09-06 NOTE — TOC Progression Note (Addendum)
Transition of Care [facility]) - Progression Note    Patient Details  Name: April Weber MRN: [id number] Date of Birth: 12-May-1945  Transition of [facility] CM/SW Contact  [employer], Olegario Messier, RN Phone Number: 09/06/2022, 3:07 PM  Clinical Narrative: No preference for [facility] HHRN/PT/OT rep [facility] aware. Has own transport home.      Expected Discharge Plan: Home w Home Health Services Barriers to Discharge: Continued Medical Work up  Expected Discharge Plan and Services       Living arrangements for the past 2 months: Single Family Home                           HH Arranged: RN, PT [facility] Agency: [facility] Date [facility] Agency Contacted: 09/06/22 Time HH Agency Contacted: 1507 Representative spoke with at [facility] Agency: Kandee Keen   Social Determinants of Health (SDOH) Interventions SDOH Screenings   Food Insecurity: No Food Insecurity (08/27/2022)  Housing: Low Risk  (08/27/2022)  Transportation Needs: No Transportation Needs (08/27/2022)  Utilities: Not At Risk (08/27/2022)  Alcohol Screen: Low Risk  (08/26/2021)  Depression (PHQ2-9): Low Risk  (06/01/2022)  Financial Resource Strain: Low Risk  (04/08/2022)  Physical Activity: Sufficiently Active (08/26/2021)  Social Connections: Moderately Integrated (08/26/2021)  Stress: No Stress Concern Present (08/26/2021)  Tobacco Use: Medium Risk (09/02/2022)    Readmission Risk Interventions    08/30/2022    2:22 PM 06/14/2022    2:06 PM  Readmission Risk Prevention Plan  Transportation Screening Complete Complete  PCP or Specialist Appt within 3-5 Days Complete Complete  HRI or Home Care Consult Complete Complete  Social Work Consult for Recovery Care Planning/Counseling Complete Complete  Palliative Care Screening Not Applicable Not Applicable  Medication Review Oceanographer) Complete Referral to Pharmacy

## 2022-09-06 NOTE — TOC Progression Note (Signed)
Transition of Care [facility]) - Progression Note    Patient Details  Name: April Weber MRN: [id number] Date of Birth: [birth year]  Transition of Care [facility]) CM/SW Contact  Trip Cavanagh, Olegario Messier, RN Phone Number: 09/06/2022, 12:56 PM  Clinical Narrative:  Patient asked if TOC can come back later to discuss HHC-may need HHRN/PT if appropriate. WOC to see.     Expected Discharge Plan: Home w Home Health Services Barriers to Discharge: Continued Medical Work up  Expected Discharge Plan and Services                                               Social Determinants of Health (SDOH) Interventions SDOH Screenings   Food Insecurity: No Food Insecurity (08/27/2022)  Housing: Low Risk  (08/27/2022)  Transportation Needs: No Transportation Needs (08/27/2022)  Utilities: Not At Risk (08/27/2022)  Alcohol Screen: Low Risk  (08/26/2021)  Depression (PHQ2-9): Low Risk  (06/01/2022)  Financial Resource Strain: Low Risk  (04/08/2022)  Physical Activity: Sufficiently Active (08/26/2021)  Social Connections: Moderately Integrated (08/26/2021)  Stress: No Stress Concern Present (08/26/2021)  Tobacco Use: Medium Risk (09/02/2022)    Readmission Risk Interventions    08/30/2022    2:22 PM 06/14/2022    2:06 PM  Readmission Risk Prevention Plan  Transportation Screening Complete Complete  PCP or Specialist Appt within 3-5 Days Complete Complete  HRI or Home Care Consult Complete Complete  Social Work Consult for Recovery Care Planning/Counseling Complete Complete  Palliative Care Screening Not Applicable Not Applicable  Medication Review Oceanographer) Complete Referral to Pharmacy

## 2022-09-06 NOTE — Progress Notes (Signed)
Physical Therapy Treatment Patient Details Name: April Weber MRN: [id number] DOB: Feb 13, 1946 Today's Date: 09/06/2022   History of Present Illness April Weber is a 77 y.o. female s/p laparoscopic converted to open sigmoid resection with anastomosis, diverting loop ileostomy, take down of colovesical fistula 4/24. PMH: osteoarthritis, right breast cancer, stage IIIa CKD,single kidney, history of DVT, dyspnea, hyperlipidemia, hypertension, osteopenia, peripheral vascular disease, seasonal allergies, hypothyroidism    PT Comments    POD #5 Assisted pt to transfer from supine to sit EOB, pt very independent and quick to initiate. Assisted pt to ambulate in hallway for 500 ft (100 ft with RW, then 50 without to assess need for assistive device, then 450 with RW). Pt tends to scissoring gait slightly and lateral weight shift primarily to the L. No noted LOB. Pt said that she "mostly is concerned about reassurance" with use of an assistive device. Assisted pt to ambulate to bathroom where pt voided. Assisted pt to transfer to recliner. Pt is making progress with mobility by increasing ambulation distance and lack of noted fatigue. Pt plans to D/C to home with [facility].   Recommendations for follow up therapy are one component of a multi-disciplinary discharge planning process, led by the attending physician.  Recommendations may be updated based on patient status, additional functional criteria and insurance authorization.  Follow Up Recommendations       Assistance Recommended at Discharge Frequent or constant Supervision/Assistance  Patient can return home with the following A little help with walking and/or transfers;A little help with bathing/dressing/bathroom;Assistance with cooking/housework;Help with stairs or ramp for entrance;Assist for transportation   Equipment Recommendations  Cane (quad cane)    Recommendations for Other Services       Precautions / Restrictions  Precautions Precautions: Fall Precaution Comments: JP drain, ileostomy, foley, monitor HR Restrictions Weight Bearing Restrictions: No     Mobility  Bed Mobility Overal bed mobility: Modified Independent Bed Mobility: Supine to Sit     Supine to sit: Min guard     General bed mobility comments: Implusive, very independant    Transfers Overall transfer level: Needs assistance Equipment used: Rolling walker (2 wheels), None Transfers: Sit to/from Stand, Bed to chair/wheelchair/BSC Sit to Stand: Min guard   Step pivot transfers: Min guard            Ambulation/Gait Ambulation/Gait assistance: Min guard Gait Distance (Feet): 500 Feet Assistive device: Rolling walker (2 wheels), None Gait Pattern/deviations: Step-to pattern, Decreased stride length, Scissoring Gait velocity: decreased     General Gait Details: Walked 100 ft with RW, 50 ft trial with no assistive device, then finished 450 ft with walker, slight scissoring, frustrated with RW   Stairs             Wheelchair Mobility    Modified Rankin (Stroke Patients Only)       Balance                                            Cognition Arousal/Alertness: Awake/alert Behavior During Therapy: WFL for tasks assessed/performed Overall Cognitive Status: Within Functional Limits for tasks assessed                                          Exercises      General  Comments        Pertinent Vitals/Pain Pain Assessment Pain Assessment: No/denies pain Pain Intervention(s): Monitored during session, Repositioned    Home Living                          Prior Function            PT Goals (current goals can now be found in the care plan section) Acute Rehab PT Goals Patient Stated Goal: less pain PT Goal Formulation: With patient Time For Goal Achievement: 09/16/22 Potential to Achieve Goals: Good Progress towards PT goals: Progressing toward  goals    Frequency    Min 1X/week      PT Plan Current plan remains appropriate    Co-evaluation              AM-PAC PT "6 Clicks" Mobility   Outcome Measure  Help needed turning from your back to your side while in a flat bed without using bedrails?: A Little Help needed moving from lying on your back to sitting on the side of a flat bed without using bedrails?: A Little Help needed moving to and from a bed to a chair (including a wheelchair)?: A Little Help needed standing up from a chair using your arms (e.g., wheelchair or bedside chair)?: A Little Help needed to walk in hospital room?: A Little Help needed climbing 3-5 steps with a railing? : A Lot 6 Click Score: 17    End of Session Equipment Utilized During Treatment: Gait belt Activity Tolerance: Patient tolerated treatment well Patient left: in chair;with call bell/phone within reach   PT Visit Diagnosis: Other abnormalities of gait and mobility (R26.89);Muscle weakness (generalized) (M62.81);Pain     Time: 1420-1440 PT Time Calculation (min) (ACUTE ONLY): 20 min  Charges:  $Gait Training: 8-22 mins                     Sharlene Motts, [address]

## 2022-09-06 NOTE — Progress Notes (Signed)
Assessment & Plan: POD#5 - s/p Procedure(s): LAPAROSCOPIC SIGMOIDECTOMY CONVERTED TO OPEN, TAKEDOWN OF COLOVESICAL FISTULA AND LOOP COLOSTOMY (N/A) CYSTOSCOPY WITH BILATERAL URETERAL STENT PLACEMENTS REPAIR BLADDER FISTULA WITH HYDRODISTENTION WITH METHYLINE BLUE   77 yo female with colovesical fistula with recurrent UTIs POD#5 - s/p  laparoscopic converted to open sigmoid resection with anastomosis, diverting loop ileostomy, take down of colovesical fistula 4/24 Dr. Sheliah Hatch Intraoperative bladder repair - Dr. Laverle Patter   - continue gravity bag and trend output - continue antibiotics for now for intra-abdominal coverage - encouraged ambulation and IS use - cystogram this AM just performed   FEN - soft diet VTE - lovenox ID - rocephin/flagyl, linezolid   Needs DC planning this week - home with HHN/PT/OT vs rehab - per primary service        Darnell Level, MD [facility] practice Office: [phone]        Chief Complaint: Colovesical fistula  Subjective: Patient back from x-ray, in bed, having breakfast  Objective: Vital signs in last 24 hours: Temp:  [97.7 F (36.5 C)-98.2 F (36.8 C)] 97.7 F (36.5 C) (04/29 0553) Pulse Rate:  [78-85] 83 (04/29 0553) Resp:  [15-18] 16 (04/29 0553) BP: (133-147)/(63-84) 133/63 (04/29 0553) SpO2:  [98 %-100 %] 98 % (04/29 0553) Last BM Date : 09/05/22  Intake/Output from previous day: 04/28 0701 - 04/29 0700 In: 300.3 [IV Piggyback:300.3] Out: 4200 [Urine:2950; Drains:350; Stool:900] Intake/Output this shift: No intake/output data recorded.  Physical Exam: HEENT - sclerae clear, mucous membranes moist Foley in with good urine output Succus in gravity bag  Lab Results:  Recent Labs    09/05/22 0251 09/06/22 0200  WBC 6.2 6.4  HGB 9.6* 9.3*  HCT 29.2* 28.5*  PLT 242 259   BMET Recent Labs    09/05/22 0251 09/06/22 0200  NA 131* 132*  K 3.8 3.8  CL 98 100  CO2 23 25  GLUCOSE 90 91   BUN 9 11  CREATININE 0.79 0.77  CALCIUM 7.4* 7.7*   PT/INR No results for input(s): "LABPROT", "INR" in the last 72 hours. Comprehensive Metabolic Panel:    Component Value Date/Time   NA 132 (L) 09/06/2022 0200   NA 131 (L) 09/05/2022 0251   NA 134 06/01/2022 0848   NA 135 11/05/2021 0810   K 3.8 09/06/2022 0200   K 3.8 09/05/2022 0251   CL 100 09/06/2022 0200   CL 98 09/05/2022 0251   CO2 25 09/06/2022 0200   CO2 23 09/05/2022 0251   BUN 11 09/06/2022 0200   BUN 9 09/05/2022 0251   BUN 7 (L) 06/01/2022 0848   BUN 11 11/05/2021 0810   CREATININE 0.77 09/06/2022 0200   CREATININE 0.79 09/05/2022 0251   CREATININE 0.93 08/10/2022 0857   CREATININE 1.03 (H) 07/19/2022 0750   CREATININE 0.99 02/05/2013 0757   CREATININE 0.96 07/27/2012 0843   GLUCOSE 91 09/06/2022 0200   GLUCOSE 90 09/05/2022 0251   CALCIUM 7.7 (L) 09/06/2022 0200   CALCIUM 7.4 (L) 09/05/2022 0251   AST 13 (L) 08/28/2022 0100   AST 13 (L) 08/23/2022 2013   AST 12 (L) 08/10/2022 0857   AST 17 07/19/2022 0750   ALT 12 08/28/2022 0100   ALT 10 08/23/2022 2013   ALT 11 08/10/2022 0857   ALT 12 07/19/2022 0750   ALKPHOS 35 (L) 08/28/2022 0100   ALKPHOS 37 (L) 08/23/2022 2013   BILITOT 0.5 08/28/2022 0100   BILITOT 0.4 08/23/2022 2013  BILITOT 0.3 08/10/2022 0857   BILITOT 0.4 07/19/2022 0750   PROT 5.4 (L) 08/28/2022 0100   PROT 5.7 (L) 08/23/2022 2013   PROT 6.0 06/01/2022 0848   PROT 6.8 05/13/2021 0813   ALBUMIN 2.5 (L) 08/28/2022 0100   ALBUMIN 3.5 08/23/2022 2013   ALBUMIN 3.6 (L) 06/01/2022 0848   ALBUMIN 4.3 11/05/2021 0810    Studies/Results: No results found.    Darnell Level 09/06/2022  Patient ID: April Weber, female   DOB: 1945-08-25, 77 y.o.   MRN: [id number]

## 2022-09-06 NOTE — Consult Note (Signed)
WOC Nurse ostomy follow up. WOC with patient greater than 1.5 hours Stoma type/location: RLQ, loop ileostomy  Stomal assessment/size: oblong; aprox 2" x 1 1/8", pink, moist, budded, functional os at 12 oclock  Peristomal assessment: mild denudation at 9 o'clock  Treatment options for stomal/peristomal skin: skin barrier ring  Output liquid green/brown, high output  Ostomy pouching: 2pc. 2 3/4" HOP pouch with spout, connected to bedside drainage bag Education provided:  2nd educational session  Explained stoma characteristics (budded, flush, color, texture, care) Demonstrated pouch change (cutting new skin barrier- allowed patient to cut new skin barrier, answered numerous questions about cutting skin barrier,  measuring stoma- using pattern created by ostomy nurse; demonstrated cleaning peristomal skin and stoma, use of barrier ring) Allowed patient to attach pouch to the skin barrier several  times  Education on emptying when 1/3 to 1/2 full and how to empty Demonstrated "burping" flatus from pouch Discussed gas, risk of dehydration, food blockage. Answered numerous questions about diet  Provided patient with [facility] and marked items currently using Allow patient to verbalize steps of pouch change 3-4 times. She is struggling with this. Provided pictures from educational materials and used them to reinforce teaching.  She struggles with the correct order of the steps.   Allowed her to close and open spout of HOP pouch.  Reminded her about lock and roll closure, if the output becomes too thick for current pouch she will need to convert to drainable pouch with lock and roll closure  10 skin barriers/4 lock and roll closure pouches/8 high output pouches/10 barrier rings. Graduate to empty stool into, patient request Bedside drainage bag to use at night if needed    Enrolled patient in [employer] Discharge program: Yes, patient is not aware if package has arrived. Will ask  neighbor.   Patient's daughter is a PA in [place], she will be home with patient for the first night. She was present for one teaching session. She will only be home with her one night.  Patient will benefit greatly from [facility].    Planning for DC Wednesday per patient.   Educational materials in the room, marked QR codes for daughter, ostomy clinic information.   Patient has requested that WOC come by tomorrow as well.   April Weber [facility], CNS, [employer] [phone]

## 2022-09-06 NOTE — Progress Notes (Signed)
[facility] PROGRESS NOTE  April Weber (DOB: 1945/09/15) ZOX:[id number] PCP: Raliegh Ip, DO  Brief Narrative: April Weber is a 77 y.o. female with medical history significant of osteoarthritis, right breast cancer, stage IIIa CKD, single kidney, history of DVT, dyspnea, hyperlipidemia, hypertension, osteopenia, peripheral vascular disease, seasonal allergies, hypothyroidism who presented to the emergency department complaints of decreased appetite, nausea, lower abdominal pain, constipation and low-grade fever for the past few days.     Patient has known colovesicular and colovaginal fistula discovered earlier this month after recurrent infections.  CT abdomen pelvis without contrast done just 4 days prior to admission shows increased large volume of gas within the bladder.  Of note she just recently completed chemotherapy the first week of April.  As such urology surgery and oncology have been called in consult.  Hospitalist has was called for admission.  She underwent sigmoid colectomy, loop colostomy, and bladder repair 4/24.   Subjective: Some soreness, but getting around better, she's apprehensive about going home, doesn't feel like she understands the ostomy very much. Hasn't voided since foley removed.   Objective: BP 133/63 (BP Location: Left Arm)   Pulse 83   Temp 97.7 F (36.5 C) (Oral)   Resp 16   Ht 5\' 6"  (1.676 m)   Wt 71.7 kg   SpO2 98%   BMI 25.50 kg/m   Gen: No distress Pulm: Clear, nonlabored  CV: RRR, no MRG GI: Soft, appropriately tender without distention, +BS Neuro: Alert and oriented. No new focal deficits. Ext: Warm, no deformities. Skin: Abdominal incisions c/d/I, ostomy pink without erythema or discharge or bloody stool. JP drain with serosanguinous discharge. No new rashes, lesions or ulcers on visualized skin   Assessment & Plan: Sepsis secondary to colovesical/colovaginal fistulas, POA:  - now s/p sigmoid resection with  anastomosis, diverting loop ileostomy, takedown of colovesical fistula with intraoperative bladder repair by Drs. [name] and Borden 09/01/2022.  - Surgery following. ADAT per surgery > soft on 4/27. Push po intake as tolerated. Follow up is scheduled 5/6. - Urology following, creatinine from JP is 1 consistent with serum and cystogram 4/29 showed no leak. Voiding trial today.  - Has received ceftriaxone/flagyl x7 days (4/19 - 4/25), linezolid 4/24 - 4/26. Pt is immunocompromised, though WBC remains normal, afebrile. Will conclude antibiotic therapy at 10 days. - Continue ostomy teaching, needs this here and with home health at discharge.  - Continue incentive spirometry, OOB. Pt motivated to go home at DC instead of SNF. Working with PT. Home health ordered, alerted TOC.   ABLA on AOCD:  - s/p 1u PRBCs 4/25, 1u PRBC 4/26. Hgb up to 9.3g/dl with improved exertional symptoms.    Stage IIIa CKD:  - Continue monitoring, Cr improved 4/27.  - PO intake improving, stopped IVF and metabolic parameters stable.   Right ovarian epithelial cancer: Completed chemotherapy first week of April - Follow-up with Dr. Bertis Ruddy as scheduled 5/23  Hypothyroidism: TSH 4.240 - Continue synthroid  HTN:  - Restarted home atenolol  - Norvasc on med list, reordered, BP still elevated, increase dose.  HLD:  - Restart statin once tolerating po well  Tyrone Nine, MD [facility] [email or web address] 09/06/2022, 12:44 PM

## 2022-09-06 NOTE — Progress Notes (Signed)
Urology Progress Note    Subjective: Patient seems to have a little more energy this morning.  She was resting in bed on my arrival and demonstrated pulling herself up without difficulty.  She has been ambulating around the unit and tolerating her diet.  No acute events overnight.  Objective: Vital signs in last 24 hours: Temp:  [97.7 F (36.5 C)-98.2 F (36.8 C)] 97.7 F (36.5 C) (04/29 0553) Pulse Rate:  [78-85] 83 (04/29 0553) Resp:  [15-18] 16 (04/29 0553) BP: (133-147)/(63-84) 133/63 (04/29 0553) SpO2:  [98 %-100 %] 98 % (04/29 0553)  Intake/Output from previous day: 04/28 0701 - 04/29 0700 In: 300.3 [IV Piggyback:300.3] Out: 4200 [Urine:2950; Drains:350; Stool:900] Intake/Output this shift: No intake/output data recorded.  Physical Exam:  General: Alert and oriented CV: Regular rate Lungs: Normal work of breathing on room air Abdomen: Soft, nontender, nondistended. Colostomy in place with thin brown stool.  GU: Foley catheter in place to gravity drainage. Urine draining is clear yellow.  Ext: NT, No erythema  Lab Results: Recent Labs    09/04/22 0442 09/05/22 0251 09/06/22 0200  HGB 8.7* 9.6* 9.3*  HCT 25.4* 29.2* 28.5*   BMET Recent Labs    09/05/22 0251 09/06/22 0200  NA 131* 132*  K 3.8 3.8  CL 98 100  CO2 23 25  GLUCOSE 90 91  BUN 9 11  CREATININE 0.79 0.77  CALCIUM 7.4* 7.7*     Studies/Results: DG Cystogram  Result Date: 09/06/2022 CLINICAL DATA:  Status post repair of colovesical fistula. EXAM: CYSTOGRAM TECHNIQUE: Bladder was filled with 250 mL Cysto-Hypaque 30% by drip infusion through the existing Foley catheter. Serial spot images were obtained during bladder filling and post draining. FLUOROSCOPY: Radiation Exposure Index (as provided by the fluoroscopic device): 26.1 mGy Kerma COMPARISON:  09/01/2022 and CT 08/27/2018 for FINDINGS: Scout image demonstrates bilateral ureter stents and a surgical drain in the pelvis. Images were obtained as  the bladder was filled with contrast. There is no evidence for a bladder leak. Contrast refluxed into both ureter stents and filled the renal pelvises bilaterally. No evidence for a colonic fistula. There is no contrast filling the surgical drain. Postvoid image demonstrates an empty urinary bladder with residual contrast in the renal pelvises. Again noted are dilated renal pelvises, left side greater than right. IMPRESSION: 1. No evidence for a bladder leak or colonic fistula. 2. Patent bilateral ureter stents with bilateral ureterovesical reflux. Electronically Signed   By: Richarda Overlie M.D.   On: 09/06/2022 09:45    Assessment/Plan:  77 y.o. female admitted with pneumaturia and dysuria with colovesical fistula on recent CT imaging.  - Urology assisted general surgery in the OR for placement of ureteral marking stents and assist with bladder closure.   - Drain creatinine consistent with serum. No evidence of urinary leak. Cystogram on 4/29 shows no extravasation or evidence of fistula. TOV today.  -Labs and VSS stable. Continue to trend  -Will continue to follow    LOS: 10 days   Scherrie Bateman Dejon Lukas 09/06/2022, 10:08 AM

## 2022-09-07 ENCOUNTER — Telehealth: Payer: Self-pay | Admitting: Hematology and Oncology

## 2022-09-07 DIAGNOSIS — N321 Vesicointestinal fistula: Secondary | ICD-10-CM | POA: Diagnosis not present

## 2022-09-07 MED ORDER — LOPERAMIDE HCL 2 MG PO CAPS
2.0000 mg | ORAL_CAPSULE | Freq: Every day | ORAL | Status: DC
  Administered 2022-09-07: 2 mg via ORAL
  Filled 2022-09-07: qty 1

## 2022-09-07 MED ORDER — SIMVASTATIN 20 MG PO TABS
20.0000 mg | ORAL_TABLET | Freq: Every day | ORAL | Status: DC
  Administered 2022-09-07 – 2022-09-08 (×2): 20 mg via ORAL
  Filled 2022-09-07 (×2): qty 1

## 2022-09-07 NOTE — Plan of Care (Signed)
  Problem: Education: Goal: Knowledge of General Education information will improve Description: Including pain rating scale, medication(s)/side effects and non-pharmacologic comfort measures Outcome: Progressing   Problem: Health Behavior/Discharge Planning: Goal: Ability to manage health-related needs will improve Outcome: Progressing   Problem: Clinical Measurements: Goal: Ability to maintain clinical measurements within normal limits will improve Outcome: Progressing Goal: Will remain free from infection Outcome: Progressing Goal: Diagnostic test results will improve Outcome: Progressing Goal: Respiratory complications will improve Outcome: Progressing Goal: Cardiovascular complication will be avoided Outcome: Progressing   Problem: Activity: Goal: Risk for activity intolerance will decrease Outcome: Progressing   Problem: Nutrition: Goal: Adequate nutrition will be maintained Outcome: Progressing   Problem: Coping: Goal: Level of anxiety will decrease Outcome: Progressing   Problem: Elimination: Goal: Will not experience complications related to bowel motility Outcome: Progressing Goal: Will not experience complications related to urinary retention Outcome: Progressing   Problem: Pain Managment: Goal: General experience of comfort will improve Outcome: Progressing   Problem: Safety: Goal: Ability to remain free from injury will improve Outcome: Progressing   Problem: Skin Integrity: Goal: Risk for impaired skin integrity will decrease Outcome: Progressing   Problem: Education: Goal: Understanding of discharge needs will improve Outcome: Progressing Goal: Verbalization of understanding of the causes of altered bowel function will improve Outcome: Progressing   Problem: Activity: Goal: Ability to tolerate increased activity will improve Outcome: Progressing   Problem: Bowel/Gastric: Goal: Gastrointestinal status for postoperative course will  improve Outcome: Progressing   Problem: Health Behavior/Discharge Planning: Goal: Identification of community resources to assist with postoperative recovery needs will improve Outcome: Progressing   Problem: Nutritional: Goal: Will attain and maintain optimal nutritional status will improve Outcome: Progressing   Problem: Clinical Measurements: Goal: Postoperative complications will be avoided or minimized Outcome: Progressing   Problem: Respiratory: Goal: Respiratory status will improve Outcome: Progressing   Problem: Skin Integrity: Goal: Will show signs of wound healing Outcome: Progressing   

## 2022-09-07 NOTE — Consult Note (Addendum)
WOC Nurse ostomy follow up greater than 1.5 hours spent with patient today  Patient request additional teaching visit today. Will allow hands on practice again today with model.    Patient practiced cutting skin barrier, placement of barrier ring around stoma, attaching pouch to skin barrier, closure of lock and roll closure, cleaning spout with wick (in case she transitions to lock and roll closure), removal of side tabs.   She performed both hands on and by recall (6) times.  I have provided her with 8 2 3/4" skin barrier/high output pouches and barrier rings.  Additionally 8 2 3/4" lock and roll closures with skin barriers and barrier rings.  Pattern, scissors, sharpie marker, hand mirror given to patient.  Provided ileostomy dietary guidelines handout.    Patient will make herself a "cheat sheet" and I will review in the am with her prior to DC. Patient to have [facility] for support   Enrolled in [place] per H. Bullins WOC  Claxton Levitz [facility], CNS, [employer] [phone]

## 2022-09-07 NOTE — Progress Notes (Signed)
Patient ID: April Weber, female   DOB: [birth year], 77 y.o.   MRN: [id number]  6 Days Post-Op Subjective: Cystogram indicated no leak yesterday and catheter was removed.  Voiding well.  Objective: Vital signs in last 24 hours: Temp:  [98.5 F (36.9 C)-98.7 F (37.1 C)] 98.5 F (36.9 C) (04/30 0519) Pulse Rate:  [78-83] 78 (04/30 0519) Resp:  [17] 17 (04/30 0519) BP: (134-160)/(74-78) 160/74 (04/30 0519) SpO2:  [98 %-99 %] 99 % (04/30 0519)  Intake/Output from previous day: 04/29 0701 - 04/30 0700 In: -  Out: 1440 [Drains:140; Stool:1300] Intake/Output this shift: No intake/output data recorded.  Physical Exam:  General: Alert and oriented   Lab Results: Recent Labs    09/05/22 0251 09/06/22 0200  HGB 9.6* 9.3*  HCT 29.2* 28.5*   BMET Recent Labs    09/05/22 0251 09/06/22 0200  NA 131* 132*  K 3.8 3.8  CL 98 100  CO2 23 25  GLUCOSE 90 91  BUN 9 11  CREATININE 0.79 0.77  CALCIUM 7.4* 7.7*     Studies/Results: DG Cystogram  Result Date: 09/06/2022 CLINICAL DATA:  Status post repair of colovesical fistula. EXAM: CYSTOGRAM TECHNIQUE: Bladder was filled with 250 mL Cysto-Hypaque 30% by drip infusion through the existing Foley catheter. Serial spot images were obtained during bladder filling and post draining. FLUOROSCOPY: Radiation Exposure Index (as provided by the fluoroscopic device): 26.1 mGy Kerma COMPARISON:  09/01/2022 and CT 08/27/2018 for FINDINGS: Scout image demonstrates bilateral ureter stents and a surgical drain in the pelvis. Images were obtained as the bladder was filled with contrast. There is no evidence for a bladder leak. Contrast refluxed into both ureter stents and filled the renal pelvises bilaterally. No evidence for a colonic fistula. There is no contrast filling the surgical drain. Postvoid image demonstrates an empty urinary bladder with residual contrast in the renal pelvises. Again noted are dilated renal pelvises, left side greater than  right. IMPRESSION: 1. No evidence for a bladder leak or colonic fistula. 2. Patent bilateral ureter stents with bilateral ureterovesical reflux. Electronically Signed   By: Richarda Overlie M.D.   On: 09/06/2022 09:45    Assessment/Plan: POD # 6 s/p colovesical fistula repair and bilateral ureteral stent placement - Ok for discharge from a urologic standpoint.  Ok to remove pelvic drain from urologic standpoint. - Will arrange outpatient f/u in 6-8 weeks for stent removal. - Will sign off.  Please call if further questions.   LOS: 11 days   Crecencio Mc 09/07/2022, 8:15 AM

## 2022-09-07 NOTE — Progress Notes (Signed)
Progress Note  6 Days Post-Op  Subjective: Tolerating diet and now new complaints. Pain controlled. Nervous about going home soon   Objective: Vital signs in last 24 hours: Temp:  [98.5 F (36.9 C)-98.7 F (37.1 C)] 98.5 F (36.9 C) (04/30 0519) Pulse Rate:  [78-83] 78 (04/30 0519) Resp:  [17] 17 (04/30 0519) BP: (134-160)/(74-78) 160/74 (04/30 0519) SpO2:  [98 %-99 %] 99 % (04/30 0519) Last BM Date : 09/05/22  Intake/Output from previous day: 04/29 0701 - 04/30 0700 In: -  Out: 1440 [Drains:140; Stool:1300] Intake/Output this shift: No intake/output data recorded.  PE: Gen: NAD Resp: nonlabored on room air Abd: soft, mild TTP in right abdomen without rebound or guarding. incision with honeycomb CDI.  Ileostomy stoma pink and viable and bilious liquid output draining to gravity bag -1300 ml/24h.  JP with serous output   Lab Results:  Recent Labs    09/05/22 0251 09/06/22 0200  WBC 6.2 6.4  HGB 9.6* 9.3*  HCT 29.2* 28.5*  PLT 242 259   BMET Recent Labs    09/05/22 0251 09/06/22 0200  NA 131* 132*  K 3.8 3.8  CL 98 100  CO2 23 25  GLUCOSE 90 91  BUN 9 11  CREATININE 0.79 0.77  CALCIUM 7.4* 7.7*   PT/INR No results for input(s): "LABPROT", "INR" in the last 72 hours. CMP     Component Value Date/Time   NA 132 (L) 09/06/2022 0200   NA 134 06/01/2022 0848   K 3.8 09/06/2022 0200   CL 100 09/06/2022 0200   CO2 25 09/06/2022 0200   GLUCOSE 91 09/06/2022 0200   BUN 11 09/06/2022 0200   BUN 7 (L) 06/01/2022 0848   CREATININE 0.77 09/06/2022 0200   CREATININE 0.93 08/10/2022 0857   CREATININE 0.99 02/05/2013 0757   CALCIUM 7.7 (L) 09/06/2022 0200   PROT 5.4 (L) 08/28/2022 0100   PROT 6.0 06/01/2022 0848   ALBUMIN 2.5 (L) 08/28/2022 0100   ALBUMIN 3.6 (L) 06/01/2022 0848   AST 13 (L) 08/28/2022 0100   AST 12 (L) 08/10/2022 0857   ALT 12 08/28/2022 0100   ALT 11 08/10/2022 0857   ALKPHOS 35 (L) 08/28/2022 0100   BILITOT 0.5 08/28/2022 0100    BILITOT 0.3 08/10/2022 0857   GFRNONAA >60 09/06/2022 0200   GFRNONAA >60 08/10/2022 0857   GFRNONAA 59 (L) 02/05/2013 0757   GFRAA 61 05/20/2020 0850   GFRAA 68 02/05/2013 0757   Lipase     Component Value Date/Time   LIPASE 13 08/23/2022 2013       Studies/Results: DG Cystogram  Result Date: 09/06/2022 CLINICAL DATA:  Status post repair of colovesical fistula. EXAM: CYSTOGRAM TECHNIQUE: Bladder was filled with 250 mL Cysto-Hypaque 30% by drip infusion through the existing Foley catheter. Serial spot images were obtained during bladder filling and post draining. FLUOROSCOPY: Radiation Exposure Index (as provided by the fluoroscopic device): 26.1 mGy Kerma COMPARISON:  09/01/2022 and CT 08/27/2018 for FINDINGS: Scout image demonstrates bilateral ureter stents and a surgical drain in the pelvis. Images were obtained as the bladder was filled with contrast. There is no evidence for a bladder leak. Contrast refluxed into both ureter stents and filled the renal pelvises bilaterally. No evidence for a colonic fistula. There is no contrast filling the surgical drain. Postvoid image demonstrates an empty urinary bladder with residual contrast in the renal pelvises. Again noted are dilated renal pelvises, left side greater than right. IMPRESSION: 1. No evidence for a bladder  leak or colonic fistula. 2. Patent bilateral ureter stents with bilateral ureterovesical reflux. Electronically Signed   By: Richarda Overlie M.D.   On: 09/06/2022 09:45    Anti-infectives: Anti-infectives (From admission, onward)    Start     Dose/Rate Route Frequency Ordered Stop   09/05/22 2200  cefTRIAXone (ROCEPHIN) 2 g in sodium chloride 0.9 % 100 mL IVPB        2 g 200 mL/hr over 30 Minutes Intravenous Every 24 hours 09/05/22 0914 09/05/22 2151   09/05/22 1000  metroNIDAZOLE (FLAGYL) IVPB 500 mg        500 mg 100 mL/hr over 60 Minutes Intravenous Every 12 hours 09/05/22 0914 09/06/22 1011   09/01/22 2200  linezolid  (ZYVOX) tablet 600 mg        600 mg Oral Every 12 hours 09/01/22 1255 09/04/22 0950   09/01/22 0600  cefoTEtan (CEFOTAN) 2 g in sodium chloride 0.9 % 100 mL IVPB        2 g 200 mL/hr over 30 Minutes Intravenous On call to O.R. 08/31/22 1204 09/01/22 1244   08/31/22 1400  neomycin (MYCIFRADIN) tablet 1,000 mg       See Hyperspace for full Linked Orders Report.   1,000 mg Oral 3 times per day 08/31/22 1204 08/31/22 2110   08/31/22 1400  metroNIDAZOLE (FLAGYL) tablet 1,000 mg       See Hyperspace for full Linked Orders Report.   1,000 mg Oral 3 times per day 08/31/22 1204 08/31/22 2109   08/30/22 1415  nitrofurantoin (macrocrystal-monohydrate) (MACROBID) capsule 100 mg  Status:  Discontinued        100 mg Oral Every 12 hours 08/30/22 1329 09/01/22 1255   08/27/22 2200  cefTRIAXone (ROCEPHIN) 2 g in sodium chloride 0.9 % 100 mL IVPB  Status:  Discontinued        2 g 200 mL/hr over 30 Minutes Intravenous Every 24 hours 08/27/22 1137 09/05/22 0914   08/27/22 1030  cefTRIAXone (ROCEPHIN) 1 g in sodium chloride 0.9 % 100 mL IVPB  Status:  Discontinued        1 g 200 mL/hr over 30 Minutes Intravenous Every 24 hours 08/27/22 1021 08/27/22 1137   08/27/22 1030  metroNIDAZOLE (FLAGYL) IVPB 500 mg  Status:  Discontinued        500 mg 100 mL/hr over 60 Minutes Intravenous Every 12 hours 08/27/22 1021 09/05/22 0914        Assessment/Plan  77 yo female with colovesical fistula with recurrent UTIs POD#6 - s/p  laparoscopic converted to open sigmoid resection with anastomosis, diverting loop ileostomy, take down of colovesical fistula 4/24 Dr. Sheliah Hatch Intraoperative bladder repair - Dr. Laverle Patter   - continue gravity bag and trend output - 1373ml/24h - can discontinue abx from surgical standpoint - encouraged ambulation and IS use - cystogram without evidence of leak and foley out - cont drain for now but may be able to remove prior to dc  Hopefully home soon   FEN - soft diet VTE -  lovenox ID - rocephin/flagyl, linezolid   Needs DC planning this week - home with HHN/PT/OT vs rehab - per primary service   LOS: 11 days   Eric Form, [facility] 09/07/2022, 8:51 AM Please see Amion for pager number during day hours 7:00am-4:30pm

## 2022-09-07 NOTE — Progress Notes (Signed)
[facility] PROGRESS NOTE  NAKISHA CHAI (DOB: 1946/01/27) ZOX:[id number] PCP: Raliegh Ip, DO  Brief Narrative: April Weber is a 77 y.o. female with medical history significant of osteoarthritis, right breast cancer, stage IIIa CKD, single kidney, history of DVT, dyspnea, hyperlipidemia, hypertension, osteopenia, peripheral vascular disease, seasonal allergies, hypothyroidism who presented to the emergency department complaints of decreased appetite, nausea, lower abdominal pain, constipation and low-grade fever for the past few days.     Patient has known colovesicular and colovaginal fistula discovered earlier this month after recurrent infections.  CT abdomen pelvis without contrast done just 4 days prior to admission shows increased large volume of gas within the bladder.  Of note she just recently completed chemotherapy the first week of April.  As such urology surgery and oncology have been called in consult.  Hospitalist has was called for admission.  She underwent sigmoid colectomy, loop colostomy, and bladder repair 4/24.   Subjective: Soreness improved, voiding, worked with WOC and will again today.   Objective: BP (!) 160/74 (BP Location: Left Arm)   Pulse 78   Temp 98.5 F (36.9 C) (Oral)   Resp 17   Ht 5\' 6"  (1.676 m)   Wt 71.7 kg   SpO2 99%   BMI 25.50 kg/m   Gen: No distress, pleasant but anxious Pulm: Clear, nonlabored  CV: RRR, no MRG or pitting edema GI: Soft, ND, +BS, appropriately mildly TTP. Neuro: Alert and oriented. No new focal deficits. Ext: Warm, no deformities. Skin: No rashes, lesions or ulcers on visualized skin   Assessment & Plan: Sepsis secondary to colovesical/colovaginal fistulas, POA:  - now s/p sigmoid resection with anastomosis, diverting loop ileostomy, takedown of colovesical fistula with intraoperative bladder repair by Drs. [name] and Borden 09/01/2022.  - Surgery following. Follow up is scheduled 5/6. - Urology  follow up in 6 weeks for ureteral stents. Cystogram 4/29 showed no leak. Voiding. - Has received ceftriaxone/flagyl x7 days (4/19 - 4/25), linezolid 4/24 - 4/26. Pt is immunocompromised, though WBC remains normal, afebrile. Will conclude antibiotic therapy. - Continue ostomy teaching, needs this here and with home health at discharge. Repeat today. Pt apprehensive.  - Continue incentive spirometry, OOB. Pt motivated to go home at DC instead of SNF. Working with PT. Home health ordered, arranged.   ABLA on AOCD:  - s/p 1u PRBCs 4/25, 1u PRBC 4/26. Hgb up to 9.3g/dl with improved exertional symptoms. No bleeding.   Stage IIIa CKD: CrCl settled out just below 42ml/min.  - PO intake improving, stopped IVF and metabolic parameters stable.   Right ovarian epithelial cancer: Completed chemotherapy first week of April - Follow-up with Dr. Bertis Ruddy as rescheduled to 5/23  Hypothyroidism: TSH 4.240 - Continue synthroid  HTN:  - Restarted home atenolol  - Norvasc on med list, reordered, BP still elevated, increase dose.  HLD:  - Restart statin once tolerating po well  Tyrone Nine, MD [facility] [email or web address] 09/07/2022, 10:22 AM

## 2022-09-07 NOTE — Progress Notes (Signed)
Mobility Specialist - Progress Note   09/07/22 1044  Mobility  Activity Ambulated with assistance in hallway  Level of Assistance Independent after set-up  Assistive Device Front wheel walker  Distance Ambulated (ft) 450 ft  Range of Motion/Exercises Active  Activity Response Tolerated well  Mobility Referral Yes  $Mobility charge 1 Mobility   Pt received in bed and agreed to mobility, had some pain in LLQ. Pt returned to bed with all needs met.  Marilynne Halsted Mobility Specialist

## 2022-09-07 NOTE — Telephone Encounter (Signed)
patient called to verify time and date of appointment.

## 2022-09-08 ENCOUNTER — Telehealth: Payer: Self-pay

## 2022-09-08 DIAGNOSIS — I10 Essential (primary) hypertension: Secondary | ICD-10-CM

## 2022-09-08 DIAGNOSIS — N179 Acute kidney failure, unspecified: Secondary | ICD-10-CM

## 2022-09-08 DIAGNOSIS — N321 Vesicointestinal fistula: Secondary | ICD-10-CM | POA: Diagnosis not present

## 2022-09-08 MED ORDER — ACETAMINOPHEN 500 MG PO TABS: 1000.0000 mg | ORAL_TABLET | Freq: Four times a day (QID) | ORAL | 0 refills | Status: AC | PRN

## 2022-09-08 MED ORDER — LOPERAMIDE HCL 2 MG PO CAPS: 2.0000 mg | ORAL_CAPSULE | Freq: Every day | ORAL | 0 refills | Status: AC

## 2022-09-08 MED ORDER — ONDANSETRON HCL 4 MG PO TABS: 4.0000 mg | ORAL_TABLET | Freq: Four times a day (QID) | ORAL | 0 refills | Status: DC | PRN

## 2022-09-08 MED ORDER — HEPARIN SOD (PORK) LOCK FLUSH 100 UNIT/ML IV SOLN
500.0000 [IU] | INTRAVENOUS | Status: AC | PRN
  Administered 2022-09-08: 500 [IU]

## 2022-09-08 MED ORDER — OXYCODONE HCL 5 MG PO TABS: 5.0000 mg | ORAL_TABLET | Freq: Four times a day (QID) | ORAL | 0 refills | Status: DC | PRN

## 2022-09-08 NOTE — Consult Note (Signed)
WOC made aware patient DC and did not need visit today. She changed her own pouch independently last evening or early this am.   Dejion Grillo [facility], CNS, [employer] [phone]

## 2022-09-08 NOTE — Discharge Summary (Signed)
Physician Discharge Summary   Patient: April Weber MRN: [id number] DOB: 08/01/1945  Admit date:     08/27/2022  Discharge date: 09/08/22  Discharge Physician: Tyrone Nine   PCP: Raliegh Ip, DO   Recommendations at discharge:  Follow up with PCP, suggest recheck CBC and CMP in 1-2 weeks. Follow up with surgery clinic as scheduled next week.  Follow up with urology for ureteral stent evaluation/removal.  Follow up with oncology, Dr. Bertis Ruddy, as scheduled 5/23.   Discharge Diagnoses: Principal Problem:   Colovesical fistula Active Problems:   Hyperlipidemia   HTN (hypertension)   Hypothyroidism   Stage 3a chronic kidney disease (HCC)   Hyponatremia   Right ovarian epithelial cancer April Weber)   Hydronephrosis  Weber Course: April Weber is a 77 y.o. female with medical history significant of osteoarthritis, right breast cancer, stage IIIa CKD, single kidney, history of DVT, dyspnea, hyperlipidemia, hypertension, osteopenia, peripheral vascular disease, seasonal allergies, hypothyroidism who presented to the emergency department complaints of decreased appetite, nausea, lower abdominal pain, constipation and low-grade fever for the past few days.     Patient has known colovesicular and colovaginal fistula discovered earlier this month after recurrent infections.  CT abdomen pelvis without contrast done just 4 days prior to admission shows increased large volume of gas within the bladder.  Of note she just recently completed chemotherapy the first week of April.  As such urology surgery and oncology have been called in consult.  Hospitalist has was called for admission.   She underwent sigmoid colectomy, loop colostomy, and bladder repair 4/24 and has remained stable postoperatively. Please see details below.  Assessment and Plan: Sepsis secondary to colovesical/colovaginal fistulas, POA:  - now s/p sigmoid resection with anastomosis, diverting loop ileostomy, takedown  of colovesical fistula with intraoperative bladder repair by Drs. [name] and Borden 09/01/2022.  - Surgery cleared for discharge. Follow up is scheduled 5/6. - Urology follow up in 6 weeks for ureteral stents. Cystogram 4/29 showed no leak. Voiding well. - No further abx required. - Has received comprehensive, repeated ostomy training and given supplies with HHRN arranged.    Postoperativ epain: Improving, no oxycodone since 4/28.  - After PDMP review, prescribed very short course of oxycodone prn at DC along with zofran prn.    ABLA on AOCD:  - s/p 1u PRBCs 4/25, 1u PRBC 4/26. Hgb up to 9.3g/dl with improved exertional symptoms. No bleeding.   Stage IIIa CKD: CrCl settled out just below 48ml/min.  - Recheck at follow up is suggested.   Right ovarian epithelial cancer: Completed chemotherapy first week of April - Follow-up with Dr. Bertis Ruddy as rescheduled to 5/23   Hypothyroidism: TSH 4.240 - Continue synthroid   HTN:  - Restarted home atenolol, norvasc. PCP follow up recommended for this.   HLD:  - Restart statin    Consultants: Surgery, urology, WOC RN, oncology Procedures performed:  09/01/22 LAPAROSCOPIC SIGMOIDECTOMY CONVERTED TO OPEN, TAKEDOWN OF COLOVESICAL FISTULA AND LOOP COLOSTOMY [name], [name], MD  Disposition: Home Diet recommendation: Soft DISCHARGE MEDICATION: Allergies as of 09/08/2022       Reactions   Paclitaxel Anaphylaxis   Emend [fosaprepitant Dimeglumine] Other (See Comments)   3 minutes into emend infusion patient began having facial flushing and redness to bilateral hands and neck, and light headedness   Codeine Nausea And Vomiting   Nsaids Other (See Comments)   Told to avoid due to kidney function   Penicillins Hives   Vibra-tab [doxycycline] Hives  Iodine Rash   Latex Rash   Occasionally gets a localized rash on contact with certain latex products        Medication List     STOP taking these medications    acetaminophen 650 MG  CR tablet Commonly known as: TYLENOL Replaced by: acetaminophen 500 MG tablet   HYDROcodone-acetaminophen 5-325 MG tablet Commonly known as: Norco   promethazine 12.5 MG tablet Commonly known as: PHENERGAN   sulfamethoxazole-trimethoprim 400-80 MG tablet Commonly known as: BACTRIM       TAKE these medications    acetaminophen 500 MG tablet Commonly known as: TYLENOL Take 2 tablets (1,000 mg total) by mouth every 6 (six) hours as needed for mild pain. Replaces: acetaminophen 650 MG CR tablet   amLODipine 5 MG tablet Commonly known as: NORVASC Take 5 mg by mouth daily.   aspirin EC 81 MG tablet Take 81 mg by mouth at bedtime. Swallow whole.   atenolol 25 MG tablet Commonly known as: TENORMIN Take 0.5 tablets (12.5 mg total) by mouth 2 (two) times daily.   CALCIUM + VITAMIN D3 PO Take 1 tablet by mouth 2 (two) times daily.   EPINEPHrine 0.3 mg/0.3 mL Soaj injection Commonly known as: EPI-PEN Inject 0.3 mg into the muscle as needed for anaphylaxis.   levothyroxine 100 MCG tablet Commonly known as: SYNTHROID Take 1 tablet (100 mcg total) by mouth in the morning.   loperamide 2 MG capsule Commonly known as: IMODIUM Take 1 capsule (2 mg total) by mouth daily for 5 days.   multivitamin tablet Take 1 tablet by mouth in the morning.   olaparib 100 MG tablet Commonly known as: LYNPARZA Take 1 tablet (100 mg total) by mouth 2 (two) times daily. Swallow whole. May take with food to decrease nausea and vomiting.   ondansetron 4 MG tablet Commonly known as: ZOFRAN Take 1 tablet (4 mg total) by mouth every 6 (six) hours as needed for nausea. What changed:  medication strength how much to take when to take this reasons to take this   oxyCODONE 5 MG immediate release tablet Commonly known as: Oxy IR/ROXICODONE Take 1 tablet (5 mg total) by mouth every 6 (six) hours as needed for severe pain.   simvastatin 20 MG tablet Commonly known as: ZOCOR Take 20 mg by mouth  daily.        Follow-up Information     [name], [name], MD. Go on 09/29/2022.   Specialty: General Surgery Why: 5/22 at 10:50am We are making a follow up appointment for you., Please arrive 15 minutes early to complete check in, and bring photo ID and insurance card. Contact information: [address] [place] [address] [phone]         [facility] [place]. Go on 09/13/2022.   Specialty: General Surgery Why: Nurse visit 5/6 at 9:30am Please arrive 30 minutes early to complete check in, and bring photo ID and insurance card. Contact information: [address] [place] [address] [phone]        [facility], [facility] Follow up.   Specialty: Home Health Services Why: [facility] nursing/physical/occupational therapy Contact information: [address] [place] [address] [phone]         Raliegh Ip, DO Follow up.   Specialty: Family Medicine Contact information: [address] [place] [address] [phone]         [name], MD Follow up on 09/30/2022.   Specialty:  Hematology and Oncology Contact information: [address] [place] [address] [phone]                Discharge Exam: Ceasar Mons Weights   08/27/22 0743  Weight: 71.7 kg  BP 128/72 (BP Location: Left Arm)   Pulse 87   Temp 98.2 F (36.8 C) (Oral)   Resp 17   Ht 5\' 6"  (1.676 m)   Wt 71.7 kg   SpO2 98%   BMI 25.50 kg/m   No distress Clear, nonlabored RRR, no MRG or edema or JVD Soft, mildly TTP, midline incision healing well, R sided ostomy healthy without bleeding or infectious signs.   Condition at discharge: stable  The results of significant diagnostics from this hospitalization (including imaging, microbiology, ancillary and laboratory) are listed below for reference.   Imaging Studies: DG Cystogram  Result Date: 09/06/2022 CLINICAL DATA:  Status  post repair of colovesical fistula. EXAM: CYSTOGRAM TECHNIQUE: Bladder was filled with 250 mL Cysto-Hypaque 30% by drip infusion through the existing Foley catheter. Serial spot images were obtained during bladder filling and post draining. FLUOROSCOPY: Radiation Exposure Index (as provided by the fluoroscopic device): 26.1 mGy Kerma COMPARISON:  09/01/2022 and CT 08/27/2018 for FINDINGS: Scout image demonstrates bilateral ureter stents and a surgical drain in the pelvis. Images were obtained as the bladder was filled with contrast. There is no evidence for a bladder leak. Contrast refluxed into both ureter stents and filled the renal pelvises bilaterally. No evidence for a colonic fistula. There is no contrast filling the surgical drain. Postvoid image demonstrates an empty urinary bladder with residual contrast in the renal pelvises. Again noted are dilated renal pelvises, left side greater than right. IMPRESSION: 1. No evidence for a bladder leak or colonic fistula. 2. Patent bilateral ureter stents with bilateral ureterovesical reflux. Electronically Signed   By: Richarda Overlie M.D.   On: 09/06/2022 09:45   DG C-Arm 1-60 Min-No Report  Result Date: 09/01/2022 Fluoroscopy was utilized by the requesting physician.  No radiographic interpretation.   CT ABDOMEN PELVIS WO CONTRAST  Result Date: 08/27/2022 CLINICAL DATA:  Worsening abdominal pain with history of ovarian cancer. Suspected colovaginal and/or colovesical fistula. Low-grade fever. EXAM: CT ABDOMEN AND PELVIS WITHOUT CONTRAST TECHNIQUE: Multidetector CT imaging of the abdomen and pelvis was performed following the standard protocol without IV contrast. RADIATION DOSE REDUCTION: This exam was performed according to the departmental dose-optimization program which includes automated exposure control, adjustment of the mA and/or kV according to patient size and/or use of iterative reconstruction technique. COMPARISON:  CT without contrast 08/23/2022, CT  with oral contrast only 08/14/2022, CT without contrast 06/13/2022 FINDINGS: Lower chest: No acute abnormality. Coronary artery calcifications and minimal chronic pericardial effusion are again noted. Normal heart size. The cardiac blood pool is less dense than the myocardium, consistent with anemia. This was seen previously. Hepatobiliary: There is a 1.1 cm cyst again noted in hepatic segment 8, Hounsfield density of 7.6. No other focal abnormality of liver is visible without contrast. The gallbladder is unremarkable. Prominent common bile duct is again noted measuring 12 mm, unchanged. Pancreas: No abnormality. Spleen: No abnormality.  No splenomegaly. Adrenals/Urinary Tract: There is no adrenal mass. Severe chronic atrophy again noted left kidney with small cysts. Right renal cortex is normal in attenuation. There has developed bilateral moderate hydroureteronephrosis into the pelvis where the ureters are difficult to follow due to the pre-existing ongoing inflammatory process along the sigmoid colon. There is likely ureteral tethering due to the  inflammatory process contributing to obstructive uropathy. No stone disease is seen. The bladder is distended with air and fluid. There is a moderate amount of patchy enteric contrast and settling in the dependent bladder on the left-greater-than-right and now noted a demonstrable fistulous tract from the diseased abutting sigmoid colon for the left dorsal wall of the bladder, best seen on 2:64 and 65 and on sagittal reconstruction images 79-86. There previously was no enteric contrast in the bladder. Stomach/Bowel: The stomach is contracted. A 3.5 cm descending duodenal diverticulum is again noted, with air contrast level. No small bowel dilatation or inflammation is seen. The appendix is not seen. There is moderate retained dense contrasted stool again in the ascending, transverse descending colon. Diverticulosis greatest in the sigmoid is seen with continued sigmoid  wall thickening and inflammatory stranding. Further evaluation to exclude underlying colonic lesion is recommended when clinically feasible. Inflammatory reaction around the diseased segment has increased since April 15 and certainly since April 6 as well. Despite the ongoing inflammatory process no free air or abscess is seen in the area. No focal rectal abnormality is seen. Vascular/Lymphatic: Aortic atherosclerosis. No enlarged abdominal or pelvic lymph nodes. Reproductive: There previously was a well seen colovaginal fistula from the undersurface of the sigmoid colon into the left superior vaginal cavity, but this is not as well seen today. Contrast in the vagina is noted however, consistent with continued patency of the tract. The uterus is absent. No adnexal mass is seen. Other: Small amounts of increased pelvic reactive fluid, increased trace reactive fluid along the distal left paracolic gutter. No free air, free hemorrhage or abscess is seen. There are no incarcerated hernias. Musculoskeletal: There is dextroscoliosis, osteopenia and advanced degenerative change of the lumbar spine, with acquired spinal stenosis again noted L4-5. No destructive osseous or lytic lesions. IMPRESSION: 1. Continued sigmoid wall thickening and inflammatory reaction with diverticulosis with increased inflammation and layering reactive pelvic fluid since April 15. Further evaluation to exclude underlying colonic lesion is recommended when clinically feasible. 2. There is now a demonstrable fistulous tract from the sigmoid colon into the left dorsal bladder wall, with air and fluid in the bladder and patchy enteric contrast now settling in the dependent bladder. 3. The known left-sided colovaginal fistula is not well seen today but there is contrast in the vagina consistent with continued tract patency. 4. There is now moderate bilateral hydroureteronephrosis into the pelvis where the ureters are difficult to follow due to the  pre-existing inflammatory process, with ureteral tethering by inflammatory process most likely causing the obstructive uropathy. No stone disease is seen. 5. Severe chronic atrophy of the left kidney. 6. Aortic and coronary artery atherosclerosis. 7. Anemia. 8. Prominent common bile duct at 12 mm, unchanged. 9. Constipation. 10. Osteopenia, scoliosis, and advanced degenerative change of the lumbar spine with acquired spinal stenosis L4-5. 11. These results will be called to the ordering clinician or representative by the Radiologist Assistant, and communication documented in the PACS or [employer]. Aortic Atherosclerosis (ICD10-I70.0). Electronically Signed   By: Almira Bar M.D.   On: 08/27/2022 21:39   CT ABDOMEN PELVIS WO CONTRAST  Result Date: 08/23/2022 CLINICAL DATA:  Abdominal pain, acute, non localized, colovaginal fistula on prior imaging with worsening abdominal pain. Urinary retention EXAM: CT ABDOMEN AND PELVIS WITHOUT CONTRAST TECHNIQUE: Multidetector CT imaging of the abdomen and pelvis was performed following the standard protocol without IV contrast. RADIATION DOSE REDUCTION: This exam was performed according to the departmental dose-optimization program which includes  automated exposure control, adjustment of the mA and/or kV according to patient size and/or use of iterative reconstruction technique. COMPARISON:  CT abdomen and pelvis 08/14/2022 FINDINGS: Lower chest: No acute abnormality. Hepatobiliary: No suspicious focal hepatic lesion. Mild distention of the gallbladder. Unchanged dilation of the common bile duct measuring up to 14 mm. No radiopaque stones or evidence of cholecystitis. Pancreas: Unremarkable. Spleen: Unremarkable. Adrenals/Urinary Tract: Normal adrenal glands. Severe atrophy of the left kidney. Normal appearance of the right kidney. No urinary calculi. Slight decrease in pelviectasis and dilation of the left ureter. Increased large volume gas within the bladder. The  inflamed sigmoid colon abuts the superior left aspect of the bladder. Given the increased large amount of gas a colovesical fistula is difficult to exclude. Stomach/Bowel: Diverticulosis and wall thickening of the sigmoid colon with adjacent inflammatory change is similar to slightly decreased from 08/14/2022. Small amount of pneumatosis in the sigmoid colon near the area of the colovaginal fistula. The previous collection of gas involving the wall of the sigmoid colon has decreased. There is a small amount of residual gas in the area of the left vaginal cuff (series 2/image 67). There is again a soft tissue tract extending from the vaginal cuff to the sigmoid colon (circa series 5/image 39) though no gas is seen within this tract. Normal caliber large and small bowel. Moderate colonic stool load mixed with contrast. Decreased size and wall thickening about the large diverticulum in the proximal transverse colon (series 5/image 52). Redemonstrated large duodenal diverticulum. Vascular/Lymphatic: Aortic atherosclerosis. No enlarged abdominal or pelvic lymph nodes. Reproductive: Hysterectomy.  No adnexal mass. Other: Small volume free fluid in the pelvis. No free intraperitoneal air. Musculoskeletal: No acute osseous abnormality. IMPRESSION: 1. Increased large volume gas within the bladder. This raises concern for colovesical fistula. Consider CT cystogram for further evaluation. 2. Similar to slightly decreased inflammatory changes surrounding the sigmoid colon. The previous collection of gas involving the wall of the sigmoid colon has decreased. There is a small amount of residual gas in the area of the left vaginal cuff. There is again a soft tissue tract extending from the vaginal cuff to the sigmoid colon, though no gas is seen within this tract. 3. Decreased size and wall thickening about the large diverticulum in the proximal transverse colon. 4. Slight decrease in pelviectasis and dilation of the left ureter.  Aortic Atherosclerosis (ICD10-I70.0). Electronically Signed   By: Minerva Fester M.D.   On: 08/23/2022 21:39   CT ABDOMEN PELVIS WO CONTRAST  Result Date: 08/14/2022 CLINICAL DATA:  Pelvic pain; history of ovarian cancer EXAM: CT ABDOMEN AND PELVIS WITHOUT CONTRAST TECHNIQUE: Multidetector CT imaging of the abdomen and pelvis was performed following the standard protocol without IV contrast. RADIATION DOSE REDUCTION: This exam was performed according to the departmental dose-optimization program which includes automated exposure control, adjustment of the mA and/or kV according to patient size and/or use of iterative reconstruction technique. COMPARISON:  Multiple priors, most recent CT abdomen and pelvis dated June 13, 2022 FINDINGS: Lower chest: No acute abnormality. Hepatobiliary: Stable small low-attenuation lesion of the right lobe of the liver measuring 7 mm on series 2, image 14, likely a simple cysts. Gallbladder is unremarkable. Biliary ductal dilation. Pancreas: Unremarkable. No pancreatic ductal dilatation or surrounding inflammatory changes. Spleen: Normal in size without focal abnormality. Adrenals/Urinary Tract: Bilateral adrenal glands are unremarkable. Atrophic left kidney. New pelviectasis and mild dilation of the left ureter. Low-attenuation lesions of the left kidney, largest are compatible with simple cysts,  others are too small to accurately characterize, no specific follow-up imaging needed. Compensatory hypertrophy of the right kidney. No hydronephrosis. Wall thickening of the left lateral wall of the urinary bladder with air seen within the urinary bladder. Stomach/Bowel: Contrast material is seen throughout the small and large bowel. Wall thickening of the sigmoid colon with adjacent inflammatory change, decreased when compared with prior exam. New focal collection of gas involving the wall of the sigmoid colon which extends inferiorly to the area of the left vaginal cuff with  contrast material seen within the vaginal cuff. Increased wall thickening of a large colonic diverticulum of the proximal transverse colon, best seen on series 5, image 61. Large duodenal diverticulum. No evidence of obstruction. Vascular/Lymphatic: Aortic atherosclerosis. No enlarged abdominal or pelvic lymph nodes. Reproductive: No adnexal masses. Other: Postsurgical changes of the anterior abdomen. Musculoskeletal: No acute or significant osseous findings. IMPRESSION: 1. Wall thickening of the sigmoid colon with adjacent inflammatory change, overall improved when compared with prior exam. New focal collection of gas involving the wall of the sigmoid colon which extends inferiorly to the area of the left vaginal cuff with contrast material seen within the vaginal cuff. 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 2. Wall thickening of the left lateral wall of the urinary bladder with air seen within the urinary bladder, findings raise concern for additional area of fistulization to the bladder. CT cystogram could be performed for better evaluation. 3. New pelviectasis and mild dilation of the left ureter, likely secondary to inflammatory change at the area of the left ureterovesicular junction. Chronic atrophy of the left kidney. 4. Increased wall thickening of a large colonic diverticulum of the proximal transverse colon, likely due to diverticulitis. 5. Aortic Atherosclerosis (ICD10-I70.0). Electronically Signed   By: Allegra Lai M.D.   On: 08/14/2022 17:06    Microbiology: Results for orders placed or performed during the Weber encounter of 08/27/22  Culture, blood (Routine X 2) w Reflex to ID Panel     Status: None   Collection Time: 08/27/22 11:00 AM   Specimen: BLOOD  Result Value Ref Range Status   Specimen Description   Final    BLOOD SITE NOT SPECIFIED Performed at [facility], [address], [place], [place] [address]    Special Requests   Final     BOTTLES DRAWN AEROBIC AND ANAEROBIC Blood Culture results may not be optimal due to an excessive volume of blood received in culture bottles Performed at [facility], [address] [place], [place] [address]    Culture   Final    NO GROWTH 5 DAYS Performed at [facility], [address] [place], [place] [address]    Report Status 09/01/2022 FINAL  Final  Culture, blood (Routine X 2) w Reflex to ID Panel     Status: None   Collection Time: 08/27/22 11:00 AM   Specimen: BLOOD  Result Value Ref Range Status   Specimen Description   Final    BLOOD BLOOD LEFT WRIST Performed at [facility], [address] [place], [place] [address]    Special Requests   Final    BOTTLES DRAWN AEROBIC AND ANAEROBIC Blood Culture results may not be optimal due to an excessive volume of blood received in culture bottles Performed at [facility], [address], [place], [place] [address]    Culture   Final    NO GROWTH 5 DAYS Performed at [facility], [address] [place], [place] [address]    Report Status 09/01/2022 FINAL  Final  Urine Culture     Status: Abnormal   Collection Time: 08/27/22  4:39 PM   Specimen: Urine, Random  Result Value Ref Range Status   Specimen Description   Final    URINE, RANDOM Performed at [facility], [address], [place], [place] [address]    Special Requests   Final    NONE Reflexed from [id number] Performed at [facility], [address], [place], [place] [address]    Culture (A)  Final    >=100,000 COLONIES/mL STAPHYLOCOCCUS HAEMOLYTICUS 20,000 COLONIES/mL ENTEROCOCCUS FAECIUM    Report Status 09/01/2022 FINAL  Final   Organism ID, Bacteria STAPHYLOCOCCUS HAEMOLYTICUS (A)  Final   Organism ID, Bacteria ENTEROCOCCUS FAECIUM (A)  Final      Susceptibility   Enterococcus faecium - MIC*    AMPICILLIN <=2 SENSITIVE Sensitive     NITROFURANTOIN 64  INTERMEDIATE Intermediate     VANCOMYCIN <=0.5 SENSITIVE Sensitive     * 20,000 COLONIES/mL ENTEROCOCCUS FAECIUM   Staphylococcus haemolyticus - MIC*    CIPROFLOXACIN >=8 RESISTANT Resistant     GENTAMICIN >=16 RESISTANT Resistant     NITROFURANTOIN <=16 SENSITIVE Sensitive     OXACILLIN >=4 RESISTANT Resistant     TETRACYCLINE 2 SENSITIVE Sensitive     VANCOMYCIN 1 SENSITIVE Sensitive     TRIMETH/SULFA 160 RESISTANT Resistant     CLINDAMYCIN >=8 RESISTANT Resistant     RIFAMPIN >=32 RESISTANT Resistant     Inducible Clindamycin NEGATIVE Sensitive     * >=100,000 COLONIES/mL STAPHYLOCOCCUS HAEMOLYTICUS    Labs: CBC: Recent Labs  Lab 09/02/22 0238 09/03/22 0334 09/04/22 0442 09/05/22 0251 09/06/22 0200  WBC 10.3 8.2 6.1 6.2 6.4  HGB 7.2* 7.5* 8.7* 9.6* 9.3*  HCT 22.5* 22.4* 25.4* 29.2* 28.5*  MCV 100.4* 96.1 94.1 94.5 94.7  PLT 149* 150 175 242 259   Basic Metabolic Panel: Recent Labs  Lab 09/02/22 0238 09/03/22 0334 09/04/22 0442 09/05/22 0251 09/06/22 0200  NA 133* 132* 132* 131* 132*  K 4.3 4.1 3.9 3.8 3.8  CL 100 101 101 98 100  CO2 24 24 23 23 25   GLUCOSE 159* 106* 94 90 91  BUN 7* 17 11 9 11   CREATININE 0.92 1.11* 0.79 0.79 0.77  CALCIUM 7.7* 7.3* 7.5* 7.4* 7.7*   Liver Function Tests: No results for input(s): "AST", "ALT", "ALKPHOS", "BILITOT", "PROT", "ALBUMIN" in the last 168 hours. CBG: Recent Labs  Lab 09/03/22 1546  GLUCAP 119*    Discharge time spent: greater than 30 minutes.  Signed: Tyrone Nine, MD [facility] 09/08/2022

## 2022-09-08 NOTE — Plan of Care (Signed)
  Problem: Education: Goal: Knowledge of General Education information will improve Description: Including pain rating scale, medication(s)/side effects and non-pharmacologic comfort measures Outcome: Progressing   Problem: Health Behavior/Discharge Planning: Goal: Ability to manage health-related needs will improve Outcome: Progressing   Problem: Pain Managment: Goal: General experience of comfort will improve Outcome: Progressing   Problem: Safety: Goal: Ability to remain free from injury will improve Outcome: Progressing   Problem: Skin Integrity: Goal: Risk for impaired skin integrity will decrease Outcome: Progressing   Problem: Education: Goal: Understanding of discharge needs will improve Outcome: Progressing Goal: Verbalization of understanding of the causes of altered bowel function will improve Outcome: Progressing

## 2022-09-08 NOTE — Progress Notes (Signed)
Progress Note  7 Days Post-Op  Subjective: Feeling well this am and ready to go home. Ileostomy output has thickened some from yesterday   Objective: Vital signs in last 24 hours: Temp:  [98.2 F (36.8 C)-98.6 F (37 C)] 98.2 F (36.8 C) (05/01 0539) Pulse Rate:  [86-88] 87 (05/01 0539) Resp:  [17-18] 17 (05/01 0539) BP: (128-147)/(72-77) 128/72 (05/01 0539) SpO2:  [98 %-99 %] 98 % (05/01 0539) Last BM Date : 09/07/22  Intake/Output from previous day: 04/30 0701 - 05/01 0700 In: 240 [P.O.:240] Out: 1165 [Urine:700; Drains:65; Stool:400] Intake/Output this shift: No intake/output data recorded.  PE: Gen: NAD Resp: nonlabored on room air Abd: soft, mild TTP in right abdomen without rebound or guarding. incision with honeycomb CDI removed by me this am.  Ileostomy stoma pink and viable and thin stool in pouch.  JP with serous output   Lab Results:  Recent Labs    09/06/22 0200  WBC 6.4  HGB 9.3*  HCT 28.5*  PLT 259    BMET Recent Labs    09/06/22 0200  NA 132*  K 3.8  CL 100  CO2 25  GLUCOSE 91  BUN 11  CREATININE 0.77  CALCIUM 7.7*    PT/INR No results for input(s): "LABPROT", "INR" in the last 72 hours. CMP     Component Value Date/Time   NA 132 (L) 09/06/2022 0200   NA 134 06/01/2022 0848   K 3.8 09/06/2022 0200   CL 100 09/06/2022 0200   CO2 25 09/06/2022 0200   GLUCOSE 91 09/06/2022 0200   BUN 11 09/06/2022 0200   BUN 7 (L) 06/01/2022 0848   CREATININE 0.77 09/06/2022 0200   CREATININE 0.93 08/10/2022 0857   CREATININE 0.99 02/05/2013 0757   CALCIUM 7.7 (L) 09/06/2022 0200   PROT 5.4 (L) 08/28/2022 0100   PROT 6.0 06/01/2022 0848   ALBUMIN 2.5 (L) 08/28/2022 0100   ALBUMIN 3.6 (L) 06/01/2022 0848   AST 13 (L) 08/28/2022 0100   AST 12 (L) 08/10/2022 0857   ALT 12 08/28/2022 0100   ALT 11 08/10/2022 0857   ALKPHOS 35 (L) 08/28/2022 0100   BILITOT 0.5 08/28/2022 0100   BILITOT 0.3 08/10/2022 0857   GFRNONAA >60 09/06/2022 0200    GFRNONAA >60 08/10/2022 0857   GFRNONAA 59 (L) 02/05/2013 0757   GFRAA 61 05/20/2020 0850   GFRAA 68 02/05/2013 0757   Lipase     Component Value Date/Time   LIPASE 13 08/23/2022 2013       Studies/Results: No results found.  Anti-infectives: Anti-infectives (From admission, onward)    Start     Dose/Rate Route Frequency Ordered Stop   09/05/22 2200  cefTRIAXone (ROCEPHIN) 2 g in sodium chloride 0.9 % 100 mL IVPB        2 g 200 mL/hr over 30 Minutes Intravenous Every 24 hours 09/05/22 0914 09/05/22 2151   09/05/22 1000  metroNIDAZOLE (FLAGYL) IVPB 500 mg        500 mg 100 mL/hr over 60 Minutes Intravenous Every 12 hours 09/05/22 0914 09/06/22 1011   09/01/22 2200  linezolid (ZYVOX) tablet 600 mg        600 mg Oral Every 12 hours 09/01/22 1255 09/04/22 0950   09/01/22 0600  cefoTEtan (CEFOTAN) 2 g in sodium chloride 0.9 % 100 mL IVPB        2 g 200 mL/hr over 30 Minutes Intravenous On call to O.R. 08/31/22 1204 09/01/22 1244   08/31/22 1400  neomycin (MYCIFRADIN) tablet 1,000 mg       See Hyperspace for full Linked Orders Report.   1,000 mg Oral 3 times per day 08/31/22 1204 08/31/22 2110   08/31/22 1400  metroNIDAZOLE (FLAGYL) tablet 1,000 mg       See Hyperspace for full Linked Orders Report.   1,000 mg Oral 3 times per day 08/31/22 1204 08/31/22 2109   08/30/22 1415  nitrofurantoin (macrocrystal-monohydrate) (MACROBID) capsule 100 mg  Status:  Discontinued        100 mg Oral Every 12 hours 08/30/22 1329 09/01/22 1255   08/27/22 2200  cefTRIAXone (ROCEPHIN) 2 g in sodium chloride 0.9 % 100 mL IVPB  Status:  Discontinued        2 g 200 mL/hr over 30 Minutes Intravenous Every 24 hours 08/27/22 1137 09/05/22 0914   08/27/22 1030  cefTRIAXone (ROCEPHIN) 1 g in sodium chloride 0.9 % 100 mL IVPB  Status:  Discontinued        1 g 200 mL/hr over 30 Minutes Intravenous Every 24 hours 08/27/22 1021 08/27/22 1137   08/27/22 1030  metroNIDAZOLE (FLAGYL) IVPB 500 mg  Status:   Discontinued        500 mg 100 mL/hr over 60 Minutes Intravenous Every 12 hours 08/27/22 1021 09/05/22 0914        Assessment/Plan  77 yo female with colovesical fistula with recurrent UTIs POD#7 - s/p  laparoscopic converted to open sigmoid resection with anastomosis, diverting loop ileostomy, take down of colovesical fistula 4/24 Dr. Sheliah Hatch Intraoperative bladder repair - Dr. Laverle Patter   - started qd imodium yesterday with appropriate decrease in output. Continue on dc. Return precautions discussed - can discontinue abx from surgical standpoint - encouraged ambulation and IS use - cystogram without evidence of leak and foley out - dc JP drain  Stable for dc from surgical standpoint  FEN - soft diet VTE - lovenox ID - rocephin/flagyl, linezolid    LOS: 12 days   Eric Form, [facility] 09/08/2022, 11:17 AM Please see Amion for pager number during day hours 7:00am-4:30pm

## 2022-09-08 NOTE — Consult Note (Signed)
   [facility] CM Inpatient Consult   09/08/2022  NAYLENE FOELL [birth year] [id number]  [facility] [[facility]]  Accountable Care Organization [ACO] Patient: Medicare ACO REACH  Primary Care Provider:  Raliegh Ip, DO with [facility]  Remote review patient at [facility] 12 days LOS  Referral request:  Extreme high risk; readmission prevention  Patient evaluated for community based chronic complex disease management services for care coordiation with [facility] Care Management Program as a benefit of patient's [employer]. Patient with high risk follow up needs.   Plan:  Referral request placed - Patient will receive post hospital discharge call and will be evaluated for care coordination follow up and support.    Of note, [facility] Care Management services does not replace or interfere with any services that are arranged by inpatient case management or social work.  For additional questions or referrals please contact:    Charlesetta Shanks, RN BSN CCM [facility]  [phone] business mobile phone Toll free office [phone]  *Concierge Line  [phone] Fax number: [phone] [email or web address]

## 2022-09-08 NOTE — TOC Transition Note (Signed)
Transition of Care [facility]) - CM/SW Discharge Note   Patient Details  Name: April Weber MRN: [id number] Date of Birth: 1946-01-11  [facility]) CM/SW Contact:  Lanier Clam, RN Phone Number: 09/08/2022, 9:45 AM   Clinical Narrative:  Frances Furbish rep cory aware of d/c today HHRN/PT/OT. No further CM needs.     Final next level of care: Home w Home Health Services Barriers to Discharge: No Barriers Identified   Patient Goals and CMS Choice      Discharge Placement                         Discharge Plan and Services Additional resources added to the After Visit Summary for                            HH Arranged: RN, PT [facility] Agency: [facility] Date [facility] Agency Contacted: 09/06/22 Time HH Agency Contacted: 1507 Representative spoke with at [facility] Agency: Kandee Keen  Social Determinants of Health (SDOH) Interventions SDOH Screenings   Food Insecurity: No Food Insecurity (08/27/2022)  Housing: Low Risk  (08/27/2022)  Transportation Needs: No Transportation Needs (08/27/2022)  Utilities: Not At Risk (08/27/2022)  Alcohol Screen: Low Risk  (08/26/2021)  Depression (PHQ2-9): Low Risk  (06/01/2022)  Financial Resource Strain: Low Risk  (04/08/2022)  Physical Activity: Sufficiently Active (08/26/2021)  Social Connections: Moderately Integrated (08/26/2021)  Stress: No Stress Concern Present (08/26/2021)  Tobacco Use: Medium Risk (09/02/2022)     Readmission Risk Interventions    08/30/2022    2:22 PM 06/14/2022    2:06 PM  Readmission Risk Prevention Plan  Transportation Screening Complete Complete  PCP or Specialist Appt within 3-5 Days Complete Complete  HRI or Home Care Consult Complete Complete  Social Work Consult for Recovery Care Planning/Counseling Complete Complete  Palliative Care Screening Not Applicable Not Applicable  Medication Review Oceanographer) Complete Referral to Pharmacy

## 2022-09-09 ENCOUNTER — Telehealth: Payer: Self-pay | Admitting: Family Medicine

## 2022-09-09 ENCOUNTER — Ambulatory Visit (HOSPITAL_BASED_OUTPATIENT_CLINIC_OR_DEPARTMENT_OTHER): Payer: Medicare Other

## 2022-09-09 ENCOUNTER — Inpatient Hospital Stay: Payer: Medicare Other | Admitting: Hematology and Oncology

## 2022-09-09 ENCOUNTER — Inpatient Hospital Stay: Payer: Medicare Other

## 2022-09-09 NOTE — Telephone Encounter (Signed)
Called and spoke with patient. She states she wants Dr. Reece Agar to look over all her hospital notes and see if she thinks she needs to be on blood pressure Medication. She is aware Dr. Reece Agar is off today and will review tomorrow.

## 2022-09-10 ENCOUNTER — Telehealth: Payer: Self-pay | Admitting: *Deleted

## 2022-09-10 ENCOUNTER — Encounter: Payer: Self-pay | Admitting: *Deleted

## 2022-09-10 NOTE — Telephone Encounter (Signed)
Whatever she was being given at time of discharge was working well for her.  Her BP was normal.  I recommend continuing whatever she was recommend to at discharge.  Additionally, she is supposed to have CBC done 1-2 weeks following d/c so I recommend that if she is not willing to come in for hospital follow up then we see if we can arrange a draw with her [facility] RN

## 2022-09-10 NOTE — Progress Notes (Signed)
  Care Coordination   Note   09/10/2022 Name: TAYLA FRESCO MRN: [id number] DOB: 1946-03-31  CHERINA SPARHAWK is a 77 y.o. year old female who sees Raliegh Ip, DO for primary care. I reached out to [name] by phone today to offer care coordination services.  Ms. Sanguinetti was given information about Care Coordination services today including:   The Care Coordination services include support from the care team which includes your Nurse Coordinator, Clinical Social Worker, or Pharmacist.  The Care Coordination team is here to help remove barriers to the health concerns and goals most important to you. Care Coordination services are voluntary, and the patient may decline or stop services at any time by request to their care team member.   Care Coordination Consent Status: Patient agreed to services and verbal consent obtained.   Follow up plan:  Telephone appointment with care coordination team member scheduled for:  09/14/2022   Encounter Outcome:  Pt. Scheduled from referral   Burman Nieves, [facility] Care Coordination Care Guide Direct Dial: [phone]

## 2022-09-11 DIAGNOSIS — I700 Atherosclerosis of aorta: Secondary | ICD-10-CM | POA: Diagnosis not present

## 2022-09-11 DIAGNOSIS — M419 Scoliosis, unspecified: Secondary | ICD-10-CM | POA: Diagnosis not present

## 2022-09-11 DIAGNOSIS — I2510 Atherosclerotic heart disease of native coronary artery without angina pectoris: Secondary | ICD-10-CM | POA: Diagnosis not present

## 2022-09-11 DIAGNOSIS — E039 Hypothyroidism, unspecified: Secondary | ICD-10-CM | POA: Diagnosis not present

## 2022-09-11 DIAGNOSIS — N1831 Chronic kidney disease, stage 3a: Secondary | ICD-10-CM | POA: Diagnosis not present

## 2022-09-11 DIAGNOSIS — I129 Hypertensive chronic kidney disease with stage 1 through stage 4 chronic kidney disease, or unspecified chronic kidney disease: Secondary | ICD-10-CM | POA: Diagnosis not present

## 2022-09-11 DIAGNOSIS — E785 Hyperlipidemia, unspecified: Secondary | ICD-10-CM | POA: Diagnosis not present

## 2022-09-11 DIAGNOSIS — Z86718 Personal history of other venous thrombosis and embolism: Secondary | ICD-10-CM | POA: Diagnosis not present

## 2022-09-11 DIAGNOSIS — N1330 Unspecified hydronephrosis: Secondary | ICD-10-CM | POA: Diagnosis not present

## 2022-09-11 DIAGNOSIS — Z432 Encounter for attention to ileostomy: Secondary | ICD-10-CM | POA: Diagnosis not present

## 2022-09-11 DIAGNOSIS — Z853 Personal history of malignant neoplasm of breast: Secondary | ICD-10-CM | POA: Diagnosis not present

## 2022-09-11 DIAGNOSIS — D631 Anemia in chronic kidney disease: Secondary | ICD-10-CM | POA: Diagnosis not present

## 2022-09-11 DIAGNOSIS — K5730 Diverticulosis of large intestine without perforation or abscess without bleeding: Secondary | ICD-10-CM | POA: Diagnosis not present

## 2022-09-11 DIAGNOSIS — Z9049 Acquired absence of other specified parts of digestive tract: Secondary | ICD-10-CM | POA: Diagnosis not present

## 2022-09-11 DIAGNOSIS — Z9181 History of falling: Secondary | ICD-10-CM | POA: Diagnosis not present

## 2022-09-11 DIAGNOSIS — M48061 Spinal stenosis, lumbar region without neurogenic claudication: Secondary | ICD-10-CM | POA: Diagnosis not present

## 2022-09-11 DIAGNOSIS — Z87440 Personal history of urinary (tract) infections: Secondary | ICD-10-CM | POA: Diagnosis not present

## 2022-09-11 DIAGNOSIS — M47816 Spondylosis without myelopathy or radiculopathy, lumbar region: Secondary | ICD-10-CM | POA: Diagnosis not present

## 2022-09-11 DIAGNOSIS — E871 Hypo-osmolality and hyponatremia: Secondary | ICD-10-CM | POA: Diagnosis not present

## 2022-09-11 DIAGNOSIS — Z48815 Encounter for surgical aftercare following surgery on the digestive system: Secondary | ICD-10-CM | POA: Diagnosis not present

## 2022-09-11 DIAGNOSIS — C561 Malignant neoplasm of right ovary: Secondary | ICD-10-CM | POA: Diagnosis not present

## 2022-09-11 DIAGNOSIS — D630 Anemia in neoplastic disease: Secondary | ICD-10-CM | POA: Diagnosis not present

## 2022-09-11 DIAGNOSIS — I739 Peripheral vascular disease, unspecified: Secondary | ICD-10-CM | POA: Diagnosis not present

## 2022-09-13 NOTE — Telephone Encounter (Signed)
Pt says the hospital prescribed her Aspirin 81mg  but she can't take that because she's allergic so she's not going to. Wanted to make PCP aware.

## 2022-09-13 NOTE — Telephone Encounter (Signed)
It is her risk to take. I do not have ASA as an allergy.  She has NSAIDS as a general allergy due to renal function but low dose ASA should not be an issue

## 2022-09-14 ENCOUNTER — Other Ambulatory Visit: Payer: Medicare Other

## 2022-09-14 ENCOUNTER — Ambulatory Visit: Payer: Medicare Other | Admitting: Hematology and Oncology

## 2022-09-14 ENCOUNTER — Ambulatory Visit: Payer: Self-pay | Admitting: *Deleted

## 2022-09-14 DIAGNOSIS — C561 Malignant neoplasm of right ovary: Secondary | ICD-10-CM | POA: Diagnosis not present

## 2022-09-14 DIAGNOSIS — D630 Anemia in neoplastic disease: Secondary | ICD-10-CM | POA: Diagnosis not present

## 2022-09-14 DIAGNOSIS — N1831 Chronic kidney disease, stage 3a: Secondary | ICD-10-CM | POA: Diagnosis not present

## 2022-09-14 DIAGNOSIS — Z48815 Encounter for surgical aftercare following surgery on the digestive system: Secondary | ICD-10-CM | POA: Diagnosis not present

## 2022-09-14 DIAGNOSIS — Z432 Encounter for attention to ileostomy: Secondary | ICD-10-CM | POA: Diagnosis not present

## 2022-09-14 DIAGNOSIS — I129 Hypertensive chronic kidney disease with stage 1 through stage 4 chronic kidney disease, or unspecified chronic kidney disease: Secondary | ICD-10-CM | POA: Diagnosis not present

## 2022-09-14 NOTE — Telephone Encounter (Signed)
Again, if it was recommended by one of her specialists.  I suspect they wanted her on it.

## 2022-09-14 NOTE — Patient Instructions (Signed)
Visit Information  Thank you for taking time to visit with me today. Please don't hesitate to contact me if I can be of assistance to you.   Following are the goals we discussed today:   Goals Addressed             This Visit's Progress    [facility] care coordinations   On track    Interventions Today    Flowsheet Row Most Recent Value  Chronic Disease   Chronic disease during today's visit Hypertension (HTN), Other  [home health, telephonic calls, ostomy/ostomy clinic]  General Interventions   General Interventions Discussed/Reviewed General Interventions Discussed, Doctor Visits, Community Resources, Horticulturist, commercial (DME)  Doctor Visits Discussed/Reviewed PCP, Specialist, Doctor Visits Discussed  Durable Medical Equipment (DME) Other  [ostomy supplies]  PCP/Specialist Visits Compliance with follow-up visit  Exercise Interventions   Exercise Discussed/Reviewed Exercise Discussed, Physical Activity  Physical Activity Discussed/Reviewed Physical Activity Discussed, PREP  Frances Furbish assists]  Education Interventions   Education Provided Provided Education  Provided Verbal Education On Nutrition, Programmer, applications, Other  [provided RN Cm & 24 hour nurse contact information]  Mental Health Interventions   Mental Health Discussed/Reviewed Coping Strategies, Mental Health Discussed  Nutrition Interventions   Nutrition Discussed/Reviewed Nutrition Discussed, Fluid intake  Pharmacy Interventions   Pharmacy Dicussed/Reviewed Pharmacy Topics Discussed, Affording Medications  Safety Interventions   Safety Discussed/Reviewed Safety Discussed, Fall Risk              Our next appointment is by telephone on 09/28/22 at 1100  Please call the care guide team at [phone] if you need to cancel or reschedule your appointment.   If you are experiencing a Mental Health or Behavioral Health Crisis or need someone to talk to, please call the Suicide and Crisis Lifeline: 988 call the  [place] National Suicide Prevention Lifeline: 515-306-5623 or TTY: 519-594-5037 TTY (734)040-4423) to talk to a trained counselor call 1-800-273-TALK (toll free, 24 hour hotline) call the [facility]: 820-283-1953 call 911   Patient verbalizes understanding of instructions and care plan provided today and agrees to view in MyChart. Active MyChart status and patient understanding of how to access instructions and care plan via MyChart confirmed with patient.     The patient has been provided with contact information for the care management team and has been advised to call with any health related questions or concerns.   Yailen Zemaitis L. Noelle Penner, RN, BSN, CCM [facility] Care Management Community Coordinator Office number [phone]

## 2022-09-14 NOTE — Patient Outreach (Signed)
  Care Coordination   Initial Visit Note   09/14/2022 Name: April Weber MRN: [id number] DOB: 17-Oct-1945  April Weber is a 77 y.o. year old female who sees April Ip, DO for primary care. I spoke with  April Weber by phone today.  What matters to the patients health and wellness today?  Colostomy doing fair, bag came off. Has extra supplies- hx of colovesical fistula, diverticulitis  Anxious Reports she has so much on her plate, receiving lots of calls  [facility] home health nurse, April Weber, recently visited today- will visit Tuesdays and Fridays [facility] PT comes tomorrow  April Weber visited patient   She informs RN CM she will call when she has questions about her appointments especially the ostomy clinic   Finished chemo was having it every Thursday for her right ovarian cancer History of breast cancer  She has her neighbor & daughter who are supportive- divorced She can get in her car and go through drive through [place]  Denies food insecurity, home improvement needs, financial concerns    Goals Addressed             This Visit's Progress    [facility] care coordinations   On track    Interventions Today    Flowsheet Row Most Recent Value  Chronic Disease   Chronic disease during today's visit Hypertension (HTN), Other  [home health, telephonic calls, ostomy/ostomy clinic]  General Interventions   General Interventions Discussed/Reviewed General Interventions Discussed, Doctor Visits, Community Resources, Horticulturist, commercial (DME)  Doctor Visits Discussed/Reviewed PCP, Specialist, Doctor Visits Discussed  Durable Medical Equipment (DME) Other  [ostomy supplies]  PCP/Specialist Visits Compliance with follow-up visit  Exercise Interventions   Exercise Discussed/Reviewed Exercise Discussed, Physical Activity  Physical Activity Discussed/Reviewed Physical Activity Discussed, PREP  April Weber assists]  Education Interventions   Education Provided Provided  Education  Provided Verbal Education On Nutrition, Programmer, applications, Other  [provided RN Cm & 24 hour nurse contact information]  Mental Health Interventions   Mental Health Discussed/Reviewed Coping Strategies, Mental Health Discussed  Nutrition Interventions   Nutrition Discussed/Reviewed Nutrition Discussed, Fluid intake  Pharmacy Interventions   Pharmacy Dicussed/Reviewed Pharmacy Topics Discussed, Affording Medications  Safety Interventions   Safety Discussed/Reviewed Safety Discussed, Fall Risk              SDOH assessments and interventions completed:  Yes  SDOH Interventions Today    Flowsheet Row Most Recent Value  SDOH Interventions   Food Insecurity Interventions Intervention Not Indicated  Housing Interventions Intervention Not Indicated  Transportation Interventions Intervention Not Indicated  Utilities Interventions Intervention Not Indicated  Financial Strain Interventions Intervention Not Indicated  Stress Interventions Intervention Not Indicated  Social Connections Interventions Intervention Not Indicated        Care Coordination Interventions:  Yes, provided   Follow up plan: Follow up call scheduled for 09/28/22    Encounter Outcome:  Pt. Visit Completed   April Weber L. April Penner, RN, BSN, CCM [facility] Care Management Community Coordinator Office number [phone]

## 2022-09-14 NOTE — Telephone Encounter (Signed)
Pt aware that it is ok to take ASA, she has not had it for 3 mos Is it necessary for her to even restart it again

## 2022-09-15 DIAGNOSIS — D630 Anemia in neoplastic disease: Secondary | ICD-10-CM | POA: Diagnosis not present

## 2022-09-15 DIAGNOSIS — Z48815 Encounter for surgical aftercare following surgery on the digestive system: Secondary | ICD-10-CM | POA: Diagnosis not present

## 2022-09-15 DIAGNOSIS — Z432 Encounter for attention to ileostomy: Secondary | ICD-10-CM | POA: Diagnosis not present

## 2022-09-15 DIAGNOSIS — C561 Malignant neoplasm of right ovary: Secondary | ICD-10-CM | POA: Diagnosis not present

## 2022-09-15 DIAGNOSIS — N1831 Chronic kidney disease, stage 3a: Secondary | ICD-10-CM | POA: Diagnosis not present

## 2022-09-15 DIAGNOSIS — I129 Hypertensive chronic kidney disease with stage 1 through stage 4 chronic kidney disease, or unspecified chronic kidney disease: Secondary | ICD-10-CM | POA: Diagnosis not present

## 2022-09-15 NOTE — Telephone Encounter (Signed)
Spoke with pt she has been notified - no further questions

## 2022-09-16 ENCOUNTER — Telehealth: Payer: Self-pay | Admitting: Family Medicine

## 2022-09-16 NOTE — Telephone Encounter (Signed)
Contacted April Weber to schedule their annual wellness visit. Appointment made for 01/11/2023.  Thank you,  [name],  AMB Clinical Support [facility] AWV Program Direct Dial ??[phone]

## 2022-09-17 DIAGNOSIS — D630 Anemia in neoplastic disease: Secondary | ICD-10-CM | POA: Diagnosis not present

## 2022-09-17 DIAGNOSIS — Z48815 Encounter for surgical aftercare following surgery on the digestive system: Secondary | ICD-10-CM | POA: Diagnosis not present

## 2022-09-17 DIAGNOSIS — C561 Malignant neoplasm of right ovary: Secondary | ICD-10-CM | POA: Diagnosis not present

## 2022-09-17 DIAGNOSIS — I129 Hypertensive chronic kidney disease with stage 1 through stage 4 chronic kidney disease, or unspecified chronic kidney disease: Secondary | ICD-10-CM | POA: Diagnosis not present

## 2022-09-17 DIAGNOSIS — Z432 Encounter for attention to ileostomy: Secondary | ICD-10-CM | POA: Diagnosis not present

## 2022-09-17 DIAGNOSIS — N1831 Chronic kidney disease, stage 3a: Secondary | ICD-10-CM | POA: Diagnosis not present

## 2022-09-20 ENCOUNTER — Telehealth: Payer: Self-pay | Admitting: Oncology

## 2022-09-20 NOTE — Telephone Encounter (Signed)
Called April Weber and she is doing ok since being discharged.  She denies having any pain. She is trying to get her strength back and is getting used to her ostomy.  She is looking forward to hopefully having it reversed in 2-3 months.  Reviewed her upcoming appointments and advised her to call with any questions.

## 2022-09-21 ENCOUNTER — Ambulatory Visit (INDEPENDENT_AMBULATORY_CARE_PROVIDER_SITE_OTHER): Payer: Medicare Other

## 2022-09-21 DIAGNOSIS — I2510 Atherosclerotic heart disease of native coronary artery without angina pectoris: Secondary | ICD-10-CM | POA: Diagnosis not present

## 2022-09-21 DIAGNOSIS — N1831 Chronic kidney disease, stage 3a: Secondary | ICD-10-CM | POA: Diagnosis not present

## 2022-09-21 DIAGNOSIS — D631 Anemia in chronic kidney disease: Secondary | ICD-10-CM

## 2022-09-21 DIAGNOSIS — Z432 Encounter for attention to ileostomy: Secondary | ICD-10-CM | POA: Diagnosis not present

## 2022-09-21 DIAGNOSIS — I700 Atherosclerosis of aorta: Secondary | ICD-10-CM

## 2022-09-21 DIAGNOSIS — I739 Peripheral vascular disease, unspecified: Secondary | ICD-10-CM

## 2022-09-21 DIAGNOSIS — Z48815 Encounter for surgical aftercare following surgery on the digestive system: Secondary | ICD-10-CM | POA: Diagnosis not present

## 2022-09-21 DIAGNOSIS — D630 Anemia in neoplastic disease: Secondary | ICD-10-CM

## 2022-09-21 DIAGNOSIS — C561 Malignant neoplasm of right ovary: Secondary | ICD-10-CM

## 2022-09-21 DIAGNOSIS — I129 Hypertensive chronic kidney disease with stage 1 through stage 4 chronic kidney disease, or unspecified chronic kidney disease: Secondary | ICD-10-CM

## 2022-09-24 ENCOUNTER — Ambulatory Visit (HOSPITAL_COMMUNITY)
Admission: RE | Admit: 2022-09-24 | Discharge: 2022-09-24 | Disposition: A | Discharge status: Home / Self Care | Payer: Medicare Other | Source: Ambulatory Visit | Attending: Nurse Practitioner | Admitting: Nurse Practitioner

## 2022-09-24 DIAGNOSIS — L24B3 Irritant contact dermatitis related to fecal or urinary stoma or fistula: Secondary | ICD-10-CM | POA: Diagnosis not present

## 2022-09-24 DIAGNOSIS — Z932 Ileostomy status: Secondary | ICD-10-CM | POA: Diagnosis not present

## 2022-09-24 DIAGNOSIS — Z432 Encounter for attention to ileostomy: Secondary | ICD-10-CM | POA: Insufficient documentation

## 2022-09-24 NOTE — Progress Notes (Signed)
[facility]   Reason for visit:  RLQ Loop ileostomy HPI:  Sigmoid resection, repair of colovesical fistula Past Medical History:  Diagnosis Date   Arthritis    Breast cancer (HCC) 1980   RIGHT   Chronic kidney disease    has one kidney   DVT (deep venous thrombosis) (HCC)    on RIGHT leg-hx of   Dyspnea    Hyperlipidemia    on meds   Hypertension    on meds   Osteopenia    Peripheral vascular disease (HCC)    PONV (postoperative nausea and vomiting)    Seasonal allergies    Thyroid disease    on meds   Family History  Problem Relation Age of Onset   Heart attack Mother    Alcohol abuse Mother    Lung cancer Mother        d. mid 65s   Breast cancer Maternal Aunt 39   Colon polyps Neg Hx    Colon cancer Neg Hx    Esophageal cancer Neg Hx    Stomach cancer Neg Hx    Rectal cancer Neg Hx    Allergies  Allergen Reactions   Paclitaxel Anaphylaxis   Emend [Fosaprepitant Dimeglumine] Other (See Comments)    3 minutes into emend infusion patient began having facial flushing and redness to bilateral hands and neck, and light headedness   Codeine Nausea And Vomiting   Nsaids Other (See Comments)    Told to avoid due to kidney function   Penicillins Hives   Vibra-Tab [Doxycycline] Hives   Iodine Rash   Latex Rash    Occasionally gets a localized rash on contact with certain latex products   Current Outpatient Medications  Medication Sig Dispense Refill Last Dose   acetaminophen (TYLENOL) 500 MG tablet Take 2 tablets (1,000 mg total) by mouth every 6 (six) hours as needed for mild pain. 30 tablet 0    amLODipine (NORVASC) 5 MG tablet Take 5 mg by mouth daily. (Patient not taking: Reported on 08/27/2022)      aspirin EC 81 MG tablet Take 81 mg by mouth at bedtime. Swallow whole.      atenolol (TENORMIN) 25 MG tablet Take 0.5 tablets (12.5 mg total) by mouth 2 (two) times daily. 90 tablet 3    Calcium Carb-Cholecalciferol (CALCIUM + VITAMIN D3 PO) Take 1  tablet by mouth 2 (two) times daily.      EPINEPHrine 0.3 mg/0.3 mL IJ SOAJ injection Inject 0.3 mg into the muscle as needed for anaphylaxis. 2 each 2    levothyroxine (SYNTHROID) 100 MCG tablet Take 1 tablet (100 mcg total) by mouth in the morning. 90 tablet 3    Multiple Vitamin (MULTIVITAMIN) tablet Take 1 tablet by mouth in the morning.      olaparib (LYNPARZA) 100 MG tablet Take 1 tablet (100 mg total) by mouth 2 (two) times daily. Swallow whole. May take with food to decrease nausea and vomiting. (Patient not taking: Reported on 08/27/2022) 60 tablet 11    ondansetron (ZOFRAN) 4 MG tablet Take 1 tablet (4 mg total) by mouth every 6 (six) hours as needed for nausea. 20 tablet 0    oxyCODONE (OXY IR/ROXICODONE) 5 MG immediate release tablet Take 1 tablet (5 mg total) by mouth every 6 (six) hours as needed for severe pain. 10 tablet 0    simvastatin (ZOCOR) 20 MG tablet Take 20 mg by mouth daily.      No current facility-administered medications for  this encounter.   ROS  Review of Systems  Constitutional:  Positive for fatigue.  Gastrointestinal:        RLQ ileostomy  All other systems reviewed and are negative.  Vital signs:  BP (!) 145/81 (BP Location: Left Arm)   Pulse 79   Temp 97.7 F (36.5 C) (Oral)   Resp 18   SpO2 99%  Exam:  Physical Exam Vitals reviewed.  Constitutional:      Appearance: Normal appearance.  Abdominal:     Palpations: Abdomen is soft.  Skin:    General: Skin is warm and dry.     Findings: Erythema present.  Neurological:     Mental Status: She is alert and oriented to person, place, and time.  Psychiatric:        Mood and Affect: Mood normal.        Behavior: Behavior normal.     Stoma type/location:  oval  3 cm x 2 cm pink and moist budded Stomal assessment/size:  oval Peristomal assessment:  erythema and tenderness Treatment options for stomal/peristomal skin: barrier ring and 1piece pouch   Output: liquid brown stool Ostomy pouching:  1pc. convex  Education provided:  experiencing leaks.  Would like to try a one piece.      Impression/dx  Ileostomy, output has slowed  Discussion  Risk of dehydration Risk of blockage Provided additional written materials on life with an ileostomy Plan  See back one week    Visit time: 50 minutes.   Maple Hudson FNP-BC

## 2022-09-27 ENCOUNTER — Telehealth: Payer: Self-pay | Admitting: Pharmacy Technician

## 2022-09-27 NOTE — Telephone Encounter (Signed)
Oral Oncology Patient Advocate Encounter  Faxed signed hardcopy rx for April Weber to [facility] via e-fax [phone].   Hardcopy fax sent to ensure timely refills of medication for the patient due to program issues with their pharmacy and e-rx.  Jinger Neighbors, CPhT-Adv Oncology Pharmacy Patient Advocate [facility] Direct Number: [phone]  Fax: [phone]

## 2022-09-28 ENCOUNTER — Ambulatory Visit: Payer: Self-pay | Admitting: *Deleted

## 2022-09-28 DIAGNOSIS — L24B3 Irritant contact dermatitis related to fecal or urinary stoma or fistula: Secondary | ICD-10-CM | POA: Insufficient documentation

## 2022-09-28 DIAGNOSIS — I129 Hypertensive chronic kidney disease with stage 1 through stage 4 chronic kidney disease, or unspecified chronic kidney disease: Secondary | ICD-10-CM | POA: Diagnosis not present

## 2022-09-28 DIAGNOSIS — Z432 Encounter for attention to ileostomy: Secondary | ICD-10-CM | POA: Diagnosis not present

## 2022-09-28 DIAGNOSIS — Z48815 Encounter for surgical aftercare following surgery on the digestive system: Secondary | ICD-10-CM | POA: Diagnosis not present

## 2022-09-28 DIAGNOSIS — Z932 Ileostomy status: Secondary | ICD-10-CM | POA: Insufficient documentation

## 2022-09-28 DIAGNOSIS — C561 Malignant neoplasm of right ovary: Secondary | ICD-10-CM | POA: Diagnosis not present

## 2022-09-28 DIAGNOSIS — N1831 Chronic kidney disease, stage 3a: Secondary | ICD-10-CM | POA: Diagnosis not present

## 2022-09-28 DIAGNOSIS — D630 Anemia in neoplastic disease: Secondary | ICD-10-CM | POA: Diagnosis not present

## 2022-09-28 HISTORY — DX: Irritant contact dermatitis related to fecal or urinary stoma or fistula: L24.B3

## 2022-09-28 NOTE — Patient Instructions (Addendum)
Visit Information  Thank you for taking time to visit with me today. Please don't hesitate to contact me if I can be of assistance to you.   09/27/22  8:32 AM Note Oral Oncology Patient Advocate Encounter   Faxed signed hardcopy rx for Lynparza to [facility] via e-fax [phone].   Hardcopy fax sent to ensure timely refills of medication for the patient due to program issues with their pharmacy and e-rx.   Jinger Neighbors, CPhT-Adv Oncology Pharmacy Patient Advocate [facility] Direct Number: [phone]  Fax: [phone]         Following are the goals we discussed today:   Goals Addressed   None     Our next appointment is by telephone on 10/29/22 at 1130  Please call the care guide team at [phone] if you need to cancel or reschedule your appointment.   If you are experiencing a Mental Health or Behavioral Health Crisis or need someone to talk to, please call the Suicide and Crisis Lifeline: 988 call the [place] National Suicide Prevention Lifeline: 825-475-2333 or TTY: 470-618-6049 TTY 724-734-6766) to talk to a trained counselor call 1-800-273-TALK (toll free, 24 hour hotline) call the [facility]: (719) 807-5684 call 911   Patient verbalizes understanding of instructions and care plan provided today and agrees to view in MyChart. Active MyChart status and patient understanding of how to access instructions and care plan via MyChart confirmed with patient.     The patient has been provided with contact information for the care management team and has been advised to call with any health related questions or concerns.   Candance Bohlman L. Noelle Penner, RN, BSN, CCM [facility] Care Management Community Coordinator Office number [phone]

## 2022-09-28 NOTE — Patient Outreach (Unsigned)
  Care Coordination   Follow Up Visit Note   09/29/2022 Name: MILISSA SWINK MRN: [id number] DOB: 10-Jul-1945  LEI MICHELA is a 77 y.o. year old female who sees Raliegh Ip, DO for primary care. I spoke with  [name] by phone today.  What matters to the patients health and wellness today?  Colostomy- home health nurse visited today and she has been to ostomy clinic She feels more comfortable with her colostomy Output from her anus varies from clear to colored mucus Excited about reversal of her colostomy   Now getting her energy back, using a cane she borrowed from a neighbor Trying to manage intake of foods to decrease flatus/odor She prefers mashed potatoes and chicken   Patient is noticing more tremors in her hands Wonders if this is related to the chemo treatment    Inquired about her medication assistance for Garey Ham She confirms understanding that Jinger Neighbors, oncology pharmacy patient advocate has initiated the assist on 09/27/22  She wrote down C Medlin's contact information    Goals Addressed             This Visit's Progress    [facility] care coordination services   On track    Interventions Today    Flowsheet Row Most Recent Value  Chronic Disease   Chronic disease during today's visit Other  [ostomy]  General Interventions   General Interventions Discussed/Reviewed Communication with  Doctor Visits Discussed/Reviewed Doctor Visits Reviewed  PCP/Specialist Visits Compliance with follow-up visit  Communication with PCP/Specialists  [RN CM notes sent to pcp, oncologist, surgeon]  Education Interventions   Education Provided Provided Education  [discussed her intake of foods related to her ostomy, Discussed her pharmacy advocate for Lynparza]  Provided Verbal Education On Nutrition, Programmer, applications, Medication  Mental Health Interventions   Mental Health Discussed/Reviewed Mental Health Reviewed, Coping Strategies  Nutrition Interventions    Nutrition Discussed/Reviewed Nutrition Reviewed, Portion sizes, Decreasing fats, Supplmental nutrition  Pharmacy Interventions   Pharmacy Dicussed/Reviewed Pharmacy Topics Reviewed, Affording Medications              SDOH assessments and interventions completed:  No     Care Coordination Interventions:  Yes, provided   Follow up plan: Follow up call scheduled for 10/29/22    Encounter Outcome:  Pt. Visit Completed   Christofer Shen L. Noelle Penner, RN, BSN, CCM [facility] Care Management Community Coordinator Office number [phone]

## 2022-09-29 ENCOUNTER — Encounter (HOSPITAL_COMMUNITY): Payer: Self-pay | Admitting: General Surgery

## 2022-09-30 ENCOUNTER — Encounter: Payer: Self-pay | Admitting: Hematology and Oncology

## 2022-09-30 ENCOUNTER — Other Ambulatory Visit: Payer: Self-pay

## 2022-09-30 ENCOUNTER — Inpatient Hospital Stay: Payer: Medicare Other | Attending: Gynecologic Oncology | Admitting: Hematology and Oncology

## 2022-09-30 ENCOUNTER — Inpatient Hospital Stay: Payer: Medicare Other

## 2022-09-30 ENCOUNTER — Other Ambulatory Visit (HOSPITAL_COMMUNITY): Payer: Self-pay | Admitting: General Surgery

## 2022-09-30 ENCOUNTER — Telehealth: Payer: Self-pay

## 2022-09-30 VITALS — BP 125/73 | HR 76 | Temp 98.1°F | Resp 18 | Ht 66.0 in | Wt 143.4 lb

## 2022-09-30 DIAGNOSIS — C561 Malignant neoplasm of right ovary: Secondary | ICD-10-CM

## 2022-09-30 DIAGNOSIS — Z79899 Other long term (current) drug therapy: Secondary | ICD-10-CM | POA: Diagnosis not present

## 2022-09-30 DIAGNOSIS — T451X5A Adverse effect of antineoplastic and immunosuppressive drugs, initial encounter: Secondary | ICD-10-CM

## 2022-09-30 DIAGNOSIS — D6481 Anemia due to antineoplastic chemotherapy: Secondary | ICD-10-CM

## 2022-09-30 DIAGNOSIS — K5792 Diverticulitis of intestine, part unspecified, without perforation or abscess without bleeding: Secondary | ICD-10-CM

## 2022-09-30 DIAGNOSIS — L988 Other specified disorders of the skin and subcutaneous tissue: Secondary | ICD-10-CM

## 2022-09-30 DIAGNOSIS — Z95828 Presence of other vascular implants and grafts: Secondary | ICD-10-CM

## 2022-09-30 DIAGNOSIS — N1831 Chronic kidney disease, stage 3a: Secondary | ICD-10-CM | POA: Diagnosis not present

## 2022-09-30 DIAGNOSIS — R634 Abnormal weight loss: Secondary | ICD-10-CM

## 2022-09-30 LAB — CBC WITH DIFFERENTIAL/PLATELET
Abs Immature Granulocytes: 0.1 10*3/uL — ABNORMAL HIGH (ref 0.00–0.07)
Basophils Absolute: 0.1 10*3/uL (ref 0.0–0.1)
Basophils Relative: 1 %
Eosinophils Absolute: 0.7 10*3/uL — ABNORMAL HIGH (ref 0.0–0.5)
Eosinophils Relative: 8 %
HCT: 32.4 % — ABNORMAL LOW (ref 36.0–46.0)
Hemoglobin: 11.2 g/dL — ABNORMAL LOW (ref 12.0–15.0)
Immature Granulocytes: 1 %
Lymphocytes Relative: 11 %
Lymphs Abs: 1 10*3/uL (ref 0.7–4.0)
MCH: 32.3 pg (ref 26.0–34.0)
MCHC: 34.6 g/dL (ref 30.0–36.0)
MCV: 93.4 fL (ref 80.0–100.0)
Monocytes Absolute: 0.9 10*3/uL (ref 0.1–1.0)
Monocytes Relative: 10 %
Neutro Abs: 6 10*3/uL (ref 1.7–7.7)
Neutrophils Relative %: 69 %
Platelets: 244 10*3/uL (ref 150–400)
RBC: 3.47 MIL/uL — ABNORMAL LOW (ref 3.87–5.11)
RDW: 15.8 % — ABNORMAL HIGH (ref 11.5–15.5)
WBC: 8.8 10*3/uL (ref 4.0–10.5)
nRBC: 0 % (ref 0.0–0.2)

## 2022-09-30 LAB — COMPREHENSIVE METABOLIC PANEL
ALT: 12 U/L (ref 0–44)
AST: 15 U/L (ref 15–41)
Albumin: 3.9 g/dL (ref 3.5–5.0)
Alkaline Phosphatase: 62 U/L (ref 38–126)
Anion gap: 7 (ref 5–15)
BUN: 28 mg/dL — ABNORMAL HIGH (ref 8–23)
CO2: 26 mmol/L (ref 22–32)
Calcium: 9.5 mg/dL (ref 8.9–10.3)
Chloride: 100 mmol/L (ref 98–111)
Creatinine, Ser: 1.12 mg/dL — ABNORMAL HIGH (ref 0.44–1.00)
GFR, Estimated: 51 mL/min — ABNORMAL LOW (ref >60)
Glucose, Bld: 97 mg/dL (ref 70–99)
Potassium: 4.6 mmol/L (ref 3.5–5.1)
Sodium: 133 mmol/L — ABNORMAL LOW (ref 135–145)
Total Bilirubin: 0.4 mg/dL (ref 0.3–1.2)
Total Protein: 6.7 g/dL (ref 6.5–8.1)

## 2022-09-30 LAB — SAMPLE TO BLOOD BANK

## 2022-09-30 MED ORDER — HEPARIN SOD (PORK) LOCK FLUSH 100 UNIT/ML IV SOLN
500.0000 [IU] | Freq: Once | INTRAVENOUS | Status: AC
  Administered 2022-09-30: 500 [IU]

## 2022-09-30 MED ORDER — SODIUM CHLORIDE 0.9% FLUSH
10.0000 mL | Freq: Once | INTRAVENOUS | Status: AC
  Administered 2022-09-30: 10 mL

## 2022-09-30 NOTE — Assessment & Plan Note (Signed)
She has appointment to follow-up with general surgery with plan for reversal in July if possible

## 2022-09-30 NOTE — Assessment & Plan Note (Signed)
This is likely anemia of chronic disease. The patient denies recent history of bleeding such as epistaxis, hematuria or hematochezia. She is asymptomatic from the anemia. We will observe for now.  

## 2022-09-30 NOTE — Assessment & Plan Note (Signed)
She has lost a lot of weight since recent hospitalization We discussed importance of frequent small meals and increase dietary protein intake if possible

## 2022-09-30 NOTE — Assessment & Plan Note (Signed)
She is prone to get dehydrated I recommend frequent oral fluid intake

## 2022-09-30 NOTE — Assessment & Plan Note (Signed)
She is recovering well from recent surgery Her blood count is now near normal We discussed the risk and benefits of treatment with olaparib as maintenance and she is in agreement to proceed I plan to see her next month for further follow-up I recommend we start her treatment on June 3 if possible and she agreed

## 2022-09-30 NOTE — Progress Notes (Signed)
[facility] OFFICE PROGRESS NOTE  [name], DO as PCP - General (Family Medicine) [name], MD as Attending Physician (Obstetrics and Gynecology) [name], MD as Consulting Physician (Family Medicine) [name], OD (Optometry) [name], RN as [facility] Care Management Care, [facility]) [name], MD as Consulting Physician (Hematology and Oncology) [name], [name], MD as Consulting Physician (General Surgery)  ASSESSMENT & PLAN:  Right ovarian epithelial cancer [facility]) She is recovering well from recent surgery Her blood count is now near normal We discussed the risk and benefits of treatment with olaparib as maintenance and she is in agreement to proceed I plan to see her next month for further follow-up I recommend we start her treatment on June 3 if possible and she agreed  Anemia due to antineoplastic chemotherapy This is likely anemia of chronic disease. The patient denies recent history of bleeding such as epistaxis, hematuria or hematochezia. She is asymptomatic from the anemia. We will observe for now.    Stage 3a chronic kidney disease (HCC) She is prone to get dehydrated I recommend frequent oral fluid intake  Weight loss She has lost a lot of weight since recent hospitalization We discussed importance of frequent small meals and increase dietary protein intake if possible  Fistula She has appointment to follow-up with general surgery with plan for reversal in July if possible  No orders of the defined types were placed in this encounter.   All questions were answered. The patient knows to call the clinic with any problems, questions or concerns. The total time spent in the appointment was 30 minutes encounter with patients including review of chart and various tests results, discussions about plan of care and coordination of care plan   [name], MD 09/30/2022 10:46 AM  INTERVAL HISTORY: Please see below for problem oriented charting. she returns for treatment follow-up since recent hospitalization She is doing well and managing her colostomy She was seen by surgeon recently with plan for additional testing next month and reversal in July She has lost a lot of weight due to dietary modification but is now going to add more solid food in her diet She denies excessive fatigue No recent blood loss We discussed the risk and benefits of starting her on Lynparza  REVIEW OF SYSTEMS:   Constitutional: Denies fevers, chills Eyes: Denies blurriness of vision Ears, nose, mouth, throat, and face: Denies mucositis or sore throat Respiratory: Denies cough, dyspnea or wheezes Cardiovascular: Denies palpitation, chest discomfort or lower extremity swelling Gastrointestinal:  Denies nausea, heartburn or change in bowel habits Skin: Denies abnormal skin rashes Lymphatics: Denies new lymphadenopathy or easy bruising Neurological:Denies numbness, tingling or new weaknesses Behavioral/Psych: Mood is stable, no new changes  All other systems were reviewed with the patient and are negative.  I have reviewed the past medical history, past surgical history, social history and family history with the patient and they are unchanged from previous note.  ALLERGIES:  is allergic to paclitaxel, emend [fosaprepitant dimeglumine], codeine, nsaids, penicillins, vibra-tab [doxycycline], iodine, and latex.  MEDICATIONS:  Current Outpatient Medications  Medication Sig Dispense Refill   acetaminophen (TYLENOL) 500 MG tablet Take 2 tablets (1,000 mg total) by mouth every 6 (six) hours as needed for mild pain. 30 tablet 0   amLODipine (NORVASC) 5 MG tablet Take 5 mg by mouth daily. (Patient not taking: Reported on 08/27/2022)     aspirin EC 81 MG  tablet Take 81 mg by mouth at bedtime. Swallow whole.     atenolol (TENORMIN) 25 MG tablet Take 0.5 tablets  (12.5 mg total) by mouth 2 (two) times daily. 90 tablet 3   Calcium Carb-Cholecalciferol (CALCIUM + VITAMIN D3 PO) Take 1 tablet by mouth 2 (two) times daily.     EPINEPHrine 0.3 mg/0.3 mL IJ SOAJ injection Inject 0.3 mg into the muscle as needed for anaphylaxis. 2 each 2   levothyroxine (SYNTHROID) 100 MCG tablet Take 1 tablet (100 mcg total) by mouth in the morning. 90 tablet 3   Multiple Vitamin (MULTIVITAMIN) tablet Take 1 tablet by mouth in the morning.     olaparib (LYNPARZA) 100 MG tablet Take 1 tablet (100 mg total) by mouth 2 (two) times daily. Swallow whole. May take with food to decrease nausea and vomiting. (Patient not taking: Reported on 08/27/2022) 60 tablet 11   ondansetron (ZOFRAN) 4 MG tablet Take 1 tablet (4 mg total) by mouth every 6 (six) hours as needed for nausea. 20 tablet 0   oxyCODONE (OXY IR/ROXICODONE) 5 MG immediate release tablet Take 1 tablet (5 mg total) by mouth every 6 (six) hours as needed for severe pain. 10 tablet 0   simvastatin (ZOCOR) 20 MG tablet Take 20 mg by mouth daily.     No current facility-administered medications for this visit.    SUMMARY OF ONCOLOGIC HISTORY: Oncology History Overview Note  High grade papillary serous 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   Right ovarian epithelial cancer (HCC)  03/16/2022 Imaging   1. Large, mixed solid and cystic mass in the low pelvis, difficult to clearly delineate from adjacent ascites although at least 10.2 x 10.0 cm. This is highly concerning for primary ovarian malignancy and most likely arises from the right ovary, although may involve both ovaries. This may be further evaluated by contrast enhanced MRI of the pelvis if desired. 2. Large volume ascites throughout the abdomen and pelvis, presumed malignant. Stranding and nodularity of the omentum and peritoneum, particularly in the ventral abdomen and left upper quadrant, highly suspicious for peritoneal metastatic disease. 3. Cirrhotic  morphology of the liver, which may contribute to ascites. 4. Severely atrophic left kidney, consistent with remote prior infectious, obstructive, or ischemic insult. 5. Coronary artery disease.   Aortic Atherosclerosis (ICD10-I70.0).   03/17/2022 Imaging   1. There is a large solid and cystic mass within the right adnexa which measures up to 9.5 cm in maximum dimension. Findings are concerning for a primary ovarian neoplasm. 2. Large volume of ascites within the abdomen and pelvis. Given the presence of peritoneal nodules on the CT from the prior day findings are concerning for malignant ascites.   03/19/2022 Procedure   CT-guided biopsy of omental mass.    03/19/2022 Procedure   Successful ultrasound-guided paracentesis yielding 4.2 liters of peritoneal fluid.   03/19/2022 Procedure   Successful placement of a left internal jugular approach power injectable Port-A-Cath. The catheter is ready for immediate use.   03/30/2022 Initial Diagnosis   Ovarian cancer (HCC)   03/30/2022 Pathology Results   A. RIGHT OVARY AND FALLOPIAN TUBE, SALPINGO OOPHORECTOMY: Poorly differentiated carcinoma immunohistochemically most consistent with a high grade serous carcinoma Tumor measures 10.7 x 10.6 x 4.6 cm Tumor involves ovarian and mesosalpinx serosal surfaces and right fallopian tube pT2a pN n/a  Note: The tumor consists of sheets and cords of high-grade nuclei with areas of necrosis and adjacent prominent stroma suggesting papillary cores the overall morphology is slightly  unusual and not classic; therefore, a battery of immunohistochemical stains were performed to confirm the diagnosis and exclude a high-grade germ cell tumor.  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 Additionally there is nonspecific focal PLAP positivity; CD117 positivity and D2-40 positivity of uncertain clinical  significance.  The tumor is negative for AFP, hCG, CD30, CD31, CK20, glypican-3, and OCT 3/4.  Dr. Luisa Hart has peer reviewed the case and agrees with diagnosis. Dr. Venetia Night has also initiated the workup of the case and agrees with the interpretation.  B. OMENTUM, RESECTION: Benign omentum Negative for tumor  C. RIGHT PELVIC SIDEWALL, BIOPSY: Benign mesothelium, fibrovascular stroma and adipose Negative for tumor  D. ANTERIOR CUL DE SAC, BIOPSY: Benign mesothelium Negative for tumor  E. LEFT PELVIC SIDEWALL, BIOPSY: Benign mesothelium and adipose Negative for tumor   ONCOLOGY TABLE:  OVARY or FALLOPIAN TUBE or PRIMARY PERITONEUM: Resection  Procedure: Right salpingo-oophorectomy Specimen Integrity: Grossly focally disrupted Tumor Site: Right ovary Tumor Size: 10.7 x 10.6 x 4.6 cm Histologic Type: Poorly differentiated carcinoma most consistent with a high-grade serous carcinoma Histologic Grade: High-grade Ovarian Surface Involvement: Surface involvement Fallopian Tube Surface Involvement: Mesosalpinx serosal surface involvement and right fallopian tube involvement Implants (required for advanced stage serous/seromucinous borderline tumors only): Not identified Other Tissue/ Organ Involvement: N/A Largest Extrapelvic Peritoneal Focus: N/A Peritoneal/Ascitic Fluid Involvement: N/A Chemotherapy Response Score (CRS): N/A, no known presurgical therapy Regional Lymph Nodes: N/A, no lymph nodes submitted Distant Metastasis:      Distant Site(s) Involved: N/A Pathologic Stage Classification (pTNM, AJCC 8th Edition): pT2a, pN N/A Ancillary Studies: Can be performed on physician request Representative Tumor Block: A8 Comment(s): [None]    03/30/2022 Surgery   Preop Diagnosis: Pelvic mass, malignant ascites   Postoperative Diagnosis: at least stage IIB ovarian cancer, suspected high grade serous by frozen section   Surgery: Diagnostic laparoscopy, right  salpingo-oophorectomy, peritoneal biopsies, infra-colic omentectomy    Surgeon:  Carin Hock MD    Operative findings: On exam under anesthesia, somewhat mobile mass appreciated in the cul-de-sac to the right.  On agnostic laparoscopy, normal upper abdominal findings other than moderate volume ascites.  Normal-appearing peritoneum, omentum, small bowel.  Right adnexa enlarged with a mass measuring approximately 8-10 cm.  No carcinomatosis appreciated.  Given this, decision made to convert to an open procedure for tumor debulking.  Approximately 5 L of yellow-tinged ascites was removed upon intra-abdominal entry.  Normal upper abdominal survey including smooth diaphragm and liver, stomach, and omentum.  The small bowel was run from the cecum to the ligament of Treitz with no abnormalities or carcinomatosis noted.  Right ovary replaced by an approximately 8 cm smooth mass, dilated and enlarged fallopian tube.  The mass itself was adherent to the right pelvic sidewall, sigmoid colon, and densely adherent to the anterior cul-de-sac/bladder peritoneum.  Frozen section consistent with invasive carcinoma, likely high-grade.  Left tube and ovary were surgically absent.  Sigmoid colon with adhesions to the left pelvic sidewall, left aspect of the bladder and the left vaginal cuff.  Somewhat redundant sigmoid which looped over on itself secondary to these adhesions.  Numerous diverticula noted of the sigmoid colon as well as the transverse colon.  The end of surgery, an R0 resection was accomplished.     04/13/2022 Cancer Staging   Staging form: Ovary, Fallopian Tube, and Primary Peritoneal Carcinoma, AJCC 8th Edition - Pathologic stage from 04/13/2022: Stage  II (pT2, pN0, cM0) - Signed by [name], MD on 04/13/2022 Stage prefix: Initial diagnosis   04/26/2022 - 08/10/2022 Chemotherapy   Patient is on Treatment Plan : OVARIAN Carboplatin (AUC 6) + Paclitaxel (175) q21d X 6 Cycles     05/17/2022 Tumor Marker    Patient's tumor was tested for the following markers: CA-125. Results of the tumor marker test revealed 12.7.   06/06/2022 Genetic Testing   Germline: Negative [facility] Multi-Cancer +RNA Panel.  Report date is 06/06/2022.   The Multi-Cancer + RNA Panel offered by [facility] includes sequencing and/or deletion/duplication analysis of the following 70 genes:  AIP*, ALK, APC*, ATM*, AXIN2*, BAP1*, BARD1*, BLM*, BMPR1A*, BRCA1*, BRCA2*, BRIP1*, CDC73*, CDH1*, CDK4, CDKN1B*, CDKN2A, CHEK2*, CTNNA1*, DICER1*, EPCAM (del/dup only), EGFR, FH*, FLCN*, GREM1 (promoter dup only), HOXB13, KIT, LZTR1, MAX*, MBD4, MEN1*, MET, MITF, MLH1*, MSH2*, MSH3*, MSH6*, MUTYH*, NF1*, NF2*, NTHL1*, PALB2*, PDGFRA, PMS2*, POLD1*, POLE*, POT1*, PRKAR1A*, PTCH1*, PTEN*, RAD51C*, RAD51D*, RB1*, RET, SDHA* (sequencing only), SDHAF2*, SDHB*, SDHC*, SDHD*, SMAD4*, SMARCA4*, SMARCB1*, SMARCE1*, STK11*, SUFU*, TMEM127*, TP53*, TSC1*, TSC2*, VHL*. RNA analysis is performed for * genes.  Somatic: Positive genomic instability score (GIS) of 62 through [facility].  No pathogenic variants detected in BRCA1/2.  Report date is June 10, 2022.   [facility] CDx includes sequencing and large rearrangement analysis of BRCA1/2 and genomic instability status through loss of heterozygosity (LOH), telomeric allelic imbalance (TAI) and large-scale state transitions (LST).     06/08/2022 Tumor Marker   Patient's tumor was tested for the following markers: Ca-125. Results of the tumor marker test revealed 16.5.   06/13/2022 - 06/17/2022 Hospital Admission   The patient was admitted to the hospital and found to have diarrhea, diverticulitis and pancytopenia   06/13/2022 Imaging   Colonic diverticulosis. Inflammatory stranding around the mid sigmoid colon compatible with active diverticulitis.   Interval resection of the previously seen lower pelvic solid and cystic mass and resolution of ascites.   Aortic atherosclerosis.   Severely atrophic  left kidney, stable since prior study.   06/30/2022 Tumor Marker   Patient's tumor was tested for the following markers: CA-125. Results of the tumor marker test revealed 15.   07/21/2022 Tumor Marker   Patient's tumor was tested for the following markers: CA-125. Results of the tumor marker test revealed 11.4.   08/12/2022 Tumor Marker   Patient's tumor was tested for the following markers: CA-125. Results of the tumor marker test revealed 9.7.   08/14/2022 Imaging   1. Wall thickening of the sigmoid colon with adjacent inflammatory change, overall improved when compared with prior exam. New focal collection of gas involving the wall of the sigmoid colon which extends inferiorly to the area of the left vaginal cuff with contrast material seen within the vaginal cuff. 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 2. Wall thickening of the left lateral wall of the urinary bladder with air seen within the urinary bladder, findings raise concern for additional area of fistulization to the bladder. CT cystogram could be performed for better evaluation. 3. New pelviectasis and mild dilation of the left ureter, likely secondary to inflammatory change at the area of the left ureterovesicular junction. Chronic atrophy of the left kidney. 4. Increased wall thickening of a large colonic diverticulum of the proximal transverse colon, likely due to diverticulitis. 5. Aortic Atherosclerosis (ICD10-I70.0).     08/23/2022 Imaging   1. Increased large volume gas within the bladder. This raises concern for colovesical fistula. Consider CT cystogram for further  evaluation. 2. Similar to slightly decreased inflammatory changes surrounding the sigmoid colon. The previous collection of gas involving the wall of the sigmoid colon has decreased. There is a small amount of residual gas in the area of the left vaginal cuff. There is again a soft tissue tract extending from the vaginal cuff to the sigmoid colon,  though no gas is seen within this tract. 3. Decreased size and wall thickening about the large diverticulum in the proximal transverse colon. 4. Slight decrease in pelviectasis and dilation of the left ureter.   Aortic Atherosclerosis (ICD10-I70.0).     08/27/2022 Imaging   1. Continued sigmoid wall thickening and inflammatory reaction with diverticulosis with increased inflammation and layering reactive pelvic fluid since April 15. Further evaluation to exclude underlying colonic lesion is recommended when clinically feasible. 2. There is now a demonstrable fistulous tract from the sigmoid colon into the left dorsal bladder wall, with air and fluid in the bladder and patchy enteric contrast now settling in the dependent bladder. 3. The known left-sided colovaginal fistula is not well seen today but there is contrast in the vagina consistent with continued tract patency. 4. There is now moderate bilateral hydroureteronephrosis into the pelvis where the ureters are difficult to follow due to the pre-existing inflammatory process, with ureteral tethering by inflammatory process most likely causing the obstructive uropathy. No stone disease is seen. 5. Severe chronic atrophy of the left kidney. 6. Aortic and coronary artery atherosclerosis. 7. Anemia. 8. Prominent common bile duct at 12 mm, unchanged. 9. Constipation. 10. Osteopenia, scoliosis, and advanced degenerative change of the lumbar spine with acquired spinal stenosis L4-5.   09/01/2022 Surgery   Preoperative diagnosis: colovesical fistula   Postoperative diagnosis: same    Procedure: laparoscopic converted to open sigmoid resection with anastomosis, diverting loop ileostomy, take down of colovesical fistula   Surgeon: Feliciana Rossetti, M.D.   Asst: Carl Best, [facility]   Anesthesia: GETA   Indications for procedure: TWINKLE OPPERMANN is a 77 y.o. year old female with symptoms of recurrent UTIs and abdominal pain.   Description of  procedure: The patient was brought into the operative suite. Anesthesia was administered with General endotracheal anesthesia. WHO checklist was applied. The patient was then placed in lithotomy position. The area was prepped and draped in the usual sterile fashion.   Dr. Laverle Patter performed a cysto and plans ureteral stents prior to surgery.   Next, a left subcostal incision was made. A 5mm trocar was used to gain access to the peritoneal cavity by optical entry technique. Pneumoperitoneum was applied with a high flow and low pressure. The laparoscope was reinserted to confirm position.   3 additional trocars were placed 1 5 mm trocar in the right upper abdomen, 1 5 mm trocar in the infraumbilical space, and 1 12 mm trocar in the right lower quadrant.   There were no adhesions to the abdominal wall. The sigmoid colon was thickened and chronically inflamed. There was one loop of small intestine adhesed to the sigmoid colon, this was sharply dissected free. The White line of Toldt was incised. Blunt dissection was used to free up the colon from the bladder and abdominal wall. This was very difficult. During this dissection Dr. Melynda Keller came in to look at the abdomen and determined no evidence of cancer was present. After about 30 minutes of laparoscopic dissection, I did not think I could safely complete dissection laparoscopically and so a lower midline incision was made.   Next, palpation identified the  ureters which were well away from the dissection. Blunt dissection was able to free the colon completely from the bladder and side wall. Next, a blue load contour stapler was used to divide the distal sigmoid colon. The mesentery was taken with ligasure device. The left colon was fully mobilized a portion of colon proximal to the inflammatory changes was purse stringed and divided distally. A 29 mm anvil was placed into the colon and purse string tied down.    Next, Dr. Laverle Patter came in to repair the bladder.  See his dictation for more information.   The rectum was mobilized more by freeing it from the side walls. At this point I went below to palpate the rectum, there was some difficulty to get the dilator to reach the staple line. There was an attempt to pass the stapler but this led to a tear in the distal rectum. Dr. Michaell Cowing came in at this time to assist and a further margin of sigmoid/rectum was taken with the contour stapler. This allowed the stapler to come to the end and anastomosis was performed. Leak test showed a small leak distal to the staple line that was repaired with interrupted 3-0 vicryl. Due to this leak and thin tissue at the area, decision was made to divert with loop ileostomy.   The 12 mm trocar site was enlarged and a portion of distal ileum brought through the hole. A 19 fr blake drain was brought through RUQ incision and tip placed into the pelvis. The abdomen was irrigated. Hemostasis was intact. The peritoneum was closed with 2-0 vicryl. The fascia was closed with 0 PDS. The ileostomy was matured with 3-0 and 2-3 cm of Brooking. The skin was closed with staples. Ileostomy bag was placed. All counts were correct.   Findings: colovesical fistula, dense inflammation of the sigmoid colon   09/01/2022 Surgery   Preoperative diagnosis: 1.  Colovesical fistula 2.  Atrophic left kidney 3.  Right hydronephrosis   Postoperative diagnosis: 1.  Colovesical fistula 2.  Atrophic left kidney 3.  Right hydronephrosis   Procedures: 1.  Cystoscopy 2.  Bilateral retrograde pyelography with interpretation 3.  Bilateral ureteral stent placement (right -6 x 24, left -6 x 26) 4.  Repair of bladder fistula     PHYSICAL EXAMINATION: ECOG PERFORMANCE STATUS: 1 - Symptomatic but completely ambulatory  Vitals:   09/30/22 1025  BP: 125/73  Pulse: 76  Resp: 18  Temp: 98.1 F (36.7 C)  SpO2: 100%   Filed Weights   09/30/22 1025  Weight: 143 lb 6.4 oz (65 kg)    NEURO: alert &  oriented x 3 with fluent speech, no focal motor/sensory deficits  LABORATORY DATA:  I have reviewed the data as listed    Component Value Date/Time   NA 133 (L) 09/30/2022 1000   NA 134 06/01/2022 0848   K 4.6 09/30/2022 1000   CL 100 09/30/2022 1000   CO2 26 09/30/2022 1000   GLUCOSE 97 09/30/2022 1000   BUN 28 (H) 09/30/2022 1000   BUN 7 (L) 06/01/2022 0848   CREATININE 1.12 (H) 09/30/2022 1000   CREATININE 0.93 08/10/2022 0857   CREATININE 0.99 02/05/2013 0757   CALCIUM 9.5 09/30/2022 1000   PROT 6.7 09/30/2022 1000   PROT 6.0 06/01/2022 0848   ALBUMIN 3.9 09/30/2022 1000   ALBUMIN 3.6 (L) 06/01/2022 0848   AST 15 09/30/2022 1000   AST 12 (L) 08/10/2022 0857   ALT 12 09/30/2022 1000   ALT 11 08/10/2022  0857   ALKPHOS 62 09/30/2022 1000   BILITOT 0.4 09/30/2022 1000   BILITOT 0.3 08/10/2022 0857   GFRNONAA 51 (L) 09/30/2022 1000   GFRNONAA >60 08/10/2022 0857   GFRNONAA 59 (L) 02/05/2013 0757   GFRAA 61 05/20/2020 0850   GFRAA 68 02/05/2013 0757    No results found for: "SPEP", "UPEP"  Lab Results  Component Value Date   WBC 8.8 09/30/2022   NEUTROABS 6.0 09/30/2022   HGB 11.2 (L) 09/30/2022   HCT 32.4 (L) 09/30/2022   MCV 93.4 09/30/2022   PLT 244 09/30/2022      Chemistry      Component Value Date/Time   NA 133 (L) 09/30/2022 1000   NA 134 06/01/2022 0848   K 4.6 09/30/2022 1000   CL 100 09/30/2022 1000   CO2 26 09/30/2022 1000   BUN 28 (H) 09/30/2022 1000   BUN 7 (L) 06/01/2022 0848   CREATININE 1.12 (H) 09/30/2022 1000   CREATININE 0.93 08/10/2022 0857   CREATININE 0.99 02/05/2013 0757      Component Value Date/Time   CALCIUM 9.5 09/30/2022 1000   ALKPHOS 62 09/30/2022 1000   AST 15 09/30/2022 1000   AST 12 (L) 08/10/2022 0857   ALT 12 09/30/2022 1000   ALT 11 08/10/2022 0857   BILITOT 0.4 09/30/2022 1000   BILITOT 0.3 08/10/2022 0857       RADIOGRAPHIC STUDIES: I have personally reviewed the radiological images as listed and agreed  with the findings in the report. DG Cystogram  Result Date: 09/06/2022 CLINICAL DATA:  Status post repair of colovesical fistula. EXAM: CYSTOGRAM TECHNIQUE: Bladder was filled with 250 mL Cysto-Hypaque 30% by drip infusion through the existing Foley catheter. Serial spot images were obtained during bladder filling and post draining. FLUOROSCOPY: Radiation Exposure Index (as provided by the fluoroscopic device): 26.1 mGy Kerma COMPARISON:  09/01/2022 and CT 08/27/2018 for FINDINGS: Scout image demonstrates bilateral ureter stents and a surgical drain in the pelvis. Images were obtained as the bladder was filled with contrast. There is no evidence for a bladder leak. Contrast refluxed into both ureter stents and filled the renal pelvises bilaterally. No evidence for a colonic fistula. There is no contrast filling the surgical drain. Postvoid image demonstrates an empty urinary bladder with residual contrast in the renal pelvises. Again noted are dilated renal pelvises, left side greater than right. IMPRESSION: 1. No evidence for a bladder leak or colonic fistula. 2. Patent bilateral ureter stents with bilateral ureterovesical reflux. Electronically Signed   By: Richarda Overlie M.D.   On: 09/06/2022 09:45   DG C-Arm 1-60 Min-No Report  Result Date: 09/01/2022 Fluoroscopy was utilized by the requesting physician.  No radiographic interpretation.

## 2022-09-30 NOTE — Telephone Encounter (Signed)
Late Entry:  Pt called 09/29/22 to state she will need an FMLA extension for her daughter who is a PA in [place]. She states she is still needing a considerable amount of assistance from her daughter and it has been difficult for her daughter to be available unless using FMLA. She was provided with our fax number for her daughter's HR to send over forms for [place] to provide to the [facility] nurses.

## 2022-10-01 LAB — CA 125: Cancer Antigen (CA) 125: 14 U/mL (ref 0.0–38.1)

## 2022-10-05 ENCOUNTER — Telehealth: Payer: Self-pay

## 2022-10-05 DIAGNOSIS — Z48815 Encounter for surgical aftercare following surgery on the digestive system: Secondary | ICD-10-CM | POA: Diagnosis not present

## 2022-10-05 DIAGNOSIS — Z432 Encounter for attention to ileostomy: Secondary | ICD-10-CM | POA: Diagnosis not present

## 2022-10-05 DIAGNOSIS — C561 Malignant neoplasm of right ovary: Secondary | ICD-10-CM | POA: Diagnosis not present

## 2022-10-05 DIAGNOSIS — N1831 Chronic kidney disease, stage 3a: Secondary | ICD-10-CM | POA: Diagnosis not present

## 2022-10-05 DIAGNOSIS — I129 Hypertensive chronic kidney disease with stage 1 through stage 4 chronic kidney disease, or unspecified chronic kidney disease: Secondary | ICD-10-CM | POA: Diagnosis not present

## 2022-10-05 DIAGNOSIS — D630 Anemia in neoplastic disease: Secondary | ICD-10-CM | POA: Diagnosis not present

## 2022-10-05 NOTE — Telephone Encounter (Cosign Needed)
Oral Chemotherapy Pharmacist Encounter  I spoke with patient today for overview of: April Weber for the adjuvant treatment of ovarian cancer with response to platinum-based chemotherapy, planned duration until disease progression or unacceptable toxicity.   Labs from 09/30/2022 assessed, while her renal function would warrant a dose reduction, Dr. Bertis Ruddy further reduced the dose of olaparib due to a history of severe pancytopenia. Prescription dose and frequency assessed.   Current medication list in Epic reviewed, there are no DDIs with olaparib identified.  Evaluated chart and there are no patient barriers to medication adherence identified.   Counseled patient on administration, dosing, side effects, monitoring, drug-food interactions, safe handling, storage, and disposal.  Patient will take Lynparza 100mg  tablets, 1 tablet (100mg ) by mouth 2 times daily without regard to food.  Patient counseled that administering with food may increase tolerability.  Patient knows to avoid grapefruit or grapefruit juice while on therapy with Lynparza.  April Weber start date: 10/11/2022   Adverse effects include but are not limited to: nausea, vomiting, diarrhea, fatigue, decreased blood counts, and joint pain. Patient informed that pneumonitis (including some fatalities) has occurred rarely. Myelodysplastic syndrome/acute myeloid leukemia (MDS/AML) have been reported (rarely) in clinical trials.  Patient has anti-emetic on hand and knows to take it if nausea develops.   Patient will obtain anti diarrheal and alert the office of 4 or more loose stools above baseline.  Reviewed with patient importance of keeping a medication schedule and plan for any missed doses. No barriers to medication adherence identified.  Medication reconciliation performed and medication/allergy list updated.  All questions answered.  April Weber voiced understanding and appreciation.   Medication education handout placed in mail  for patient. Patient knows to call the office with questions or concerns. Oral Chemotherapy Clinic phone number provided to patient.   April Weber, PharmD PGY-2 Pharmacy Resident Hematology/Oncology [phone]  10/05/2022 4:26 PM

## 2022-10-07 ENCOUNTER — Ambulatory Visit (HOSPITAL_COMMUNITY)
Admission: RE | Admit: 2022-10-07 | Discharge: 2022-10-07 | Disposition: A | Discharge status: Home / Self Care | Payer: Medicare Other | Source: Ambulatory Visit | Attending: General Surgery | Admitting: General Surgery

## 2022-10-07 DIAGNOSIS — K5792 Diverticulitis of intestine, part unspecified, without perforation or abscess without bleeding: Secondary | ICD-10-CM | POA: Insufficient documentation

## 2022-10-07 DIAGNOSIS — K5730 Diverticulosis of large intestine without perforation or abscess without bleeding: Secondary | ICD-10-CM | POA: Diagnosis not present

## 2022-10-07 MED ORDER — IOHEXOL 300 MG/ML  SOLN
100.0000 mL | Freq: Once | INTRAMUSCULAR | Status: AC | PRN
  Administered 2022-10-07: 100 mL

## 2022-10-08 NOTE — Transitions of Care (Post Inpatient/ED Visit) (Signed)
09/10/2022  Name: April Weber MRN: [id number] DOB: Dec 14, 1945  Today's TOC FU Call Status: TOC FU Call Complete Date: 09/10/22  Transition Care Management Follow-up Telephone Call Date of Discharge: 09/08/22 Discharge Facility: [facility]) Type of Discharge: Inpatient Admission Primary Inpatient Discharge Diagnosis:: Colovesical fistula How have you been since you were released from the hospital?: Better Any questions or concerns?: No  Items Reviewed: Did you receive and understand the discharge instructions provided?: Yes Medications obtained,verified, and reconciled?: Yes (Medications Reviewed) Any new allergies since your discharge?: No Dietary orders reviewed?: NA Do you have support at home?: Yes  Medications Reviewed Today: Medications Reviewed Today     Reviewed by [name], MD (Physician) on 09/30/22 at 1043  Med List Status: <None>   Medication Order Taking? Sig Documenting Provider Last Dose Status Informant  acetaminophen (TYLENOL) 500 MG tablet [id number]  Take 2 tablets (1,000 mg total) by mouth every 6 (six) hours as needed for mild pain. Tyrone Nine, MD  Active   amLODipine (NORVASC) 5 MG tablet [id number] No Take 5 mg by mouth daily.  Patient not taking: Reported on 08/27/2022   [provider] Not Taking Active Self, Pharmacy Records  aspirin EC 81 MG tablet [id number] No Take 81 mg by mouth at bedtime. Swallow whole. [provider] 08/26/2022 Active Self, Pharmacy Records  atenolol (TENORMIN) 25 MG tablet [id number] No Take 0.5 tablets (12.5 mg total) by mouth 2 (two) times daily. Raliegh Ip, DO 08/27/2022 0600 Active Self, Pharmacy Records           Med Note (CRUTHIS, CHLOE C   Fri Aug 27, 2022 12:06 PM) LF 12/23 for 90 DS. Pt is adamant she is still taking this medication daily. Dispense report does not support this.   Calcium Carb-Cholecalciferol (CALCIUM + VITAMIN D3 PO) [id number] No Take 1 tablet by mouth 2 (two) times  daily. [provider] 08/27/2022 Active Self, Pharmacy Records  EPINEPHrine 0.3 mg/0.3 mL IJ SOAJ injection [id number] No Inject 0.3 mg into the muscle as needed for anaphylaxis. Bennie Pierini, FNP never Active Self, Pharmacy Records  levothyroxine (SYNTHROID) 100 MCG tablet [id number] No Take 1 tablet (100 mcg total) by mouth in the morning. Delynn Flavin M, DO 08/27/2022 Active Self, Pharmacy Records  Multiple Vitamin (MULTIVITAMIN) tablet [id number] No Take 1 tablet by mouth in the morning. [provider] 08/27/2022 Active Self, Pharmacy Records  olaparib [facility]) 100 MG tablet [id number] No Take 1 tablet (100 mg total) by mouth 2 (two) times daily. Swallow whole. May take with food to decrease nausea and vomiting.  Patient not taking: Reported on 08/27/2022   [name], MD Not Taking Active Self, Pharmacy Records  ondansetron [facility]) 4 MG tablet [id number]  Take 1 tablet (4 mg total) by mouth every 6 (six) hours as needed for nausea. Tyrone Nine, MD  Active   oxyCODONE (OXY IR/ROXICODONE) 5 MG immediate release tablet [id number]  Take 1 tablet (5 mg total) by mouth every 6 (six) hours as needed for severe pain. Tyrone Nine, MD  Active   simvastatin (ZOCOR) 20 MG tablet [id number] No Take 20 mg by mouth daily. [provider] 08/27/2022 Active Self, Pharmacy Records            Home Care and Equipment/Supplies: Were Home Health Services Ordered?: Yes [facility] of [facility] Agency[facility] Has Agency set up a time to come to your home?: Yes First Home Health Visit Date: 09/11/22 Any new equipment or  medical supplies ordered?: No  Functional Questionnaire: Do you need assistance with bathing/showering or dressing?: No Do you need assistance with meal preparation?: No Do you need assistance with eating?: No Do you have difficulty maintaining continence: No Do you need assistance with getting out of bed/getting out of a chair/moving?: No Do you have  difficulty managing or taking your medications?: No  Follow up appointments reviewed: PCP Follow-up appointment confirmed?: No (patient declined) MD Provider Line Number:[phone] Given: No Specialist Hospital Follow-up appointment confirmed?: Yes Date of Specialist follow-up appointment?: 09/12/20 Follow-Up Specialty Provider:: [facility] Do you need transportation to your follow-up appointment?: No Do you understand care options if your condition(s) worsen?: Yes-patient verbalized understanding   Demetrios Loll, BSN, RN-BC RN Care Coordinator [facility]  [facility] Direct Dial: [phone] Main #: [phone]

## 2022-10-19 DIAGNOSIS — R8271 Bacteriuria: Secondary | ICD-10-CM | POA: Diagnosis not present

## 2022-10-19 DIAGNOSIS — N321 Vesicointestinal fistula: Secondary | ICD-10-CM | POA: Diagnosis not present

## 2022-10-21 ENCOUNTER — Other Ambulatory Visit: Payer: Self-pay

## 2022-10-21 ENCOUNTER — Inpatient Hospital Stay: Payer: Medicare Other

## 2022-10-21 ENCOUNTER — Encounter: Payer: Self-pay | Admitting: Hematology and Oncology

## 2022-10-21 ENCOUNTER — Inpatient Hospital Stay: Payer: Medicare Other | Attending: Gynecologic Oncology | Admitting: Hematology and Oncology

## 2022-10-21 ENCOUNTER — Telehealth: Payer: Self-pay

## 2022-10-21 VITALS — BP 138/67 | HR 70 | Temp 98.1°F | Resp 18 | Ht 66.0 in | Wt 144.5 lb

## 2022-10-21 DIAGNOSIS — C561 Malignant neoplasm of right ovary: Secondary | ICD-10-CM | POA: Diagnosis not present

## 2022-10-21 DIAGNOSIS — R180 Malignant ascites: Secondary | ICD-10-CM | POA: Insufficient documentation

## 2022-10-21 DIAGNOSIS — T451X5A Adverse effect of antineoplastic and immunosuppressive drugs, initial encounter: Secondary | ICD-10-CM | POA: Diagnosis not present

## 2022-10-21 DIAGNOSIS — D6481 Anemia due to antineoplastic chemotherapy: Secondary | ICD-10-CM | POA: Diagnosis not present

## 2022-10-21 DIAGNOSIS — T451X5D Adverse effect of antineoplastic and immunosuppressive drugs, subsequent encounter: Secondary | ICD-10-CM | POA: Diagnosis not present

## 2022-10-21 DIAGNOSIS — Z7982 Long term (current) use of aspirin: Secondary | ICD-10-CM | POA: Diagnosis not present

## 2022-10-21 DIAGNOSIS — L988 Other specified disorders of the skin and subcutaneous tissue: Secondary | ICD-10-CM | POA: Diagnosis not present

## 2022-10-21 DIAGNOSIS — Z79899 Other long term (current) drug therapy: Secondary | ICD-10-CM | POA: Insufficient documentation

## 2022-10-21 DIAGNOSIS — M8588 Other specified disorders of bone density and structure, other site: Secondary | ICD-10-CM | POA: Diagnosis not present

## 2022-10-21 DIAGNOSIS — Z90721 Acquired absence of ovaries, unilateral: Secondary | ICD-10-CM | POA: Insufficient documentation

## 2022-10-21 DIAGNOSIS — N1831 Chronic kidney disease, stage 3a: Secondary | ICD-10-CM | POA: Insufficient documentation

## 2022-10-21 LAB — CBC WITH DIFFERENTIAL/PLATELET
Abs Immature Granulocytes: 0.04 10*3/uL (ref 0.00–0.07)
Basophils Absolute: 0.1 10*3/uL (ref 0.0–0.1)
Basophils Relative: 1 %
Eosinophils Absolute: 0.6 10*3/uL — ABNORMAL HIGH (ref 0.0–0.5)
Eosinophils Relative: 7 %
HCT: 31.8 % — ABNORMAL LOW (ref 36.0–46.0)
Hemoglobin: 10.9 g/dL — ABNORMAL LOW (ref 12.0–15.0)
Immature Granulocytes: 1 %
Lymphocytes Relative: 11 %
Lymphs Abs: 0.8 10*3/uL (ref 0.7–4.0)
MCH: 31.9 pg (ref 26.0–34.0)
MCHC: 34.3 g/dL (ref 30.0–36.0)
MCV: 93 fL (ref 80.0–100.0)
Monocytes Absolute: 0.6 10*3/uL (ref 0.1–1.0)
Monocytes Relative: 7 %
Neutro Abs: 5.7 10*3/uL (ref 1.7–7.7)
Neutrophils Relative %: 73 %
Platelets: 213 10*3/uL (ref 150–400)
RBC: 3.42 MIL/uL — ABNORMAL LOW (ref 3.87–5.11)
RDW: 14.6 % (ref 11.5–15.5)
WBC: 7.7 10*3/uL (ref 4.0–10.5)
nRBC: 0 % (ref 0.0–0.2)

## 2022-10-21 LAB — COMPREHENSIVE METABOLIC PANEL
ALT: 11 U/L (ref 0–44)
AST: 14 U/L — ABNORMAL LOW (ref 15–41)
Albumin: 3.6 g/dL (ref 3.5–5.0)
Alkaline Phosphatase: 53 U/L (ref 38–126)
Anion gap: 7 (ref 5–15)
BUN: 24 mg/dL — ABNORMAL HIGH (ref 8–23)
CO2: 25 mmol/L (ref 22–32)
Calcium: 9.5 mg/dL (ref 8.9–10.3)
Chloride: 102 mmol/L (ref 98–111)
Creatinine, Ser: 1.26 mg/dL — ABNORMAL HIGH (ref 0.44–1.00)
GFR, Estimated: 44 mL/min — ABNORMAL LOW (ref >60)
Glucose, Bld: 90 mg/dL (ref 70–99)
Potassium: 4.3 mmol/L (ref 3.5–5.1)
Sodium: 134 mmol/L — ABNORMAL LOW (ref 135–145)
Total Bilirubin: 0.4 mg/dL (ref 0.3–1.2)
Total Protein: 6.6 g/dL (ref 6.5–8.1)

## 2022-10-21 NOTE — Progress Notes (Signed)
[facility] OFFICE PROGRESS NOTE  [name], DO as PCP - General (Family Medicine) [name], MD as Attending Physician (Obstetrics and Gynecology) [name], MD as Consulting Physician (Family Medicine) [name], OD (Optometry) [name], RN as [facility] Management Care, [facility]) [name], MD as Consulting Physician (Hematology and Oncology) [name], [name], MD as Consulting Physician (General Surgery)  ASSESSMENT & PLAN:  Right ovarian epithelial cancer (HCC) Overall, she tolerated olaparib well We discussed close monitoring of her blood count while on treatment   Anemia due to antineoplastic chemotherapy This is likely anemia of chronic disease. The patient denies recent history of bleeding such as epistaxis, hematuria or hematochezia. She is asymptomatic from the anemia. We will observe for now.    Fistula I reviewed recent CT imaging It is not clear when she will go for reversal of her colostomy She would need to hold olaparib for 5 days prior to surgery  No orders of the defined types were placed in this encounter.   All questions were answered. The patient knows to call the clinic with any problems, questions or concerns. The total time spent in the appointment was 20 minutes encounter with patients including review of chart and various tests results, discussions about plan of care and coordination of care plan   [name], MD 10/21/2022 9:22 AM  INTERVAL HISTORY: Please see below for problem oriented charting. she returns for treatment follow-up on the lab. She tolerated treatment well except for occasional nausea No vomiting She is anxious about the timing of her surgery  REVIEW OF SYSTEMS:   Constitutional: Denies fevers, chills or abnormal weight loss Eyes: Denies blurriness of vision Ears, nose, mouth, throat, and face: Denies  mucositis or sore throat Respiratory: Denies cough, dyspnea or wheezes Cardiovascular: Denies palpitation, chest discomfort or lower extremity swelling Skin: Denies abnormal skin rashes Lymphatics: Denies new lymphadenopathy or easy bruising Neurological:Denies numbness, tingling or new weaknesses Behavioral/Psych: Mood is stable, no new changes  All other systems were reviewed with the patient and are negative.  I have reviewed the past medical history, past surgical history, social history and family history with the patient and they are unchanged from previous note.  ALLERGIES:  is allergic to paclitaxel, emend [fosaprepitant dimeglumine], codeine, nsaids, penicillins, vibra-tab [doxycycline], iodine, and latex.  MEDICATIONS:  Current Outpatient Medications  Medication Sig Dispense Refill   acetaminophen (TYLENOL) 500 MG tablet Take 2 tablets (1,000 mg total) by mouth every 6 (six) hours as needed for mild pain. 30 tablet 0   amLODipine (NORVASC) 5 MG tablet Take 5 mg by mouth daily. (Patient not taking: Reported on 08/27/2022)     aspirin EC 81 MG tablet Take 81 mg by mouth at bedtime. Swallow whole.     atenolol (TENORMIN) 25 MG tablet Take 0.5 tablets (12.5 mg total) by mouth 2 (two) times daily. 90 tablet 3   Calcium Carb-Cholecalciferol (CALCIUM + VITAMIN D3 PO) Take 1 tablet by mouth 2 (two) times daily.     EPINEPHrine 0.3 mg/0.3 mL IJ SOAJ injection Inject 0.3 mg into the muscle as needed for anaphylaxis. 2 each 2   levothyroxine (SYNTHROID) 100 MCG tablet Take 1 tablet (100 mcg total) by mouth in the morning. 90 tablet 3   Multiple Vitamin (MULTIVITAMIN) tablet Take 1 tablet by mouth in the morning.     olaparib (LYNPARZA) 100 MG tablet Take 1 tablet (100 mg total)  by mouth 2 (two) times daily. Swallow whole. May take with food to decrease nausea and vomiting. (Patient not taking: Reported on 08/27/2022) 60 tablet 11   ondansetron (ZOFRAN) 4 MG tablet Take 1 tablet (4 mg total) by  mouth every 6 (six) hours as needed for nausea. 20 tablet 0   oxyCODONE (OXY IR/ROXICODONE) 5 MG immediate release tablet Take 1 tablet (5 mg total) by mouth every 6 (six) hours as needed for severe pain. 10 tablet 0   simvastatin (ZOCOR) 20 MG tablet Take 20 mg by mouth daily.     No current facility-administered medications for this visit.    SUMMARY OF ONCOLOGIC HISTORY: Oncology History Overview Note  High grade papillary serous 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   Right ovarian epithelial cancer (HCC)  03/16/2022 Imaging   1. Large, mixed solid and cystic mass in the low pelvis, difficult to clearly delineate from adjacent ascites although at least 10.2 x 10.0 cm. This is highly concerning for primary ovarian malignancy and most likely arises from the right ovary, although may involve both ovaries. This may be further evaluated by contrast enhanced MRI of the pelvis if desired. 2. Large volume ascites throughout the abdomen and pelvis, presumed malignant. Stranding and nodularity of the omentum and peritoneum, particularly in the ventral abdomen and left upper quadrant, highly suspicious for peritoneal metastatic disease. 3. Cirrhotic morphology of the liver, which may contribute to ascites. 4. Severely atrophic left kidney, consistent with remote prior infectious, obstructive, or ischemic insult. 5. Coronary artery disease.   Aortic Atherosclerosis (ICD10-I70.0).   03/17/2022 Imaging   1. There is a large solid and cystic mass within the right adnexa which measures up to 9.5 cm in maximum dimension. Findings are concerning for a primary ovarian neoplasm. 2. Large volume of ascites within the abdomen and pelvis. Given the presence of peritoneal nodules on the CT from the prior day findings are concerning for malignant ascites.   03/19/2022 Procedure   CT-guided biopsy of omental mass.    03/19/2022 Procedure   Successful ultrasound-guided paracentesis yielding 4.2  liters of peritoneal fluid.   03/19/2022 Procedure   Successful placement of a left internal jugular approach power injectable Port-A-Cath. The catheter is ready for immediate use.   03/30/2022 Initial Diagnosis   Ovarian cancer (HCC)   03/30/2022 Pathology Results   A. RIGHT OVARY AND FALLOPIAN TUBE, SALPINGO OOPHORECTOMY: Poorly differentiated carcinoma immunohistochemically most consistent with a high grade serous carcinoma Tumor measures 10.7 x 10.6 x 4.6 cm Tumor involves ovarian and mesosalpinx serosal surfaces and right fallopian tube pT2a pN n/a  Note: The tumor consists of sheets and cords of high-grade nuclei with areas of necrosis and adjacent prominent stroma suggesting papillary cores the overall morphology is slightly unusual and not classic; therefore, a battery of immunohistochemical stains were performed to confirm the diagnosis and exclude a high-grade germ cell tumor.  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 Additionally there is nonspecific focal PLAP positivity; CD117 positivity and D2-40 positivity of uncertain clinical significance.  The tumor is negative for AFP, hCG, CD30, CD31, CK20, glypican-3, and OCT 3/4.  Dr. Luisa Hart has peer reviewed the case and agrees with diagnosis. Dr. Venetia Night has also initiated the workup of the case and agrees with the interpretation.  B. OMENTUM, RESECTION: Benign omentum Negative for tumor  C. RIGHT PELVIC SIDEWALL, BIOPSY: Benign mesothelium, fibrovascular stroma and  adipose Negative for tumor  D. ANTERIOR CUL DE SAC, BIOPSY: Benign mesothelium Negative for tumor  E. LEFT PELVIC SIDEWALL, BIOPSY: Benign mesothelium and adipose Negative for tumor   ONCOLOGY TABLE:  OVARY or FALLOPIAN TUBE or PRIMARY PERITONEUM: Resection  Procedure: Right salpingo-oophorectomy Specimen Integrity: Grossly focally  disrupted Tumor Site: Right ovary Tumor Size: 10.7 x 10.6 x 4.6 cm Histologic Type: Poorly differentiated carcinoma most consistent with a high-grade serous carcinoma Histologic Grade: High-grade Ovarian Surface Involvement: Surface involvement Fallopian Tube Surface Involvement: Mesosalpinx serosal surface involvement and right fallopian tube involvement Implants (required for advanced stage serous/seromucinous borderline tumors only): Not identified Other Tissue/ Organ Involvement: N/A Largest Extrapelvic Peritoneal Focus: N/A Peritoneal/Ascitic Fluid Involvement: N/A Chemotherapy Response Score (CRS): N/A, no known presurgical therapy Regional Lymph Nodes: N/A, no lymph nodes submitted Distant Metastasis:      Distant Site(s) Involved: N/A Pathologic Stage Classification (pTNM, AJCC 8th Edition): pT2a, pN N/A Ancillary Studies: Can be performed on physician request Representative Tumor Block: A8 Comment(s): [None]    03/30/2022 Surgery   Preop Diagnosis: Pelvic mass, malignant ascites   Postoperative Diagnosis: at least stage IIB ovarian cancer, suspected high grade serous by frozen section   Surgery: Diagnostic laparoscopy, right salpingo-oophorectomy, peritoneal biopsies, infra-colic omentectomy    Surgeon:  Carin Hock MD    Operative findings: On exam under anesthesia, somewhat mobile mass appreciated in the cul-de-sac to the right.  On agnostic laparoscopy, normal upper abdominal findings other than moderate volume ascites.  Normal-appearing peritoneum, omentum, small bowel.  Right adnexa enlarged with a mass measuring approximately 8-10 cm.  No carcinomatosis appreciated.  Given this, decision made to convert to an open procedure for tumor debulking.  Approximately 5 L of yellow-tinged ascites was removed upon intra-abdominal entry.  Normal upper abdominal survey including smooth diaphragm and liver, stomach, and omentum.  The small bowel was run from the cecum to the ligament  of Treitz with no abnormalities or carcinomatosis noted.  Right ovary replaced by an approximately 8 cm smooth mass, dilated and enlarged fallopian tube.  The mass itself was adherent to the right pelvic sidewall, sigmoid colon, and densely adherent to the anterior cul-de-sac/bladder peritoneum.  Frozen section consistent with invasive carcinoma, likely high-grade.  Left tube and ovary were surgically absent.  Sigmoid colon with adhesions to the left pelvic sidewall, left aspect of the bladder and the left vaginal cuff.  Somewhat redundant sigmoid which looped over on itself secondary to these adhesions.  Numerous diverticula noted of the sigmoid colon as well as the transverse colon.  The end of surgery, an R0 resection was accomplished.     04/13/2022 Cancer Staging   Staging form: Ovary, Fallopian Tube, and Primary Peritoneal Carcinoma, AJCC 8th Edition - Pathologic stage from 04/13/2022: Stage II (pT2, pN0, cM0) - Signed by [name], MD on 04/13/2022 Stage prefix: Initial diagnosis   04/26/2022 - 08/10/2022 Chemotherapy   Patient is on Treatment Plan : OVARIAN Carboplatin (AUC 6) + Paclitaxel (175) q21d X 6 Cycles     05/17/2022 Tumor Marker   Patient's tumor was tested for the following markers: CA-125. Results of the tumor marker test revealed 12.7.   06/06/2022 Genetic Testing   Germline: Negative [facility] Multi-Cancer +RNA Panel.  Report date is 06/06/2022.   The Multi-Cancer + RNA Panel offered by [facility] includes sequencing and/or deletion/duplication analysis of the following 70 genes:  AIP*, ALK, APC*, ATM*, AXIN2*, BAP1*, BARD1*, BLM*, BMPR1A*, BRCA1*, BRCA2*, BRIP1*, CDC73*, CDH1*, CDK4, CDKN1B*, CDKN2A, CHEK2*, CTNNA1*, DICER1*,  EPCAM (del/dup only), EGFR, FH*, FLCN*, GREM1 (promoter dup only), HOXB13, KIT, LZTR1, MAX*, MBD4, MEN1*, MET, MITF, MLH1*, MSH2*, MSH3*, MSH6*, MUTYH*, NF1*, NF2*, NTHL1*, PALB2*, PDGFRA, PMS2*, POLD1*, POLE*, POT1*, PRKAR1A*, PTCH1*, PTEN*, RAD51C*, RAD51D*, RB1*,  RET, SDHA* (sequencing only), SDHAF2*, SDHB*, SDHC*, SDHD*, SMAD4*, SMARCA4*, SMARCB1*, SMARCE1*, STK11*, SUFU*, TMEM127*, TP53*, TSC1*, TSC2*, VHL*. RNA analysis is performed for * genes.  Somatic: Positive genomic instability score (GIS) of 62 through [facility].  No pathogenic variants detected in BRCA1/2.  Report date is June 10, 2022.   [facility] CDx includes sequencing and large rearrangement analysis of BRCA1/2 and genomic instability status through loss of heterozygosity (LOH), telomeric allelic imbalance (TAI) and large-scale state transitions (LST).     06/08/2022 Tumor Marker   Patient's tumor was tested for the following markers: Ca-125. Results of the tumor marker test revealed 16.5.   06/13/2022 - 06/17/2022 Hospital Admission   The patient was admitted to the hospital and found to have diarrhea, diverticulitis and pancytopenia   06/13/2022 Imaging   Colonic diverticulosis. Inflammatory stranding around the mid sigmoid colon compatible with active diverticulitis.   Interval resection of the previously seen lower pelvic solid and cystic mass and resolution of ascites.   Aortic atherosclerosis.   Severely atrophic left kidney, stable since prior study.   06/30/2022 Tumor Marker   Patient's tumor was tested for the following markers: CA-125. Results of the tumor marker test revealed 15.   07/21/2022 Tumor Marker   Patient's tumor was tested for the following markers: CA-125. Results of the tumor marker test revealed 11.4.   08/12/2022 Tumor Marker   Patient's tumor was tested for the following markers: CA-125. Results of the tumor marker test revealed 9.7.   08/14/2022 Imaging   1. Wall thickening of the sigmoid colon with adjacent inflammatory change, overall improved when compared with prior exam. New focal collection of gas involving the wall of the sigmoid colon which extends inferiorly to the area of the left vaginal cuff with contrast material seen within the  vaginal cuff. 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 2. Wall thickening of the left lateral wall of the urinary bladder with air seen within the urinary bladder, findings raise concern for additional area of fistulization to the bladder. CT cystogram could be performed for better evaluation. 3. New pelviectasis and mild dilation of the left ureter, likely secondary to inflammatory change at the area of the left ureterovesicular junction. Chronic atrophy of the left kidney. 4. Increased wall thickening of a large colonic diverticulum of the proximal transverse colon, likely due to diverticulitis. 5. Aortic Atherosclerosis (ICD10-I70.0).     08/23/2022 Imaging   1. Increased large volume gas within the bladder. This raises concern for colovesical fistula. Consider CT cystogram for further evaluation. 2. Similar to slightly decreased inflammatory changes surrounding the sigmoid colon. The previous collection of gas involving the wall of the sigmoid colon has decreased. There is a small amount of residual gas in the area of the left vaginal cuff. There is again a soft tissue tract extending from the vaginal cuff to the sigmoid colon, though no gas is seen within this tract. 3. Decreased size and wall thickening about the large diverticulum in the proximal transverse colon. 4. Slight decrease in pelviectasis and dilation of the left ureter.   Aortic Atherosclerosis (ICD10-I70.0).     08/27/2022 Imaging   1. Continued sigmoid wall thickening and inflammatory reaction with diverticulosis with increased inflammation and layering reactive pelvic fluid since April 15.  Further evaluation to exclude underlying colonic lesion is recommended when clinically feasible. 2. There is now a demonstrable fistulous tract from the sigmoid colon into the left dorsal bladder wall, with air and fluid in the bladder and patchy enteric contrast now settling in the dependent bladder. 3. The known left-sided  colovaginal fistula is not well seen today but there is contrast in the vagina consistent with continued tract patency. 4. There is now moderate bilateral hydroureteronephrosis into the pelvis where the ureters are difficult to follow due to the pre-existing inflammatory process, with ureteral tethering by inflammatory process most likely causing the obstructive uropathy. No stone disease is seen. 5. Severe chronic atrophy of the left kidney. 6. Aortic and coronary artery atherosclerosis. 7. Anemia. 8. Prominent common bile duct at 12 mm, unchanged. 9. Constipation. 10. Osteopenia, scoliosis, and advanced degenerative change of the lumbar spine with acquired spinal stenosis L4-5.   09/01/2022 Surgery   Preoperative diagnosis: colovesical fistula   Postoperative diagnosis: same    Procedure: laparoscopic converted to open sigmoid resection with anastomosis, diverting loop ileostomy, take down of colovesical fistula   Surgeon: Feliciana Rossetti, M.D.   Asst: Carl Best, [facility]   Anesthesia: GETA   Indications for procedure: April Weber is a 77 y.o. year old female with symptoms of recurrent UTIs and abdominal pain.   Description of procedure: The patient was brought into the operative suite. Anesthesia was administered with General endotracheal anesthesia. WHO checklist was applied. The patient was then placed in lithotomy position. The area was prepped and draped in the usual sterile fashion.   Dr. Laverle Patter performed a cysto and plans ureteral stents prior to surgery.   Next, a left subcostal incision was made. A 5mm trocar was used to gain access to the peritoneal cavity by optical entry technique. Pneumoperitoneum was applied with a high flow and low pressure. The laparoscope was reinserted to confirm position.   3 additional trocars were placed 1 5 mm trocar in the right upper abdomen, 1 5 mm trocar in the infraumbilical space, and 1 12 mm trocar in the right lower quadrant.    There were no adhesions to the abdominal wall. The sigmoid colon was thickened and chronically inflamed. There was one loop of small intestine adhesed to the sigmoid colon, this was sharply dissected free. The White line of Toldt was incised. Blunt dissection was used to free up the colon from the bladder and abdominal wall. This was very difficult. During this dissection Dr. Melynda Keller came in to look at the abdomen and determined no evidence of cancer was present. After about 30 minutes of laparoscopic dissection, I did not think I could safely complete dissection laparoscopically and so a lower midline incision was made.   Next, palpation identified the ureters which were well away from the dissection. Blunt dissection was able to free the colon completely from the bladder and side wall. Next, a blue load contour stapler was used to divide the distal sigmoid colon. The mesentery was taken with ligasure device. The left colon was fully mobilized a portion of colon proximal to the inflammatory changes was purse stringed and divided distally. A 29 mm anvil was placed into the colon and purse string tied down.    Next, Dr. Laverle Patter came in to repair the bladder. See his dictation for more information.   The rectum was mobilized more by freeing it from the side walls. At this point I went below to palpate the rectum, there was some difficulty to  get the dilator to reach the staple line. There was an attempt to pass the stapler but this led to a tear in the distal rectum. Dr. Michaell Cowing came in at this time to assist and a further margin of sigmoid/rectum was taken with the contour stapler. This allowed the stapler to come to the end and anastomosis was performed. Leak test showed a small leak distal to the staple line that was repaired with interrupted 3-0 vicryl. Due to this leak and thin tissue at the area, decision was made to divert with loop ileostomy.   The 12 mm trocar site was enlarged and a portion of distal  ileum brought through the hole. A 19 fr blake drain was brought through RUQ incision and tip placed into the pelvis. The abdomen was irrigated. Hemostasis was intact. The peritoneum was closed with 2-0 vicryl. The fascia was closed with 0 PDS. The ileostomy was matured with 3-0 and 2-3 cm of Brooking. The skin was closed with staples. Ileostomy bag was placed. All counts were correct.   Findings: colovesical fistula, dense inflammation of the sigmoid colon   09/01/2022 Surgery   Preoperative diagnosis: 1.  Colovesical fistula 2.  Atrophic left kidney 3.  Right hydronephrosis   Postoperative diagnosis: 1.  Colovesical fistula 2.  Atrophic left kidney 3.  Right hydronephrosis   Procedures: 1.  Cystoscopy 2.  Bilateral retrograde pyelography with interpretation 3.  Bilateral ureteral stent placement (right -6 x 24, left -6 x 26) 4.  Repair of bladder fistula   10/01/2022 Tumor Marker   Patient's tumor was tested for the following markers: CA-125. Results of the tumor marker test revealed 14.   10/11/2022 -  Chemotherapy   She started taking olaparib     PHYSICAL EXAMINATION: ECOG PERFORMANCE STATUS: 1 - Symptomatic but completely ambulatory  Vitals:   10/21/22 0910  BP: 138/67  Pulse: 70  Resp: 18  Temp: 98.1 F (36.7 C)  SpO2: 100%   Filed Weights   10/21/22 0910  Weight: 144 lb 8 oz (65.5 kg)    GENERAL:alert, no distress and comfortable  NEURO: alert & oriented x 3 with fluent speech, no focal motor/sensory deficits  LABORATORY DATA:  I have reviewed the data as listed    Component Value Date/Time   NA 133 (L) 09/30/2022 1000   NA 134 06/01/2022 0848   K 4.6 09/30/2022 1000   CL 100 09/30/2022 1000   CO2 26 09/30/2022 1000   GLUCOSE 97 09/30/2022 1000   BUN 28 (H) 09/30/2022 1000   BUN 7 (L) 06/01/2022 0848   CREATININE 1.12 (H) 09/30/2022 1000   CREATININE 0.93 08/10/2022 0857   CREATININE 0.99 02/05/2013 0757   CALCIUM 9.5 09/30/2022 1000   PROT 6.7  09/30/2022 1000   PROT 6.0 06/01/2022 0848   ALBUMIN 3.9 09/30/2022 1000   ALBUMIN 3.6 (L) 06/01/2022 0848   AST 15 09/30/2022 1000   AST 12 (L) 08/10/2022 0857   ALT 12 09/30/2022 1000   ALT 11 08/10/2022 0857   ALKPHOS 62 09/30/2022 1000   BILITOT 0.4 09/30/2022 1000   BILITOT 0.3 08/10/2022 0857   GFRNONAA 51 (L) 09/30/2022 1000   GFRNONAA >60 08/10/2022 0857   GFRNONAA 59 (L) 02/05/2013 0757   GFRAA 61 05/20/2020 0850   GFRAA 68 02/05/2013 0757    No results found for: "SPEP", "UPEP"  Lab Results  Component Value Date   WBC 7.7 10/21/2022   NEUTROABS 5.7 10/21/2022   HGB 10.9 (L) 10/21/2022  HCT 31.8 (L) 10/21/2022   MCV 93.0 10/21/2022   PLT 213 10/21/2022      Chemistry      Component Value Date/Time   NA 133 (L) 09/30/2022 1000   NA 134 06/01/2022 0848   K 4.6 09/30/2022 1000   CL 100 09/30/2022 1000   CO2 26 09/30/2022 1000   BUN 28 (H) 09/30/2022 1000   BUN 7 (L) 06/01/2022 0848   CREATININE 1.12 (H) 09/30/2022 1000   CREATININE 0.93 08/10/2022 0857   CREATININE 0.99 02/05/2013 0757      Component Value Date/Time   CALCIUM 9.5 09/30/2022 1000   ALKPHOS 62 09/30/2022 1000   AST 15 09/30/2022 1000   AST 12 (L) 08/10/2022 0857   ALT 12 09/30/2022 1000   ALT 11 08/10/2022 0857   BILITOT 0.4 09/30/2022 1000   BILITOT 0.3 08/10/2022 0857       RADIOGRAPHIC STUDIES: I have personally reviewed the radiological images as listed and agreed with the findings in the report. DG BE (COLON)W SINGLE CM (SOL OR THIN BA)  Result Date: 10/07/2022 CLINICAL DATA:  Provided history: Diverticulitis. Rule out leak. Additional history: History of sigmoid colectomy (for diverticulitis with colovesical fistula) with anastomosis and diverting loop ileostomy. EXAM: SINGLE CONTRAST BARIUM ENEMA TECHNIQUE: After obtaining a scout radiograph, a single column contrast enema was performed using water-soluble contrast. The exam was performed by Brayton El PA-C , and was  supervised and interpreted by Dr. Jackey Loge. FLUOROSCOPY: Fluoroscopy time: 2 minutes 12 seconds (103.4 mGy). COMPARISON:  Fluoroscopic cystogram 09/06/2022. CT abdomen/pelvis 08/27/2022. FINDINGS: A scout radiograph of the abdomen/pelvis was acquired. Bilateral nephroureteral stents. No dilated bowel loops. Spondylosis and dextrocurvature of the lumbar spine. There was satisfactory contrast opacification of the colon to the level of the cecum. No evidence of contrast leak at the anastomosis site. No evidence of a residual/recurrent colovesical fistula. Diffuse colonic diverticulosis. IMPRESSION: 1. No evidence of contrast leak at the colonic anastomosis. 2. No evidence of a residual/recurrent colovesical fistula. 3. Diffuse colonic diverticulosis. Electronically Signed   By: Jackey Loge D.O.   On: 10/07/2022 10:48

## 2022-10-21 NOTE — Assessment & Plan Note (Signed)
Overall, she tolerated olaparib well We discussed close monitoring of her blood count while on treatment

## 2022-10-21 NOTE — Assessment & Plan Note (Signed)
This is likely anemia of chronic disease. The patient denies recent history of bleeding such as epistaxis, hematuria or hematochezia. She is asymptomatic from the anemia. We will observe for now.  

## 2022-10-21 NOTE — Telephone Encounter (Signed)
Called her with creatinine results today. She verbalized understanding.

## 2022-10-21 NOTE — Assessment & Plan Note (Signed)
I reviewed recent CT imaging It is not clear when she will go for reversal of her colostomy She would need to hold olaparib for 5 days prior to surgery

## 2022-10-23 LAB — CA 125: Cancer Antigen (CA) 125: 10.6 U/mL (ref 0.0–38.1)

## 2022-10-29 ENCOUNTER — Ambulatory Visit: Payer: Self-pay | Admitting: *Deleted

## 2022-10-29 NOTE — Patient Instructions (Signed)
Visit Information  Thank you for taking time to visit with me today. Please don't hesitate to contact me if I can be of assistance to you.   Following are the goals we discussed today:   Goals Addressed   None     Our next appointment is by telephone on 11/29/22 at 1130  Please call the care guide team at [phone] if you need to cancel or reschedule your appointment.   If you are experiencing a Mental Health or Behavioral Health Crisis or need someone to talk to, please call the Suicide and Crisis Lifeline: 988 call the [place] National Suicide Prevention Lifeline: (910) 600-8277 or TTY: 423-573-7243 TTY (906)013-2329) to talk to a trained counselor call 1-800-273-TALK (toll free, 24 hour hotline) call the [facility]: [phone] call 911   Patient verbalizes understanding of instructions and care plan provided today and agrees to view in MyChart. Active MyChart status and patient understanding of how to access instructions and care plan via MyChart confirmed with patient.     The patient has been provided with contact information for the care management team and has been advised to call with any health related questions or concerns.   Kavir Savoca L. Noelle Penner, RN, BSN, CCM [facility] Care Management Community Coordinator Office number [phone]

## 2022-10-29 NOTE — Patient Outreach (Signed)
  Care Coordination   Follow Up Visit Note   11/01/2022 Name: April Weber MRN: [id number] DOB: December 12, 1945  April Weber is a 77 y.o. year old female who sees April Ip, DO for primary care. I spoke with  April Weber by phone today.  What matters to the patients health and wellness today?  Maintaining well at home Explanation of benefit Letter from [employer] received and reviewed with patient Managing nutrition regarding her history of diverticulosis, and cancer  Voiced concerns with a change in [facility] services via the local news broadcast on 10/29/22  Ostomy awaiting her reversal - getting use to the home management Fatigue- energy slowly improving   Tremors decreased "not nearly as much as they did" now feels more related to fatigue/dehydration  Helps when take water/Gatorade Lynparza started 10/11/22- received a call from staff   Does have the access to a nutritionist as needed but os doing well Continues to stay in her a lot these days related to her oncology and gastrointestinal medical issues   Goals Addressed             This Visit's Progress    [facility] care coordination services   On track    Interventions Today    Flowsheet Row Most Recent Value  Chronic Disease   Chronic disease during today's visit Other  [ostomy, Explanation of benefit letter from [employer], nutrition, fatigue, tremors , follow up on initiated medication assistance for Lynparza]  General Interventions   General Interventions Discussed/Reviewed General Interventions Reviewed  Doctor Visits Discussed/Reviewed Doctor Visits Reviewed, PCP, Specialist  Durable Medical Equipment (DME) Other  [ostomy]  Exercise Interventions   Exercise Discussed/Reviewed Exercise Reviewed, Physical Activity  Physical Activity Discussed/Reviewed Physical Activity Reviewed, Home Exercise Program (HEP)  Education Interventions   Education Provided Provided Education  Provided Verbal Education On Nutrition,  Mental Health/Coping with Illness, Exercise, Medication, Insurance Plans, Other  [EOB letter]  Mental Health Interventions   Mental Health Discussed/Reviewed Mental Health Reviewed, Coping Strategies  Nutrition Interventions   Nutrition Discussed/Reviewed Nutrition Reviewed, Fluid intake, Supplemental nutrition  Pharmacy Interventions   Pharmacy Dicussed/Reviewed Pharmacy Topics Reviewed, Medications and their functions, Affording Medications              SDOH assessments and interventions completed:  No     Care Coordination Interventions:  Yes, provided   Follow up plan: Follow up call scheduled for 11/29/22    Encounter Outcome:  Pt. Visit Completed    Jahad Old L. Noelle Penner, RN, BSN, CCM [facility] Care Management Community Coordinator Office number [phone]

## 2022-11-01 ENCOUNTER — Telehealth: Payer: Self-pay

## 2022-11-01 ENCOUNTER — Inpatient Hospital Stay: Payer: Medicare Other

## 2022-11-01 ENCOUNTER — Other Ambulatory Visit: Payer: Self-pay

## 2022-11-01 ENCOUNTER — Inpatient Hospital Stay (HOSPITAL_BASED_OUTPATIENT_CLINIC_OR_DEPARTMENT_OTHER): Payer: Medicare Other | Admitting: Hematology and Oncology

## 2022-11-01 ENCOUNTER — Encounter: Payer: Self-pay | Admitting: Hematology and Oncology

## 2022-11-01 VITALS — BP 122/65 | HR 67 | Temp 97.9°F | Resp 18 | Ht 66.0 in | Wt 144.8 lb

## 2022-11-01 DIAGNOSIS — N1831 Chronic kidney disease, stage 3a: Secondary | ICD-10-CM | POA: Diagnosis not present

## 2022-11-01 DIAGNOSIS — T451X5A Adverse effect of antineoplastic and immunosuppressive drugs, initial encounter: Secondary | ICD-10-CM

## 2022-11-01 DIAGNOSIS — M8588 Other specified disorders of bone density and structure, other site: Secondary | ICD-10-CM | POA: Diagnosis not present

## 2022-11-01 DIAGNOSIS — C561 Malignant neoplasm of right ovary: Secondary | ICD-10-CM

## 2022-11-01 DIAGNOSIS — T451X5D Adverse effect of antineoplastic and immunosuppressive drugs, subsequent encounter: Secondary | ICD-10-CM | POA: Diagnosis not present

## 2022-11-01 DIAGNOSIS — D6481 Anemia due to antineoplastic chemotherapy: Secondary | ICD-10-CM | POA: Diagnosis not present

## 2022-11-01 DIAGNOSIS — R180 Malignant ascites: Secondary | ICD-10-CM | POA: Diagnosis not present

## 2022-11-01 LAB — CBC WITH DIFFERENTIAL/PLATELET
Abs Immature Granulocytes: 0.02 10*3/uL (ref 0.00–0.07)
Basophils Absolute: 0 10*3/uL (ref 0.0–0.1)
Basophils Relative: 0 %
Eosinophils Absolute: 0.2 10*3/uL (ref 0.0–0.5)
Eosinophils Relative: 3 %
HCT: 32.7 % — ABNORMAL LOW (ref 36.0–46.0)
Hemoglobin: 11.3 g/dL — ABNORMAL LOW (ref 12.0–15.0)
Immature Granulocytes: 0 %
Lymphocytes Relative: 8 %
Lymphs Abs: 0.5 10*3/uL — ABNORMAL LOW (ref 0.7–4.0)
MCH: 32.2 pg (ref 26.0–34.0)
MCHC: 34.6 g/dL (ref 30.0–36.0)
MCV: 93.2 fL (ref 80.0–100.0)
Monocytes Absolute: 0.5 10*3/uL (ref 0.1–1.0)
Monocytes Relative: 7 %
Neutro Abs: 5.6 10*3/uL (ref 1.7–7.7)
Neutrophils Relative %: 82 %
Platelets: 201 10*3/uL (ref 150–400)
RBC: 3.51 MIL/uL — ABNORMAL LOW (ref 3.87–5.11)
RDW: 14.5 % (ref 11.5–15.5)
WBC: 6.9 10*3/uL (ref 4.0–10.5)
nRBC: 0 % (ref 0.0–0.2)

## 2022-11-01 LAB — COMPREHENSIVE METABOLIC PANEL
ALT: 12 U/L (ref 0–44)
AST: 15 U/L (ref 15–41)
Albumin: 3.8 g/dL (ref 3.5–5.0)
Alkaline Phosphatase: 50 U/L (ref 38–126)
Anion gap: 7 (ref 5–15)
BUN: 22 mg/dL (ref 8–23)
CO2: 28 mmol/L (ref 22–32)
Calcium: 9.7 mg/dL (ref 8.9–10.3)
Chloride: 103 mmol/L (ref 98–111)
Creatinine, Ser: 1.44 mg/dL — ABNORMAL HIGH (ref 0.44–1.00)
GFR, Estimated: 37 mL/min — ABNORMAL LOW (ref >60)
Glucose, Bld: 82 mg/dL (ref 70–99)
Potassium: 3.7 mmol/L (ref 3.5–5.1)
Sodium: 138 mmol/L (ref 135–145)
Total Bilirubin: 0.5 mg/dL (ref 0.3–1.2)
Total Protein: 6.4 g/dL — ABNORMAL LOW (ref 6.5–8.1)

## 2022-11-01 NOTE — Assessment & Plan Note (Signed)
This is likely anemia of chronic disease. The patient denies recent history of bleeding such as epistaxis, hematuria or hematochezia. She is asymptomatic from the anemia. We will observe for now.  

## 2022-11-01 NOTE — Assessment & Plan Note (Signed)
Overall, she tolerated olaparib well We discussed close monitoring of her blood count while on treatment Anemia is actually improving She will continue treatment as prescribed I will see her next month for further follow-up The patient is interested to get her port removed and I will put in the order

## 2022-11-01 NOTE — Assessment & Plan Note (Signed)
She is prone to get dehydrated I recommend frequent oral fluid intake 

## 2022-11-01 NOTE — Telephone Encounter (Signed)
Returned her call. She is concerned about creatinine of 1.44. Per Dr. Bertis Ruddy, lynparza can cause elevated creatinine and Dr. Bertis Ruddy is not worried. She verbalized understanding.  She will call the office back for questions/ concerns.

## 2022-11-01 NOTE — Progress Notes (Signed)
[facility] OFFICE PROGRESS NOTE  [name], DO as PCP - General (Family Medicine) [name], MD as Attending Physician (Obstetrics and Gynecology) [name], MD as Consulting Physician (Family Medicine) [name], OD (Optometry) [name], RN as [facility] Care Management Care, [facility] Services) [name], MD as Consulting Physician (Hematology and Oncology) [name], [name], MD as Consulting Physician (General Surgery)  ASSESSMENT & PLAN:  Right ovarian epithelial cancer (HCC) Overall, she tolerated olaparib well We discussed close monitoring of her blood count while on treatment Anemia is actually improving She will continue treatment as prescribed I will see her next month for further follow-up The patient is interested to get her port removed and I will put in the order  Stage 3a chronic kidney disease (HCC) She is prone to get dehydrated I recommend frequent oral fluid intake  Anemia due to antineoplastic chemotherapy This is likely anemia of chronic disease. The patient denies recent history of bleeding such as epistaxis, hematuria or hematochezia. She is asymptomatic from the anemia. We will observe for now.    Orders Placed This Encounter  Procedures   IR REMOVAL TUN ACCESS W/ PORT W/O FL MOD SED    Standing Status:   Future    Standing Expiration Date:   11/01/2023    Order Specific Question:   Reason for exam:    Answer:   no need port    Order Specific Question:   Preferred Imaging Location?    Answer:   [facility]    All questions were answered. The patient knows to call the clinic with any problems, questions or concerns. The total time spent in the appointment was 20 minutes encounter with patients including review of chart and various tests results, discussions about plan of care and coordination of care plan   [name], MD 11/01/2022  9:02 AM  INTERVAL HISTORY: Please see below for problem oriented charting. she returns for treatment follow-up on olaparib She is doing well Denies recent nausea No recent bleeding We discussed risk and benefits of port removal and future follow-up  REVIEW OF SYSTEMS:   Constitutional: Denies fevers, chills or abnormal weight loss Eyes: Denies blurriness of vision Ears, nose, mouth, throat, and face: Denies mucositis or sore throat Respiratory: Denies cough, dyspnea or wheezes Cardiovascular: Denies palpitation, chest discomfort or lower extremity swelling Gastrointestinal:  Denies nausea, heartburn or change in bowel habits Skin: Denies abnormal skin rashes Lymphatics: Denies new lymphadenopathy or easy bruising Neurological:Denies numbness, tingling or new weaknesses Behavioral/Psych: Mood is stable, no new changes  All other systems were reviewed with the patient and are negative.  I have reviewed the past medical history, past surgical history, social history and family history with the patient and they are unchanged from previous note.  ALLERGIES:  is allergic to paclitaxel, emend [fosaprepitant dimeglumine], codeine, nsaids, penicillins, vibra-tab [doxycycline], iodine, and latex.  MEDICATIONS:  Current Outpatient Medications  Medication Sig Dispense Refill   acetaminophen (TYLENOL) 500 MG tablet Take 2 tablets (1,000 mg total) by mouth every 6 (six) hours as needed for mild pain. 30 tablet 0   amLODipine (NORVASC) 5 MG tablet Take 5 mg by mouth daily. (Patient not taking: Reported on 08/27/2022)     aspirin EC 81 MG tablet Take 81 mg by mouth at bedtime. Swallow whole.     atenolol (TENORMIN) 25 MG tablet Take 0.5 tablets (12.5 mg total) by mouth 2 (two)  times daily. 90 tablet 3   Calcium Carb-Cholecalciferol (CALCIUM + VITAMIN D3 PO) Take 1 tablet by mouth 2 (two) times daily.     EPINEPHrine 0.3 mg/0.3 mL IJ SOAJ injection Inject 0.3 mg into the muscle as needed for  anaphylaxis. 2 each 2   levothyroxine (SYNTHROID) 100 MCG tablet Take 1 tablet (100 mcg total) by mouth in the morning. 90 tablet 3   Multiple Vitamin (MULTIVITAMIN) tablet Take 1 tablet by mouth in the morning.     olaparib (LYNPARZA) 100 MG tablet Take 1 tablet (100 mg total) by mouth 2 (two) times daily. Swallow whole. May take with food to decrease nausea and vomiting. (Patient not taking: Reported on 08/27/2022) 60 tablet 11   ondansetron (ZOFRAN) 4 MG tablet Take 1 tablet (4 mg total) by mouth every 6 (six) hours as needed for nausea. 20 tablet 0   simvastatin (ZOCOR) 20 MG tablet Take 20 mg by mouth daily.     No current facility-administered medications for this visit.    SUMMARY OF ONCOLOGIC HISTORY: Oncology History Overview Note  High grade papillary serous 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   Right ovarian epithelial cancer (HCC)  03/16/2022 Imaging   1. Large, mixed solid and cystic mass in the low pelvis, difficult to clearly delineate from adjacent ascites although at least 10.2 x 10.0 cm. This is highly concerning for primary ovarian malignancy and most likely arises from the right ovary, although may involve both ovaries. This may be further evaluated by contrast enhanced MRI of the pelvis if desired. 2. Large volume ascites throughout the abdomen and pelvis, presumed malignant. Stranding and nodularity of the omentum and peritoneum, particularly in the ventral abdomen and left upper quadrant, highly suspicious for peritoneal metastatic disease. 3. Cirrhotic morphology of the liver, which may contribute to ascites. 4. Severely atrophic left kidney, consistent with remote prior infectious, obstructive, or ischemic insult. 5. Coronary artery disease.   Aortic Atherosclerosis (ICD10-I70.0).   03/17/2022 Imaging   1. There is a large solid and cystic mass within the right adnexa which measures up to 9.5 cm in maximum dimension. Findings are concerning for a  primary ovarian neoplasm. 2. Large volume of ascites within the abdomen and pelvis. Given the presence of peritoneal nodules on the CT from the prior day findings are concerning for malignant ascites.   03/19/2022 Procedure   CT-guided biopsy of omental mass.    03/19/2022 Procedure   Successful ultrasound-guided paracentesis yielding 4.2 liters of peritoneal fluid.   03/19/2022 Procedure   Successful placement of a left internal jugular approach power injectable Port-A-Cath. The catheter is ready for immediate use.   03/30/2022 Initial Diagnosis   Ovarian cancer (HCC)   03/30/2022 Pathology Results   A. RIGHT OVARY AND FALLOPIAN TUBE, SALPINGO OOPHORECTOMY: Poorly differentiated carcinoma immunohistochemically most consistent with a high grade serous carcinoma Tumor measures 10.7 x 10.6 x 4.6 cm Tumor involves ovarian and mesosalpinx serosal surfaces and right fallopian tube pT2a pN n/a  Note: The tumor consists of sheets and cords of high-grade nuclei with areas of necrosis and adjacent prominent stroma suggesting papillary cores the overall morphology is slightly unusual and not classic; therefore, a battery of immunohistochemical stains were performed to confirm the diagnosis and exclude a high-grade germ cell tumor.  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 Additionally there is nonspecific focal PLAP positivity; CD117 positivity and D2-40  positivity of uncertain clinical significance.  The tumor is negative for AFP, hCG, CD30, CD31, CK20, glypican-3, and OCT 3/4.  Dr. Luisa Hart has peer reviewed the case and agrees with diagnosis. Dr. Venetia Night has also initiated the workup of the case and agrees with the interpretation.  B. OMENTUM, RESECTION: Benign omentum Negative for tumor  C. RIGHT PELVIC SIDEWALL, BIOPSY: Benign mesothelium, fibrovascular stroma and adipose Negative  for tumor  D. ANTERIOR CUL DE SAC, BIOPSY: Benign mesothelium Negative for tumor  E. LEFT PELVIC SIDEWALL, BIOPSY: Benign mesothelium and adipose Negative for tumor   ONCOLOGY TABLE:  OVARY or FALLOPIAN TUBE or PRIMARY PERITONEUM: Resection  Procedure: Right salpingo-oophorectomy Specimen Integrity: Grossly focally disrupted Tumor Site: Right ovary Tumor Size: 10.7 x 10.6 x 4.6 cm Histologic Type: Poorly differentiated carcinoma most consistent with a high-grade serous carcinoma Histologic Grade: High-grade Ovarian Surface Involvement: Surface involvement Fallopian Tube Surface Involvement: Mesosalpinx serosal surface involvement and right fallopian tube involvement Implants (required for advanced stage serous/seromucinous borderline tumors only): Not identified Other Tissue/ Organ Involvement: N/A Largest Extrapelvic Peritoneal Focus: N/A Peritoneal/Ascitic Fluid Involvement: N/A Chemotherapy Response Score (CRS): N/A, no known presurgical therapy Regional Lymph Nodes: N/A, no lymph nodes submitted Distant Metastasis:      Distant Site(s) Involved: N/A Pathologic Stage Classification (pTNM, AJCC 8th Edition): pT2a, pN N/A Ancillary Studies: Can be performed on physician request Representative Tumor Block: A8 Comment(s): [None]    03/30/2022 Surgery   Preop Diagnosis: Pelvic mass, malignant ascites   Postoperative Diagnosis: at least stage IIB ovarian cancer, suspected high grade serous by frozen section   Surgery: Diagnostic laparoscopy, right salpingo-oophorectomy, peritoneal biopsies, infra-colic omentectomy    Surgeon:  Carin Hock MD    Operative findings: On exam under anesthesia, somewhat mobile mass appreciated in the cul-de-sac to the right.  On agnostic laparoscopy, normal upper abdominal findings other than moderate volume ascites.  Normal-appearing peritoneum, omentum, small bowel.  Right adnexa enlarged with a mass measuring approximately 8-10 cm.  No  carcinomatosis appreciated.  Given this, decision made to convert to an open procedure for tumor debulking.  Approximately 5 L of yellow-tinged ascites was removed upon intra-abdominal entry.  Normal upper abdominal survey including smooth diaphragm and liver, stomach, and omentum.  The small bowel was run from the cecum to the ligament of Treitz with no abnormalities or carcinomatosis noted.  Right ovary replaced by an approximately 8 cm smooth mass, dilated and enlarged fallopian tube.  The mass itself was adherent to the right pelvic sidewall, sigmoid colon, and densely adherent to the anterior cul-de-sac/bladder peritoneum.  Frozen section consistent with invasive carcinoma, likely high-grade.  Left tube and ovary were surgically absent.  Sigmoid colon with adhesions to the left pelvic sidewall, left aspect of the bladder and the left vaginal cuff.  Somewhat redundant sigmoid which looped over on itself secondary to these adhesions.  Numerous diverticula noted of the sigmoid colon as well as the transverse colon.  The end of surgery, an R0 resection was accomplished.     04/13/2022 Cancer Staging   Staging form: Ovary, Fallopian Tube, and Primary Peritoneal Carcinoma, AJCC 8th Edition - Pathologic stage from 04/13/2022: Stage II (pT2, pN0, cM0) - Signed by [name], MD on 04/13/2022 Stage prefix: Initial diagnosis   04/26/2022 - 08/10/2022 Chemotherapy   Patient is on Treatment Plan : OVARIAN Carboplatin (AUC 6) + Paclitaxel (175) q21d X 6 Cycles     05/17/2022 Tumor Marker   Patient's tumor was tested for the following markers: CA-125.  Results of the tumor marker test revealed 12.7.   06/06/2022 Genetic Testing   Germline: Negative [facility] Multi-Cancer +RNA Panel.  Report date is 06/06/2022.   The Multi-Cancer + RNA Panel offered by [facility] includes sequencing and/or deletion/duplication analysis of the following 70 genes:  AIP*, ALK, APC*, ATM*, AXIN2*, BAP1*, BARD1*, BLM*, BMPR1A*, BRCA1*, BRCA2*,  BRIP1*, CDC73*, CDH1*, CDK4, CDKN1B*, CDKN2A, CHEK2*, CTNNA1*, DICER1*, EPCAM (del/dup only), EGFR, FH*, FLCN*, GREM1 (promoter dup only), HOXB13, KIT, LZTR1, MAX*, MBD4, MEN1*, MET, MITF, MLH1*, MSH2*, MSH3*, MSH6*, MUTYH*, NF1*, NF2*, NTHL1*, PALB2*, PDGFRA, PMS2*, POLD1*, POLE*, POT1*, PRKAR1A*, PTCH1*, PTEN*, RAD51C*, RAD51D*, RB1*, RET, SDHA* (sequencing only), SDHAF2*, SDHB*, SDHC*, SDHD*, SMAD4*, SMARCA4*, SMARCB1*, SMARCE1*, STK11*, SUFU*, TMEM127*, TP53*, TSC1*, TSC2*, VHL*. RNA analysis is performed for * genes.  Somatic: Positive genomic instability score (GIS) of 62 through [facility].  No pathogenic variants detected in BRCA1/2.  Report date is June 10, 2022.   [facility] CDx includes sequencing and large rearrangement analysis of BRCA1/2 and genomic instability status through loss of heterozygosity (LOH), telomeric allelic imbalance (TAI) and large-scale state transitions (LST).     06/08/2022 Tumor Marker   Patient's tumor was tested for the following markers: Ca-125. Results of the tumor marker test revealed 16.5.   06/13/2022 - 06/17/2022 Hospital Admission   The patient was admitted to the hospital and found to have diarrhea, diverticulitis and pancytopenia   06/13/2022 Imaging   Colonic diverticulosis. Inflammatory stranding around the mid sigmoid colon compatible with active diverticulitis.   Interval resection of the previously seen lower pelvic solid and cystic mass and resolution of ascites.   Aortic atherosclerosis.   Severely atrophic left kidney, stable since prior study.   06/30/2022 Tumor Marker   Patient's tumor was tested for the following markers: CA-125. Results of the tumor marker test revealed 15.   07/21/2022 Tumor Marker   Patient's tumor was tested for the following markers: CA-125. Results of the tumor marker test revealed 11.4.   08/12/2022 Tumor Marker   Patient's tumor was tested for the following markers: CA-125. Results of the tumor marker  test revealed 9.7.   08/14/2022 Imaging   1. Wall thickening of the sigmoid colon with adjacent inflammatory change, overall improved when compared with prior exam. New focal collection of gas involving the wall of the sigmoid colon which extends inferiorly to the area of the left vaginal cuff with contrast material seen within the vaginal cuff. 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 2. Wall thickening of the left lateral wall of the urinary bladder with air seen within the urinary bladder, findings raise concern for additional area of fistulization to the bladder. CT cystogram could be performed for better evaluation. 3. New pelviectasis and mild dilation of the left ureter, likely secondary to inflammatory change at the area of the left ureterovesicular junction. Chronic atrophy of the left kidney. 4. Increased wall thickening of a large colonic diverticulum of the proximal transverse colon, likely due to diverticulitis. 5. Aortic Atherosclerosis (ICD10-I70.0).     08/23/2022 Imaging   1. Increased large volume gas within the bladder. This raises concern for colovesical fistula. Consider CT cystogram for further evaluation. 2. Similar to slightly decreased inflammatory changes surrounding the sigmoid colon. The previous collection of gas involving the wall of the sigmoid colon has decreased. There is a small amount of residual gas in the area of the left vaginal cuff. There is again a soft tissue tract extending from the vaginal cuff to the sigmoid colon, though  no gas is seen within this tract. 3. Decreased size and wall thickening about the large diverticulum in the proximal transverse colon. 4. Slight decrease in pelviectasis and dilation of the left ureter.   Aortic Atherosclerosis (ICD10-I70.0).     08/27/2022 Imaging   1. Continued sigmoid wall thickening and inflammatory reaction with diverticulosis with increased inflammation and layering reactive pelvic fluid since April 15.  Further evaluation to exclude underlying colonic lesion is recommended when clinically feasible. 2. There is now a demonstrable fistulous tract from the sigmoid colon into the left dorsal bladder wall, with air and fluid in the bladder and patchy enteric contrast now settling in the dependent bladder. 3. The known left-sided colovaginal fistula is not well seen today but there is contrast in the vagina consistent with continued tract patency. 4. There is now moderate bilateral hydroureteronephrosis into the pelvis where the ureters are difficult to follow due to the pre-existing inflammatory process, with ureteral tethering by inflammatory process most likely causing the obstructive uropathy. No stone disease is seen. 5. Severe chronic atrophy of the left kidney. 6. Aortic and coronary artery atherosclerosis. 7. Anemia. 8. Prominent common bile duct at 12 mm, unchanged. 9. Constipation. 10. Osteopenia, scoliosis, and advanced degenerative change of the lumbar spine with acquired spinal stenosis L4-5.   09/01/2022 Surgery   Preoperative diagnosis: colovesical fistula   Postoperative diagnosis: same    Procedure: laparoscopic converted to open sigmoid resection with anastomosis, diverting loop ileostomy, take down of colovesical fistula   Surgeon: Feliciana Rossetti, M.D.   Asst: Carl Best, [facility]   Anesthesia: GETA   Indications for procedure: April Weber is a 77 y.o. year old female with symptoms of recurrent UTIs and abdominal pain.   Description of procedure: The patient was brought into the operative suite. Anesthesia was administered with General endotracheal anesthesia. WHO checklist was applied. The patient was then placed in lithotomy position. The area was prepped and draped in the usual sterile fashion.   Dr. Laverle Patter performed a cysto and plans ureteral stents prior to surgery.   Next, a left subcostal incision was made. A 5mm trocar was used to gain access to the  peritoneal cavity by optical entry technique. Pneumoperitoneum was applied with a high flow and low pressure. The laparoscope was reinserted to confirm position.   3 additional trocars were placed 1 5 mm trocar in the right upper abdomen, 1 5 mm trocar in the infraumbilical space, and 1 12 mm trocar in the right lower quadrant.   There were no adhesions to the abdominal wall. The sigmoid colon was thickened and chronically inflamed. There was one loop of small intestine adhesed to the sigmoid colon, this was sharply dissected free. The White line of Toldt was incised. Blunt dissection was used to free up the colon from the bladder and abdominal wall. This was very difficult. During this dissection Dr. Melynda Keller came in to look at the abdomen and determined no evidence of cancer was present. After about 30 minutes of laparoscopic dissection, I did not think I could safely complete dissection laparoscopically and so a lower midline incision was made.   Next, palpation identified the ureters which were well away from the dissection. Blunt dissection was able to free the colon completely from the bladder and side wall. Next, a blue load contour stapler was used to divide the distal sigmoid colon. The mesentery was taken with ligasure device. The left colon was fully mobilized a portion of colon proximal to the inflammatory changes  was purse stringed and divided distally. A 29 mm anvil was placed into the colon and purse string tied down.    Next, Dr. Laverle Patter came in to repair the bladder. See his dictation for more information.   The rectum was mobilized more by freeing it from the side walls. At this point I went below to palpate the rectum, there was some difficulty to get the dilator to reach the staple line. There was an attempt to pass the stapler but this led to a tear in the distal rectum. Dr. Michaell Cowing came in at this time to assist and a further margin of sigmoid/rectum was taken with the contour stapler. This  allowed the stapler to come to the end and anastomosis was performed. Leak test showed a small leak distal to the staple line that was repaired with interrupted 3-0 vicryl. Due to this leak and thin tissue at the area, decision was made to divert with loop ileostomy.   The 12 mm trocar site was enlarged and a portion of distal ileum brought through the hole. A 19 fr blake drain was brought through RUQ incision and tip placed into the pelvis. The abdomen was irrigated. Hemostasis was intact. The peritoneum was closed with 2-0 vicryl. The fascia was closed with 0 PDS. The ileostomy was matured with 3-0 and 2-3 cm of Brooking. The skin was closed with staples. Ileostomy bag was placed. All counts were correct.   Findings: colovesical fistula, dense inflammation of the sigmoid colon   09/01/2022 Surgery   Preoperative diagnosis: 1.  Colovesical fistula 2.  Atrophic left kidney 3.  Right hydronephrosis   Postoperative diagnosis: 1.  Colovesical fistula 2.  Atrophic left kidney 3.  Right hydronephrosis   Procedures: 1.  Cystoscopy 2.  Bilateral retrograde pyelography with interpretation 3.  Bilateral ureteral stent placement (right -6 x 24, left -6 x 26) 4.  Repair of bladder fistula   10/01/2022 Tumor Marker   Patient's tumor was tested for the following markers: CA-125. Results of the tumor marker test revealed 14.   10/11/2022 -  Chemotherapy   She started taking olaparib   10/25/2022 Tumor Marker   Patient's tumor was tested for the following markers: CA-125. Results of the tumor marker test revealed 10.6.     PHYSICAL EXAMINATION: ECOG PERFORMANCE STATUS: 0 - Asymptomatic  Vitals:   11/01/22 0821  BP: 122/65  Pulse: 67  Resp: 18  Temp: 97.9 F (36.6 C)  SpO2: 100%   Filed Weights   11/01/22 0821  Weight: 144 lb 12.8 oz (65.7 kg)    GENERAL:alert, no distress and comfortable NEURO: alert & oriented x 3 with fluent speech, no focal motor/sensory deficits  LABORATORY  DATA:  I have reviewed the data as listed    Component Value Date/Time   NA 138 11/01/2022 0805   NA 134 06/01/2022 0848   K 3.7 11/01/2022 0805   CL 103 11/01/2022 0805   CO2 28 11/01/2022 0805   GLUCOSE 82 11/01/2022 0805   BUN 22 11/01/2022 0805   BUN 7 (L) 06/01/2022 0848   CREATININE 1.44 (H) 11/01/2022 0805   CREATININE 0.93 08/10/2022 0857   CREATININE 0.99 02/05/2013 0757   CALCIUM 9.7 11/01/2022 0805   PROT 6.4 (L) 11/01/2022 0805   PROT 6.0 06/01/2022 0848   ALBUMIN 3.8 11/01/2022 0805   ALBUMIN 3.6 (L) 06/01/2022 0848   AST 15 11/01/2022 0805   AST 12 (L) 08/10/2022 0857   ALT 12 11/01/2022 0805   ALT  11 08/10/2022 0857   ALKPHOS 50 11/01/2022 0805   BILITOT 0.5 11/01/2022 0805   BILITOT 0.3 08/10/2022 0857   GFRNONAA 37 (L) 11/01/2022 0805   GFRNONAA >60 08/10/2022 0857   GFRNONAA 59 (L) 02/05/2013 0757   GFRAA 61 05/20/2020 0850   GFRAA 68 02/05/2013 0757    No results found for: "SPEP", "UPEP"  Lab Results  Component Value Date   WBC 6.9 11/01/2022   NEUTROABS 5.6 11/01/2022   HGB 11.3 (L) 11/01/2022   HCT 32.7 (L) 11/01/2022   MCV 93.2 11/01/2022   PLT 201 11/01/2022      Chemistry      Component Value Date/Time   NA 138 11/01/2022 0805   NA 134 06/01/2022 0848   K 3.7 11/01/2022 0805   CL 103 11/01/2022 0805   CO2 28 11/01/2022 0805   BUN 22 11/01/2022 0805   BUN 7 (L) 06/01/2022 0848   CREATININE 1.44 (H) 11/01/2022 0805   CREATININE 0.93 08/10/2022 0857   CREATININE 0.99 02/05/2013 0757      Component Value Date/Time   CALCIUM 9.7 11/01/2022 0805   ALKPHOS 50 11/01/2022 0805   AST 15 11/01/2022 0805   AST 12 (L) 08/10/2022 0857   ALT 12 11/01/2022 0805   ALT 11 08/10/2022 0857   BILITOT 0.5 11/01/2022 0805   BILITOT 0.3 08/10/2022 0857       RADIOGRAPHIC STUDIES: I have personally reviewed the radiological images as listed and agreed with the findings in the report. DG BE (COLON)W SINGLE CM (SOL OR THIN BA)  Result Date:  10/07/2022 CLINICAL DATA:  Provided history: Diverticulitis. Rule out leak. Additional history: History of sigmoid colectomy (for diverticulitis with colovesical fistula) with anastomosis and diverting loop ileostomy. EXAM: SINGLE CONTRAST BARIUM ENEMA TECHNIQUE: After obtaining a scout radiograph, a single column contrast enema was performed using water-soluble contrast. The exam was performed by Brayton El PA-C , and was supervised and interpreted by Dr. Jackey Loge. FLUOROSCOPY: Fluoroscopy time: 2 minutes 12 seconds (103.4 mGy). COMPARISON:  Fluoroscopic cystogram 09/06/2022. CT abdomen/pelvis 08/27/2022. FINDINGS: A scout radiograph of the abdomen/pelvis was acquired. Bilateral nephroureteral stents. No dilated bowel loops. Spondylosis and dextrocurvature of the lumbar spine. There was satisfactory contrast opacification of the colon to the level of the cecum. No evidence of contrast leak at the anastomosis site. No evidence of a residual/recurrent colovesical fistula. Diffuse colonic diverticulosis. IMPRESSION: 1. No evidence of contrast leak at the colonic anastomosis. 2. No evidence of a residual/recurrent colovesical fistula. 3. Diffuse colonic diverticulosis. Electronically Signed   By: Jackey Loge D.O.   On: 10/07/2022 10:48

## 2022-11-02 LAB — CA 125: Cancer Antigen (CA) 125: 10.2 U/mL (ref 0.0–38.1)

## 2022-11-10 DIAGNOSIS — H524 Presbyopia: Secondary | ICD-10-CM | POA: Diagnosis not present

## 2022-11-10 DIAGNOSIS — H25813 Combined forms of age-related cataract, bilateral: Secondary | ICD-10-CM | POA: Diagnosis not present

## 2022-11-10 DIAGNOSIS — H52223 Regular astigmatism, bilateral: Secondary | ICD-10-CM | POA: Diagnosis not present

## 2022-11-10 DIAGNOSIS — D3132 Benign neoplasm of left choroid: Secondary | ICD-10-CM | POA: Diagnosis not present

## 2022-11-10 DIAGNOSIS — H5213 Myopia, bilateral: Secondary | ICD-10-CM | POA: Diagnosis not present

## 2022-11-12 ENCOUNTER — Ambulatory Visit: Payer: Self-pay | Admitting: General Surgery

## 2022-11-12 DIAGNOSIS — Z01818 Encounter for other preprocedural examination: Secondary | ICD-10-CM

## 2022-11-12 DIAGNOSIS — N321 Vesicointestinal fistula: Secondary | ICD-10-CM

## 2022-11-15 ENCOUNTER — Ambulatory Visit (HOSPITAL_COMMUNITY)
Admission: RE | Admit: 2022-11-15 | Discharge: 2022-11-15 | Disposition: A | Discharge status: Home / Self Care | Payer: Medicare Other | Source: Ambulatory Visit | Attending: Hematology and Oncology | Admitting: Hematology and Oncology

## 2022-11-15 DIAGNOSIS — C561 Malignant neoplasm of right ovary: Secondary | ICD-10-CM | POA: Diagnosis not present

## 2022-11-15 DIAGNOSIS — Z452 Encounter for adjustment and management of vascular access device: Secondary | ICD-10-CM | POA: Insufficient documentation

## 2022-11-15 HISTORY — PX: IR REMOVAL TUN ACCESS W/ PORT W/O FL MOD SED: IMG2290

## 2022-11-15 MED ORDER — LIDOCAINE-EPINEPHRINE 1 %-1:100000 IJ SOLN
20.0000 mL | Freq: Once | INTRAMUSCULAR | Status: AC
  Administered 2022-11-15: 10 mL via INTRADERMAL

## 2022-11-15 MED ORDER — LIDOCAINE-EPINEPHRINE 1 %-1:100000 IJ SOLN
INTRAMUSCULAR | Status: AC
  Filled 2022-11-15: qty 1

## 2022-11-15 NOTE — Procedures (Signed)
Pre Procedural Dx: Poor venous access Post Procedural Dx: Same  Successful removal of anterior chest wall port-a-cath.  EBL: Trace No immediate post procedural complications.   Jay Mia Winthrop, MD Pager #: [phone]  

## 2022-11-23 ENCOUNTER — Telehealth: Payer: Self-pay | Admitting: Family Medicine

## 2022-11-23 DIAGNOSIS — N1831 Chronic kidney disease, stage 3a: Secondary | ICD-10-CM

## 2022-11-23 DIAGNOSIS — E890 Postprocedural hypothyroidism: Secondary | ICD-10-CM

## 2022-11-23 DIAGNOSIS — C561 Malignant neoplasm of right ovary: Secondary | ICD-10-CM

## 2022-11-23 DIAGNOSIS — Z932 Ileostomy status: Secondary | ICD-10-CM

## 2022-11-23 NOTE — Addendum Note (Signed)
Addended by: Raliegh Ip on: 11/23/2022 03:04 PM   Modules accepted: Orders

## 2022-11-23 NOTE — Telephone Encounter (Signed)
Patient notified

## 2022-11-23 NOTE — Telephone Encounter (Signed)
Orders are in. She does not have to be fasting for these.

## 2022-11-23 NOTE — Telephone Encounter (Signed)
Please place lab orders for lab appt on 7/18

## 2022-11-25 ENCOUNTER — Other Ambulatory Visit: Payer: Medicare Other

## 2022-11-25 DIAGNOSIS — Z932 Ileostomy status: Secondary | ICD-10-CM

## 2022-11-25 DIAGNOSIS — C561 Malignant neoplasm of right ovary: Secondary | ICD-10-CM

## 2022-11-25 DIAGNOSIS — N1831 Chronic kidney disease, stage 3a: Secondary | ICD-10-CM

## 2022-11-25 DIAGNOSIS — E890 Postprocedural hypothyroidism: Secondary | ICD-10-CM

## 2022-11-26 LAB — CMP14+EGFR
ALT: 11 IU/L (ref 0–32)
AST: 16 IU/L (ref 0–40)
Albumin: 4.2 g/dL (ref 3.8–4.8)
Alkaline Phosphatase: 65 IU/L (ref 44–121)
BUN/Creatinine Ratio: 15 (ref 12–28)
BUN: 21 mg/dL (ref 8–27)
Bilirubin Total: 0.5 mg/dL (ref 0.0–1.2)
CO2: 21 mmol/L (ref 20–29)
Calcium: 9.5 mg/dL (ref 8.7–10.3)
Chloride: 98 mmol/L (ref 96–106)
Creatinine, Ser: 1.41 mg/dL — ABNORMAL HIGH (ref 0.57–1.00)
Globulin, Total: 2.3 g/dL (ref 1.5–4.5)
Glucose: 97 mg/dL (ref 70–99)
Potassium: 4.3 mmol/L (ref 3.5–5.2)
Sodium: 135 mmol/L (ref 134–144)
Total Protein: 6.5 g/dL (ref 6.0–8.5)
eGFR: 38 mL/min/{1.73_m2} — ABNORMAL LOW (ref >59)

## 2022-11-26 LAB — CBC
Hematocrit: 33 % — ABNORMAL LOW (ref 34.0–46.6)
Hemoglobin: 11 g/dL — ABNORMAL LOW (ref 11.1–15.9)
MCH: 32.6 pg (ref 26.6–33.0)
MCHC: 33.3 g/dL (ref 31.5–35.7)
MCV: 98 fL — ABNORMAL HIGH (ref 79–97)
Platelets: 198 10*3/uL (ref 150–450)
RBC: 3.37 x10E6/uL — ABNORMAL LOW (ref 3.77–5.28)
RDW: 14.7 % (ref 11.7–15.4)
WBC: 6.2 10*3/uL (ref 3.4–10.8)

## 2022-11-26 LAB — TSH: TSH: 1.5 u[IU]/mL (ref 0.450–4.500)

## 2022-11-26 LAB — IRON,TIBC AND FERRITIN PANEL
Ferritin: 384 ng/mL — ABNORMAL HIGH (ref 15–150)
Iron Saturation: 34 % (ref 15–55)
Iron: 91 ug/dL (ref 27–139)
Total Iron Binding Capacity: 268 ug/dL (ref 250–450)
UIBC: 177 ug/dL (ref 118–369)

## 2022-11-26 LAB — T4, FREE: Free T4: 1.45 ng/dL (ref 0.82–1.77)

## 2022-11-26 LAB — VITAMIN B12: Vitamin B-12: 641 pg/mL (ref 232–1245)

## 2022-11-29 ENCOUNTER — Inpatient Hospital Stay: Payer: Medicare Other | Admitting: Hematology and Oncology

## 2022-11-29 ENCOUNTER — Other Ambulatory Visit: Payer: Self-pay

## 2022-11-29 ENCOUNTER — Inpatient Hospital Stay: Payer: Medicare Other | Attending: Gynecologic Oncology

## 2022-11-29 ENCOUNTER — Encounter: Payer: Self-pay | Admitting: Hematology and Oncology

## 2022-11-29 ENCOUNTER — Ambulatory Visit: Payer: Self-pay | Admitting: *Deleted

## 2022-11-29 VITALS — BP 126/67 | HR 75 | Temp 97.9°F | Resp 18 | Ht 66.0 in | Wt 144.6 lb

## 2022-11-29 DIAGNOSIS — Z79899 Other long term (current) drug therapy: Secondary | ICD-10-CM | POA: Insufficient documentation

## 2022-11-29 DIAGNOSIS — D6481 Anemia due to antineoplastic chemotherapy: Secondary | ICD-10-CM | POA: Diagnosis not present

## 2022-11-29 DIAGNOSIS — C561 Malignant neoplasm of right ovary: Secondary | ICD-10-CM | POA: Insufficient documentation

## 2022-11-29 DIAGNOSIS — N1831 Chronic kidney disease, stage 3a: Secondary | ICD-10-CM | POA: Diagnosis not present

## 2022-11-29 DIAGNOSIS — T451X5A Adverse effect of antineoplastic and immunosuppressive drugs, initial encounter: Secondary | ICD-10-CM | POA: Diagnosis not present

## 2022-11-29 DIAGNOSIS — R5381 Other malaise: Secondary | ICD-10-CM | POA: Insufficient documentation

## 2022-11-29 LAB — CBC WITH DIFFERENTIAL/PLATELET
Abs Immature Granulocytes: 0.02 10*3/uL (ref 0.00–0.07)
Basophils Absolute: 0 10*3/uL (ref 0.0–0.1)
Basophils Relative: 0 %
Eosinophils Absolute: 0.1 10*3/uL (ref 0.0–0.5)
Eosinophils Relative: 2 %
HCT: 32.9 % — ABNORMAL LOW (ref 36.0–46.0)
Hemoglobin: 11.5 g/dL — ABNORMAL LOW (ref 12.0–15.0)
Immature Granulocytes: 0 %
Lymphocytes Relative: 9 %
Lymphs Abs: 0.6 10*3/uL — ABNORMAL LOW (ref 0.7–4.0)
MCH: 32.9 pg (ref 26.0–34.0)
MCHC: 35 g/dL (ref 30.0–36.0)
MCV: 94 fL (ref 80.0–100.0)
Monocytes Absolute: 0.5 10*3/uL (ref 0.1–1.0)
Monocytes Relative: 8 %
Neutro Abs: 5.5 10*3/uL (ref 1.7–7.7)
Neutrophils Relative %: 81 %
Platelets: 189 10*3/uL (ref 150–400)
RBC: 3.5 MIL/uL — ABNORMAL LOW (ref 3.87–5.11)
RDW: 14.4 % (ref 11.5–15.5)
WBC: 6.7 10*3/uL (ref 4.0–10.5)
nRBC: 0 % (ref 0.0–0.2)

## 2022-11-29 LAB — COMPREHENSIVE METABOLIC PANEL
ALT: 12 U/L (ref 0–44)
AST: 16 U/L (ref 15–41)
Albumin: 4.1 g/dL (ref 3.5–5.0)
Alkaline Phosphatase: 51 U/L (ref 38–126)
Anion gap: 7 (ref 5–15)
BUN: 19 mg/dL (ref 8–23)
CO2: 27 mmol/L (ref 22–32)
Calcium: 9.6 mg/dL (ref 8.9–10.3)
Chloride: 102 mmol/L (ref 98–111)
Creatinine, Ser: 1.36 mg/dL — ABNORMAL HIGH (ref 0.44–1.00)
GFR, Estimated: 40 mL/min — ABNORMAL LOW (ref >60)
Glucose, Bld: 72 mg/dL (ref 70–99)
Potassium: 3.6 mmol/L (ref 3.5–5.1)
Sodium: 136 mmol/L (ref 135–145)
Total Bilirubin: 0.5 mg/dL (ref 0.3–1.2)
Total Protein: 6.6 g/dL (ref 6.5–8.1)

## 2022-11-29 NOTE — Patient Instructions (Addendum)
Visit Information  Thank you for taking time to visit with me today. Please don't hesitate to contact me if I can be of assistance to you.   The oncology dietitian is Noreene Larsson. An email was sent to her requesting she outreach to you. Her email address is [email or web address]  The Biomedical engineer is    Following are the goals we discussed today:   Goals Addressed             This Visit's Progress    [facility] care coordination services   On track    Interventions Today    Flowsheet Row Most Recent Value  Chronic Disease   Chronic disease during today's visit Other  General Interventions   General Interventions Discussed/Reviewed General Interventions Reviewed, Communication with  Doctor Visits Discussed/Reviewed Doctor Visits Reviewed, PCP, Specialist  PCP/Specialist Visits Compliance with follow-up visit  Communication with --  [oncology dietitian, Rae Roam via email - Can patient have cheerios?]  Exercise Interventions   Exercise Discussed/Reviewed Physical Activity  Physical Activity Discussed/Reviewed Physical Activity Reviewed, Home Exercise Program (HEP)  Education Interventions   Education Provided Provided Education  [cheerios ? glyphosate]  Provided Verbal Education On Nutrition, Labs  Mental Health Interventions   Mental Health Discussed/Reviewed Mental Health Reviewed  Nutrition Interventions   Nutrition Discussed/Reviewed Nutrition Reviewed, Fluid intake  [cheerios, food to eat after reversal]              Our next appointment is by telephone on 01/18/23 at 330 pm  Please call the care guide team at [phone] if you need to cancel or reschedule your appointment.   If you are experiencing a Mental Health or Behavioral Health Crisis or need someone to talk to, please call the Suicide and Crisis Lifeline: 988 call the [place] National Suicide Prevention Lifeline: 513-770-6632 or TTY: 207-024-9880 TTY 928-826-1978)  to talk to a trained counselor call 1-800-273-TALK (toll free, 24 hour hotline) call the [facility]: (667)723-8398 call 911   Patient verbalizes understanding of instructions and care plan provided today and agrees to view in MyChart. Active MyChart status and patient understanding of how to access instructions and care plan via MyChart confirmed with patient.     The patient has been provided with contact information for the care management team and has been advised to call with any health related questions or concerns.    Twala Collings L. Noelle Penner, RN, BSN, CCM [facility] Care Management Community Coordinator Office number [phone]

## 2022-11-29 NOTE — Assessment & Plan Note (Signed)
She tolerated olaparib well Some of her symptoms are not related to side effects of treatment With plan for her upcoming surgery, she will start olaparib on August 21 in anticipation for major abdominal surgery on September 3 I plan to see her back on September 13 with repeat blood work and exam If her counts recover by then after surgery, she can resume olaparib

## 2022-11-29 NOTE — Assessment & Plan Note (Signed)
This is likely anemia of chronic disease. The patient denies recent history of bleeding such as epistaxis, hematuria or hematochezia. She is asymptomatic from the anemia. We will observe for now.   Overall, her hemoglobin is improving This is not the cause of her weakness or shortness of breath

## 2022-11-29 NOTE — Progress Notes (Signed)
[facility] OFFICE PROGRESS NOTE  [name], DO as PCP - General (Family Medicine) [name], MD as Attending Physician (Obstetrics and Gynecology) [name], MD as Consulting Physician (Family Medicine) [name], OD (Optometry) [name], RN as [facility] Care Management Care, [facility] Services) [name], MD as Consulting Physician (Hematology and Oncology) [name], [name], MD as Consulting Physician (General Surgery)  ASSESSMENT & PLAN:  Right ovarian epithelial cancer [facility]) She tolerated olaparib well Some of her symptoms are not related to side effects of treatment With plan for her upcoming surgery, she will start olaparib on August 21 in anticipation for major abdominal surgery on September 3 I plan to see her back on September 13 with repeat blood work and exam If her counts recover by then after surgery, she can resume olaparib  Anemia due to antineoplastic chemotherapy This is likely anemia of chronic disease. The patient denies recent history of bleeding such as epistaxis, hematuria or hematochezia. She is asymptomatic from the anemia. We will observe for now.   Overall, her hemoglobin is improving This is not the cause of her weakness or shortness of breath  Stage 3a chronic kidney disease (HCC) She is prone to get dehydrated I recommend frequent oral fluid intake Overall, her renal function is stable  Physical debility Her with generalized weakness, shortness of breath on exertion, fatigue are likely due to the generalized deconditioning from recent chemotherapy, recurrent hospitalizations and others I completed application for temporary disability parking permit for her  No orders of the defined types were placed in this encounter.   All questions were answered. The patient knows to call the clinic with any problems, questions or concerns. The total  time spent in the appointment was 30 minutes encounter with patients including review of chart and various tests results, discussions about plan of care and coordination of care plan   [name], MD 11/29/2022 8:39 AM  INTERVAL HISTORY: Please see below for problem oriented charting. she returns for treatment follow-up on olaparib She is doing well except for some intermittent lack of appetite, occasional headaches, weakness and shortness of breath on exertion  REVIEW OF SYSTEMS:   Constitutional: Denies fevers, chills or abnormal weight loss Eyes: Denies blurriness of vision Ears, nose, mouth, throat, and face: Denies mucositis or sore throat Respiratory: Denies cough, dyspnea or wheezes Cardiovascular: Denies palpitation, chest discomfort or lower extremity swelling Gastrointestinal:  Denies nausea, heartburn or change in bowel habits Skin: Denies abnormal skin rashes Lymphatics: Denies new lymphadenopathy or easy bruising Neurological:Denies numbness, tingling or new weaknesses Behavioral/Psych: Mood is stable, no new changes  All other systems were reviewed with the patient and are negative.  I have reviewed the past medical history, past surgical history, social history and family history with the patient and they are unchanged from previous note.  ALLERGIES:  is allergic to paclitaxel, emend [fosaprepitant dimeglumine], codeine, nsaids, penicillins, vibra-tab [doxycycline], iodine, and latex.  MEDICATIONS:  Current Outpatient Medications  Medication Sig Dispense Refill   acetaminophen (TYLENOL) 500 MG tablet Take 2 tablets (1,000 mg total) by mouth every 6 (six) hours as needed for mild pain. 30 tablet 0   amLODipine (NORVASC) 5 MG tablet Take 5 mg by mouth daily. (Patient not taking: Reported on 08/27/2022)     aspirin EC 81 MG tablet Take 81 mg by mouth at bedtime. Swallow whole.     atenolol (TENORMIN) 25 MG tablet Take 0.5  tablets (12.5 mg total) by mouth 2 (two) times  daily. 90 tablet 3   Calcium Carb-Cholecalciferol (CALCIUM + VITAMIN D3 PO) Take 1 tablet by mouth 2 (two) times daily.     EPINEPHrine 0.3 mg/0.3 mL IJ SOAJ injection Inject 0.3 mg into the muscle as needed for anaphylaxis. 2 each 2   levothyroxine (SYNTHROID) 100 MCG tablet Take 1 tablet (100 mcg total) by mouth in the morning. 90 tablet 3   Multiple Vitamin (MULTIVITAMIN) tablet Take 1 tablet by mouth in the morning.     olaparib (LYNPARZA) 100 MG tablet Take 1 tablet (100 mg total) by mouth 2 (two) times daily. Swallow whole. May take with food to decrease nausea and vomiting. (Patient not taking: Reported on 08/27/2022) 60 tablet 11   ondansetron (ZOFRAN) 4 MG tablet Take 1 tablet (4 mg total) by mouth every 6 (six) hours as needed for nausea. 20 tablet 0   simvastatin (ZOCOR) 20 MG tablet Take 20 mg by mouth daily.     No current facility-administered medications for this visit.    SUMMARY OF ONCOLOGIC HISTORY: Oncology History Overview Note  High grade papillary serous 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   Right ovarian epithelial cancer (HCC)  03/16/2022 Imaging   1. Large, mixed solid and cystic mass in the low pelvis, difficult to clearly delineate from adjacent ascites although at least 10.2 x 10.0 cm. This is highly concerning for primary ovarian malignancy and most likely arises from the right ovary, although may involve both ovaries. This may be further evaluated by contrast enhanced MRI of the pelvis if desired. 2. Large volume ascites throughout the abdomen and pelvis, presumed malignant. Stranding and nodularity of the omentum and peritoneum, particularly in the ventral abdomen and left upper quadrant, highly suspicious for peritoneal metastatic disease. 3. Cirrhotic morphology of the liver, which may contribute to ascites. 4. Severely atrophic left kidney, consistent with remote prior infectious, obstructive, or ischemic insult. 5. Coronary artery disease.    Aortic Atherosclerosis (ICD10-I70.0).   03/17/2022 Imaging   1. There is a large solid and cystic mass within the right adnexa which measures up to 9.5 cm in maximum dimension. Findings are concerning for a primary ovarian neoplasm. 2. Large volume of ascites within the abdomen and pelvis. Given the presence of peritoneal nodules on the CT from the prior day findings are concerning for malignant ascites.   03/19/2022 Procedure   CT-guided biopsy of omental mass.    03/19/2022 Procedure   Successful ultrasound-guided paracentesis yielding 4.2 liters of peritoneal fluid.   03/19/2022 Procedure   Successful placement of a left internal jugular approach power injectable Port-A-Cath. The catheter is ready for immediate use.   03/30/2022 Initial Diagnosis   Ovarian cancer (HCC)   03/30/2022 Pathology Results   A. RIGHT OVARY AND FALLOPIAN TUBE, SALPINGO OOPHORECTOMY: Poorly differentiated carcinoma immunohistochemically most consistent with a high grade serous carcinoma Tumor measures 10.7 x 10.6 x 4.6 cm Tumor involves ovarian and mesosalpinx serosal surfaces and right fallopian tube pT2a pN n/a  Note: The tumor consists of sheets and cords of high-grade nuclei with areas of necrosis and adjacent prominent stroma suggesting papillary cores the overall morphology is slightly unusual and not classic; therefore, a battery of immunohistochemical stains were performed to confirm the diagnosis and exclude a high-grade germ cell tumor.  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 Additionally there is  nonspecific focal PLAP positivity; CD117 positivity and D2-40 positivity of uncertain clinical significance.  The tumor is negative for AFP, hCG, CD30, CD31, CK20, glypican-3, and OCT 3/4.  Dr. Luisa Hart has peer reviewed the case and agrees with diagnosis. Dr. Venetia Night has also initiated the workup  of the case and agrees with the interpretation.  B. OMENTUM, RESECTION: Benign omentum Negative for tumor  C. RIGHT PELVIC SIDEWALL, BIOPSY: Benign mesothelium, fibrovascular stroma and adipose Negative for tumor  D. ANTERIOR CUL DE SAC, BIOPSY: Benign mesothelium Negative for tumor  E. LEFT PELVIC SIDEWALL, BIOPSY: Benign mesothelium and adipose Negative for tumor   ONCOLOGY TABLE:  OVARY or FALLOPIAN TUBE or PRIMARY PERITONEUM: Resection  Procedure: Right salpingo-oophorectomy Specimen Integrity: Grossly focally disrupted Tumor Site: Right ovary Tumor Size: 10.7 x 10.6 x 4.6 cm Histologic Type: Poorly differentiated carcinoma most consistent with a high-grade serous carcinoma Histologic Grade: High-grade Ovarian Surface Involvement: Surface involvement Fallopian Tube Surface Involvement: Mesosalpinx serosal surface involvement and right fallopian tube involvement Implants (required for advanced stage serous/seromucinous borderline tumors only): Not identified Other Tissue/ Organ Involvement: N/A Largest Extrapelvic Peritoneal Focus: N/A Peritoneal/Ascitic Fluid Involvement: N/A Chemotherapy Response Score (CRS): N/A, no known presurgical therapy Regional Lymph Nodes: N/A, no lymph nodes submitted Distant Metastasis:      Distant Site(s) Involved: N/A Pathologic Stage Classification (pTNM, AJCC 8th Edition): pT2a, pN N/A Ancillary Studies: Can be performed on physician request Representative Tumor Block: A8 Comment(s): [None]    03/30/2022 Surgery   Preop Diagnosis: Pelvic mass, malignant ascites   Postoperative Diagnosis: at least stage IIB ovarian cancer, suspected high grade serous by frozen section   Surgery: Diagnostic laparoscopy, right salpingo-oophorectomy, peritoneal biopsies, infra-colic omentectomy    Surgeon:  Carin Hock MD    Operative findings: On exam under anesthesia, somewhat mobile mass appreciated in the cul-de-sac to the right.  On agnostic  laparoscopy, normal upper abdominal findings other than moderate volume ascites.  Normal-appearing peritoneum, omentum, small bowel.  Right adnexa enlarged with a mass measuring approximately 8-10 cm.  No carcinomatosis appreciated.  Given this, decision made to convert to an open procedure for tumor debulking.  Approximately 5 L of yellow-tinged ascites was removed upon intra-abdominal entry.  Normal upper abdominal survey including smooth diaphragm and liver, stomach, and omentum.  The small bowel was run from the cecum to the ligament of Treitz with no abnormalities or carcinomatosis noted.  Right ovary replaced by an approximately 8 cm smooth mass, dilated and enlarged fallopian tube.  The mass itself was adherent to the right pelvic sidewall, sigmoid colon, and densely adherent to the anterior cul-de-sac/bladder peritoneum.  Frozen section consistent with invasive carcinoma, likely high-grade.  Left tube and ovary were surgically absent.  Sigmoid colon with adhesions to the left pelvic sidewall, left aspect of the bladder and the left vaginal cuff.  Somewhat redundant sigmoid which looped over on itself secondary to these adhesions.  Numerous diverticula noted of the sigmoid colon as well as the transverse colon.  The end of surgery, an R0 resection was accomplished.     04/13/2022 Cancer Staging   Staging form: Ovary, Fallopian Tube, and Primary Peritoneal Carcinoma, AJCC 8th Edition - Pathologic stage from 04/13/2022: Stage II (pT2, pN0, cM0) - Signed by [name], MD on 04/13/2022 Stage prefix: Initial diagnosis   04/26/2022 - 08/10/2022 Chemotherapy   Patient is on Treatment Plan : OVARIAN Carboplatin (AUC 6) + Paclitaxel (175) q21d X 6 Cycles     05/17/2022 Tumor Marker   Patient's  tumor was tested for the following markers: CA-125. Results of the tumor marker test revealed 12.7.   06/06/2022 Genetic Testing   Germline: Negative [facility] Multi-Cancer +RNA Panel.  Report date is 06/06/2022.   The  Multi-Cancer + RNA Panel offered by [facility] includes sequencing and/or deletion/duplication analysis of the following 70 genes:  AIP*, ALK, APC*, ATM*, AXIN2*, BAP1*, BARD1*, BLM*, BMPR1A*, BRCA1*, BRCA2*, BRIP1*, CDC73*, CDH1*, CDK4, CDKN1B*, CDKN2A, CHEK2*, CTNNA1*, DICER1*, EPCAM (del/dup only), EGFR, FH*, FLCN*, GREM1 (promoter dup only), HOXB13, KIT, LZTR1, MAX*, MBD4, MEN1*, MET, MITF, MLH1*, MSH2*, MSH3*, MSH6*, MUTYH*, NF1*, NF2*, NTHL1*, PALB2*, PDGFRA, PMS2*, POLD1*, POLE*, POT1*, PRKAR1A*, PTCH1*, PTEN*, RAD51C*, RAD51D*, RB1*, RET, SDHA* (sequencing only), SDHAF2*, SDHB*, SDHC*, SDHD*, SMAD4*, SMARCA4*, SMARCB1*, SMARCE1*, STK11*, SUFU*, TMEM127*, TP53*, TSC1*, TSC2*, VHL*. RNA analysis is performed for * genes.  Somatic: Positive genomic instability score (GIS) of 62 through [facility].  No pathogenic variants detected in BRCA1/2.  Report date is June 10, 2022.   [facility] CDx includes sequencing and large rearrangement analysis of BRCA1/2 and genomic instability status through loss of heterozygosity (LOH), telomeric allelic imbalance (TAI) and large-scale state transitions (LST).     06/08/2022 Tumor Marker   Patient's tumor was tested for the following markers: Ca-125. Results of the tumor marker test revealed 16.5.   06/13/2022 - 06/17/2022 Hospital Admission   The patient was admitted to the hospital and found to have diarrhea, diverticulitis and pancytopenia   06/13/2022 Imaging   Colonic diverticulosis. Inflammatory stranding around the mid sigmoid colon compatible with active diverticulitis.   Interval resection of the previously seen lower pelvic solid and cystic mass and resolution of ascites.   Aortic atherosclerosis.   Severely atrophic left kidney, stable since prior study.   06/30/2022 Tumor Marker   Patient's tumor was tested for the following markers: CA-125. Results of the tumor marker test revealed 15.   07/21/2022 Tumor Marker   Patient's tumor was  tested for the following markers: CA-125. Results of the tumor marker test revealed 11.4.   08/12/2022 Tumor Marker   Patient's tumor was tested for the following markers: CA-125. Results of the tumor marker test revealed 9.7.   08/14/2022 Imaging   1. Wall thickening of the sigmoid colon with adjacent inflammatory change, overall improved when compared with prior exam. New focal collection of gas involving the wall of the sigmoid colon which extends inferiorly to the area of the left vaginal cuff with contrast material seen within the vaginal cuff. 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 2. Wall thickening of the left lateral wall of the urinary bladder with air seen within the urinary bladder, findings raise concern for additional area of fistulization to the bladder. CT cystogram could be performed for better evaluation. 3. New pelviectasis and mild dilation of the left ureter, likely secondary to inflammatory change at the area of the left ureterovesicular junction. Chronic atrophy of the left kidney. 4. Increased wall thickening of a large colonic diverticulum of the proximal transverse colon, likely due to diverticulitis. 5. Aortic Atherosclerosis (ICD10-I70.0).     08/23/2022 Imaging   1. Increased large volume gas within the bladder. This raises concern for colovesical fistula. Consider CT cystogram for further evaluation. 2. Similar to slightly decreased inflammatory changes surrounding the sigmoid colon. The previous collection of gas involving the wall of the sigmoid colon has decreased. There is a small amount of residual gas in the area of the left vaginal cuff. There is again a soft tissue tract extending from  the vaginal cuff to the sigmoid colon, though no gas is seen within this tract. 3. Decreased size and wall thickening about the large diverticulum in the proximal transverse colon. 4. Slight decrease in pelviectasis and dilation of the left ureter.   Aortic  Atherosclerosis (ICD10-I70.0).     08/27/2022 Imaging   1. Continued sigmoid wall thickening and inflammatory reaction with diverticulosis with increased inflammation and layering reactive pelvic fluid since April 15. Further evaluation to exclude underlying colonic lesion is recommended when clinically feasible. 2. There is now a demonstrable fistulous tract from the sigmoid colon into the left dorsal bladder wall, with air and fluid in the bladder and patchy enteric contrast now settling in the dependent bladder. 3. The known left-sided colovaginal fistula is not well seen today but there is contrast in the vagina consistent with continued tract patency. 4. There is now moderate bilateral hydroureteronephrosis into the pelvis where the ureters are difficult to follow due to the pre-existing inflammatory process, with ureteral tethering by inflammatory process most likely causing the obstructive uropathy. No stone disease is seen. 5. Severe chronic atrophy of the left kidney. 6. Aortic and coronary artery atherosclerosis. 7. Anemia. 8. Prominent common bile duct at 12 mm, unchanged. 9. Constipation. 10. Osteopenia, scoliosis, and advanced degenerative change of the lumbar spine with acquired spinal stenosis L4-5.   09/01/2022 Surgery   Preoperative diagnosis: colovesical fistula   Postoperative diagnosis: same    Procedure: laparoscopic converted to open sigmoid resection with anastomosis, diverting loop ileostomy, take down of colovesical fistula   Surgeon: Feliciana Rossetti, M.D.   Asst: Carl Best, [facility]   Anesthesia: GETA   Indications for procedure: April Weber is a 77 y.o. year old female with symptoms of recurrent UTIs and abdominal pain.   Description of procedure: The patient was brought into the operative suite. Anesthesia was administered with General endotracheal anesthesia. WHO checklist was applied. The patient was then placed in lithotomy position. The area was  prepped and draped in the usual sterile fashion.   Dr. Laverle Patter performed a cysto and plans ureteral stents prior to surgery.   Next, a left subcostal incision was made. A 5mm trocar was used to gain access to the peritoneal cavity by optical entry technique. Pneumoperitoneum was applied with a high flow and low pressure. The laparoscope was reinserted to confirm position.   3 additional trocars were placed 1 5 mm trocar in the right upper abdomen, 1 5 mm trocar in the infraumbilical space, and 1 12 mm trocar in the right lower quadrant.   There were no adhesions to the abdominal wall. The sigmoid colon was thickened and chronically inflamed. There was one loop of small intestine adhesed to the sigmoid colon, this was sharply dissected free. The White line of Toldt was incised. Blunt dissection was used to free up the colon from the bladder and abdominal wall. This was very difficult. During this dissection Dr. Melynda Keller came in to look at the abdomen and determined no evidence of cancer was present. After about 30 minutes of laparoscopic dissection, I did not think I could safely complete dissection laparoscopically and so a lower midline incision was made.   Next, palpation identified the ureters which were well away from the dissection. Blunt dissection was able to free the colon completely from the bladder and side wall. Next, a blue load contour stapler was used to divide the distal sigmoid colon. The mesentery was taken with ligasure device. The left colon was fully mobilized a  portion of colon proximal to the inflammatory changes was purse stringed and divided distally. A 29 mm anvil was placed into the colon and purse string tied down.    Next, Dr. Laverle Patter came in to repair the bladder. See his dictation for more information.   The rectum was mobilized more by freeing it from the side walls. At this point I went below to palpate the rectum, there was some difficulty to get the dilator to reach the  staple line. There was an attempt to pass the stapler but this led to a tear in the distal rectum. Dr. Michaell Cowing came in at this time to assist and a further margin of sigmoid/rectum was taken with the contour stapler. This allowed the stapler to come to the end and anastomosis was performed. Leak test showed a small leak distal to the staple line that was repaired with interrupted 3-0 vicryl. Due to this leak and thin tissue at the area, decision was made to divert with loop ileostomy.   The 12 mm trocar site was enlarged and a portion of distal ileum brought through the hole. A 19 fr blake drain was brought through RUQ incision and tip placed into the pelvis. The abdomen was irrigated. Hemostasis was intact. The peritoneum was closed with 2-0 vicryl. The fascia was closed with 0 PDS. The ileostomy was matured with 3-0 and 2-3 cm of Brooking. The skin was closed with staples. Ileostomy bag was placed. All counts were correct.   Findings: colovesical fistula, dense inflammation of the sigmoid colon   09/01/2022 Surgery   Preoperative diagnosis: 1.  Colovesical fistula 2.  Atrophic left kidney 3.  Right hydronephrosis   Postoperative diagnosis: 1.  Colovesical fistula 2.  Atrophic left kidney 3.  Right hydronephrosis   Procedures: 1.  Cystoscopy 2.  Bilateral retrograde pyelography with interpretation 3.  Bilateral ureteral stent placement (right -6 x 24, left -6 x 26) 4.  Repair of bladder fistula   10/01/2022 Tumor Marker   Patient's tumor was tested for the following markers: CA-125. Results of the tumor marker test revealed 14.   10/11/2022 -  Chemotherapy   She started taking olaparib   10/25/2022 Tumor Marker   Patient's tumor was tested for the following markers: CA-125. Results of the tumor marker test revealed 10.6.   11/15/2022 Procedure   Successful removal of implanted Port-A-Cath.      PHYSICAL EXAMINATION: ECOG PERFORMANCE STATUS: 1 - Symptomatic but completely  ambulatory  Vitals:   11/29/22 0822  BP: 126/67  Pulse: 75  Resp: 18  Temp: 97.9 F (36.6 C)  SpO2: 100%   Filed Weights   11/29/22 0822  Weight: 144 lb 9.6 oz (65.6 kg)    GENERAL:alert, no distress and comfortable NEURO: alert & oriented x 3 with fluent speech, no focal motor/sensory deficits  LABORATORY DATA:  I have reviewed the data as listed    Component Value Date/Time   NA 136 11/29/2022 0749   NA 135 11/25/2022 0935   K 3.6 11/29/2022 0749   CL 102 11/29/2022 0749   CO2 27 11/29/2022 0749   GLUCOSE 72 11/29/2022 0749   BUN 19 11/29/2022 0749   BUN 21 11/25/2022 0935   CREATININE 1.36 (H) 11/29/2022 0749   CREATININE 0.93 08/10/2022 0857   CREATININE 0.99 02/05/2013 0757   CALCIUM 9.6 11/29/2022 0749   PROT 6.6 11/29/2022 0749   PROT 6.5 11/25/2022 0935   ALBUMIN 4.1 11/29/2022 0749   ALBUMIN 4.2 11/25/2022 0935  AST 16 11/29/2022 0749   AST 12 (L) 08/10/2022 0857   ALT 12 11/29/2022 0749   ALT 11 08/10/2022 0857   ALKPHOS 51 11/29/2022 0749   BILITOT 0.5 11/29/2022 0749   BILITOT 0.5 11/25/2022 0935   BILITOT 0.3 08/10/2022 0857   GFRNONAA 40 (L) 11/29/2022 0749   GFRNONAA >60 08/10/2022 0857   GFRNONAA 59 (L) 02/05/2013 0757   GFRAA 61 05/20/2020 0850   GFRAA 68 02/05/2013 0757    No results found for: "SPEP", "UPEP"  Lab Results  Component Value Date   WBC 6.7 11/29/2022   NEUTROABS 5.5 11/29/2022   HGB 11.5 (L) 11/29/2022   HCT 32.9 (L) 11/29/2022   MCV 94.0 11/29/2022   PLT 189 11/29/2022      Chemistry      Component Value Date/Time   NA 136 11/29/2022 0749   NA 135 11/25/2022 0935   K 3.6 11/29/2022 0749   CL 102 11/29/2022 0749   CO2 27 11/29/2022 0749   BUN 19 11/29/2022 0749   BUN 21 11/25/2022 0935   CREATININE 1.36 (H) 11/29/2022 0749   CREATININE 0.93 08/10/2022 0857   CREATININE 0.99 02/05/2013 0757      Component Value Date/Time   CALCIUM 9.6 11/29/2022 0749   ALKPHOS 51 11/29/2022 0749   AST 16 11/29/2022 0749    AST 12 (L) 08/10/2022 0857   ALT 12 11/29/2022 0749   ALT 11 08/10/2022 0857   BILITOT 0.5 11/29/2022 0749   BILITOT 0.5 11/25/2022 0935   BILITOT 0.3 08/10/2022 0857

## 2022-11-29 NOTE — Assessment & Plan Note (Signed)
She is prone to get dehydrated I recommend frequent oral fluid intake Overall, her renal function is stable

## 2022-11-29 NOTE — Patient Outreach (Signed)
  Care Coordination   Follow Up Visit Note   11/30/2022 Name: BRESLIN BURKLOW MRN: [id number] DOB: 04/19/1946  TACHE BOBST is a 77 y.o. year old female who sees Raliegh Ip, DO for primary care. I spoke with  [name] by phone today.  What matters to the patients health and wellness today?  Pending ileostomy on 01/11/23, diet- can have cheerios Saw oncology this morning  [phone] [facility] Tressia Danas left 09/05/22 Reports having "glitch" on my right side if I move too quick "I don't know if this is a problem Confirmed she voiced the understanding of not taking cheerios that has glyphosate   Goals Addressed             This Visit's Progress    [facility] care coordination services   On track    Interventions Today    Flowsheet Row Most Recent Value  Chronic Disease   Chronic disease during today's visit Other  General Interventions   General Interventions Discussed/Reviewed General Interventions Reviewed, Communication with  Doctor Visits Discussed/Reviewed Doctor Visits Reviewed, PCP, Specialist  PCP/Specialist Visits Compliance with follow-up visit  Communication with --  [oncology dietitian, Rae Roam via email - Can patient have cheerios?]  Exercise Interventions   Exercise Discussed/Reviewed Physical Activity  Physical Activity Discussed/Reviewed Physical Activity Reviewed, Home Exercise Program (HEP)  Education Interventions   Education Provided Provided Education  [cheerios ? glyphosate]  Provided Verbal Education On Nutrition, Labs  Mental Health Interventions   Mental Health Discussed/Reviewed Mental Health Reviewed  Nutrition Interventions   Nutrition Discussed/Reviewed Nutrition Reviewed, Fluid intake  [cheerios, food to eat after reversal]              SDOH assessments and interventions completed:  No     Care Coordination Interventions:  Yes, provided   Follow up plan: Follow up call scheduled for 01/18/23     Encounter Outcome:  Pt. Visit Completed    Jaevon Paras L. Noelle Penner, RN, BSN, CCM [facility] Care Management Community Coordinator Office number [phone]

## 2022-11-29 NOTE — Assessment & Plan Note (Signed)
Her with generalized weakness, shortness of breath on exertion, fatigue are likely due to the generalized deconditioning from recent chemotherapy, recurrent hospitalizations and others I completed application for temporary disability parking permit for her

## 2022-11-30 ENCOUNTER — Ambulatory Visit (INDEPENDENT_AMBULATORY_CARE_PROVIDER_SITE_OTHER): Payer: Medicare Other | Admitting: Family Medicine

## 2022-11-30 ENCOUNTER — Other Ambulatory Visit: Payer: Medicare Other

## 2022-11-30 ENCOUNTER — Encounter: Payer: Self-pay | Admitting: Family Medicine

## 2022-11-30 VITALS — BP 120/79 | HR 64 | Temp 98.6°F | Ht 66.0 in | Wt 143.0 lb

## 2022-11-30 DIAGNOSIS — I129 Hypertensive chronic kidney disease with stage 1 through stage 4 chronic kidney disease, or unspecified chronic kidney disease: Secondary | ICD-10-CM | POA: Diagnosis not present

## 2022-11-30 DIAGNOSIS — Z932 Ileostomy status: Secondary | ICD-10-CM

## 2022-11-30 DIAGNOSIS — M8580 Other specified disorders of bone density and structure, unspecified site: Secondary | ICD-10-CM | POA: Diagnosis not present

## 2022-11-30 DIAGNOSIS — C561 Malignant neoplasm of right ovary: Secondary | ICD-10-CM | POA: Diagnosis not present

## 2022-11-30 DIAGNOSIS — E890 Postprocedural hypothyroidism: Secondary | ICD-10-CM

## 2022-11-30 DIAGNOSIS — I10 Essential (primary) hypertension: Secondary | ICD-10-CM

## 2022-11-30 DIAGNOSIS — Z780 Asymptomatic menopausal state: Secondary | ICD-10-CM

## 2022-11-30 DIAGNOSIS — N1832 Chronic kidney disease, stage 3b: Secondary | ICD-10-CM

## 2022-11-30 DIAGNOSIS — Z853 Personal history of malignant neoplasm of breast: Secondary | ICD-10-CM

## 2022-11-30 LAB — CA 125: Cancer Antigen (CA) 125: 8.3 U/mL (ref 0.0–38.1)

## 2022-11-30 MED ORDER — LEVOTHYROXINE SODIUM 100 MCG PO TABS
100.0000 ug | ORAL_TABLET | Freq: Every morning | ORAL | 3 refills | Status: DC
Start: 2022-11-30 — End: 2023-10-18

## 2022-11-30 MED ORDER — AMLODIPINE BESYLATE 5 MG PO TABS
5.0000 mg | ORAL_TABLET | Freq: Every day | ORAL | 3 refills | Status: DC
Start: 2022-11-30 — End: 2023-10-18

## 2022-11-30 MED ORDER — SIMVASTATIN 20 MG PO TABS: 20.0000 mg | ORAL_TABLET | Freq: Every day | ORAL | 3 refills | Status: DC

## 2022-11-30 NOTE — Patient Instructions (Signed)
Dr Wolfgang Phoenix is the kidney specialist [address], [address]; [place], [place] [address]; Phone [phone]; Fax [phone].

## 2022-11-30 NOTE — Progress Notes (Signed)
Subjective: CC: Chronic follow-up PCP: Raliegh Ip, DO [id number] [name] is a 77 y.o. female presenting to clinic today for:  1.  Hypertension associate with CKD 3 Patient reports compliance with medications.  She does not report any chest pain, shortness of breath or edema.  She has had a drop in renal function from CKD 3A to CKD 3B.  She is currently being treated with medications for right-sided ovarian cancer.  Her GFR is being closely monitored by hematology/oncology.  She is under the care of Dr. Bertis Ruddy.  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  She will be having reversal of this loop ileostomy in September with Dr. Sheliah Hatch.  Overall she does feel a bit overwhelmed with all the medical issues that have arisen over the last year.  However she is grateful that she has been able to get treatment.  She is simply eager to get things back to normal and get back to her normal physical activity, eating etc.  Her diet is extremely limited due to the ileostomy.   ROS: Per HPI  Allergies  Allergen Reactions   Paclitaxel Anaphylaxis   Emend [Fosaprepitant Dimeglumine] Other (See Comments)    3 minutes into emend infusion patient began having facial flushing and redness to bilateral hands and neck, and light headedness   Codeine Nausea And Vomiting   Nsaids Other (See Comments)    Told to avoid due to kidney function   Penicillins Hives   Vibra-Tab [Doxycycline] Hives   Iodine Rash   Latex Rash    Occasionally gets a localized rash on contact with certain latex products   Past Medical History:  Diagnosis Date   Arthritis    Breast cancer (HCC) 1980   RIGHT   Chronic kidney disease    has one kidney   DVT (deep venous thrombosis) (HCC)    on RIGHT leg-hx of   Dyspnea    Hyperlipidemia    on meds   Hypertension    on meds   Osteopenia    Peripheral vascular disease (HCC)    PONV (postoperative nausea and vomiting)    Seasonal  allergies    Thyroid disease    on meds    Current Outpatient Medications:    acetaminophen (TYLENOL) 500 MG tablet, Take 2 tablets (1,000 mg total) by mouth every 6 (six) hours as needed for mild pain., Disp: 30 tablet, Rfl: 0   amLODipine (NORVASC) 5 MG tablet, Take 5 mg by mouth daily., Disp: , Rfl:    aspirin EC 81 MG tablet, Take 81 mg by mouth at bedtime. Swallow whole., Disp: , Rfl:    atenolol (TENORMIN) 25 MG tablet, Take 0.5 tablets (12.5 mg total) by mouth 2 (two) times daily., Disp: 90 tablet, Rfl: 3   Calcium Carb-Cholecalciferol (CALCIUM + VITAMIN D3 PO), Take 1 tablet by mouth 2 (two) times daily., Disp: , Rfl:    EPINEPHrine 0.3 mg/0.3 mL IJ SOAJ injection, Inject 0.3 mg into the muscle as needed for anaphylaxis., Disp: 2 each, Rfl: 2   levothyroxine (SYNTHROID) 100 MCG tablet, Take 1 tablet (100 mcg total) by mouth in the morning., Disp: 90 tablet, Rfl: 3   Multiple Vitamin (MULTIVITAMIN) tablet, Take 1 tablet by mouth in the morning., Disp: , Rfl:    olaparib (LYNPARZA) 100 MG tablet, Take 1 tablet (100 mg total) by mouth 2 (two) times daily. Swallow whole. 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  Tobacco Use   Smoking status: Former    Current packs/day: 0.00    Types: Cigarettes    Quit date: 05/11/1967    Years since quitting: 55.5   Smokeless tobacco: Never  Vaping Use   Vaping status: Never Used  Substance  and Sexual Activity   Alcohol use: No   Drug use: No   Sexual activity: Not Currently    Birth control/protection: Post-menopausal, Surgical    Comment: hyst  Other Topics Concern   Not on file  Social History Narrative   Lives alone - very close with her neighbors   Social Determinants of Health   Financial Resource Strain: Low Risk  (09/14/2022)   Overall Financial Resource Strain (CARDIA)    Difficulty of Paying Living Expenses: Not hard at all  Food Insecurity: No Food Insecurity (09/14/2022)   Hunger Vital Sign    Worried About Running Out of Food in the Last Year: Never true    Ran Out of Food in the Last Year: Never true  Transportation Needs: No Transportation Needs (09/14/2022)   PRAPARE - Administrator, Civil Service (Medical): No    Lack of Transportation (Non-Medical): No  Physical Activity: Sufficiently Active (08/26/2021)   Exercise Vital Sign    Days of Exercise per Week: 7 days    Minutes of Exercise per Session: 30 min  Stress: No Stress Concern Present (09/14/2022)   [employer] of Occupational Health - Occupational Stress Questionnaire    Feeling of Stress : Only a little  Social Connections: Moderately Integrated (09/14/2022)   Social Connection and Isolation Panel [NHANES]    Frequency of Communication with Friends and Family: More than three times a week    Frequency of Social Gatherings with Friends and Family: More than three times a week    Attends Religious Services: More than 4 times per year    Active Member of [employer] or Organizations: Yes    Attends Engineer, structural: More than 4 times per year    Marital Status: Divorced  Intimate Partner Violence: Not At Risk (09/14/2022)   Humiliation, Afraid, Rape, and Kick questionnaire    Fear of Current or Ex-Partner: No    Emotionally Abused: No    Physically Abused: No    Sexually Abused: No   Family History  Problem Relation Age of Onset   Heart attack Mother    Alcohol abuse  Mother    Lung cancer Mother        d. mid 98s   Breast cancer Maternal Aunt 60   Colon polyps Neg Hx    Colon cancer Neg Hx    Esophageal cancer Neg Hx    Stomach cancer Neg Hx    Rectal cancer Neg Hx     Objective: Office vital signs reviewed. BP 120/79   Pulse 64   Temp  98.6 F (37 C)   Ht 5\' 6"  (1.676 m)   Wt 143 lb (64.9 kg)   SpO2 98%   BMI 23.08 kg/m   Physical Examination:  General: Awake, alert, appears somewhat tired, No acute distress HEENT: No conjunctival pallor.  Moist mucous membranes. Cardio: regular rate and rhythm, S1S2 heard, no murmurs appreciated Pulm: clear to auscultation bilaterally, no wheezes, rhonchi or rales; normal work of breathing on room air GI: Soft, mild tenderness along the left aspect of the abdomen present.  She has ileostomy in place on the right side of the abdomen with healthy-appearing stoma and brown liquid stool in bag.  Her bowel sounds are little hyperactive after palpation of the abdomen. Psych: Mood stable, speech normal, very pleasant, interactive  Assessment/ Plan: 77 y.o. female   Stage 3b chronic kidney disease (HCC) - Plan: DG [facility] DEXA, Ambulatory referral to Nephrology  Right ovarian epithelial cancer (HCC) - Plan: DG [facility] DEXA  Ileostomy in place (HCC)  Osteopenia with high risk of fracture - Plan: DG [facility] DEXA  History of breast cancer - Plan: DG [facility] DEXA  Asymptomatic postmenopausal estrogen deficiency - Plan: DG [facility] DEXA  Essential hypertension - Plan: amLODipine (NORVASC) 5 MG tablet  Postoperative hypothyroidism - Plan: levothyroxine (SYNTHROID) 100 MCG tablet  I have reviewed her labs from the 18th.  Renal function is consistent with CKD 3B.  Likely no electrolyte abnormalities.  Vitamin B12 within acceptable range after her ileostomy as well as her vitamin D.  She does have some anemia noted and I wonder if this is related to CKD.  I have gone ahead and placed a referral to nephrology for further  assistance.  Will plan for DEXA scan given history of osteopenia, CKD, treatment for ovarian cancer and previously breast cancer.  Thyroid levels appropriate.  Thyroid medication renewed  No orders of the defined types were placed in this encounter.  No orders of the defined types were placed in this encounter.    Raliegh Ip, DO [facility] [phone]

## 2022-12-02 ENCOUNTER — Ambulatory Visit (INDEPENDENT_AMBULATORY_CARE_PROVIDER_SITE_OTHER): Payer: Medicare Other

## 2022-12-02 DIAGNOSIS — Z853 Personal history of malignant neoplasm of breast: Secondary | ICD-10-CM

## 2022-12-02 DIAGNOSIS — M8580 Other specified disorders of bone density and structure, unspecified site: Secondary | ICD-10-CM

## 2022-12-02 DIAGNOSIS — Z780 Asymptomatic menopausal state: Secondary | ICD-10-CM | POA: Diagnosis not present

## 2022-12-02 DIAGNOSIS — N1832 Chronic kidney disease, stage 3b: Secondary | ICD-10-CM

## 2022-12-02 DIAGNOSIS — M8589 Other specified disorders of bone density and structure, multiple sites: Secondary | ICD-10-CM | POA: Diagnosis not present

## 2022-12-02 DIAGNOSIS — C561 Malignant neoplasm of right ovary: Secondary | ICD-10-CM

## 2022-12-03 ENCOUNTER — Ambulatory Visit: Payer: Self-pay | Admitting: *Deleted

## 2022-12-03 NOTE — Patient Outreach (Signed)
  Care Coordination   Collaboration with gastroenterology office  Visit Note   12/03/2022 Name: April Weber MRN: [id number] DOB: 12-27-1945  April Weber is a 77 y.o. year old female who sees Raliegh Ip, DO for primary care. I  spoke with gastroenterology staff at [phone]  What matters to the patients health and wellness today?  New gastroenterologist Confirmed a scheduled appointment was made on 11/30/22 for Dr     Goals Addressed             This Visit's Progress    [facility] care coordination services   On track    Interventions Today    Flowsheet Row Most Recent Value  Chronic Disease   Chronic disease during today's visit Other  General Interventions   General Interventions Discussed/Reviewed General Interventions Reviewed, Communication with  Doctor Visits Discussed/Reviewed Doctor Visits Reviewed, PCP, Specialist  PCP/Specialist Visits Compliance with follow-up visit  Communication with --  [oncology dietitian, Rae Roam via email - Can patient have cheerios?]  Exercise Interventions   Exercise Discussed/Reviewed Physical Activity  Physical Activity Discussed/Reviewed Physical Activity Reviewed, Home Exercise Program (HEP)  Education Interventions   Education Provided Provided Education  [cheerios ? glyphosate]  Provided Verbal Education On Nutrition, Labs  Mental Health Interventions   Mental Health Discussed/Reviewed Mental Health Reviewed  Nutrition Interventions   Nutrition Discussed/Reviewed Nutrition Reviewed, Fluid intake  [cheerios, food to eat after reversal]              SDOH assessments and interventions completed:  No     Care Coordination Interventions:  Yes, provided   Follow up plan: Follow up call scheduled for 01/18/23    Encounter Outcome:  Pt. Visit Completed   Ilean Spradlin L. Noelle Penner, RN, BSN, CCM [facility] Care Management Community Coordinator Office number [phone]

## 2022-12-03 NOTE — Patient Instructions (Signed)
Visit Information  Thank you for taking time to visit with me today. Please don't hesitate to contact me if I can be of assistance to you.   Following are the goals we discussed today:   Goals Addressed             This Visit's Progress    [facility] care coordination services   On track    Interventions Today    Flowsheet Row Most Recent Value  Chronic Disease   Chronic disease during today's visit Other  [check on gastroenterologist availability]  General Interventions   General Interventions Discussed/Reviewed Doctor Visits, Communication with  Doctor Visits Discussed/Reviewed Doctor Visits Reviewed, Specialist  Communication with PCP/Specialists  Nicholes Stairs to [facility] gstroenterology office staff to confirm an appointment was made for patient on 11/30/22 for october 2024 with a new GI provider]              Our next appointment is by telephone on 01/18/23 at 3:30 pm  Please call the care guide team at [phone] if you need to cancel or reschedule your appointment.   If you are experiencing a Mental Health or Behavioral Health Crisis or need someone to talk to, please call the Suicide and Crisis Lifeline: 988 call the [place] National Suicide Prevention Lifeline: (336)834-7699 or TTY: (734)882-4761 TTY (302)275-7633) to talk to a trained counselor call 1-800-273-TALK (toll free, 24 hour hotline) call the [facility]: [phone] call 911   Patient verbalizes understanding of instructions and care plan provided today and agrees to view in MyChart. Active MyChart status and patient understanding of how to access instructions and care plan via MyChart confirmed with patient.     The patient has been provided with contact information for the care management team and has been advised to call with any health related questions or concerns.   Joleigh Mineau L. Noelle Penner, RN, BSN, CCM [facility] Care Management Community Coordinator Office number [phone]

## 2022-12-22 NOTE — Progress Notes (Signed)
Surgery orders requested via Epic inbox. °

## 2022-12-27 ENCOUNTER — Ambulatory Visit: Payer: Self-pay | Admitting: General Surgery

## 2022-12-27 NOTE — Progress Notes (Signed)
Second request for pre op orders in [facility]: Spoke with Toniann Fail, Dr Sheliah Hatch is out of the office until 01/11/23.

## 2022-12-28 NOTE — Patient Instructions (Signed)
SURGICAL WAITING ROOM VISITATION  Patients having surgery or a procedure may have no more than 2 support people in the waiting area - these visitors may rotate.    Children under the age of 15 must have an adult with them who is not the patient.  Due to an increase in RSV and influenza rates and associated hospitalizations, children ages 56 and under may not visit patients in [facility] hospitals.  If the patient needs to stay at the hospital during part of their recovery, the visitor guidelines for inpatient rooms apply. Pre-op nurse will coordinate an appropriate time for 1 support person to accompany patient in pre-op.  This support person may not rotate.    Please refer to the [facility] website for the visitor guidelines for Inpatients (after your surgery is over and you are in a regular room).    Your procedure is scheduled on: 01/11/23   Report to [facility] Main Entrance    Report to admitting at 10:55 AM   Call this number if you have problems the morning of surgery [phone]   Do not eat food :After Midnight.   After Midnight you may have the following liquids until 10:10 AM DAY OF SURGERY  Water Non-Citrus Juices (without pulp, NO RED-Apple, White grape, White cranberry) Black Coffee (NO MILK/CREAM OR CREAMERS, sugar ok)  Clear Tea (NO MILK/CREAM OR CREAMERS, sugar ok) regular and decaf                             Plain Jell-O (NO RED)                                           Fruit ices (not with fruit pulp, NO RED)                                     Popsicles (NO RED)                                                               Sports drinks like Gatorade (NO RED)     The day of surgery:  Drink ONE (1) Pre-Surgery Clear Ensure at 10:10 AM the morning of surgery. Drink in one sitting. Do not sip.  This drink was given to you during your hospital  pre-op appointment visit. Nothing else to drink after completing the  Pre-Surgery Clear Ensure.           If you have questions, please contact your surgeon's office.   FOLLOW BOWEL PREP AND ANY ADDITIONAL PRE OP INSTRUCTIONS YOU RECEIVED FROM YOUR SURGEON'S OFFICE!!!     Oral Hygiene is also important to reduce your risk of infection.                                    Remember - BRUSH YOUR TEETH THE MORNING OF SURGERY WITH YOUR REGULAR TOOTHPASTE  DENTURES WILL BE REMOVED PRIOR TO SURGERY PLEASE DO NOT APPLY "Poly grip" OR  ADHESIVES!!!   Stop all vitamins and herbal supplements 7 days before surgery.   Take these medicines the morning of surgery with A SIP OF WATER: Tylenol, Amlodipine, Atenolol, Levothyroxine, Zofran                              You may not have any metal on your body including hair pins, jewelry, and body piercing             Do not wear make-up, lotions, powders, perfumes, or deodorant  Do not wear nail polish including gel and S&S, artificial/acrylic nails, or any other type of covering on natural nails including finger and toenails. If you have artificial nails, gel coating, etc. that needs to be removed by a nail salon please have this removed prior to surgery or surgery may need to be canceled/ delayed if the surgeon/ anesthesia feels like they are unable to be safely monitored.   Do not shave  48 hours prior to surgery.    Do not bring valuables to the hospital. [facility] IS NOT             RESPONSIBLE   FOR VALUABLES.   Contacts, glasses, dentures or bridgework may not be worn into surgery.   Bring small overnight bag day of surgery.   DO NOT BRING YOUR HOME MEDICATIONS TO THE HOSPITAL. PHARMACY WILL DISPENSE MEDICATIONS LISTED ON YOUR MEDICATION LIST TO YOU DURING YOUR ADMISSION IN THE HOSPITAL!              Please read over the following fact sheets you were given: IF YOU HAVE QUESTIONS ABOUT YOUR PRE-OP INSTRUCTIONS PLEASE CALL [phone] Contras   If you received a COVID test during your pre-op visit  it is requested that you wear a mask when out in  public, stay away from anyone that may not be feeling well and notify your surgeon if you develop symptoms. If you test positive for Covid or have been in contact with anyone that has tested positive in the last 10 days please notify you surgeon.    [facility] - Preparing for Surgery Before surgery, you can play an important role.  Because skin is not sterile, your skin needs to be as free of germs as possible.  You can reduce the number of germs on your skin by washing with CHG (chlorahexidine gluconate) soap before surgery.  CHG is an antiseptic cleaner which kills germs and bonds with the skin to continue killing germs even after washing. Please DO NOT use if you have an allergy to CHG or antibacterial soaps.  If your skin becomes reddened/irritated stop using the CHG and inform your nurse when you arrive at Short Stay. Do not shave (including legs and underarms) for at least 48 hours prior to the first CHG shower.  You may shave your face/neck.  Please follow these instructions carefully:  1.  Shower with CHG Soap the night before surgery and the  morning of surgery.  2.  If you choose to wash your hair, wash your hair first as usual with your normal  shampoo.  3.  After you shampoo, rinse your hair and body thoroughly to remove the shampoo.                             4.  Use CHG as you would any other liquid soap.  You can  apply chg directly to the skin and wash.  Gently with a scrungie or clean washcloth.  5.  Apply the CHG Soap to your body ONLY FROM THE NECK DOWN.   Do   not use on face/ open                           Wound or open sores. Avoid contact with eyes, ears mouth and   genitals (private parts).                       Wash face,  Genitals (private parts) with your normal soap.             6.  Wash thoroughly, paying special attention to the area where your    surgery  will be performed.  7.  Thoroughly rinse your body with warm water from the neck down.  8.  DO NOT shower/wash  with your normal soap after using and rinsing off the CHG Soap.                9.  Pat yourself dry with a clean towel.            10.  Wear clean pajamas.            11.  Place clean sheets on your bed the night of your first shower and do not  sleep with pets. Day of Surgery : Do not apply any lotions/deodorants the morning of surgery.  Please wear clean clothes to the hospital/surgery center.  FAILURE TO FOLLOW THESE INSTRUCTIONS MAY RESULT IN THE CANCELLATION OF YOUR SURGERY  PATIENT SIGNATURE_________________________________  NURSE SIGNATURE__________________________________  ________________________________________________________________________

## 2022-12-28 NOTE — Progress Notes (Signed)
COVID Vaccine Completed: yes  Date of COVID positive in last 90 days:  PCP - Delynn Flavin, DO Cardiologist -  Oncologist- [name], MD  Chest x-ray - 03/16/22 Epic EKG - 03/25/22 Epic Stress Test -  ECHO - 10/22/09 Epic Cardiac Cath -  Pacemaker/ICD device last checked: Spinal Cord Stimulator:  Bowel Prep -   Sleep Study -  CPAP -   Fasting Blood Sugar -  Checks Blood Sugar _____ times a day  Last dose of GLP1 agonist-  N/A GLP1 instructions:  N/A   Last dose of SGLT-2 inhibitors-  N/A SGLT-2 instructions: N/A   Blood Thinner Instructions:  Time Aspirin Instructions: ASA 81 Last Dose:  Activity level:  Can go up a flight of stairs and perform activities of daily living without stopping and without symptoms of chest pain or shortness of breath.  Able to exercise without symptoms  Unable to go up a flight of stairs without symptoms of     Anesthesia review:   Patient denies shortness of breath, fever, cough and chest pain at PAT appointment  Patient verbalized understanding of instructions that were given to them at the PAT appointment. Patient was also instructed that they will need to review over the PAT instructions again at home before surgery.

## 2022-12-29 ENCOUNTER — Encounter (HOSPITAL_COMMUNITY): Payer: Self-pay

## 2022-12-29 ENCOUNTER — Encounter (HOSPITAL_COMMUNITY)
Admission: RE | Admit: 2022-12-29 | Discharge: 2022-12-29 | Disposition: A | Discharge status: Home / Self Care | Payer: Medicare Other | Source: Ambulatory Visit | Attending: General Surgery | Admitting: General Surgery

## 2022-12-29 ENCOUNTER — Other Ambulatory Visit: Payer: Self-pay

## 2022-12-29 VITALS — BP 121/71 | HR 69 | Temp 97.7°F | Resp 14 | Ht 66.5 in | Wt 138.0 lb

## 2022-12-29 DIAGNOSIS — I10 Essential (primary) hypertension: Secondary | ICD-10-CM | POA: Insufficient documentation

## 2022-12-29 DIAGNOSIS — Z01812 Encounter for preprocedural laboratory examination: Secondary | ICD-10-CM | POA: Insufficient documentation

## 2022-12-29 HISTORY — DX: Diverticulitis of large intestine without perforation or abscess without bleeding: K57.32

## 2022-12-29 HISTORY — DX: Headache, unspecified: R51.9

## 2022-12-29 LAB — CBC
HCT: 36.1 % (ref 36.0–46.0)
Hemoglobin: 12.3 g/dL (ref 12.0–15.0)
MCH: 33.8 pg (ref 26.0–34.0)
MCHC: 34.1 g/dL (ref 30.0–36.0)
MCV: 99.2 fL (ref 80.0–100.0)
Platelets: 212 10*3/uL (ref 150–400)
RBC: 3.64 MIL/uL — ABNORMAL LOW (ref 3.87–5.11)
RDW: 14.4 % (ref 11.5–15.5)
WBC: 7 10*3/uL (ref 4.0–10.5)
nRBC: 0 % (ref 0.0–0.2)

## 2022-12-29 LAB — BASIC METABOLIC PANEL
Anion gap: 10 (ref 5–15)
BUN: 22 mg/dL (ref 8–23)
CO2: 26 mmol/L (ref 22–32)
Calcium: 9.7 mg/dL (ref 8.9–10.3)
Chloride: 101 mmol/L (ref 98–111)
Creatinine, Ser: 1.55 mg/dL — ABNORMAL HIGH (ref 0.44–1.00)
GFR, Estimated: 34 mL/min — ABNORMAL LOW (ref >60)
Glucose, Bld: 101 mg/dL — ABNORMAL HIGH (ref 70–99)
Potassium: 4.2 mmol/L (ref 3.5–5.1)
Sodium: 137 mmol/L (ref 135–145)

## 2023-01-11 ENCOUNTER — Encounter (HOSPITAL_COMMUNITY): Payer: Self-pay | Admitting: General Surgery

## 2023-01-11 ENCOUNTER — Inpatient Hospital Stay (HOSPITAL_COMMUNITY): Payer: Medicare Other | Admitting: Anesthesiology

## 2023-01-11 ENCOUNTER — Other Ambulatory Visit: Payer: Self-pay

## 2023-01-11 ENCOUNTER — Encounter (HOSPITAL_COMMUNITY)
Admission: RE | Disposition: A | Discharge status: Home / Self Care | Payer: Self-pay | Source: Ambulatory Visit | Attending: General Surgery

## 2023-01-11 ENCOUNTER — Inpatient Hospital Stay (HOSPITAL_COMMUNITY)
Admission: RE | Admit: 2023-01-11 | Discharge: 2023-01-14 | DRG: 331 | Disposition: A | Discharge status: Home / Self Care | Payer: Medicare Other | Source: Ambulatory Visit | Attending: General Surgery | Admitting: General Surgery

## 2023-01-11 DIAGNOSIS — N1831 Chronic kidney disease, stage 3a: Secondary | ICD-10-CM | POA: Diagnosis not present

## 2023-01-11 DIAGNOSIS — I739 Peripheral vascular disease, unspecified: Secondary | ICD-10-CM | POA: Diagnosis present

## 2023-01-11 DIAGNOSIS — E039 Hypothyroidism, unspecified: Secondary | ICD-10-CM | POA: Diagnosis present

## 2023-01-11 DIAGNOSIS — I10 Essential (primary) hypertension: Secondary | ICD-10-CM | POA: Diagnosis present

## 2023-01-11 DIAGNOSIS — Z888 Allergy status to other drugs, medicaments and biological substances status: Secondary | ICD-10-CM

## 2023-01-11 DIAGNOSIS — Z79890 Hormone replacement therapy (postmenopausal): Secondary | ICD-10-CM | POA: Diagnosis not present

## 2023-01-11 DIAGNOSIS — K5732 Diverticulitis of large intestine without perforation or abscess without bleeding: Secondary | ICD-10-CM | POA: Diagnosis not present

## 2023-01-11 DIAGNOSIS — Z885 Allergy status to narcotic agent status: Secondary | ICD-10-CM | POA: Diagnosis not present

## 2023-01-11 DIAGNOSIS — Z86718 Personal history of other venous thrombosis and embolism: Secondary | ICD-10-CM | POA: Diagnosis not present

## 2023-01-11 DIAGNOSIS — Z881 Allergy status to other antibiotic agents status: Secondary | ICD-10-CM

## 2023-01-11 DIAGNOSIS — Z886 Allergy status to analgesic agent status: Secondary | ICD-10-CM | POA: Diagnosis not present

## 2023-01-11 DIAGNOSIS — I129 Hypertensive chronic kidney disease with stage 1 through stage 4 chronic kidney disease, or unspecified chronic kidney disease: Secondary | ICD-10-CM | POA: Diagnosis not present

## 2023-01-11 DIAGNOSIS — Z432 Encounter for attention to ileostomy: Secondary | ICD-10-CM | POA: Diagnosis not present

## 2023-01-11 DIAGNOSIS — Z87891 Personal history of nicotine dependence: Secondary | ICD-10-CM

## 2023-01-11 DIAGNOSIS — Z7982 Long term (current) use of aspirin: Secondary | ICD-10-CM

## 2023-01-11 DIAGNOSIS — R001 Bradycardia, unspecified: Secondary | ICD-10-CM | POA: Diagnosis present

## 2023-01-11 DIAGNOSIS — Z79899 Other long term (current) drug therapy: Secondary | ICD-10-CM

## 2023-01-11 DIAGNOSIS — Z91040 Latex allergy status: Secondary | ICD-10-CM | POA: Diagnosis not present

## 2023-01-11 DIAGNOSIS — R634 Abnormal weight loss: Principal | ICD-10-CM

## 2023-01-11 DIAGNOSIS — K5792 Diverticulitis of intestine, part unspecified, without perforation or abscess without bleeding: Secondary | ICD-10-CM

## 2023-01-11 DIAGNOSIS — K5710 Diverticulosis of small intestine without perforation or abscess without bleeding: Secondary | ICD-10-CM | POA: Diagnosis not present

## 2023-01-11 DIAGNOSIS — Z880 Allergy status to penicillin: Secondary | ICD-10-CM

## 2023-01-11 HISTORY — PX: ILEOSTOMY CLOSURE: SHX1784

## 2023-01-11 LAB — CBC
HCT: 34.4 % — ABNORMAL LOW (ref 36.0–46.0)
Hemoglobin: 12.1 g/dL (ref 12.0–15.0)
MCH: 33.9 pg (ref 26.0–34.0)
MCHC: 35.2 g/dL (ref 30.0–36.0)
MCV: 96.4 fL (ref 80.0–100.0)
Platelets: 171 10*3/uL (ref 150–400)
RBC: 3.57 MIL/uL — ABNORMAL LOW (ref 3.87–5.11)
RDW: 13.1 % (ref 11.5–15.5)
WBC: 10.8 10*3/uL — ABNORMAL HIGH (ref 4.0–10.5)
nRBC: 0 % (ref 0.0–0.2)

## 2023-01-11 LAB — CREATININE, SERUM
Creatinine, Ser: 1.22 mg/dL — ABNORMAL HIGH (ref 0.44–1.00)
GFR, Estimated: 45 mL/min — ABNORMAL LOW (ref >60)

## 2023-01-11 SURGERY — CLOSURE, ILEOSTOMY
Anesthesia: General

## 2023-01-11 MED ORDER — PROPOFOL 10 MG/ML IV BOLUS
INTRAVENOUS | Status: DC | PRN
  Administered 2023-01-11: 110 mg via INTRAVENOUS

## 2023-01-11 MED ORDER — SODIUM CHLORIDE 0.45 % IV SOLN: INTRAVENOUS | Status: DC

## 2023-01-11 MED ORDER — BUPIVACAINE LIPOSOME 1.3 % IJ SUSP
INTRAMUSCULAR | Status: AC
  Filled 2023-01-11: qty 20

## 2023-01-11 MED ORDER — FENTANYL CITRATE (PF) 100 MCG/2ML IJ SOLN
INTRAMUSCULAR | Status: DC | PRN
  Administered 2023-01-11: 100 ug via INTRAVENOUS
  Administered 2023-01-11: 50 ug via INTRAVENOUS

## 2023-01-11 MED ORDER — BUPIVACAINE-EPINEPHRINE 0.25% -1:200000 IJ SOLN
INTRAMUSCULAR | Status: AC
  Filled 2023-01-11: qty 1

## 2023-01-11 MED ORDER — ROCURONIUM BROMIDE 10 MG/ML (PF) SYRINGE
PREFILLED_SYRINGE | INTRAVENOUS | Status: AC
  Filled 2023-01-11: qty 10

## 2023-01-11 MED ORDER — OLAPARIB 100 MG PO TABS: 100.0000 mg | ORAL_TABLET | Freq: Two times a day (BID) | ORAL | Status: DC

## 2023-01-11 MED ORDER — DEXAMETHASONE SODIUM PHOSPHATE 10 MG/ML IJ SOLN
INTRAMUSCULAR | Status: AC
  Filled 2023-01-11: qty 1

## 2023-01-11 MED ORDER — BUPIVACAINE-EPINEPHRINE (PF) 0.25% -1:200000 IJ SOLN
INTRAMUSCULAR | Status: DC | PRN
  Administered 2023-01-11: 50 mL

## 2023-01-11 MED ORDER — FENTANYL CITRATE PF 50 MCG/ML IJ SOSY
PREFILLED_SYRINGE | INTRAMUSCULAR | Status: AC
  Filled 2023-01-11: qty 1

## 2023-01-11 MED ORDER — FENTANYL CITRATE PF 50 MCG/ML IJ SOSY
25.0000 ug | PREFILLED_SYRINGE | INTRAMUSCULAR | Status: DC | PRN
  Administered 2023-01-11: 25 ug via INTRAVENOUS
  Administered 2023-01-11: 50 ug via INTRAVENOUS

## 2023-01-11 MED ORDER — FENTANYL CITRATE (PF) 100 MCG/2ML IJ SOLN
INTRAMUSCULAR | Status: AC
  Filled 2023-01-11: qty 2

## 2023-01-11 MED ORDER — FENTANYL CITRATE PF 50 MCG/ML IJ SOSY
PREFILLED_SYRINGE | INTRAMUSCULAR | Status: AC
  Administered 2023-01-11: 25 ug via INTRAVENOUS
  Filled 2023-01-11: qty 1

## 2023-01-11 MED ORDER — OXYCODONE HCL 5 MG PO TABS
ORAL_TABLET | ORAL | Status: AC
  Filled 2023-01-11: qty 1

## 2023-01-11 MED ORDER — LIDOCAINE HCL (PF) 2 % IJ SOLN
INTRAMUSCULAR | Status: AC
  Filled 2023-01-11: qty 5

## 2023-01-11 MED ORDER — OXYCODONE HCL 5 MG PO TABS
5.0000 mg | ORAL_TABLET | Freq: Four times a day (QID) | ORAL | Status: DC | PRN
  Administered 2023-01-11 – 2023-01-14 (×8): 5 mg via ORAL
  Filled 2023-01-11 (×7): qty 1

## 2023-01-11 MED ORDER — FENTANYL CITRATE PF 50 MCG/ML IJ SOSY
PREFILLED_SYRINGE | INTRAMUSCULAR | Status: AC
  Administered 2023-01-11: 50 ug via INTRAVENOUS
  Filled 2023-01-11: qty 1

## 2023-01-11 MED ORDER — PROPOFOL 500 MG/50ML IV EMUL
INTRAVENOUS | Status: DC | PRN
Start: 2023-01-11 — End: 2023-01-11
  Administered 2023-01-11: 175 ug/kg/min via INTRAVENOUS

## 2023-01-11 MED ORDER — SIMVASTATIN 20 MG PO TABS
20.0000 mg | ORAL_TABLET | Freq: Every day | ORAL | Status: DC
  Administered 2023-01-11 – 2023-01-13 (×3): 20 mg via ORAL
  Filled 2023-01-11 (×3): qty 1

## 2023-01-11 MED ORDER — CHLORHEXIDINE GLUCONATE CLOTH 2 % EX PADS: 6.0000 | MEDICATED_PAD | Freq: Once | CUTANEOUS | Status: DC

## 2023-01-11 MED ORDER — ENSURE SURGERY PO LIQD
237.0000 mL | Freq: Two times a day (BID) | ORAL | Status: DC
  Administered 2023-01-12 – 2023-01-13 (×4): 237 mL via ORAL

## 2023-01-11 MED ORDER — ALVIMOPAN 12 MG PO CAPS: 12.0000 mg | ORAL_CAPSULE | Freq: Two times a day (BID) | ORAL | Status: DC

## 2023-01-11 MED ORDER — ONDANSETRON HCL 4 MG/2ML IJ SOLN: 4.0000 mg | Freq: Four times a day (QID) | INTRAMUSCULAR | Status: DC | PRN

## 2023-01-11 MED ORDER — CHLORHEXIDINE GLUCONATE 0.12 % MT SOLN
15.0000 mL | Freq: Once | OROMUCOSAL | Status: AC
  Administered 2023-01-11: 15 mL via OROMUCOSAL

## 2023-01-11 MED ORDER — EPINEPHRINE 0.3 MG/0.3ML IJ SOAJ: 0.3000 mg | INTRAMUSCULAR | Status: DC | PRN

## 2023-01-11 MED ORDER — ASPIRIN 81 MG PO TBEC
81.0000 mg | DELAYED_RELEASE_TABLET | Freq: Every day | ORAL | Status: DC
  Administered 2023-01-11 – 2023-01-13 (×3): 81 mg via ORAL
  Filled 2023-01-11 (×3): qty 1

## 2023-01-11 MED ORDER — LACTATED RINGERS IV SOLN: INTRAVENOUS | Status: DC

## 2023-01-11 MED ORDER — OYSTER SHELL CALCIUM/D3 500-5 MG-MCG PO TABS
1.0000 | ORAL_TABLET | Freq: Two times a day (BID) | ORAL | Status: DC
  Administered 2023-01-11 – 2023-01-14 (×6): 1 via ORAL
  Filled 2023-01-11 (×6): qty 1

## 2023-01-11 MED ORDER — ONDANSETRON HCL 4 MG/2ML IJ SOLN
INTRAMUSCULAR | Status: AC
  Filled 2023-01-11: qty 2

## 2023-01-11 MED ORDER — PHENYLEPHRINE HCL (PRESSORS) 10 MG/ML IV SOLN
INTRAVENOUS | Status: AC
  Filled 2023-01-11: qty 1

## 2023-01-11 MED ORDER — SUGAMMADEX SODIUM 200 MG/2ML IV SOLN
INTRAVENOUS | Status: DC | PRN
  Administered 2023-01-11: 140 mg via INTRAVENOUS

## 2023-01-11 MED ORDER — LIDOCAINE 2% (20 MG/ML) 5 ML SYRINGE
INTRAMUSCULAR | Status: DC | PRN
  Administered 2023-01-11: 100 mg via INTRAVENOUS

## 2023-01-11 MED ORDER — ROCURONIUM BROMIDE 10 MG/ML (PF) SYRINGE
PREFILLED_SYRINGE | INTRAVENOUS | Status: DC | PRN
  Administered 2023-01-11: 50 mg via INTRAVENOUS

## 2023-01-11 MED ORDER — ADULT MULTIVITAMIN W/MINERALS CH
1.0000 | ORAL_TABLET | Freq: Every day | ORAL | Status: DC
  Administered 2023-01-12 – 2023-01-14 (×3): 1 via ORAL
  Filled 2023-01-11 (×3): qty 1

## 2023-01-11 MED ORDER — MORPHINE SULFATE (PF) 2 MG/ML IV SOLN: 2.0000 mg | INTRAVENOUS | Status: DC | PRN

## 2023-01-11 MED ORDER — AMLODIPINE BESYLATE 5 MG PO TABS
5.0000 mg | ORAL_TABLET | Freq: Every day | ORAL | Status: DC
  Administered 2023-01-12 – 2023-01-14 (×3): 5 mg via ORAL
  Filled 2023-01-11 (×4): qty 1

## 2023-01-11 MED ORDER — PHENYLEPHRINE HCL-NACL 20-0.9 MG/250ML-% IV SOLN
INTRAVENOUS | Status: DC | PRN
Start: 2023-01-11 — End: 2023-01-11
  Administered 2023-01-11: 30 ug/min via INTRAVENOUS

## 2023-01-11 MED ORDER — LEVOTHYROXINE SODIUM 100 MCG PO TABS
100.0000 ug | ORAL_TABLET | Freq: Every day | ORAL | Status: DC
  Administered 2023-01-12 – 2023-01-14 (×3): 100 ug via ORAL
  Filled 2023-01-11 (×3): qty 1

## 2023-01-11 MED ORDER — DIPHENHYDRAMINE HCL 12.5 MG/5ML PO ELIX: 12.5000 mg | ORAL_SOLUTION | Freq: Four times a day (QID) | ORAL | Status: DC | PRN

## 2023-01-11 MED ORDER — DIPHENHYDRAMINE HCL 50 MG/ML IJ SOLN
12.5000 mg | Freq: Four times a day (QID) | INTRAMUSCULAR | Status: DC | PRN
  Filled 2023-01-11: qty 1

## 2023-01-11 MED ORDER — ENSURE PRE-SURGERY PO LIQD
296.0000 mL | Freq: Once | ORAL | Status: DC
  Filled 2023-01-11: qty 296

## 2023-01-11 MED ORDER — ONDANSETRON HCL 4 MG/2ML IJ SOLN
INTRAMUSCULAR | Status: DC | PRN
  Administered 2023-01-11: 4 mg via INTRAVENOUS

## 2023-01-11 MED ORDER — ACETAMINOPHEN 500 MG PO TABS
1000.0000 mg | ORAL_TABLET | ORAL | Status: AC
  Administered 2023-01-11: 1000 mg via ORAL
  Filled 2023-01-11: qty 2

## 2023-01-11 MED ORDER — ONDANSETRON HCL 4 MG/2ML IJ SOLN: 4.0000 mg | Freq: Once | INTRAMUSCULAR | Status: DC | PRN

## 2023-01-11 MED ORDER — 0.9 % SODIUM CHLORIDE (POUR BTL) OPTIME
TOPICAL | Status: DC | PRN
  Administered 2023-01-11: 1000 mL

## 2023-01-11 MED ORDER — PROPOFOL 10 MG/ML IV BOLUS
INTRAVENOUS | Status: AC
  Filled 2023-01-11: qty 20

## 2023-01-11 MED ORDER — ONDANSETRON HCL 4 MG PO TABS: 4.0000 mg | ORAL_TABLET | Freq: Four times a day (QID) | ORAL | Status: DC | PRN

## 2023-01-11 MED ORDER — PHENYLEPHRINE 80 MCG/ML (10ML) SYRINGE FOR IV PUSH (FOR BLOOD PRESSURE SUPPORT)
PREFILLED_SYRINGE | INTRAVENOUS | Status: DC | PRN
  Administered 2023-01-11: 160 ug via INTRAVENOUS
  Administered 2023-01-11 (×4): 80 ug via INTRAVENOUS

## 2023-01-11 MED ORDER — ACETAMINOPHEN 325 MG PO TABS
650.0000 mg | ORAL_TABLET | Freq: Four times a day (QID) | ORAL | Status: DC | PRN
  Administered 2023-01-11 – 2023-01-14 (×5): 650 mg via ORAL
  Filled 2023-01-11 (×6): qty 2

## 2023-01-11 MED ORDER — ACETAMINOPHEN 500 MG PO TABS: 1000.0000 mg | ORAL_TABLET | Freq: Four times a day (QID) | ORAL | Status: DC | PRN

## 2023-01-11 MED ORDER — METOPROLOL TARTRATE 5 MG/5ML IV SOLN: 5.0000 mg | Freq: Four times a day (QID) | INTRAVENOUS | Status: DC | PRN

## 2023-01-11 MED ORDER — CEFAZOLIN SODIUM-DEXTROSE 2-4 GM/100ML-% IV SOLN
2.0000 g | INTRAVENOUS | Status: AC
  Administered 2023-01-11: 2 g via INTRAVENOUS
  Filled 2023-01-11: qty 100

## 2023-01-11 MED ORDER — DEXAMETHASONE SODIUM PHOSPHATE 10 MG/ML IJ SOLN
INTRAMUSCULAR | Status: DC | PRN
  Administered 2023-01-11: 4 mg via INTRAVENOUS

## 2023-01-11 MED ORDER — ATENOLOL 25 MG PO TABS
12.5000 mg | ORAL_TABLET | Freq: Two times a day (BID) | ORAL | Status: DC
  Administered 2023-01-11 – 2023-01-14 (×6): 12.5 mg via ORAL
  Filled 2023-01-11 (×6): qty 1

## 2023-01-11 MED ORDER — ENOXAPARIN SODIUM 40 MG/0.4ML IJ SOSY
40.0000 mg | PREFILLED_SYRINGE | INTRAMUSCULAR | Status: DC
  Administered 2023-01-12 – 2023-01-14 (×3): 40 mg via SUBCUTANEOUS
  Filled 2023-01-11 (×3): qty 0.4

## 2023-01-11 MED ORDER — ORAL CARE MOUTH RINSE: 15.0000 mL | Freq: Once | OROMUCOSAL | Status: AC

## 2023-01-11 SURGICAL SUPPLY — 54 items
APL PRP STRL LF DISP 70% ISPRP (MISCELLANEOUS) ×1
BAG COUNTER SPONGE SURGICOUNT (BAG) IMPLANT
BAG SPNG CNTER NS LX DISP (BAG)
BLADE HEX COATED 2.75 (ELECTRODE) ×2 IMPLANT
CHLORAPREP W/TINT 26 (MISCELLANEOUS) ×2 IMPLANT
COVER MAYO STAND STRL (DRAPES) ×2 IMPLANT
DRAPE LAPAROSCOPIC ABDOMINAL (DRAPES) ×2 IMPLANT
DRAPE UTILITY XL STRL (DRAPES) IMPLANT
DRAPE WARM FLUID 44X44 (DRAPES) ×2 IMPLANT
DRSG OPSITE POSTOP 4X10 (GAUZE/BANDAGES/DRESSINGS) IMPLANT
DRSG OPSITE POSTOP 4X6 (GAUZE/BANDAGES/DRESSINGS) IMPLANT
DRSG OPSITE POSTOP 4X8 (GAUZE/BANDAGES/DRESSINGS) IMPLANT
DRSG TELFA 3X8 NADH STRL (GAUZE/BANDAGES/DRESSINGS) IMPLANT
ELECT REM PT RETURN 15FT ADLT (MISCELLANEOUS) ×2 IMPLANT
GAUZE SPONGE 4X4 12PLY STRL (GAUZE/BANDAGES/DRESSINGS) ×2 IMPLANT
GLOVE BIOGEL PI IND STRL 7.0 (GLOVE) ×2 IMPLANT
GLOVE SURG SS PI 7.0 STRL IVOR (GLOVE) ×2 IMPLANT
GOWN STRL REUS W/ TWL LRG LVL3 (GOWN DISPOSABLE) ×2 IMPLANT
GOWN STRL REUS W/ TWL XL LVL3 (GOWN DISPOSABLE) IMPLANT
GOWN STRL REUS W/TWL LRG LVL3 (GOWN DISPOSABLE) ×1
GOWN STRL REUS W/TWL XL LVL3 (GOWN DISPOSABLE)
HANDLE SUCTION POOLE (INSTRUMENTS) ×2 IMPLANT
HOLDER FOLEY CATH W/STRAP (MISCELLANEOUS) IMPLANT
KIT BASIN OR (CUSTOM PROCEDURE TRAY) ×2 IMPLANT
KIT TURNOVER KIT A (KITS) IMPLANT
MANIFOLD NEPTUNE II (INSTRUMENTS) ×2 IMPLANT
PACK GENERAL/GYN (CUSTOM PROCEDURE TRAY) ×2 IMPLANT
RELOAD PROXIMATE 75MM BLUE (ENDOMECHANICALS) ×2
RELOAD STAPLE 75 3.8 BLU REG (ENDOMECHANICALS) IMPLANT
STAPLER GUN LINEAR PROX 60 (STAPLE) IMPLANT
STAPLER PROXIMATE 75MM BLUE (STAPLE) IMPLANT
SUCTION POOLE HANDLE (INSTRUMENTS) ×1
SUT MNCRL AB 4-0 PS2 18 (SUTURE) IMPLANT
SUT NOVA NAB DX-16 0-1 5-0 T12 (SUTURE) IMPLANT
SUT NOVA NAB GS-21 0 18 T12 DT (SUTURE) ×4 IMPLANT
SUT PDS AB 0 CT1 36 (SUTURE) IMPLANT
SUT PDS AB 1 CT1 27 (SUTURE) IMPLANT
SUT PROLENE 2 0 BLUE (SUTURE) IMPLANT
SUT SILK 2 0 (SUTURE) ×1
SUT SILK 2 0 SH CR/8 (SUTURE) ×2 IMPLANT
SUT SILK 2-0 18XBRD TIE 12 (SUTURE) ×2 IMPLANT
SUT SILK 3 0 (SUTURE) ×1
SUT SILK 3 0 SH CR/8 (SUTURE) ×2 IMPLANT
SUT SILK 3-0 18XBRD TIE 12 (SUTURE) ×2 IMPLANT
SUT VIC AB 2-0 SH 18 (SUTURE) IMPLANT
SUT VIC AB 2-0 SH 27 (SUTURE)
SUT VIC AB 2-0 SH 27X BRD (SUTURE) ×4 IMPLANT
SUT VIC AB 3-0 SH 27 (SUTURE) ×2
SUT VIC AB 3-0 SH 27XBRD (SUTURE) IMPLANT
SUT VIC AB 4-0 PS2 18 (SUTURE) ×2 IMPLANT
TAPE CLOTH SURG 4X10 WHT LF (GAUZE/BANDAGES/DRESSINGS) IMPLANT
TOWEL OR 17X26 10 PK STRL BLUE (TOWEL DISPOSABLE) ×4 IMPLANT
TOWEL OR NON WOVEN STRL DISP B (DISPOSABLE) ×4 IMPLANT
YANKAUER SUCT BULB TIP NO VENT (SUCTIONS) ×2 IMPLANT

## 2023-01-11 NOTE — H&P (Signed)
Interval History:  Her pain is much improved. She is eating some food but on a very regimented diet. She is concerned about weight loss. She is not having issues pouching the ostomy. She does have frequent drainage from her anus.  Physical Examination:   Physical Exam Constitutional:  Appearance: Normal appearance.  HENT:  Head: Normocephalic and atraumatic.  Pulmonary:  Effort: Pulmonary effort is normal.  Abdominal:  Comments: Incision well-healed, ostomy pink and patent and functional.  Musculoskeletal:  General: Normal range of motion.  Cervical back: Normal range of motion.  Neurological:  General: No focal deficit present.  Mental Status: She is alert and oriented to person, place, and time. Mental status is at baseline.  Psychiatric:  Mood and Affect: Mood normal.  Behavior: Behavior normal.  Thought Content: Thought content normal.     Assessment and Plan:   April Weber is a 77 y.o. female who underwent colovesical fistula takedown with colectomy and anastomosis and diverting ileostomy 4 weeks ago.  Diagnoses and all orders for this visit:  Diverticulitis - X-ray barium enema air contrast; Future  Ileostomy care (CMS/HHS-HCC)  We will get a barium enema to ensure anastomosis is well-healed. Then we will perform reversal of diverting ileostomy.  The anatomy & physiology of the digestive tract was discussed. The pathophysiology was discussed. Possibility of remaining with an ostomy permanently was discussed. I offered ostomy takedown. Laparoscopic & open techniques were discussed.   Risks such as bleeding, infection, abscess, leak, reoperation, possible re-ostomy, injury to other organs, need for repair of tissues / organs, need for further treatment, hernia, heart attack, death, and other risks were discussed. I noted a good likelihood this will help address the problem. Goals of post-operative recovery were discussed as well. We will work to minimize  complications. Questions were answered. The patient expresses understanding & wishes to proceed with surgery.  The plan was discussed in detail with the patient today, who expressed understanding. The patient has my contact information, and understands to call me with any additional questions or concerns in the interval. I would be happy to see the patient back sooner if the need arises.

## 2023-01-11 NOTE — Anesthesia Preprocedure Evaluation (Addendum)
Anesthesia Evaluation  Patient identified by MRN, date of birth, ID band Patient awake    Reviewed: Allergy & Precautions, NPO status , Patient's Chart, lab work & pertinent test results, reviewed documented beta blocker date and time   History of Anesthesia Complications (+) PONV and history of anesthetic complications  Airway Mallampati: II  TM Distance: >3 FB Neck ROM: Full    Dental  (+) Dental Advisory Given, Edentulous Upper, Partial Lower   Pulmonary former smoker   Pulmonary exam normal breath sounds clear to auscultation       Cardiovascular hypertension, Pt. on home beta blockers and Pt. on medications + Peripheral Vascular Disease and + DVT  Normal cardiovascular exam Rhythm:Regular Rate:Normal     Neuro/Psych  Headaches  negative psych ROS   GI/Hepatic Neg liver ROS,,,Diverticulitis s/p ileostomy    Endo/Other  Hypothyroidism    Renal/GU Renal InsufficiencyRenal disease (single kidney)     Musculoskeletal  (+) Arthritis ,    Abdominal   Peds  Hematology negative hematology ROS (+)   Anesthesia Other Findings Day of surgery medications reviewed with the patient.  Reproductive/Obstetrics                             Anesthesia Physical Anesthesia Plan  ASA: 3  Anesthesia Plan: General   Post-op Pain Management: Tylenol PO (pre-op)*   Induction: Intravenous  PONV Risk Score and Plan: 4 or greater and Dexamethasone, Ondansetron, Treatment may vary due to age or medical condition and TIVA  Airway Management Planned: Oral ETT  Additional Equipment:   Intra-op Plan:   Post-operative Plan: Extubation in OR  Informed Consent: I have reviewed the patients History and Physical, chart, labs and discussed the procedure including the risks, benefits and alternatives for the proposed anesthesia with the patient or authorized representative who has indicated his/her  understanding and acceptance.     Dental advisory given  Plan Discussed with: CRNA  Anesthesia Plan Comments:         Anesthesia Quick Evaluation

## 2023-01-11 NOTE — Transfer of Care (Signed)
Immediate Anesthesia Transfer of Care Note  Patient: April Weber  Procedure(s) Performed: LOOP ILEOSTOMY REVERSAL  Patient Location: PACU  Anesthesia Type:General  Level of Consciousness: awake, alert , and oriented  Airway & Oxygen Therapy: Patient Spontanous Breathing and Patient connected to face mask oxygen  Post-op Assessment: Report given to RN, Post -op Vital signs reviewed and stable, and Patient moving all extremities X 4  Post vital signs: Reviewed and stable  Last Vitals:  Vitals Value Taken Time  BP 156/71   Temp    Pulse 72 01/11/23 1423  Resp 14 01/11/23 1423  SpO2 100 % 01/11/23 1423  Vitals shown include unfiled device data.  Last Pain:  Vitals:   01/11/23 1129  TempSrc: Oral  PainSc:       Patients Stated Pain Goal: 7 (01/11/23 1120)  Complications: No notable events documented.

## 2023-01-11 NOTE — Op Note (Signed)
PATIENT:  April Weber  77 y.o. female  PRE-OPERATIVE DIAGNOSIS:  diverticulitis  POST-OPERATIVE DIAGNOSIS:  diverticulitis  PROCEDURE:  Procedure(s): LOOP ILEOSTOMY REVERSAL  SURGEON:  Surgeon(s): Iqra Rotundo, [name], MD Axel Filler, MD  ASSISTANT: Axel Filler, M.D., He was present for the entirety of the case and necessary due to complexity of the case.  ANESTHESIA: local and general  Indications for procedure: [name] is a 77 y.o. female with ileostomy creation during diverticulitis surgery. She has now covered from previous surgery and presents for reversal.   Description of procedure: The patient was brought into the operative suite, placed supine. Anesthesia was administered with endotracheal tube. Patient was strapped in place and foot board was secured. All pressure points were offloaded by foam padding. The patient was prepped and draped in the usual sterile fashion.  Next, attention was turned to the skin portion of the case. An elliptical incision was made around the ileostomy and cautery was used to dissect and separate the surrounding tissue from the intestine. The intestine was completely mobilized.  Healthy intestine was identified. An enterotomy was made on each end. A 75mm GIA stapler was used to create a new anastomosis. The ileostomy portion was divided with cautery and the mesentery was clamped and divided and then 2-0 silk ties were used to ligate the mesentery ends. A TA 60 was used to close the enterotomy defect.  The new anastomosis was then placed back into the abdomen. 50 ml Marcaine/Exparel mix was injected into the fascia and subcutaneous tissues. The fascia was closed with a running 0 PDS. A 3-0 vicryl was used to make a purse string of the deep dermal layer to tighten the skin.   The patient woke from anesthesia and was brought to PACU in stable condition. All counts were correct  Findings: intact ileo-ileal anastomosis  Specimen:  gallbladder  Blood loss: 20 ml ml  Local anesthesia: 50 ml Marcaine/Exparel mix  Complications: none  PLAN OF CARE: Admit to inpatient   PATIENT DISPOSITION:  PACU - hemodynamically stable.  Feliciana Rossetti, M.D. General, Bariatric, & Minimally Invasive Surgery [facility]

## 2023-01-11 NOTE — Anesthesia Procedure Notes (Signed)
Procedure Name: Intubation Date/Time: 01/11/2023 1:08 PM  Performed by: Nelle Don, CRNAPre-anesthesia Checklist: Patient identified, Emergency Drugs available, Patient being monitored and Suction available Patient Re-evaluated:Patient Re-evaluated prior to induction Oxygen Delivery Method: Circle system utilized Preoxygenation: Pre-oxygenation with 100% oxygen Induction Type: IV induction Ventilation: Mask ventilation without difficulty Laryngoscope Size: Mac and 3 Grade View: Grade I Tube type: Oral Tube size: 7.0 mm Number of attempts: 1 Airway Equipment and Method: Stylet Placement Confirmation: ETT inserted through vocal cords under direct vision, positive ETCO2 and breath sounds checked- equal and bilateral Secured at: 22 cm Tube secured with: Tape Dental Injury: Teeth and Oropharynx as per pre-operative assessment  Comments: Intubated by [employer] with supervision. Grade 1 view, passed easily

## 2023-01-11 NOTE — Progress Notes (Signed)
Prior-To-Admission Oral Chemotherapy for Treatment of Oncologic Disease   Order noted from Dr. Sheliah Hatch to continue prior-to-admission oral chemotherapy regimen of olaparib.  Procedure Per Pharmacy & Therapeutics Committee Policy: Orders for continuation of home oral chemotherapy for treatment of an oncologic disease will be held unless approved by an oncologist during current admission.    For patients receiving oncology care at [facility], inpatient pharmacist contacts patient's oncologist during regular office hours to review. If earlier review is medically necessary, attending physician consults [facility] on-call oncologist   For patients receiving oncology care outside of [facility], attending physician consults patient's oncologist to review. If this oncologist or their coverage cannot be reached, attending physician consults [facility] on-call oncologist   Oral chemotherapy continuation order is on hold pending oncologist review, [facility] oncologist Dr. Bertis Ruddy will be notified by inpatient pharmacy during office hours    Cindi Carbon, PharmD 01/11/2023, 6:20 PM

## 2023-01-12 ENCOUNTER — Encounter (HOSPITAL_COMMUNITY): Payer: Self-pay | Admitting: General Surgery

## 2023-01-12 LAB — BASIC METABOLIC PANEL
Anion gap: 9 (ref 5–15)
BUN: 18 mg/dL (ref 8–23)
CO2: 18 mmol/L — ABNORMAL LOW (ref 22–32)
Calcium: 7.8 mg/dL — ABNORMAL LOW (ref 8.9–10.3)
Chloride: 95 mmol/L — ABNORMAL LOW (ref 98–111)
Creatinine, Ser: 1 mg/dL (ref 0.44–1.00)
GFR, Estimated: 58 mL/min — ABNORMAL LOW (ref >60)
Glucose, Bld: 114 mg/dL — ABNORMAL HIGH (ref 70–99)
Potassium: 3.7 mmol/L (ref 3.5–5.1)
Sodium: 122 mmol/L — ABNORMAL LOW (ref 135–145)

## 2023-01-12 LAB — CBC
HCT: 27.4 % — ABNORMAL LOW (ref 36.0–46.0)
Hemoglobin: 9.8 g/dL — ABNORMAL LOW (ref 12.0–15.0)
MCH: 34.1 pg — ABNORMAL HIGH (ref 26.0–34.0)
MCHC: 35.8 g/dL (ref 30.0–36.0)
MCV: 95.5 fL (ref 80.0–100.0)
Platelets: 159 10*3/uL (ref 150–400)
RBC: 2.87 MIL/uL — ABNORMAL LOW (ref 3.87–5.11)
RDW: 12.9 % (ref 11.5–15.5)
WBC: 10.8 10*3/uL — ABNORMAL HIGH (ref 4.0–10.5)
nRBC: 0 % (ref 0.0–0.2)

## 2023-01-12 NOTE — TOC Progression Note (Signed)
Transition of Care [facility]) - Inpatient Brief Assessment  Patient Details  Name: April Weber MRN: [id number] Date of Birth: 03-Dec-1945  Transition of Care [facility]) CM/SW Contact:    Ewing Schlein, LCSW Phone Number: 01/12/2023, 10:54 AM  Clinical Narrative: Screening completed. No TOC needs identified at this time.  Transition of Care Asessment: Insurance and Status: Insurance coverage has been reviewed Patient has primary care physician: Yes Home environment has been reviewed: Lives alone Prior level of function:: Independent at baseline Prior/Current Home Services: No current home services Social Determinants of Health Reivew: SDOH reviewed no interventions necessary Readmission risk has been reviewed: Yes Transition of care needs: no transition of care needs at this time  Barriers to Discharge: Continued Medical Work up  Social Determinants of Health (SDOH) Interventions SDOH Screenings   Food Insecurity: Patient Declined (01/11/2023)  Housing: Patient Declined (01/11/2023)  Transportation Needs: Patient Declined (01/11/2023)  Utilities: Patient Declined (01/11/2023)  Alcohol Screen: Low Risk  (08/26/2021)  Depression (PHQ2-9): Low Risk  (11/30/2022)  Financial Resource Strain: Low Risk  (09/14/2022)  Physical Activity: Sufficiently Active (08/26/2021)  Social Connections: Moderately Integrated (09/14/2022)  Stress: No Stress Concern Present (09/14/2022)  Tobacco Use: Medium Risk (01/11/2023)   Readmission Risk Interventions    01/12/2023   10:52 AM 08/30/2022    2:22 PM 06/14/2022    2:06 PM  Readmission Risk Prevention Plan  Transportation Screening Complete Complete Complete  PCP or Specialist Appt within 3-5 Days  Complete Complete  HRI or Home Care Consult Complete Complete Complete  Social Work Consult for Recovery Care Planning/Counseling Complete Complete Complete  Palliative Care Screening Not Applicable Not Applicable Not Applicable  Medication Review Oceanographer) Complete  Complete Referral to Pharmacy

## 2023-01-12 NOTE — Anesthesia Postprocedure Evaluation (Signed)
Anesthesia Post Note  Patient: April Weber  Procedure(s) Performed: LOOP ILEOSTOMY REVERSAL     Patient location during evaluation: PACU Anesthesia Type: General Level of consciousness: awake and alert Pain management: pain level controlled Vital Signs Assessment: post-procedure vital signs reviewed and stable Respiratory status: spontaneous breathing, nonlabored ventilation, respiratory function stable and patient connected to nasal cannula oxygen Cardiovascular status: blood pressure returned to baseline and stable Postop Assessment: no apparent nausea or vomiting Anesthetic complications: no   No notable events documented.  Last Vitals:  Vitals:   01/12/23 0531 01/12/23 0912  BP: 128/73 (!) 125/59  Pulse: (!) 59 (!) 53  Resp: 15 10  Temp: 36.6 C 36.5 C  SpO2: 100% 100%    Last Pain:  Vitals:   01/12/23 0912  TempSrc: Oral  PainSc:                  Collene Schlichter

## 2023-01-12 NOTE — Progress Notes (Signed)
    1 Day Post-Op  Subjective: Pain with movement, tolerating liquids, +flatus  ROS: See above, otherwise other systems negative  Objective: Vital signs in last 24 hours: Temp:  [97.5 F (36.4 C)-98 F (36.7 C)] 97.8 F (36.6 C) (09/04 0531) Pulse Rate:  [57-74] 59 (09/04 0531) Resp:  [9-16] 15 (09/04 0531) BP: (127-157)/(61-82) 128/73 (09/04 0531) SpO2:  [91 %-100 %] 100 % (09/04 0531) Weight:  [62.6 kg] 62.6 kg (09/03 1120) Last BM Date : 01/10/23  Intake/Output from previous day: 09/03 0701 - 09/04 0700 In: 2081.2 [I.V.:1981.2; IV Piggyback:100] Out: 5 [Blood:5] Intake/Output this shift: No intake/output data recorded.  PE: Gen: NAd Resp: nonlabored CV: bradycardic Abd: soft, bandage with some seep through  Lab Results:  Recent Labs    01/11/23 1743 01/12/23 0430  WBC 10.8* 10.8*  HGB 12.1 9.8*  HCT 34.4* 27.4*  PLT 171 159   BMET Recent Labs    01/11/23 1743 01/12/23 0430  NA  --  122*  K  --  3.7  CL  --  95*  CO2  --  18*  GLUCOSE  --  114*  BUN  --  18  CREATININE 1.22* 1.00  CALCIUM  --  7.8*   PT/INR No results for input(s): "LABPROT", "INR" in the last 72 hours. CMP     Component Value Date/Time   NA 122 (L) 01/12/2023 0430   NA 135 11/25/2022 0935   K 3.7 01/12/2023 0430   CL 95 (L) 01/12/2023 0430   CO2 18 (L) 01/12/2023 0430   GLUCOSE 114 (H) 01/12/2023 0430   BUN 18 01/12/2023 0430   BUN 21 11/25/2022 0935   CREATININE 1.00 01/12/2023 0430   CREATININE 0.93 08/10/2022 0857   CREATININE 0.99 02/05/2013 0757   CALCIUM 7.8 (L) 01/12/2023 0430   PROT 6.6 11/29/2022 0749   PROT 6.5 11/25/2022 0935   ALBUMIN 4.1 11/29/2022 0749   ALBUMIN 4.2 11/25/2022 0935   AST 16 11/29/2022 0749   AST 12 (L) 08/10/2022 0857   ALT 12 11/29/2022 0749   ALT 11 08/10/2022 0857   ALKPHOS 51 11/29/2022 0749   BILITOT 0.5 11/29/2022 0749   BILITOT 0.5 11/25/2022 0935   BILITOT 0.3 08/10/2022 0857   GFRNONAA 58 (L) 01/12/2023 0430   GFRNONAA  >60 08/10/2022 0857   GFRNONAA 59 (L) 02/05/2013 0757   GFRAA 61 05/20/2020 0850   GFRAA 68 02/05/2013 0757   Lipase     Component Value Date/Time   LIPASE 13 08/23/2022 2013    Studies/Results: No results found.  Anti-infectives: Anti-infectives (From admission, onward)    Start     Dose/Rate Route Frequency Ordered Stop   01/11/23 1115  ceFAZolin (ANCEF) IVPB 2g/100 mL premix        2 g 200 mL/hr over 30 Minutes Intravenous On call to O.R. 01/11/23 1104 01/11/23 1340       Assessment/Plan    FEN - fulll liquids VTE - lovenox ID - no issues Dispo - await return of bowel function, repeat CBC tomorrow  I reviewed last 24 h vitals and pain scores, last 48 h intake and output, last 24 h labs and trends, and last 24 h imaging results.  This care required moderate level of medical decision making.    LOS: 1 day   [name] [facility] 01/12/2023, 8:35 AM Please see Amion for pager number during day hours 7:00am-4:30pm or 7:00am -11:30am on weekends

## 2023-01-13 ENCOUNTER — Encounter: Payer: Medicare Other | Admitting: *Deleted

## 2023-01-13 LAB — CBC
HCT: 32.6 % — ABNORMAL LOW (ref 36.0–46.0)
Hemoglobin: 11.3 g/dL — ABNORMAL LOW (ref 12.0–15.0)
MCH: 33.3 pg (ref 26.0–34.0)
MCHC: 34.7 g/dL (ref 30.0–36.0)
MCV: 96.2 fL (ref 80.0–100.0)
Platelets: 180 10*3/uL (ref 150–400)
RBC: 3.39 MIL/uL — ABNORMAL LOW (ref 3.87–5.11)
RDW: 13.2 % (ref 11.5–15.5)
WBC: 7.5 10*3/uL (ref 4.0–10.5)
nRBC: 0 % (ref 0.0–0.2)

## 2023-01-13 LAB — SURGICAL PATHOLOGY

## 2023-01-13 NOTE — Plan of Care (Signed)
  Problem: Education: Goal: Knowledge of General Education information will improve Description: Including pain rating scale, medication(s)/side effects and non-pharmacologic comfort measures Outcome: Progressing   Problem: Activity: Goal: Risk for activity intolerance will decrease Outcome: Progressing   Problem: Nutrition: Goal: Adequate nutrition will be maintained Outcome: Progressing   

## 2023-01-13 NOTE — Progress Notes (Signed)
Mobility Specialist - Progress Note   01/13/23 1104  Mobility  Activity Ambulated independently in hallway;Ambulated independently to bathroom  Level of Assistance Independent after set-up  Assistive Device None  Distance Ambulated (ft) 600 ft  Activity Response Tolerated well  Mobility Referral Yes  $Mobility charge 1 Mobility  Mobility Specialist Start Time (ACUTE ONLY) 1040  Mobility Specialist Stop Time (ACUTE ONLY) 1102  Mobility Specialist Time Calculation (min) (ACUTE ONLY) 22 min   Pt received in bed and agreeable to mobility. Prior to ambulating, pt requested assistance to bathroom. No complaints during session. Pt to bed after session with all needs met.    [facility]

## 2023-01-13 NOTE — Progress Notes (Signed)
Initial Nutrition Assessment  INTERVENTION:   -Ensure Plus High Protein po BID, each supplement provides 350 kcal and 20 grams of protein.   -Multivitamin with minerals daily  -Placed "Low Fiber Nutrition Therapy" in AVS  NUTRITION DIAGNOSIS:   Increased nutrient needs related to post-op healing as evidenced by estimated needs.  GOAL:   Patient will meet greater than or equal to 90% of their needs  MONITOR:   PO intake, Supplement acceptance, Weight trends, I & O's, Labs  REASON FOR ASSESSMENT:   Consult Diet education  ASSESSMENT:   77 y.o. female with PMHx  of osteoarthritis, right breast cancer, stage IIIa CKD, single kidney, history of DVT, dyspnea, hyperlipidemia, hypertension, osteopenia, peripheral vascular disease, seasonal allergies, hypothyroidism. S/p ileostomy creation during diverticulitis surgery 09/01/22. Patient admitted for planned ileostomy reversal 9/3.  Patient not in room at time of visit. Will attempt to speak with patient at later time.  Will go ahead and add low fiber diet information in AVS. Per chart review, pt has a regimented diet and has been struggling with weight loss.  Ensure supplements have been ordered, saw one at bedside in patient's room.   Per weight records, pt has lost 21 lb since January 2024 (~13% wt loss x 8 months, insignificant for time frame).  Medications: OSCAL w/ D, Multivitamin with minerals daily  Labs reviewed.  NUTRITION - FOCUSED PHYSICAL EXAM:  Unable to complete at this time.   Diet Order:   Diet Order             Diet Heart Room service appropriate? Yes; Fluid consistency: Thin  Diet effective now                   EDUCATION NEEDS:   Education needs have been addressed  Skin:  Skin Assessment: Skin Integrity Issues: Skin Integrity Issues:: Incisions Incisions: 9/3 abdomen  Last BM:  9/2  Height:   Ht Readings from Last 1 Encounters:  01/11/23 5' 6.5" (1.689 m)    Weight:   Wt Readings  from Last 1 Encounters:  01/11/23 62.6 kg    BMI:  Body mass index is 21.94 kg/m.  Estimated Nutritional Needs:   Kcal:  1800-2000  Protein:  85-95g  Fluid:  2L/day  Tilda Franco, MS, RD, LDN Inpatient Clinical Dietitian Contact information available via Amion

## 2023-01-13 NOTE — Discharge Instructions (Signed)
Low Fiber Nutrition Therapy   You may need a low-fiber diet if you have Crohn's disease, diverticulitis, gastroparesis, ulcerative colitis, a new colostomy, or new ileostomy. A low-fiber diet may also be needed following radiation therapy to the pelvis and lower bowel or recent intestinal surgery.  A low-fiber diet reduces the frequency and volume of your stools. This lessens irritation to the gastrointestinal (GI) tract and can help you heal. Use this diet if you have a stricture so your intestine doesn't get blocked. The goal of this diet is to get less than 8 grams of fiber daily. It's also important to eat enough protein foods while you are on a low-fiber diet.  Drink nutrition supplements that have 1 gram of fiber or less in each serving. If your stricture is severe or if your inflammation is severe, drink more liquids to reduce symptoms and to get enough calories and protein.  Tips Eat about 5 to 6 small meals daily or about every 3 to 4 hours. Do not skip meals.  Every time you eat, include a small amount of protein (1 to 2 ounces) plus an additional food. Low fiber starch foods are the best choice to eat with protein.  Limit acidic, spicy and high-fat or fried and greasy foods to reduce GI symptoms.  Do not eat raw fruits and vegetables while on this diet. All fruits and vegetables need to be cooked and without peels or skins.  Drink a lot of fluids, at least 8 cups of fluid each day. Limit drinks with caffeine, sugar, and sugar substitutes.  Plain water is the best choice. Avoid mixing drink packets or flavor drops into water. .  Take a chewable multivitamin with minerals. Gummy vitamins do not have enough minerals and can block an ostomy and non-chewable supplements are not easily digested. Chewable supplements must be used if you have a stricture or ostomy.  If you are lactose intolerant, you may need to eat low-lactose dairy products. If you can't tolerate dairy, ask your RDN about how  you can get enough calcium from other foods.  Do not take a calcium supplement. They can cause a blockage.  It is important to add high-calcium foods gradually to your diet and monitor for symptoms to avoid a blockage.  Do not add more fiber to your diet until your health care provider or registered dietitian nutritionist (RDN) tells you it's OK. Fiber is part of whole grains, fruits and vegetables (foods from plants) and needs to be slowly added back in to your diet when your body is healed.  Choose foods that have been safely handled and prepared to lower your risk of foodborne illness. Talk to your RDN or see the Food Safety Nutrition Therapy handout for more information.   Foods Recommended These foods are low in fat and fiber and will help with your GI symptoms. Food Group Foods Recommended  Grains  Choose grain foods with less than 2 grams of fiber per serving. Refined white flour products--for example, enriched white bread without seeds, crackers or pasta Cream of wheat or rice Grits (fine ground) Tortillas: white flour or corn White rice, well-cooked (do not rinse, or soak before cooking) Cold and hot cereals made from white or refined flour such as puffed rice or corn flakes  Protein Foods  Lean, very tender, well-cooked poultry or fish; red meats: beef, pork or lamb (slow cook until soft; chop meats if you have stricture or ostomy) Eggs, well-cooked Smooth nut butters such as almond,  peanut, or sunflower Tofu  Dairy  If you have lactose intolerance, drinking milk products from cows or goats may make diarrhea worse. Foods marked with an asterisk (*) have lactose. Milk: fat-free, 1% or 2% * (choose best tolerated) Lactose-free milk Buttermilk* Fortified non-dairy milks: almond, cashew, coconut, or rice (be aware that these options are not good sources of protein so you will need to eat an additional protein food) Kefir* (Don't include kefir in the diet until approved by your health  care provider) Yogurt*/lactose-free yogurt (without nuts, fruit, granola or chocolate) Mild cheese* (hard and aged cheeses tend to be lower in lactose such as cheddar, swiss or parmesan) Cottage cheese* or lactose-free cottage cheese Low-fat ice cream* or lactose-free ice cream Sherbet* (usually lower lactose)  Vegetables  Canned and well-cooked vegetables without seeds, skins, or hulls  Carrots or green beans, cooked White, red or yellow potatoes without skins Strained vegetable juice  Fruit Soft, and well-cooked fruits without skins, seeds, or membranes Canned fruit in juice: peaches, pears, or applesauce Fruit juice without pulp diluted by half with water may be tolerated better Fruit drinks fortified with vitamin C may be tolerated better than 100% fruit juice  Oils  When possible, choose healthy oils and fats, such as olive and canola oils, plant oils rather than solid fats.  Other  Broth and strained soups made from allowed foods Desserts (small portions) without whole grains, seeds, nuts, raisins, or coconut Jelly (clear)   Foods Not Recommended These foods are higher in fat and fiber and may make your GI symptoms worse.  Food Group Foods Not Recommended  Grains  Bread, whole wheat or with whole grain flour or seeds or nuts Brown rice, quinoa, kasha, barley Tortillas: whole grain Whole wheat pasta Whole grain and high-fiber cereals, including oatmeal, bran flakes or shredded wheat Popcorn  Protein Foods  Steak, pork chops, or other meats that are fatty or have gristle Fried meat, poultry, or fish Seafood with a tough or rubbery texture, such as shrimp Luncheon meats such as bologna and salami Sausage, bacon, or hot dogs Dried beans, peas, or lentils Hummus Sushi Nuts and chunky nut butters  Dairy  Whole milk Pea milk and soymilk (may cause diarrhea, gas, bloating, and abdominal pain) Cream Half-and-half Sour cream Yogurt with added fruit, nuts, or granola or chocolate   Vegetables  Alfalfa or bean sprouts (high fiber and risk for bacteria) Raw or undercooked vegetables: beets; broccoli; brussels sprouts; cabbage; cauliflower; collard, mustard, or turnip greens; corn; cucumber; green peas or any kind of peas; kale; lima beans; mushrooms; okra; olives; pickles and relish; onions; parsnips; peppers; potato skins; sauerkraut; spinach; tomatoes  Fruit Raw fruit Dried fruit Avocado, berries, coconut Canned fruit in syrup Canned fruit with mandarin oranges, papaya or pineapple Fruit juice with pulp Prune juice Fruit skin  Oils  Pork rinds   Low-Fiber (8 grams) Sample 1-Day Menu  Breakfast  cup cream of wheat (0.5 gram fiber)  1 slice white toast (1 gram fiber)  1 teaspoon margarine, soft tub  2 scrambled eggs   Morning Snack 1 cup lactose-free nutrition supplement  Lunch 2 slices white bread (2 grams fiber)  3 tablespoons tuna  1 tablespoon mayonnaise  1 cup chicken noodle soup (1 gram fiber)   cup apple juice   Afternoon Snack 6 saltine crackers (0.5 gram fiber)  2 ounces low-fat cheddar cheese  Evening Meal 3 ounces tender chicken breast  1 cup white rice (0.5 gram fiber)   cup  cooked canned green beans (2 grams fiber)   cup cranberry juice   Evening Snack 1 cup lactose-free nutrition supplement   Copyright 2020  Academy of Nutrition and Dietetics

## 2023-01-14 LAB — CBC
HCT: 30 % — ABNORMAL LOW (ref 36.0–46.0)
Hemoglobin: 10.3 g/dL — ABNORMAL LOW (ref 12.0–15.0)
MCH: 33 pg (ref 26.0–34.0)
MCHC: 34.3 g/dL (ref 30.0–36.0)
MCV: 96.2 fL (ref 80.0–100.0)
Platelets: 173 10*3/uL (ref 150–400)
RBC: 3.12 MIL/uL — ABNORMAL LOW (ref 3.87–5.11)
RDW: 13.2 % (ref 11.5–15.5)
WBC: 5.5 10*3/uL (ref 4.0–10.5)
nRBC: 0 % (ref 0.0–0.2)

## 2023-01-14 MED ORDER — TRAMADOL HCL 50 MG PO TABS: 50.0000 mg | ORAL_TABLET | Freq: Three times a day (TID) | ORAL | Status: AC | PRN

## 2023-01-14 NOTE — Progress Notes (Signed)
Discharge instructions discussed with patient and family, verbalized agreement and understanding 

## 2023-01-14 NOTE — Progress Notes (Signed)
Nutrition Note  RD consulted for nutrition education regarding Nutrition Management for Diverticulosis/Diverticulitis.  RD provided both "Low Fiber Nutrition Therapy" from the Academy of Nutrition and Dietetics. Reviewed home diet with pt and suggested ways to meet nutrition goals over the next several weeks. Explained reasons to follow Low Fiber diet for the next 4 weeks and discussed ways to achieve. Encouraged fluid intake. Discussed symptom management should abdominal pain occur.  Pt verbalizes understanding of information provided. Expect good compliance. Pt used to being on diet regimen. Has struggled with diarrhea and diverticulitis as well as receiving cancer treatment since November 2023. Spent extensive time reviewing diet with patient and pt expressed good understanding. Would like to have outpatient education as well after follow ups with surgery and GI consultation. Placed outpatient referral for outpatient RD visit.   Body mass index is 23.91 kg/m^2. Pt meets criteria for normal based on current BMI.  Current diet order is Heart Healthy, patient is consuming approximately 75% of meals at this time. Labs and medications reviewed. No further nutrition interventions warranted at this time. RD contact information provided. If additional nutrition issues arise, please re-consult RD.  Tilda Franco, MS, RD, LDN Inpatient Clinical Dietitian Contact information available via Amion

## 2023-01-14 NOTE — Discharge Summary (Signed)
Physician Discharge Summary  April Weber JWJ:[id number] DOB: [birth year] DOA: 01/11/2023  PCP: April Ip, DO  Admit date: 01/11/2023 Discharge date:  01/14/2023   Recommendations for Outpatient Follow-up:   (include homehealth, outpatient follow-up instructions, specific recommendations for PCP to follow-up on, etc.)   Follow-up Information     April Weber, [name], MD Follow up on 02/02/2023.   Specialty: General Surgery Contact information: [address] [place] [address] [phone]                Discharge Diagnoses:  Principal Problem:   Diverticulitis large intestine   Surgical Procedure: loop ileostomy reversal  Discharge Condition: Good Disposition: Home  Diet recommendation: low fiber for 1 month   Hospital Course:  77 yo female underwent loop ileostomy reversal. Post op she did well. She had diarrhea POD 1. This improved and she was discharged home POD 3.  Discharge Instructions  Discharge Instructions     Diet - low sodium heart healthy   Complete by: As directed    Discharge wound care:   Complete by: As directed    Shower normal tomorrow. Glue to stay on for 10-14 days. No bandage needed.   Increase activity slowly   Complete by: As directed       Allergies as of 01/14/2023       Reactions   Paclitaxel Anaphylaxis   Emend [fosaprepitant Dimeglumine] Other (See Comments)   3 minutes into emend infusion patient began having facial flushing and redness to bilateral hands and neck, and light headedness   Codeine Nausea And Vomiting   Nsaids Other (See Comments)   Told to avoid due to kidney function   Penicillins Hives   Vibra-tab [doxycycline] Hives   Iodine Rash   Latex Rash   Occasionally gets a localized rash on contact with certain latex products        Medication List     TAKE these medications    acetaminophen 500 MG tablet Commonly known as: TYLENOL Take 2 tablets (1,000 mg total) by mouth  every 6 (six) hours as needed for mild pain.   amLODipine 5 MG tablet Commonly known as: NORVASC Take 1 tablet (5 mg total) by mouth daily.   aspirin EC 81 MG tablet Take 81 mg by mouth at bedtime. Swallow whole.   atenolol 25 MG tablet Commonly known as: TENORMIN Take 0.5 tablets (12.5 mg total) by mouth 2 (two) times daily.   CALCIUM + VITAMIN D3 PO Take 1 tablet by mouth 2 (two) times daily.   Ensure Take 237 mLs by mouth 2 (two) times daily between meals.   EPINEPHrine 0.3 mg/0.3 mL Soaj injection Commonly known as: EPI-PEN Inject 0.3 mg into the muscle as needed for anaphylaxis.   levothyroxine 100 MCG tablet Commonly known as: SYNTHROID Take 1 tablet (100 mcg total) by mouth in the morning.   multivitamin tablet Take 1 tablet by mouth in the morning.   olaparib 100 MG tablet Commonly known as: LYNPARZA Take 1 tablet (100 mg total) by mouth 2 (two) times daily. Swallow whole. May take with food to decrease nausea and vomiting.   ondansetron 4 MG tablet Commonly known as: ZOFRAN Take 1 tablet (4 mg total) by mouth every 6 (six) hours as needed for nausea.   simvastatin 20 MG tablet Commonly known as: ZOCOR Take 1 tablet (20 mg total) by mouth daily.   traMADol 50 MG tablet Commonly known as: ULTRAM Take 1 tablet (50 mg  total) by mouth every 8 (eight) hours as needed for up to 5 days.               Discharge Care Instructions  (From admission, onward)           Start     Ordered   01/14/23 0000  Discharge wound care:       Comments: Shower normal tomorrow. Glue to stay on for 10-14 days. No bandage needed.   01/14/23 0750            Follow-up Information     April Weber, [name], MD Follow up on 02/02/2023.   Specialty: General Surgery Contact information: [address] [place] [address] [phone]                  The results of significant diagnostics from this hospitalization (including imaging,  microbiology, ancillary and laboratory) are listed below for reference.    Significant Diagnostic Studies: No results found.  Labs: Basic Metabolic Panel: Recent Labs  Lab 01/11/23 1743 01/12/23 0430  NA  --  122*  K  --  3.7  CL  --  95*  CO2  --  18*  GLUCOSE  --  114*  BUN  --  18  CREATININE 1.22* 1.00  CALCIUM  --  7.8*   Liver Function Tests: No results for input(s): "AST", "ALT", "ALKPHOS", "BILITOT", "PROT", "ALBUMIN" in the last 168 hours.  CBC: Recent Labs  Lab 01/11/23 1743 01/12/23 0430 01/13/23 0437 01/14/23 0436  WBC 10.8* 10.8* 7.5 5.5  HGB 12.1 9.8* 11.3* 10.3*  HCT 34.4* 27.4* 32.6* 30.0*  MCV 96.4 95.5 96.2 96.2  PLT 171 159 180 173    CBG: No results for input(s): "GLUCAP" in the last 168 hours.  Principal Problem:   Diverticulitis large intestine   Time coordinating discharge: 15 min

## 2023-01-17 ENCOUNTER — Telehealth: Payer: Self-pay

## 2023-01-17 NOTE — Transitions of Care (Post Inpatient/ED Visit) (Signed)
01/17/2023  Name: April Weber MRN: [id number] DOB: 1945/06/10  Today's TOC FU Call Status: Today's TOC FU Call Status:: Successful TOC FU Call Completed TOC FU Call Complete Date: 01/14/23 Patient's Name and Date of Birth confirmed.  Transition Care Management Follow-up Telephone Call Date of Discharge: 01/14/23 Discharge Facility: [facility]) Type of Discharge: Inpatient Admission Primary Inpatient Discharge Diagnosis:: Acute Diverticulitis How have you been since you were released from the hospital?: Better Any questions or concerns?: No  Items Reviewed: Did you receive and understand the discharge instructions provided?: Yes Medications obtained,verified, and reconciled?: Yes (Medications Reviewed) Any new allergies since your discharge?: No Dietary orders reviewed?: Yes Type of Diet Ordered:: Low Fiber Do you have support at home?: Yes People in Home: friend(s)  Medications Reviewed Today: Medications Reviewed Today     Reviewed by Jodelle Gross, RN (Case Manager) on 01/17/23 at 1618  Med List Status: <None>   Medication Order Taking? Sig Documenting Provider Last Dose Status Informant  acetaminophen (TYLENOL) 500 MG tablet [id number] Yes Take 2 tablets (1,000 mg total) by mouth every 6 (six) hours as needed for mild pain. Tyrone Nine, MD Taking Active Self  amLODipine (NORVASC) 5 MG tablet [id number] Yes Take 1 tablet (5 mg total) by mouth daily. Raliegh Ip, DO Taking Active Self  aspirin EC 81 MG tablet [id number] Yes Take 81 mg by mouth at bedtime. Swallow whole. [provider] Taking Active Self  atenolol (TENORMIN) 25 MG tablet [id number] Yes Take 0.5 tablets (12.5 mg total) by mouth 2 (two) times daily. Raliegh Ip, DO Taking Active Self           Med Note Kimber Relic Dec 20, 2022  3:21 PM)    Calcium Carb-Cholecalciferol (CALCIUM + VITAMIN D3 PO) [id number] Yes Take 1 tablet by mouth 2 (two) times daily. [provider] Taking Active Self  Ensure [facility]) [id number] Yes Take 237 mLs by mouth 2 (two) times daily between meals. [provider] Taking Active Self  EPINEPHrine 0.3 mg/0.3 mL IJ SOAJ injection [id number] No Inject 0.3 mg into the muscle as needed for anaphylaxis. Daphine Deutscher, Mary-Margaret, FNP Unknown Active Self  levothyroxine (SYNTHROID) 100 MCG tablet [id number] Yes Take 1 tablet (100 mcg total) by mouth in the morning. Delynn Flavin M, DO Taking Active Self  Multiple Vitamin (MULTIVITAMIN) tablet [id number] Yes Take 1 tablet by mouth in the morning. [provider] Taking Active Self  olaparib (LYNPARZA) 100 MG tablet [id number] Yes Take 1 tablet (100 mg total) by mouth 2 (two) times daily. Swallow whole. May take with food to decrease nausea and vomiting. [name], MD Taking Active Self  ondansetron (ZOFRAN) 4 MG tablet [id number] Yes Take 1 tablet (4 mg total) by mouth every 6 (six) hours as needed for nausea. Tyrone Nine, MD Taking Active Self  simvastatin (ZOCOR) 20 MG tablet [id number] Yes Take 1 tablet (20 mg total) by mouth daily. Raliegh Ip, DO Taking Active Self           Med Note Kimber Relic Dec 20, 2022  3:23 PM) In the evening  traMADol (ULTRAM) 50 MG tablet [id number] No Take 1 tablet (50 mg total) by mouth every 8 (eight) hours as needed for up to 5 days.  Patient not taking: Reported on 01/17/2023   [name], [name], MD Not Taking Active             Home Care and Equipment/Supplies:  Were Home Health Services Ordered?: No Any new equipment or medical supplies ordered?: No  Functional Questionnaire: Do you need assistance with bathing/showering or dressing?: No Do you need assistance with meal preparation?: No Do you need assistance with eating?: No Do you have difficulty maintaining continence: No Do you need assistance with getting out of bed/getting out of a chair/moving?: No Do you have difficulty managing or taking  your medications?: No  Follow up appointments reviewed: PCP Follow-up appointment confirmed?: NA Specialist Hospital Follow-up appointment confirmed?: Yes Date of Specialist follow-up appointment?: 01/21/23 Follow-Up Specialty Provider:: Dr. Bertis Ruddy (Oncology) Do you need transportation to your follow-up appointment?: No Do you understand care options if your condition(s) worsen?: Yes-patient verbalized understanding   TOC Interventions Today    Flowsheet Row Most Recent Value  TOC Interventions   TOC Interventions Discussed/Reviewed TOC Interventions Discussed, TOC Interventions Reviewed  [Reminded patient of her appointment with Edd Arbour, RNCC]     Jodelle Gross RN, BSN, CCM [facility] RN Care Coordinator/ Transitions of Care Direct Dial: [phone]  Fax: [phone]

## 2023-01-17 NOTE — Telephone Encounter (Signed)
Returned her call. She is asking if she should keep appt on 9/13. She is supposed to start lynparza on 9/10, 10 days after surgery.  Told her I would ask Dr. Bertis Ruddy and call her tomorrow.

## 2023-01-18 ENCOUNTER — Ambulatory Visit: Payer: Self-pay | Admitting: *Deleted

## 2023-01-18 NOTE — Telephone Encounter (Signed)
Yes, restart on 9/13

## 2023-01-18 NOTE — Telephone Encounter (Signed)
Pls reschedule to 3 weeks from start date of chemo Thanks

## 2023-01-18 NOTE — Patient Outreach (Signed)
  Care Coordination   Follow Up Visit Note   08/12/2023 updated note for 01/18/23 Name: April Weber MRN: [id number] DOB: [birth year]  April Weber is a 77 y.o. year old female who sees Raliegh Ip, DO for primary care. I spoke with  April Weber by phone today.  What matters to the patients health and wellness today?  Reports she is doing fine,Had an admission for loop ileostomy reversal, diarrhea, soreness, does not know what & when to eat/was not seen by a nutritionist prior to leaving the hospital   Diarrhea -Had at lest 8 stools today She will call her MD on 9/11/124 if she continues on tomorrow    Nutritionist next available visit is in December 2024 in [place]  voiced understanding of the BRAT diet (Banana, rice, applesauce, toast) Confirm she is able to get fluid supplements like ensure and Gatorade  She confirm she will be seeing her new gastroenterology and surgery MDs and will ask for nutritional information Voiced understanding of symptoms to watch out for like abdominal pain, bloating,  Voiced understanding of possible support groups to discussed   Goals Addressed             This Visit's Progress    Patient will have improved bowel elimination-THN care coordination services   Not on track    Interventions Today    Flowsheet Row Most Recent Value  Chronic Disease   Chronic disease during today's visit Diabetes, Other  [when & what to eat after recent ileostomy reversal, Diarrhea]  General Interventions   General Interventions Discussed/Reviewed General Interventions Reviewed, Sick Day Rules, [facility], Doctor Visits  Doctor Visits Discussed/Reviewed Doctor Visits Reviewed, PCP, Specialist  PCP/Specialist Visits Compliance with follow-up visit  Education Interventions   Education Provided Provided Education  Provided Verbal Education On Nutrition, Sick Day Rules, Other  [managing loose stools, Food to take in after ileostomy reversal]   Mental Health Interventions   Mental Health Discussed/Reviewed Mental Health Reviewed, Coping Strategies  Nutrition Interventions   Nutrition Discussed/Reviewed Nutrition Reviewed, Carbohydrate meal planning, Fluid intake, Portion sizes  [BRAT (Banana, rice, applesauce, Toast) diet]  Pharmacy Interventions   Pharmacy Dicussed/Reviewed Pharmacy Topics Reviewed, Affording Medications  Safety Interventions   Safety Discussed/Reviewed Safety Reviewed, Fall Risk, Home Safety  Home Safety Assistive Devices              SDOH assessments and interventions completed:  No     Care Coordination Interventions:  Yes, provided   Follow up plan: No further intervention required.   Encounter Outcome:  Patient Visit Completed   April Bradford L. Noelle Penner, RN, BSN, CCM, Care Management Coordinator [phone]

## 2023-01-18 NOTE — Telephone Encounter (Signed)
Called to move appts. She now is unsure when she was told to restart [place]. Should she restart 10 days after surgery, which would be 9/13?

## 2023-01-18 NOTE — Telephone Encounter (Signed)
Called and given below message. She verbalized understanding. Appts scheduled on 10/4 and she is aware of appts.

## 2023-01-21 ENCOUNTER — Inpatient Hospital Stay: Payer: Medicare Other | Admitting: Hematology and Oncology

## 2023-01-21 ENCOUNTER — Inpatient Hospital Stay: Payer: Medicare Other

## 2023-01-25 ENCOUNTER — Ambulatory Visit (INDEPENDENT_AMBULATORY_CARE_PROVIDER_SITE_OTHER): Payer: Medicare Other

## 2023-01-25 VITALS — Ht 66.0 in | Wt 140.0 lb

## 2023-01-25 DIAGNOSIS — Z0000 Encounter for general adult medical examination without abnormal findings: Secondary | ICD-10-CM

## 2023-01-25 NOTE — Progress Notes (Signed)
Subjective:   April Weber is a 77 y.o. female who presents for Medicare Annual (Subsequent) preventive examination.  Visit Complete: Virtual  I connected with  April Weber on 01/25/23 by a audio enabled telemedicine application and verified that I am speaking with the correct person using two identifiers.  Patient Location: Home  Provider Location: Home Office  I discussed the limitations of evaluation and management by telemedicine. The patient expressed understanding and agreed to proceed.  Patient Medicare AWV questionnaire was completed by the patient on 08/25/2022; I have confirmed that all information answered by patient is correct and no changes since this date. Vital Signs: Unable to obtain new vitals due to this being a telehealth visit.  Cardiac Risk Factors include: advanced age (>74men, >23 women)     Objective:    Today's Vitals   01/25/23 0900  Weight: 140 lb (63.5 kg)  Height: 5\' 6"  (1.676 m)   Body mass index is 22.6 kg/m.     01/25/2023    9:03 AM 01/11/2023   11:21 AM 12/29/2022    9:19 AM 08/27/2022   12:31 PM 08/27/2022    7:45 AM 08/23/2022    6:42 PM 06/13/2022    6:28 AM  Advanced Directives  Does Patient Have a Medical Advance Directive? Yes Yes Yes No No Yes Yes  Type of Estate agent of [place];Living will Healthcare Power of [place];Living will Healthcare Power of [place];Living will   Living will Living will  Does patient want to make changes to medical advance directive? No - Patient declined No - Patient declined     No - Guardian declined  Copy of Healthcare Power of Attorney in Chart? Yes - validated most recent copy scanned in chart (See row information) Yes - validated most recent copy scanned in chart (See row information) Yes - validated most recent copy scanned in chart (See row information)      Would patient like information on creating a medical advance directive?    No - Patient declined No - Patient  declined      Current Medications (verified) Outpatient Encounter Medications as of 01/25/2023  Medication Sig   acetaminophen (TYLENOL) 500 MG tablet Take 2 tablets (1,000 mg total) by mouth every 6 (six) hours as needed for mild pain.   amLODipine (NORVASC) 5 MG tablet Take 1 tablet (5 mg total) by mouth daily.   aspirin EC 81 MG tablet Take 81 mg by mouth at bedtime. Swallow whole.   atenolol (TENORMIN) 25 MG tablet Take 0.5 tablets (12.5 mg total) by mouth 2 (two) times daily.   Calcium Carb-Cholecalciferol (CALCIUM + VITAMIN D3 PO) Take 1 tablet by mouth 2 (two) times daily.   Ensure (ENSURE) Take 237 mLs by mouth 2 (two) times daily between meals.   EPINEPHrine 0.3 mg/0.3 mL IJ SOAJ injection Inject 0.3 mg into the muscle as needed for anaphylaxis.   levothyroxine (SYNTHROID) 100 MCG tablet Take 1 tablet (100 mcg total) by mouth in the morning.   Multiple Vitamin (MULTIVITAMIN) tablet Take 1 tablet by mouth in the morning.   olaparib (LYNPARZA) 100 MG tablet Take 1 tablet (100 mg total) by mouth 2 (two) times daily. Swallow whole. May take with food to decrease nausea and vomiting.   ondansetron (ZOFRAN) 4 MG tablet Take 1 tablet (4 mg total) by mouth every 6 (six) hours as needed for nausea.   simvastatin (ZOCOR) 20 MG tablet Take 1 tablet (20 mg total) by mouth daily.  No facility-administered encounter medications on file as of 01/25/2023.    Allergies (verified) Paclitaxel, Emend [fosaprepitant dimeglumine], Codeine, Nsaids, Penicillins, Vibra-tab [doxycycline], Iodine, and Latex   History: Past Medical History:  Diagnosis Date   Arthritis    Breast cancer (HCC) 1980   RIGHT   Chronic kidney disease    has one kidney   Diverticulitis of colon    DVT (deep venous thrombosis) (HCC)    on RIGHT leg-hx of, superficial   Dyspnea    Headache    occ migraine   Hyperlipidemia    on meds   Hypertension    on meds   Osteopenia    Peripheral vascular disease (HCC)    PONV  (postoperative nausea and vomiting)    Seasonal allergies    Thyroid disease    on meds   Past Surgical History:  Procedure Laterality Date   ABDOMINAL HYSTERECTOMY     APPENDECTOMY     BLADDER REPAIR  09/01/2022   Procedure: REPAIR BLADDER FISTULA WITH HYDRODISTENTION WITH METHYLINE BLUE;  Surgeon: Heloise Purpura, MD;  Location: [facility];  Service: Urology;;   BREAST BIOPSY     BREAST LUMPECTOMY WITH RADIOACTIVE SEED LOCALIZATION Left 12/01/2021   Procedure: LEFT BREAST RADIOACTIVE SEED GUIDED LUMPECTOMY;  Surgeon: Abigail Miyamoto, MD;  Location: [facility];  Service: General;  Laterality: Left;  LMA   COLON RESECTION N/A 09/01/2022   Procedure: LAPAROSCOPIC SIGMOIDECTOMY CONVERTED TO OPEN, TAKEDOWN OF COLOVESICAL FISTULA AND LOOP COLOSTOMY;  Surgeon: Sheliah Hatch [name], MD;  Location: [facility];  Service: General;  Laterality: N/A;   COLONOSCOPY  2011   Dr.[email or web address]-[email or web address]   CYSTOSCOPY WITH STENT PLACEMENT  09/01/2022   Procedure: CYSTOSCOPY WITH BILATERAL URETERAL STENT PLACEMENTS;  Surgeon: Heloise Purpura, MD;  Location: [facility];  Service: Urology;;   DEBULKING N/A 03/30/2022   Procedure: TUMOR DEBULKING;  Surgeon: Carver Fila, MD;  Location: [facility];  Service: Gynecology;  Laterality: N/A;   EYE SURGERY     77 years old   ILEOSTOMY CLOSURE N/A 01/11/2023   Procedure: LOOP ILEOSTOMY REVERSAL;  Surgeon: Sheliah Hatch [name], MD;  Location: [facility];  Service: General;  Laterality: N/A;   IR IMAGING GUIDED PORT INSERTION  03/19/2022   IR REMOVAL TUN ACCESS W/ PORT W/O FL MOD SED  11/15/2022   LAPAROSCOPY N/A 03/30/2022   Procedure: LAPAROSCOPY DIAGNOSTIC;  Surgeon: Carver Fila, MD;  Location: [facility];  Service: Gynecology;  Laterality: N/A;   MASTECTOMY Right 1984   ROTATOR CUFF REPAIR     SALPINGOOPHORECTOMY Bilateral 03/30/2022   Procedure: OPEN SALPINGO OOPHORECTOMY;  Surgeon: Carver Fila, MD;  Location: [facility];  Service: Gynecology;  Laterality:  Bilateral;   THYROID SURGERY Bilateral 1967   VARICOSE VEIN SURGERY     ablation   WISDOM TOOTH EXTRACTION     Family History  Problem Relation Age of Onset   Heart attack Mother    Alcohol abuse Mother    Lung cancer Mother        d. 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  Tobacco Use   Smoking status: Former    Current packs/day: 0.00    Types: Cigarettes    Quit date: 05/11/1967    Years since quitting: 55.7    Passive exposure: Past   Smokeless tobacco: Never  Vaping Use   Vaping status: Never Used  Substance and Sexual Activity   Alcohol use: No   Drug use: No   Sexual activity: Not Currently    Birth control/protection: Post-menopausal, Surgical    Comment: hyst  Other Topics Concern   Not on file  Social History Narrative   Lives alone - very close with her neighbors   Social Determinants of Health   Financial Resource Strain: Low Risk  (01/25/2023)   Overall Financial Resource Strain (CARDIA)    Difficulty of Paying Living Expenses: Not hard at all  Food Insecurity: No Food Insecurity (01/25/2023)   Hunger Vital Sign    Worried About Running Out of Food in the Last Year: Never true    Ran Out of Food in the Last Year: Never true  Transportation Needs: No Transportation Needs (01/25/2023)   PRAPARE - Administrator, Civil Service (Medical): No    Lack of Transportation (Non-Medical): No  Physical Activity: Insufficiently Active (01/25/2023)   Exercise Vital Sign    Days of Exercise per Week: 3 days    Minutes of  Exercise per Session: 30 min  Stress: No Stress Concern Present (01/25/2023)   [employer] of Occupational Health - Occupational Stress Questionnaire    Feeling of Stress : Not at all  Social Connections: Unknown (01/25/2023)   Social Connection and Isolation Panel [NHANES]    Frequency of Communication with Friends and Family: More than three times a week    Frequency of Social Gatherings with Friends and Family: More than three times a week    Attends Religious Services: More than 4 times per year    Active Member of [employer] or Organizations: Yes    Attends Engineer, structural: More than 4 times per year    Marital Status: Patient declined    Tobacco Counseling Counseling given: Not Answered   Clinical Intake:  Pre-visit preparation completed: Yes  Pain : No/denies pain     Nutritional Risks: None Diabetes: No  How often do you need to have someone help you when you read instructions, pamphlets, or other written materials from your doctor or pharmacy?: 1 - Never  Interpreter Needed?: No  Information entered by :: Renie Ora, LPN   Activities of Daily Living    01/25/2023    9:04 AM 01/11/2023    5:18 PM  In your present state of health, do you have any difficulty performing the following activities:  Hearing? 0 0  Vision? 0 0  Difficulty concentrating or making decisions? 0 0  Walking or climbing stairs? 0 0  Dressing or bathing? 0 0  Doing errands, shopping? 0 0  Preparing Food and eating ? N   Using the Toilet? N   In the past six months, have you accidently leaked urine? N   Do you have problems with loss of bowel control? N   Managing your Medications? N   Managing your Finances? N   Housekeeping or managing your Housekeeping? N     [name], DO as PCP -  General (Family Medicine) [name], MD as Attending Physician (Obstetrics and Gynecology) [name], MD as Consulting Physician (Family  Medicine) [name], OD (Optometry) [name], RN as [facility] Care Management Care, [facility] Services) [name], MD as Consulting Physician (Hematology and Oncology) [name], [name], MD as Consulting Physician (General Surgery) Noreene Larsson, RD as Dietitian (Dietician)  Indicate any recent Medical Services you may have received from other than [facility] providers in the past year (date may be approximate).     Assessment:   This is a routine wellness examination for April Weber.  Hearing/Vision screen Vision Screening - Comments:: Wears rx glasses - up to date with routine eye exams with  Dr.Johnson    Goals Addressed             This Visit's Progress    Patient Stated   On track    Would like to stay healthy and active Goals Addressed             This Visit's Progress    Patient Stated       Would like to stay healthy and active              Depression Screen    01/25/2023    9:02 AM 11/30/2022    7:54 AM 06/01/2022    8:05 AM 05/24/2022    8:12 AM 01/22/2022   10:01 AM 11/16/2021    8:13 AM 09/15/2021   11:08 AM  PHQ 2/9 Scores  PHQ - 2 Score 0 0 0 0 0 0 0  PHQ- 9 Score 0 0 0 0       Fall Risk    01/25/2023    9:01 AM 11/30/2022    7:54 AM 06/01/2022    8:05 AM 05/24/2022    8:12 AM 03/25/2022    3:47 PM  Fall Risk   Falls in the past year? 0 0 0 0 0  Number falls in past yr: 0 0 0    Injury with Fall? 0 0 0    Risk for fall due to : No Fall Risks No Fall Risks No Fall Risks    Follow up Falls prevention discussed Education provided Education provided      MEDICARE RISK AT HOME: Medicare Risk at Home Any stairs in or around the home?: No If so, are there any without handrails?: No Home free of loose throw rugs in walkways, pet beds, electrical cords, etc?: Yes Adequate lighting in your home to reduce risk of falls?: Yes Life alert?: No Use of a cane, walker or w/c?: No Grab bars in the  bathroom?: Yes Shower chair or bench in shower?: No Elevated toilet seat or a handicapped toilet?: No  TIMED UP AND GO:  Was the test performed?  No    Cognitive Function:    08/14/2014    2:42 PM  MMSE - Mini Mental State Exam  Orientation to time 5  Orientation to Place 5  Registration 3  Attention/ Calculation 5  Recall 3  Language- name 2 objects 2  Language- repeat 1  Language- follow 3 step command 3  Language- read & follow direction 1  Write a sentence 1  Copy design 1  Total score 30        01/25/2023    9:04 AM 08/26/2021    2:48 PM  6CIT Screen  What Year? 0 points 0 points  What month? 0 points 0 points  What time? 0 points 0 points  Count back from 20 0 points 0 points  Months in reverse 0 points 0 points  Repeat phrase 0 points 0 points  Total Score 0 points 0 points    Immunizations Immunization History  Administered Date(s) Administered   COVID-19, mRNA, vaccine(Comirnaty)12 years and older 02/16/2022   Fluad Quad(high Dose 65+) 02/09/2019, 02/13/2020, 02/18/2021, 02/23/2022   Influenza Whole 02/16/2010   Influenza, High Dose Seasonal PF 02/12/2015, 03/05/2016, 03/01/2017, 02/17/2018   Influenza,inj,Quad PF,6+ Mos 02/13/2013, 02/13/2014   Moderna Sars-Covid-2 Vaccination 06/15/2019, 07/14/2019, 03/11/2020, 08/19/2020, 01/27/2021   Pneumococcal Conjugate-13 08/14/2014   Pneumococcal Polysaccharide-23 10/24/2009   Td 01/09/2019, 01/09/2019   Tdap 08/28/2008   Unspecified SARS-COV-2 Vaccination 08/19/2020   Zoster, Live 03/23/2007    TDAP status: Up to date  Flu Vaccine status: Up to date  Pneumococcal vaccine status: Up to date  Covid-19 vaccine status: Completed vaccines  Qualifies for Shingles Vaccine? Yes   Zostavax completed No   Shingrix Completed?: No.    Education has been provided regarding the importance of this vaccine. Patient has been advised to call insurance company to determine out of pocket expense if they have not yet  received this vaccine. Advised may also receive vaccine at local pharmacy or Health Dept. Verbalized acceptance and understanding.  Screening Tests Health Maintenance  Topic Date Due   INFLUENZA VACCINE  12/09/2022   COVID-19 Vaccine (8 - 2023-24 season) 01/09/2023   Zoster Vaccines- Shingrix (1 of 2) 03/02/2023 (Originally 10/14/1964)   MAMMOGRAM  06/02/2023 (Originally 10/12/2022)   Medicare Annual Wellness (AWV)  01/25/2024   DEXA SCAN  12/01/2024   DTaP/Tdap/Td (4 - Td or Tdap) 01/08/2029   Pneumonia Vaccine 32+ Years old  Completed   Hepatitis C Screening  Completed   HPV VACCINES  Aged Out   Colonoscopy  Discontinued    Health Maintenance  Health Maintenance Due  Topic Date Due   INFLUENZA VACCINE  12/09/2022   COVID-19 Vaccine (8 - 2023-24 season) 01/09/2023    Colorectal cancer screening: No longer required.   Mammogram status: No longer required due to age.  Bone Density status: Completed 12/02/2022. Results reflect: Bone density results: OSTEOPOROSIS. Repeat every 2 years.  Lung Cancer Screening: (Low Dose CT Chest recommended if Age 25-80 years, 20 pack-year currently smoking OR have quit w/in 15years.) does not qualify.   Lung Cancer Screening Referral: n/a  Additional Screening:  Hepatitis C Screening: does not qualify; Completed 03/17/2022  Vision Screening: Recommended annual ophthalmology exams for early detection of glaucoma and other disorders of the eye. Is the patient up to date with their annual eye exam?  Yes  Who is the provider or what is the name of the office in which the patient attends annual eye exams? Dr.johnson  If pt is not established with a provider, would they like to be referred to a provider to establish care? No .   Dental Screening: Recommended annual dental exams for proper oral hygiene   Community Resource Referral / Chronic Care Management: CRR required this visit?  No   CCM required this visit?  No     Plan:     I have  personally reviewed and noted the following in the patient's chart:   Medical and social history Use of alcohol, tobacco or illicit drugs  Current medications and supplements including opioid prescriptions. Patient is not currently taking opioid prescriptions. Functional ability and status Nutritional status Physical activity Advanced directives List of other physicians Hospitalizations,  surgeries, and ER visits in previous 12 months Vitals Screenings to include cognitive, depression, and falls Referrals and appointments  In addition, I have reviewed and discussed with patient certain preventive protocols, quality metrics, and best practice recommendations. A written personalized care plan for preventive services as well as general preventive health recommendations were provided to patient.     Lorrene Reid, LPN   [id number]   After Visit Summary: (MyChart) Due to this being a telephonic visit, the after visit summary with patients personalized plan was offered to patient via MyChart   Nurse Notes: none

## 2023-01-25 NOTE — Patient Instructions (Signed)
April Weber , Thank you for taking time to come for your Medicare Wellness Visit. I appreciate your ongoing commitment to your health goals. Please review the following plan we discussed and let me know if I can assist you in the future.   Referrals/Orders/Follow-Ups/Clinician Recommendations: Aim for 30 minutes of exercise or brisk walking, 6-8 glasses of water, and 5 servings of fruits and vegetables each day.   This is a list of the screening recommended for you and due dates:  Health Maintenance  Topic Date Due   Flu Shot  12/09/2022   COVID-19 Vaccine (8 - 2023-24 season) 01/09/2023   Zoster (Shingles) Vaccine (1 of 2) 03/02/2023*   Mammogram  06/02/2023*   Medicare Annual Wellness Visit  01/25/2024   DEXA scan (bone density measurement)  12/01/2024   DTaP/Tdap/Td vaccine (4 - Td or Tdap) 01/08/2029   Pneumonia Vaccine  Completed   Hepatitis C Screening  Completed   HPV Vaccine  Aged Out   Colon Cancer Screening  Discontinued  *Topic was postponed. The date shown is not the original due date.    Advanced directives: (In Chart) A copy of your advanced directives are scanned into your chart should your provider ever need it.  Next Medicare Annual Wellness Visit scheduled for next year: Yes  Insert Preventive Care attachment Insert FALL PREVENTION attachment if needed

## 2023-02-09 ENCOUNTER — Encounter: Payer: Self-pay | Admitting: Gastroenterology

## 2023-02-09 DIAGNOSIS — Z23 Encounter for immunization: Secondary | ICD-10-CM | POA: Diagnosis not present

## 2023-02-11 ENCOUNTER — Inpatient Hospital Stay (HOSPITAL_BASED_OUTPATIENT_CLINIC_OR_DEPARTMENT_OTHER): Payer: Medicare Other | Admitting: Hematology and Oncology

## 2023-02-11 ENCOUNTER — Encounter: Payer: Self-pay | Admitting: Hematology and Oncology

## 2023-02-11 ENCOUNTER — Inpatient Hospital Stay: Payer: Medicare Other | Attending: Gynecologic Oncology

## 2023-02-11 VITALS — BP 123/69 | HR 72 | Temp 98.0°F | Resp 18 | Ht 66.0 in | Wt 144.6 lb

## 2023-02-11 DIAGNOSIS — C561 Malignant neoplasm of right ovary: Secondary | ICD-10-CM | POA: Insufficient documentation

## 2023-02-11 DIAGNOSIS — Z79899 Other long term (current) drug therapy: Secondary | ICD-10-CM | POA: Diagnosis not present

## 2023-02-11 DIAGNOSIS — R159 Full incontinence of feces: Secondary | ICD-10-CM | POA: Diagnosis not present

## 2023-02-11 LAB — CBC WITH DIFFERENTIAL/PLATELET
Abs Immature Granulocytes: 0.03 10*3/uL (ref 0.00–0.07)
Basophils Absolute: 0 10*3/uL (ref 0.0–0.1)
Basophils Relative: 1 %
Eosinophils Absolute: 0.2 10*3/uL (ref 0.0–0.5)
Eosinophils Relative: 3 %
HCT: 33.2 % — ABNORMAL LOW (ref 36.0–46.0)
Hemoglobin: 11.7 g/dL — ABNORMAL LOW (ref 12.0–15.0)
Immature Granulocytes: 1 %
Lymphocytes Relative: 15 %
Lymphs Abs: 0.9 10*3/uL (ref 0.7–4.0)
MCH: 34.3 pg — ABNORMAL HIGH (ref 26.0–34.0)
MCHC: 35.2 g/dL (ref 30.0–36.0)
MCV: 97.4 fL (ref 80.0–100.0)
Monocytes Absolute: 0.9 10*3/uL (ref 0.1–1.0)
Monocytes Relative: 16 %
Neutro Abs: 3.6 10*3/uL (ref 1.7–7.7)
Neutrophils Relative %: 64 %
Platelets: 199 10*3/uL (ref 150–400)
RBC: 3.41 MIL/uL — ABNORMAL LOW (ref 3.87–5.11)
RDW: 12.7 % (ref 11.5–15.5)
WBC: 5.5 10*3/uL (ref 4.0–10.5)
nRBC: 0 % (ref 0.0–0.2)

## 2023-02-11 LAB — COMPREHENSIVE METABOLIC PANEL
ALT: 10 U/L (ref 0–44)
AST: 16 U/L (ref 15–41)
Albumin: 4.1 g/dL (ref 3.5–5.0)
Alkaline Phosphatase: 54 U/L (ref 38–126)
Anion gap: 7 (ref 5–15)
BUN: 23 mg/dL (ref 8–23)
CO2: 29 mmol/L (ref 22–32)
Calcium: 9.4 mg/dL (ref 8.9–10.3)
Chloride: 102 mmol/L (ref 98–111)
Creatinine, Ser: 1.48 mg/dL — ABNORMAL HIGH (ref 0.44–1.00)
GFR, Estimated: 36 mL/min — ABNORMAL LOW (ref >60)
Glucose, Bld: 78 mg/dL (ref 70–99)
Potassium: 3.8 mmol/L (ref 3.5–5.1)
Sodium: 138 mmol/L (ref 135–145)
Total Bilirubin: 0.5 mg/dL (ref 0.3–1.2)
Total Protein: 6.7 g/dL (ref 6.5–8.1)

## 2023-02-11 NOTE — Progress Notes (Signed)
[facility] OFFICE PROGRESS NOTE  [name], DO as PCP - General (Family Medicine) [name], MD as Attending Physician (Obstetrics and Gynecology) [name], MD as Consulting Physician (Family Medicine) [name], OD (Optometry) [name], RN as [facility] Care Management Care, [facility]) [name], MD as Consulting Physician (Hematology and Oncology) [name], [name], MD as Consulting Physician (General Surgery) Noreene Larsson, RD as Dietitian (Dietician)  HISTORY OF PRESENTING ILLNESS: Discussed the use of AI scribe software for clinical note transcription with the patient, who gave verbal consent to proceed.  History of Present Illness   The patient, with a recent history ovarian cancer on Olaparib, is recovering well from recent abdominal surgery, reports a successful post-operative course, but is experiencing ongoing gastrointestinal symptoms. She is currently navigating dietary changes to manage these symptoms, with a focus on identifying foods that may exacerbate her condition. The patient's diet primarily consists of chicken, hamburger, and mashed potatoes, which are well-tolerated. However, she reports frequent diarrhea, which has been improving over the past three weeks. The patient believes that strengthening her muscles through Kegel exercises may help improve stool consistency by allowing for longer retention and water absorption.  Despite these challenges, the patient reports feeling generally well, with no significant weakness or weight loss. She has been adhering to her medication regimen, with no new medications added. Recent blood work indicates a slight increase in kidney function, potentially due to fluid loss from diarrhea. The patient is maintaining hydration with water and Gatorade.         Assessment and Plan    Ovarian cancer on olaparib she  had recent surgery, due for follow-up imaging. -Schedule CT scan on 03/14/2023. -Review results on 03/18/2023.    Postoperative Diet Recent surgery with ongoing dietary adjustments. No nausea, vomiting, or constipation. Diarrhea improving. No incontinence. Stool appears well-digested. -Continue current diet, focusing on well-cooked foods. -Appointment with dietician on 04/28/2023 for further dietary guidance.  Bowel Incontinence Recent surgery with improving bowel control. No current incontinence. -Continue Kegel exercises to improve muscle control.  Renal Function Elevated creatinine (1.48), possibly secondary to recent diarrhea. -Continue hydration. -Repeat labs on 03/14/2023.          Orders Placed This Encounter  Procedures   CT ABDOMEN PELVIS W CONTRAST    Standing Status:   Future    Standing Expiration Date:   02/11/2024    Order Specific Question:   If indicated for the ordered procedure, I authorize the administration of contrast media per Radiology protocol    Answer:   Yes    Order Specific Question:   Does the patient have a contrast media/X-ray dye allergy?    Answer:   No    Order Specific Question:   Preferred imaging location?    Answer:   [facility]    Order Specific Question:   If indicated for the ordered procedure, I authorize the administration of oral contrast media per Radiology protocol    Answer:   Yes    All questions were answered. The patient knows to call the clinic with any problems, questions or concerns. The total time spent in the appointment was 30 minutes encounter with patients including review of chart and various tests results, discussions about plan of care and coordination of care plan   [name], MD 02/11/2023 11:00 AM  REVIEW OF SYSTEMS:   Constitutional: Denies fevers, chills or abnormal  weight loss Eyes: Denies blurriness of vision Ears, nose, mouth, throat, and face: Denies mucositis or sore throat Respiratory:  Denies cough, dyspnea or wheezes Cardiovascular: Denies palpitation, chest discomfort or lower extremity swelling Skin: Denies abnormal skin rashes Lymphatics: Denies new lymphadenopathy or easy bruising Neurological:Denies numbness, tingling or new weaknesses Behavioral/Psych: Mood is stable, no new changes  All other systems were reviewed with the patient and are negative.  I have reviewed the past medical history, past surgical history, social history and family history with the patient and they are unchanged from previous note.  ALLERGIES:  is allergic to paclitaxel, emend [fosaprepitant dimeglumine], codeine, nsaids, penicillins, vibra-tab [doxycycline], iodine, and latex.  MEDICATIONS:  Current Outpatient Medications  Medication Sig Dispense Refill   acetaminophen (TYLENOL) 500 MG tablet Take 2 tablets (1,000 mg total) by mouth every 6 (six) hours as needed for mild pain. 30 tablet 0   amLODipine (NORVASC) 5 MG tablet Take 1 tablet (5 mg total) by mouth daily. 90 tablet 3   aspirin EC 81 MG tablet Take 81 mg by mouth at bedtime. Swallow whole.     atenolol (TENORMIN) 25 MG tablet Take 0.5 tablets (12.5 mg total) by mouth 2 (two) times daily. 90 tablet 3   Calcium Carb-Cholecalciferol (CALCIUM + VITAMIN D3 PO) Take 1 tablet by mouth 2 (two) times daily.     Ensure (ENSURE) Take 237 mLs by mouth 2 (two) times daily between meals.     EPINEPHrine 0.3 mg/0.3 mL IJ SOAJ injection Inject 0.3 mg into the muscle as needed for anaphylaxis. 2 each 2   levothyroxine (SYNTHROID) 100 MCG tablet Take 1 tablet (100 mcg total) by mouth in the morning. 90 tablet 3   Multiple Vitamin (MULTIVITAMIN) tablet Take 1 tablet by mouth in the morning.     olaparib (LYNPARZA) 100 MG tablet Take 1 tablet (100 mg total) by mouth 2 (two) times daily. Swallow whole. May take with food to decrease nausea and vomiting. 60 tablet 11   ondansetron (ZOFRAN) 4 MG tablet Take 1 tablet (4 mg total) by mouth every 6 (six)  hours as needed for nausea. 20 tablet 0   simvastatin (ZOCOR) 20 MG tablet Take 1 tablet (20 mg total) by mouth daily. 90 tablet 3   No current facility-administered medications for this visit.    SUMMARY OF ONCOLOGIC HISTORY: Oncology History Overview Note  High grade papillary serous 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   Right ovarian epithelial cancer (HCC)  03/16/2022 Imaging   1. Large, mixed solid and cystic mass in the low pelvis, difficult to clearly delineate from adjacent ascites although at least 10.2 x 10.0 cm. This is highly concerning for primary ovarian malignancy and most likely arises from the right ovary, although may involve both ovaries. This may be further evaluated by contrast enhanced MRI of the pelvis if desired. 2. Large volume ascites throughout the abdomen and pelvis, presumed malignant. Stranding and nodularity of the omentum and peritoneum, particularly in the ventral abdomen and left upper quadrant, highly suspicious for peritoneal metastatic disease. 3. Cirrhotic morphology of the liver, which may contribute to ascites. 4. Severely atrophic left kidney, consistent with remote prior infectious, obstructive, or ischemic insult. 5. Coronary artery disease.   Aortic Atherosclerosis (ICD10-I70.0).   03/17/2022 Imaging   1. There is a large solid and cystic mass within the right adnexa which measures up to 9.5 cm in maximum dimension. Findings are concerning for a primary ovarian neoplasm. 2. Large volume of ascites  within the abdomen and pelvis. Given the presence of peritoneal nodules on the CT from the prior day findings are concerning for malignant ascites.   03/19/2022 Procedure   CT-guided biopsy of omental mass.    03/19/2022 Procedure   Successful ultrasound-guided paracentesis yielding 4.2 liters of peritoneal fluid.   03/19/2022 Procedure   Successful placement of a left internal jugular approach power injectable Port-A-Cath. The  catheter is ready for immediate use.   03/30/2022 Initial Diagnosis   Ovarian cancer (HCC)   03/30/2022 Pathology Results   A. RIGHT OVARY AND FALLOPIAN TUBE, SALPINGO OOPHORECTOMY: Poorly differentiated carcinoma immunohistochemically most consistent with a high grade serous carcinoma Tumor measures 10.7 x 10.6 x 4.6 cm Tumor involves ovarian and mesosalpinx serosal surfaces and right fallopian tube pT2a pN n/a  Note: The tumor consists of sheets and cords of high-grade nuclei with areas of necrosis and adjacent prominent stroma suggesting papillary cores the overall morphology is slightly unusual and not classic; therefore, a battery of immunohistochemical stains were performed to confirm the diagnosis and exclude a high-grade germ cell tumor.  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 Additionally there is nonspecific focal PLAP positivity; CD117 positivity and D2-40 positivity of uncertain clinical significance.  The tumor is negative for AFP, hCG, CD30, CD31, CK20, glypican-3, and OCT 3/4.  Dr. Luisa Hart has peer reviewed the case and agrees with diagnosis. Dr. Venetia Night has also initiated the workup of the case and agrees with the interpretation.  B. OMENTUM, RESECTION: Benign omentum Negative for tumor  C. RIGHT PELVIC SIDEWALL, BIOPSY: Benign mesothelium, fibrovascular stroma and adipose Negative for tumor  D. ANTERIOR CUL DE SAC, BIOPSY: Benign mesothelium Negative for tumor  E. LEFT PELVIC SIDEWALL, BIOPSY: Benign mesothelium and adipose Negative for tumor   ONCOLOGY TABLE:  OVARY or FALLOPIAN TUBE or PRIMARY PERITONEUM: Resection  Procedure: Right salpingo-oophorectomy Specimen Integrity: Grossly focally disrupted Tumor Site: Right ovary Tumor Size: 10.7 x 10.6 x 4.6 cm Histologic Type: Poorly differentiated carcinoma most consistent with a high-grade serous  carcinoma Histologic Grade: High-grade Ovarian Surface Involvement: Surface involvement Fallopian Tube Surface Involvement: Mesosalpinx serosal surface involvement and right fallopian tube involvement Implants (required for advanced stage serous/seromucinous borderline tumors only): Not identified Other Tissue/ Organ Involvement: N/A Largest Extrapelvic Peritoneal Focus: N/A Peritoneal/Ascitic Fluid Involvement: N/A Chemotherapy Response Score (CRS): N/A, no known presurgical therapy Regional Lymph Nodes: N/A, no lymph nodes submitted Distant Metastasis:      Distant Site(s) Involved: N/A Pathologic Stage Classification (pTNM, AJCC 8th Edition): pT2a, pN N/A Ancillary Studies: Can be performed on physician request Representative Tumor Block: A8 Comment(s): [None]    03/30/2022 Surgery   Preop Diagnosis: Pelvic mass, malignant ascites   Postoperative Diagnosis: at least stage IIB ovarian cancer, suspected high grade serous by frozen section   Surgery: Diagnostic laparoscopy, right salpingo-oophorectomy, peritoneal biopsies, infra-colic omentectomy    Surgeon:  Carin Hock MD    Operative findings: On exam under anesthesia, somewhat mobile mass appreciated in the cul-de-sac to the right.  On agnostic laparoscopy, normal upper abdominal findings other than moderate volume ascites.  Normal-appearing peritoneum, omentum, small bowel.  Right adnexa enlarged with a mass measuring approximately 8-10 cm.  No carcinomatosis appreciated.  Given this, decision made to convert to an open procedure for tumor debulking.  Approximately 5 L of yellow-tinged ascites was removed upon intra-abdominal entry.  Normal upper abdominal survey including smooth diaphragm and  liver, stomach, and omentum.  The small bowel was run from the cecum to the ligament of Treitz with no abnormalities or carcinomatosis noted.  Right ovary replaced by an approximately 8 cm smooth mass, dilated and enlarged fallopian tube.  The  mass itself was adherent to the right pelvic sidewall, sigmoid colon, and densely adherent to the anterior cul-de-sac/bladder peritoneum.  Frozen section consistent with invasive carcinoma, likely high-grade.  Left tube and ovary were surgically absent.  Sigmoid colon with adhesions to the left pelvic sidewall, left aspect of the bladder and the left vaginal cuff.  Somewhat redundant sigmoid which looped over on itself secondary to these adhesions.  Numerous diverticula noted of the sigmoid colon as well as the transverse colon.  The end of surgery, an R0 resection was accomplished.     04/13/2022 Cancer Staging   Staging form: Ovary, Fallopian Tube, and Primary Peritoneal Carcinoma, AJCC 8th Edition - Pathologic stage from 04/13/2022: Stage II (pT2, pN0, cM0) - Signed by [name], MD on 04/13/2022 Stage prefix: Initial diagnosis   04/26/2022 - 08/10/2022 Chemotherapy   Patient is on Treatment Plan : OVARIAN Carboplatin (AUC 6) + Paclitaxel (175) q21d X 6 Cycles     05/17/2022 Tumor Marker   Patient's tumor was tested for the following markers: CA-125. Results of the tumor marker test revealed 12.7.   06/06/2022 Genetic Testing   Germline: Negative [facility] Multi-Cancer +RNA Panel.  Report date is 06/06/2022.   The Multi-Cancer + RNA Panel offered by [facility] includes sequencing and/or deletion/duplication analysis of the following 70 genes:  AIP*, ALK, APC*, ATM*, AXIN2*, BAP1*, BARD1*, BLM*, BMPR1A*, BRCA1*, BRCA2*, BRIP1*, CDC73*, CDH1*, CDK4, CDKN1B*, CDKN2A, CHEK2*, CTNNA1*, DICER1*, EPCAM (del/dup only), EGFR, FH*, FLCN*, GREM1 (promoter dup only), HOXB13, KIT, LZTR1, MAX*, MBD4, MEN1*, MET, MITF, MLH1*, MSH2*, MSH3*, MSH6*, MUTYH*, NF1*, NF2*, NTHL1*, PALB2*, PDGFRA, PMS2*, POLD1*, POLE*, POT1*, PRKAR1A*, PTCH1*, PTEN*, RAD51C*, RAD51D*, RB1*, RET, SDHA* (sequencing only), SDHAF2*, SDHB*, SDHC*, SDHD*, SMAD4*, SMARCA4*, SMARCB1*, SMARCE1*, STK11*, SUFU*, TMEM127*, TP53*, TSC1*, TSC2*, VHL*. RNA  analysis is performed for * genes.  Somatic: Positive genomic instability score (GIS) of 62 through [facility].  No pathogenic variants detected in BRCA1/2.  Report date is June 10, 2022.   [employer] CDx includes sequencing and large rearrangement analysis of BRCA1/2 and genomic instability status through loss of heterozygosity (LOH), telomeric allelic imbalance (TAI) and large-scale state transitions (LST).     06/08/2022 Tumor Marker   Patient's tumor was tested for the following markers: Ca-125. Results of the tumor marker test revealed 16.5.   06/13/2022 - 06/17/2022 Hospital Admission   The patient was admitted to the hospital and found to have diarrhea, diverticulitis and pancytopenia   06/13/2022 Imaging   Colonic diverticulosis. Inflammatory stranding around the mid sigmoid colon compatible with active diverticulitis.   Interval resection of the previously seen lower pelvic solid and cystic mass and resolution of ascites.   Aortic atherosclerosis.   Severely atrophic left kidney, stable since prior study.   06/30/2022 Tumor Marker   Patient's tumor was tested for the following markers: CA-125. Results of the tumor marker test revealed 15.   07/21/2022 Tumor Marker   Patient's tumor was tested for the following markers: CA-125. Results of the tumor marker test revealed 11.4.   08/12/2022 Tumor Marker   Patient's tumor was tested for the following markers: CA-125. Results of the tumor marker test revealed 9.7.   08/14/2022 Imaging   1. Wall thickening of the sigmoid colon with adjacent inflammatory  change, overall improved when compared with prior exam. New focal collection of gas involving the wall of the sigmoid colon which extends inferiorly to the area of the left vaginal cuff with contrast material seen within the vaginal cuff. 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 2. Wall thickening of the left lateral wall of the urinary bladder with air seen within  the urinary bladder, findings raise concern for additional area of fistulization to the bladder. CT cystogram could be performed for better evaluation. 3. New pelviectasis and mild dilation of the left ureter, likely secondary to inflammatory change at the area of the left ureterovesicular junction. Chronic atrophy of the left kidney. 4. Increased wall thickening of a large colonic diverticulum of the proximal transverse colon, likely due to diverticulitis. 5. Aortic Atherosclerosis (ICD10-I70.0).     08/23/2022 Imaging   1. Increased large volume gas within the bladder. This raises concern for colovesical fistula. Consider CT cystogram for further evaluation. 2. Similar to slightly decreased inflammatory changes surrounding the sigmoid colon. The previous collection of gas involving the wall of the sigmoid colon has decreased. There is a small amount of residual gas in the area of the left vaginal cuff. There is again a soft tissue tract extending from the vaginal cuff to the sigmoid colon, though no gas is seen within this tract. 3. Decreased size and wall thickening about the large diverticulum in the proximal transverse colon. 4. Slight decrease in pelviectasis and dilation of the left ureter.   Aortic Atherosclerosis (ICD10-I70.0).     08/27/2022 Imaging   1. Continued sigmoid wall thickening and inflammatory reaction with diverticulosis with increased inflammation and layering reactive pelvic fluid since April 15. Further evaluation to exclude underlying colonic lesion is recommended when clinically feasible. 2. There is now a demonstrable fistulous tract from the sigmoid colon into the left dorsal bladder wall, with air and fluid in the bladder and patchy enteric contrast now settling in the dependent bladder. 3. The known left-sided colovaginal fistula is not well seen today but there is contrast in the vagina consistent with continued tract patency. 4. There is now moderate bilateral  hydroureteronephrosis into the pelvis where the ureters are difficult to follow due to the pre-existing inflammatory process, with ureteral tethering by inflammatory process most likely causing the obstructive uropathy. No stone disease is seen. 5. Severe chronic atrophy of the left kidney. 6. Aortic and coronary artery atherosclerosis. 7. Anemia. 8. Prominent common bile duct at 12 mm, unchanged. 9. Constipation. 10. Osteopenia, scoliosis, and advanced degenerative change of the lumbar spine with acquired spinal stenosis L4-5.   09/01/2022 Surgery   Preoperative diagnosis: colovesical fistula   Postoperative diagnosis: same    Procedure: laparoscopic converted to open sigmoid resection with anastomosis, diverting loop ileostomy, take down of colovesical fistula   Surgeon: Feliciana Rossetti, M.D.   Asst: Carl Best, [facility]   Anesthesia: GETA   Indications for procedure: JAVON HUPFER is a 77 y.o. year old female with symptoms of recurrent UTIs and abdominal pain.   Description of procedure: The patient was brought into the operative suite. Anesthesia was administered with General endotracheal anesthesia. WHO checklist was applied. The patient was then placed in lithotomy position. The area was prepped and draped in the usual sterile fashion.   Dr. Laverle Patter performed a cysto and plans ureteral stents prior to surgery.   Next, a left subcostal incision was made. A 5mm trocar was used to gain access to the peritoneal cavity by optical entry technique. Pneumoperitoneum  was applied with a high flow and low pressure. The laparoscope was reinserted to confirm position.   3 additional trocars were placed 1 5 mm trocar in the right upper abdomen, 1 5 mm trocar in the infraumbilical space, and 1 12 mm trocar in the right lower quadrant.   There were no adhesions to the abdominal wall. The sigmoid colon was thickened and chronically inflamed. There was one loop of small intestine adhesed to the  sigmoid colon, this was sharply dissected free. The White line of Toldt was incised. Blunt dissection was used to free up the colon from the bladder and abdominal wall. This was very difficult. During this dissection Dr. Melynda Keller came in to look at the abdomen and determined no evidence of cancer was present. After about 30 minutes of laparoscopic dissection, I did not think I could safely complete dissection laparoscopically and so a lower midline incision was made.   Next, palpation identified the ureters which were well away from the dissection. Blunt dissection was able to free the colon completely from the bladder and side wall. Next, a blue load contour stapler was used to divide the distal sigmoid colon. The mesentery was taken with ligasure device. The left colon was fully mobilized a portion of colon proximal to the inflammatory changes was purse stringed and divided distally. A 29 mm anvil was placed into the colon and purse string tied down.    Next, Dr. Laverle Patter came in to repair the bladder. See his dictation for more information.   The rectum was mobilized more by freeing it from the side walls. At this point I went below to palpate the rectum, there was some difficulty to get the dilator to reach the staple line. There was an attempt to pass the stapler but this led to a tear in the distal rectum. Dr. Michaell Cowing came in at this time to assist and a further margin of sigmoid/rectum was taken with the contour stapler. This allowed the stapler to come to the end and anastomosis was performed. Leak test showed a small leak distal to the staple line that was repaired with interrupted 3-0 vicryl. Due to this leak and thin tissue at the area, decision was made to divert with loop ileostomy.   The 12 mm trocar site was enlarged and a portion of distal ileum brought through the hole. A 19 fr blake drain was brought through RUQ incision and tip placed into the pelvis. The abdomen was irrigated. Hemostasis was  intact. The peritoneum was closed with 2-0 vicryl. The fascia was closed with 0 PDS. The ileostomy was matured with 3-0 and 2-3 cm of Brooking. The skin was closed with staples. Ileostomy bag was placed. All counts were correct.   Findings: colovesical fistula, dense inflammation of the sigmoid colon   09/01/2022 Surgery   Preoperative diagnosis: 1.  Colovesical fistula 2.  Atrophic left kidney 3.  Right hydronephrosis   Postoperative diagnosis: 1.  Colovesical fistula 2.  Atrophic left kidney 3.  Right hydronephrosis   Procedures: 1.  Cystoscopy 2.  Bilateral retrograde pyelography with interpretation 3.  Bilateral ureteral stent placement (right -6 x 24, left -6 x 26) 4.  Repair of bladder fistula   10/01/2022 Tumor Marker   Patient's tumor was tested for the following markers: CA-125. Results of the tumor marker test revealed 14.   10/11/2022 -  Chemotherapy   She started taking olaparib   10/25/2022 Tumor Marker   Patient's tumor was tested for the following markers:  CA-125. Results of the tumor marker test revealed 10.6.   11/15/2022 Procedure   Successful removal of implanted Port-A-Cath.      PHYSICAL EXAMINATION: ECOG PERFORMANCE STATUS: 1 - Symptomatic but completely ambulatory  Vitals:   02/11/23 0941  BP: 123/69  Pulse: 72  Resp: 18  Temp: 98 F (36.7 C)  SpO2: 100%   Filed Weights   02/11/23 0941  Weight: 144 lb 9.6 oz (65.6 kg)    GENERAL:alert, no distress and comfortable   LABORATORY DATA:  I have reviewed the data as listed    Component Value Date/Time   NA 138 02/11/2023 0925   NA 135 11/25/2022 0935   K 3.8 02/11/2023 0925   CL 102 02/11/2023 0925   CO2 29 02/11/2023 0925   GLUCOSE 78 02/11/2023 0925   BUN 23 02/11/2023 0925   BUN 21 11/25/2022 0935   CREATININE 1.48 (H) 02/11/2023 0925   CREATININE 0.93 08/10/2022 0857   CREATININE 0.99 02/05/2013 0757   CALCIUM 9.4 02/11/2023 0925   PROT 6.7 02/11/2023 0925   PROT 6.5 11/25/2022  0935   ALBUMIN 4.1 02/11/2023 0925   ALBUMIN 4.2 11/25/2022 0935   AST 16 02/11/2023 0925   AST 12 (L) 08/10/2022 0857   ALT 10 02/11/2023 0925   ALT 11 08/10/2022 0857   ALKPHOS 54 02/11/2023 0925   BILITOT 0.5 02/11/2023 0925   BILITOT 0.5 11/25/2022 0935   BILITOT 0.3 08/10/2022 0857   GFRNONAA 36 (L) 02/11/2023 0925   GFRNONAA >60 08/10/2022 0857   GFRNONAA 59 (L) 02/05/2013 0757   GFRAA 61 05/20/2020 0850   GFRAA 68 02/05/2013 0757    No results found for: "SPEP", "UPEP"  Lab Results  Component Value Date   WBC 5.5 02/11/2023   NEUTROABS 3.6 02/11/2023   HGB 11.7 (L) 02/11/2023   HCT 33.2 (L) 02/11/2023   MCV 97.4 02/11/2023   PLT 199 02/11/2023      Chemistry      Component Value Date/Time   NA 138 02/11/2023 0925   NA 135 11/25/2022 0935   K 3.8 02/11/2023 0925   CL 102 02/11/2023 0925   CO2 29 02/11/2023 0925   BUN 23 02/11/2023 0925   BUN 21 11/25/2022 0935   CREATININE 1.48 (H) 02/11/2023 0925   CREATININE 0.93 08/10/2022 0857   CREATININE 0.99 02/05/2013 0757      Component Value Date/Time   CALCIUM 9.4 02/11/2023 0925   ALKPHOS 54 02/11/2023 0925   AST 16 02/11/2023 0925   AST 12 (L) 08/10/2022 0857   ALT 10 02/11/2023 0925   ALT 11 08/10/2022 0857   BILITOT 0.5 02/11/2023 0925   BILITOT 0.5 11/25/2022 0935   BILITOT 0.3 08/10/2022 0857       RADIOGRAPHIC STUDIES: I have personally reviewed the radiological images as listed and agreed with the findings in the report. No results found.

## 2023-02-12 LAB — CA 125: Cancer Antigen (CA) 125: 9.5 U/mL (ref 0.0–38.1)

## 2023-02-14 ENCOUNTER — Telehealth: Payer: Self-pay

## 2023-02-14 NOTE — Telephone Encounter (Signed)
Continued message. Returned her call and reviewed upcoming appts. She verbalized understanding.

## 2023-02-14 NOTE — Telephone Encounter (Signed)
Returned her call.

## 2023-02-15 ENCOUNTER — Encounter: Payer: Self-pay | Admitting: Gastroenterology

## 2023-02-15 ENCOUNTER — Ambulatory Visit (INDEPENDENT_AMBULATORY_CARE_PROVIDER_SITE_OTHER): Payer: Medicare Other | Admitting: Gastroenterology

## 2023-02-15 VITALS — BP 108/64 | HR 71 | Ht 66.0 in | Wt 143.7 lb

## 2023-02-15 DIAGNOSIS — R194 Change in bowel habit: Secondary | ICD-10-CM

## 2023-02-15 DIAGNOSIS — Z9049 Acquired absence of other specified parts of digestive tract: Secondary | ICD-10-CM | POA: Diagnosis not present

## 2023-02-15 DIAGNOSIS — Z8719 Personal history of other diseases of the digestive system: Secondary | ICD-10-CM

## 2023-02-15 DIAGNOSIS — C561 Malignant neoplasm of right ovary: Secondary | ICD-10-CM

## 2023-02-15 NOTE — Patient Instructions (Signed)
Gradually resume original diet. Introduce whole wheat products around 2 weeks and raw fruits and vegetables around 4 weeks.   Keep appointment with Nutrionist in December unless your diet is going well and you may cancel appointment.   Call  if you have any recurrent symptoms of diverticulitis.   The [facility] providers would like to encourage you to use [facility] to communicate with providers for non-urgent requests or questions.  Due to long hold times on the telephone, sending your provider a message by [facility] may be a faster and more efficient way to get a response.  Please allow 48 business hours for a response.  Please remember that this is for non-urgent requests.   Thank you for choosing me and [facility].  Venita Lick. Pleas Koch., MD., Clementeen Graham

## 2023-02-15 NOTE — Progress Notes (Signed)
    Assessment     Change in bowel habits, intermittent diarrhea likely related to surgeries and diet Diverticulitis S/P sigmoid colectomy with loop ileostomy, April 2024 S/P ileostomy take down, Sept 2024 Stage IIB ovarian cancer S/P R salpingo-oophorectomy peritoneal biopsies, infracolic omentectomy, Nov 2023. Currently on olaparib  Personal history of adenomatous colon polyps - no longer in surveillance   Recommendations    Follow all general surgery recommendations Follow-up with her oncologist, Dr. Bertis Ruddy, as planned Gradually resume prior diet however wait 2 weeks for high fiber foods and wait 4 weeks for raw fruits, raw vegetables We discussed symptoms of diverticulitis and if they recur she is advised to contact us or her PCP CT AP scheduled on 11/4 by her oncologist GI follow up prn   HPI    This is a 77 year old female here today with several concerns including specific dietary recommendations following her surgeries and management of potential recurrent diverticulitis.  She is unaccompanied. She has noted a change in bowel habits and is currently on a restricted diet as recommended by her surgeon.  She notes alternating diarrhea with occasional constipation and occasional ball-like stools.  She has had some fecal leakage since her ileostomy take down however this has improved significantly. Her appetite is good. Denies abdominal pain, change in stool caliber, melena, hematochezia, nausea, vomiting, dysphagia, reflux symptoms, chest pain.   Colonoscopy Aug 2022 -1 mm colon polyp-tubular adenoma -Severe left colon diverticulosis -Internal hemorrhoids   Labs / Imaging       Latest Ref Rng & Units 02/11/2023    9:25 AM 11/29/2022    7:49 AM 11/25/2022    9:35 AM  Hepatic Function  Total Protein 6.5 - 8.1 g/dL 6.7  6.6  6.5   Albumin 3.5 - 5.0 g/dL 4.1  4.1  4.2   AST 15 - 41 U/L 16  16  16    ALT 0 - 44 U/L 10  12  11    Alk Phosphatase 38 - 126 U/L 54  51  65   Total  Bilirubin 0.3 - 1.2 mg/dL 0.5  0.5  0.5        Latest Ref Rng & Units 02/11/2023    9:25 AM 01/14/2023    4:36 AM 01/13/2023    4:37 AM  CBC  WBC 4.0 - 10.5 K/uL 5.5  5.5  7.5   Hemoglobin 12.0 - 15.0 g/dL 16.1  09.6  04.5   Hematocrit 36.0 - 46.0 % 33.2  30.0  32.6   Platelets 150 - 400 K/uL 199  173  180    Current Medications, Allergies, Past Medical History, Past Surgical History, Family History and Social History were reviewed in [employer] record.   Physical Exam: General: Well developed, well nourished, no acute distress Head: Normocephalic and atraumatic Eyes: Sclerae anicteric, EOMI Ears: Normal auditory acuity Mouth: No deformities or lesions noted Lungs: Clear throughout to auscultation Heart: Regular rate and rhythm; No murmurs, rubs or bruits Abdomen: Soft, non tender and non distended. Well healed surgical incisions. No masses, hepatosplenomegaly or hernias noted. Normal Bowel sounds Rectal: Not done Musculoskeletal: Symmetrical with no gross deformities  Pulses:  Normal pulses noted Extremities: No edema or deformities noted Neurological: Alert oriented x 4, grossly nonfocal Psychological:  Alert and cooperative. Anxiouis   April Weber T. Russella Dar, MD 02/15/2023, 9:59 AM

## 2023-02-16 ENCOUNTER — Other Ambulatory Visit: Payer: Self-pay | Admitting: Family Medicine

## 2023-02-16 DIAGNOSIS — Z139 Encounter for screening, unspecified: Secondary | ICD-10-CM

## 2023-02-16 DIAGNOSIS — Z23 Encounter for immunization: Secondary | ICD-10-CM | POA: Diagnosis not present

## 2023-02-21 ENCOUNTER — Ambulatory Visit
Admission: RE | Admit: 2023-02-21 | Discharge: 2023-02-21 | Disposition: A | Discharge status: Home / Self Care | Payer: Medicare Other | Source: Ambulatory Visit | Attending: Family Medicine

## 2023-02-21 ENCOUNTER — Telehealth: Payer: Self-pay | Admitting: Family Medicine

## 2023-02-21 DIAGNOSIS — Z853 Personal history of malignant neoplasm of breast: Secondary | ICD-10-CM

## 2023-02-21 DIAGNOSIS — Z139 Encounter for screening, unspecified: Secondary | ICD-10-CM

## 2023-02-22 NOTE — Telephone Encounter (Signed)
Needs a referral for what?  Is she asking for a diagnostic mammogram?  What MRI?

## 2023-02-22 NOTE — Telephone Encounter (Signed)
Again, I have no idea what MRI she is talking about.  She had MRI guided biopsy of breast last year.  This was reflexively ordered after diagnostic mammogram.  Please clarify what she is asking about, as the last time I saw her was 3 months ago.  Typically, the breast center cosigns appropriate orders to be pertaining to any additional imaging.  I've ordered her mammo/ ultrasound of the left breast

## 2023-03-07 ENCOUNTER — Telehealth: Payer: Self-pay | Admitting: Genetic Counselor

## 2023-03-07 NOTE — Telephone Encounter (Signed)
Patient received denial notice from Medicare regarding [facility] genetic testing (HRD).  Called [facility]--- per Billing rep, patient should not have an OOP cost for testing despite denial notice from Medicare.  Called patient to inform her of no expected OOP per [facility].

## 2023-03-11 ENCOUNTER — Telehealth: Payer: Self-pay | Admitting: Pharmacy Technician

## 2023-03-11 NOTE — Telephone Encounter (Signed)
Oral Oncology Patient Advocate Encounter   Received notification that patient is due for re-enrollment for assistance for Lynparza through [employer].   Re-enrollment process has been initiated and will be submitted upon completion of necessary documents.  Patient agreed to sign documents 03/14/23 at the office.  [employer] phone number [phone].   I will continue to follow until final determination.  Jinger Neighbors, CPhT-Adv Oncology Pharmacy Patient Advocate [facility] Direct Number: [phone]  Fax: [phone]

## 2023-03-14 ENCOUNTER — Inpatient Hospital Stay: Payer: Medicare Other | Attending: Gynecologic Oncology

## 2023-03-14 ENCOUNTER — Other Ambulatory Visit: Payer: Self-pay

## 2023-03-14 ENCOUNTER — Ambulatory Visit (HOSPITAL_COMMUNITY)
Admission: RE | Admit: 2023-03-14 | Discharge: 2023-03-14 | Disposition: A | Discharge status: Home / Self Care | Payer: Medicare Other | Source: Ambulatory Visit | Attending: Hematology and Oncology | Admitting: Hematology and Oncology

## 2023-03-14 DIAGNOSIS — C561 Malignant neoplasm of right ovary: Secondary | ICD-10-CM | POA: Diagnosis not present

## 2023-03-14 DIAGNOSIS — D6481 Anemia due to antineoplastic chemotherapy: Secondary | ICD-10-CM | POA: Insufficient documentation

## 2023-03-14 DIAGNOSIS — N281 Cyst of kidney, acquired: Secondary | ICD-10-CM | POA: Diagnosis not present

## 2023-03-14 DIAGNOSIS — Z79899 Other long term (current) drug therapy: Secondary | ICD-10-CM | POA: Insufficient documentation

## 2023-03-14 DIAGNOSIS — N1831 Chronic kidney disease, stage 3a: Secondary | ICD-10-CM | POA: Insufficient documentation

## 2023-03-14 LAB — CBC WITH DIFFERENTIAL/PLATELET
Abs Immature Granulocytes: 0.02 10*3/uL (ref 0.00–0.07)
Basophils Absolute: 0.1 10*3/uL (ref 0.0–0.1)
Basophils Relative: 1 %
Eosinophils Absolute: 0.1 10*3/uL (ref 0.0–0.5)
Eosinophils Relative: 2 %
HCT: 33.2 % — ABNORMAL LOW (ref 36.0–46.0)
Hemoglobin: 11.6 g/dL — ABNORMAL LOW (ref 12.0–15.0)
Immature Granulocytes: 0 %
Lymphocytes Relative: 13 %
Lymphs Abs: 0.8 10*3/uL (ref 0.7–4.0)
MCH: 33.5 pg (ref 26.0–34.0)
MCHC: 34.9 g/dL (ref 30.0–36.0)
MCV: 96 fL (ref 80.0–100.0)
Monocytes Absolute: 0.5 10*3/uL (ref 0.1–1.0)
Monocytes Relative: 7 %
Neutro Abs: 5.1 10*3/uL (ref 1.7–7.7)
Neutrophils Relative %: 77 %
Platelets: 201 10*3/uL (ref 150–400)
RBC: 3.46 MIL/uL — ABNORMAL LOW (ref 3.87–5.11)
RDW: 13.6 % (ref 11.5–15.5)
WBC: 6.6 10*3/uL (ref 4.0–10.5)
nRBC: 0 % (ref 0.0–0.2)

## 2023-03-14 LAB — COMPREHENSIVE METABOLIC PANEL
ALT: 12 U/L (ref 0–44)
AST: 16 U/L (ref 15–41)
Albumin: 4.1 g/dL (ref 3.5–5.0)
Alkaline Phosphatase: 49 U/L (ref 38–126)
Anion gap: 6 (ref 5–15)
BUN: 23 mg/dL (ref 8–23)
CO2: 26 mmol/L (ref 22–32)
Calcium: 9.5 mg/dL (ref 8.9–10.3)
Chloride: 104 mmol/L (ref 98–111)
Creatinine, Ser: 1.44 mg/dL — ABNORMAL HIGH (ref 0.44–1.00)
GFR, Estimated: 37 mL/min — ABNORMAL LOW (ref >60)
Glucose, Bld: 89 mg/dL (ref 70–99)
Potassium: 3.4 mmol/L — ABNORMAL LOW (ref 3.5–5.1)
Sodium: 136 mmol/L (ref 135–145)
Total Bilirubin: 0.5 mg/dL (ref <1.2)
Total Protein: 6.6 g/dL (ref 6.5–8.1)

## 2023-03-14 NOTE — Telephone Encounter (Signed)
Oral Oncology Patient Advocate Encounter  Patient signed required documents in lobby. Application is now pending MD signatures.  Jinger Neighbors, CPhT-Adv Oncology Pharmacy Patient Advocate [facility] Direct Number: [phone]  Fax: [phone]

## 2023-03-15 ENCOUNTER — Ambulatory Visit
Admission: RE | Admit: 2023-03-15 | Discharge: 2023-03-15 | Disposition: A | Discharge status: Home / Self Care | Payer: Medicare Other | Source: Ambulatory Visit | Attending: Family Medicine

## 2023-03-15 DIAGNOSIS — Z853 Personal history of malignant neoplasm of breast: Secondary | ICD-10-CM | POA: Diagnosis not present

## 2023-03-15 DIAGNOSIS — R928 Other abnormal and inconclusive findings on diagnostic imaging of breast: Secondary | ICD-10-CM | POA: Diagnosis not present

## 2023-03-15 DIAGNOSIS — Z9011 Acquired absence of right breast and nipple: Secondary | ICD-10-CM | POA: Diagnosis not present

## 2023-03-15 LAB — CA 125: Cancer Antigen (CA) 125: 7.8 U/mL (ref 0.0–38.1)

## 2023-03-17 ENCOUNTER — Ambulatory Visit: Payer: Medicare Other

## 2023-03-17 NOTE — Telephone Encounter (Signed)
Oral Oncology Patient Advocate Encounter   Submitted application for assistance for Lynparza to [employer].   Application submitted via e-fax to [phone]   [employer] phone number [phone].   I will continue to check the status until final determination.   Lady Deutscher, CPhT-Adv Oncology Pharmacy [facility] [place] Direct Number: [phone]  Fax: [phone]

## 2023-03-18 ENCOUNTER — Encounter: Payer: Self-pay | Admitting: Hematology and Oncology

## 2023-03-18 ENCOUNTER — Inpatient Hospital Stay (HOSPITAL_BASED_OUTPATIENT_CLINIC_OR_DEPARTMENT_OTHER): Payer: Medicare Other | Admitting: Hematology and Oncology

## 2023-03-18 VITALS — BP 150/77 | HR 60 | Temp 98.3°F | Resp 18 | Ht 66.0 in | Wt 146.8 lb

## 2023-03-18 DIAGNOSIS — D6481 Anemia due to antineoplastic chemotherapy: Secondary | ICD-10-CM

## 2023-03-18 DIAGNOSIS — N1831 Chronic kidney disease, stage 3a: Secondary | ICD-10-CM | POA: Diagnosis not present

## 2023-03-18 DIAGNOSIS — C561 Malignant neoplasm of right ovary: Secondary | ICD-10-CM | POA: Diagnosis not present

## 2023-03-18 DIAGNOSIS — T451X5A Adverse effect of antineoplastic and immunosuppressive drugs, initial encounter: Secondary | ICD-10-CM | POA: Diagnosis not present

## 2023-03-18 DIAGNOSIS — Z79899 Other long term (current) drug therapy: Secondary | ICD-10-CM | POA: Diagnosis not present

## 2023-03-18 NOTE — Assessment & Plan Note (Signed)
She is prone to get dehydrated I recommend frequent oral fluid intake Overall, her renal function is stable

## 2023-03-18 NOTE — Progress Notes (Signed)
[facility] OFFICE PROGRESS NOTE  [name], DO as PCP - General (Family Medicine) [name], MD as Attending Physician (Obstetrics and Gynecology) [name], MD as Consulting Physician (Family Medicine) [name], OD (Optometry) [name], RN as [facility] Care Management Care, [facility]) [name], MD as Consulting Physician (Hematology and Oncology) [name], [name], MD as Consulting Physician (General Surgery) Noreene Larsson, RD as Dietitian (Dietician)  ASSESSMENT & PLAN:  Right ovarian epithelial cancer [facility]) I have reviewed multiple imaging studies with the patient The area of skin nodule that was reported is not palpable today on exam but will be monitored closely We discussed tumor marker monitoring and future follow-up She will get another appointment again in 2 months with port flushes and she will continue olaparib She will see GYN surgeon in 3 months I plan to repeat imaging study again in 4 to 6 months  Anemia due to antineoplastic chemotherapy This is likely anemia of chronic disease. The patient denies recent history of bleeding such as epistaxis, hematuria or hematochezia. She is asymptomatic from the anemia. We will observe for now.   Overall, her hemoglobin is improving  Stage 3a chronic kidney disease (HCC) She is prone to get dehydrated I recommend frequent oral fluid intake Overall, her renal function is stable  Orders Placed This Encounter  Procedures   CA 125    Standing Status:   Standing    Number of Occurrences:   11    Standing Expiration Date:   03/17/2024    All questions were answered. The patient knows to call the clinic with any problems, questions or concerns. The total time spent in the appointment was 30 minutes encounter with patients including review of chart and various tests results, discussions about plan of care  and coordination of care plan   [name], MD 03/18/2023 12:43 PM  INTERVAL HISTORY: Please see below for problem oriented charting. she returns for surveillance follow-up and review of test results She tolerated recent treatment well We discussed limitation of tumor marker monitoring We discussed timing of next imaging We discussed new nodule seen on CT imaging which by exam is not palpable  REVIEW OF SYSTEMS:   Constitutional: Denies fevers, chills or abnormal weight loss Eyes: Denies blurriness of vision Ears, nose, mouth, throat, and face: Denies mucositis or sore throat Respiratory: Denies cough, dyspnea or wheezes Cardiovascular: Denies palpitation, chest discomfort or lower extremity swelling Gastrointestinal:  Denies nausea, heartburn or change in bowel habits Skin: Denies abnormal skin rashes Lymphatics: Denies new lymphadenopathy or easy bruising Neurological:Denies numbness, tingling or new weaknesses Behavioral/Psych: Mood is stable, no new changes  All other systems were reviewed with the patient and are negative.  I have reviewed the past medical history, past surgical history, social history and family history with the patient and they are unchanged from previous note.  ALLERGIES:  is allergic to paclitaxel, emend [fosaprepitant dimeglumine], codeine, nsaids, penicillins, vibra-tab [doxycycline], iodine, and latex.  MEDICATIONS:  Current Outpatient Medications  Medication Sig Dispense Refill   acetaminophen (TYLENOL) 500 MG tablet Take 2 tablets (1,000 mg total) by mouth every 6 (six) hours as needed for mild pain. 30 tablet 0   amLODipine (NORVASC) 5 MG tablet Take 1 tablet (5 mg total) by mouth daily. 90 tablet 3   aspirin EC 81 MG tablet Take 81 mg by mouth at bedtime. Swallow whole.     atenolol (TENORMIN)  25 MG tablet Take 0.5 tablets (12.5 mg total) by mouth 2 (two) times daily. 90 tablet 3   Calcium Carb-Cholecalciferol (CALCIUM + VITAMIN D3 PO) Take 1  tablet by mouth 2 (two) times daily.     EPINEPHrine 0.3 mg/0.3 mL IJ SOAJ injection Inject 0.3 mg into the muscle as needed for anaphylaxis. 2 each 2   levothyroxine (SYNTHROID) 100 MCG tablet Take 1 tablet (100 mcg total) by mouth in the morning. 90 tablet 3   Multiple Vitamin (MULTIVITAMIN) tablet Take 1 tablet by mouth in the morning.     olaparib (LYNPARZA) 100 MG tablet Take 1 tablet (100 mg total) by mouth 2 (two) times daily. Swallow whole. May take with food to decrease nausea and vomiting. 60 tablet 11   ondansetron (ZOFRAN) 4 MG tablet Take 1 tablet (4 mg total) by mouth every 6 (six) hours as needed for nausea. 20 tablet 0   simvastatin (ZOCOR) 20 MG tablet Take 1 tablet (20 mg total) by mouth daily. 90 tablet 3   No current facility-administered medications for this visit.    SUMMARY OF ONCOLOGIC HISTORY: Oncology History Overview Note  High grade papillary serous 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   Right ovarian epithelial cancer (HCC)  03/16/2022 Imaging   1. Large, mixed solid and cystic mass in the low pelvis, difficult to clearly delineate from adjacent ascites although at least 10.2 x 10.0 cm. This is highly concerning for primary ovarian malignancy and most likely arises from the right ovary, although may involve both ovaries. This may be further evaluated by contrast enhanced MRI of the pelvis if desired. 2. Large volume ascites throughout the abdomen and pelvis, presumed malignant. Stranding and nodularity of the omentum and peritoneum, particularly in the ventral abdomen and left upper quadrant, highly suspicious for peritoneal metastatic disease. 3. Cirrhotic morphology of the liver, which may contribute to ascites. 4. Severely atrophic left kidney, consistent with remote prior infectious, obstructive, or ischemic insult. 5. Coronary artery disease.   Aortic Atherosclerosis (ICD10-I70.0).   03/17/2022 Imaging   1. There is a large solid and cystic  mass within the right adnexa which measures up to 9.5 cm in maximum dimension. Findings are concerning for a primary ovarian neoplasm. 2. Large volume of ascites within the abdomen and pelvis. Given the presence of peritoneal nodules on the CT from the prior day findings are concerning for malignant ascites.   03/19/2022 Procedure   CT-guided biopsy of omental mass.    03/19/2022 Procedure   Successful ultrasound-guided paracentesis yielding 4.2 liters of peritoneal fluid.   03/19/2022 Procedure   Successful placement of a left internal jugular approach power injectable Port-A-Cath. The catheter is ready for immediate use.   03/30/2022 Initial Diagnosis   Ovarian cancer (HCC)   03/30/2022 Pathology Results   A. RIGHT OVARY AND FALLOPIAN TUBE, SALPINGO OOPHORECTOMY: Poorly differentiated carcinoma immunohistochemically most consistent with a high grade serous carcinoma Tumor measures 10.7 x 10.6 x 4.6 cm Tumor involves ovarian and mesosalpinx serosal surfaces and right fallopian tube pT2a pN n/a  Note: The tumor consists of sheets and cords of high-grade nuclei with areas of necrosis and adjacent prominent stroma suggesting papillary cores the overall morphology is slightly unusual and not classic; therefore, a battery of immunohistochemical stains were performed to confirm the diagnosis and exclude a high-grade germ cell tumor.  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  Additionally there is nonspecific focal PLAP positivity; CD117 positivity and D2-40 positivity of uncertain clinical significance.  The tumor is negative for AFP, hCG, CD30, CD31, CK20, glypican-3, and OCT 3/4.  Dr. Luisa Hart has peer reviewed the case and agrees with diagnosis. Dr. Venetia Night has also initiated the workup of the case and agrees with the interpretation.  B. OMENTUM, RESECTION: Benign omentum Negative  for tumor  C. RIGHT PELVIC SIDEWALL, BIOPSY: Benign mesothelium, fibrovascular stroma and adipose Negative for tumor  D. ANTERIOR CUL DE SAC, BIOPSY: Benign mesothelium Negative for tumor  E. LEFT PELVIC SIDEWALL, BIOPSY: Benign mesothelium and adipose Negative for tumor   ONCOLOGY TABLE:  OVARY or FALLOPIAN TUBE or PRIMARY PERITONEUM: Resection  Procedure: Right salpingo-oophorectomy Specimen Integrity: Grossly focally disrupted Tumor Site: Right ovary Tumor Size: 10.7 x 10.6 x 4.6 cm Histologic Type: Poorly differentiated carcinoma most consistent with a high-grade serous carcinoma Histologic Grade: High-grade Ovarian Surface Involvement: Surface involvement Fallopian Tube Surface Involvement: Mesosalpinx serosal surface involvement and right fallopian tube involvement Implants (required for advanced stage serous/seromucinous borderline tumors only): Not identified Other Tissue/ Organ Involvement: N/A Largest Extrapelvic Peritoneal Focus: N/A Peritoneal/Ascitic Fluid Involvement: N/A Chemotherapy Response Score (CRS): N/A, no known presurgical therapy Regional Lymph Nodes: N/A, no lymph nodes submitted Distant Metastasis:      Distant Site(s) Involved: N/A Pathologic Stage Classification (pTNM, AJCC 8th Edition): pT2a, pN N/A Ancillary Studies: Can be performed on physician request Representative Tumor Block: A8 Comment(s): [None]    03/30/2022 Surgery   Preop Diagnosis: Pelvic mass, malignant ascites   Postoperative Diagnosis: at least stage IIB ovarian cancer, suspected high grade serous by frozen section   Surgery: Diagnostic laparoscopy, right salpingo-oophorectomy, peritoneal biopsies, infra-colic omentectomy    Surgeon:  Carin Hock MD    Operative findings: On exam under anesthesia, somewhat mobile mass appreciated in the cul-de-sac to the right.  On agnostic laparoscopy, normal upper abdominal findings other than moderate volume ascites.  Normal-appearing  peritoneum, omentum, small bowel.  Right adnexa enlarged with a mass measuring approximately 8-10 cm.  No carcinomatosis appreciated.  Given this, decision made to convert to an open procedure for tumor debulking.  Approximately 5 L of yellow-tinged ascites was removed upon intra-abdominal entry.  Normal upper abdominal survey including smooth diaphragm and liver, stomach, and omentum.  The small bowel was run from the cecum to the ligament of Treitz with no abnormalities or carcinomatosis noted.  Right ovary replaced by an approximately 8 cm smooth mass, dilated and enlarged fallopian tube.  The mass itself was adherent to the right pelvic sidewall, sigmoid colon, and densely adherent to the anterior cul-de-sac/bladder peritoneum.  Frozen section consistent with invasive carcinoma, likely high-grade.  Left tube and ovary were surgically absent.  Sigmoid colon with adhesions to the left pelvic sidewall, left aspect of the bladder and the left vaginal cuff.  Somewhat redundant sigmoid which looped over on itself secondary to these adhesions.  Numerous diverticula noted of the sigmoid colon as well as the transverse colon.  The end of surgery, an R0 resection was accomplished.     04/13/2022 Cancer Staging   Staging form: Ovary, Fallopian Tube, and Primary Peritoneal Carcinoma, AJCC 8th Edition - Pathologic stage from 04/13/2022: Stage II (pT2, pN0, cM0) - Signed by [name], MD on 04/13/2022 Stage prefix: Initial diagnosis   04/26/2022 - 08/10/2022 Chemotherapy   Patient is on Treatment Plan : OVARIAN Carboplatin (AUC 6) + Paclitaxel (175) q21d X 6 Cycles     05/17/2022 Tumor Marker  Patient's tumor was tested for the following markers: CA-125. Results of the tumor marker test revealed 12.7.   06/06/2022 Genetic Testing   Germline: Negative [employer] Multi-Cancer +RNA Panel.  Report date is 06/06/2022.   The Multi-Cancer + RNA Panel offered by [employer] includes sequencing and/or deletion/duplication  analysis of the following 70 genes:  AIP*, ALK, APC*, ATM*, AXIN2*, BAP1*, BARD1*, BLM*, BMPR1A*, BRCA1*, BRCA2*, BRIP1*, CDC73*, CDH1*, CDK4, CDKN1B*, CDKN2A, CHEK2*, CTNNA1*, DICER1*, EPCAM (del/dup only), EGFR, FH*, FLCN*, GREM1 (promoter dup only), HOXB13, KIT, LZTR1, MAX*, MBD4, MEN1*, MET, MITF, MLH1*, MSH2*, MSH3*, MSH6*, MUTYH*, NF1*, NF2*, NTHL1*, PALB2*, PDGFRA, PMS2*, POLD1*, POLE*, POT1*, PRKAR1A*, PTCH1*, PTEN*, RAD51C*, RAD51D*, RB1*, RET, SDHA* (sequencing only), SDHAF2*, SDHB*, SDHC*, SDHD*, SMAD4*, SMARCA4*, SMARCB1*, SMARCE1*, STK11*, SUFU*, TMEM127*, TP53*, TSC1*, TSC2*, VHL*. RNA analysis is performed for * genes.  Somatic: Positive genomic instability score (GIS) of 62 through [facility] [employer].  No pathogenic variants detected in BRCA1/2.  Report date is June 10, 2022.   [employer] CDx includes sequencing and large rearrangement analysis of BRCA1/2 and genomic instability status through loss of heterozygosity (LOH), telomeric allelic imbalance (TAI) and large-scale state transitions (LST).     06/08/2022 Tumor Marker   Patient's tumor was tested for the following markers: Ca-125. Results of the tumor marker test revealed 16.5.   06/13/2022 - 06/17/2022 Hospital Admission   The patient was admitted to the hospital and found to have diarrhea, diverticulitis and pancytopenia   06/13/2022 Imaging   Colonic diverticulosis. Inflammatory stranding around the mid sigmoid colon compatible with active diverticulitis.   Interval resection of the previously seen lower pelvic solid and cystic mass and resolution of ascites.   Aortic atherosclerosis.   Severely atrophic left kidney, stable since prior study.   06/30/2022 Tumor Marker   Patient's tumor was tested for the following markers: CA-125. Results of the tumor marker test revealed 15.   07/21/2022 Tumor Marker   Patient's tumor was tested for the following markers: CA-125. Results of the tumor marker test revealed 11.4.    08/12/2022 Tumor Marker   Patient's tumor was tested for the following markers: CA-125. Results of the tumor marker test revealed 9.7.   08/14/2022 Imaging   1. Wall thickening of the sigmoid colon with adjacent inflammatory change, overall improved when compared with prior exam. New focal collection of gas involving the wall of the sigmoid colon which extends inferiorly to the area of the left vaginal cuff with contrast material seen within the vaginal cuff. 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 2. Wall thickening of the left lateral wall of the urinary bladder with air seen within the urinary bladder, findings raise concern for additional area of fistulization to the bladder. CT cystogram could be performed for better evaluation. 3. New pelviectasis and mild dilation of the left ureter, likely secondary to inflammatory change at the area of the left ureterovesicular junction. Chronic atrophy of the left kidney. 4. Increased wall thickening of a large colonic diverticulum of the proximal transverse colon, likely due to diverticulitis. 5. Aortic Atherosclerosis (ICD10-I70.0).     08/23/2022 Imaging   1. Increased large volume gas within the bladder. This raises concern for colovesical fistula. Consider CT cystogram for further evaluation. 2. Similar to slightly decreased inflammatory changes surrounding the sigmoid colon. The previous collection of gas involving the wall of the sigmoid colon has decreased. There is a small amount of residual gas in the area of the left vaginal cuff. There is again a soft tissue tract extending  from the vaginal cuff to the sigmoid colon, though no gas is seen within this tract. 3. Decreased size and wall thickening about the large diverticulum in the proximal transverse colon. 4. Slight decrease in pelviectasis and dilation of the left ureter.   Aortic Atherosclerosis (ICD10-I70.0).     08/27/2022 Imaging   1. Continued sigmoid wall thickening and  inflammatory reaction with diverticulosis with increased inflammation and layering reactive pelvic fluid since April 15. Further evaluation to exclude underlying colonic lesion is recommended when clinically feasible. 2. There is now a demonstrable fistulous tract from the sigmoid colon into the left dorsal bladder wall, with air and fluid in the bladder and patchy enteric contrast now settling in the dependent bladder. 3. The known left-sided colovaginal fistula is not well seen today but there is contrast in the vagina consistent with continued tract patency. 4. There is now moderate bilateral hydroureteronephrosis into the pelvis where the ureters are difficult to follow due to the pre-existing inflammatory process, with ureteral tethering by inflammatory process most likely causing the obstructive uropathy. No stone disease is seen. 5. Severe chronic atrophy of the left kidney. 6. Aortic and coronary artery atherosclerosis. 7. Anemia. 8. Prominent common bile duct at 12 mm, unchanged. 9. Constipation. 10. Osteopenia, scoliosis, and advanced degenerative change of the lumbar spine with acquired spinal stenosis L4-5.   09/01/2022 Surgery   Preoperative diagnosis: colovesical fistula   Postoperative diagnosis: same    Procedure: laparoscopic converted to open sigmoid resection with anastomosis, diverting loop ileostomy, take down of colovesical fistula   Surgeon: Feliciana Rossetti, M.D.   Asst: Carl Best, [facility]   Anesthesia: GETA   Indications for procedure: GENEICE SCHANK is a 77 y.o. year old female with symptoms of recurrent UTIs and abdominal pain.   Description of procedure: The patient was brought into the operative suite. Anesthesia was administered with General endotracheal anesthesia. WHO checklist was applied. The patient was then placed in lithotomy position. The area was prepped and draped in the usual sterile fashion.   Dr. Laverle Patter performed a cysto and plans ureteral  stents prior to surgery.   Next, a left subcostal incision was made. A 5mm trocar was used to gain access to the peritoneal cavity by optical entry technique. Pneumoperitoneum was applied with a high flow and low pressure. The laparoscope was reinserted to confirm position.   3 additional trocars were placed 1 5 mm trocar in the right upper abdomen, 1 5 mm trocar in the infraumbilical space, and 1 12 mm trocar in the right lower quadrant.   There were no adhesions to the abdominal wall. The sigmoid colon was thickened and chronically inflamed. There was one loop of small intestine adhesed to the sigmoid colon, this was sharply dissected free. The White line of Toldt was incised. Blunt dissection was used to free up the colon from the bladder and abdominal wall. This was very difficult. During this dissection Dr. Melynda Keller came in to look at the abdomen and determined no evidence of cancer was present. After about 30 minutes of laparoscopic dissection, I did not think I could safely complete dissection laparoscopically and so a lower midline incision was made.   Next, palpation identified the ureters which were well away from the dissection. Blunt dissection was able to free the colon completely from the bladder and side wall. Next, a blue load contour stapler was used to divide the distal sigmoid colon. The mesentery was taken with ligasure device. The left colon was fully mobilized  a portion of colon proximal to the inflammatory changes was purse stringed and divided distally. A 29 mm anvil was placed into the colon and purse string tied down.    Next, Dr. Laverle Patter came in to repair the bladder. See his dictation for more information.   The rectum was mobilized more by freeing it from the side walls. At this point I went below to palpate the rectum, there was some difficulty to get the dilator to reach the staple line. There was an attempt to pass the stapler but this led to a tear in the distal rectum. Dr.  Michaell Cowing came in at this time to assist and a further margin of sigmoid/rectum was taken with the contour stapler. This allowed the stapler to come to the end and anastomosis was performed. Leak test showed a small leak distal to the staple line that was repaired with interrupted 3-0 vicryl. Due to this leak and thin tissue at the area, decision was made to divert with loop ileostomy.   The 12 mm trocar site was enlarged and a portion of distal ileum brought through the hole. A 19 fr blake drain was brought through RUQ incision and tip placed into the pelvis. The abdomen was irrigated. Hemostasis was intact. The peritoneum was closed with 2-0 vicryl. The fascia was closed with 0 PDS. The ileostomy was matured with 3-0 and 2-3 cm of Brooking. The skin was closed with staples. Ileostomy bag was placed. All counts were correct.   Findings: colovesical fistula, dense inflammation of the sigmoid colon   09/01/2022 Surgery   Preoperative diagnosis: 1.  Colovesical fistula 2.  Atrophic left kidney 3.  Right hydronephrosis   Postoperative diagnosis: 1.  Colovesical fistula 2.  Atrophic left kidney 3.  Right hydronephrosis   Procedures: 1.  Cystoscopy 2.  Bilateral retrograde pyelography with interpretation 3.  Bilateral ureteral stent placement (right -6 x 24, left -6 x 26) 4.  Repair of bladder fistula   10/01/2022 Tumor Marker   Patient's tumor was tested for the following markers: CA-125. Results of the tumor marker test revealed 14.   10/11/2022 -  Chemotherapy   She started taking olaparib   10/25/2022 Tumor Marker   Patient's tumor was tested for the following markers: CA-125. Results of the tumor marker test revealed 10.6.   11/15/2022 Procedure   Successful removal of implanted Port-A-Cath.    02/14/2023 Tumor Marker   Patient's tumor was tested for the following markers: CA-125. Results of the tumor marker test revealed 9.5.   03/14/2023 Imaging   CT ABDOMEN PELVIS WO  CONTRAST  Result Date: 03/18/2023 CLINICAL DATA:  Ovarian cancer.  Restaging.  * Tracking Code: BO * EXAM: CT ABDOMEN AND PELVIS WITHOUT CONTRAST TECHNIQUE: Multidetector CT imaging of the abdomen and pelvis was performed following the standard protocol without IV contrast. RADIATION DOSE REDUCTION: This exam was performed according to the departmental dose-optimization program which includes automated exposure control, adjustment of the mA and/or kV according to patient size and/or use of iterative reconstruction technique. COMPARISON:  08/27/2022 FINDINGS: Lower chest: Unremarkable. Hepatobiliary: Stable tiny hypodensity in the central liver. 6 mm hypodensity subcapsular right liver on 28/2 is stable. There is no evidence for gallstones, gallbladder wall thickening, or pericholecystic fluid. No intrahepatic or extrahepatic biliary dilation. Pancreas: No focal mass lesion. No dilatation of the main duct. No intraparenchymal cyst. No peripancreatic edema. Spleen: No splenomegaly. No suspicious focal mass lesion. Adrenals/Urinary Tract: No adrenal nodule or mass. Left kidney markedly atrophic with  stable small cyst. No followup imaging is recommended. No hydronephrosis in the right kidney. Tiny hypodensity upper pole right kidney is too small to characterize but likely benign. No evidence for hydroureter. The urinary bladder appears normal for the degree of distention. Stomach/Bowel: Stomach is unremarkable. No gastric wall thickening. No evidence of outlet obstruction. Duodenum is normally positioned as is the ligament of Treitz. Large duodenal diverticulum again noted. No small bowel wall thickening. No small bowel dilatation. Diverticuli are seen scattered along the entire length of the colon without CT findings of diverticulitis. Interval left hemicolectomy with rectosigmoid anastomosis. Vascular/Lymphatic: There is moderate atherosclerotic calcification of the abdominal aorta without aneurysm. There is no  gastrohepatic or hepatoduodenal ligament lymphadenopathy. No retroperitoneal or mesenteric lymphadenopathy. No pelvic sidewall lymphadenopathy. Reproductive: Hysterectomy.  There is no adnexal mass. Other: No intraperitoneal free fluid. Musculoskeletal: No worrisome lytic or sclerotic osseous abnormality. 11 mm subcutaneous nodule anterior right abdominal wall on 46/2 is new in the interval. IMPRESSION: 1. Interval left hemicolectomy with rectosigmoid anastomosis. 2. 11 mm subcutaneous nodule anterior right abdominal wall is new in the interval. This is nonspecific and may be related to recent injection. Close follow-up recommended. 3. No findings of recurrent or metastatic disease on the current exam. 4.  Aortic Atherosclerosis (ICD10-I70.0). Electronically Signed   By: Kennith Center M.D.   On: 03/18/2023 10:30   MM 3D DIAGNOSTIC MAMMOGRAM UNILATERAL LEFT BREAST  Result Date: 03/15/2023 CLINICAL DATA:  Interval follow-up of a likely benign focal asymmetry in the LEFT breast. Personal history of malignant RIGHT mastectomy and excisional biopsy of a complex sclerosing lesion from the LEFT breast in May, 2023. EXAM: DIGITAL DIAGNOSTIC UNILATERAL LEFT MAMMOGRAM WITH TOMOSYNTHESIS AND CAD TECHNIQUE: Left digital diagnostic mammography and breast tomosynthesis was performed. The images were evaluated with computer-aided detection. COMPARISON:  Previous exams, most recently May and June, 2023. ACR Breast Density Category b: There are scattered areas of fibroglandular density. FINDINGS: Full field CC and MLO views were obtained. No findings suspicious for malignancy. The focal asymmetry in the outer breast questioned previously represents normal fibroglandular tissue. IMPRESSION: No mammographic evidence of malignancy involving the LEFT breast. RECOMMENDATION: Screening LEFT mammogram in one year.(Code:SM-B-01Y) I have discussed the findings and recommendations with the patient. If applicable, a reminder letter will be  sent to the patient regarding the next appointment. BI-RADS CATEGORY  1: Negative. Electronically Signed   By: Hulan Saas M.D.   On: 03/15/2023 11:24      03/16/2023 Tumor Marker   Patient's tumor was tested for the following markers: CA-125. Results of the tumor marker test revealed 7.8.     PHYSICAL EXAMINATION: ECOG PERFORMANCE STATUS: 0 - Asymptomatic  Vitals:   03/18/23 1135  BP: (!) 150/77  Pulse: 60  Resp: 18  Temp: 98.3 F (36.8 C)  SpO2: 100%   Filed Weights   03/18/23 1135  Weight: 146 lb 12.8 oz (66.6 kg)    GENERAL:alert, no distress and comfortable ABDOMEN:abdomen soft, non-tender and normal bowel sounds.  The skin nodule that was reported on CT is not palpable on exam Musculoskeletal:no cyanosis of digits and no clubbing  NEURO: alert & oriented x 3 with fluent speech, no focal motor/sensory deficits  LABORATORY DATA:  I have reviewed the data as listed    Component Value Date/Time   NA 136 03/14/2023 0959   NA 135 11/25/2022 0935   K 3.4 (L) 03/14/2023 0959   CL 104 03/14/2023 0959   CO2 26 03/14/2023 0959  GLUCOSE 89 03/14/2023 0959   BUN 23 03/14/2023 0959   BUN 21 11/25/2022 0935   CREATININE 1.44 (H) 03/14/2023 0959   CREATININE 0.93 08/10/2022 0857   CREATININE 0.99 02/05/2013 0757   CALCIUM 9.5 03/14/2023 0959   PROT 6.6 03/14/2023 0959   PROT 6.5 11/25/2022 0935   ALBUMIN 4.1 03/14/2023 0959   ALBUMIN 4.2 11/25/2022 0935   AST 16 03/14/2023 0959   AST 12 (L) 08/10/2022 0857   ALT 12 03/14/2023 0959   ALT 11 08/10/2022 0857   ALKPHOS 49 03/14/2023 0959   BILITOT 0.5 03/14/2023 0959   BILITOT 0.5 11/25/2022 0935   BILITOT 0.3 08/10/2022 0857   GFRNONAA 37 (L) 03/14/2023 0959   GFRNONAA >60 08/10/2022 0857   GFRNONAA 59 (L) 02/05/2013 0757   GFRAA 61 05/20/2020 0850   GFRAA 68 02/05/2013 0757    No results found for: "SPEP", "UPEP"  Lab Results  Component Value Date   WBC 6.6 03/14/2023   NEUTROABS 5.1 03/14/2023   HGB  11.6 (L) 03/14/2023   HCT 33.2 (L) 03/14/2023   MCV 96.0 03/14/2023   PLT 201 03/14/2023      Chemistry      Component Value Date/Time   NA 136 03/14/2023 0959   NA 135 11/25/2022 0935   K 3.4 (L) 03/14/2023 0959   CL 104 03/14/2023 0959   CO2 26 03/14/2023 0959   BUN 23 03/14/2023 0959   BUN 21 11/25/2022 0935   CREATININE 1.44 (H) 03/14/2023 0959   CREATININE 0.93 08/10/2022 0857   CREATININE 0.99 02/05/2013 0757      Component Value Date/Time   CALCIUM 9.5 03/14/2023 0959   ALKPHOS 49 03/14/2023 0959   AST 16 03/14/2023 0959   AST 12 (L) 08/10/2022 0857   ALT 12 03/14/2023 0959   ALT 11 08/10/2022 0857   BILITOT 0.5 03/14/2023 0959   BILITOT 0.5 11/25/2022 0935   BILITOT 0.3 08/10/2022 0857       RADIOGRAPHIC STUDIES: I have reviewed multiple imaging study with the patient I have personally reviewed the radiological images as listed and agreed with the findings in the report. CT ABDOMEN PELVIS WO CONTRAST  Result Date: 03/18/2023 CLINICAL DATA:  Ovarian cancer.  Restaging.  * Tracking Code: BO * EXAM: CT ABDOMEN AND PELVIS WITHOUT CONTRAST TECHNIQUE: Multidetector CT imaging of the abdomen and pelvis was performed following the standard protocol without IV contrast. RADIATION DOSE REDUCTION: This exam was performed according to the departmental dose-optimization program which includes automated exposure control, adjustment of the mA and/or kV according to patient size and/or use of iterative reconstruction technique. COMPARISON:  08/27/2022 FINDINGS: Lower chest: Unremarkable. Hepatobiliary: Stable tiny hypodensity in the central liver. 6 mm hypodensity subcapsular right liver on 28/2 is stable. There is no evidence for gallstones, gallbladder wall thickening, or pericholecystic fluid. No intrahepatic or extrahepatic biliary dilation. Pancreas: No focal mass lesion. No dilatation of the main duct. No intraparenchymal cyst. No peripancreatic edema. Spleen: No splenomegaly. No  suspicious focal mass lesion. Adrenals/Urinary Tract: No adrenal nodule or mass. Left kidney markedly atrophic with stable small cyst. No followup imaging is recommended. No hydronephrosis in the right kidney. Tiny hypodensity upper pole right kidney is too small to characterize but likely benign. No evidence for hydroureter. The urinary bladder appears normal for the degree of distention. Stomach/Bowel: Stomach is unremarkable. No gastric wall thickening. No evidence of outlet obstruction. Duodenum is normally positioned as is the ligament of Treitz. Large duodenal diverticulum again noted.  No small bowel wall thickening. No small bowel dilatation. Diverticuli are seen scattered along the entire length of the colon without CT findings of diverticulitis. Interval left hemicolectomy with rectosigmoid anastomosis. Vascular/Lymphatic: There is moderate atherosclerotic calcification of the abdominal aorta without aneurysm. There is no gastrohepatic or hepatoduodenal ligament lymphadenopathy. No retroperitoneal or mesenteric lymphadenopathy. No pelvic sidewall lymphadenopathy. Reproductive: Hysterectomy.  There is no adnexal mass. Other: No intraperitoneal free fluid. Musculoskeletal: No worrisome lytic or sclerotic osseous abnormality. 11 mm subcutaneous nodule anterior right abdominal wall on 46/2 is new in the interval. IMPRESSION: 1. Interval left hemicolectomy with rectosigmoid anastomosis. 2. 11 mm subcutaneous nodule anterior right abdominal wall is new in the interval. This is nonspecific and may be related to recent injection. Close follow-up recommended. 3. No findings of recurrent or metastatic disease on the current exam. 4.  Aortic Atherosclerosis (ICD10-I70.0). Electronically Signed   By: Kennith Center M.D.   On: 03/18/2023 10:30   MM 3D DIAGNOSTIC MAMMOGRAM UNILATERAL LEFT BREAST  Result Date: 03/15/2023 CLINICAL DATA:  Interval follow-up of a likely benign focal asymmetry in the LEFT breast. Personal  history of malignant RIGHT mastectomy and excisional biopsy of a complex sclerosing lesion from the LEFT breast in May, 2023. EXAM: DIGITAL DIAGNOSTIC UNILATERAL LEFT MAMMOGRAM WITH TOMOSYNTHESIS AND CAD TECHNIQUE: Left digital diagnostic mammography and breast tomosynthesis was performed. The images were evaluated with computer-aided detection. COMPARISON:  Previous exams, most recently May and June, 2023. ACR Breast Density Category b: There are scattered areas of fibroglandular density. FINDINGS: Full field CC and MLO views were obtained. No findings suspicious for malignancy. The focal asymmetry in the outer breast questioned previously represents normal fibroglandular tissue. IMPRESSION: No mammographic evidence of malignancy involving the LEFT breast. RECOMMENDATION: Screening LEFT mammogram in one year.(Code:SM-B-01Y) I have discussed the findings and recommendations with the patient. If applicable, a reminder letter will be sent to the patient regarding the next appointment. BI-RADS CATEGORY  1: Negative. Electronically Signed   By: Hulan Saas M.D.   On: 03/15/2023 11:24

## 2023-03-18 NOTE — Assessment & Plan Note (Signed)
I have reviewed multiple imaging studies with the patient The area of skin nodule that was reported is not palpable today on exam but will be monitored closely We discussed tumor marker monitoring and future follow-up She will get another appointment again in 2 months with port flushes and she will continue olaparib She will see GYN surgeon in 3 months I plan to repeat imaging study again in 4 to 6 months

## 2023-03-18 NOTE — Assessment & Plan Note (Signed)
This is likely anemia of chronic disease. The patient denies recent history of bleeding such as epistaxis, hematuria or hematochezia. She is asymptomatic from the anemia. We will observe for now.   Overall, her hemoglobin is improving

## 2023-04-04 ENCOUNTER — Encounter: Payer: Self-pay | Admitting: Family Medicine

## 2023-04-04 ENCOUNTER — Ambulatory Visit (INDEPENDENT_AMBULATORY_CARE_PROVIDER_SITE_OTHER): Payer: Medicare Other | Admitting: Family Medicine

## 2023-04-04 VITALS — BP 133/75 | HR 68 | Temp 98.7°F | Ht 66.0 in | Wt 146.8 lb

## 2023-04-04 DIAGNOSIS — I10 Essential (primary) hypertension: Secondary | ICD-10-CM | POA: Diagnosis not present

## 2023-04-04 DIAGNOSIS — N1831 Chronic kidney disease, stage 3a: Secondary | ICD-10-CM | POA: Diagnosis not present

## 2023-04-04 DIAGNOSIS — Z9889 Other specified postprocedural states: Secondary | ICD-10-CM

## 2023-04-04 MED ORDER — ATENOLOL 25 MG PO TABS
12.5000 mg | ORAL_TABLET | Freq: Two times a day (BID) | ORAL | 3 refills | Status: DC
Start: 2023-04-04 — End: 2023-10-18

## 2023-04-04 NOTE — Progress Notes (Signed)
Subjective: CC:HTN CKD PCP: Raliegh Ip, DO ZOX:[id number] April Weber is April 77 y.o. female presenting to clinic today for:  1. HTN associated with CKD3b Compliant with atenolol and norvasc. Has been off ARB for April while now. BPs controlled. She's been eating whatever she wants lately because BP and renal function have been stable.  She continues to have diarrhea, presumably from meds for ovarian Ca.  She has done with s/p ileostomy reversal. Has appt with Dr Wolfgang Phoenix on the 11th.  No CP, SOB, swelling reported.   ROS: Per HPI  Allergies  Allergen Reactions   Paclitaxel Anaphylaxis   Emend [Fosaprepitant Dimeglumine] Other (See Comments)    3 minutes into emend infusion patient began having facial flushing and redness to bilateral hands and neck, and light headedness   Codeine Nausea And Vomiting   Nsaids Other (See Comments)    Told to avoid due to kidney function   Penicillins Hives   Vibra-Tab [Doxycycline] Hives   Iodine Rash   Latex Rash    Occasionally gets April localized rash on contact with certain latex products   Past Medical History:  Diagnosis Date   Arthritis    Breast cancer (HCC) 1980   RIGHT   Chronic kidney disease    has one kidney   Diverticulitis of colon    DVT (deep venous thrombosis) (HCC)    on RIGHT leg-hx of, superficial   Dyspnea    Headache    occ migraine   Hyperlipidemia    on meds   Hypertension    on meds   Osteopenia    Ovarian cancer (HCC) 03/2022   Peripheral vascular disease (HCC)    PONV (postoperative nausea and vomiting)    Seasonal allergies    Thyroid disease    on meds    Current Outpatient Medications:    acetaminophen (TYLENOL) 500 MG tablet, Take 2 tablets (1,000 mg total) by mouth every 6 (six) hours as needed for mild pain., Disp: 30 tablet, Rfl: 0   amLODipine (NORVASC) 5 MG tablet, Take 1 tablet (5 mg total) by mouth daily., Disp: 90 tablet, Rfl: 3   aspirin EC 81 MG tablet, Take 81 mg by mouth at bedtime. Swallow  whole., Disp: , Rfl:    atenolol (TENORMIN) 25 MG tablet, Take 0.5 tablets (12.5 mg total) by mouth 2 (two) times daily., Disp: 90 tablet, Rfl: 3   Calcium Carb-Cholecalciferol (CALCIUM + VITAMIN D3 PO), Take 1 tablet by mouth 2 (two) times daily., Disp: , Rfl:    EPINEPHrine 0.3 mg/0.3 mL IJ SOAJ injection, Inject 0.3 mg into the muscle as needed for anaphylaxis., Disp: 2 each, Rfl: 2   levothyroxine (SYNTHROID) 100 MCG tablet, Take 1 tablet (100 mcg total) by mouth in the morning., Disp: 90 tablet, Rfl: 3   Multiple Vitamin (MULTIVITAMIN) tablet, Take 1 tablet by mouth in the morning., Disp: , Rfl:    olaparib (LYNPARZA) 100 MG tablet, Take 1 tablet (100 mg total) by mouth 2 (two) times daily. Swallow whole. 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  Tobacco Use   Smoking status: Former    Current packs/day: 0.00    Types: Cigarettes    Quit date: 05/11/1967    Years since quitting: 55.9    Passive exposure: Past   Smokeless tobacco: Never  Vaping Use   Vaping status: Never Used  Substance and Sexual Activity   Alcohol use: No   Drug use: No   Sexual activity: Not Currently    Birth control/protection: Post-menopausal, Surgical    Comment: hyst  Other Topics Concern   Not on file  Social History Narrative   Lives alone - very close with her neighbors   Social Determinants of Health   Financial  Resource Strain: Low Risk  (01/25/2023)   Overall Financial Resource Strain (CARDIA)    Difficulty of Paying Living Expenses: Not hard at all  Food Insecurity: No Food Insecurity (01/25/2023)   Hunger Vital Sign    Worried About Running Out of Food in the Last Year: Never true    Ran Out of Food in the Last Year: Never true  Transportation Needs: No Transportation Needs (01/25/2023)   PRAPARE - Administrator, Civil Service (Medical): No    Lack of Transportation (Non-Medical): No  Physical Activity: Insufficiently Active (01/25/2023)   Exercise Vital Sign    Days of Exercise per Week: 3 days    Minutes of Exercise per Session: 30 min  Stress: No Stress Concern Present (01/25/2023)   [employer] of Occupational Health - Occupational Stress Questionnaire    Feeling of Stress : Not at all  Social Connections: Unknown (01/25/2023)   Social Connection and Isolation Panel [NHANES]    Frequency of Communication with Friends and Family: More than three times April week    Frequency of Social Gatherings with Friends and Family: More than three times April week    Attends Religious Services: More than 4 times per year    Active Member of [employer] or Organizations: Yes    Attends Banker Meetings: More than 4 times per year    Marital Status: Patient declined  Intimate Partner Violence: Not At Risk (01/25/2023)   Humiliation, Afraid, Rape, and Kick questionnaire    Fear of Current or Ex-Partner: No    Emotionally Abused: No    Physically Abused: No    Sexually Abused: No   Family History  Problem Relation Age of Onset   Heart attack Mother    Alcohol abuse Mother    Lung cancer Mother        d. mid 10s   Breast cancer Maternal Aunt 55   Colon polyps Neg Hx    Colon cancer Neg Hx    Esophageal cancer Neg Hx    Stomach cancer Neg Hx    Rectal cancer Neg Hx     Objective: Office vital signs reviewed. BP 133/75   Pulse 68   Temp 98.7 F (37.1 C)   Ht 5\' 6"  (1.676 m)    Wt 146 lb 12.8 oz (66.6 kg)   SpO2 99%   BMI 23.69 kg/m   Physical Examination:  General: Awake, alert, well nourished, No acute distress HEENT: sclera white, MMM Cardio: regular rate and rhythm, S1S2 heard, no murmurs appreciated Pulm: clear to auscultation bilaterally,  no wheezes, rhonchi or rales; normal work of breathing on room air GI: soft, non-tender, non-distended, bowel sounds present x4, no hepatomegaly, no splenomegaly, no masses    Assessment/ Plan: 77 y.o. female   Essential hypertension - Plan: atenolol (TENORMIN) 25 MG tablet  Stage 3a chronic kidney disease (HCC)  History of reversal of ileostomy  BP controlled. No changes, BB renewed. Continue CCB  Has appt to est care with Dr Wolfgang Phoenix in 2 weeks. Anticipate labs.  Persistent diarrhea. Likely from meds for ovarian cancer.     Raliegh Ip, DO [facility] [phone]

## 2023-04-20 DIAGNOSIS — N270 Small kidney, unilateral: Secondary | ICD-10-CM | POA: Diagnosis not present

## 2023-04-20 DIAGNOSIS — D638 Anemia in other chronic diseases classified elsewhere: Secondary | ICD-10-CM | POA: Diagnosis not present

## 2023-04-20 DIAGNOSIS — C569 Malignant neoplasm of unspecified ovary: Secondary | ICD-10-CM | POA: Diagnosis not present

## 2023-04-20 DIAGNOSIS — N1832 Chronic kidney disease, stage 3b: Secondary | ICD-10-CM | POA: Diagnosis not present

## 2023-04-28 ENCOUNTER — Ambulatory Visit: Payer: Medicare Other | Admitting: Dietician

## 2023-05-06 ENCOUNTER — Emergency Department (HOSPITAL_BASED_OUTPATIENT_CLINIC_OR_DEPARTMENT_OTHER)
Admission: EM | Admit: 2023-05-06 | Discharge: 2023-05-06 | Disposition: A | Discharge status: Home / Self Care | Payer: Medicare Other | Attending: Emergency Medicine | Admitting: Emergency Medicine

## 2023-05-06 ENCOUNTER — Emergency Department (HOSPITAL_BASED_OUTPATIENT_CLINIC_OR_DEPARTMENT_OTHER): Payer: Medicare Other | Admitting: Radiology

## 2023-05-06 ENCOUNTER — Encounter (HOSPITAL_BASED_OUTPATIENT_CLINIC_OR_DEPARTMENT_OTHER): Payer: Self-pay | Admitting: Emergency Medicine

## 2023-05-06 ENCOUNTER — Other Ambulatory Visit: Payer: Self-pay

## 2023-05-06 ENCOUNTER — Emergency Department (HOSPITAL_BASED_OUTPATIENT_CLINIC_OR_DEPARTMENT_OTHER): Payer: Medicare Other

## 2023-05-06 DIAGNOSIS — Z79899 Other long term (current) drug therapy: Secondary | ICD-10-CM | POA: Insufficient documentation

## 2023-05-06 DIAGNOSIS — Z7982 Long term (current) use of aspirin: Secondary | ICD-10-CM | POA: Insufficient documentation

## 2023-05-06 DIAGNOSIS — I129 Hypertensive chronic kidney disease with stage 1 through stage 4 chronic kidney disease, or unspecified chronic kidney disease: Secondary | ICD-10-CM | POA: Insufficient documentation

## 2023-05-06 DIAGNOSIS — Z91040 Latex allergy status: Secondary | ICD-10-CM | POA: Insufficient documentation

## 2023-05-06 DIAGNOSIS — S92352A Displaced fracture of fifth metatarsal bone, left foot, initial encounter for closed fracture: Secondary | ICD-10-CM | POA: Diagnosis not present

## 2023-05-06 DIAGNOSIS — Z853 Personal history of malignant neoplasm of breast: Secondary | ICD-10-CM | POA: Diagnosis not present

## 2023-05-06 DIAGNOSIS — N189 Chronic kidney disease, unspecified: Secondary | ICD-10-CM | POA: Diagnosis not present

## 2023-05-06 DIAGNOSIS — E039 Hypothyroidism, unspecified: Secondary | ICD-10-CM | POA: Diagnosis not present

## 2023-05-06 DIAGNOSIS — M79672 Pain in left foot: Secondary | ICD-10-CM | POA: Diagnosis present

## 2023-05-06 DIAGNOSIS — M2012 Hallux valgus (acquired), left foot: Secondary | ICD-10-CM | POA: Diagnosis not present

## 2023-05-06 DIAGNOSIS — M19072 Primary osteoarthritis, left ankle and foot: Secondary | ICD-10-CM | POA: Diagnosis not present

## 2023-05-06 DIAGNOSIS — M84475A Pathological fracture, left foot, initial encounter for fracture: Secondary | ICD-10-CM | POA: Diagnosis not present

## 2023-05-06 MED ORDER — ACETAMINOPHEN 500 MG PO TABS
1000.0000 mg | ORAL_TABLET | Freq: Once | ORAL | Status: AC
  Administered 2023-05-06: 1000 mg via ORAL
  Filled 2023-05-06: qty 2

## 2023-05-06 NOTE — ED Provider Notes (Signed)
[place] EMERGENCY DEPARTMENT AT [facility] Provider Note   CSN: [id number] Arrival date & time: 05/06/23  1143     History {Add pertinent medical, surgical, social history, OB history to HPI:1} Chief Complaint  Patient presents with   Foot Injury    April Weber is a 77 y.o. female diverticulosis, one kidney, ovarian (chemo pills)  Putting down christmas decorations and was turning left with loose shoe and stepped on side of shoe and twisted her foot. Able to walk on it. No additional complaints.  Emerg ortho  Foot Injury      Home Medications Prior to Admission medications   Medication Sig Start Date End Date Taking? Authorizing Provider  acetaminophen (TYLENOL) 500 MG tablet Take 2 tablets (1,000 mg total) by mouth every 6 (six) hours as needed for mild pain. 09/08/22   Tyrone Nine, MD  amLODipine (NORVASC) 5 MG tablet Take 1 tablet (5 mg total) by mouth daily. 11/30/22   Raliegh Ip, DO  aspirin EC 81 MG tablet Take 81 mg by mouth at bedtime. Swallow whole.    [provider]  atenolol (TENORMIN) 25 MG tablet Take 0.5 tablets (12.5 mg total) by mouth 2 (two) times daily. 04/04/23   Raliegh Ip, DO  Calcium Carb-Cholecalciferol (CALCIUM + VITAMIN D3 PO) Take 1 tablet by mouth 2 (two) times daily.    [provider]  EPINEPHrine 0.3 mg/0.3 mL IJ SOAJ injection Inject 0.3 mg into the muscle as needed for anaphylaxis. 05/24/22   Daphine Deutscher Mary-Margaret, FNP  levothyroxine (SYNTHROID) 100 MCG tablet Take 1 tablet (100 mcg total) by mouth in the morning. 11/30/22   Raliegh Ip, DO  Multiple Vitamin (MULTIVITAMIN) tablet Take 1 tablet by mouth in the morning.    [provider]  olaparib (LYNPARZA) 100 MG tablet Take 1 tablet (100 mg total) by mouth 2 (two) times daily. Swallow whole. May take with food to decrease nausea and vomiting. 08/10/22   [name], MD  ondansetron (ZOFRAN) 4 MG tablet Take 1 tablet (4 mg total)  by mouth every 6 (six) hours as needed for nausea. 09/08/22   Tyrone Nine, MD  simvastatin (ZOCOR) 20 MG tablet Take 1 tablet (20 mg total) by mouth daily. 11/30/22   Raliegh Ip, DO      Allergies    Paclitaxel, Emend [fosaprepitant dimeglumine], Codeine, Nsaids, Penicillins, Vibra-tab [doxycycline], Iodine, and Latex    Review of Systems   Review of Systems  Physical Exam Updated Vital Signs BP 137/82   Pulse 72   Temp 97.6 F (36.4 C)   Resp 14   Ht 5\' 6"  (1.676 m)   Wt 63 kg   SpO2 100%   BMI 22.44 kg/m  Physical Exam  ED Results / Procedures / Treatments   Labs (all labs ordered are listed, but only abnormal results are displayed) Labs Reviewed - No data to display  EKG None  Radiology DG Foot Complete Left Result Date: 05/06/2023 CLINICAL DATA:  Fall. EXAM: LEFT FOOT - COMPLETE 3+ VIEW COMPARISON:  None Available. FINDINGS: There is an acute oblique mildly displaced fracture of the distal portion of the fifth metatarsal. No intra-articular extension. No other acute fracture or dislocation. No aggressive osseous lesion. Mild hallux valgus deformity noted. There are mild diffuse degenerative changes of imaged joints. No focal soft tissue swelling. No radiopaque foreign bodies. IMPRESSION: *Oblique mildly displaced fracture of the distal portion of fifth metatarsal. Electronically Signed   By: Rhea Belton  Ramiro Harvest M.D.   On: 05/06/2023 14:02    Procedures Procedures  {Document cardiac monitor, telemetry assessment procedure when appropriate:1}  Medications Ordered in ED Medications - No data to display  ED Course/ Medical Decision Making/ A&P   {   Click here for ABCD2, HEART and other calculatorsREFRESH Note before signing :1}                              Medical Decision Making Amount and/or Complexity of Data Reviewed Radiology: ordered.  Risk OTC drugs.     Patient presents to the ED for concern of ***, this involves an extensive number of treatment  options, and is a complaint that carries with it a high risk of complications and morbidity.  The differential diagnosis includes ***   Co morbidities that complicate the patient evaluation  ***   Additional history obtained:  Additional history obtained from *** {Blank multiple:19196::"EMS","Family","Nursing","Outside Medical Records","Past Admission"}   External records from outside source obtained and reviewed including ***   Lab Tests:  I Ordered, and personally interpreted labs.  The pertinent results include:  ***   Imaging Studies ordered:  I ordered imaging studies including ***  I independently visualized and interpreted imaging which showed *** I agree with the radiologist interpretation   Cardiac Monitoring:  The patient was maintained on a cardiac monitor.  I personally viewed and interpreted the cardiac monitored which showed an underlying rhythm of: ***   Medicines ordered and prescription drug management:  I ordered medication including tylenol  for pain as requested by patient Reevaluation of the patient after these medicines showed that the patient improved I have reviewed the patients home medicines and have made adjustments as needed    Consultations Obtained:  I requested consultation with orthopedics Dr. Venita Lick,  and discussed lab and imaging findings as well as pertinent plan - they recommend: CAM book and outpatient f/u in one week   Problem List / ED Course:  ***   Reevaluation:  After the interventions noted above, I reevaluated the patient and found that they have :{resolved/improved/worsened:23923::"improved"}   Social Determinants of Health:  ***   Dispostion:  After consideration of the diagnostic results and the patients response to treatment, I feel that the patent would benefit from ***.    {Document critical care time when appropriate:1} {Document review of labs and clinical decision tools ie heart score,  Chads2Vasc2 etc:1}  {Document your independent review of radiology images, and any outside records:1} {Document your discussion with family members, caretakers, and with consultants:1} {Document social determinants of health affecting pt's care:1} {Document your decision making why or why not admission, treatments were needed:1} Final Clinical Impression(s) / ED Diagnoses Final diagnoses:  None    Rx / DC Orders ED Discharge Orders     None

## 2023-05-06 NOTE — Discharge Instructions (Addendum)
Thank you for letting us evaluate you today! Nice to meet you :) Your imaging was significant for a fracture of your left toe.  I have talked to orthopedics who recommend a boot and to follow-up in about 1 week.  I provided their information above so you can make an appointment.

## 2023-05-06 NOTE — ED Triage Notes (Signed)
States left twisting foot this morning and painful to walk. Denies falling. Does not appear swollen.

## 2023-05-12 ENCOUNTER — Telehealth: Payer: Self-pay | Admitting: Pharmacy Technician

## 2023-05-12 ENCOUNTER — Other Ambulatory Visit (HOSPITAL_COMMUNITY): Payer: Self-pay

## 2023-05-12 ENCOUNTER — Other Ambulatory Visit: Payer: Self-pay

## 2023-05-12 ENCOUNTER — Other Ambulatory Visit: Payer: Self-pay | Admitting: Pharmacy Technician

## 2023-05-12 MED ORDER — OLAPARIB 100 MG PO TABS
100.0000 mg | ORAL_TABLET | Freq: Two times a day (BID) | ORAL | 11 refills | Status: DC
  Filled 2023-05-12: qty 60, 30d supply, fill #0
  Filled 2023-06-27: qty 60, 30d supply, fill #1
  Filled 2023-08-01: qty 60, 30d supply, fill #2
  Filled 2023-09-06: qty 60, 30d supply, fill #3
  Filled 2023-10-24: qty 60, 30d supply, fill #4
  Filled 2023-11-22: qty 60, 30d supply, fill #5
  Filled 2023-12-16: qty 60, 30d supply, fill #6
  Filled 2024-01-17: qty 60, 30d supply, fill #7
  Filled 2024-02-07: qty 60, 30d supply, fill #8
  Filled 2024-03-14: qty 60, 30d supply, fill #9
  Filled 2024-04-12: qty 60, 30d supply, fill #10
  Filled 2024-05-08: qty 60, 30d supply, fill #11

## 2023-05-12 NOTE — Progress Notes (Signed)
 Specialty Pharmacy Initial Fill Coordination Note  April Weber is a 78 y.o. female contacted today regarding refills of specialty medication(s) Olaparib  (LYNPARZA ) .  Patient requested Delivery  on 06/09/23  to verified address [address]   [place] [id number]-   Medication will be filled on 06/07/23.   Patient is aware of $0 copayment.

## 2023-05-12 NOTE — Telephone Encounter (Signed)
 Oral Oncology Patient Advocate Encounter  Prior Authorization for Lynparza  has been approved.    PA# [id number] Effective dates: 05/11/23 through 05/09/24  Patients co-pay is $2,000.    Estefana Moellers, CPhT-Adv Oncology Pharmacy Patient Advocate [facility] Direct Number: [phone]  Fax: [phone]

## 2023-05-12 NOTE — Telephone Encounter (Signed)
 Oral Oncology Patient Advocate Encounter  Was successful in securing patient a $3,500 grant from [facility] to provide copayment coverage for Lynparza .  This will keep the out of pocket expense at $0.     [employer] ID: [id number]  I have spoken with the patient.   The billing information is as follows and has been shared with [facility].    RxBin: [id number] PCN: [id number] Member ID: [id number] Group ID: [id number] Dates of Eligibility: 04/12/23 through 04/10/24  Fund:  Ovarian  Estefana Moellers, CPhT-Adv Oncology Pharmacy Patient Advocate [facility] Direct Number: [phone]  Fax: [phone]

## 2023-05-12 NOTE — Telephone Encounter (Signed)
 Oral Oncology Patient Advocate Encounter  Patient approved for grant from [facility]. Application cancelled.  Estefana Moellers, CPhT-Adv Oncology Pharmacy Patient Advocate [facility] Direct Number: [phone]  Fax: [phone]

## 2023-05-12 NOTE — Telephone Encounter (Signed)
 Oral Oncology Patient Advocate Encounter   Received notification that prior authorization for [facility]  is required.   PA submitted on 05/12/23 Key [id number] Status is pending     Estefana Moellers, CPhT-Adv Oncology Pharmacy Patient Advocate [facility] Direct Number: [phone]  Fax: [phone]

## 2023-05-13 NOTE — Progress Notes (Signed)
 Patient transferring from Patient Assistance Program to [facility]. Patient initially counseled in clinic visit note on 10/05/22.

## 2023-05-16 ENCOUNTER — Inpatient Hospital Stay: Payer: Medicare Other | Admitting: Hematology and Oncology

## 2023-05-16 ENCOUNTER — Inpatient Hospital Stay: Payer: Medicare Other

## 2023-05-17 ENCOUNTER — Other Ambulatory Visit: Payer: Self-pay

## 2023-05-17 DIAGNOSIS — S92355A Nondisplaced fracture of fifth metatarsal bone, left foot, initial encounter for closed fracture: Secondary | ICD-10-CM | POA: Diagnosis not present

## 2023-05-19 ENCOUNTER — Inpatient Hospital Stay: Payer: Medicare Other | Attending: Gynecologic Oncology

## 2023-05-19 ENCOUNTER — Encounter: Payer: Self-pay | Admitting: Hematology and Oncology

## 2023-05-19 ENCOUNTER — Inpatient Hospital Stay (HOSPITAL_BASED_OUTPATIENT_CLINIC_OR_DEPARTMENT_OTHER): Payer: Medicare Other | Admitting: Hematology and Oncology

## 2023-05-19 VITALS — BP 142/70 | HR 58 | Temp 97.6°F | Ht 66.0 in | Wt 146.4 lb

## 2023-05-19 DIAGNOSIS — K5730 Diverticulosis of large intestine without perforation or abscess without bleeding: Secondary | ICD-10-CM | POA: Diagnosis not present

## 2023-05-19 DIAGNOSIS — C561 Malignant neoplasm of right ovary: Secondary | ICD-10-CM | POA: Insufficient documentation

## 2023-05-19 DIAGNOSIS — Z79899 Other long term (current) drug therapy: Secondary | ICD-10-CM | POA: Diagnosis not present

## 2023-05-19 DIAGNOSIS — N1831 Chronic kidney disease, stage 3a: Secondary | ICD-10-CM | POA: Insufficient documentation

## 2023-05-19 DIAGNOSIS — Z9221 Personal history of antineoplastic chemotherapy: Secondary | ICD-10-CM | POA: Insufficient documentation

## 2023-05-19 DIAGNOSIS — Z9049 Acquired absence of other specified parts of digestive tract: Secondary | ICD-10-CM | POA: Insufficient documentation

## 2023-05-19 DIAGNOSIS — N321 Vesicointestinal fistula: Secondary | ICD-10-CM | POA: Diagnosis not present

## 2023-05-19 LAB — CBC WITH DIFFERENTIAL/PLATELET
Abs Immature Granulocytes: 0.03 10*3/uL (ref 0.00–0.07)
Basophils Absolute: 0.1 10*3/uL (ref 0.0–0.1)
Basophils Relative: 1 %
Eosinophils Absolute: 0.1 10*3/uL (ref 0.0–0.5)
Eosinophils Relative: 2 %
HCT: 35.9 % — ABNORMAL LOW (ref 36.0–46.0)
Hemoglobin: 12.6 g/dL (ref 12.0–15.0)
Immature Granulocytes: 1 %
Lymphocytes Relative: 12 %
Lymphs Abs: 0.8 10*3/uL (ref 0.7–4.0)
MCH: 34.6 pg — ABNORMAL HIGH (ref 26.0–34.0)
MCHC: 35.1 g/dL (ref 30.0–36.0)
MCV: 98.6 fL (ref 80.0–100.0)
Monocytes Absolute: 0.5 10*3/uL (ref 0.1–1.0)
Monocytes Relative: 8 %
Neutro Abs: 5.1 10*3/uL (ref 1.7–7.7)
Neutrophils Relative %: 76 %
Platelets: 265 10*3/uL (ref 150–400)
RBC: 3.64 MIL/uL — ABNORMAL LOW (ref 3.87–5.11)
RDW: 13.2 % (ref 11.5–15.5)
WBC: 6.6 10*3/uL (ref 4.0–10.5)
nRBC: 0 % (ref 0.0–0.2)

## 2023-05-19 LAB — COMPREHENSIVE METABOLIC PANEL
ALT: 12 U/L (ref 0–44)
AST: 18 U/L (ref 15–41)
Albumin: 4.4 g/dL (ref 3.5–5.0)
Alkaline Phosphatase: 68 U/L (ref 38–126)
Anion gap: 6 (ref 5–15)
BUN: 24 mg/dL — ABNORMAL HIGH (ref 8–23)
CO2: 28 mmol/L (ref 22–32)
Calcium: 9.7 mg/dL (ref 8.9–10.3)
Chloride: 103 mmol/L (ref 98–111)
Creatinine, Ser: 1.51 mg/dL — ABNORMAL HIGH (ref 0.44–1.00)
GFR, Estimated: 35 mL/min — ABNORMAL LOW (ref >60)
Glucose, Bld: 61 mg/dL — ABNORMAL LOW (ref 70–99)
Potassium: 3.6 mmol/L (ref 3.5–5.1)
Sodium: 137 mmol/L (ref 135–145)
Total Bilirubin: 0.7 mg/dL (ref 0.0–1.2)
Total Protein: 7 g/dL (ref 6.5–8.1)

## 2023-05-19 NOTE — Assessment & Plan Note (Signed)
 The area of skin nodule that was reported is not palpable today on exam but will be monitored closely We discussed tumor marker monitoring and future follow-up I will see her back in 2 months with repeat labs and CT imaging

## 2023-05-19 NOTE — Progress Notes (Signed)
 [facility] OFFICE PROGRESS NOTE  Patient Care Team: Jolinda Norene HERO, DO as PCP - General (Family Medicine) Rox Charleston, MD as Attending Physician (Obstetrics and Gynecology) Patty Anes, MD as Consulting Physician (Family Medicine) Vicci Mcardle, OD (Optometry) Ramonita Suzen CROME, RN as [facility] Care Management Care, [facility] Services) Lonn Hicks, MD as Consulting Physician (Hematology and Oncology) [name], Herlene Righter, MD as Consulting Physician (General Surgery) Ivonne Harlene RAMAN, RD as Dietitian (Dietician)  ASSESSMENT & PLAN:  Right ovarian epithelial cancer [facility]) The area of skin nodule that was reported is not palpable today on exam but will be monitored closely We discussed tumor marker monitoring and future follow-up I will see her back in 2 months with repeat labs and CT imaging   Stage 3a chronic kidney disease (HCC) She is prone to get dehydrated I recommend frequent oral fluid intake Overall, her renal function is stable  Orders Placed This Encounter  Procedures   CT ABDOMEN PELVIS WO CONTRAST    Standing Status:   Future    Expected Date:   07/14/2023    Expiration Date:   05/18/2024    Scheduling Instructions:     No IV contrast due to solitary kidney    Preferred imaging location?:   [facility]    If indicated for the ordered procedure, I authorize the administration of oral contrast media per Radiology protocol:   No    Reason for no oral contrast::   no need oral contrast    All questions were answered. The patient knows to call the clinic with any problems, questions or concerns. The total time spent in the appointment was 30 minutes encounter with patients including review of chart and various tests results, discussions about plan of care and coordination of care plan   Hicks Lonn, MD 05/19/2023 10:10 AM  INTERVAL HISTORY: Please see below for problem oriented charting. she  returns for surveillance follow-up on olaparib  She injured her left foot recently due to accidental injury but overall is doing better She denies recent infection No changes in bowel habits.  No changes or palpable skin nodules on her abdominal wall  REVIEW OF SYSTEMS:   Constitutional: Denies fevers, chills or abnormal weight loss Eyes: Denies blurriness of vision Ears, nose, mouth, throat, and face: Denies mucositis or sore throat Respiratory: Denies cough, dyspnea or wheezes Cardiovascular: Denies palpitation, chest discomfort or lower extremity swelling Gastrointestinal:  Denies nausea, heartburn or change in bowel habits Skin: Denies abnormal skin rashes Lymphatics: Denies new lymphadenopathy or easy bruising Neurological:Denies numbness, tingling or new weaknesses Behavioral/Psych: Mood is stable, no new changes  All other systems were reviewed with the patient and are negative.  I have reviewed the past medical history, past surgical history, social history and family history with the patient and they are unchanged from previous note.  ALLERGIES:  is allergic to paclitaxel , emend [fosaprepitant  dimeglumine], codeine, nsaids, penicillins, vibra-tab [doxycycline], iodine , and latex.  MEDICATIONS:  Current Outpatient Medications  Medication Sig Dispense Refill   acetaminophen  (TYLENOL ) 500 MG tablet Take 2 tablets (1,000 mg total) by mouth every 6 (six) hours as needed for mild pain. 30 tablet 0   amLODipine  (NORVASC ) 5 MG tablet Take 1 tablet (5 mg total) by mouth daily. 90 tablet 3   aspirin  EC 81 MG tablet Take 81 mg by mouth at bedtime. Swallow whole.     atenolol  (TENORMIN ) 25 MG tablet Take 0.5 tablets (12.5 mg total) by  mouth 2 (two) times daily. 90 tablet 3   Calcium  Carb-Cholecalciferol  (CALCIUM  + VITAMIN D3 PO) Take 1 tablet by mouth 2 (two) times daily.     EPINEPHrine  0.3 mg/0.3 mL IJ SOAJ injection Inject 0.3 mg into the muscle as needed for anaphylaxis. 2 each 2    levothyroxine  (SYNTHROID ) 100 MCG tablet Take 1 tablet (100 mcg total) by mouth in the morning. 90 tablet 3   Multiple Vitamin (MULTIVITAMIN) tablet Take 1 tablet by mouth in the morning.     olaparib  (LYNPARZA ) 100 MG tablet Take 1 tablet (100 mg total) by mouth 2 (two) times daily. Swallow whole. May take with food to decrease nausea and vomiting. 60 tablet 11   ondansetron  (ZOFRAN ) 4 MG tablet Take 1 tablet (4 mg total) by mouth every 6 (six) hours as needed for nausea. 20 tablet 0   simvastatin  (ZOCOR ) 20 MG tablet Take 1 tablet (20 mg total) by mouth daily. 90 tablet 3   No current facility-administered medications for this visit.    SUMMARY OF ONCOLOGIC HISTORY: Oncology History Overview Note  High grade papillary serous 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   Right ovarian epithelial cancer (HCC)  03/16/2022 Imaging   1. Large, mixed solid and cystic mass in the low pelvis, difficult to clearly delineate from adjacent ascites although at least 10.2 x 10.0 cm. This is highly concerning for primary ovarian malignancy and most likely arises from the right ovary, although may involve both ovaries. This may be further evaluated by contrast enhanced MRI of the pelvis if desired. 2. Large volume ascites throughout the abdomen and pelvis, presumed malignant. Stranding and nodularity of the omentum and peritoneum, particularly in the ventral abdomen and left upper quadrant, highly suspicious for peritoneal metastatic disease. 3. Cirrhotic morphology of the liver, which may contribute to ascites. 4. Severely atrophic left kidney, consistent with remote prior infectious, obstructive, or ischemic insult. 5. Coronary artery disease.   Aortic Atherosclerosis (ICD10-I70.0).   03/17/2022 Imaging   1. There is a large solid and cystic mass within the right adnexa which measures up to 9.5 cm in maximum dimension. Findings are concerning for a primary ovarian neoplasm. 2. Large volume of  ascites within the abdomen and pelvis. Given the presence of peritoneal nodules on the CT from the prior day findings are concerning for malignant ascites.   03/19/2022 Procedure   CT-guided biopsy of omental mass.    03/19/2022 Procedure   Successful ultrasound-guided paracentesis yielding 4.2 liters of peritoneal fluid.   03/19/2022 Procedure   Successful placement of a left internal jugular approach power injectable Port-A-Cath. The catheter is ready for immediate use.   03/30/2022 Initial Diagnosis   Ovarian cancer (HCC)   03/30/2022 Pathology Results   A. RIGHT OVARY AND FALLOPIAN TUBE, SALPINGO OOPHORECTOMY: Poorly differentiated carcinoma immunohistochemically most consistent with a high grade serous carcinoma Tumor measures 10.7 x 10.6 x 4.6 cm Tumor involves ovarian and mesosalpinx serosal surfaces and right fallopian tube pT2a pN n/a  Note: The tumor consists of sheets and cords of high-grade nuclei with areas of necrosis and adjacent prominent stroma suggesting papillary cores the overall morphology is slightly unusual and not classic; therefore, a battery of immunohistochemical stains were performed to confirm the diagnosis and exclude a high-grade germ cell tumor.  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 Additionally there is nonspecific focal PLAP positivity; CD117 positivity and  D2-40 positivity of uncertain clinical significance.  The tumor is negative for AFP, hCG, CD30, CD31, CK20, glypican-3, and OCT 3/4.  Dr. Belvie has peer reviewed the case and agrees with diagnosis. Dr. Reed has also initiated the workup of the case and agrees with the interpretation.  B. OMENTUM, RESECTION: Benign omentum Negative for tumor  C. RIGHT PELVIC SIDEWALL, BIOPSY: Benign mesothelium, fibrovascular stroma and adipose Negative for tumor  D. ANTERIOR CUL DE SAC,  BIOPSY: Benign mesothelium Negative for tumor  E. LEFT PELVIC SIDEWALL, BIOPSY: Benign mesothelium and adipose Negative for tumor   ONCOLOGY TABLE:  OVARY or FALLOPIAN TUBE or PRIMARY PERITONEUM: Resection  Procedure: Right salpingo-oophorectomy Specimen Integrity: Grossly focally disrupted Tumor Site: Right ovary Tumor Size: 10.7 x 10.6 x 4.6 cm Histologic Type: Poorly differentiated carcinoma most consistent with a high-grade serous carcinoma Histologic Grade: High-grade Ovarian Surface Involvement: Surface involvement Fallopian Tube Surface Involvement: Mesosalpinx serosal surface involvement and right fallopian tube involvement Implants (required for advanced stage serous/seromucinous borderline tumors only): Not identified Other Tissue/ Organ Involvement: N/A Largest Extrapelvic Peritoneal Focus: N/A Peritoneal/Ascitic Fluid Involvement: N/A Chemotherapy Response Score (CRS): N/A, no known presurgical therapy Regional Lymph Nodes: N/A, no lymph nodes submitted Distant Metastasis:      Distant Site(s) Involved: N/A Pathologic Stage Classification (pTNM, AJCC 8th Edition): pT2a, pN N/A Ancillary Studies: Can be performed on physician request Representative Tumor Block: A8 Comment(s): [None]    03/30/2022 Surgery   Preop Diagnosis: Pelvic mass, malignant ascites   Postoperative Diagnosis: at least stage IIB ovarian cancer, suspected high grade serous by frozen section   Surgery: Diagnostic laparoscopy, right salpingo-oophorectomy, peritoneal biopsies, infra-colic omentectomy    Surgeon:  Viktoria Bear MD    Operative findings: On exam under anesthesia, somewhat mobile mass appreciated in the cul-de-sac to the right.  On agnostic laparoscopy, normal upper abdominal findings other than moderate volume ascites.  Normal-appearing peritoneum, omentum, small bowel.  Right adnexa enlarged with a mass measuring approximately 8-10 cm.  No carcinomatosis appreciated.  Given this,  decision made to convert to an open procedure for tumor debulking.  Approximately 5 L of yellow-tinged ascites was removed upon intra-abdominal entry.  Normal upper abdominal survey including smooth diaphragm and liver, stomach, and omentum.  The small bowel was run from the cecum to the ligament of Treitz with no abnormalities or carcinomatosis noted.  Right ovary replaced by an approximately 8 cm smooth mass, dilated and enlarged fallopian tube.  The mass itself was adherent to the right pelvic sidewall, sigmoid colon, and densely adherent to the anterior cul-de-sac/bladder peritoneum.  Frozen section consistent with invasive carcinoma, likely high-grade.  Left tube and ovary were surgically absent.  Sigmoid colon with adhesions to the left pelvic sidewall, left aspect of the bladder and the left vaginal cuff.  Somewhat redundant sigmoid which looped over on itself secondary to these adhesions.  Numerous diverticula noted of the sigmoid colon as well as the transverse colon.  The end of surgery, an R0 resection was accomplished.     04/13/2022 Cancer Staging   Staging form: Ovary, Fallopian Tube, and Primary Peritoneal Carcinoma, AJCC 8th Edition - Pathologic stage from 04/13/2022: Stage II (pT2, pN0, cM0) - Signed by Lonn Hicks, MD on 04/13/2022 Stage prefix: Initial diagnosis   04/26/2022 - 08/10/2022 Chemotherapy   Patient is on Treatment Plan : OVARIAN Carboplatin  (AUC 6) + Paclitaxel  (175) q21d X 6 Cycles     05/17/2022 Tumor Marker   Patient's tumor was tested for the following markers:  CA-125. Results of the tumor marker test revealed 12.7.   06/06/2022 Genetic Testing   Germline: Negative [facility] Multi-Cancer +RNA Panel.  Report date is 06/06/2022.   The Multi-Cancer + RNA Panel offered by [facility] includes sequencing and/or deletion/duplication analysis of the following 70 genes:  AIP*, ALK, APC*, ATM*, AXIN2*, BAP1*, BARD1*, BLM*, BMPR1A*, BRCA1*, BRCA2*, BRIP1*, CDC73*, CDH1*, CDK4, CDKN1B*,  CDKN2A, CHEK2*, CTNNA1*, DICER1*, EPCAM (del/dup only), EGFR, FH*, FLCN*, GREM1 (promoter dup only), HOXB13, KIT, LZTR1, MAX*, MBD4, MEN1*, MET, MITF, MLH1*, MSH2*, MSH3*, MSH6*, MUTYH*, NF1*, NF2*, NTHL1*, PALB2*, PDGFRA, PMS2*, POLD1*, POLE*, POT1*, PRKAR1A*, PTCH1*, PTEN*, RAD51C*, RAD51D*, RB1*, RET, SDHA* (sequencing only), SDHAF2*, SDHB*, SDHC*, SDHD*, SMAD4*, SMARCA4*, SMARCB1*, SMARCE1*, STK11*, SUFU*, TMEM127*, TP53*, TSC1*, TSC2*, VHL*. RNA analysis is performed for * genes.  Somatic: Positive genomic instability score (GIS) of 62 through [facility].  No pathogenic variants detected in BRCA1/2.  Report date is June 10, 2022.   [facility] [employer] CDx includes sequencing and large rearrangement analysis of BRCA1/2 and genomic instability status through loss of heterozygosity (LOH), telomeric allelic imbalance (TAI) and large-scale state transitions (LST).     06/08/2022 Tumor Marker   Patient's tumor was tested for the following markers: Ca-125. Results of the tumor marker test revealed 16.5.   06/13/2022 - 06/17/2022 Hospital Admission   The patient was admitted to the hospital and found to have diarrhea, diverticulitis and pancytopenia   06/13/2022 Imaging   Colonic diverticulosis. Inflammatory stranding around the mid sigmoid colon compatible with active diverticulitis.   Interval resection of the previously seen lower pelvic solid and cystic mass and resolution of ascites.   Aortic atherosclerosis.   Severely atrophic left kidney, stable since prior study.   06/30/2022 Tumor Marker   Patient's tumor was tested for the following markers: CA-125. Results of the tumor marker test revealed 15.   07/21/2022 Tumor Marker   Patient's tumor was tested for the following markers: CA-125. Results of the tumor marker test revealed 11.4.   08/12/2022 Tumor Marker   Patient's tumor was tested for the following markers: CA-125. Results of the tumor marker test revealed 9.7.   08/14/2022  Imaging   1. Wall thickening of the sigmoid colon with adjacent inflammatory change, overall improved when compared with prior exam. New focal collection of gas involving the wall of the sigmoid colon which extends inferiorly to the area of the left vaginal cuff with contrast material seen within the vaginal cuff. 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 2. Wall thickening of the left lateral wall of the urinary bladder with air seen within the urinary bladder, findings raise concern for additional area of fistulization to the bladder. CT cystogram could be performed for better evaluation. 3. New pelviectasis and mild dilation of the left ureter, likely secondary to inflammatory change at the area of the left ureterovesicular junction. Chronic atrophy of the left kidney. 4. Increased wall thickening of a large colonic diverticulum of the proximal transverse colon, likely due to diverticulitis. 5. Aortic Atherosclerosis (ICD10-I70.0).     08/23/2022 Imaging   1. Increased large volume gas within the bladder. This raises concern for colovesical fistula. Consider CT cystogram for further evaluation. 2. Similar to slightly decreased inflammatory changes surrounding the sigmoid colon. The previous collection of gas involving the wall of the sigmoid colon has decreased. There is a small amount of residual gas in the area of the left vaginal cuff. There is again a soft tissue tract extending from the vaginal cuff to the sigmoid colon,  though no gas is seen within this tract. 3. Decreased size and wall thickening about the large diverticulum in the proximal transverse colon. 4. Slight decrease in pelviectasis and dilation of the left ureter.   Aortic Atherosclerosis (ICD10-I70.0).     08/27/2022 Imaging   1. Continued sigmoid wall thickening and inflammatory reaction with diverticulosis with increased inflammation and layering reactive pelvic fluid since April 15. Further evaluation to  exclude underlying colonic lesion is recommended when clinically feasible. 2. There is now a demonstrable fistulous tract from the sigmoid colon into the left dorsal bladder wall, with air and fluid in the bladder and patchy enteric contrast now settling in the dependent bladder. 3. The known left-sided colovaginal fistula is not well seen today but there is contrast in the vagina consistent with continued tract patency. 4. There is now moderate bilateral hydroureteronephrosis into the pelvis where the ureters are difficult to follow due to the pre-existing inflammatory process, with ureteral tethering by inflammatory process most likely causing the obstructive uropathy. No stone disease is seen. 5. Severe chronic atrophy of the left kidney. 6. Aortic and coronary artery atherosclerosis. 7. Anemia. 8. Prominent common bile duct at 12 mm, unchanged. 9. Constipation. 10. Osteopenia, scoliosis, and advanced degenerative change of the lumbar spine with acquired spinal stenosis L4-5.   09/01/2022 Surgery   Preoperative diagnosis: colovesical fistula   Postoperative diagnosis: same    Procedure: laparoscopic converted to open sigmoid resection with anastomosis, diverting loop ileostomy, take down of colovesical fistula   Surgeon: Herlene Bureau, M.D.   Asst: [place] Mais, [facility]   Anesthesia: GETA   Indications for procedure: April Weber is a 78 y.o. year old female with symptoms of recurrent UTIs and abdominal pain.   Description of procedure: The patient was brought into the operative suite. Anesthesia was administered with General endotracheal anesthesia. WHO checklist was applied. The patient was then placed in lithotomy position. The area was prepped and draped in the usual sterile fashion.   Dr. Renda performed a cysto and plans ureteral stents prior to surgery.   Next, a left subcostal incision was made. A 5mm trocar was used to gain access to the peritoneal cavity by optical  entry technique. Pneumoperitoneum was applied with a high flow and low pressure. The laparoscope was reinserted to confirm position.   3 additional trocars were placed 1 5 mm trocar in the right upper abdomen, 1 5 mm trocar in the infraumbilical space, and 1 12 mm trocar in the right lower quadrant.   There were no adhesions to the abdominal wall. The sigmoid colon was thickened and chronically inflamed. There was one loop of small intestine adhesed to the sigmoid colon, this was sharply dissected free. The White line of Toldt was incised. Blunt dissection was used to free up the colon from the bladder and abdominal wall. This was very difficult. During this dissection Dr. Charlie came in to look at the abdomen and determined no evidence of cancer was present. After about 30 minutes of laparoscopic dissection, I did not think I could safely complete dissection laparoscopically and so a lower midline incision was made.   Next, palpation identified the ureters which were well away from the dissection. Blunt dissection was able to free the colon completely from the bladder and side wall. Next, a blue load contour stapler was used to divide the distal sigmoid colon. The mesentery was taken with ligasure device. The left colon was fully mobilized a portion of colon proximal to the inflammatory  changes was purse stringed and divided distally. A 29 mm anvil was placed into the colon and purse string tied down.    Next, Dr. Renda came in to repair the bladder. See his dictation for more information.   The rectum was mobilized more by freeing it from the side walls. At this point I went below to palpate the rectum, there was some difficulty to get the dilator to reach the staple line. There was an attempt to pass the stapler but this led to a tear in the distal rectum. Dr. Sheldon came in at this time to assist and a further margin of sigmoid/rectum was taken with the contour stapler. This allowed the stapler to come  to the end and anastomosis was performed. Leak test showed a small leak distal to the staple line that was repaired with interrupted 3-0 vicryl. Due to this leak and thin tissue at the area, decision was made to divert with loop ileostomy.   The 12 mm trocar site was enlarged and a portion of distal ileum brought through the hole. A 19 fr blake drain was brought through RUQ incision and tip placed into the pelvis. The abdomen was irrigated. Hemostasis was intact. The peritoneum was closed with 2-0 vicryl. The fascia was closed with 0 PDS. The ileostomy was matured with 3-0 and 2-3 cm of Brooking. The skin was closed with staples. Ileostomy bag was placed. All counts were correct.   Findings: colovesical fistula, dense inflammation of the sigmoid colon   09/01/2022 Surgery   Preoperative diagnosis: 1.  Colovesical fistula 2.  Atrophic left kidney 3.  Right hydronephrosis   Postoperative diagnosis: 1.  Colovesical fistula 2.  Atrophic left kidney 3.  Right hydronephrosis   Procedures: 1.  Cystoscopy 2.  Bilateral retrograde pyelography with interpretation 3.  Bilateral ureteral stent placement (right -6 x 24, left -6 x 26) 4.  Repair of bladder fistula   10/01/2022 Tumor Marker   Patient's tumor was tested for the following markers: CA-125. Results of the tumor marker test revealed 14.   10/11/2022 -  Chemotherapy   She started taking olaparib    10/25/2022 Tumor Marker   Patient's tumor was tested for the following markers: CA-125. Results of the tumor marker test revealed 10.6.   11/15/2022 Procedure   Successful removal of implanted Port-A-Cath.    02/14/2023 Tumor Marker   Patient's tumor was tested for the following markers: CA-125. Results of the tumor marker test revealed 9.5.   03/14/2023 Imaging   CT ABDOMEN PELVIS WO CONTRAST  Result Date: 03/18/2023 CLINICAL DATA:  Ovarian cancer.  Restaging.  * Tracking Code: BO * EXAM: CT ABDOMEN AND PELVIS WITHOUT CONTRAST TECHNIQUE:  Multidetector CT imaging of the abdomen and pelvis was performed following the standard protocol without IV contrast. RADIATION DOSE REDUCTION: This exam was performed according to the departmental dose-optimization program which includes automated exposure control, adjustment of the mA and/or kV according to patient size and/or use of iterative reconstruction technique. COMPARISON:  08/27/2022 FINDINGS: Lower chest: Unremarkable. Hepatobiliary: Stable tiny hypodensity in the central liver. 6 mm hypodensity subcapsular right liver on 28/2 is stable. There is no evidence for gallstones, gallbladder wall thickening, or pericholecystic fluid. No intrahepatic or extrahepatic biliary dilation. Pancreas: No focal mass lesion. No dilatation of the main duct. No intraparenchymal cyst. No peripancreatic edema. Spleen: No splenomegaly. No suspicious focal mass lesion. Adrenals/Urinary Tract: No adrenal nodule or mass. Left kidney markedly atrophic with stable small cyst. No followup imaging is recommended.  No hydronephrosis in the right kidney. Tiny hypodensity upper pole right kidney is too small to characterize but likely benign. No evidence for hydroureter. The urinary bladder appears normal for the degree of distention. Stomach/Bowel: Stomach is unremarkable. No gastric wall thickening. No evidence of outlet obstruction. Duodenum is normally positioned as is the ligament of Treitz. Large duodenal diverticulum again noted. No small bowel wall thickening. No small bowel dilatation. Diverticuli are seen scattered along the entire length of the colon without CT findings of diverticulitis. Interval left hemicolectomy with rectosigmoid anastomosis. Vascular/Lymphatic: There is moderate atherosclerotic calcification of the abdominal aorta without aneurysm. There is no gastrohepatic or hepatoduodenal ligament lymphadenopathy. No retroperitoneal or mesenteric lymphadenopathy. No pelvic sidewall lymphadenopathy. Reproductive:  Hysterectomy.  There is no adnexal mass. Other: No intraperitoneal free fluid. Musculoskeletal: No worrisome lytic or sclerotic osseous abnormality. 11 mm subcutaneous nodule anterior right abdominal wall on 46/2 is new in the interval. IMPRESSION: 1. Interval left hemicolectomy with rectosigmoid anastomosis. 2. 11 mm subcutaneous nodule anterior right abdominal wall is new in the interval. This is nonspecific and may be related to recent injection. Close follow-up recommended. 3. No findings of recurrent or metastatic disease on the current exam. 4.  Aortic Atherosclerosis (ICD10-I70.0). Electronically Signed   By: Camellia Candle M.D.   On: 03/18/2023 10:30   MM 3D DIAGNOSTIC MAMMOGRAM UNILATERAL LEFT BREAST  Result Date: 03/15/2023 CLINICAL DATA:  Interval follow-up of a likely benign focal asymmetry in the LEFT breast. Personal history of malignant RIGHT mastectomy and excisional biopsy of a complex sclerosing lesion from the LEFT breast in May, 2023. EXAM: DIGITAL DIAGNOSTIC UNILATERAL LEFT MAMMOGRAM WITH TOMOSYNTHESIS AND CAD TECHNIQUE: Left digital diagnostic mammography and breast tomosynthesis was performed. The images were evaluated with computer-aided detection. COMPARISON:  Previous exams, most recently May and June, 2023. ACR Breast Density Category b: There are scattered areas of fibroglandular density. FINDINGS: Full field CC and MLO views were obtained. No findings suspicious for malignancy. The focal asymmetry in the outer breast questioned previously represents normal fibroglandular tissue. IMPRESSION: No mammographic evidence of malignancy involving the LEFT breast. RECOMMENDATION: Screening LEFT mammogram in one year.(Code:SM-B-01Y) I have discussed the findings and recommendations with the patient. If applicable, a reminder letter will be sent to the patient regarding the next appointment. BI-RADS CATEGORY  1: Negative. Electronically Signed   By: Debby Satterfield M.D.   On: 03/15/2023 11:24       03/16/2023 Tumor Marker   Patient's tumor was tested for the following markers: CA-125. Results of the tumor marker test revealed 7.8.     PHYSICAL EXAMINATION: ECOG PERFORMANCE STATUS: 0 - Asymptomatic  Vitals:   05/19/23 0924  BP: (!) 142/70  Pulse: (!) 58  Temp: 97.6 F (36.4 C)  SpO2: 100%   Filed Weights   05/19/23 0924  Weight: 146 lb 6.4 oz (66.4 kg)    GENERAL:alert, no distress and comfortable SKIN: skin color, texture, turgor are normal, no rashes or significant lesions EYES: normal, Conjunctiva are pink and non-injected, sclera clear OROPHARYNX:no exudate, no erythema and lips, buccal mucosa, and tongue normal  NECK: supple, thyroid  normal size, non-tender, without nodularity LYMPH:  no palpable lymphadenopathy in the cervical, axillary or inguinal LUNGS: clear to auscultation and percussion with normal breathing effort HEART: regular rate & rhythm and no murmurs and no lower extremity edema ABDOMEN:abdomen soft, non-tender and normal bowel sounds.  She does not have palpable subcutaneous nodule Musculoskeletal:no cyanosis of digits and no clubbing  NEURO: alert & oriented  x 3 with fluent speech, no focal motor/sensory deficits  LABORATORY DATA:  I have reviewed the data as listed    Component Value Date/Time   NA 137 05/19/2023 0858   NA 135 11/25/2022 0935   K 3.6 05/19/2023 0858   CL 103 05/19/2023 0858   CO2 28 05/19/2023 0858   GLUCOSE 61 (L) 05/19/2023 0858   BUN 24 (H) 05/19/2023 0858   BUN 21 11/25/2022 0935   CREATININE 1.51 (H) 05/19/2023 0858   CREATININE 0.93 08/10/2022 0857   CREATININE 0.99 02/05/2013 0757   CALCIUM  9.7 05/19/2023 0858   PROT 7.0 05/19/2023 0858   PROT 6.5 11/25/2022 0935   ALBUMIN  4.4 05/19/2023 0858   ALBUMIN  4.2 11/25/2022 0935   AST 18 05/19/2023 0858   AST 12 (L) 08/10/2022 0857   ALT 12 05/19/2023 0858   ALT 11 08/10/2022 0857   ALKPHOS 68 05/19/2023 0858   BILITOT 0.7 05/19/2023 0858   BILITOT 0.5  11/25/2022 0935   BILITOT 0.3 08/10/2022 0857   GFRNONAA 35 (L) 05/19/2023 0858   GFRNONAA >60 08/10/2022 0857   GFRNONAA 59 (L) 02/05/2013 0757   GFRAA 61 05/20/2020 0850   GFRAA 68 02/05/2013 0757    No results found for: SPEP, UPEP  Lab Results  Component Value Date   WBC 6.6 05/19/2023   NEUTROABS 5.1 05/19/2023   HGB 12.6 05/19/2023   HCT 35.9 (L) 05/19/2023   MCV 98.6 05/19/2023   PLT 265 05/19/2023      Chemistry      Component Value Date/Time   NA 137 05/19/2023 0858   NA 135 11/25/2022 0935   K 3.6 05/19/2023 0858   CL 103 05/19/2023 0858   CO2 28 05/19/2023 0858   BUN 24 (H) 05/19/2023 0858   BUN 21 11/25/2022 0935   CREATININE 1.51 (H) 05/19/2023 0858   CREATININE 0.93 08/10/2022 0857   CREATININE 0.99 02/05/2013 0757      Component Value Date/Time   CALCIUM  9.7 05/19/2023 0858   ALKPHOS 68 05/19/2023 0858   AST 18 05/19/2023 0858   AST 12 (L) 08/10/2022 0857   ALT 12 05/19/2023 0858   ALT 11 08/10/2022 0857   BILITOT 0.7 05/19/2023 0858   BILITOT 0.5 11/25/2022 0935   BILITOT 0.3 08/10/2022 0857

## 2023-05-19 NOTE — Assessment & Plan Note (Signed)
She is prone to get dehydrated I recommend frequent oral fluid intake Overall, her renal function is stable

## 2023-05-21 LAB — CA 125: Cancer Antigen (CA) 125: 9 U/mL (ref 0.0–38.1)

## 2023-05-24 ENCOUNTER — Other Ambulatory Visit (HOSPITAL_COMMUNITY): Payer: Self-pay

## 2023-06-01 ENCOUNTER — Encounter: Payer: Self-pay | Admitting: Gynecologic Oncology

## 2023-06-03 ENCOUNTER — Inpatient Hospital Stay (HOSPITAL_BASED_OUTPATIENT_CLINIC_OR_DEPARTMENT_OTHER): Payer: Medicare Other | Admitting: Gynecologic Oncology

## 2023-06-03 ENCOUNTER — Encounter: Payer: Self-pay | Admitting: Gynecologic Oncology

## 2023-06-03 VITALS — BP 122/68 | HR 68 | Temp 97.6°F | Resp 20 | Wt 148.0 lb

## 2023-06-03 DIAGNOSIS — Z9221 Personal history of antineoplastic chemotherapy: Secondary | ICD-10-CM | POA: Diagnosis not present

## 2023-06-03 DIAGNOSIS — Z9049 Acquired absence of other specified parts of digestive tract: Secondary | ICD-10-CM | POA: Diagnosis not present

## 2023-06-03 DIAGNOSIS — N1831 Chronic kidney disease, stage 3a: Secondary | ICD-10-CM | POA: Diagnosis not present

## 2023-06-03 DIAGNOSIS — C561 Malignant neoplasm of right ovary: Secondary | ICD-10-CM

## 2023-06-03 DIAGNOSIS — N321 Vesicointestinal fistula: Secondary | ICD-10-CM | POA: Diagnosis not present

## 2023-06-03 DIAGNOSIS — Z79899 Other long term (current) drug therapy: Secondary | ICD-10-CM | POA: Diagnosis not present

## 2023-06-03 DIAGNOSIS — K5730 Diverticulosis of large intestine without perforation or abscess without bleeding: Secondary | ICD-10-CM | POA: Diagnosis not present

## 2023-06-03 NOTE — Progress Notes (Signed)
Gynecologic Oncology Return Clinic Visit  06/03/23  Reason for Visit: surveillance  Treatment History: Oncology History Overview Note  High grade papillary serous 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   Right ovarian epithelial cancer (HCC)  03/16/2022 Imaging   1. Large, mixed solid and cystic mass in the low pelvis, difficult to clearly delineate from adjacent ascites although at least 10.2 x 10.0 cm. This is highly concerning for primary ovarian malignancy and most likely arises from the right ovary, although may involve both ovaries. This may be further evaluated by contrast enhanced MRI of the pelvis if desired. 2. Large volume ascites throughout the abdomen and pelvis, presumed malignant. Stranding and nodularity of the omentum and peritoneum, particularly in the ventral abdomen and left upper quadrant, highly suspicious for peritoneal metastatic disease. 3. Cirrhotic morphology of the liver, which may contribute to ascites. 4. Severely atrophic left kidney, consistent with remote prior infectious, obstructive, or ischemic insult. 5. Coronary artery disease.   Aortic Atherosclerosis (ICD10-I70.0).   03/17/2022 Imaging   1. There is a large solid and cystic mass within the right adnexa which measures up to 9.5 cm in maximum dimension. Findings are concerning for a primary ovarian neoplasm. 2. Large volume of ascites within the abdomen and pelvis. Given the presence of peritoneal nodules on the CT from the prior day findings are concerning for malignant ascites.   03/19/2022 Procedure   CT-guided biopsy of omental mass.    03/19/2022 Procedure   Successful ultrasound-guided paracentesis yielding 4.2 liters of peritoneal fluid.   03/19/2022 Procedure   Successful placement of a left internal jugular approach power injectable Port-A-Cath. The catheter is ready for immediate use.   03/30/2022 Initial Diagnosis   Ovarian cancer (HCC)   03/30/2022 Pathology Results    A. RIGHT OVARY AND FALLOPIAN TUBE, SALPINGO OOPHORECTOMY: Poorly differentiated carcinoma immunohistochemically most consistent with a high grade serous carcinoma Tumor measures 10.7 x 10.6 x 4.6 cm Tumor involves ovarian and mesosalpinx serosal surfaces and right fallopian tube pT2a pN n/a  Note: The tumor consists of sheets and cords of high-grade nuclei with areas of necrosis and adjacent prominent stroma suggesting papillary cores the overall morphology is slightly unusual and not classic; therefore, a battery of immunohistochemical stains were performed to confirm the diagnosis and exclude a high-grade germ cell tumor.  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 Additionally there is nonspecific focal PLAP positivity; CD117 positivity and D2-40 positivity of uncertain clinical significance.  The tumor is negative for AFP, hCG, CD30, CD31, CK20, glypican-3, and OCT 3/4.  Dr. Luisa Hart has peer reviewed the case and agrees with diagnosis. Dr. Venetia Night has also initiated the workup of the case and agrees with the interpretation.  B. OMENTUM, RESECTION: Benign omentum Negative for tumor  C. RIGHT PELVIC SIDEWALL, BIOPSY: Benign mesothelium, fibrovascular stroma and adipose Negative for tumor  D. ANTERIOR CUL DE SAC, BIOPSY: Benign mesothelium Negative for tumor  E. LEFT PELVIC SIDEWALL, BIOPSY: Benign mesothelium and adipose Negative for tumor   ONCOLOGY TABLE:  OVARY or FALLOPIAN TUBE or PRIMARY PERITONEUM: Resection  Procedure: Right salpingo-oophorectomy Specimen Integrity: Grossly focally disrupted Tumor Site: Right ovary Tumor Size: 10.7 x 10.6 x 4.6 cm Histologic Type: Poorly differentiated carcinoma most consistent with a high-grade serous carcinoma Histologic Grade: High-grade Ovarian Surface Involvement: Surface involvement Fallopian Tube Surface Involvement:  Mesosalpinx serosal surface involvement and right fallopian tube involvement Implants (required  for advanced stage serous/seromucinous borderline tumors only): Not identified Other Tissue/ Organ Involvement: N/A Largest Extrapelvic Peritoneal Focus: N/A Peritoneal/Ascitic Fluid Involvement: N/A Chemotherapy Response Score (CRS): N/A, no known presurgical therapy Regional Lymph Nodes: N/A, no lymph nodes submitted Distant Metastasis:      Distant Site(s) Involved: N/A Pathologic Stage Classification (pTNM, AJCC 8th Edition): pT2a, pN N/A Ancillary Studies: Can be performed on physician request Representative Tumor Block: A8 Comment(s): [None]    03/30/2022 Surgery   Preop Diagnosis: Pelvic mass, malignant ascites   Postoperative Diagnosis: at least stage IIB ovarian cancer, suspected high grade serous by frozen section   Surgery: Diagnostic laparoscopy, right salpingo-oophorectomy, peritoneal biopsies, infra-colic omentectomy    Surgeon:  Carin Hock MD    Operative findings: On exam under anesthesia, somewhat mobile mass appreciated in the cul-de-sac to the right.  On agnostic laparoscopy, normal upper abdominal findings other than moderate volume ascites.  Normal-appearing peritoneum, omentum, small bowel.  Right adnexa enlarged with a mass measuring approximately 8-10 cm.  No carcinomatosis appreciated.  Given this, decision made to convert to an open procedure for tumor debulking.  Approximately 5 L of yellow-tinged ascites was removed upon intra-abdominal entry.  Normal upper abdominal survey including smooth diaphragm and liver, stomach, and omentum.  The small bowel was run from the cecum to the ligament of Treitz with no abnormalities or carcinomatosis noted.  Right ovary replaced by an approximately 8 cm smooth mass, dilated and enlarged fallopian tube.  The mass itself was adherent to the right pelvic sidewall, sigmoid colon, and densely adherent to the anterior cul-de-sac/bladder  peritoneum.  Frozen section consistent with invasive carcinoma, likely high-grade.  Left tube and ovary were surgically absent.  Sigmoid colon with adhesions to the left pelvic sidewall, left aspect of the bladder and the left vaginal cuff.  Somewhat redundant sigmoid which looped over on itself secondary to these adhesions.  Numerous diverticula noted of the sigmoid colon as well as the transverse colon.  The end of surgery, an R0 resection was accomplished.     04/13/2022 Cancer Staging   Staging form: Ovary, Fallopian Tube, and Primary Peritoneal Carcinoma, AJCC 8th Edition - Pathologic stage from 04/13/2022: Stage II (pT2, pN0, cM0) - Signed by [name], MD on 04/13/2022 Stage prefix: Initial diagnosis   04/26/2022 - 08/10/2022 Chemotherapy   Patient is on Treatment Plan : OVARIAN Carboplatin (AUC 6) + Paclitaxel (175) q21d X 6 Cycles     05/17/2022 Tumor Marker   Patient's tumor was tested for the following markers: CA-125. Results of the tumor marker test revealed 12.7.   06/06/2022 Genetic Testing   Germline: Negative [facility] Multi-Cancer +RNA Panel.  Report date is 06/06/2022.   The Multi-Cancer + RNA Panel offered by [facility] includes sequencing and/or deletion/duplication analysis of the following 70 genes:  AIP*, ALK, APC*, ATM*, AXIN2*, BAP1*, BARD1*, BLM*, BMPR1A*, BRCA1*, BRCA2*, BRIP1*, CDC73*, CDH1*, CDK4, CDKN1B*, CDKN2A, CHEK2*, CTNNA1*, DICER1*, EPCAM (del/dup only), EGFR, FH*, FLCN*, GREM1 (promoter dup only), HOXB13, KIT, LZTR1, MAX*, MBD4, MEN1*, MET, MITF, MLH1*, MSH2*, MSH3*, MSH6*, MUTYH*, NF1*, NF2*, NTHL1*, PALB2*, PDGFRA, PMS2*, POLD1*, POLE*, POT1*, PRKAR1A*, PTCH1*, PTEN*, RAD51C*, RAD51D*, RB1*, RET, SDHA* (sequencing only), SDHAF2*, SDHB*, SDHC*, SDHD*, SMAD4*, SMARCA4*, SMARCB1*, SMARCE1*, STK11*, SUFU*, TMEM127*, TP53*, TSC1*, TSC2*, VHL*. RNA analysis is performed for * genes.  Somatic: Positive genomic instability score (GIS) of 62 through [facility].  No  pathogenic variants detected in BRCA1/2.  Report date is June 10, 2022.   [facility] CDx includes sequencing and  large rearrangement analysis of BRCA1/2 and genomic instability status through loss of heterozygosity (LOH), telomeric allelic imbalance (TAI) and large-scale state transitions (LST).     06/08/2022 Tumor Marker   Patient's tumor was tested for the following markers: Ca-125. Results of the tumor marker test revealed 16.5.   06/13/2022 - 06/17/2022 Hospital Admission   The patient was admitted to the hospital and found to have diarrhea, diverticulitis and pancytopenia   06/13/2022 Imaging   Colonic diverticulosis. Inflammatory stranding around the mid sigmoid colon compatible with active diverticulitis.   Interval resection of the previously seen lower pelvic solid and cystic mass and resolution of ascites.   Aortic atherosclerosis.   Severely atrophic left kidney, stable since prior study.   06/30/2022 Tumor Marker   Patient's tumor was tested for the following markers: CA-125. Results of the tumor marker test revealed 15.   07/21/2022 Tumor Marker   Patient's tumor was tested for the following markers: CA-125. Results of the tumor marker test revealed 11.4.   08/12/2022 Tumor Marker   Patient's tumor was tested for the following markers: CA-125. Results of the tumor marker test revealed 9.7.   08/14/2022 Imaging   1. Wall thickening of the sigmoid colon with adjacent inflammatory change, overall improved when compared with prior exam. New focal collection of gas involving the wall of the sigmoid colon which extends inferiorly to the area of the left vaginal cuff with contrast material seen within the vaginal cuff. 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 2. Wall thickening of the left lateral wall of the urinary bladder with air seen within the urinary bladder, findings raise concern for additional area of fistulization to the bladder. CT cystogram could be  performed for better evaluation. 3. New pelviectasis and mild dilation of the left ureter, likely secondary to inflammatory change at the area of the left ureterovesicular junction. Chronic atrophy of the left kidney. 4. Increased wall thickening of a large colonic diverticulum of the proximal transverse colon, likely due to diverticulitis. 5. Aortic Atherosclerosis (ICD10-I70.0).     08/23/2022 Imaging   1. Increased large volume gas within the bladder. This raises concern for colovesical fistula. Consider CT cystogram for further evaluation. 2. Similar to slightly decreased inflammatory changes surrounding the sigmoid colon. The previous collection of gas involving the wall of the sigmoid colon has decreased. There is a small amount of residual gas in the area of the left vaginal cuff. There is again a soft tissue tract extending from the vaginal cuff to the sigmoid colon, though no gas is seen within this tract. 3. Decreased size and wall thickening about the large diverticulum in the proximal transverse colon. 4. Slight decrease in pelviectasis and dilation of the left ureter.   Aortic Atherosclerosis (ICD10-I70.0).     08/27/2022 Imaging   1. Continued sigmoid wall thickening and inflammatory reaction with diverticulosis with increased inflammation and layering reactive pelvic fluid since April 15. Further evaluation to exclude underlying colonic lesion is recommended when clinically feasible. 2. There is now a demonstrable fistulous tract from the sigmoid colon into the left dorsal bladder wall, with air and fluid in the bladder and patchy enteric contrast now settling in the dependent bladder. 3. The known left-sided colovaginal fistula is not well seen today but there is contrast in the vagina consistent with continued tract patency. 4. There is now moderate bilateral hydroureteronephrosis into the pelvis where the ureters are difficult to follow due to the pre-existing inflammatory  process, with ureteral tethering by inflammatory  process most likely causing the obstructive uropathy. No stone disease is seen. 5. Severe chronic atrophy of the left kidney. 6. Aortic and coronary artery atherosclerosis. 7. Anemia. 8. Prominent common bile duct at 12 mm, unchanged. 9. Constipation. 10. Osteopenia, scoliosis, and advanced degenerative change of the lumbar spine with acquired spinal stenosis L4-5.   09/01/2022 Surgery   Preoperative diagnosis: colovesical fistula   Postoperative diagnosis: same    Procedure: laparoscopic converted to open sigmoid resection with anastomosis, diverting loop ileostomy, take down of colovesical fistula   Surgeon: Feliciana Rossetti, M.D.   Asst: Carl Best, [facility]   Anesthesia: GETA   Indications for procedure: LARISHA VENCILL is a 78 y.o. year old female with symptoms of recurrent UTIs and abdominal pain.   Description of procedure: The patient was brought into the operative suite. Anesthesia was administered with General endotracheal anesthesia. WHO checklist was applied. The patient was then placed in lithotomy position. The area was prepped and draped in the usual sterile fashion.   Dr. Laverle Patter performed a cysto and plans ureteral stents prior to surgery.   Next, a left subcostal incision was made. A 5mm trocar was used to gain access to the peritoneal cavity by optical entry technique. Pneumoperitoneum was applied with a high flow and low pressure. The laparoscope was reinserted to confirm position.   3 additional trocars were placed 1 5 mm trocar in the right upper abdomen, 1 5 mm trocar in the infraumbilical space, and 1 12 mm trocar in the right lower quadrant.   There were no adhesions to the abdominal wall. The sigmoid colon was thickened and chronically inflamed. There was one loop of small intestine adhesed to the sigmoid colon, this was sharply dissected free. The White line of Toldt was incised. Blunt dissection was used to free  up the colon from the bladder and abdominal wall. This was very difficult. During this dissection Dr. Melynda Keller came in to look at the abdomen and determined no evidence of cancer was present. After about 30 minutes of laparoscopic dissection, I did not think I could safely complete dissection laparoscopically and so a lower midline incision was made.   Next, palpation identified the ureters which were well away from the dissection. Blunt dissection was able to free the colon completely from the bladder and side wall. Next, a blue load contour stapler was used to divide the distal sigmoid colon. The mesentery was taken with ligasure device. The left colon was fully mobilized a portion of colon proximal to the inflammatory changes was purse stringed and divided distally. A 29 mm anvil was placed into the colon and purse string tied down.    Next, Dr. Laverle Patter came in to repair the bladder. See his dictation for more information.   The rectum was mobilized more by freeing it from the side walls. At this point I went below to palpate the rectum, there was some difficulty to get the dilator to reach the staple line. There was an attempt to pass the stapler but this led to a tear in the distal rectum. Dr. Michaell Cowing came in at this time to assist and a further margin of sigmoid/rectum was taken with the contour stapler. This allowed the stapler to come to the end and anastomosis was performed. Leak test showed a small leak distal to the staple line that was repaired with interrupted 3-0 vicryl. Due to this leak and thin tissue at the area, decision was made to divert with loop ileostomy.   The  12 mm trocar site was enlarged and a portion of distal ileum brought through the hole. A 19 fr blake drain was brought through RUQ incision and tip placed into the pelvis. The abdomen was irrigated. Hemostasis was intact. The peritoneum was closed with 2-0 vicryl. The fascia was closed with 0 PDS. The ileostomy was matured with 3-0  and 2-3 cm of Brooking. The skin was closed with staples. Ileostomy bag was placed. All counts were correct.   Findings: colovesical fistula, dense inflammation of the sigmoid colon   09/01/2022 Surgery   Preoperative diagnosis: 1.  Colovesical fistula 2.  Atrophic left kidney 3.  Right hydronephrosis   Postoperative diagnosis: 1.  Colovesical fistula 2.  Atrophic left kidney 3.  Right hydronephrosis   Procedures: 1.  Cystoscopy 2.  Bilateral retrograde pyelography with interpretation 3.  Bilateral ureteral stent placement (right -6 x 24, left -6 x 26) 4.  Repair of bladder fistula   10/01/2022 Tumor Marker   Patient's tumor was tested for the following markers: CA-125. Results of the tumor marker test revealed 14.   10/11/2022 -  Chemotherapy   She started taking olaparib   10/25/2022 Tumor Marker   Patient's tumor was tested for the following markers: CA-125. Results of the tumor marker test revealed 10.6.   11/15/2022 Procedure   Successful removal of implanted Port-A-Cath.    02/14/2023 Tumor Marker   Patient's tumor was tested for the following markers: CA-125. Results of the tumor marker test revealed 9.5.   03/14/2023 Imaging   CT ABDOMEN PELVIS WO CONTRAST  Result Date: 03/18/2023 CLINICAL DATA:  Ovarian cancer.  Restaging.  * Tracking Code: BO * EXAM: CT ABDOMEN AND PELVIS WITHOUT CONTRAST TECHNIQUE: Multidetector CT imaging of the abdomen and pelvis was performed following the standard protocol without IV contrast. RADIATION DOSE REDUCTION: This exam was performed according to the departmental dose-optimization program which includes automated exposure control, adjustment of the mA and/or kV according to patient size and/or use of iterative reconstruction technique. COMPARISON:  08/27/2022 FINDINGS: Lower chest: Unremarkable. Hepatobiliary: Stable tiny hypodensity in the central liver. 6 mm hypodensity subcapsular right liver on 28/2 is stable. There is no evidence for  gallstones, gallbladder wall thickening, or pericholecystic fluid. No intrahepatic or extrahepatic biliary dilation. Pancreas: No focal mass lesion. No dilatation of the main duct. No intraparenchymal cyst. No peripancreatic edema. Spleen: No splenomegaly. No suspicious focal mass lesion. Adrenals/Urinary Tract: No adrenal nodule or mass. Left kidney markedly atrophic with stable small cyst. No followup imaging is recommended. No hydronephrosis in the right kidney. Tiny hypodensity upper pole right kidney is too small to characterize but likely benign. No evidence for hydroureter. The urinary bladder appears normal for the degree of distention. Stomach/Bowel: Stomach is unremarkable. No gastric wall thickening. No evidence of outlet obstruction. Duodenum is normally positioned as is the ligament of Treitz. Large duodenal diverticulum again noted. No small bowel wall thickening. No small bowel dilatation. Diverticuli are seen scattered along the entire length of the colon without CT findings of diverticulitis. Interval left hemicolectomy with rectosigmoid anastomosis. Vascular/Lymphatic: There is moderate atherosclerotic calcification of the abdominal aorta without aneurysm. There is no gastrohepatic or hepatoduodenal ligament lymphadenopathy. No retroperitoneal or mesenteric lymphadenopathy. No pelvic sidewall lymphadenopathy. Reproductive: Hysterectomy.  There is no adnexal mass. Other: No intraperitoneal free fluid. Musculoskeletal: No worrisome lytic or sclerotic osseous abnormality. 11 mm subcutaneous nodule anterior right abdominal wall on 46/2 is new in the interval. IMPRESSION: 1. Interval left hemicolectomy with rectosigmoid anastomosis. 2.  11 mm subcutaneous nodule anterior right abdominal wall is new in the interval. This is nonspecific and may be related to recent injection. Close follow-up recommended. 3. No findings of recurrent or metastatic disease on the current exam. 4.  Aortic Atherosclerosis  (ICD10-I70.0). Electronically Signed   By: Kennith Center M.D.   On: 03/18/2023 10:30   MM 3D DIAGNOSTIC MAMMOGRAM UNILATERAL LEFT BREAST  Result Date: 03/15/2023 CLINICAL DATA:  Interval follow-up of a likely benign focal asymmetry in the LEFT breast. Personal history of malignant RIGHT mastectomy and excisional biopsy of a complex sclerosing lesion from the LEFT breast in May, 2023. EXAM: DIGITAL DIAGNOSTIC UNILATERAL LEFT MAMMOGRAM WITH TOMOSYNTHESIS AND CAD TECHNIQUE: Left digital diagnostic mammography and breast tomosynthesis was performed. The images were evaluated with computer-aided detection. COMPARISON:  Previous exams, most recently May and June, 2023. ACR Breast Density Category b: There are scattered areas of fibroglandular density. FINDINGS: Full field CC and MLO views were obtained. No findings suspicious for malignancy. The focal asymmetry in the outer breast questioned previously represents normal fibroglandular tissue. IMPRESSION: No mammographic evidence of malignancy involving the LEFT breast. RECOMMENDATION: Screening LEFT mammogram in one year.(Code:[id number]) I have discussed the findings and recommendations with the patient. If applicable, a reminder letter will be sent to the patient regarding the next appointment. BI-RADS CATEGORY  1: Negative. Electronically Signed   By: Hulan Saas M.D.   On: 03/15/2023 11:24      03/16/2023 Tumor Marker   Patient's tumor was tested for the following markers: CA-125. Results of the tumor marker test revealed 7.8.   05/22/2023 Tumor Marker   Patient's tumor was tested for the following markers: CA-125. Results of the tumor marker test revealed 9.0.     Interval History: Doing well.  Denies any abdominal or pelvic pain.  Reports still some fecal incontinence although this is improving with time since her surgery.  Endorses regular bowel function.  Denies any urinary symptoms.  Recently broke her left foot, has been in a boot for about 4  weeks.  Denies any vaginal bleeding.  Past Medical/Surgical History: Past Medical History:  Diagnosis Date   Arthritis    Breast cancer (HCC) 1980   RIGHT   Chronic kidney disease    has one kidney   Diverticulitis of colon    DVT (deep venous thrombosis) (HCC)    on RIGHT leg-hx of, superficial   Dyspnea    Headache    occ migraine   Hyperlipidemia    on meds   Hypertension    on meds   Osteopenia    Ovarian cancer (HCC) 03/2022   Peripheral vascular disease (HCC)    PONV (postoperative nausea and vomiting)    Seasonal allergies    Thyroid disease    on meds    Past Surgical History:  Procedure Laterality Date   ABDOMINAL HYSTERECTOMY     APPENDECTOMY     BLADDER REPAIR  09/01/2022   Procedure: REPAIR BLADDER FISTULA WITH HYDRODISTENTION WITH METHYLINE BLUE;  Surgeon: Heloise Purpura, MD;  Location: [facility];  Service: Urology;;   BREAST BIOPSY     BREAST LUMPECTOMY WITH RADIOACTIVE SEED LOCALIZATION Left 12/01/2021   Procedure: LEFT BREAST RADIOACTIVE SEED GUIDED LUMPECTOMY;  Surgeon: Abigail Miyamoto, MD;  Location: [facility];  Service: General;  Laterality: Left;  LMA   COLON RESECTION N/A 09/01/2022   Procedure: LAPAROSCOPIC SIGMOIDECTOMY CONVERTED TO OPEN, TAKEDOWN OF COLOVESICAL FISTULA AND LOOP COLOSTOMY;  Surgeon: Sheliah Hatch [name], MD;  Location: [facility];  Service: General;  Laterality: N/A;   COLONOSCOPY  2011   [email or web address]   CYSTOSCOPY WITH STENT PLACEMENT  09/01/2022   Procedure: CYSTOSCOPY WITH BILATERAL URETERAL STENT PLACEMENTS;  Surgeon: Heloise Purpura, MD;  Location: [facility];  Service: Urology;;   DEBULKING N/A 03/30/2022   Procedure: TUMOR DEBULKING;  Surgeon: Carver Fila, MD;  Location: [facility];  Service: Gynecology;  Laterality: N/A;   EYE SURGERY     78 years old   ILEOSTOMY CLOSURE N/A 01/11/2023   Procedure: LOOP ILEOSTOMY REVERSAL;  Surgeon: Sheliah Hatch [name], MD;  Location: [facility];  Service: General;  Laterality:  N/A;   IR IMAGING GUIDED PORT INSERTION  03/19/2022   IR REMOVAL TUN ACCESS W/ PORT W/O FL MOD SED  11/15/2022   LAPAROSCOPY N/A 03/30/2022   Procedure: LAPAROSCOPY DIAGNOSTIC;  Surgeon: Carver Fila, MD;  Location: [facility];  Service: Gynecology;  Laterality: N/A;   MASTECTOMY Right 1984   ROTATOR CUFF REPAIR     SALPINGOOPHORECTOMY Bilateral 03/30/2022   Procedure: OPEN SALPINGO OOPHORECTOMY;  Surgeon: Carver Fila, MD;  Location: [facility];  Service: Gynecology;  Laterality: Bilateral;   THYROID SURGERY Bilateral 1967   VARICOSE VEIN SURGERY     ablation   WISDOM TOOTH EXTRACTION      Family History  Problem Relation Age of Onset   Heart attack Mother    Alcohol abuse Mother    Lung cancer Mother        d. 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  Tobacco Use   Smoking status: Former    Current packs/day: 0.00    Types: Cigarettes    Quit date: 05/11/1967    Years since quitting: 56.1    Passive exposure: Past   Smokeless tobacco: Never  Vaping Use   Vaping status: Never Used  Substance and Sexual Activity   Alcohol use: No   Drug use: No   Sexual activity: Not Currently    Birth control/protection: Post-menopausal, Surgical    Comment: hyst  Other Topics Concern   Not on file  Social History Narrative   Lives alone - very close with her neighbors   Social Drivers of Corporate investment banker Strain: Low Risk  (01/25/2023)   Overall Financial Resource Strain (CARDIA)    Difficulty of Paying Living Expenses: Not hard at  all  Food Insecurity: No Food Insecurity (01/25/2023)   Hunger Vital Sign    Worried About Running Out of Food in the Last Year: Never true    Ran Out of Food in the Last Year: Never true  Transportation Needs: No Transportation Needs (01/25/2023)   PRAPARE - Administrator, Civil Service (Medical): No    Lack of Transportation (Non-Medical): No  Physical Activity: Insufficiently Active (01/25/2023)   Exercise Vital Sign    Days of Exercise per Week: 3 days  Minutes of Exercise per Session: 30 min  Stress: No Stress Concern Present (01/25/2023)   [employer] of Occupational Health - Occupational Stress Questionnaire    Feeling of Stress : Not at all  Social Connections: Unknown (01/25/2023)   Social Connection and Isolation Panel [NHANES]    Frequency of Communication with Friends and Family: More than three times a week    Frequency of Social Gatherings with Friends and Family: More than three times a week    Attends Religious Services: More than 4 times per year    Active Member of [employer] or Organizations: Yes    Attends Engineer, structural: More than 4 times per year    Marital Status: Patient declined    Current Medications:  Current Outpatient Medications:    acetaminophen (TYLENOL) 500 MG tablet, Take 2 tablets (1,000 mg total) by mouth every 6 (six) hours as needed for mild pain., Disp: 30 tablet, Rfl: 0   amLODipine (NORVASC) 5 MG tablet, Take 1 tablet (5 mg total) by mouth daily., Disp: 90 tablet, Rfl: 3   aspirin EC 81 MG tablet, Take 81 mg by mouth at bedtime. Swallow whole., Disp: , Rfl:    atenolol (TENORMIN) 25 MG tablet, Take 0.5 tablets (12.5 mg total) by mouth 2 (two) times daily., Disp: 90 tablet, Rfl: 3   Calcium Carb-Cholecalciferol (CALCIUM + VITAMIN D3 PO), Take 1 tablet by mouth 2 (two) times daily., Disp: , Rfl:    EPINEPHrine 0.3 mg/0.3 mL IJ SOAJ injection, Inject 0.3 mg into the muscle as needed for anaphylaxis., Disp: 2 each, Rfl:  2   levothyroxine (SYNTHROID) 100 MCG tablet, Take 1 tablet (100 mcg total) by mouth in the morning., Disp: 90 tablet, Rfl: 3   Multiple Vitamin (MULTIVITAMIN) tablet, Take 1 tablet by mouth in the morning., Disp: , Rfl:    olaparib (LYNPARZA) 100 MG tablet, Take 1 tablet (100 mg total) by mouth 2 (two) times daily. Swallow whole. May take with food to decrease nausea and vomiting., Disp: 60 tablet, Rfl: 11   ondansetron (ZOFRAN) 4 MG tablet, Take 1 tablet (4 mg total) by mouth every 6 (six) hours as needed for nausea., Disp: 20 tablet, Rfl: 0   simvastatin (ZOCOR) 20 MG tablet, Take 1 tablet (20 mg total) by mouth daily., Disp: 90 tablet, Rfl: 3  Review of Systems: Denies appetite changes, fevers, chills, fatigue, unexplained weight changes. Denies hearing loss, neck lumps or masses, mouth sores, ringing in ears or voice changes. Denies cough or wheezing.  Denies shortness of breath. Denies chest pain or palpitations. Denies leg swelling. Denies abdominal distention, pain, blood in stools, constipation, diarrhea, nausea, vomiting, or early satiety. Denies pain with intercourse, dysuria, frequency, hematuria or incontinence. Denies hot flashes, pelvic pain, vaginal bleeding or vaginal discharge.   Denies joint pain, back pain or muscle pain/cramps. Denies itching, rash, or wounds. Denies dizziness, headaches, numbness or seizures. Denies swollen lymph nodes or glands, denies easy bruising or bleeding. Denies anxiety, depression, confusion, or decreased concentration.  Physical Exam: BP 122/68 (BP Location: Left Arm, Patient Position: Sitting)   Pulse 68   Temp 97.6 F (36.4 C) (Oral)   Resp 20   Wt 148 lb (67.1 kg)   SpO2 98%   BMI 23.89 kg/m  General: Alert, oriented, no acute distress. HEENT: Posterior oropharynx clear, sclera anicteric. Chest: Clear to auscultation bilaterally.  No wheezes or rhonchi. Cardiovascular: Regular rate and rhythm, no murmurs. Abdomen: soft, nontender.   Normoactive bowel sounds.  No masses or hepatosplenomegaly appreciated.  Well-healed incisions.  I am unable to palpate any nodularity within the subcutaneous tissue of the right mid and lower abdomen. Extremities: Grossly normal range of motion.  Warm, well perfused.  No edema bilaterally. Skin: No rashes or lesions noted. Lymphatics: No cervical, supraclavicular, or inguinal adenopathy. GU: Normal appearing external genitalia without erythema, excoriation, or lesions.  Speculum exam reveals moderately atrophic vaginal mucosa, cuff intact, no masses appreciated.  Bimanual exam reveals no masses or nodularity.    Laboratory & Radiologic Studies:          Component Ref Range & Units (hover) 2 wk ago (05/19/23) 2 mo ago (03/14/23) 3 mo ago (02/11/23) 6 mo ago (11/29/22) 7 mo ago (11/01/22) 7 mo ago (10/21/22) 8 mo ago (09/30/22)  Cancer Antigen (CA) 125 9.0 7.8 CM 9.5 CM 8.3 CM 10.2 CM 10.6 CM 14.0       Assessment & Plan: April Weber is a 78 y.o. 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  HRD positive.  Olaparib started in mid 2024.  Recent history of significant for complications related to diverticular disease with development of a Colovesicular fistula.  She underwent sigmoid colectomy, diverting loop ileostomy and bladder repair in April 2024.  Ultimately had ileostomy reversal in September 2024.  Patient is overall doing well, NED on exam today. On imaging, there is an 11 mm subcutaneous nodule along the right anterior abdominal wall, new since her last CT scan.  Nonspecific.  This area looks like it was just adjacent to where her ileostomy was previously.  She is scheduled for repeat imaging in March.  She is overall tolerating PARPi without significant side effects.  She sees Dr. Bertis Ruddy in March. I will see her for follow-up in June.  20 minutes of total time was spent for this patient encounter, including preparation,  face-to-face counseling with the patient and coordination of care, and documentation of the encounter.  Eugene Garnet, MD  Division of Gynecologic Oncology  Department of Obstetrics and Gynecology  [facility]

## 2023-06-03 NOTE — Patient Instructions (Signed)
It was good to see you today.  I do not see or feel any evidence of cancer recurrence on your exam.  I will see you for follow-up in 5 months.  As always, if you develop any new and concerning symptoms before your next visit, please call to see me sooner.

## 2023-06-07 ENCOUNTER — Other Ambulatory Visit: Payer: Self-pay

## 2023-06-17 ENCOUNTER — Ambulatory Visit: Payer: Medicare Other | Admitting: Gynecologic Oncology

## 2023-06-27 ENCOUNTER — Other Ambulatory Visit (HOSPITAL_COMMUNITY): Payer: Self-pay

## 2023-06-27 NOTE — Progress Notes (Signed)
Specialty Pharmacy Refill Coordination Note  April Weber is a 78 y.o. female contacted today regarding refills of specialty medication(s) Olaparib [facility])   Patient requested Delivery   Delivery date: 07/19/23   Verified address: [address] [facility] [place] [address]   Medication will be filled on 07/18/23.

## 2023-06-27 NOTE — Progress Notes (Signed)
Specialty Pharmacy Ongoing Clinical Assessment Note  April Weber is a 78 y.o. female who is being followed by the specialty pharmacy service for RxSp Oncology   Patient's specialty medication(s) reviewed today: Olaparib [facility])   Missed doses in the last 4 weeks: 0   Patient/Caregiver did not have any additional questions or concerns.   Therapeutic benefit summary: Patient is achieving benefit   Adverse events/side effects summary: Experienced adverse events/side effects (nausea, diarrhea, and shortness of breath, tolerable at this time)   Patient's therapy is appropriate to: Continue    Goals Addressed   None     Follow up:  3 months  Servando Snare Specialty Pharmacist

## 2023-06-28 DIAGNOSIS — S92355A Nondisplaced fracture of fifth metatarsal bone, left foot, initial encounter for closed fracture: Secondary | ICD-10-CM | POA: Diagnosis not present

## 2023-07-04 ENCOUNTER — Telehealth: Payer: Self-pay | Admitting: Family Medicine

## 2023-07-04 NOTE — Telephone Encounter (Signed)
 I recommend that you only use cold medications that are safe in high blood pressure like Coricidin (generic is fine).  Other cold medications can increase your blood pressure.    - Get plenty of rest and drink plenty of fluids. - Try to breathe moist air. Use a cold mist humidifier. - Consume warm fluids (soup or tea) to provide relief for a stuffy nose and to loosen phlegm. - For nasal stuffiness, try saline nasal spray or a Neti Pot.  Afrin nasal spray can also be used but this product should not be used longer than 3 days or it will cause rebound nasal stuffiness (worsening nasal congestion). - For sore throat pain relief: use chloraseptic spray, suck on throat lozenges, hard candy or popsicles; gargle with warm salt water (1/4 tsp. salt per 8 oz. of water); and eat soft, bland foods. - Eat a well-balanced diet. If you cannot, ensure you are getting enough nutrients by taking a daily multivitamin. - Avoid dairy products, as they can thicken phlegm. - Avoid alcohol, as it impairs your body's immune system.

## 2023-07-06 NOTE — Telephone Encounter (Signed)
 I was able to contact patient and discussed with her my recommendations.  However, it sounds like much of her frustration is surrounding her indicating to whomever she talked to initially that she actually wanted to call back because she does not utilize Clinical cytogeneticist.  She voiced great appreciation for recommendations and I instructed her to contact us should she need an appointment going forward

## 2023-07-06 NOTE — Telephone Encounter (Signed)
 Pt states she has allergies and not sinus. Pt wants to know what she can take for allergies.  E2C2 told pt about message being sent to her mychart and pt was upset said that we don't have time to call back patients and message was sent to her mychart,Pt wants a call back from nurse.

## 2023-07-07 ENCOUNTER — Telehealth: Payer: Self-pay

## 2023-07-07 NOTE — Telephone Encounter (Signed)
 Returned her call. She is asking if she can take OTC sinus medication with Garey Ham. Told her per Dr. Bertis Ruddy no need to worry about OTC sinus medication. She verbalized understanding.

## 2023-07-11 ENCOUNTER — Other Ambulatory Visit (HOSPITAL_COMMUNITY): Payer: Self-pay

## 2023-07-11 ENCOUNTER — Other Ambulatory Visit: Payer: Self-pay

## 2023-07-14 ENCOUNTER — Ambulatory Visit (HOSPITAL_COMMUNITY)
Admission: RE | Admit: 2023-07-14 | Discharge: 2023-07-14 | Disposition: A | Discharge status: Home / Self Care | Payer: Medicare Other | Source: Ambulatory Visit | Attending: Hematology and Oncology | Admitting: Hematology and Oncology

## 2023-07-14 ENCOUNTER — Inpatient Hospital Stay: Payer: Medicare Other | Attending: Gynecologic Oncology

## 2023-07-14 DIAGNOSIS — Z853 Personal history of malignant neoplasm of breast: Secondary | ICD-10-CM | POA: Diagnosis not present

## 2023-07-14 DIAGNOSIS — N1831 Chronic kidney disease, stage 3a: Secondary | ICD-10-CM | POA: Insufficient documentation

## 2023-07-14 DIAGNOSIS — C561 Malignant neoplasm of right ovary: Secondary | ICD-10-CM | POA: Insufficient documentation

## 2023-07-14 DIAGNOSIS — K7689 Other specified diseases of liver: Secondary | ICD-10-CM | POA: Diagnosis not present

## 2023-07-14 DIAGNOSIS — Z87891 Personal history of nicotine dependence: Secondary | ICD-10-CM | POA: Insufficient documentation

## 2023-07-14 DIAGNOSIS — R159 Full incontinence of feces: Secondary | ICD-10-CM | POA: Diagnosis not present

## 2023-07-14 DIAGNOSIS — R110 Nausea: Secondary | ICD-10-CM | POA: Diagnosis not present

## 2023-07-14 LAB — COMPREHENSIVE METABOLIC PANEL
ALT: 13 U/L (ref 0–44)
AST: 18 U/L (ref 15–41)
Albumin: 4.1 g/dL (ref 3.5–5.0)
Alkaline Phosphatase: 56 U/L (ref 38–126)
Anion gap: 7 (ref 5–15)
BUN: 18 mg/dL (ref 8–23)
CO2: 27 mmol/L (ref 22–32)
Calcium: 9.3 mg/dL (ref 8.9–10.3)
Chloride: 103 mmol/L (ref 98–111)
Creatinine, Ser: 1.34 mg/dL — ABNORMAL HIGH (ref 0.44–1.00)
GFR, Estimated: 41 mL/min — ABNORMAL LOW (ref >60)
Glucose, Bld: 102 mg/dL — ABNORMAL HIGH (ref 70–99)
Potassium: 4.1 mmol/L (ref 3.5–5.1)
Sodium: 137 mmol/L (ref 135–145)
Total Bilirubin: 0.5 mg/dL (ref 0.0–1.2)
Total Protein: 6.5 g/dL (ref 6.5–8.1)

## 2023-07-14 LAB — CBC WITH DIFFERENTIAL/PLATELET
Abs Immature Granulocytes: 0.03 10*3/uL (ref 0.00–0.07)
Basophils Absolute: 0.1 10*3/uL (ref 0.0–0.1)
Basophils Relative: 1 %
Eosinophils Absolute: 0.5 10*3/uL (ref 0.0–0.5)
Eosinophils Relative: 7 %
HCT: 34.6 % — ABNORMAL LOW (ref 36.0–46.0)
Hemoglobin: 12.3 g/dL (ref 12.0–15.0)
Immature Granulocytes: 0 %
Lymphocytes Relative: 10 %
Lymphs Abs: 0.7 10*3/uL (ref 0.7–4.0)
MCH: 33.6 pg (ref 26.0–34.0)
MCHC: 35.5 g/dL (ref 30.0–36.0)
MCV: 94.5 fL (ref 80.0–100.0)
Monocytes Absolute: 0.7 10*3/uL (ref 0.1–1.0)
Monocytes Relative: 10 %
Neutro Abs: 4.9 10*3/uL (ref 1.7–7.7)
Neutrophils Relative %: 72 %
Platelets: 216 10*3/uL (ref 150–400)
RBC: 3.66 MIL/uL — ABNORMAL LOW (ref 3.87–5.11)
RDW: 12.8 % (ref 11.5–15.5)
WBC: 6.9 10*3/uL (ref 4.0–10.5)
nRBC: 0 % (ref 0.0–0.2)

## 2023-07-15 LAB — CA 125: Cancer Antigen (CA) 125: 9.6 U/mL (ref 0.0–38.1)

## 2023-07-21 ENCOUNTER — Encounter: Payer: Self-pay | Admitting: Hematology and Oncology

## 2023-07-21 ENCOUNTER — Inpatient Hospital Stay (HOSPITAL_BASED_OUTPATIENT_CLINIC_OR_DEPARTMENT_OTHER): Payer: Medicare Other | Admitting: Hematology and Oncology

## 2023-07-21 VITALS — BP 134/72 | HR 73 | Temp 98.1°F | Resp 18 | Ht 66.0 in | Wt 150.2 lb

## 2023-07-21 DIAGNOSIS — R110 Nausea: Secondary | ICD-10-CM | POA: Diagnosis not present

## 2023-07-21 DIAGNOSIS — R159 Full incontinence of feces: Secondary | ICD-10-CM | POA: Insufficient documentation

## 2023-07-21 DIAGNOSIS — C561 Malignant neoplasm of right ovary: Secondary | ICD-10-CM | POA: Diagnosis not present

## 2023-07-21 DIAGNOSIS — N1831 Chronic kidney disease, stage 3a: Secondary | ICD-10-CM | POA: Diagnosis not present

## 2023-07-21 DIAGNOSIS — Z87891 Personal history of nicotine dependence: Secondary | ICD-10-CM | POA: Diagnosis not present

## 2023-07-21 DIAGNOSIS — Z853 Personal history of malignant neoplasm of breast: Secondary | ICD-10-CM | POA: Diagnosis not present

## 2023-07-21 NOTE — Assessment & Plan Note (Addendum)
 She we will continue screening mammogram, next due in November 2025

## 2023-07-21 NOTE — Progress Notes (Signed)
 [facility] OFFICE PROGRESS NOTE  [name], DO as PCP - General (Family Medicine) [name], MD as Attending Physician (Obstetrics and Gynecology) [name], MD as Consulting Physician (Family Medicine) [name], OD (Optometry) [name], RN as [facility] Care Management Care, [facility] Services) [name], MD as Consulting Physician (Hematology and Oncology) [name], [name], MD as Consulting Physician (General Surgery) Noreene Larsson, RD as Dietitian (Dietician)  Assessment & Plan Right ovarian epithelial cancer [facility]) She was diagnosed with ovarian cancer in 2023, status post surgery and completion adjuvant treatment in April 2024 Final pathology: High grade papillary serous, 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  She had severe diverticulitis complicated by fistula requiring diverting loop ileostomy and aggressive antibiotics treatment, subsequently reversed She has been placed on olaparib since June 2024  I have reviewed her blood work and tumor marker with the patient I have independently reviewed CT imaging dated 07/21/2023 with the patient The plan will be to continue Olaparib I will see her again in 2 months for further follow-up She will complete treatment with olaparib June 2026 Her next imaging study would be in September Stage 3a chronic kidney disease (HCC) She is prone to get dehydrated I recommend frequent oral fluid intake Overall, her renal function is stable Incontinence of feces, unspecified fecal incontinence type She has chronic fecal incontinence since her surgery We discussed trial of high-fiber intake but the patient declined Nausea without vomiting Could be side effects of treatment She will continue intermittent antiemetics as needed History of breast cancer She we will continue screening mammogram, next due in November  2025  No orders of the defined types were placed in this encounter.    [name], MD  INTERVAL HISTORY: she returns for treatment follow-up Complications related to previous cycle of chemotherapy included nausea with/without vomiting, and fatigue, She also complained of intermittent fecal incontinence  PHYSICAL EXAMINATION: ECOG PERFORMANCE STATUS: 1 - Symptomatic but completely ambulatory  Vitals:   07/21/23 1010  BP: 134/72  Pulse: 73  Resp: 18  Temp: 98.1 F (36.7 C)  SpO2: 100%   Filed Weights   07/21/23 1010  Weight: 150 lb 3.2 oz (68.1 kg)    Relevant data reviewed during this visit included CBC, CMP and CT imaging

## 2023-07-21 NOTE — Assessment & Plan Note (Addendum)
 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

## 2023-07-21 NOTE — Assessment & Plan Note (Addendum)
She is prone to get dehydrated I recommend frequent oral fluid intake Overall, her renal function is stable

## 2023-07-21 NOTE — Assessment & Plan Note (Addendum)
 Could be side effects of treatment She will continue intermittent antiemetics as needed

## 2023-07-21 NOTE — Assessment & Plan Note (Addendum)
 She has chronic fecal incontinence since her surgery We discussed trial of high-fiber intake but the patient declined

## 2023-08-01 ENCOUNTER — Other Ambulatory Visit: Payer: Self-pay

## 2023-08-01 NOTE — Progress Notes (Signed)
 Specialty Pharmacy Refill Coordination Note  April Weber is a 78 y.o. female contacted today regarding refills of specialty medication(s) Olaparib [facility])   Patient requested Delivery   Delivery date: 08/11/23   Verified address: [address] [facility] [place] [address]   Medication will be filled on 08/10/23.

## 2023-08-04 ENCOUNTER — Other Ambulatory Visit (HOSPITAL_COMMUNITY): Payer: Self-pay

## 2023-08-05 ENCOUNTER — Other Ambulatory Visit: Payer: Medicare Other

## 2023-08-05 DIAGNOSIS — N189 Chronic kidney disease, unspecified: Secondary | ICD-10-CM | POA: Diagnosis not present

## 2023-08-05 DIAGNOSIS — D638 Anemia in other chronic diseases classified elsewhere: Secondary | ICD-10-CM | POA: Diagnosis not present

## 2023-08-10 ENCOUNTER — Other Ambulatory Visit: Payer: Self-pay

## 2023-08-11 LAB — LAB REPORT - SCANNED
A1c: 5.4
EGFR: 35
HM HIV Screening: NEGATIVE
HM Hepatitis Screen: NEGATIVE

## 2023-08-12 NOTE — Patient Instructions (Signed)
 Visit Information  Thank you for taking time to visit with me today. Please don't hesitate to contact me if I can be of assistance to you.   Following are the goals we discussed today:   Goals Addressed             This Visit's Progress    Patient will have improved bowel elimination-[facility] care coordination services   Not on track    Interventions Today    Flowsheet Row Most Recent Value  Chronic Disease   Chronic disease during today's visit Diabetes, Other  [when & what to eat after recent ileostomy reversal, Diarrhea]  General Interventions   General Interventions Discussed/Reviewed General Interventions Reviewed, Sick Day Rules, [facility], Doctor Visits  Doctor Visits Discussed/Reviewed Doctor Visits Reviewed, PCP, Specialist  PCP/Specialist Visits Compliance with follow-up visit  Education Interventions   Education Provided Provided Education  Provided Verbal Education On Nutrition, Sick Day Rules, Other  [managing loose stools, Food to take in after ileostomy reversal]  Mental Health Interventions   Mental Health Discussed/Reviewed Mental Health Reviewed, Coping Strategies  Nutrition Interventions   Nutrition Discussed/Reviewed Nutrition Reviewed, Carbohydrate meal planning, Fluid intake, Portion sizes  [BRAT (Banana, rice, applesauce, Toast) diet]  Pharmacy Interventions   Pharmacy Dicussed/Reviewed Pharmacy Topics Reviewed, Affording Medications  Safety Interventions   Safety Discussed/Reviewed Safety Reviewed, Fall Risk, Home Safety  Home Safety Assistive Devices              Our next appointment is no further scheduled appointments.  on as needed at as needed  Please call the care guide team at [phone] if you need to cancel or reschedule your appointment.   If you are experiencing a Mental Health or Behavioral Health Crisis or need someone to talk to, please call the Suicide and Crisis Lifeline: 988 call the [place] National Suicide Prevention  Lifeline: 845-764-2708 or TTY: 517-280-5637 TTY 6095164432) to talk to a trained counselor call 1-800-273-TALK (toll free, 24 hour hotline) call the [facility]: 937-610-7063 call 911   Patient verbalizes understanding of instructions and care plan provided today and agrees to view in MyChart. Active MyChart status and patient understanding of how to access instructions and care plan via MyChart confirmed with patient.     The patient has been provided with contact information for the care management team and has been advised to call with any health related questions or concerns.    Owenn Rothermel L. Noelle Penner, RN, BSN, CCM [facility] RN Care Manager [phone]

## 2023-08-22 DIAGNOSIS — I129 Hypertensive chronic kidney disease with stage 1 through stage 4 chronic kidney disease, or unspecified chronic kidney disease: Secondary | ICD-10-CM | POA: Diagnosis not present

## 2023-08-22 DIAGNOSIS — C569 Malignant neoplasm of unspecified ovary: Secondary | ICD-10-CM | POA: Diagnosis not present

## 2023-08-22 DIAGNOSIS — N270 Small kidney, unilateral: Secondary | ICD-10-CM | POA: Diagnosis not present

## 2023-08-22 DIAGNOSIS — N1832 Chronic kidney disease, stage 3b: Secondary | ICD-10-CM | POA: Diagnosis not present

## 2023-08-31 ENCOUNTER — Encounter: Payer: Self-pay | Admitting: Gastroenterology

## 2023-08-31 ENCOUNTER — Ambulatory Visit (INDEPENDENT_AMBULATORY_CARE_PROVIDER_SITE_OTHER): Admitting: Gastroenterology

## 2023-08-31 ENCOUNTER — Ambulatory Visit (INDEPENDENT_AMBULATORY_CARE_PROVIDER_SITE_OTHER)
Admission: RE | Admit: 2023-08-31 | Discharge: 2023-08-31 | Disposition: A | Discharge status: Home / Self Care | Source: Ambulatory Visit | Attending: Gastroenterology | Admitting: Gastroenterology

## 2023-08-31 DIAGNOSIS — K5900 Constipation, unspecified: Secondary | ICD-10-CM

## 2023-08-31 DIAGNOSIS — Z8719 Personal history of other diseases of the digestive system: Secondary | ICD-10-CM

## 2023-08-31 DIAGNOSIS — R159 Full incontinence of feces: Secondary | ICD-10-CM

## 2023-08-31 NOTE — Progress Notes (Signed)
 Chief Complaint: Incontinence  Referring Provider: Eliodoro Guerin, DO Primary GI MD: Dr. Lajuan Pila  HPI: April Weber is a 78 y.o. female with past medical history of diverticulitis s/p sigmoid colectomy with loop ileostomy 08/2022 and takedown 01/2023, Stage IIB ovarian cancer s/p R salpingo-oophorectomy, peritoneal biopsies, infracolic omentectomy 11/ 2023, breast cancer (1980) personal history of colon polyps no longer on surveillance, CKD, HTN, HLD, who presents today for a complaint of fecal incontinence.   She was last seen in GI clinic 02/15/2023 by Dr. Sandrea Cruel for intermittent diarrhea and fecal leakage which was thought to be due to prior surgeries and post-op diet. She was advised to gradually advance to higher-fiber foods.   CT A/P 03/18/2023 IMPRESSION: 1. Interval left hemicolectomy with rectosigmoid anastomosis. 2. 11 mm subcutaneous nodule anterior right abdominal wall is new in the interval. This is nonspecific and may be related to recent injection. Close follow-up recommended. 3. No findings of recurrent or metastatic disease on the current exam. 4.  Aortic Atherosclerosis (ICD10-I70.0).  Most recent CT A/P 07/21/2023 for ovarian cancer restaging IMPRESSION: 1. No acute process or evidence of metastatic disease in the abdomen or pelvis. The anterior right pelvic wall nodule on the prior exam is resolved. 2. Marked left renal atrophy.  Possible left nephrolithiasis. 3. Apparent gastric antral wall thickening could be due to underdistention or gastritis. 4.  Aortic Atherosclerosis (ICD10-I70.0).   Today, patient states  Discussed the use of AI scribe software for clinical note transcription with the patient, who gave verbal consent to proceed.  History of Present Illness [name] "April Weber" is a 78 year old female with diverticulitis and partial colectomy who presents with bowel irregularities and medication side effects.  She experiences  unpredictable bowel movements, described as 'squishies' and 'sliders,' which occur without warning. These episodes are more frequent on some days and are particularly problematic in public settings. They are not accompanied by diarrhea but rather a sensation of incomplete evacuation. Her bowel movements are often accompanied by gurgling noises, especially after eating, which subside after a while and do not disturb her sleep.  She has been taking Lynparza  since June of the previous year, which she believes contributes to her gastrointestinal symptoms, including diarrhea. Lynparza  also affects her kidney function, and she is under regular monitoring due to having only one kidney.  She has a history of diverticulitis and underwent surgery to remove part of her sigmoid colon. She remains cautious about her diet, avoiding certain foods like raw vegetables and skins to prevent potential flare-ups.  She experiences tenderness in her lower abdomen, which she attributes to arthritis in her hips and possibly residual effects from her surgery. Her surgical incisions continue to itch.  She has a history of constipation, which she managed with dietary adjustments. However, she finds that certain vegetables exacerbate her symptoms, causing significant gastrointestinal discomfort.  She is concerned about her nutritional intake and is awaiting blood work results to ensure her vitamin levels are adequate. Her recent A1c was 5.4, indicating good blood sugar control.   Previous GI Procedures   Colonoscopy 12/08/2020 (Dr. Savannah Curlin) - Non- bleeding internal hemorrhoids. - Severe diverticulosis.  - One 1 mm polyp at the hepatic flexure, removed with a cold snare. Resected and retrieved.  - The examination was otherwise normal on direct and retroflexion views.  Diagnosis Surgical [P], colon, hepatic flexure, polyp (1) - TUBULAR ADENOMA.  Past Medical History:  Diagnosis Date   Arthritis    Breast cancer (  HCC)  1980   RIGHT   Chronic kidney disease    has one kidney   Diverticulitis of colon    DVT (deep venous thrombosis) (HCC)    on RIGHT leg-hx of, superficial   Dyspnea    Headache    occ migraine   Hyperlipidemia    on meds   Hypertension    on meds   Osteopenia    Ovarian cancer (HCC) 03/2022   Peripheral vascular disease (HCC)    PONV (postoperative nausea and vomiting)    Seasonal allergies    Thyroid  disease    on meds    Past Surgical History:  Procedure Laterality Date   ABDOMINAL HYSTERECTOMY     APPENDECTOMY     BLADDER REPAIR  09/01/2022   Procedure: REPAIR BLADDER FISTULA WITH HYDRODISTENTION WITH METHYLINE BLUE;  Surgeon: Florencio Hunting, MD;  Location: [facility];  Service: Urology;;   BREAST BIOPSY     BREAST LUMPECTOMY WITH RADIOACTIVE SEED LOCALIZATION Left 12/01/2021   Procedure: LEFT BREAST RADIOACTIVE SEED GUIDED LUMPECTOMY;  Surgeon: Oza Blumenthal, MD;  Location: [facility];  Service: General;  Laterality: Left;  LMA   COLON RESECTION N/A 09/01/2022   Procedure: LAPAROSCOPIC SIGMOIDECTOMY CONVERTED TO OPEN, TAKEDOWN OF COLOVESICAL FISTULA AND LOOP COLOSTOMY;  Surgeon: Dorrie Gaudier Alphonso Aschoff, MD;  Location: [facility];  Service: General;  Laterality: N/A;   COLONOSCOPY  2011   Dr.[email or web address]-normal   CYSTOSCOPY WITH STENT PLACEMENT  09/01/2022   Procedure: CYSTOSCOPY WITH BILATERAL URETERAL STENT PLACEMENTS;  Surgeon: Florencio Hunting, MD;  Location: [facility];  Service: Urology;;   DEBULKING N/A 03/30/2022   Procedure: TUMOR DEBULKING;  Surgeon: Suzi Essex, MD;  Location: [facility];  Service: Gynecology;  Laterality: N/A;   EYE SURGERY     78 years old   ILEOSTOMY CLOSURE N/A 01/11/2023   Procedure: LOOP ILEOSTOMY REVERSAL;  Surgeon: Dorrie Gaudier Alphonso Aschoff, MD;  Location: [facility];  Service: General;  Laterality: N/A;   IR IMAGING GUIDED PORT INSERTION  03/19/2022   IR REMOVAL TUN ACCESS W/ PORT W/O FL MOD SED  11/15/2022   LAPAROSCOPY N/A 03/30/2022    Procedure: LAPAROSCOPY DIAGNOSTIC;  Surgeon: Suzi Essex, MD;  Location: [facility];  Service: Gynecology;  Laterality: N/A;   MASTECTOMY Right 1984   ROTATOR CUFF REPAIR     SALPINGOOPHORECTOMY Bilateral 03/30/2022   Procedure: OPEN SALPINGO OOPHORECTOMY;  Surgeon: Suzi Essex, MD;  Location: [facility];  Service: Gynecology;  Laterality: Bilateral;   THYROID  SURGERY Bilateral 1967   VARICOSE VEIN SURGERY     ablation   WISDOM TOOTH EXTRACTION      Current Outpatient Medications  Medication Sig Dispense Refill   acetaminophen  (TYLENOL ) 500 MG tablet Take 2 tablets (1,000 mg total) by mouth every 6 (six) hours as needed for mild pain. 30 tablet 0   amLODipine  (NORVASC ) 5 MG tablet Take 1 tablet (5 mg total) by mouth daily. 90 tablet 3   aspirin  EC 81 MG tablet Take 81 mg by mouth at bedtime. Swallow whole.     atenolol  (TENORMIN ) 25 MG tablet Take 0.5 tablets (12.5 mg total) by mouth 2 (two) times daily. 90 tablet 3   Calcium  Carb-Cholecalciferol  (CALCIUM  + VITAMIN D3 PO) Take 1 tablet by mouth 2 (two) times daily.     EPINEPHrine  0.3 mg/0.3 mL IJ SOAJ injection Inject 0.3 mg into the muscle as needed for anaphylaxis. 2 each 2   levothyroxine  (SYNTHROID ) 100 MCG tablet Take 1 tablet (100  mcg total) by mouth in the morning. 90 tablet 3   Multiple Vitamin (MULTIVITAMIN) tablet Take 1 tablet by mouth in the morning.     olaparib  (LYNPARZA ) 100 MG tablet Take 1 tablet (100 mg total) by mouth 2 (two) times daily. Swallow whole. May take with food to decrease nausea and vomiting. 60 tablet 11   ondansetron  (ZOFRAN ) 4 MG tablet Take 1 tablet (4 mg total) by mouth every 6 (six) hours as needed for nausea. 20 tablet 0   simvastatin  (ZOCOR ) 20 MG tablet Take 1 tablet (20 mg total) by mouth daily. 90 tablet 3   No current facility-administered medications for this visit.    Allergies as of 08/31/2023 - Review Complete 07/21/2023  Allergen Reaction Noted   Paclitaxel  Anaphylaxis 04/26/2022    Emend [fosaprepitant  dimeglumine] Other (See Comments) 05/17/2022   Codeine Nausea And Vomiting 02/09/2010   Nsaids Other (See Comments)    Penicillins Hives 02/09/2010   Vibra-tab [doxycycline] Hives 02/09/2010   Iodine  Rash 02/09/2010   Latex Rash     Family History  Problem Relation Age of Onset   Heart attack Mother    Alcohol abuse Mother    Lung cancer Mother        d. mid 39s   Breast cancer Maternal Aunt 86   Colon polyps Neg Hx    Colon cancer Neg Hx    Esophageal cancer Neg Hx    Stomach cancer Neg Hx    Rectal cancer Neg Hx     Social History   Tobacco Use   Smoking status: Former    Current packs/day: 0.00    Types: Cigarettes    Quit date: 05/11/1967    Years since quitting: 56.3    Passive exposure: Past   Smokeless tobacco: Never  Vaping Use   Vaping status: Never Used  Substance Use Topics   Alcohol use: No   Drug use: No     Review of Systems:    Constitutional: No weight loss, fever, chills, weakness or fatigue HEENT: Eyes: No change in vision Ears, Nose, Throat:  No change in hearing or congestion Skin: No rash or itching Cardiovascular: No chest pain, chest pressure or palpitations   Respiratory: No SOB or cough Gastrointestinal: See HPI and otherwise negative Genitourinary: No dysuria or change in urinary frequency Neurological: No headache, dizziness or syncope Musculoskeletal: No new muscle or joint pain Hematologic: No bleeding or bruising Psychiatric: No history of depression or anxiety    Physical Exam:  Vital signs: There were no vitals taken for this visit.  Constitutional: NAD, Well developed, Well nourished, alert and cooperative Head:  Normocephalic and atraumatic. Eyes:  EOMs intact. No scleral icterus. Conjunctiva pink. Respiratory: Respirations even and unlabored. Lungs clear to auscultation bilaterally.  No wheezes, crackles, or rhonchi.  Cardiovascular:  Regular rate and rhythm. No peripheral edema, cyanosis or  pallor.  Gastrointestinal:  Soft, nondistended, nontender. No rebound or guarding. Normal bowel sounds. No appreciable masses or hepatomegaly. Rectal:  Not performed.  Msk:  Symmetrical without gross deformities. Without edema, no deformity or joint abnormality.  Neurologic:  Alert and oriented x4;  grossly normal neurologically.  Skin:   Dry and intact without significant lesions or rashes. Psychiatric: Oriented to person, place and time. Demonstrates good judgement and reason without abnormal affect or behaviors.   RELEVANT LABS AND IMAGING: CBC    Component Value Date/Time   WBC 6.9 07/14/2023 0947   RBC 3.66 (L) 07/14/2023 0947   HGB 12.3  07/14/2023 0947   HGB 11.0 (L) 11/25/2022 0935   HCT 34.6 (L) 07/14/2023 0947   HCT 33.0 (L) 11/25/2022 0935   PLT 216 07/14/2023 0947   PLT 198 11/25/2022 0935   MCV 94.5 07/14/2023 0947   MCV 98 (H) 11/25/2022 0935   MCH 33.6 07/14/2023 0947   MCHC 35.5 07/14/2023 0947   RDW 12.8 07/14/2023 0947   RDW 14.7 11/25/2022 0935   LYMPHSABS 0.7 07/14/2023 0947   LYMPHSABS 1.3 11/17/2020 0913   MONOABS 0.7 07/14/2023 0947   EOSABS 0.5 07/14/2023 0947   EOSABS 0.3 11/17/2020 0913   BASOSABS 0.1 07/14/2023 0947   BASOSABS 0.1 11/17/2020 0913    CMP     Component Value Date/Time   NA 137 07/14/2023 0947   NA 135 11/25/2022 0935   K 4.1 07/14/2023 0947   CL 103 07/14/2023 0947   CO2 27 07/14/2023 0947   GLUCOSE 102 (H) 07/14/2023 0947   BUN 18 07/14/2023 0947   BUN 21 11/25/2022 0935   CREATININE 1.34 (H) 07/14/2023 0947   CREATININE 0.93 08/10/2022 0857   CREATININE 0.99 02/05/2013 0757   CALCIUM  9.3 07/14/2023 0947   PROT 6.5 07/14/2023 0947   PROT 6.5 11/25/2022 0935   ALBUMIN  4.1 07/14/2023 0947   ALBUMIN  4.2 11/25/2022 0935   AST 18 07/14/2023 0947   AST 12 (L) 08/10/2022 0857   ALT 13 07/14/2023 0947   ALT 11 08/10/2022 0857   ALKPHOS 56 07/14/2023 0947   BILITOT 0.5 07/14/2023 0947   BILITOT 0.5 11/25/2022 0935    BILITOT 0.3 08/10/2022 0857   GFRNONAA 41 (L) 07/14/2023 0947   GFRNONAA >60 08/10/2022 0857   GFRNONAA 59 (L) 02/05/2013 0757   GFRAA 61 05/20/2020 0850   GFRAA 68 02/05/2013 0757     Assessment/Plan:   78 year old female with history of multiple abdominal surgeries, receiving Olaparib  therapy for 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  History of diverticulitis  S/p sigmoid colectomy with loop ileostomy 08/2022 and takedown 01/2023  Fecal incontinence Continue high-fiber diet If diarrhea, consider GI pathogen panel including C. diff, fecal calprotectin, ESR, CRP Consider referral for pelvic floor PT   Valiant Gaul, PA-C [facility] 08/31/2023, 1:44 PM  Patient Care Team: Eliodoro Guerin, DO as PCP - General (Family Medicine) Bonita Bussing, MD as Attending Physician (Obstetrics and Gynecology) Rica Chalet, MD as Consulting Physician (Family Medicine) Alexia Idler, OD (Optometry) Care, [facility]) Almeda Jacobs, MD as Consulting Physician (Hematology and Oncology) [name], Alphonso Aschoff, MD as Consulting Physician (General Surgery) Woody Heading, RD as Dietitian (Dietician)       Attending physician's note   I have taken history, reviewed the chart and examined the patient. I performed a substantive portion of this encounter, including complete performance of at least one of the key components, in conjunction with the APP. I agree with the Advanced Practitioner's note, impression and recommendations. Daughter is PA  CT Abdo/pelvis 07/2023 reviewed independently and with patient.  Significant stool in the right side of the colon  Diverticulitis s/p sigmoid colectomy with loop ileostomy 08/2022 and takedown 01/2023 Constipation on CT with overflow incontinence   Plan: X-Ray KUB- 2 View.  If still with significant stool,  trial of MiraLAX  High Fiber diet Plenty of water. Reassured pt.   Magnus Schuller, MD [facility] [phone]

## 2023-08-31 NOTE — Patient Instructions (Addendum)
 _______________________________________________________  If your blood pressure at your visit was 140/90 or greater, please contact your primary care physician to follow up on this. _______________________________________________________  If you are age 79 or older, your body mass index should be between 23-30. Your Body mass index is 23.49 kg/m. If this is out of the aforementioned range listed, please consider follow up with your Primary Care Provider. ________________________________________________________  The [facility] providers would like to encourage you to use MYCHART to communicate with providers for non-urgent requests or questions.  Due to long hold times on the telephone, sending your provider a message by [facility] may be a faster and more efficient way to get a response.  Please allow 48 business hours for a response.  Please remember that this is for non-urgent requests.  _______________________________________________________  Please drink plenty of water each day.  Your provider has requested that you have an abdominal x ray before leaving today. Please go to the basement floor to our Radiology department for the test.  Due to recent changes in healthcare laws, you may see the results of your imaging and laboratory studies on MyChart before your provider has had a chance to review them.  We understand that in some cases there may be results that are confusing or concerning to you. Not all laboratory results come back in the same time frame and the provider may be waiting for multiple results in order to interpret others.  Please give us  48 hours in order for your provider to thoroughly review all the results before contacting the office for clarification of your results.   High-Fiber Eating Plan  Fiber, also called dietary fiber, is found in foods such as fruits, vegetables, whole grains, and beans. A high-fiber diet can be good for your health. Your health care provider may  recommend a high-fiber diet to help: Prevent trouble pooping (constipation). Lower your cholesterol. Treat the following conditions: Hemorrhoids. This is inflammation of veins in the anus. Inflammation of specific areas of the digestive tract. Irritable bowel syndrome (IBS). This is a problem of the large intestine, also called the colon, that sometimes causes belly pain and bloating. Prevent overeating as part of a weight-loss plan. Lower the risk of heart disease, type 2 diabetes, and certain cancers. What are tips for following this plan? Reading food labels  Check the nutrition facts label on foods for the amount of dietary fiber. Choose foods that have 4 grams of fiber or more per serving. The recommended goals for how much fiber you should eat each day include: Males 67 years old or younger: 30-34 g. Males over 34 years old: 28-34 g. Females 3 years old or younger: 25-28 g. Females over 7 years old: 22-25 g. Your daily fiber goal is _____________ g. Shopping Choose whole fruits and vegetables instead of processed. For example, choose apples instead of apple juice or applesauce. Choose a variety of high-fiber foods such as avocados, lentils, oats, and pinto beans. Read the nutrition facts label on foods. Check for foods with added fiber. These foods often have high sugar and salt (sodium) amounts per serving. Cooking Use whole-grain flour for baking and cooking. Cook with brown rice instead of white rice. Make meals that have a lot of beans and vegetables in them, such as chili or vegetable-based soups. Meal planning Start the day with a breakfast that is high in fiber, such as a cereal that has 5 g of fiber or more per serving. Eat breads and cereals that are made  with whole-grain flour instead of refined flour or white flour. Eat brown rice, bulgur wheat, or millet instead of white rice. Use beans in place of meat in soups, salads, and pasta dishes. Be sure that half of the  grains you eat each day are whole grains. General information You can get the recommended amount of dietary fiber by: Eating a variety of fruits, vegetables, grains, nuts, and beans. Taking a fiber supplement if you aren't able to eat enough fiber. It's better to get fiber through food than from a supplement. Slowly increase how much fiber you eat. If you increase the amount of fiber you eat too quickly, you may have bloating, cramping, or gas. Drink plenty of water to help you digest fiber. Choose high-fiber snacks, such as berries, raw vegetables, nuts, and popcorn. What foods should I eat? Fruits Berries. Pears. Apples. Oranges. Avocado. Prunes and raisins. Dried figs. Vegetables Sweet potatoes. Spinach. Kale. Artichokes. Cabbage. Broccoli. Cauliflower. Green peas. Carrots. Squash. Grains Whole-grain breads. Multigrain cereal. Oats and oatmeal. Brown rice. Barley. Bulgur wheat. Millet. Quinoa. Bran muffins. Popcorn. Rye wafer crackers. Meats and other proteins Navy beans, kidney beans, and pinto beans. Soybeans. Split peas. Lentils. Nuts and seeds. Dairy Fiber-fortified yogurt. Fortified means that fiber has been added to the product. Beverages Fiber-fortified soy milk. Fiber-fortified orange juice. Other foods Fiber bars. The items listed above may not be all the foods and drinks you can have. Talk to a dietitian to learn more. What foods should I avoid? Fruits Fruit juice. Cooked, strained fruit. Vegetables Fried potatoes. Canned vegetables. Well-cooked vegetables. Grains White bread. Pasta made with refined flour. White rice. Meats and other proteins Fatty meat. Fried chicken or fried fish. Dairy Milk. Cream cheese. Sour cream. Fats and oils Butters. Beverages Soft drinks. Other foods Cakes and pastries. The items listed above may not be all the foods and drinks you should avoid. Talk to a dietitian to learn more. This information is not intended to replace advice  given to you by your health care provider. Make sure you discuss any questions you have with your health care provider. Document Revised: 07/19/2022 Document Reviewed: 07/19/2022 Elsevier Patient Education  2024 [employer].

## 2023-09-04 NOTE — Progress Notes (Signed)
 Please inform the patient. X-ray KUB does show moderate stool in the right colon. She needs to start MiraLAX  17 g p.o. twice daily until a large bowel movement, then once a day. Repeat x-ray KUB 2 view in 2 weeks Send report to family physician

## 2023-09-05 ENCOUNTER — Other Ambulatory Visit: Payer: Self-pay

## 2023-09-05 DIAGNOSIS — K5900 Constipation, unspecified: Secondary | ICD-10-CM

## 2023-09-06 ENCOUNTER — Other Ambulatory Visit: Payer: Self-pay

## 2023-09-06 NOTE — Progress Notes (Signed)
 Specialty Pharmacy Ongoing Clinical Assessment Note  April Weber is a 78 y.o. female who is being followed by the specialty pharmacy service for RxSp Oncology   Patient's specialty medication(s) reviewed today: Olaparib  (LYNPARZA )   Missed doses in the last 4 weeks: 0   Patient/Caregiver did not have any additional questions or concerns.   Therapeutic benefit summary: Patient is achieving benefit   Adverse events/side effects summary: No adverse events/side effects (continued intermittent fatigue and dizziness, remains tolerable)   Patient's therapy is appropriate to: Continue    Goals Addressed             This Visit's Progress    Maintain optimal adherence to therapy   On track    Patient is on track. Patient will maintain adherence         Follow up:  3 months  April Weber Specialty Pharmacist

## 2023-09-06 NOTE — Progress Notes (Signed)
 Specialty Pharmacy Refill Coordination Note  April Weber is a 78 y.o. female contacted today regarding refills of specialty medication(s) Olaparib  (LYNPARZA )   Patient requested Delivery   Delivery date: 09/26/23   Verified address: [address] [place] [address]   Medication will be filled on 09/23/23.

## 2023-09-08 ENCOUNTER — Telehealth: Payer: Self-pay | Admitting: Gastroenterology

## 2023-09-08 NOTE — Telephone Encounter (Signed)
 Spoke with patient regarding recent XR results. She is having multiple formed stools daily taking miralax  BID & wanted advice if she should continue at the same dose. Advised her she could titrate back to once a day or 1/2 capful a day if needed. She will come for XR in a couple of weeks to reevaluate. We also scheduled her a f/u if she needs it for 11/16/23 at 9:30 am with Dr. Venice Gillis.

## 2023-09-08 NOTE — Telephone Encounter (Signed)
 Requesting to speak wit a nurse in regards to previous apt. Please advise.   Thank you

## 2023-09-15 ENCOUNTER — Inpatient Hospital Stay: Attending: Gynecologic Oncology

## 2023-09-15 ENCOUNTER — Inpatient Hospital Stay (HOSPITAL_BASED_OUTPATIENT_CLINIC_OR_DEPARTMENT_OTHER): Admitting: Hematology and Oncology

## 2023-09-15 ENCOUNTER — Other Ambulatory Visit: Payer: Self-pay | Admitting: Hematology and Oncology

## 2023-09-15 ENCOUNTER — Encounter: Payer: Self-pay | Admitting: Hematology and Oncology

## 2023-09-15 ENCOUNTER — Other Ambulatory Visit: Payer: Self-pay

## 2023-09-15 VITALS — BP 144/80 | HR 63 | Temp 98.1°F | Resp 18 | Ht 67.0 in | Wt 151.0 lb

## 2023-09-15 DIAGNOSIS — C561 Malignant neoplasm of right ovary: Secondary | ICD-10-CM

## 2023-09-15 DIAGNOSIS — N1831 Chronic kidney disease, stage 3a: Secondary | ICD-10-CM | POA: Diagnosis not present

## 2023-09-15 LAB — CBC WITH DIFFERENTIAL/PLATELET
Abs Immature Granulocytes: 0.03 10*3/uL (ref 0.00–0.07)
Basophils Absolute: 0.1 10*3/uL (ref 0.0–0.1)
Basophils Relative: 1 %
Eosinophils Absolute: 0.2 10*3/uL (ref 0.0–0.5)
Eosinophils Relative: 3 %
HCT: 37.1 % (ref 36.0–46.0)
Hemoglobin: 13.1 g/dL (ref 12.0–15.0)
Immature Granulocytes: 0 %
Lymphocytes Relative: 14 %
Lymphs Abs: 1.1 10*3/uL (ref 0.7–4.0)
MCH: 33.7 pg (ref 26.0–34.0)
MCHC: 35.3 g/dL (ref 30.0–36.0)
MCV: 95.4 fL (ref 80.0–100.0)
Monocytes Absolute: 0.6 10*3/uL (ref 0.1–1.0)
Monocytes Relative: 9 %
Neutro Abs: 5.4 10*3/uL (ref 1.7–7.7)
Neutrophils Relative %: 73 %
Platelets: 236 10*3/uL (ref 150–400)
RBC: 3.89 MIL/uL (ref 3.87–5.11)
RDW: 13.2 % (ref 11.5–15.5)
WBC: 7.4 10*3/uL (ref 4.0–10.5)
nRBC: 0 % (ref 0.0–0.2)

## 2023-09-15 LAB — COMPREHENSIVE METABOLIC PANEL WITH GFR
ALT: 16 U/L (ref 0–44)
AST: 21 U/L (ref 15–41)
Albumin: 4.6 g/dL (ref 3.5–5.0)
Alkaline Phosphatase: 60 U/L (ref 38–126)
Anion gap: 10 (ref 5–15)
BUN: 21 mg/dL (ref 8–23)
CO2: 27 mmol/L (ref 22–32)
Calcium: 9.3 mg/dL (ref 8.9–10.3)
Chloride: 99 mmol/L (ref 98–111)
Creatinine, Ser: 1.47 mg/dL — ABNORMAL HIGH (ref 0.44–1.00)
GFR, Estimated: 36 mL/min — ABNORMAL LOW (ref >60)
Glucose, Bld: 83 mg/dL (ref 70–99)
Potassium: 3.8 mmol/L (ref 3.5–5.1)
Sodium: 136 mmol/L (ref 135–145)
Total Bilirubin: 0.6 mg/dL (ref 0.0–1.2)
Total Protein: 6.8 g/dL (ref 6.5–8.1)

## 2023-09-15 NOTE — Progress Notes (Signed)
 [facility] OFFICE PROGRESS NOTE  Patient Care Team: Eliodoro Guerin, DO as PCP - General (Family Medicine) Bonita Bussing, MD as Attending Physician (Obstetrics and Gynecology) Rica Chalet, MD as Consulting Physician (Family Medicine) Alexia Idler, OD (Optometry) Care, [facility]) Almeda Jacobs, MD as Consulting Physician (Hematology and Oncology) [name], Alphonso Aschoff, MD as Consulting Physician (General Surgery) Woody Heading, RD as Dietitian (Dietician)  Assessment & Plan Right ovarian epithelial cancer [facility]) She was diagnosed with ovarian cancer in 2023, status post surgery and completion adjuvant treatment in April 2024 Final pathology: High grade papillary serous, 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  She had severe diverticulitis complicated by fistula requiring diverting loop ileostomy and aggressive antibiotics treatment, subsequently reversed She has been placed on olaparib  since June 2024  She tolerated elaborate well Labs are satisfactory The plan will be to continue Olaparib  at current dose I will see her again in 2 months for further follow-up She will complete treatment with olaparib  June 2026 Her next imaging study would be in September Stage 3a chronic kidney disease (HCC) She is prone to get dehydrated I recommend frequent oral fluid intake Overall, her renal function is stable  No orders of the defined types were placed in this encounter.    Almeda Jacobs, MD  INTERVAL HISTORY: she returns for treatment follow-up Complications related to previous cycle of chemotherapy included intermittent incontinent episodes after her surgerywhich is unrelated to side effects of treatment  PHYSICAL EXAMINATION: ECOG PERFORMANCE STATUS: 1 - Symptomatic but completely ambulatory  Vitals:   09/15/23 1017  BP: (!) 144/80  Pulse: 63  Resp: 18  Temp: 98.1 F (36.7 C)  SpO2: 100%   Filed Weights    09/15/23 1017  Weight: 151 lb (68.5 kg)    Relevant data reviewed during this visit included CBC and CMP

## 2023-09-15 NOTE — Assessment & Plan Note (Addendum)
 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

## 2023-09-15 NOTE — Assessment & Plan Note (Addendum)
She is prone to get dehydrated I recommend frequent oral fluid intake Overall, her renal function is stable

## 2023-09-16 LAB — CA 125: Cancer Antigen (CA) 125: 10.9 U/mL (ref 0.0–38.1)

## 2023-09-20 ENCOUNTER — Ambulatory Visit (INDEPENDENT_AMBULATORY_CARE_PROVIDER_SITE_OTHER)
Admission: RE | Admit: 2023-09-20 | Discharge: 2023-09-20 | Disposition: A | Discharge status: Home / Self Care | Source: Ambulatory Visit | Attending: Gastroenterology | Admitting: Gastroenterology

## 2023-09-20 DIAGNOSIS — K5900 Constipation, unspecified: Secondary | ICD-10-CM

## 2023-09-21 ENCOUNTER — Other Ambulatory Visit: Payer: Self-pay

## 2023-09-23 DIAGNOSIS — S92355A Nondisplaced fracture of fifth metatarsal bone, left foot, initial encounter for closed fracture: Secondary | ICD-10-CM | POA: Diagnosis not present

## 2023-09-26 ENCOUNTER — Ambulatory Visit: Payer: Self-pay | Admitting: Gastroenterology

## 2023-09-30 ENCOUNTER — Telehealth: Payer: Self-pay | Admitting: Gastroenterology

## 2023-09-30 NOTE — Telephone Encounter (Signed)
 Pt stated that she is still having episodes of mushiness BM with incontinence throughout the day. Last BM was today.  Pt was recommended to try a Miralax  Purge. Instructions were provided to pt. Pt was scheduled to see Dr. Venice Gillis on 10/19/2023 at 8:50. Pt made aware.  Pt verbalized understanding with all questions answered.

## 2023-09-30 NOTE — Telephone Encounter (Signed)
 Patient is calling to discuss her miralax  regimen that she is currently on. Patient stated that she is not sure is she has been doing it right and would like to know if it is not working then if she could switch to something else. Patient is requesting a call back. Please advise.

## 2023-10-10 ENCOUNTER — Other Ambulatory Visit: Payer: Medicare Other

## 2023-10-10 DIAGNOSIS — E890 Postprocedural hypothyroidism: Secondary | ICD-10-CM

## 2023-10-10 DIAGNOSIS — N1831 Chronic kidney disease, stage 3a: Secondary | ICD-10-CM

## 2023-10-10 DIAGNOSIS — Z932 Ileostomy status: Secondary | ICD-10-CM

## 2023-10-11 ENCOUNTER — Ambulatory Visit: Payer: Self-pay | Admitting: Family Medicine

## 2023-10-11 LAB — CMP14+EGFR
ALT: 16 IU/L (ref 0–32)
AST: 22 IU/L (ref 0–40)
Albumin: 4.4 g/dL (ref 3.8–4.8)
Alkaline Phosphatase: 67 IU/L (ref 44–121)
BUN/Creatinine Ratio: 14 (ref 12–28)
BUN: 20 mg/dL (ref 8–27)
Bilirubin Total: 0.5 mg/dL (ref 0.0–1.2)
CO2: 19 mmol/L — ABNORMAL LOW (ref 20–29)
Calcium: 9.6 mg/dL (ref 8.7–10.3)
Chloride: 101 mmol/L (ref 96–106)
Creatinine, Ser: 1.43 mg/dL — ABNORMAL HIGH (ref 0.57–1.00)
Globulin, Total: 2.3 g/dL (ref 1.5–4.5)
Glucose: 101 mg/dL — ABNORMAL HIGH (ref 70–99)
Potassium: 4.5 mmol/L (ref 3.5–5.2)
Sodium: 139 mmol/L (ref 134–144)
Total Protein: 6.7 g/dL (ref 6.0–8.5)
eGFR: 38 mL/min/{1.73_m2} — ABNORMAL LOW (ref >59)

## 2023-10-11 LAB — T4, FREE: Free T4: 1.54 ng/dL (ref 0.82–1.77)

## 2023-10-11 LAB — TSH: TSH: 1.51 u[IU]/mL (ref 0.450–4.500)

## 2023-10-12 NOTE — Progress Notes (Deleted)
 April Weber is a 78 y.o. female presents to office today for annual physical exam examination.    Concerns today include: 1. ***  Occupation: ***, Marital status: ***, Substance use: *** Health Maintenance Due  Topic Date Due   Zoster Vaccines- Shingrix (1 of 2) 10/14/1964   MAMMOGRAM  10/12/2022   COVID-19 Vaccine (8 - 2024-25 season) 01/09/2023   Refills needed today: ***  Immunization History  Administered Date(s) Administered   Fluad Quad(high Dose 65+) 02/09/2019, 02/13/2020, 02/18/2021, 02/23/2022   Influenza Whole 02/16/2010   Influenza, High Dose Seasonal PF 02/12/2015, 03/05/2016, 03/01/2017, 02/17/2018   Influenza,inj,Quad PF,6+ Mos 02/13/2013, 02/13/2014   Moderna Sars-Covid-2 Vaccination 06/15/2019, 07/14/2019, 03/11/2020, 08/19/2020, 01/27/2021   Pfizer(Comirnaty)Fall Seasonal Vaccine 12 years and older 02/16/2022   Pneumococcal Conjugate-13 08/14/2014   Pneumococcal Polysaccharide-23 10/24/2009   Td 01/09/2019, 01/09/2019   Tdap 08/28/2008   Unspecified SARS-COV-2 Vaccination 08/19/2020   Zoster, Live 03/23/2007   Past Medical History:  Diagnosis Date   Arthritis    Breast cancer (HCC) 1980   RIGHT   Chronic kidney disease    has one kidney   Diverticulitis of colon    DVT (deep venous thrombosis) (HCC)    on RIGHT leg-hx of, superficial   Dyspnea    Headache    occ migraine   Hyperlipidemia    on meds   Hypertension    on meds   Osteopenia    Ovarian cancer (HCC) 03/2022   Peripheral vascular disease (HCC)    PONV (postoperative nausea and vomiting)    Seasonal allergies    Thyroid  disease    on meds   Social History   Socioeconomic History   Marital status: Single    Spouse name: Divorced    Number of children: 1   Years of education: Not on file   Highest education level: Not on file  Occupational History   Occupation: 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  Tobacco Use   Smoking  status: Former    Current packs/day: 0.00    Types: Cigarettes    Quit date: 05/11/1967    Years since quitting: 56.4    Passive exposure: Past   Smokeless tobacco: Never  Vaping Use   Vaping status: Never Used  Substance and Sexual Activity   Alcohol use: No   Drug use: No   Sexual activity: Not Currently    Birth control/protection: Post-menopausal, Surgical    Comment: hyst  Other Topics Concern   Not on file  Social History Narrative   Lives alone - very close with her neighbors   Social Drivers of Corporate investment banker Strain: Low Risk  (01/25/2023)   Overall Financial Resource Strain (CARDIA)    Difficulty of Paying Living Expenses: Not hard at all  Food Insecurity: No Food Insecurity (01/25/2023)   Hunger Vital Sign    Worried About Radiation protection practitioner of Food in the Last Year: Never true    Ran Out of Food in the Last Year: Never true  Transportation Needs: No Transportation Needs (01/25/2023)   PRAPARE - Administrator, Civil Service (Medical): No    Lack of Transportation (Non-Medical): No  Physical Activity: Insufficiently Active (01/25/2023)   Exercise Vital Sign    Days of Exercise per Week: 3 days    Minutes of Exercise per Session: 30 min  Stress: No Stress Concern Present (01/25/2023)   [employer] of Occupational Health -  Occupational Stress Questionnaire    Feeling of Stress : Not at all  Social Connections: Unknown (01/25/2023)   Social Connection and Isolation Panel [NHANES]    Frequency of Communication with Friends and Family: More than three times a week    Frequency of Social Gatherings with Friends and Family: More than three times a week    Attends Religious Services: More than 4 times per year    Active Member of Clubs or Organizations: Yes    Attends Banker Meetings: More than 4 times per year    Marital Status: Patient declined  Intimate Partner Violence: Not At Risk (01/25/2023)   Humiliation, Afraid, Rape, and Kick  questionnaire    Fear of Current or Ex-Partner: No    Emotionally Abused: No    Physically Abused: No    Sexually Abused: No   Past Surgical History:  Procedure Laterality Date   ABDOMINAL HYSTERECTOMY     APPENDECTOMY     BLADDER REPAIR  09/01/2022   Procedure: REPAIR BLADDER FISTULA WITH HYDRODISTENTION WITH METHYLINE BLUE;  Surgeon: Florencio Hunting, MD;  Location: [facility];  Service: Urology;;   BREAST BIOPSY     BREAST LUMPECTOMY WITH RADIOACTIVE SEED LOCALIZATION Left 12/01/2021   Procedure: LEFT BREAST RADIOACTIVE SEED GUIDED LUMPECTOMY;  Surgeon: Oza Blumenthal, MD;  Location: [facility];  Service: General;  Laterality: Left;  LMA   COLON RESECTION N/A 09/01/2022   Procedure: LAPAROSCOPIC SIGMOIDECTOMY CONVERTED TO OPEN, TAKEDOWN OF COLOVESICAL FISTULA AND LOOP COLOSTOMY;  Surgeon: [name], Alphonso Aschoff, MD;  Location: [facility];  Service: General;  Laterality: N/A;   COLONOSCOPY  2011   Dr.[email or web address]   CYSTOSCOPY WITH STENT PLACEMENT  09/01/2022   Procedure: CYSTOSCOPY WITH BILATERAL URETERAL STENT PLACEMENTS;  Surgeon: Florencio Hunting, MD;  Location: [facility];  Service: Urology;;   DEBULKING N/A 03/30/2022   Procedure: TUMOR DEBULKING;  Surgeon: Suzi Essex, MD;  Location: [facility];  Service: Gynecology;  Laterality: N/A;   EYE SURGERY     78 years old   ILEOSTOMY CLOSURE N/A 01/11/2023   Procedure: LOOP ILEOSTOMY REVERSAL;  Surgeon: Dorrie Gaudier Alphonso Aschoff, MD;  Location: [facility];  Service: General;  Laterality: N/A;   IR IMAGING GUIDED PORT INSERTION  03/19/2022   IR REMOVAL TUN ACCESS W/ PORT W/O FL MOD SED  11/15/2022   LAPAROSCOPY N/A 03/30/2022   Procedure: LAPAROSCOPY DIAGNOSTIC;  Surgeon: Suzi Essex, MD;  Location: [facility];  Service: Gynecology;  Laterality: N/A;   MASTECTOMY Right 1984   ROTATOR CUFF REPAIR     SALPINGOOPHORECTOMY Bilateral 03/30/2022   Procedure: OPEN SALPINGO OOPHORECTOMY;  Surgeon: Suzi Essex, MD;  Location: [facility];   Service: Gynecology;  Laterality: Bilateral;   THYROID  SURGERY Bilateral 1967   VARICOSE VEIN SURGERY     ablation   WISDOM TOOTH EXTRACTION     Family History  Problem Relation Age of Onset   Heart attack Mother    Alcohol abuse Mother    Lung cancer Mother        d. mid 43s   Breast cancer Maternal Aunt 13   Colon polyps Neg Hx    Colon cancer Neg Hx    Esophageal cancer Neg Hx    Stomach cancer Neg Hx    Rectal cancer Neg Hx     Current Outpatient Medications:    acetaminophen  (TYLENOL ) 500 MG tablet, Take 2 tablets (1,000 mg total) by mouth every 6 (six) hours as needed for mild pain.,  Disp: 30 tablet, Rfl: 0   amLODipine  (NORVASC ) 5 MG tablet, Take 1 tablet (5 mg total) by mouth daily., Disp: 90 tablet, Rfl: 3   aspirin  EC 81 MG tablet, Take 81 mg by mouth at bedtime. Swallow whole., Disp: , Rfl:    atenolol  (TENORMIN ) 25 MG tablet, Take 0.5 tablets (12.5 mg total) by mouth 2 (two) times daily., Disp: 90 tablet, Rfl: 3   Calcium  Carb-Cholecalciferol  (CALCIUM  + VITAMIN D3 PO), Take 1 tablet by mouth 2 (two) times daily., Disp: , Rfl:    EPINEPHrine  0.3 mg/0.3 mL IJ SOAJ injection, Inject 0.3 mg into the muscle as needed for anaphylaxis. (Patient not taking: Reported on 08/31/2023), Disp: 2 each, Rfl: 2   levothyroxine  (SYNTHROID ) 100 MCG tablet, Take 1 tablet (100 mcg total) by mouth in the morning., Disp: 90 tablet, Rfl: 3   Multiple Vitamin (MULTIVITAMIN) tablet, Take 1 tablet by mouth in the morning., Disp: , Rfl:    olaparib  (LYNPARZA ) 100 MG tablet, Take 1 tablet (100 mg total) by mouth 2 (two) times daily. Swallow whole. May take with food to decrease nausea and vomiting., Disp: 60 tablet, Rfl: 11   ondansetron  (ZOFRAN ) 4 MG tablet, Take 1 tablet (4 mg total) by mouth every 6 (six) hours as needed for nausea. (Patient not taking: Reported on 08/31/2023), Disp: 20 tablet, Rfl: 0   simvastatin  (ZOCOR ) 20 MG tablet, Take 1 tablet (20 mg total) by mouth daily., Disp: 90 tablet,  Rfl: 3  Allergies  Allergen Reactions   Paclitaxel  Anaphylaxis   Emend [Fosaprepitant  Dimeglumine] Other (See Comments)    3 minutes into emend infusion patient began having facial flushing and redness to bilateral hands and neck, and light headedness   Codeine Nausea And Vomiting   Nsaids Other (See Comments)    Told to avoid due to kidney function   Penicillins Hives   Vibra-Tab [Doxycycline] Hives   Iodine  Rash   Latex Rash    Occasionally gets a localized rash on contact with certain latex products     ROS: Review of Systems {ros; complete:30496}    Physical exam {Exam, [phone]}      04/04/2023    1:54 PM 01/25/2023    9:02 AM 11/30/2022    7:54 AM  Depression screen PHQ 2/9  Decreased Interest 0 0 0  Down, Depressed, Hopeless 0 0 0  PHQ - 2 Score 0 0 0  Altered sleeping 0 0 0  Tired, decreased energy 0 0 0  Change in appetite 0 0 0  Feeling bad or failure about yourself  0 0 0  Trouble concentrating 0 0 0  Moving slowly or fidgety/restless 0 0 0  Suicidal thoughts 0 0 0  PHQ-9 Score 0 0 0  Difficult doing work/chores Not difficult at all Not difficult at all Not difficult at all      04/04/2023    1:54 PM 11/30/2022    7:54 AM 06/01/2022    8:05 AM 05/24/2022    8:12 AM  GAD 7 : Generalized Anxiety Score  Nervous, Anxious, on Edge 0 0 0 0  Control/stop worrying 0 0 0 0  Worry too much - different things 0 0 0 0  Trouble relaxing 0 0 0 0  Restless 0 0 0 0  Easily annoyed or irritable 0 0 0 0  Afraid - awful might happen 0 0 0 0  Total GAD 7 Score 0 0 0 0  Anxiety Difficulty Not difficult at all Not difficult at  all Not difficult at all Not difficult at all     Assessment/ Plan: Danella Dunn here for annual physical exam.   Essential hypertension  Stage 3b chronic kidney disease (HCC)  Right ovarian epithelial cancer (HCC)  History of reversal of ileostomy  Osteopenia with high risk of fracture  Pure  hypercholesterolemia  Postoperative hypothyroidism  ***  Counseled on healthy lifestyle choices, including diet (rich in fruits, vegetables and lean meats and low in salt and simple carbohydrates) and exercise (at least 30 minutes of moderate physical activity daily).  Patient to follow up ***  Josefa Syracuse M. Bonnell Butcher, DO

## 2023-10-14 ENCOUNTER — Encounter: Payer: Medicare Other | Admitting: Family Medicine

## 2023-10-14 DIAGNOSIS — M8580 Other specified disorders of bone density and structure, unspecified site: Secondary | ICD-10-CM

## 2023-10-14 DIAGNOSIS — I10 Essential (primary) hypertension: Secondary | ICD-10-CM

## 2023-10-14 DIAGNOSIS — E7800 Pure hypercholesterolemia, unspecified: Secondary | ICD-10-CM

## 2023-10-14 DIAGNOSIS — E890 Postprocedural hypothyroidism: Secondary | ICD-10-CM

## 2023-10-14 DIAGNOSIS — N1832 Chronic kidney disease, stage 3b: Secondary | ICD-10-CM

## 2023-10-14 DIAGNOSIS — Z9889 Other specified postprocedural states: Secondary | ICD-10-CM

## 2023-10-14 DIAGNOSIS — C561 Malignant neoplasm of right ovary: Secondary | ICD-10-CM

## 2023-10-18 ENCOUNTER — Ambulatory Visit (INDEPENDENT_AMBULATORY_CARE_PROVIDER_SITE_OTHER): Admitting: Family Medicine

## 2023-10-18 ENCOUNTER — Encounter: Payer: Self-pay | Admitting: Family Medicine

## 2023-10-18 VITALS — BP 134/75 | HR 69 | Ht 67.0 in | Wt 151.0 lb

## 2023-10-18 DIAGNOSIS — N1832 Chronic kidney disease, stage 3b: Secondary | ICD-10-CM | POA: Diagnosis not present

## 2023-10-18 DIAGNOSIS — E890 Postprocedural hypothyroidism: Secondary | ICD-10-CM

## 2023-10-18 DIAGNOSIS — I10 Essential (primary) hypertension: Secondary | ICD-10-CM | POA: Diagnosis not present

## 2023-10-18 DIAGNOSIS — E7800 Pure hypercholesterolemia, unspecified: Secondary | ICD-10-CM

## 2023-10-18 DIAGNOSIS — Z0000 Encounter for general adult medical examination without abnormal findings: Secondary | ICD-10-CM

## 2023-10-18 MED ORDER — LEVOTHYROXINE SODIUM 100 MCG PO TABS: 100.0000 ug | ORAL_TABLET | Freq: Every morning | ORAL | 3 refills | Status: DC

## 2023-10-18 MED ORDER — SIMVASTATIN 20 MG PO TABS: 20.0000 mg | ORAL_TABLET | Freq: Every day | ORAL | 3 refills | Status: DC

## 2023-10-18 MED ORDER — ATENOLOL 25 MG PO TABS: 12.5000 mg | ORAL_TABLET | Freq: Two times a day (BID) | ORAL | 3 refills | Status: DC

## 2023-10-18 MED ORDER — AMLODIPINE BESYLATE 5 MG PO TABS: 5.0000 mg | ORAL_TABLET | Freq: Every day | ORAL | 3 refills | Status: DC

## 2023-10-18 NOTE — Progress Notes (Signed)
 April Weber is a 78 y.o. female presents to office today for annual physical exam examination.    Concerns today include: 1.  None.  She reports that she has had some intermittent breakthrough allergy symptoms but this is relatively stable with use of Claritin  and nasal spray.  She does report some ongoing fecal incontinence and has an appointment with GI to discuss next steps soon.  Sadly this has been impacting her quality of life quite a bit lately.  Occupation: Retired, Marital status: Single, Substance use: None Health Maintenance Due  Topic Date Due   Zoster Vaccines- Shingrix (1 of 2) 10/14/1964   MAMMOGRAM  10/12/2022   COVID-19 Vaccine (8 - 2024-25 season) 01/09/2023   Refills needed today: All  Immunization History  Administered Date(s) Administered   Fluad Quad(high Dose 65+) 02/09/2019, 02/13/2020, 02/18/2021, 02/23/2022   Influenza Whole 02/16/2010   Influenza, High Dose Seasonal PF 02/12/2015, 03/05/2016, 03/01/2017, 02/17/2018   Influenza,inj,Quad PF,6+ Mos 02/13/2013, 02/13/2014   Moderna Sars-Covid-2 Vaccination 06/15/2019, 07/14/2019, 03/11/2020, 08/19/2020, 01/27/2021   Pfizer(Comirnaty)Fall Seasonal Vaccine 12 years and older 02/16/2022   Pneumococcal Conjugate-13 08/14/2014   Pneumococcal Polysaccharide-23 10/24/2009   Td 01/09/2019, 01/09/2019   Tdap 08/28/2008   Unspecified SARS-COV-2 Vaccination 08/19/2020   Zoster, Live 03/23/2007   Past Medical History:  Diagnosis Date   Acute diverticulitis 06/13/2022   Arthritis    Breast cancer (HCC) 1980   RIGHT   Chronic kidney disease    has one kidney   Diverticulitis of colon    DVT (deep venous thrombosis) (HCC)    on RIGHT leg-hx of, superficial   Dyspnea    Headache    occ migraine   Hyperlipidemia    on meds   Hypertension    on meds   Irritant contact dermatitis associated with fecal stoma 09/28/2022   Neutropenic fever (HCC) 06/15/2022   Osteopenia    Ovarian cancer (HCC) 03/2022    Peripheral vascular disease (HCC)    PONV (postoperative nausea and vomiting)    Seasonal allergies    Thyroid  disease    on meds   Social History   Socioeconomic History   Marital status: Single    Spouse name: Divorced    Number of children: 1   Years of education: Not on file   Highest education level: Not on file  Occupational History   Occupation: 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  Tobacco Use   Smoking status: Former    Current packs/day: 0.00    Types: Cigarettes    Quit date: 05/11/1967    Years since quitting: 56.4    Passive exposure: Past   Smokeless tobacco: Never  Vaping Use   Vaping status: Never Used  Substance and Sexual Activity   Alcohol use: No   Drug use: No   Sexual activity: Not Currently    Birth control/protection: Post-menopausal, Surgical    Comment: hyst  Other Topics Concern   Not on file  Social History Narrative   Lives alone - very close with her neighbors   Social Drivers of Corporate investment banker Strain: Low Risk  (01/25/2023)   Overall Financial Resource Strain (CARDIA)    Difficulty of Paying Living Expenses: Not hard at all  Food Insecurity: No Food Insecurity (01/25/2023)   Hunger Vital Sign    Worried About Radiation protection practitioner of Food in the Last Year: Never true    Ran Out of Food in  the Last Year: Never true  Transportation Needs: No Transportation Needs (01/25/2023)   PRAPARE - Administrator, Civil Service (Medical): No    Lack of Transportation (Non-Medical): No  Physical Activity: Insufficiently Active (01/25/2023)   Exercise Vital Sign    Days of Exercise per Week: 3 days    Minutes of Exercise per Session: 30 min  Stress: No Stress Concern Present (01/25/2023)   [employer] of Occupational Health - Occupational Stress Questionnaire    Feeling of Stress : Not at all  Social Connections: Unknown (01/25/2023)   Social Connection and Isolation Panel [NHANES]     Frequency of Communication with Friends and Family: More than three times a week    Frequency of Social Gatherings with Friends and Family: More than three times a week    Attends Religious Services: More than 4 times per year    Active Member of Clubs or Organizations: Yes    Attends Banker Meetings: More than 4 times per year    Marital Status: Patient declined  Intimate Partner Violence: Not At Risk (01/25/2023)   Humiliation, Afraid, Rape, and Kick questionnaire    Fear of Current or Ex-Partner: No    Emotionally Abused: No    Physically Abused: No    Sexually Abused: No   Past Surgical History:  Procedure Laterality Date   ABDOMINAL HYSTERECTOMY     APPENDECTOMY     BLADDER REPAIR  09/01/2022   Procedure: REPAIR BLADDER FISTULA WITH HYDRODISTENTION WITH METHYLINE BLUE;  Surgeon: Florencio Hunting, MD;  Location: [facility];  Service: Urology;;   BREAST BIOPSY     BREAST LUMPECTOMY WITH RADIOACTIVE SEED LOCALIZATION Left 12/01/2021   Procedure: LEFT BREAST RADIOACTIVE SEED GUIDED LUMPECTOMY;  Surgeon: Oza Blumenthal, MD;  Location: [facility];  Service: General;  Laterality: Left;  LMA   COLON RESECTION N/A 09/01/2022   Procedure: LAPAROSCOPIC SIGMOIDECTOMY CONVERTED TO OPEN, TAKEDOWN OF COLOVESICAL FISTULA AND LOOP COLOSTOMY;  Surgeon: [name], Alphonso Aschoff, MD;  Location: [facility];  Service: General;  Laterality: N/A;   COLONOSCOPY  2011   [email or web address]   CYSTOSCOPY WITH STENT PLACEMENT  09/01/2022   Procedure: CYSTOSCOPY WITH BILATERAL URETERAL STENT PLACEMENTS;  Surgeon: Florencio Hunting, MD;  Location: [facility];  Service: Urology;;   DEBULKING N/A 03/30/2022   Procedure: TUMOR DEBULKING;  Surgeon: Suzi Essex, MD;  Location: [facility];  Service: Gynecology;  Laterality: N/A;   EYE SURGERY     78 years old   ILEOSTOMY CLOSURE N/A 01/11/2023   Procedure: LOOP ILEOSTOMY REVERSAL;  Surgeon: Dorrie Gaudier Alphonso Aschoff, MD;  Location: [facility];  Service: General;   Laterality: N/A;   IR IMAGING GUIDED PORT INSERTION  03/19/2022   IR REMOVAL TUN ACCESS W/ PORT W/O FL MOD SED  11/15/2022   LAPAROSCOPY N/A 03/30/2022   Procedure: LAPAROSCOPY DIAGNOSTIC;  Surgeon: Suzi Essex, MD;  Location: [facility];  Service: Gynecology;  Laterality: N/A;   MASTECTOMY Right 1984   ROTATOR CUFF REPAIR     SALPINGOOPHORECTOMY Bilateral 03/30/2022   Procedure: OPEN SALPINGO OOPHORECTOMY;  Surgeon: Suzi Essex, MD;  Location: [facility];  Service: Gynecology;  Laterality: Bilateral;   THYROID  SURGERY Bilateral 1967   VARICOSE VEIN SURGERY     ablation   WISDOM TOOTH EXTRACTION     Family History  Problem Relation Age of Onset   Heart attack Mother    Alcohol abuse Mother    Lung cancer Mother  d. mid 29s   Breast cancer Maternal Aunt 75   Colon polyps Neg Hx    Colon cancer Neg Hx    Esophageal cancer Neg Hx    Stomach cancer Neg Hx    Rectal cancer Neg Hx     Current Outpatient Medications:    acetaminophen  (TYLENOL ) 500 MG tablet, Take 2 tablets (1,000 mg total) by mouth every 6 (six) hours as needed for mild pain., Disp: 30 tablet, Rfl: 0   amLODipine  (NORVASC ) 5 MG tablet, Take 1 tablet (5 mg total) by mouth daily., Disp: 90 tablet, Rfl: 3   aspirin  EC 81 MG tablet, Take 81 mg by mouth at bedtime. Swallow whole., Disp: , Rfl:    atenolol  (TENORMIN ) 25 MG tablet, Take 0.5 tablets (12.5 mg total) by mouth 2 (two) times daily., Disp: 90 tablet, Rfl: 3   Calcium  Carb-Cholecalciferol  (CALCIUM  + VITAMIN D3 PO), Take 1 tablet by mouth 2 (two) times daily., Disp: , Rfl:    EPINEPHrine  0.3 mg/0.3 mL IJ SOAJ injection, Inject 0.3 mg into the muscle as needed for anaphylaxis. (Patient not taking: Reported on 08/31/2023), Disp: 2 each, Rfl: 2   levothyroxine  (SYNTHROID ) 100 MCG tablet, Take 1 tablet (100 mcg total) by mouth in the morning., Disp: 90 tablet, Rfl: 3   Multiple Vitamin (MULTIVITAMIN) tablet, Take 1 tablet by mouth in the morning., Disp: , Rfl:     olaparib  (LYNPARZA ) 100 MG tablet, Take 1 tablet (100 mg total) by mouth 2 (two) times daily. Swallow whole. May take with food to decrease nausea and vomiting., Disp: 60 tablet, Rfl: 11   ondansetron  (ZOFRAN ) 4 MG tablet, Take 1 tablet (4 mg total) by mouth every 6 (six) hours as needed for nausea. (Patient not taking: Reported on 08/31/2023), Disp: 20 tablet, Rfl: 0   simvastatin  (ZOCOR ) 20 MG tablet, Take 1 tablet (20 mg total) by mouth daily., Disp: 90 tablet, Rfl: 3  Allergies  Allergen Reactions   Paclitaxel  Anaphylaxis   Emend [Fosaprepitant  Dimeglumine] Other (See Comments)    3 minutes into emend infusion patient began having facial flushing and redness to bilateral hands and neck, and light headedness   Codeine Nausea And Vomiting   Nsaids Other (See Comments)    Told to avoid due to kidney function   Penicillins Hives   Vibra-Tab [Doxycycline] Hives   Iodine  Rash   Latex Rash    Occasionally gets a localized rash on contact with certain latex products     ROS: Review of Systems Pertinent items noted in HPI and remainder of comprehensive ROS otherwise negative.    Physical exam BP 134/75   Pulse 69   Ht 5\' 7"  (1.702 m)   Wt 151 lb (68.5 kg)   SpO2 98%   BMI 23.65 kg/m  General appearance: alert, cooperative, appears stated age, and no distress Head: Normocephalic, without obvious abnormality, atraumatic Eyes: negative findings: lids and lashes normal, conjunctivae and sclerae normal, corneas clear, and pupils equal, round, reactive to light and accomodation Ears: normal TM's and external ear canals both ears Nose: Nares normal. Septum midline. Mucosa normal. No drainage or sinus tenderness. Throat: lips, mucosa, and tongue normal; teeth and gums normal Neck: no adenopathy, no carotid bruit, supple, symmetrical, trachea midline, and thyroid  not enlarged, symmetric, no tenderness/mass/nodules Back: symmetric, no curvature. ROM normal. No CVA tenderness. Lungs: clear  to auscultation bilaterally Breasts: Surgically absent right breast Heart: regular rate and rhythm, S1, S2 normal, no murmur, click, rub or gallop Abdomen: soft,  non-tender; bowel sounds normal; no masses,  no organomegaly Extremities: extremities normal, atraumatic, no cyanosis or edema Pulses: 2+ and symmetric Skin: Several flesh-colored lesions that appear consistent with seborrheic keratosis along the extremity Lymph nodes: Cervical, supraclavicular, and axillary nodes normal. Neurologic: Grossly normal      10/18/2023    1:57 PM 04/04/2023    1:54 PM 01/25/2023    9:02 AM  Depression screen PHQ 2/9  Decreased Interest 0 0 0  Down, Depressed, Hopeless 0 0 0  PHQ - 2 Score 0 0 0  Altered sleeping 0 0 0  Tired, decreased energy 0 0 0  Change in appetite 0 0 0  Feeling bad or failure about yourself  0 0 0  Trouble concentrating 0 0 0  Moving slowly or fidgety/restless 0 0 0  Suicidal thoughts 0 0 0  PHQ-9 Score 0 0 0  Difficult doing work/chores Not difficult at all Not difficult at all Not difficult at all      10/18/2023    1:57 PM 04/04/2023    1:54 PM 11/30/2022    7:54 AM 06/01/2022    8:05 AM  GAD 7 : Generalized Anxiety Score  Nervous, Anxious, on Edge 0 0 0 0  Control/stop worrying 0 0 0 0  Worry too much - different things 0 0 0 0  Trouble relaxing 0 0 0 0  Restless 0 0 0 0  Easily annoyed or irritable 0 0 0 0  Afraid - awful might happen 0 0 0 0  Total GAD 7 Score 0 0 0 0  Anxiety Difficulty Not difficult at all Not difficult at all Not difficult at all Not difficult at all   Recent Results (from the past 2160 hours)  CA 125     Status: None   Collection Time: 09/15/23  9:53 AM  Result Value Ref Range   Cancer Antigen (CA) 125 10.9 0.0 - 38.1 U/mL    Comment: (NOTE) Roche Diagnostics Electrochemiluminescence Immunoassay (ECLIA) Values obtained with different assay methods or kits cannot be used interchangeably.  Results cannot be interpreted as  absolute evidence of the presence or absence of malignant disease. Performed At: [facility] [address] [place], [place] [id number] Pearlean Botts MD WU:[id number]   Comprehensive metabolic panel with GFR     Status: Abnormal   Collection Time: 09/15/23  9:53 AM  Result Value Ref Range   Sodium 136 135 - 145 mmol/L   Potassium 3.8 3.5 - 5.1 mmol/L   Chloride 99 98 - 111 mmol/L   CO2 27 22 - 32 mmol/L   Glucose, Bld 83 70 - 99 mg/dL    Comment: Glucose reference range applies only to samples taken after fasting for at least 8 hours.   BUN 21 8 - 23 mg/dL   Creatinine, Ser 9.56 (H) 0.44 - 1.00 mg/dL   Calcium  9.3 8.9 - 10.3 mg/dL   Total Protein 6.8 6.5 - 8.1 g/dL   Albumin  4.6 3.5 - 5.0 g/dL   AST 21 15 - 41 U/L   ALT 16 0 - 44 U/L   Alkaline Phosphatase 60 38 - 126 U/L   Total Bilirubin 0.6 0.0 - 1.2 mg/dL   GFR, Estimated 36 (L) >60 mL/min    Comment: (NOTE) Calculated using the CKD-EPI Creatinine Equation (2021)    Anion gap 10 5 - 15    Comment: Performed at [facility], [address], [place], [place] [address]  CBC with Differential/Platelet  Status: None   Collection Time: 09/15/23  9:53 AM  Result Value Ref Range   WBC 7.4 4.0 - 10.5 K/uL   RBC 3.89 3.87 - 5.11 MIL/uL   Hemoglobin 13.1 12.0 - 15.0 g/dL   HCT 16.1 09.6 - 04.5 %   MCV 95.4 80.0 - 100.0 fL   MCH 33.7 26.0 - 34.0 pg   MCHC 35.3 30.0 - 36.0 g/dL   RDW 40.9 81.1 - 91.4 %   Platelets 236 150 - 400 K/uL   nRBC 0.0 0.0 - 0.2 %   Neutrophils Relative % 73 %   Neutro Abs 5.4 1.7 - 7.7 K/uL   Lymphocytes Relative 14 %   Lymphs Abs 1.1 0.7 - 4.0 K/uL   Monocytes Relative 9 %   Monocytes Absolute 0.6 0.1 - 1.0 K/uL   Eosinophils Relative 3 %   Eosinophils Absolute 0.2 0.0 - 0.5 K/uL   Basophils Relative 1 %   Basophils Absolute 0.1 0.0 - 0.1 K/uL   Immature Granulocytes 0 %   Abs Immature Granulocytes 0.03 0.00 - 0.07 K/uL    Comment: Performed at [facility], [address], [place], [place] [address]  TSH     Status: None   Collection Time: 10/10/23  8:55 AM  Result Value Ref Range   TSH 1.510 0.450 - 4.500 uIU/mL  T4, free     Status: None   Collection Time: 10/10/23  8:55 AM  Result Value Ref Range   Free T4 1.54 0.82 - 1.77 ng/dL  [id number]     Status: Abnormal   Collection Time: 10/10/23  8:55 AM  Result Value Ref Range   Glucose 101 (H) 70 - 99 mg/dL   BUN 20 8 - 27 mg/dL   Creatinine, Ser 8.46 (H) 0.57 - 1.00 mg/dL   eGFR 38 (L) >96 EX/BMW/4.13   BUN/Creatinine Ratio 14 12 - 28   Sodium 139 134 - 144 mmol/L   Potassium 4.5 3.5 - 5.2 mmol/L   Chloride 101 96 - 106 mmol/L   CO2 19 (L) 20 - 29 mmol/L   Calcium  9.6 8.7 - 10.3 mg/dL   Total Protein 6.7 6.0 - 8.5 g/dL   Albumin  4.4 3.8 - 4.8 g/dL   Globulin, Total 2.3 1.5 - 4.5 g/dL   Bilirubin Total 0.5 0.0 - 1.2 mg/dL   Alkaline Phosphatase 67 44 - 121 IU/L   AST 22 0 - 40 IU/L   ALT 16 0 - 32 IU/L                Assessment/ Plan: Danella Dunn here for annual physical exam.   Annual physical exam  Essential hypertension - Plan: amLODipine  (NORVASC ) 5 MG tablet, atenolol  (TENORMIN ) 25 MG tablet  Stage 3b chronic kidney disease (HCC) - Plan: CANCELED: VITAMIN D  25 Hydroxy (Vit-D Deficiency, Fractures)  Pure hypercholesterolemia - Plan: Lipid Panel  Postoperative hypothyroidism - Plan: levothyroxine  (SYNTHROID ) 100 MCG tablet  She has declined shingles vaccination.  Her mammogram has been requested.  We reviewed lab results.  I have placed a hard copy upfront for her to collect when she comes in for lipid panel  I have canceled her vitamin D  testing and have scanned in her most recent lab work as outlined by her nephrologist  She will continue all medications as prescribed and refills have been sent appropriately  Counseled on healthy lifestyle choices, including diet (rich in fruits, vegetables and lean meats and low in salt  and simple carbohydrates) and exercise (at least 30 minutes of moderate physical activity daily).  Patient to follow up 6-12 m  Clothilde Tippetts M. Bonnell Butcher, DO

## 2023-10-19 ENCOUNTER — Ambulatory Visit
Admission: RE | Admit: 2023-10-19 | Discharge: 2023-10-19 | Disposition: A | Discharge status: Home / Self Care | Source: Ambulatory Visit | Attending: Gastroenterology | Admitting: Gastroenterology

## 2023-10-19 ENCOUNTER — Ambulatory Visit: Admitting: Gastroenterology

## 2023-10-19 ENCOUNTER — Telehealth: Payer: Self-pay | Admitting: Gastroenterology

## 2023-10-19 ENCOUNTER — Encounter: Payer: Self-pay | Admitting: Gastroenterology

## 2023-10-19 VITALS — BP 132/70 | HR 60 | Ht 67.0 in | Wt 152.0 lb

## 2023-10-19 DIAGNOSIS — K5909 Other constipation: Secondary | ICD-10-CM | POA: Diagnosis not present

## 2023-10-19 DIAGNOSIS — N39490 Overflow incontinence: Secondary | ICD-10-CM

## 2023-10-19 DIAGNOSIS — Z8719 Personal history of other diseases of the digestive system: Secondary | ICD-10-CM

## 2023-10-19 DIAGNOSIS — Z87891 Personal history of nicotine dependence: Secondary | ICD-10-CM

## 2023-10-19 DIAGNOSIS — Z8543 Personal history of malignant neoplasm of ovary: Secondary | ICD-10-CM

## 2023-10-19 NOTE — Progress Notes (Signed)
 Chief Complaint: FU  Referring Provider:  Eliodoro Guerin, DO      ASSESSMENT AND PLAN;   #1. Chronic constipation with overflow incontinence  #2. History of diverticulitis S/p sigmoid colectomy with loop ileostomy 08/2022 and takedown 01/2023  #3. H/O ovarian cancer in 2023, status post surgery and completion adjuvant treatment in April 2024   Plan: -Continue current high fiber diet -X ray KUB 2 View -No need to rpt colon -Call if still with problems. She is schedule have CT AP in sept 2025 (FU ovarian Ca) -FU in 6 months.   HPI:    April Weber is a 78 y.o. female  With history of diverticulitis s/p sigmoid colectomy with loop ileostomy 08/2022 and takedown 01/2023, Stage IIB ovarian cancer s/p R salpingo-oophorectomy, peritoneal biopsies, infracolic omentectomy 11/ 2023, breast cancer (1980) personal history of colon polyps no longer on surveillance, CKD, HTN, HLD, who presents today for a complaint of fecal incontinence.   Discussed the use of AI scribe software for clinical note transcription with the patient, who gave verbal consent to proceed.  History of Present Illness April Weber is a 78 year old female with a history of partial colon resection who presents with bowel movement irregularities and concerns about stool consistency.  She experiences irregular bowel movements, characterized by frequent, small episodes throughout the day, often up to six times. These episodes are inconvenient and messy, impacting her ability to engage in activities such as attending church. She describes her bowel movements as 'little sliders' and 'squishies', with a 'pudding' consistency, which she sometimes discovers unexpectedly when using the bathroom for urination.  She underwent a cleanse recently, which she feels has improved her condition, but she still experiences issues with stool consistency. She avoids certain foods like corn, which exacerbate her symptoms, and  uses dietary adjustments, such as eating cantaloupe, to manage her bowel movements. She drinks at least 60 ounces of water daily to support her kidney health and avoids foods that could worsen her symptoms, such as beef and pork roast. She uses high-fiber foods to help regulate her bowel movements and avoids MiraLAX  due to its delayed effect and inconvenience.  She has a history of partial colon resection, which she believes contributes to her current bowel issues. She is currently taking Lynparza , which may have side effects impacting her bowel habits. She has been on this medication for nearly a year and is scheduled for a CT scan in September to evaluate its effects. She also takes amlodipine  5 mg daily for blood pressure management.  No pain, fever, or use of pain medication. She experiences frequent, small bowel movements and occasional 'squishies' with pudding-like consistency.    Past GI [facility] Colonoscopy 12/08/2020 (Dr. Savannah Curlin) - Non- bleeding internal hemorrhoids. - Severe diverticulosis.  - One 1 mm polyp at the hepatic flexure, removed with a cold snare. Resected and retrieved.  - The examination was otherwise normal on direct and retroflexion views.   Diagnosis Surgical [P], colon, hepatic flexure, polyp (1) - TUBULAR ADENOMA.  CT A/P 03/18/2023 IMPRESSION: 1. Interval left hemicolectomy with rectosigmoid anastomosis. 2. 11 mm subcutaneous nodule anterior right abdominal wall is new in the interval. This is nonspecific and may be related to recent injection. Close follow-up recommended. 3. No findings of recurrent or metastatic disease on the current exam. 4.  Aortic Atherosclerosis (ICD10-I70.0).   Most recent CT A/P 07/21/2023 for ovarian cancer restaging IMPRESSION: 1. No acute process or evidence of metastatic disease  in the abdomen or pelvis. The anterior right pelvic wall nodule on the prior exam is resolved. 2. Marked left renal atrophy.  Possible left  nephrolithiasis. 3. Apparent gastric antral wall thickening could be due to underdistention or gastritis. 4.  Aortic Atherosclerosis (ICD10-I70.0) Past Medical History:  Diagnosis Date   Acute diverticulitis 06/13/2022   Arthritis    Breast cancer (HCC) 1980   RIGHT   Chronic kidney disease    has one kidney   Diverticulitis of colon    DVT (deep venous thrombosis) (HCC)    on RIGHT leg-hx of, superficial   Dyspnea    Headache    occ migraine   Hyperlipidemia    on meds   Hypertension    on meds   Irritant contact dermatitis associated with fecal stoma 09/28/2022   Neutropenic fever (HCC) 06/15/2022   Osteopenia    Ovarian cancer (HCC) 03/2022   Peripheral vascular disease (HCC)    PONV (postoperative nausea and vomiting)    Seasonal allergies    Thyroid  disease    on meds    Past Surgical History:  Procedure Laterality Date   ABDOMINAL HYSTERECTOMY     APPENDECTOMY     BLADDER REPAIR  09/01/2022   Procedure: REPAIR BLADDER FISTULA WITH HYDRODISTENTION WITH METHYLINE BLUE;  Surgeon: Florencio Hunting, MD;  Location: [facility];  Service: Urology;;   BREAST BIOPSY     BREAST LUMPECTOMY WITH RADIOACTIVE SEED LOCALIZATION Left 12/01/2021   Procedure: LEFT BREAST RADIOACTIVE SEED GUIDED LUMPECTOMY;  Surgeon: Oza Blumenthal, MD;  Location: [facility];  Service: General;  Laterality: Left;  LMA   COLON RESECTION N/A 09/01/2022   Procedure: LAPAROSCOPIC SIGMOIDECTOMY CONVERTED TO OPEN, TAKEDOWN OF COLOVESICAL FISTULA AND LOOP COLOSTOMY;  Surgeon: Dorrie Gaudier Alphonso Aschoff, MD;  Location: [facility];  Service: General;  Laterality: N/A;   COLONOSCOPY  2011   Dr.[email or web address]-normal   CYSTOSCOPY WITH STENT PLACEMENT  09/01/2022   Procedure: CYSTOSCOPY WITH BILATERAL URETERAL STENT PLACEMENTS;  Surgeon: Florencio Hunting, MD;  Location: [facility];  Service: Urology;;   DEBULKING N/A 03/30/2022   Procedure: TUMOR DEBULKING;  Surgeon: Suzi Essex, MD;  Location: [facility];  Service:  Gynecology;  Laterality: N/A;   EYE SURGERY     78 years old   ILEOSTOMY CLOSURE N/A 01/11/2023   Procedure: LOOP ILEOSTOMY REVERSAL;  Surgeon: Dorrie Gaudier Alphonso Aschoff, MD;  Location: [facility];  Service: General;  Laterality: N/A;   IR IMAGING GUIDED PORT INSERTION  03/19/2022   IR REMOVAL TUN ACCESS W/ PORT W/O FL MOD SED  11/15/2022   LAPAROSCOPY N/A 03/30/2022   Procedure: LAPAROSCOPY DIAGNOSTIC;  Surgeon: Suzi Essex, MD;  Location: [facility];  Service: Gynecology;  Laterality: N/A;   MASTECTOMY Right 1984   ROTATOR CUFF REPAIR     SALPINGOOPHORECTOMY Bilateral 03/30/2022   Procedure: OPEN SALPINGO OOPHORECTOMY;  Surgeon: Suzi Essex, MD;  Location: [facility];  Service: Gynecology;  Laterality: Bilateral;   THYROID  SURGERY Bilateral 1967   VARICOSE VEIN SURGERY     ablation   WISDOM TOOTH EXTRACTION      Family History  Problem Relation Age of Onset   Heart attack Mother    Alcohol abuse Mother    Lung cancer Mother        d. mid 48s   Breast cancer Maternal Aunt 36   Colon polyps Neg Hx    Colon cancer Neg Hx    Esophageal cancer Neg Hx    Stomach cancer Neg  Hx    Rectal cancer Neg Hx     Social History   Tobacco Use   Smoking status: Former    Current packs/day: 0.00    Types: Cigarettes    Quit date: 05/11/1967    Years since quitting: 56.4    Passive exposure: Past   Smokeless tobacco: Never  Vaping Use   Vaping status: Never Used  Substance Use Topics   Alcohol use: No   Drug use: No    Current Outpatient Medications  Medication Sig Dispense Refill   amLODipine  (NORVASC ) 5 MG tablet Take 1 tablet (5 mg total) by mouth daily. 90 tablet 3   aspirin  EC 81 MG tablet Take 81 mg by mouth at bedtime. Swallow whole.     atenolol  (TENORMIN ) 25 MG tablet Take 0.5 tablets (12.5 mg total) by mouth 2 (two) times daily. 90 tablet 3   Calcium  Carb-Cholecalciferol  (CALCIUM  + VITAMIN D3 Weber) Take 1 tablet by mouth 2 (two) times daily.     levothyroxine  (SYNTHROID ) 100  MCG tablet Take 1 tablet (100 mcg total) by mouth in the morning. 90 tablet 3   Multiple Vitamin (MULTIVITAMIN) tablet Take 1 tablet by mouth in the morning.     olaparib  (LYNPARZA ) 100 MG tablet Take 1 tablet (100 mg total) by mouth 2 (two) times daily. Swallow whole. May take with food to decrease nausea and vomiting. 60 tablet 11   simvastatin  (ZOCOR ) 20 MG tablet Take 1 tablet (20 mg total) by mouth daily. 90 tablet 3   acetaminophen  (TYLENOL ) 500 MG tablet Take 2 tablets (1,000 mg total) by mouth every 6 (six) hours as needed for mild pain. (Patient not taking: Reported on 10/19/2023) 30 tablet 0   No current facility-administered medications for this visit.    Allergies  Allergen Reactions   Paclitaxel  Anaphylaxis   Emend [Fosaprepitant  Dimeglumine] Other (See Comments)    3 minutes into emend infusion patient began having facial flushing and redness to bilateral hands and neck, and light headedness   Codeine Nausea And Vomiting   Nsaids Other (See Comments)    Told to avoid due to kidney function   Penicillins Hives   Vibra-Tab [Doxycycline] Hives   Iodine  Rash   Latex Rash    Occasionally gets a localized rash on contact with certain latex products    Review of Systems:  neg     Physical Exam:    BP 132/70   Pulse 60   Ht 5' 7 (1.702 m)   Wt 152 lb (68.9 kg)   BMI 23.81 kg/m  Wt Readings from Last 3 Encounters:  10/19/23 152 lb (68.9 kg)  10/18/23 151 lb (68.5 kg)  09/15/23 151 lb (68.5 kg)   Constitutional:  Well-developed, in no acute distress. Psychiatric: Normal mood and affect. Behavior is normal. HEENT: Pupils normal.  Conjunctivae are normal. No scleral icterus. Neck supple.  Cardiovascular: Normal rate, regular rhythm. No edema Pulmonary/chest: Effort normal and breath sounds normal. No wheezing, rales or rhonchi. Abdominal: Soft, nondistended. Nontender. Bowel sounds active throughout. There are no masses palpable. No hepatomegaly. Rectal:  Deferred Neurological: Alert and oriented to person place and time. Skin: Skin is warm and dry. No rashes noted.  Data Reviewed: I have personally reviewed following labs and imaging studies  CBC:    Latest Ref Rng & Units 09/15/2023    9:53 AM 07/14/2023    9:47 AM 05/19/2023    8:58 AM  CBC  WBC 4.0 - 10.5 K/uL 7.4  6.9  6.6   Hemoglobin 12.0 - 15.0 g/dL 40.9  81.1  91.4   Hematocrit 36.0 - 46.0 % 37.1  34.6  35.9   Platelets 150 - 400 K/uL 236  216  265     CMP:    Latest Ref Rng & Units 10/10/2023    8:55 AM 09/15/2023    9:53 AM 07/14/2023    9:47 AM  CMP  Glucose 70 - 99 mg/dL 782  83  956   BUN 8 - 27 mg/dL 20  21  18    Creatinine 0.57 - 1.00 mg/dL 2.13  0.86  5.78   Sodium 134 - 144 mmol/L 139  136  137   Potassium 3.5 - 5.2 mmol/L 4.5  3.8  4.1   Chloride 96 - 106 mmol/L 101  99  103   CO2 20 - 29 mmol/L 19  27  27    Calcium  8.7 - 10.3 mg/dL 9.6  9.3  9.3   Total Protein 6.0 - 8.5 g/dL 6.7  6.8  6.5   Total Bilirubin 0.0 - 1.2 mg/dL 0.5  0.6  0.5   Alkaline Phos 44 - 121 IU/L 67  60  56   AST 0 - 40 IU/L 22  21  18    ALT 0 - 32 IU/L 16  16  13         Radiology Studies: DG Abd 2 Views Result Date: 09/24/2023 CLINICAL DATA:  Constipation EXAM: ABDOMEN - 2 VIEW COMPARISON:  August 31, 2023 FINDINGS: Air and stool-filled nondilated loops of bowel. Mild to moderate colonic stool burden diffusely throughout the colon. Visualized lung bases are unremarkable. Degenerative changes of the lumbar spine. IMPRESSION: Nonobstructive bowel gas pattern. Electronically Signed   By: Clancy Crimes M.D.   On: 09/24/2023 20:46      Magnus Schuller, MD 10/19/2023, 9:17 AM  Cc: Eliodoro Guerin, DO

## 2023-10-19 NOTE — Telephone Encounter (Signed)
 Inbound call from patient would like to speak to a nurse I regards to her incontinence she states that Dr.Gupta advised it was due to her surgery. Patient would like to know if this is something that can be treated or or if it is something that she will have to manage for the rest of her life. Please advise

## 2023-10-19 NOTE — Patient Instructions (Addendum)
 _______________________________________________________  If your blood pressure at your visit was 140/90 or greater, please contact your primary care physician to follow up on this.  _______________________________________________________  If you are age 79 or older, your body mass index should be between 23-30. Your Body mass index is 23.81 kg/m. If this is out of the aforementioned range listed, please consider follow up with your Primary Care Provider.  If you are age 35 or younger, your body mass index should be between 19-25. Your Body mass index is 23.81 kg/m. If this is out of the aformentioned range listed, please consider follow up with your Primary Care Provider.   ________________________________________________________  The [facility] providers would like to encourage you to use MYCHART to communicate with providers for non-urgent requests or questions.  Due to long hold times on the telephone, sending your provider a message by [facility] may be a faster and more efficient way to get a response.  Please allow 48 business hours for a response.  Please remember that this is for non-urgent requests.  _______________________________________________________  Your provider has requested that you have an abdominal x ray before leaving today. Please go to the basement floor to our Radiology department for the test.  Continue high fiber diet  Please follow up in 6 months. Give us  a call at [phone] to schedule an appointment.  Thank you,  Dr. Lajuan Pila

## 2023-10-20 ENCOUNTER — Other Ambulatory Visit: Payer: Self-pay

## 2023-10-20 ENCOUNTER — Other Ambulatory Visit

## 2023-10-20 DIAGNOSIS — E7800 Pure hypercholesterolemia, unspecified: Secondary | ICD-10-CM

## 2023-10-20 LAB — LIPID PANEL
Chol/HDL Ratio: 2.5 ratio (ref 0.0–4.4)
Cholesterol, Total: 160 mg/dL (ref 100–199)
HDL: 64 mg/dL (ref >39)
LDL Chol Calc (NIH): 78 mg/dL (ref 0–99)
Triglycerides: 99 mg/dL (ref 0–149)
VLDL Cholesterol Cal: 18 mg/dL (ref 5–40)

## 2023-10-20 NOTE — Telephone Encounter (Signed)
 Returned patient call & she has follow up questions from OV yesterday with Dr. Venice Gillis. She is aware XR is currently pending & recommendations will depend on the results. Discussed overflow diarrhea with pt. She feels she needs a more concrete answer and if this is something she will have to learn to live with & if so, she's requesting medication a pill. She does not take miralax  & declines to do so if recommended.

## 2023-10-21 ENCOUNTER — Ambulatory Visit: Payer: Self-pay | Admitting: Family Medicine

## 2023-10-24 ENCOUNTER — Other Ambulatory Visit: Payer: Self-pay

## 2023-10-24 ENCOUNTER — Other Ambulatory Visit: Payer: Self-pay | Admitting: Pharmacy Technician

## 2023-10-24 NOTE — Progress Notes (Signed)
 Specialty Pharmacy Refill Coordination Note  April Weber is a 78 y.o. female contacted today regarding refills of specialty medication(s) Olaparib  (LYNPARZA )   Patient requested Delivery   Delivery date: 10/26/23   Verified address: [address]  [place] [address]   Medication will be filled on 10/25/23.

## 2023-10-25 ENCOUNTER — Other Ambulatory Visit (HOSPITAL_COMMUNITY): Payer: Self-pay

## 2023-10-25 ENCOUNTER — Other Ambulatory Visit: Payer: Self-pay

## 2023-10-25 ENCOUNTER — Encounter: Payer: Self-pay | Admitting: Gynecologic Oncology

## 2023-10-25 NOTE — Addendum Note (Signed)
 Addended by: Eliodoro Guerin on: 10/25/2023 11:30 AM   Modules accepted: Level of Service

## 2023-10-26 ENCOUNTER — Ambulatory Visit: Payer: Self-pay | Admitting: Gastroenterology

## 2023-10-28 ENCOUNTER — Encounter: Payer: Self-pay | Admitting: Gynecologic Oncology

## 2023-10-28 ENCOUNTER — Inpatient Hospital Stay: Payer: Medicare Other | Attending: Gynecologic Oncology | Admitting: Gynecologic Oncology

## 2023-10-28 VITALS — BP 130/70 | HR 76 | Temp 97.6°F | Resp 17 | Ht 67.0 in | Wt 152.8 lb

## 2023-10-28 DIAGNOSIS — Z79899 Other long term (current) drug therapy: Secondary | ICD-10-CM | POA: Insufficient documentation

## 2023-10-28 DIAGNOSIS — C561 Malignant neoplasm of right ovary: Secondary | ICD-10-CM | POA: Insufficient documentation

## 2023-10-28 DIAGNOSIS — Z90722 Acquired absence of ovaries, bilateral: Secondary | ICD-10-CM | POA: Insufficient documentation

## 2023-10-28 NOTE — Patient Instructions (Signed)
 It was good to see you today.  I do not see or feel any evidence of cancer recurrence on your exam.  I will see you for follow-up in 4 months.  As always, if you develop any new and concerning symptoms before your next visit, please call to see me sooner.

## 2023-10-28 NOTE — Progress Notes (Signed)
 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 Large, mixed solid and cystic mass in the low pelvis, difficult to clearly delineate from adjacent ascites although at least 10.2 x 10.0 cm. This is highly concerning for primary ovarian malignancy and most likely arises from the right ovary, although may involve both ovaries. This may be further evaluated by contrast enhanced MRI of the pelvis if desired. 2. Large volume ascites throughout the abdomen and pelvis, presumed malignant. Stranding and nodularity of the omentum and peritoneum, particularly in the ventral abdomen and left upper quadrant, highly suspicious for peritoneal metastatic disease. 3. Cirrhotic morphology of the liver, which may contribute to ascites. 4. Severely atrophic left kidney, consistent with remote prior infectious, obstructive, or ischemic insult. 5. Coronary artery disease.   Aortic Atherosclerosis (ICD10-I70.0).   03/17/2022 Imaging   1. There is a large solid and cystic mass within the right adnexa which measures up to 9.5 cm in maximum dimension. Findings are concerning for a primary ovarian neoplasm. 2. Large volume of ascites within the abdomen and pelvis. Given the presence of peritoneal nodules on the CT from the prior day findings are concerning for malignant ascites.   03/19/2022 Procedure   CT-guided biopsy of omental mass.    03/19/2022 Procedure   Successful ultrasound-guided paracentesis yielding 4.2 liters of peritoneal fluid.   03/19/2022 Procedure   Successful placement of a left internal jugular approach power injectable Port-A-Cath. The catheter is ready for immediate use.   03/30/2022 Initial Diagnosis   Ovarian cancer (HCC)   03/30/2022 Pathology Results    A. RIGHT OVARY AND FALLOPIAN TUBE, SALPINGO OOPHORECTOMY: Poorly differentiated carcinoma immunohistochemically most consistent with a high grade serous carcinoma Tumor measures 10.7 x 10.6 x 4.6 cm Tumor involves ovarian and mesosalpinx serosal surfaces and right fallopian tube pT2a pN n/a  Note: The tumor consists of sheets and cords of high-grade nuclei with areas of necrosis and adjacent prominent stroma suggesting papillary cores the overall morphology is slightly unusual and not classic; therefore, a battery of immunohistochemical stains were performed to confirm the diagnosis and exclude a high-grade germ cell tumor.  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 Additionally there is nonspecific focal PLAP positivity; CD117 positivity and D2-40 positivity of uncertain clinical significance.  The tumor is negative for AFP, hCG, CD30, CD31, CK20, glypican-3, and OCT 3/4.  Dr. Portia Brittle has peer reviewed the case and agrees with diagnosis. Dr. Kavin Parsley has also initiated the workup of the case and agrees with the interpretation.  B. OMENTUM, RESECTION: Benign omentum Negative for tumor  C. RIGHT PELVIC SIDEWALL, BIOPSY: Benign mesothelium, fibrovascular stroma and adipose Negative for tumor  D. ANTERIOR CUL DE SAC, BIOPSY: Benign mesothelium Negative for tumor  E. LEFT PELVIC SIDEWALL, BIOPSY: Benign mesothelium and adipose Negative for tumor   ONCOLOGY TABLE:  OVARY or FALLOPIAN TUBE or PRIMARY PERITONEUM: Resection  Procedure: Right salpingo-oophorectomy Specimen Integrity: Grossly focally disrupted Tumor Site: Right ovary Tumor Size: 10.7 x 10.6 x 4.6 cm Histologic Type: Poorly differentiated carcinoma most consistent with a high-grade serous carcinoma Histologic Grade: High-grade Ovarian Surface Involvement: Surface involvement Fallopian Tube Surface Involvement:  Mesosalpinx serosal surface involvement and right fallopian tube involvement Implants (required  for advanced stage serous/seromucinous borderline tumors only): Not identified Other Tissue/ Organ Involvement: N/A Largest Extrapelvic Peritoneal Focus: N/A Peritoneal/Ascitic Fluid Involvement: N/A Chemotherapy Response Score (CRS): N/A, no known presurgical therapy Regional Lymph Nodes: N/A, no lymph nodes submitted Distant Metastasis:      Distant Site(s) Involved: N/A Pathologic Stage Classification (pTNM, AJCC 8th Edition): pT2a, pN N/A Ancillary Studies: Can be performed on physician request Representative Tumor Block: A8 Comment(s): [None]    03/30/2022 Surgery   Preop Diagnosis: Pelvic mass, malignant ascites   Postoperative Diagnosis: at least stage IIB ovarian cancer, suspected high grade serous by frozen section   Surgery: Diagnostic laparoscopy, right salpingo-oophorectomy, peritoneal biopsies, infra-colic omentectomy    Surgeon:  Brinton Canavan MD    Operative findings: On exam under anesthesia, somewhat mobile mass appreciated in the cul-de-sac to the right.  On agnostic laparoscopy, normal upper abdominal findings other than moderate volume ascites.  Normal-appearing peritoneum, omentum, small bowel.  Right adnexa enlarged with a mass measuring approximately 8-10 cm.  No carcinomatosis appreciated.  Given this, decision made to convert to an open procedure for tumor debulking.  Approximately 5 L of yellow-tinged ascites was removed upon intra-abdominal entry.  Normal upper abdominal survey including smooth diaphragm and liver, stomach, and omentum.  The small bowel was run from the cecum to the ligament of Treitz with no abnormalities or carcinomatosis noted.  Right ovary replaced by an approximately 8 cm smooth mass, dilated and enlarged fallopian tube.  The mass itself was adherent to the right pelvic sidewall, sigmoid colon, and densely adherent to the anterior cul-de-sac/bladder  peritoneum.  Frozen section consistent with invasive carcinoma, likely high-grade.  Left tube and ovary were surgically absent.  Sigmoid colon with adhesions to the left pelvic sidewall, left aspect of the bladder and the left vaginal cuff.  Somewhat redundant sigmoid which looped over on itself secondary to these adhesions.  Numerous diverticula noted of the sigmoid colon as well as the transverse colon.  The end of surgery, an R0 resection was accomplished.     04/13/2022 Cancer Staging   Staging form: Ovary, Fallopian Tube, and Primary Peritoneal Carcinoma, AJCC 8th Edition - Pathologic stage from 04/13/2022: Stage II (pT2, pN0, cM0) - Signed by Almeda Jacobs, MD on 04/13/2022 Stage prefix: Initial diagnosis   04/26/2022 - 08/10/2022 Chemotherapy   Patient is on Treatment Plan : OVARIAN Carboplatin  (AUC 6) + Paclitaxel  (175) q21d X 6 Cycles     05/17/2022 Tumor Marker   Patient's tumor was tested for the following markers: CA-125. Results of the tumor marker test revealed 12.7.   06/06/2022 Genetic Testing   Germline: Negative [facility] Multi-Cancer +RNA Panel.  Report date is 06/06/2022.   The Multi-Cancer + RNA Panel offered by [facility] includes sequencing and/or deletion/duplication analysis of the following 70 genes:  AIP*, ALK, APC*, ATM*, AXIN2*, BAP1*, BARD1*, BLM*, BMPR1A*, BRCA1*, BRCA2*, BRIP1*, CDC73*, CDH1*, CDK4, CDKN1B*, CDKN2A, CHEK2*, CTNNA1*, DICER1*, EPCAM (del/dup only), EGFR, FH*, FLCN*, GREM1 (promoter dup only), HOXB13, KIT, LZTR1, MAX*, MBD4, MEN1*, MET, MITF, MLH1*, MSH2*, MSH3*, MSH6*, MUTYH*, NF1*, NF2*, NTHL1*, PALB2*, PDGFRA, PMS2*, POLD1*, POLE*, POT1*, PRKAR1A*, PTCH1*, PTEN*, RAD51C*, RAD51D*, RB1*, RET, SDHA* (sequencing only), SDHAF2*, SDHB*, SDHC*, SDHD*, SMAD4*, SMARCA4*, SMARCB1*, SMARCE1*, STK11*, SUFU*, TMEM127*, TP53*, TSC1*, TSC2*, VHL*. RNA analysis is performed for * genes.  Somatic: Positive genomic instability score (GIS) of 62 through [facility].  No  pathogenic variants detected in BRCA1/2.  Report date is June 10, 2022.   [facility] CDx includes sequencing and  large rearrangement analysis of BRCA1/2 and genomic instability status through loss of heterozygosity (LOH), telomeric allelic imbalance (TAI) and large-scale state transitions (LST).     06/08/2022 Tumor Marker   Patient's tumor was tested for the following markers: Ca-125. Results of the tumor marker test revealed 16.5.   06/13/2022 - 06/17/2022 Hospital Admission   The patient was admitted to the hospital and found to have diarrhea, diverticulitis and pancytopenia   06/13/2022 Imaging   Colonic diverticulosis. Inflammatory stranding around the mid sigmoid colon compatible with active diverticulitis.   Interval resection of the previously seen lower pelvic solid and cystic mass and resolution of ascites.   Aortic atherosclerosis.   Severely atrophic left kidney, stable since prior study.   06/30/2022 Tumor Marker   Patient's tumor was tested for the following markers: CA-125. Results of the tumor marker test revealed 15.   07/21/2022 Tumor Marker   Patient's tumor was tested for the following markers: CA-125. Results of the tumor marker test revealed 11.4.   08/12/2022 Tumor Marker   Patient's tumor was tested for the following markers: CA-125. Results of the tumor marker test revealed 9.7.   08/14/2022 Imaging   1. Wall thickening of the sigmoid colon with adjacent inflammatory change, overall improved when compared with prior exam. New focal collection of gas involving the wall of the sigmoid colon which extends inferiorly to the area of the left vaginal cuff with contrast material seen within the vaginal cuff. 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 2. Wall thickening of the left lateral wall of the urinary bladder with air seen within the urinary bladder, findings raise concern for additional area of fistulization to the bladder. CT cystogram could be  performed for better evaluation. 3. New pelviectasis and mild dilation of the left ureter, likely secondary to inflammatory change at the area of the left ureterovesicular junction. Chronic atrophy of the left kidney. 4. Increased wall thickening of a large colonic diverticulum of the proximal transverse colon, likely due to diverticulitis. 5. Aortic Atherosclerosis (ICD10-I70.0).     08/23/2022 Imaging   1. Increased large volume gas within the bladder. This raises concern for colovesical fistula. Consider CT cystogram for further evaluation. 2. Similar to slightly decreased inflammatory changes surrounding the sigmoid colon. The previous collection of gas involving the wall of the sigmoid colon has decreased. There is a small amount of residual gas in the area of the left vaginal cuff. There is again a soft tissue tract extending from the vaginal cuff to the sigmoid colon, though no gas is seen within this tract. 3. Decreased size and wall thickening about the large diverticulum in the proximal transverse colon. 4. Slight decrease in pelviectasis and dilation of the left ureter.   Aortic Atherosclerosis (ICD10-I70.0).     08/27/2022 Imaging   1. Continued sigmoid wall thickening and inflammatory reaction with diverticulosis with increased inflammation and layering reactive pelvic fluid since April 15. Further evaluation to exclude underlying colonic lesion is recommended when clinically feasible. 2. There is now a demonstrable fistulous tract from the sigmoid colon into the left dorsal bladder wall, with air and fluid in the bladder and patchy enteric contrast now settling in the dependent bladder. 3. The known left-sided colovaginal fistula is not well seen today but there is contrast in the vagina consistent with continued tract patency. 4. There is now moderate bilateral hydroureteronephrosis into the pelvis where the ureters are difficult to follow due to the pre-existing inflammatory  process, with ureteral tethering by inflammatory  process most likely causing the obstructive uropathy. No stone disease is seen. 5. Severe chronic atrophy of the left kidney. 6. Aortic and coronary artery atherosclerosis. 7. Anemia. 8. Prominent common bile duct at 12 mm, unchanged. 9. Constipation. 10. Osteopenia, scoliosis, and advanced degenerative change of the lumbar spine with acquired spinal stenosis L4-5.   09/01/2022 Surgery   Preoperative diagnosis: colovesical fistula   Postoperative diagnosis: same    Procedure: laparoscopic converted to open sigmoid resection with anastomosis, diverting loop ileostomy, take down of colovesical fistula   Surgeon: Harman Lightning, M.D.   Asst: Noland Battles, [facility]   Anesthesia: GETA   Indications for procedure: April Weber is a 78 y.o. year old female with symptoms of recurrent UTIs and abdominal pain.   Description of procedure: The patient was brought into the operative suite. Anesthesia was administered with General endotracheal anesthesia. WHO checklist was applied. The patient was then placed in lithotomy position. The area was prepped and draped in the usual sterile fashion.   Dr. Rozanne Corners performed a cysto and plans ureteral stents prior to surgery.   Next, a left subcostal incision was made. A 5mm trocar was used to gain access to the peritoneal cavity by optical entry technique. Pneumoperitoneum was applied with a high flow and low pressure. The laparoscope was reinserted to confirm position.   3 additional trocars were placed 1 5 mm trocar in the right upper abdomen, 1 5 mm trocar in the infraumbilical space, and 1 12 mm trocar in the right lower quadrant.   There were no adhesions to the abdominal wall. The sigmoid colon was thickened and chronically inflamed. There was one loop of small intestine adhesed to the sigmoid colon, this was sharply dissected free. The White line of Toldt was incised. Blunt dissection was used to free  up the colon from the bladder and abdominal wall. This was very difficult. During this dissection Dr. Lisabeth Rider came in to look at the abdomen and determined no evidence of cancer was present. After about 30 minutes of laparoscopic dissection, I did not think I could safely complete dissection laparoscopically and so a lower midline incision was made.   Next, palpation identified the ureters which were well away from the dissection. Blunt dissection was able to free the colon completely from the bladder and side wall. Next, a blue load contour stapler was used to divide the distal sigmoid colon. The mesentery was taken with ligasure device. The left colon was fully mobilized a portion of colon proximal to the inflammatory changes was purse stringed and divided distally. A 29 mm anvil was placed into the colon and purse string tied down.    Next, Dr. Rozanne Corners came in to repair the bladder. See his dictation for more information.   The rectum was mobilized more by freeing it from the side walls. At this point I went below to palpate the rectum, there was some difficulty to get the dilator to reach the staple line. There was an attempt to pass the stapler but this led to a tear in the distal rectum. Dr. Hershell Lose came in at this time to assist and a further margin of sigmoid/rectum was taken with the contour stapler. This allowed the stapler to come to the end and anastomosis was performed. Leak test showed a small leak distal to the staple line that was repaired with interrupted 3-0 vicryl. Due to this leak and thin tissue at the area, decision was made to divert with loop ileostomy.   The  12 mm trocar site was enlarged and a portion of distal ileum brought through the hole. A 19 fr blake drain was brought through RUQ incision and tip placed into the pelvis. The abdomen was irrigated. Hemostasis was intact. The peritoneum was closed with 2-0 vicryl. The fascia was closed with 0 PDS. The ileostomy was matured with 3-0  and 2-3 cm of Brooking. The skin was closed with staples. Ileostomy bag was placed. All counts were correct.   Findings: colovesical fistula, dense inflammation of the sigmoid colon   09/01/2022 Surgery   Preoperative diagnosis: 1.  Colovesical fistula 2.  Atrophic left kidney 3.  Right hydronephrosis   Postoperative diagnosis: 1.  Colovesical fistula 2.  Atrophic left kidney 3.  Right hydronephrosis   Procedures: 1.  Cystoscopy 2.  Bilateral retrograde pyelography with interpretation 3.  Bilateral ureteral stent placement (right -6 x 24, left -6 x 26) 4.  Repair of bladder fistula   10/01/2022 Tumor Marker   Patient's tumor was tested for the following markers: CA-125. Results of the tumor marker test revealed 14.   10/11/2022 -  Chemotherapy   She started taking olaparib    10/25/2022 Tumor Marker   Patient's tumor was tested for the following markers: CA-125. Results of the tumor marker test revealed 10.6.   11/15/2022 Procedure   Successful removal of implanted Port-A-Cath.    02/14/2023 Tumor Marker   Patient's tumor was tested for the following markers: CA-125. Results of the tumor marker test revealed 9.5.   03/14/2023 Imaging   CT ABDOMEN PELVIS WO CONTRAST  Result Date: 03/18/2023 CLINICAL DATA:  Ovarian cancer.  Restaging.  * Tracking Code: BO * EXAM: CT ABDOMEN AND PELVIS WITHOUT CONTRAST TECHNIQUE: Multidetector CT imaging of the abdomen and pelvis was performed following the standard protocol without IV contrast. RADIATION DOSE REDUCTION: This exam was performed according to the departmental dose-optimization program which includes automated exposure control, adjustment of the mA and/or kV according to patient size and/or use of iterative reconstruction technique. COMPARISON:  08/27/2022 FINDINGS: Lower chest: Unremarkable. Hepatobiliary: Stable tiny hypodensity in the central liver. 6 mm hypodensity subcapsular right liver on 28/2 is stable. There is no evidence for  gallstones, gallbladder wall thickening, or pericholecystic fluid. No intrahepatic or extrahepatic biliary dilation. Pancreas: No focal mass lesion. No dilatation of the main duct. No intraparenchymal cyst. No peripancreatic edema. Spleen: No splenomegaly. No suspicious focal mass lesion. Adrenals/Urinary Tract: No adrenal nodule or mass. Left kidney markedly atrophic with stable small cyst. No followup imaging is recommended. No hydronephrosis in the right kidney. Tiny hypodensity upper pole right kidney is too small to characterize but likely benign. No evidence for hydroureter. The urinary bladder appears normal for the degree of distention. Stomach/Bowel: Stomach is unremarkable. No gastric wall thickening. No evidence of outlet obstruction. Duodenum is normally positioned as is the ligament of Treitz. Large duodenal diverticulum again noted. No small bowel wall thickening. No small bowel dilatation. Diverticuli are seen scattered along the entire length of the colon without CT findings of diverticulitis. Interval left hemicolectomy with rectosigmoid anastomosis. Vascular/Lymphatic: There is moderate atherosclerotic calcification of the abdominal aorta without aneurysm. There is no gastrohepatic or hepatoduodenal ligament lymphadenopathy. No retroperitoneal or mesenteric lymphadenopathy. No pelvic sidewall lymphadenopathy. Reproductive: Hysterectomy.  There is no adnexal mass. Other: No intraperitoneal free fluid. Musculoskeletal: No worrisome lytic or sclerotic osseous abnormality. 11 mm subcutaneous nodule anterior right abdominal wall on 46/2 is new in the interval. IMPRESSION: 1. Interval left hemicolectomy with rectosigmoid anastomosis. 2.  11 mm subcutaneous nodule anterior right abdominal wall is new in the interval. This is nonspecific and may be related to recent injection. Close follow-up recommended. 3. No findings of recurrent or metastatic disease on the current exam. 4.  Aortic Atherosclerosis  (ICD10-I70.0). Electronically Signed   By: Donnal Fusi M.D.   On: 03/18/2023 10:30   MM 3D DIAGNOSTIC MAMMOGRAM UNILATERAL LEFT BREAST  Result Date: 03/15/2023 CLINICAL DATA:  Interval follow-up of a likely benign focal asymmetry in the LEFT breast. Personal history of malignant RIGHT mastectomy and excisional biopsy of a complex sclerosing lesion from the LEFT breast in May, 2023. EXAM: DIGITAL DIAGNOSTIC UNILATERAL LEFT MAMMOGRAM WITH TOMOSYNTHESIS AND CAD TECHNIQUE: Left digital diagnostic mammography and breast tomosynthesis was performed. The images were evaluated with computer-aided detection. COMPARISON:  Previous exams, most recently May and June, 2023. ACR Breast Density Category b: There are scattered areas of fibroglandular density. FINDINGS: Full field CC and MLO views were obtained. No findings suspicious for malignancy. The focal asymmetry in the outer breast questioned previously represents normal fibroglandular tissue. IMPRESSION: No mammographic evidence of malignancy involving the LEFT breast. RECOMMENDATION: Screening LEFT mammogram in one year.(Code:[id number]-[id number]) I have discussed the findings and recommendations with the patient. If applicable, a reminder letter will be sent to the patient regarding the next appointment. BI-RADS CATEGORY  1: Negative. Electronically Signed   By: Rinda Cheers M.D.   On: 03/15/2023 11:24      03/16/2023 Tumor Marker   Patient's tumor was tested for the following markers: CA-125. Results of the tumor marker test revealed 7.8.   05/22/2023 Tumor Marker   Patient's tumor was tested for the following markers: CA-125. Results of the tumor marker test revealed 9.0.   07/14/2023 Imaging   CT ABDOMEN PELVIS WO CONTRAST Result Date: 07/21/2023 CLINICAL DATA:  Right ovarian epithelial cancer. Asymptomatic. Re-stage * Tracking Code: BO * EXAM: CT ABDOMEN AND PELVIS WITHOUT CONTRAST TECHNIQUE: Multidetector CT imaging of the abdomen and pelvis was performed  following the standard protocol without IV contrast. RADIATION DOSE REDUCTION: This exam was performed according to the departmental dose-optimization program which includes automated exposure control, adjustment of the mA and/or kV according to patient size and/or use of iterative reconstruction technique. COMPARISON:  03/14/2023 FINDINGS: Lower chest: Clear lung bases. Normal heart size without pericardial or pleural effusion. Right mastectomy. Hepatobiliary: Scattered hepatic cysts of less than a cm are unchanged. Normal gallbladder, without biliary ductal dilatation. Pancreas: Normal, without mass or ductal dilatation. Spleen: Normal in size, without focal abnormality. Adrenals/Urinary Tract: Normal adrenal glands. Subcentimeter upper pole right renal low-density lesion is likely a cyst. Marked left renal atrophy. Punctate interpolar left renal collecting system calculus versus vascular calcification. Low-density left renal lesions of up to 1.0 cm are likely cysts . In the absence of clinically indicated signs/symptoms require(s) no independent follow-up. No hydronephrosis. No bladder calculi. Stomach/Bowel: Apparent gastric antral wall thickening on 17/2 could be due to underdistention. This is unchanged. Surgical sutures within the rectosigmoid junction. Colonic stool burden suggests constipation. Normal terminal ileum. Descending duodenal diverticulum. Vascular/Lymphatic: Aortic atherosclerosis. No abdominopelvic adenopathy. Reproductive: Hysterectomy.  No adnexal mass. Other: No significant free fluid. Moderate pelvic floor laxity. No evidence of omental or peritoneal disease. Musculoskeletal: The previously described right anterior pelvic wall nodule has resolved. Osteopenia. Lumbosacral spondylosis. Minimal convex right lumbar spine curvature. IMPRESSION: 1. No acute process or evidence of metastatic disease in the abdomen or pelvis. The anterior right pelvic wall nodule on the prior exam is resolved.  2.  Marked left renal atrophy.  Possible left nephrolithiasis. 3. Apparent gastric antral wall thickening could be due to underdistention or gastritis. 4.  Aortic Atherosclerosis (ICD10-I70.0). Electronically Signed   By: Lore Rode M.D.   On: 07/21/2023 08:41      07/15/2023 Tumor Marker   Patient's tumor was tested for the following markers: CA-125. Results of the tumor marker test revealed 9.6.   09/16/2023 Tumor Marker   Patient's tumor was tested for the following markers: CA-125. Results of the tumor marker test revealed 10.9.     Interval History: Doing well.  Tolerating the PARPi without significant side effect.  Still having some lightheadedness intermittently although this has improved.  Continues to follow with GI, taking MiraLAX  intermittently although notes that her fecal incontinence is worse when she uses MiraLAX .  Denies any urinary symptoms.  Denies any vaginal bleeding.  Past Medical/Surgical History: Past Medical History:  Diagnosis Date   Acute diverticulitis 06/13/2022   Arthritis    Breast cancer (HCC) 1980   RIGHT   Chronic kidney disease    has one kidney   Diverticulitis of colon    DVT (deep venous thrombosis) (HCC)    on RIGHT leg-hx of, superficial   Dyspnea    Headache    occ migraine   Hyperlipidemia    on meds   Hypertension    on meds   Irritant contact dermatitis associated with fecal stoma 09/28/2022   Neutropenic fever (HCC) 06/15/2022   Osteopenia    Ovarian cancer (HCC) 03/2022   Peripheral vascular disease (HCC)    PONV (postoperative nausea and vomiting)    Seasonal allergies    Thyroid  disease    on meds    Past Surgical History:  Procedure Laterality Date   ABDOMINAL HYSTERECTOMY     APPENDECTOMY     BLADDER REPAIR  09/01/2022   Procedure: REPAIR BLADDER FISTULA WITH HYDRODISTENTION WITH METHYLINE BLUE;  Surgeon: Florencio Hunting, MD;  Location: [facility];  Service: Urology;;   BREAST BIOPSY     BREAST LUMPECTOMY WITH RADIOACTIVE SEED  LOCALIZATION Left 12/01/2021   Procedure: LEFT BREAST RADIOACTIVE SEED GUIDED LUMPECTOMY;  Surgeon: Oza Blumenthal, MD;  Location: [facility];  Service: General;  Laterality: Left;  LMA   COLON RESECTION N/A 09/01/2022   Procedure: LAPAROSCOPIC SIGMOIDECTOMY CONVERTED TO OPEN, TAKEDOWN OF COLOVESICAL FISTULA AND LOOP COLOSTOMY;  Surgeon: Dorrie Gaudier Alphonso Aschoff, MD;  Location: [facility];  Service: General;  Laterality: N/A;   COLONOSCOPY  2011   [email or web address]   CYSTOSCOPY WITH STENT PLACEMENT  09/01/2022   Procedure: CYSTOSCOPY WITH BILATERAL URETERAL STENT PLACEMENTS;  Surgeon: Florencio Hunting, MD;  Location: [facility];  Service: Urology;;   DEBULKING N/A 03/30/2022   Procedure: TUMOR DEBULKING;  Surgeon: Suzi Essex, MD;  Location: [facility];  Service: Gynecology;  Laterality: N/A;   EYE SURGERY     78 years old   ILEOSTOMY CLOSURE N/A 01/11/2023   Procedure: LOOP ILEOSTOMY REVERSAL;  Surgeon: Dorrie Gaudier Alphonso Aschoff, MD;  Location: [facility];  Service: General;  Laterality: N/A;   IR IMAGING GUIDED PORT INSERTION  03/19/2022   IR REMOVAL TUN ACCESS W/ PORT W/O FL MOD SED  11/15/2022   LAPAROSCOPY N/A 03/30/2022   Procedure: LAPAROSCOPY DIAGNOSTIC;  Surgeon: Suzi Essex, MD;  Location: [facility];  Service: Gynecology;  Laterality: N/A;   MASTECTOMY Right 1984   ROTATOR CUFF REPAIR     SALPINGOOPHORECTOMY Bilateral 03/30/2022   Procedure: OPEN SALPINGO OOPHORECTOMY;  Surgeon: Suzi Essex, MD;  Location: [facility];  Service: Gynecology;  Laterality: Bilateral;   THYROID  SURGERY Bilateral 1967   VARICOSE VEIN SURGERY     ablation   WISDOM TOOTH EXTRACTION      Family History  Problem Relation Age of Onset   Heart attack Mother    Alcohol abuse Mother    Lung cancer Mother        d. 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  Tobacco Use   Smoking status: Former    Current packs/day: 0.00    Types: Cigarettes    Quit date: 05/11/1967    Years since quitting: 56.5    Passive exposure: Past   Smokeless tobacco: Never  Vaping Use   Vaping status: Never Used  Substance and Sexual Activity   Alcohol use: No   Drug use: No   Sexual activity: Not Currently    Birth control/protection: Post-menopausal, Surgical    Comment: hyst  Other Topics Concern   Not on file  Social History Narrative   Lives alone - very close with her neighbors   Social Drivers of Corporate investment banker Strain: Low Risk  (01/25/2023)   Overall Financial Resource Strain (CARDIA)    Difficulty of Paying Living Expenses: Not hard at all  Food Insecurity: No Food Insecurity (01/25/2023)   Hunger Vital Sign    Worried About Running Out of Food in the Last Year: Never true    Ran Out of Food in the Last Year: Never true  Transportation Needs: No Transportation Needs (01/25/2023)   PRAPARE - Administrator, Civil Service (Medical): No    Lack of Transportation (Non-Medical): No  Physical Activity: Insufficiently Active (01/25/2023)   Exercise Vital Sign    Days of Exercise per Week: 3 days    Minutes of Exercise per Session: 30 min  Stress: No Stress Concern Present (01/25/2023)   [employer] of Occupational Health - Occupational Stress Questionnaire    Feeling of Stress : Not at all  Social Connections: Unknown (01/25/2023)   Social Connection and Isolation Panel    Frequency of Communication with Friends and Family: More than three times a week    Frequency of Social Gatherings with Friends and Family: More than three  times a week    Attends Religious Services: More than 4 times per year    Active Member of [employer] or Organizations: Yes    Attends Engineer, structural: More than 4 times per year    Marital Status: Patient declined    Current Medications:  Current Outpatient Medications:    acetaminophen  (TYLENOL ) 500 MG tablet, Take 2 tablets (1,000 mg total) by mouth every 6 (six) hours as needed for mild pain., Disp: 30 tablet, Rfl: 0   amLODipine  (NORVASC ) 5 MG tablet, Take 1 tablet (5 mg total) by mouth daily., Disp: 90 tablet, Rfl: 3  aspirin  EC 81 MG tablet, Take 81 mg by mouth at bedtime. Swallow whole., Disp: , Rfl:    atenolol  (TENORMIN ) 25 MG tablet, Take 0.5 tablets (12.5 mg total) by mouth 2 (two) times daily., Disp: 90 tablet, Rfl: 3   Calcium  Carb-Cholecalciferol  (CALCIUM  + VITAMIN D3 PO), Take 1 tablet by mouth 2 (two) times daily., Disp: , Rfl:    levothyroxine  (SYNTHROID ) 100 MCG tablet, Take 1 tablet (100 mcg total) by mouth in the morning., Disp: 90 tablet, Rfl: 3   Multiple Vitamin (MULTIVITAMIN) tablet, Take 1 tablet by mouth in the morning., Disp: , Rfl:    olaparib  (LYNPARZA ) 100 MG tablet, Take 1 tablet (100 mg total) by mouth 2 (two) times daily. Swallow whole. May take with food to decrease nausea and vomiting., Disp: 60 tablet, Rfl: 11   simvastatin  (ZOCOR ) 20 MG tablet, Take 1 tablet (20 mg total) by mouth daily., Disp: 90 tablet, Rfl: 3  Review of Systems: Denies appetite changes, fevers, chills, fatigue, unexplained weight changes. Denies hearing loss, neck lumps or masses, mouth sores, ringing in ears or voice changes. Denies cough or wheezing.  Denies shortness of breath. Denies chest pain or palpitations. Denies leg swelling. Denies abdominal distention, pain, blood in stools, constipation, nausea, vomiting, or early satiety. Denies pain with intercourse, dysuria, frequency, hematuria or incontinence. Denies hot flashes, pelvic pain, vaginal bleeding or vaginal  discharge.   Denies joint pain, back pain or muscle pain/cramps. Denies itching, rash, or wounds. Denies dizziness, headaches, numbness or seizures. Denies swollen lymph nodes or glands, denies easy bruising or bleeding. Denies anxiety, depression, confusion, or decreased concentration.  Physical Exam: BP (!) 154/69 (BP Location: Left Arm, Patient Position: Sitting) Comment: CMA notified  Pulse 76   Temp 97.6 F (36.4 C) (Tympanic)   Resp 17   Ht 5' 7 (1.702 m)   Wt 152 lb 12.8 oz (69.3 kg)   SpO2 99%   BMI 23.93 kg/m  General: Alert, oriented, no acute distress. HEENT: Posterior oropharynx clear, sclera anicteric. Chest: Clear to auscultation bilaterally.  No wheezes or rhonchi. Cardiovascular: Regular rate and rhythm, no murmurs. Abdomen: soft, nontender.  Normoactive bowel sounds.  No masses or hepatosplenomegaly appreciated.  Well-healed incisions.  Extremities: Grossly normal range of motion.  Warm, well perfused.  No edema bilaterally. Skin: No rashes or lesions noted. Lymphatics: No cervical, supraclavicular, or inguinal adenopathy. GU: Normal appearing external genitalia without erythema, excoriation, or lesions.  Speculum exam reveals moderately atrophic vaginal mucosa, cuff intact, no masses appreciated.  Bimanual exam reveals no masses or nodularity.    Laboratory & Radiologic Studies:          Component Ref Range & Units (hover) 1 mo ago 3 mo ago 5 mo ago 7 mo ago 8 mo ago 11 mo ago 12 mo ago  Cancer Antigen (CA) 125 10.9 9.6 CM 9.0 CM 7.8 CM 9.5 CM 8.3 CM 10.2      Assessment & Plan: April Weber is a 78 y.o. 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  HRD positive.  Olaparib  started in mid 2024.  Recent history of significant for complications related to diverticular disease with development of a Colovesicular fistula.  She underwent sigmoid colectomy, diverting loop ileostomy and bladder repair in April  2024.  Ultimately had ileostomy reversal in September 2024.   Patient is overall doing well, NED on exam today. Most recent imaging in March showed resolution of previously seen anterior  right pelvic wall nodule.  No evidence of metastatic disease.   She is overall tolerating PARPi without significant side effects.   She sees Dr. Marton Sleeper in July. I will see her for follow-up in October.  20 minutes of total time was spent for this patient encounter, including preparation, face-to-face counseling with the patient and coordination of care, and documentation of the encounter.  Wiley Hanger, MD  Division of Gynecologic Oncology  Department of Obstetrics and Gynecology  [facility]

## 2023-11-08 ENCOUNTER — Telehealth: Payer: Self-pay | Admitting: Family Medicine

## 2023-11-08 NOTE — Telephone Encounter (Signed)
 Aware prescription ready

## 2023-11-08 NOTE — Telephone Encounter (Signed)
 Pt is requesting a prescription for mastectomy supplies she needs 6 mastectomy bras and 1 prothesis. She would like to pick up this prescription

## 2023-11-08 NOTE — Telephone Encounter (Signed)
 Script written and placed on covering providers desk

## 2023-11-15 ENCOUNTER — Inpatient Hospital Stay (HOSPITAL_BASED_OUTPATIENT_CLINIC_OR_DEPARTMENT_OTHER): Admitting: Hematology and Oncology

## 2023-11-15 ENCOUNTER — Inpatient Hospital Stay: Attending: Gynecologic Oncology

## 2023-11-15 ENCOUNTER — Encounter: Payer: Self-pay | Admitting: Hematology and Oncology

## 2023-11-15 VITALS — BP 141/71 | HR 61 | Temp 98.0°F | Resp 17 | Wt 154.6 lb

## 2023-11-15 DIAGNOSIS — N1831 Chronic kidney disease, stage 3a: Secondary | ICD-10-CM

## 2023-11-15 DIAGNOSIS — C561 Malignant neoplasm of right ovary: Secondary | ICD-10-CM

## 2023-11-15 LAB — CBC WITH DIFFERENTIAL/PLATELET
Abs Immature Granulocytes: 0.03 K/uL (ref 0.00–0.07)
Basophils Absolute: 0.1 K/uL (ref 0.0–0.1)
Basophils Relative: 1 %
Eosinophils Absolute: 0.2 K/uL (ref 0.0–0.5)
Eosinophils Relative: 3 %
HCT: 36.9 % (ref 36.0–46.0)
Hemoglobin: 13 g/dL (ref 12.0–15.0)
Immature Granulocytes: 0 %
Lymphocytes Relative: 13 %
Lymphs Abs: 0.9 K/uL (ref 0.7–4.0)
MCH: 33.9 pg (ref 26.0–34.0)
MCHC: 35.2 g/dL (ref 30.0–36.0)
MCV: 96.1 fL (ref 80.0–100.0)
Monocytes Absolute: 0.6 K/uL (ref 0.1–1.0)
Monocytes Relative: 9 %
Neutro Abs: 5 K/uL (ref 1.7–7.7)
Neutrophils Relative %: 74 %
Platelets: 230 K/uL (ref 150–400)
RBC: 3.84 MIL/uL — ABNORMAL LOW (ref 3.87–5.11)
RDW: 13.1 % (ref 11.5–15.5)
WBC: 6.9 K/uL (ref 4.0–10.5)
nRBC: 0 % (ref 0.0–0.2)

## 2023-11-15 LAB — COMPREHENSIVE METABOLIC PANEL WITH GFR
ALT: 16 U/L (ref 0–44)
AST: 20 U/L (ref 15–41)
Albumin: 4.3 g/dL (ref 3.5–5.0)
Alkaline Phosphatase: 59 U/L (ref 38–126)
Anion gap: 7 (ref 5–15)
BUN: 18 mg/dL (ref 8–23)
CO2: 28 mmol/L (ref 22–32)
Calcium: 9.4 mg/dL (ref 8.9–10.3)
Chloride: 102 mmol/L (ref 98–111)
Creatinine, Ser: 1.37 mg/dL — ABNORMAL HIGH (ref 0.44–1.00)
GFR, Estimated: 39 mL/min — ABNORMAL LOW (ref >60)
Glucose, Bld: 90 mg/dL (ref 70–99)
Potassium: 3.9 mmol/L (ref 3.5–5.1)
Sodium: 137 mmol/L (ref 135–145)
Total Bilirubin: 0.6 mg/dL (ref 0.0–1.2)
Total Protein: 6.8 g/dL (ref 6.5–8.1)

## 2023-11-15 NOTE — Assessment & Plan Note (Addendum)
 She was diagnosed with ovarian cancer in 2023, status post surgery and completion adjuvant treatment in April 2024 Final pathology: High grade papillary serous, 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  She had severe diverticulitis complicated by fistula requiring diverting loop ileostomy and aggressive antibiotics treatment, subsequently reversed She has been placed on olaparib  since June 2024  She tolerated elaborate well Labs are satisfactory The plan will be to continue Olaparib  at current dose I recommend repeat imaging study in October before she sees her GYN surgeon I will see her in November She will complete treatment with olaparib  June 2026

## 2023-11-15 NOTE — Assessment & Plan Note (Addendum)
 She is prone to get dehydrated I recommend frequent oral fluid intake Overall, her renal function is stable Due to solitary functioning kidney, I will order CT imaging without contrast

## 2023-11-15 NOTE — Progress Notes (Signed)
 [facility] OFFICE PROGRESS NOTE  Patient Care Team: Jolinda Norene HERO, DO as PCP - General (Family Medicine) Rox Charleston, MD as Attending Physician (Obstetrics and Gynecology) Patty Anes, MD as Consulting Physician (Family Medicine) Vicci Mcardle, OD (Optometry) Care, [facility]) Lonn Hicks, MD as Consulting Physician (Hematology and Oncology) [name], Herlene Righter, MD as Consulting Physician (General Surgery) Ivonne Harlene RAMAN, RD as Dietitian (Dietician)  Assessment & Plan Right ovarian epithelial cancer [facility]) She was diagnosed with ovarian cancer in 2023, status post surgery and completion adjuvant treatment in April 2024 Final pathology: High grade papillary serous, 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  She had severe diverticulitis complicated by fistula requiring diverting loop ileostomy and aggressive antibiotics treatment, subsequently reversed She has been placed on olaparib  since June 2024  She tolerated elaborate well Labs are satisfactory The plan will be to continue Olaparib  at current dose I recommend repeat imaging study in October before she sees her GYN surgeon I will see her in November She will complete treatment with olaparib  June 2026 Stage 3a chronic kidney disease (HCC) She is prone to get dehydrated I recommend frequent oral fluid intake Overall, her renal function is stable Due to solitary functioning kidney, I will order CT imaging without contrast  Orders Placed This Encounter  Procedures   CT ABDOMEN PELVIS WO CONTRAST    No Iv contrast due to 1 functioning kidney    Standing Status:   Future    Expected Date:   02/16/2024    Expiration Date:   11/14/2024    Preferred imaging location?:   [facility]    If indicated for the ordered procedure, I authorize the administration of oral contrast media per Radiology protocol:   No    Reason for no oral contrast::   no need  oral contrast     Hicks Lonn, MD  INTERVAL HISTORY: she returns for treatment follow-up Complications related to previous cycle of chemotherapy included none She denies recent abdominal bloating, nausea or changes in bowel habits Overall, she tolerated olaparib  well  PHYSICAL EXAMINATION: ECOG PERFORMANCE STATUS: 0 - Asymptomatic  Lab Results  Component Value Date   CAN125 10.9 09/15/2023   CAN125 9.6 07/14/2023   CAN125 9.0 05/19/2023      Latest Ref Rng & Units 11/15/2023   10:05 AM 09/15/2023    9:53 AM 07/14/2023    9:47 AM  CBC  WBC 4.0 - 10.5 K/uL 6.9  7.4  6.9   Hemoglobin 12.0 - 15.0 g/dL 86.9  86.8  87.6   Hematocrit 36.0 - 46.0 % 36.9  37.1  34.6   Platelets 150 - 400 K/uL 230  236  216       Chemistry      Component Value Date/Time   NA 137 11/15/2023 1005   NA 139 10/10/2023 0855   K 3.9 11/15/2023 1005   CL 102 11/15/2023 1005   CO2 28 11/15/2023 1005   BUN 18 11/15/2023 1005   BUN 20 10/10/2023 0855   CREATININE 1.37 (H) 11/15/2023 1005   CREATININE 0.93 08/10/2022 0857   CREATININE 0.99 02/05/2013 0757      Component Value Date/Time   CALCIUM  9.4 11/15/2023 1005   ALKPHOS 59 11/15/2023 1005   AST 20 11/15/2023 1005   AST 12 (L) 08/10/2022 0857   ALT 16 11/15/2023 1005   ALT 11 08/10/2022 0857   BILITOT 0.6 11/15/2023 1005   BILITOT 0.5 10/10/2023 0855  BILITOT 0.3 08/10/2022 0857       Vitals:   11/15/23 1027  BP: (!) 141/71  Pulse: 61  Resp: 17  Temp: 98 F (36.7 C)  SpO2: 100%   Filed Weights   11/15/23 1027  Weight: 154 lb 9.6 oz (70.1 kg)   Other relevant data reviewed during this visit included CBC and CMP

## 2023-11-16 ENCOUNTER — Encounter: Payer: Self-pay | Admitting: Gastroenterology

## 2023-11-16 ENCOUNTER — Ambulatory Visit (INDEPENDENT_AMBULATORY_CARE_PROVIDER_SITE_OTHER): Admitting: Gastroenterology

## 2023-11-16 ENCOUNTER — Ambulatory Visit: Admitting: Gastroenterology

## 2023-11-16 VITALS — BP 120/60 | HR 64 | Ht 65.25 in | Wt 153.1 lb

## 2023-11-16 DIAGNOSIS — Z8543 Personal history of malignant neoplasm of ovary: Secondary | ICD-10-CM

## 2023-11-16 DIAGNOSIS — R159 Full incontinence of feces: Secondary | ICD-10-CM | POA: Diagnosis not present

## 2023-11-16 DIAGNOSIS — K5909 Other constipation: Secondary | ICD-10-CM

## 2023-11-16 DIAGNOSIS — Z8719 Personal history of other diseases of the digestive system: Secondary | ICD-10-CM

## 2023-11-16 DIAGNOSIS — Z9049 Acquired absence of other specified parts of digestive tract: Secondary | ICD-10-CM | POA: Diagnosis not present

## 2023-11-16 DIAGNOSIS — Z87891 Personal history of nicotine dependence: Secondary | ICD-10-CM | POA: Diagnosis not present

## 2023-11-16 DIAGNOSIS — N39490 Overflow incontinence: Secondary | ICD-10-CM

## 2023-11-16 LAB — CA 125: Cancer Antigen (CA) 125: 10.8 U/mL (ref 0.0–38.1)

## 2023-11-16 NOTE — Progress Notes (Signed)
 Chief Complaint: FU  Referring Provider:  Jolinda Norene HERO, DO      ASSESSMENT AND PLAN;   #1. Chronic constipation with overflow incontinence. Neg colon 12/2020. Much better.  #2. H/O diverticulitis S/p sigmoid colectomy with loop ileostomy 08/2022 and takedown 01/2023  #3. H/O ovarian cancer in 2023,s/p surgery/adjuvant treatment in April 2024   Plan: -Continue current high fiber diet -No need to rpt colon -Call if still with problems. She is schedule have CT AP in oct 2025 (FU ovarian Ca) -FU as needed.   HPI:    April Weber is a 78 y.o. female  With history of diverticulitis s/p sigmoid colectomy with loop ileostomy 08/2022 and takedown 01/2023, Stage IIB ovarian cancer s/p R salpingo-oophorectomy, peritoneal biopsies, infracolic omentectomy 11/ 2023, breast cancer (1980) personal history of colon polyps no longer on surveillance, CKD with one functioning kidney, HTN, HLD, who presents today for FU   fecal incontinence is better  History of Present Illness April Weber is a 78 year old female who presents for follow-up on bowel management.  She has experienced significant improvement in her incontinence symptoms, now able to have bowel movements two to three times a day with less soiling of her clothes. Monitoring her fiber intake has helped maintain regular bowel movements. Her stool is soft but firm enough to signal when she needs to go to the bathroom, allowing her to reach a restroom in time. Previously, she experienced frequent accidents during trips, such as traveling from her home to [place], but this has improved significantly. Occasionally, she experiences incomplete evacuation, leading to 'sliders', but these incidents are less frequent.  She has a history of passing small, hard stools associated with constipation. When this occurs, she may need to increase her fiber intake or use a laxative to prevent backup. No nausea, vomiting, or heartburn  recently.  She has arthritis with pain primarily on the left side, affecting her hip bones. After surgical removal of parts of her intestines, she sometimes feels her rib bones resting on her hip bones due to a lack of cushioning. She has experienced a decrease in height over the years, from 5'8 to 5'0.25.  She is currently taking Lynparza , which she will continue for another year. She reports mild side effects such as weakness and occasional dizziness but no severe adverse effects. Her kidney function is stable, with a creatinine level of 1.37 and a GFR of 39, which she monitors regularly due to having only one kidney.  Denies having any nausea, vomiting, heartburn, regurgitation, odynophagia or dysphagia.  Previous CT did show gastric wall thickening which could have been likely due to underdistention.  Patient is aware.  She wants to hold off on EGD as she is not having any upper GI symptoms.  She is scheduled to have repeat CT in October 2025.  History of Present Illness   Wt Readings from Last 3 Encounters:  11/16/23 153 lb 2 oz (69.5 kg)  11/15/23 154 lb 9.6 oz (70.1 kg)  10/28/23 152 lb 12.8 oz (69.3 kg)      Past GI [facility] Colonoscopy 12/08/2020 (April Weber) - Non- bleeding internal hemorrhoids. - Severe diverticulosis.  - One 1 mm polyp at the hepatic flexure, removed with a cold snare. Resected and retrieved.  - The examination was otherwise normal on direct and retroflexion views.  No need to repeat.   Diagnosis Surgical [P], colon, hepatic flexure, polyp (1) - TUBULAR ADENOMA.  CT A/P 03/18/2023 IMPRESSION:  1. Interval left hemicolectomy with rectosigmoid anastomosis. 2. 11 mm subcutaneous nodule anterior right abdominal wall is new in the interval. This is nonspecific and may be related to recent injection. Close follow-up recommended. 3. No findings of recurrent or metastatic disease on the current exam. 4.  Aortic Atherosclerosis (ICD10-I70.0).   Most recent CT  A/P 07/21/2023 for ovarian cancer restaging IMPRESSION: 1. No acute process or evidence of metastatic disease in the abdomen or pelvis. The anterior right pelvic wall nodule on the prior exam is resolved. 2. Marked left renal atrophy.  Possible left nephrolithiasis. 3. Apparent gastric antral wall thickening could be due to underdistention or gastritis. 4.  Aortic Atherosclerosis (ICD10-I70.0) Past Medical History:  Diagnosis Date   Acute diverticulitis 06/13/2022   Arthritis    Breast cancer (HCC) 1980   RIGHT   Chronic kidney disease    has one kidney   Diverticulitis of colon    DVT (deep venous thrombosis) (HCC)    on RIGHT leg-hx of, superficial   Dyspnea    Headache    occ migraine   Hyperlipidemia    on meds   Hypertension    on meds   Irritant contact dermatitis associated with fecal stoma 09/28/2022   Neutropenic fever (HCC) 06/15/2022   Osteopenia    Ovarian cancer (HCC) 03/2022   Peripheral vascular disease (HCC)    PONV (postoperative nausea and vomiting)    Seasonal allergies    Thyroid  disease    on meds    Past Surgical History:  Procedure Laterality Date   ABDOMINAL HYSTERECTOMY     APPENDECTOMY     BLADDER REPAIR  09/01/2022   Procedure: REPAIR BLADDER FISTULA WITH HYDRODISTENTION WITH METHYLINE BLUE;  Surgeon: April Glance, MD;  Location: [facility];  Service: Urology;;   BREAST BIOPSY     BREAST LUMPECTOMY WITH RADIOACTIVE SEED LOCALIZATION Left 12/01/2021   Procedure: LEFT BREAST RADIOACTIVE SEED GUIDED LUMPECTOMY;  Surgeon: April Berg, MD;  Location: [facility];  Service: General;  Laterality: Left;  LMA   COLON RESECTION N/A 09/01/2022   Procedure: LAPAROSCOPIC SIGMOIDECTOMY CONVERTED TO OPEN, TAKEDOWN OF COLOVESICAL FISTULA AND LOOP COLOSTOMY;  Surgeon: April Herlene Righter, MD;  Location: [facility];  Service: General;  Laterality: N/A;   COLONOSCOPY  2011   April Weber-normal   CYSTOSCOPY WITH STENT PLACEMENT  09/01/2022    Procedure: CYSTOSCOPY WITH BILATERAL URETERAL STENT PLACEMENTS;  Surgeon: April Glance, MD;  Location: [facility];  Service: Urology;;   DEBULKING N/A 03/30/2022   Procedure: TUMOR DEBULKING;  Surgeon: April Comer SAUNDERS, MD;  Location: [facility];  Service: Gynecology;  Laterality: N/A;   EYE SURGERY     78 years old   ILEOSTOMY CLOSURE N/A 01/11/2023   Procedure: LOOP ILEOSTOMY REVERSAL;  Surgeon: April Herlene Righter, MD;  Location: [facility];  Service: General;  Laterality: N/A;   IR IMAGING GUIDED PORT INSERTION  03/19/2022   IR REMOVAL TUN ACCESS W/ PORT W/O FL MOD SED  11/15/2022   LAPAROSCOPY N/A 03/30/2022   Procedure: LAPAROSCOPY DIAGNOSTIC;  Surgeon: April Comer SAUNDERS, MD;  Location: [facility];  Service: Gynecology;  Laterality: N/A;   MASTECTOMY Right 1984   ROTATOR CUFF REPAIR     SALPINGOOPHORECTOMY Bilateral 03/30/2022   Procedure: OPEN SALPINGO OOPHORECTOMY;  Surgeon: April Comer SAUNDERS, MD;  Location: [facility];  Service: Gynecology;  Laterality: Bilateral;   THYROID  SURGERY Bilateral 1967   VARICOSE VEIN SURGERY     ablation   WISDOM TOOTH EXTRACTION  Family History  Problem Relation Age of Onset   Heart attack Mother    Alcohol abuse Mother    Lung cancer Mother        d. mid 37s   Breast cancer Maternal Aunt 42   Colon polyps Neg Hx    Colon cancer Neg Hx    Esophageal cancer Neg Hx    Stomach cancer Neg Hx    Rectal cancer Neg Hx     Social History   Tobacco Use   Smoking status: Former    Current packs/day: 0.00    Types: Cigarettes    Quit date: 05/11/1967    Years since quitting: 56.5    Passive exposure: Past   Smokeless tobacco: Never  Vaping Use   Vaping status: Never Used  Substance Use Topics   Alcohol use: No   Drug use: No    Current Outpatient Medications  Medication Sig Dispense Refill   acetaminophen  (TYLENOL ) 500 MG tablet Take 2 tablets (1,000 mg total) by mouth every 6 (six) hours as needed for mild pain. 30 tablet 0   amLODipine   (NORVASC ) 5 MG tablet Take 1 tablet (5 mg total) by mouth daily. 90 tablet 3   aspirin  EC 81 MG tablet Take 81 mg by mouth at bedtime. Swallow whole.     atenolol  (TENORMIN ) 25 MG tablet Take 0.5 tablets (12.5 mg total) by mouth 2 (two) times daily. 90 tablet 3   Calcium  Carb-Cholecalciferol  (CALCIUM  + VITAMIN D3 PO) Take 1 tablet by mouth 2 (two) times daily.     levothyroxine  (SYNTHROID ) 100 MCG tablet Take 1 tablet (100 mcg total) by mouth in the morning. 90 tablet 3   loratadine  (CLARITIN ) 10 MG tablet Take 10 mg by mouth daily.     Multiple Vitamin (MULTIVITAMIN) tablet Take 1 tablet by mouth in the morning.     olaparib  (LYNPARZA ) 100 MG tablet Take 1 tablet (100 mg total) by mouth 2 (two) times daily. Swallow whole. May take with food to decrease nausea and vomiting. 60 tablet 11   simvastatin  (ZOCOR ) 20 MG tablet Take 1 tablet (20 mg total) by mouth daily. 90 tablet 3   triamcinolone  (NASACORT ) 55 MCG/ACT AERO nasal inhaler Place 2 sprays into the nose daily.     No current facility-administered medications for this visit.    Allergies  Allergen Reactions   Paclitaxel  Anaphylaxis   Emend [Fosaprepitant  Dimeglumine] Other (See Comments)    3 minutes into emend infusion patient began having facial flushing and redness to bilateral hands and neck, and light headedness   Codeine Nausea And Vomiting   Nsaids Other (See Comments)    Told to avoid due to kidney function   Penicillins Hives   Vibra-Tab [Doxycycline] Hives   Iodine  Rash   Latex Rash    Occasionally gets a localized rash on contact with certain latex products    Review of Systems:  neg     Physical Exam:    BP 120/60 (BP Location: Left Arm, Patient Position: Sitting, Cuff Size: Normal)   Pulse 64   Ht 5' 5.25 (1.657 m) Comment: height measured without shoes  Wt 153 lb 2 oz (69.5 kg)   BMI 25.29 kg/m  Wt Readings from Last 3 Encounters:  11/16/23 153 lb 2 oz (69.5 kg)  11/15/23 154 lb 9.6 oz (70.1 kg)   10/28/23 152 lb 12.8 oz (69.3 kg)   Constitutional:  Well-developed, in no acute distress. Psychiatric: Normal mood and affect. Behavior is normal.  HEENT: Pupils normal.  Conjunctivae are normal. No scleral icterus. Neck supple.  Cardiovascular: Normal rate, regular rhythm. No edema Pulmonary/chest: Effort normal and breath sounds normal. No wheezing, rales or rhonchi. Abdominal: Soft, nondistended. Nontender. Bowel sounds active throughout. There are no masses palpable. No hepatomegaly. Rectal: Deferred Neurological: Alert and oriented to person place and time. Skin: Skin is warm and dry. No rashes noted.  Data Reviewed: I have personally reviewed following labs and imaging studies  CBC:    Latest Ref Rng & Units 11/15/2023   10:05 AM 09/15/2023    9:53 AM 07/14/2023    9:47 AM  CBC  WBC 4.0 - 10.5 K/uL 6.9  7.4  6.9   Hemoglobin 12.0 - 15.0 g/dL 86.9  86.8  87.6   Hematocrit 36.0 - 46.0 % 36.9  37.1  34.6   Platelets 150 - 400 K/uL 230  236  216     CMP:    Latest Ref Rng & Units 11/15/2023   10:05 AM 10/10/2023    8:55 AM 09/15/2023    9:53 AM  CMP  Glucose 70 - 99 mg/dL 90  898  83   BUN 8 - 23 mg/dL 18  20  21    Creatinine 0.44 - 1.00 mg/dL 8.62  8.56  8.52   Sodium 135 - 145 mmol/L 137  139  136   Potassium 3.5 - 5.1 mmol/L 3.9  4.5  3.8   Chloride 98 - 111 mmol/L 102  101  99   CO2 22 - 32 mmol/L 28  19  27    Calcium  8.9 - 10.3 mg/dL 9.4  9.6  9.3   Total Protein 6.5 - 8.1 g/dL 6.8  6.7  6.8   Total Bilirubin 0.0 - 1.2 mg/dL 0.6  0.5  0.6   Alkaline Phos 38 - 126 U/L 59  67  60   AST 15 - 41 U/L 20  22  21    ALT 0 - 44 U/L 16  16  16         Radiology Studies: DG Abd 2 Views Result Date: 10/25/2023 CLINICAL DATA:  Follow-up chronic constipation EXAM: ABDOMEN - 2 VIEW COMPARISON:  09/20/2023 FINDINGS: Nonobstructed gas pattern with moderate stool burden. Postsurgical changes in the pelvis. Scoliosis and degenerative changes. IMPRESSION: Nonobstructed gas pattern with  moderate stool burden. Electronically Signed   By: Luke Bun M.D.   On: 10/25/2023 22:42      Anselm Bring, MD 11/16/2023, 9:02 AM  Cc: April Norene HERO, DO

## 2023-11-16 NOTE — Patient Instructions (Signed)
 _______________________________________________________  If your blood pressure at your visit was 140/90 or greater, please contact your primary care physician to follow up on this.  _______________________________________________________  If you are age 79 or older, your body mass index should be between 23-30. Your Body mass index is 25.29 kg/m. If this is out of the aforementioned range listed, please consider follow up with your Primary Care Provider.  If you are age 75 or younger, your body mass index should be between 19-25. Your Body mass index is 25.29 kg/m. If this is out of the aformentioned range listed, please consider follow up with your Primary Care Provider.   ________________________________________________________  The [facility] providers would like to encourage you to use MYCHART to communicate with providers for non-urgent requests or questions.  Due to long hold times on the telephone, sending your provider a message by [facility] may be a faster and more efficient way to get a response.  Please allow 48 business hours for a response.  Please remember that this is for non-urgent requests.  _______________________________________________________  Continue high fiber diet  Follow up as needed. Call with any questions or concerns.

## 2023-11-18 ENCOUNTER — Other Ambulatory Visit: Payer: Self-pay

## 2023-11-22 ENCOUNTER — Other Ambulatory Visit: Payer: Self-pay

## 2023-11-22 NOTE — Progress Notes (Signed)
 Specialty Pharmacy Refill Coordination Note  April Weber is a 78 y.o. female contacted today regarding refills of specialty medication(s) Olaparib  (LYNPARZA )   Patient requested Delivery   Delivery date: 11/25/23   Verified address: [address]  [name] [id number]   Medication will be filled on 11/24/23.

## 2023-11-22 NOTE — Progress Notes (Signed)
 Specialty Pharmacy Ongoing Clinical Assessment Note  April Weber is a 78 y.o. female who is being followed by the specialty pharmacy service for RxSp Oncology   Patient's specialty medication(s) reviewed today: Olaparib  (LYNPARZA )   Missed doses in the last 4 weeks: 0   Patient/Caregiver did not have any additional questions or concerns.   Therapeutic benefit summary: Patient is achieving benefit   Adverse events/side effects summary: Experienced adverse events/side effects (intermittent, tolerable fatigue and dizziness)   Patient's therapy is appropriate to: Continue    Goals Addressed             This Visit's Progress    Maintain optimal adherence to therapy   On track    Patient is on track. Patient will maintain adherence         Follow up: 3 months  Darick Fetters M [place] Lusty Specialty Pharmacist

## 2023-11-23 ENCOUNTER — Other Ambulatory Visit: Payer: Self-pay

## 2023-12-16 ENCOUNTER — Other Ambulatory Visit: Payer: Self-pay

## 2023-12-16 NOTE — Progress Notes (Signed)
 Specialty Pharmacy Refill Coordination Note  April Weber is a 78 y.o. female contacted today regarding refills of specialty medication(s) Olaparib  (LYNPARZA )   Patient requested Delivery   Delivery date: 12/26/23   Verified address: [address]  [place] [phone]   Medication will be filled on 08.15.25.

## 2023-12-23 ENCOUNTER — Other Ambulatory Visit: Payer: Self-pay

## 2024-01-05 ENCOUNTER — Telehealth: Payer: Self-pay | Admitting: Family Medicine

## 2024-01-05 NOTE — Telephone Encounter (Signed)
 Empty call

## 2024-01-13 ENCOUNTER — Other Ambulatory Visit (HOSPITAL_COMMUNITY): Payer: Self-pay

## 2024-01-17 ENCOUNTER — Other Ambulatory Visit: Payer: Self-pay

## 2024-01-17 NOTE — Progress Notes (Signed)
 Specialty Pharmacy Refill Coordination Note  April Weber is a 78 y.o. female contacted today regarding refills of specialty medication(s) Olaparib  (LYNPARZA )   Patient requested Delivery   Delivery date: 01/23/24   Verified address: [address]  [place] [phone]   Medication will be filled on 01/20/24.

## 2024-01-19 ENCOUNTER — Other Ambulatory Visit: Payer: Self-pay

## 2024-01-26 ENCOUNTER — Ambulatory Visit (INDEPENDENT_AMBULATORY_CARE_PROVIDER_SITE_OTHER): Payer: Medicare Other

## 2024-01-26 VITALS — BP 120/64 | HR 64 | Ht 65.0 in | Wt 153.0 lb

## 2024-01-26 DIAGNOSIS — Z0001 Encounter for general adult medical examination with abnormal findings: Secondary | ICD-10-CM | POA: Diagnosis not present

## 2024-01-26 DIAGNOSIS — E890 Postprocedural hypothyroidism: Secondary | ICD-10-CM

## 2024-01-26 DIAGNOSIS — Z0000 Encounter for general adult medical examination without abnormal findings: Secondary | ICD-10-CM

## 2024-01-26 NOTE — Patient Instructions (Signed)
 April Weber,  Thank you for taking the time for your Medicare Wellness Visit. I appreciate your continued commitment to your health goals. Please review the care plan we discussed, and feel free to reach out if I can assist you further.  Medicare recommends these wellness visits once per year to help you and your care team stay ahead of potential health issues. These visits are designed to focus on prevention, allowing your provider to concentrate on managing your acute and chronic conditions during your regular appointments.  Please note that Annual Wellness Visits do not include a physical exam. Some assessments may be limited, especially if the visit was conducted virtually. If needed, we may recommend a separate in-person follow-up with your provider.  Ongoing Care Seeing your primary care provider every 3 to 6 months helps us  monitor your health and provide consistent, personalized care.   Referrals If a referral was made during today's visit and you haven't received any updates within two weeks, please contact the referred provider directly to check on the status.  Recommended Screenings:  Health Maintenance  Topic Date Due   Zoster (Shingles) Vaccine (1 of 2) 10/14/1964   Breast Cancer Screening  10/12/2022   Flu Shot  12/09/2023   COVID-19 Vaccine (8 - 2025-26 season) 01/09/2024   Medicare Annual Wellness Visit  01/25/2024   DEXA scan (bone density measurement)  12/01/2024   DTaP/Tdap/Td vaccine (4 - Td or Tdap) 01/08/2029   Colon Cancer Screening  12/09/2030   Pneumococcal Vaccine for age over 63  Completed   Hepatitis C Screening  Completed   HPV Vaccine  Aged Out   Meningitis B Vaccine  Aged Out       01/26/2024    9:45 AM  Advanced Directives  Does Patient Have a Medical Advance Directive? Yes  Type of Estate agent of [place];Living will  Copy of Healthcare Power of Attorney in Chart? Yes - validated most recent copy scanned in chart (See row  information)   Advance Care Planning is important because it: Ensures you receive medical care that aligns with your values, goals, and preferences. Provides guidance to your family and loved ones, reducing the emotional burden of decision-making during critical moments.  Vision: Annual vision screenings are recommended for early detection of glaucoma, cataracts, and diabetic retinopathy. These exams can also reveal signs of chronic conditions such as diabetes and high blood pressure.  Dental: Annual dental screenings help detect early signs of oral cancer, gum disease, and other conditions linked to overall health, including heart disease and diabetes.  Please see the attached documents for additional preventive care recommendations.

## 2024-01-26 NOTE — Progress Notes (Signed)
 Subjective:   April Weber is a 78 y.o. who presents for a Medicare Wellness preventive visit.  As a reminder, Annual Wellness Visits don't include a physical exam, and some assessments may be limited, especially if this visit is performed virtually. We may recommend an in-person follow-up visit with your provider if needed.  Visit Complete: Virtual I connected with  April Weber on 01/26/24 by a audio enabled telemedicine application and verified that I am speaking with the correct person using two identifiers.  Patient Location: Home  Provider Location: Home Office  I discussed the limitations of evaluation and management by telemedicine. The patient expressed understanding and agreed to proceed.  Vital Signs: Because this visit was a virtual/telehealth visit, some criteria may be missing or patient reported. Any vitals not documented were not able to be obtained and vitals that have been documented are patient reported.  VideoDeclined- This patient declined Librarian, academic. Therefore the visit was completed with audio only.  Persons Participating in Visit: Patient.  AWV Questionnaire: No: Patient Medicare AWV questionnaire was not completed prior to this visit.  Cardiac Risk Factors include: advanced age (>62men, >9 women);dyslipidemia;hypertension     Objective:    Today's Vitals   01/26/24 0920  BP: 120/64  Pulse: 64  Weight: 153 lb (69.4 kg)  Height: 5' 5 (1.651 m)   Body mass index is 25.46 kg/m.     01/26/2024    9:45 AM 05/06/2023   12:04 PM 01/25/2023    9:03 AM 01/11/2023   11:21 AM 12/29/2022    9:19 AM 08/27/2022   12:31 PM 08/27/2022    7:45 AM  Advanced Directives  Does Patient Have a Medical Advance Directive? Yes No Yes Yes Yes No No  Type of Estate agent of [place];Living will  Healthcare Power of [place];Living will Healthcare Power of [place];Living will Healthcare Power of  [place];Living will    Does patient want to make changes to medical advance directive?   No - Patient declined No - Patient declined     Copy of Healthcare Power of Attorney in Chart? Yes - validated most recent copy scanned in chart (See row information)  Yes - validated most recent copy scanned in chart (See row information) Yes - validated most recent copy scanned in chart (See row information) Yes - validated most recent copy scanned in chart (See row information)    Would patient like information on creating a medical advance directive?      No - Patient declined No - Patient declined    Current Medications (verified) Outpatient Encounter Medications as of 01/26/2024  Medication Sig   acetaminophen  (TYLENOL ) 500 MG tablet Take 2 tablets (1,000 mg total) by mouth every 6 (six) hours as needed for mild pain.   amLODipine  (NORVASC ) 5 MG tablet Take 1 tablet (5 mg total) by mouth daily.   aspirin  EC 81 MG tablet Take 81 mg by mouth at bedtime. Swallow whole.   atenolol  (TENORMIN ) 25 MG tablet Take 0.5 tablets (12.5 mg total) by mouth 2 (two) times daily.   Calcium  Carb-Cholecalciferol  (CALCIUM  + VITAMIN D3 PO) Take 1 tablet by mouth 2 (two) times daily.   levothyroxine  (SYNTHROID ) 100 MCG tablet Take 1 tablet (100 mcg total) by mouth in the morning.   loratadine  (CLARITIN ) 10 MG tablet Take 10 mg by mouth daily.   Multiple Vitamin (MULTIVITAMIN) tablet Take 1 tablet by mouth in the morning.   olaparib  (LYNPARZA ) 100 MG tablet  Take 1 tablet (100 mg total) by mouth 2 (two) times daily. Swallow whole. May take with food to decrease nausea and vomiting.   simvastatin  (ZOCOR ) 20 MG tablet Take 1 tablet (20 mg total) by mouth daily.   triamcinolone  (NASACORT ) 55 MCG/ACT AERO nasal inhaler Place 2 sprays into the nose daily.   No facility-administered encounter medications on file as of 01/26/2024.    Allergies (verified) Paclitaxel , Emend [fosaprepitant  dimeglumine], Codeine, Nsaids, Penicillins,  Vibra-tab [doxycycline], Iodine , and Latex   History: Past Medical History:  Diagnosis Date   Acute diverticulitis 06/13/2022   Arthritis    Breast cancer (HCC) 1980   RIGHT   Chronic kidney disease    has one kidney   Diverticulitis of colon    DVT (deep venous thrombosis) (HCC)    on RIGHT leg-hx of, superficial   Dyspnea    Headache    occ migraine   Hyperlipidemia    on meds   Hypertension    on meds   Irritant contact dermatitis associated with fecal stoma 09/28/2022   Neutropenic fever (HCC) 06/15/2022   Osteopenia    Ovarian cancer (HCC) 03/2022   Peripheral vascular disease (HCC)    PONV (postoperative nausea and vomiting)    Seasonal allergies    Thyroid  disease    on meds   Past Surgical History:  Procedure Laterality Date   ABDOMINAL HYSTERECTOMY     APPENDECTOMY     BLADDER REPAIR  09/01/2022   Procedure: REPAIR BLADDER FISTULA WITH HYDRODISTENTION WITH METHYLINE BLUE;  Surgeon: Renda Glance, MD;  Location: [facility];  Service: Urology;;   BREAST BIOPSY     BREAST LUMPECTOMY WITH RADIOACTIVE SEED LOCALIZATION Left 12/01/2021   Procedure: LEFT BREAST RADIOACTIVE SEED GUIDED LUMPECTOMY;  Surgeon: Vernetta Berg, MD;  Location: [facility];  Service: General;  Laterality: Left;  LMA   COLON RESECTION N/A 09/01/2022   Procedure: LAPAROSCOPIC SIGMOIDECTOMY CONVERTED TO OPEN, TAKEDOWN OF COLOVESICAL FISTULA AND LOOP COLOSTOMY;  Surgeon: Stevie Herlene Righter, MD;  Location: [facility];  Service: General;  Laterality: N/A;   COLONOSCOPY  2011   Dr.[email or web address]-[email or web address]   CYSTOSCOPY WITH STENT PLACEMENT  09/01/2022   Procedure: CYSTOSCOPY WITH BILATERAL URETERAL STENT PLACEMENTS;  Surgeon: Renda Glance, MD;  Location: [facility];  Service: Urology;;   DEBULKING N/A 03/30/2022   Procedure: TUMOR DEBULKING;  Surgeon: Viktoria Comer SAUNDERS, MD;  Location: [facility];  Service: Gynecology;  Laterality: N/A;   EYE SURGERY     78 years old   ILEOSTOMY CLOSURE N/A 01/11/2023    Procedure: LOOP ILEOSTOMY REVERSAL;  Surgeon: Stevie Herlene Righter, MD;  Location: [facility];  Service: General;  Laterality: N/A;   IR IMAGING GUIDED PORT INSERTION  03/19/2022   IR REMOVAL TUN ACCESS W/ PORT W/O FL MOD SED  11/15/2022   LAPAROSCOPY N/A 03/30/2022   Procedure: LAPAROSCOPY DIAGNOSTIC;  Surgeon: Viktoria Comer SAUNDERS, MD;  Location: [facility];  Service: Gynecology;  Laterality: N/A;   MASTECTOMY Right 1984   ROTATOR CUFF REPAIR     SALPINGOOPHORECTOMY Bilateral 03/30/2022   Procedure: OPEN SALPINGO OOPHORECTOMY;  Surgeon: Viktoria Comer SAUNDERS, MD;  Location: [facility];  Service: Gynecology;  Laterality: Bilateral;   THYROID  SURGERY Bilateral 1967   VARICOSE VEIN SURGERY     ablation   WISDOM TOOTH EXTRACTION     Family History  Problem Relation Age of Onset   Heart attack Mother    Alcohol abuse Mother    Lung cancer Mother  d. 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  Tobacco Use   Smoking status: Former    Current packs/day: 0.00    Types: Cigarettes    Quit date: 05/11/1967    Years since quitting: 56.7    Passive exposure: Past   Smokeless tobacco: Never  Vaping Use   Vaping status: Never Used  Substance and Sexual Activity   Alcohol use: No   Drug use: No   Sexual activity: Not Currently    Birth control/protection: Post-menopausal, Surgical    Comment: hyst  Other Topics Concern   Not on file  Social History Narrative   Lives alone - very close with her neighbors   Social Drivers of Corporate investment banker Strain:  Low Risk  (01/26/2024)   Overall Financial Resource Strain (CARDIA)    Difficulty of Paying Living Expenses: Not hard at all  Food Insecurity: No Food Insecurity (01/26/2024)   Hunger Vital Sign    Worried About Running Out of Food in the Last Year: Never true    Ran Out of Food in the Last Year: Never true  Transportation Needs: No Transportation Needs (01/26/2024)   PRAPARE - Administrator, Civil Service (Medical): No    Lack of Transportation (Non-Medical): No  Physical Activity: Sufficiently Active (01/26/2024)   Exercise Vital Sign    Days of Exercise per Week: 7 days    Minutes of Exercise per Session: 30 min  Stress: No Stress Concern Present (01/26/2024)   [employer] of Occupational Health - Occupational Stress Questionnaire    Feeling of Stress: Not at all  Social Connections: Unknown (01/26/2024)   Social Connection and Isolation Panel    Frequency of Communication with Friends and Family: More than three times a week    Frequency of Social Gatherings with Friends and Family: More than three times a week    Attends Religious Services: More than 4 times per year    Active Member of [employer] or Organizations: Yes    Attends Engineer, structural: More than 4 times per year    Marital Status: Patient declined    Tobacco Counseling Counseling given: Yes    Clinical Intake:  Pre-visit preparation completed: Yes  Pain : No/denies pain     BMI - recorded: 25.46 Nutritional Status: BMI 25 -29 Overweight Nutritional Risks: None Diabetes: No  Lab Results  Component Value Date   HGBA1C 5.6 08/31/2022   HGBA1C 5.3% 10/08/2013   HGBA1C 5.8 (H) 08/08/2013     How often do you need to have someone help you when you read instructions, pamphlets, or other written materials from your doctor or pharmacy?: 1 - Never  Interpreter Needed?: No  Information entered by :: alia t/cma   Activities of Daily Living     01/26/2024    9:39 AM  In your  present state of health, do you have any difficulty  performing the following activities:  Hearing? 0  Vision? 0  Difficulty concentrating or making decisions? 0  Walking or climbing stairs? 0  Dressing or bathing? 0  Doing errands, shopping? 0  Preparing Food and eating ? N  Using the Toilet? N  In the past six months, have you accidently leaked urine? N  Do you have problems with loss of bowel control? N  Managing your Medications? N  Managing your Finances? N  Housekeeping or managing your Housekeeping? N    Patient Care Team: Jolinda Norene HERO, DO as PCP - General (Family Medicine) Rox Charleston, MD as Attending Physician (Obstetrics and Gynecology) Patty Anes, MD as Consulting Physician (Family Medicine) Vicci Mcardle, OD (Optometry) Care, [facility]) Lonn Hicks, MD as Consulting Physician (Hematology and Oncology) [name], Herlene Righter, MD as Consulting Physician (General Surgery) Ivonne Harlene RAMAN, RD as Dietitian (Dietician)  I have updated your Care Teams any recent Medical Services you may have received from other providers in the past year.     Assessment:   This is a routine wellness examination for April Weber.  Hearing/Vision screen Hearing Screening - Comments:: Pt denies hearing dif Vision Screening - Comments:: Pt wear glasses/pt goes [facility] in [place]   Goals Addressed             This Visit's Progress    Patient Stated   On track    Would like to stay healthy and active Goals Addressed             This Visit's Progress    Patient Stated       Would like to stay healthy and active              Depression Screen     01/26/2024    9:46 AM 10/18/2023    1:57 PM 04/04/2023    1:54 PM 01/25/2023    9:02 AM 11/30/2022    7:54 AM 06/01/2022    8:05 AM 05/24/2022    8:12 AM  PHQ 2/9 Scores  PHQ - 2 Score 0 0 0 0 0 0 0  PHQ- 9 Score  0 0 0 0 0 0    Fall Risk     01/26/2024    9:36 AM 10/18/2023     1:58 PM 04/04/2023    1:54 PM 01/25/2023    9:01 AM 11/30/2022    7:54 AM  Fall Risk   Falls in the past year? 0 0 0 0 0  Number falls in past yr: 0 0 0 0 0  Injury with Fall? 1 0 0 0 0  Risk for fall due to : Impaired balance/gait;Impaired mobility No Fall Risks No Fall Risks No Fall Risks No Fall Risks  Follow up Falls evaluation completed Falls evaluation completed Education provided Falls prevention discussed Education provided    MEDICARE RISK AT HOME:  Medicare Risk at Home Any stairs in or around the home?: No If so, are there any without handrails?: No Home free of loose throw rugs in walkways, pet beds, electrical cords, etc?: Yes Adequate lighting in your home to reduce risk of falls?: Yes Life alert?: No Use of a cane, walker or w/c?: No Grab bars in the bathroom?: Yes Shower chair or bench in shower?: No Elevated toilet seat or a handicapped toilet?: No  TIMED UP AND GO:  Was the test performed?  no  Cognitive Function: 6CIT completed    08/14/2014    2:42 PM  MMSE -  Mini Mental State Exam  Orientation to time 5   Orientation to Place 5   Registration 3   Attention/ Calculation 5   Recall 3   Language- name 2 objects 2   Language- repeat 1  Language- follow 3 step command 3   Language- read & follow direction 1   Write a sentence 1   Copy design 1   Total score 30      Data saved with a previous flowsheet row definition        01/26/2024    9:53 AM 01/25/2023    9:04 AM 08/26/2021    2:48 PM  6CIT Screen  What Year? 0 points 0 points 0 points  What month? 0 points 0 points 0 points  What time? 0 points 0 points 0 points  Count back from 20 0 points 0 points 0 points  Months in reverse 0 points 0 points 0 points  Repeat phrase 0 points 0 points 0 points  Total Score 0 points 0 points 0 points    Immunizations Immunization History  Administered Date(s) Administered   Fluad Quad(high Dose 65+) 02/09/2019, 02/13/2020, 02/18/2021, 02/23/2022    INFLUENZA, HIGH DOSE SEASONAL PF 02/12/2015, 03/05/2016, 03/01/2017, 02/17/2018   Influenza Whole 02/16/2010   Influenza,inj,Quad PF,6+ Mos 02/13/2013, 02/13/2014   Moderna Sars-Covid-2 Vaccination 06/15/2019, 07/14/2019, 03/11/2020, 08/19/2020, 01/27/2021   Pfizer(Comirnaty)Fall Seasonal Vaccine 12 years and older 02/16/2022   Pneumococcal Conjugate-13 08/14/2014   Pneumococcal Polysaccharide-23 10/24/2009   Td 01/09/2019, 01/09/2019   Tdap 08/28/2008   Unspecified SARS-COV-2 Vaccination 08/19/2020   Zoster, Live 03/23/2007    Screening Tests Health Maintenance  Topic Date Due   Zoster Vaccines- Shingrix (1 of 2) 10/14/1964   Mammogram  10/12/2022   Influenza Vaccine  12/09/2023   COVID-19 Vaccine (8 - 2025-26 season) 01/09/2024   DEXA SCAN  12/01/2024   Medicare Annual Wellness (AWV)  01/25/2025   DTaP/Tdap/Td (4 - Td or Tdap) 01/08/2029   Colonoscopy  12/09/2030   Pneumococcal Vaccine: 50+ Years  Completed   Hepatitis C Screening  Completed   HPV VACCINES  Aged Out   Meningococcal B Vaccine  Aged Out    Health Maintenance Items Addressed: See Nurse Notes at the end of this note  Additional Screening:  Vision Screening: Recommended annual ophthalmology exams for early detection of glaucoma and other disorders of the eye. Is the patient up to date with their annual eye exam?  Yes  Who is the provider or what is the name of the office in which the patient attends annual eye exams? [facility]   Dental Screening: Recommended annual dental exams for proper oral hygiene  Community Resource Referral / Chronic Care Management: CRR required this visit?  No   CCM required this visit?  No   Plan:    I have personally reviewed and noted the following in the patient's chart:   Medical and social history Use of alcohol, tobacco or illicit drugs  Current medications and supplements including opioid prescriptions. Patient is not currently taking opioid  prescriptions. Functional ability and status Nutritional status Physical activity Advanced directives List of other physicians Hospitalizations, surgeries, and ER visits in previous 12 months Vitals Screenings to include cognitive, depression, and falls Referrals and appointments  In addition, I have reviewed and discussed with patient certain preventive protocols, quality metrics, and best practice recommendations. A written personalized care plan for preventive services as well as general preventive health recommendations were provided to patient.  Ozie Ned, CMA   01/26/2024   After Visit Summary: (MyChart) Due to this being a telephonic visit, the after visit summary with patients personalized plan was offered to patient via MyChart   Notes: PCP Follow Up Recommendations: Pt is aware and due the following: flu/shingles vaccine. Pt decline shingles vaccine. P will get mammogram done in 10/25

## 2024-02-07 ENCOUNTER — Other Ambulatory Visit: Payer: Self-pay

## 2024-02-07 NOTE — Progress Notes (Signed)
 Specialty Pharmacy Ongoing Clinical Assessment Note  April Weber is a 78 y.o. female who is being followed by the specialty pharmacy service for RxSp Oncology   Patient's specialty medication(s) reviewed today: Olaparib  (LYNPARZA )   Missed doses in the last 4 weeks: 0   Patient/Caregiver did not have any additional questions or concerns.   Therapeutic benefit summary: Patient is achieving benefit   Adverse events/side effects summary: Experienced adverse events/side effects (fatigue, dizziness, and dry/chapped lips, all are tolerable at this time)   Patient's therapy is appropriate to: Continue    Goals Addressed             This Visit's Progress    Maintain optimal adherence to therapy   On track    Patient is on track. Patient will maintain adherence         Follow up: 3 months  Silvano LOISE Dolly Specialty Pharmacist

## 2024-02-07 NOTE — Progress Notes (Signed)
 Specialty Pharmacy Refill Coordination Note  April Weber is a 78 y.o. female contacted today regarding refills of specialty medication(s) Olaparib  (LYNPARZA )   Patient requested Delivery   Delivery date: 02/15/24   Verified address: [address]  [place] [id number]   Medication will be filled on 02/14/24.

## 2024-02-09 ENCOUNTER — Telehealth: Payer: Self-pay

## 2024-02-09 ENCOUNTER — Other Ambulatory Visit: Payer: Self-pay | Admitting: Family Medicine

## 2024-02-09 DIAGNOSIS — Z1231 Encounter for screening mammogram for malignant neoplasm of breast: Secondary | ICD-10-CM

## 2024-02-09 NOTE — Telephone Encounter (Signed)
 Returned her call. Reviewed upcoming labs ordered for next week. She verbalized understanding.

## 2024-02-14 ENCOUNTER — Other Ambulatory Visit: Payer: Self-pay

## 2024-02-16 ENCOUNTER — Inpatient Hospital Stay: Attending: Gynecologic Oncology

## 2024-02-16 ENCOUNTER — Ambulatory Visit (HOSPITAL_COMMUNITY)
Admission: RE | Admit: 2024-02-16 | Discharge: 2024-02-16 | Disposition: A | Discharge status: Home / Self Care | Source: Ambulatory Visit | Attending: Hematology and Oncology | Admitting: Hematology and Oncology

## 2024-02-16 DIAGNOSIS — Z90722 Acquired absence of ovaries, bilateral: Secondary | ICD-10-CM | POA: Diagnosis not present

## 2024-02-16 DIAGNOSIS — Z9221 Personal history of antineoplastic chemotherapy: Secondary | ICD-10-CM | POA: Diagnosis not present

## 2024-02-16 DIAGNOSIS — Z79899 Other long term (current) drug therapy: Secondary | ICD-10-CM | POA: Diagnosis not present

## 2024-02-16 DIAGNOSIS — C561 Malignant neoplasm of right ovary: Secondary | ICD-10-CM | POA: Diagnosis not present

## 2024-02-16 DIAGNOSIS — N289 Disorder of kidney and ureter, unspecified: Secondary | ICD-10-CM | POA: Diagnosis not present

## 2024-02-16 DIAGNOSIS — N1831 Chronic kidney disease, stage 3a: Secondary | ICD-10-CM | POA: Insufficient documentation

## 2024-02-16 DIAGNOSIS — K7689 Other specified diseases of liver: Secondary | ICD-10-CM | POA: Diagnosis not present

## 2024-02-16 LAB — COMPREHENSIVE METABOLIC PANEL WITH GFR
ALT: 17 U/L (ref 0–44)
AST: 22 U/L (ref 15–41)
Albumin: 4.4 g/dL (ref 3.5–5.0)
Alkaline Phosphatase: 74 U/L (ref 38–126)
Anion gap: 8 (ref 5–15)
BUN: 20 mg/dL (ref 8–23)
CO2: 28 mmol/L (ref 22–32)
Calcium: 9.7 mg/dL (ref 8.9–10.3)
Chloride: 102 mmol/L (ref 98–111)
Creatinine, Ser: 1.56 mg/dL — ABNORMAL HIGH (ref 0.44–1.00)
GFR, Estimated: 34 mL/min — ABNORMAL LOW (ref >60)
Glucose, Bld: 79 mg/dL (ref 70–99)
Potassium: 3.7 mmol/L (ref 3.5–5.1)
Sodium: 138 mmol/L (ref 135–145)
Total Bilirubin: 0.7 mg/dL (ref 0.0–1.2)
Total Protein: 7.2 g/dL (ref 6.5–8.1)

## 2024-02-16 LAB — CBC WITH DIFFERENTIAL/PLATELET
Abs Immature Granulocytes: 0.04 K/uL (ref 0.00–0.07)
Basophils Absolute: 0.1 K/uL (ref 0.0–0.1)
Basophils Relative: 1 %
Eosinophils Absolute: 0.2 K/uL (ref 0.0–0.5)
Eosinophils Relative: 2 %
HCT: 38.1 % (ref 36.0–46.0)
Hemoglobin: 13.5 g/dL (ref 12.0–15.0)
Immature Granulocytes: 0 %
Lymphocytes Relative: 9 %
Lymphs Abs: 0.9 K/uL (ref 0.7–4.0)
MCH: 34.1 pg — ABNORMAL HIGH (ref 26.0–34.0)
MCHC: 35.4 g/dL (ref 30.0–36.0)
MCV: 96.2 fL (ref 80.0–100.0)
Monocytes Absolute: 0.7 K/uL (ref 0.1–1.0)
Monocytes Relative: 8 %
Neutro Abs: 7.8 K/uL — ABNORMAL HIGH (ref 1.7–7.7)
Neutrophils Relative %: 80 %
Platelets: 251 K/uL (ref 150–400)
RBC: 3.96 MIL/uL (ref 3.87–5.11)
RDW: 13.1 % (ref 11.5–15.5)
WBC: 9.7 K/uL (ref 4.0–10.5)
nRBC: 0 % (ref 0.0–0.2)

## 2024-02-17 ENCOUNTER — Telehealth: Payer: Self-pay | Admitting: Gastroenterology

## 2024-02-17 LAB — CA 125: Cancer Antigen (CA) 125: 11.5 U/mL (ref 0.0–38.1)

## 2024-02-17 NOTE — Telephone Encounter (Signed)
 Patient advising she had a recent ct scan done. Requesting provider to review.

## 2024-02-17 NOTE — Telephone Encounter (Signed)
 Returned patient call. Patient understands as of today the CT has not been read. She stated that per conversation,  you asked that she let you know when she had a scan so you could look and see if there was a heavy stool burden.

## 2024-02-20 DIAGNOSIS — C569 Malignant neoplasm of unspecified ovary: Secondary | ICD-10-CM | POA: Diagnosis not present

## 2024-02-20 DIAGNOSIS — N270 Small kidney, unilateral: Secondary | ICD-10-CM | POA: Diagnosis not present

## 2024-02-20 DIAGNOSIS — N1832 Chronic kidney disease, stage 3b: Secondary | ICD-10-CM | POA: Diagnosis not present

## 2024-02-20 DIAGNOSIS — I129 Hypertensive chronic kidney disease with stage 1 through stage 4 chronic kidney disease, or unspecified chronic kidney disease: Secondary | ICD-10-CM | POA: Diagnosis not present

## 2024-02-21 ENCOUNTER — Encounter: Payer: Self-pay | Admitting: Gynecologic Oncology

## 2024-02-21 NOTE — Telephone Encounter (Signed)
 CT negative for any acute abnormalities.  No recurrence. I do see moderate amount of stool especially in the right colon  Suggest -Take MiraLAX  17 g p.o. twice daily until a large bowel movement, then once a day.  It may take several days RG

## 2024-02-23 ENCOUNTER — Encounter: Payer: Self-pay | Admitting: Gynecologic Oncology

## 2024-02-23 ENCOUNTER — Inpatient Hospital Stay: Admitting: Gynecologic Oncology

## 2024-02-23 VITALS — BP 128/72 | HR 68 | Temp 97.8°F | Resp 19 | Wt 156.0 lb

## 2024-02-23 DIAGNOSIS — C561 Malignant neoplasm of right ovary: Secondary | ICD-10-CM

## 2024-02-23 DIAGNOSIS — Z79899 Other long term (current) drug therapy: Secondary | ICD-10-CM | POA: Diagnosis not present

## 2024-02-23 DIAGNOSIS — Z9221 Personal history of antineoplastic chemotherapy: Secondary | ICD-10-CM | POA: Diagnosis not present

## 2024-02-23 DIAGNOSIS — Z90722 Acquired absence of ovaries, bilateral: Secondary | ICD-10-CM | POA: Diagnosis not present

## 2024-02-23 NOTE — Telephone Encounter (Signed)
 Spoke with patient regarding Dr Ira opinion of CT. Patient appreciative of call but insists her constipation is not bad and she will chose another laxative for occasional use if needed.

## 2024-02-23 NOTE — Patient Instructions (Signed)
 It was good to see you today.  I do not see or feel any evidence of cancer recurrence on your exam.  I will see you for follow-up in 4 months.  As always, if you develop any new and concerning symptoms before your next visit, please call to see me sooner.

## 2024-02-23 NOTE — Progress Notes (Signed)
 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 Large, mixed solid and cystic mass in the low pelvis, difficult to clearly delineate from adjacent ascites although at least 10.2 x 10.0 cm. This is highly concerning for primary ovarian malignancy and most likely arises from the right ovary, although may involve both ovaries. This may be further evaluated by contrast enhanced MRI of the pelvis if desired. 2. Large volume ascites throughout the abdomen and pelvis, presumed malignant. Stranding and nodularity of the omentum and peritoneum, particularly in the ventral abdomen and left upper quadrant, highly suspicious for peritoneal metastatic disease. 3. Cirrhotic morphology of the liver, which may contribute to ascites. 4. Severely atrophic left kidney, consistent with remote prior infectious, obstructive, or ischemic insult. 5. Coronary artery disease.   Aortic Atherosclerosis (ICD10-I70.0).   03/17/2022 Imaging   1. There is a large solid and cystic mass within the right adnexa which measures up to 9.5 cm in maximum dimension. Findings are concerning for a primary ovarian neoplasm. 2. Large volume of ascites within the abdomen and pelvis. Given the presence of peritoneal nodules on the CT from the prior day findings are concerning for malignant ascites.   03/19/2022 Procedure   CT-guided biopsy of omental mass.    03/19/2022 Procedure   Successful ultrasound-guided paracentesis yielding 4.2 liters of peritoneal fluid.   03/19/2022 Procedure   Successful placement of a left internal jugular approach power injectable Port-A-Cath. The catheter is ready for immediate use.   03/30/2022 Initial Diagnosis   Ovarian cancer (HCC)   03/30/2022 Pathology Results    A. RIGHT OVARY AND FALLOPIAN TUBE, SALPINGO OOPHORECTOMY: Poorly differentiated carcinoma immunohistochemically most consistent with a high grade serous carcinoma Tumor measures 10.7 x 10.6 x 4.6 cm Tumor involves ovarian and mesosalpinx serosal surfaces and right fallopian tube pT2a pN n/a  Note: The tumor consists of sheets and cords of high-grade nuclei with areas of necrosis and adjacent prominent stroma suggesting papillary cores the overall morphology is slightly unusual and not classic; therefore, a battery of immunohistochemical stains were performed to confirm the diagnosis and exclude a high-grade germ cell tumor.  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 Additionally there is nonspecific focal PLAP positivity; CD117 positivity and D2-40 positivity of uncertain clinical significance.  The tumor is negative for AFP, hCG, CD30, CD31, CK20, glypican-3, and OCT 3/4.  Dr. Belvie has peer reviewed the case and agrees with diagnosis. Dr. Reed has also initiated the workup of the case and agrees with the interpretation.  B. OMENTUM, RESECTION: Benign omentum Negative for tumor  C. RIGHT PELVIC SIDEWALL, BIOPSY: Benign mesothelium, fibrovascular stroma and adipose Negative for tumor  D. ANTERIOR CUL DE SAC, BIOPSY: Benign mesothelium Negative for tumor  E. LEFT PELVIC SIDEWALL, BIOPSY: Benign mesothelium and adipose Negative for tumor   ONCOLOGY TABLE:  OVARY or FALLOPIAN TUBE or PRIMARY PERITONEUM: Resection  Procedure: Right salpingo-oophorectomy Specimen Integrity: Grossly focally disrupted Tumor Site: Right ovary Tumor Size: 10.7 x 10.6 x 4.6 cm Histologic Type: Poorly differentiated carcinoma most consistent with a high-grade serous carcinoma Histologic Grade: High-grade Ovarian Surface Involvement: Surface involvement Fallopian Tube Surface Involvement:  Mesosalpinx serosal surface involvement and right fallopian tube involvement Implants (required  for advanced stage serous/seromucinous borderline tumors only): Not identified Other Tissue/ Organ Involvement: N/A Largest Extrapelvic Peritoneal Focus: N/A Peritoneal/Ascitic Fluid Involvement: N/A Chemotherapy Response Score (CRS): N/A, no known presurgical therapy Regional Lymph Nodes: N/A, no lymph nodes submitted Distant Metastasis:      Distant Site(s) Involved: N/A Pathologic Stage Classification (pTNM, AJCC 8th Edition): pT2a, pN N/A Ancillary Studies: Can be performed on physician request Representative Tumor Block: A8 Comment(s): [None]    03/30/2022 Surgery   Preop Diagnosis: Pelvic mass, malignant ascites   Postoperative Diagnosis: at least stage IIB ovarian cancer, suspected high grade serous by frozen section   Surgery: Diagnostic laparoscopy, right salpingo-oophorectomy, peritoneal biopsies, infra-colic omentectomy    Surgeon:  Viktoria Bear MD    Operative findings: On exam under anesthesia, somewhat mobile mass appreciated in the cul-de-sac to the right.  On agnostic laparoscopy, normal upper abdominal findings other than moderate volume ascites.  Normal-appearing peritoneum, omentum, small bowel.  Right adnexa enlarged with a mass measuring approximately 8-10 cm.  No carcinomatosis appreciated.  Given this, decision made to convert to an open procedure for tumor debulking.  Approximately 5 L of yellow-tinged ascites was removed upon intra-abdominal entry.  Normal upper abdominal survey including smooth diaphragm and liver, stomach, and omentum.  The small bowel was run from the cecum to the ligament of Treitz with no abnormalities or carcinomatosis noted.  Right ovary replaced by an approximately 8 cm smooth mass, dilated and enlarged fallopian tube.  The mass itself was adherent to the right pelvic sidewall, sigmoid colon, and densely adherent to the anterior cul-de-sac/bladder  peritoneum.  Frozen section consistent with invasive carcinoma, likely high-grade.  Left tube and ovary were surgically absent.  Sigmoid colon with adhesions to the left pelvic sidewall, left aspect of the bladder and the left vaginal cuff.  Somewhat redundant sigmoid which looped over on itself secondary to these adhesions.  Numerous diverticula noted of the sigmoid colon as well as the transverse colon.  The end of surgery, an R0 resection was accomplished.     04/13/2022 Cancer Staging   Staging form: Ovary, Fallopian Tube, and Primary Peritoneal Carcinoma, AJCC 8th Edition - Pathologic stage from 04/13/2022: Stage II (pT2, pN0, cM0) - Signed by Lonn Hicks, MD on 04/13/2022 Stage prefix: Initial diagnosis   04/26/2022 - 08/10/2022 Chemotherapy   Patient is on Treatment Plan : OVARIAN Carboplatin  (AUC 6) + Paclitaxel  (175) q21d X 6 Cycles     05/17/2022 Tumor Marker   Patient's tumor was tested for the following markers: CA-125. Results of the tumor marker test revealed 12.7.   06/06/2022 Genetic Testing   Germline: Negative [facility] Multi-Cancer +RNA Panel.  Report date is 06/06/2022.   The Multi-Cancer + RNA Panel offered by [facility] includes sequencing and/or deletion/duplication analysis of the following 70 genes:  AIP*, ALK, APC*, ATM*, AXIN2*, BAP1*, BARD1*, BLM*, BMPR1A*, BRCA1*, BRCA2*, BRIP1*, CDC73*, CDH1*, CDK4, CDKN1B*, CDKN2A, CHEK2*, CTNNA1*, DICER1*, EPCAM (del/dup only), EGFR, FH*, FLCN*, GREM1 (promoter dup only), HOXB13, KIT, LZTR1, MAX*, MBD4, MEN1*, MET, MITF, MLH1*, MSH2*, MSH3*, MSH6*, MUTYH*, NF1*, NF2*, NTHL1*, PALB2*, PDGFRA, PMS2*, POLD1*, POLE*, POT1*, PRKAR1A*, PTCH1*, PTEN*, RAD51C*, RAD51D*, RB1*, RET, SDHA* (sequencing only), SDHAF2*, SDHB*, SDHC*, SDHD*, SMAD4*, SMARCA4*, SMARCB1*, SMARCE1*, STK11*, SUFU*, TMEM127*, TP53*, TSC1*, TSC2*, VHL*. RNA analysis is performed for * genes.  Somatic: Positive genomic instability score (GIS) of 62 through [facility].  No  pathogenic variants detected in BRCA1/2.  Report date is June 10, 2022.   [facility] CDx includes sequencing and  large rearrangement analysis of BRCA1/2 and genomic instability status through loss of heterozygosity (LOH), telomeric allelic imbalance (TAI) and large-scale state transitions (LST).     06/08/2022 Tumor Marker   Patient's tumor was tested for the following markers: Ca-125. Results of the tumor marker test revealed 16.5.   06/13/2022 - 06/17/2022 Hospital Admission   The patient was admitted to the hospital and found to have diarrhea, diverticulitis and pancytopenia   06/13/2022 Imaging   Colonic diverticulosis. Inflammatory stranding around the mid sigmoid colon compatible with active diverticulitis.   Interval resection of the previously seen lower pelvic solid and cystic mass and resolution of ascites.   Aortic atherosclerosis.   Severely atrophic left kidney, stable since prior study.   06/30/2022 Tumor Marker   Patient's tumor was tested for the following markers: CA-125. Results of the tumor marker test revealed 15.   07/21/2022 Tumor Marker   Patient's tumor was tested for the following markers: CA-125. Results of the tumor marker test revealed 11.4.   08/12/2022 Tumor Marker   Patient's tumor was tested for the following markers: CA-125. Results of the tumor marker test revealed 9.7.   08/14/2022 Imaging   1. Wall thickening of the sigmoid colon with adjacent inflammatory change, overall improved when compared with prior exam. New focal collection of gas involving the wall of the sigmoid colon which extends inferiorly to the area of the left vaginal cuff with contrast material seen within the vaginal cuff. 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 2. Wall thickening of the left lateral wall of the urinary bladder with air seen within the urinary bladder, findings raise concern for additional area of fistulization to the bladder. CT cystogram could be  performed for better evaluation. 3. New pelviectasis and mild dilation of the left ureter, likely secondary to inflammatory change at the area of the left ureterovesicular junction. Chronic atrophy of the left kidney. 4. Increased wall thickening of a large colonic diverticulum of the proximal transverse colon, likely due to diverticulitis. 5. Aortic Atherosclerosis (ICD10-I70.0).     08/23/2022 Imaging   1. Increased large volume gas within the bladder. This raises concern for colovesical fistula. Consider CT cystogram for further evaluation. 2. Similar to slightly decreased inflammatory changes surrounding the sigmoid colon. The previous collection of gas involving the wall of the sigmoid colon has decreased. There is a small amount of residual gas in the area of the left vaginal cuff. There is again a soft tissue tract extending from the vaginal cuff to the sigmoid colon, though no gas is seen within this tract. 3. Decreased size and wall thickening about the large diverticulum in the proximal transverse colon. 4. Slight decrease in pelviectasis and dilation of the left ureter.   Aortic Atherosclerosis (ICD10-I70.0).     08/27/2022 Imaging   1. Continued sigmoid wall thickening and inflammatory reaction with diverticulosis with increased inflammation and layering reactive pelvic fluid since April 15. Further evaluation to exclude underlying colonic lesion is recommended when clinically feasible. 2. There is now a demonstrable fistulous tract from the sigmoid colon into the left dorsal bladder wall, with air and fluid in the bladder and patchy enteric contrast now settling in the dependent bladder. 3. The known left-sided colovaginal fistula is not well seen today but there is contrast in the vagina consistent with continued tract patency. 4. There is now moderate bilateral hydroureteronephrosis into the pelvis where the ureters are difficult to follow due to the pre-existing inflammatory  process, with ureteral tethering by inflammatory  process most likely causing the obstructive uropathy. No stone disease is seen. 5. Severe chronic atrophy of the left kidney. 6. Aortic and coronary artery atherosclerosis. 7. Anemia. 8. Prominent common bile duct at 12 mm, unchanged. 9. Constipation. 10. Osteopenia, scoliosis, and advanced degenerative change of the lumbar spine with acquired spinal stenosis L4-5.   09/01/2022 Surgery   Preoperative diagnosis: colovesical fistula   Postoperative diagnosis: same    Procedure: laparoscopic converted to open sigmoid resection with anastomosis, diverting loop ileostomy, take down of colovesical fistula   Surgeon: Herlene Bureau, M.D.   Asst: [place] Mais, [facility]   Anesthesia: GETA   Indications for procedure: STEPHAINE BRESHEARS is a 15 y.o. year old female with symptoms of recurrent UTIs and abdominal pain.   Description of procedure: The patient was brought into the operative suite. Anesthesia was administered with General endotracheal anesthesia. WHO checklist was applied. The patient was then placed in lithotomy position. The area was prepped and draped in the usual sterile fashion.   Dr. Renda performed a cysto and plans ureteral stents prior to surgery.   Next, a left subcostal incision was made. A 5mm trocar was used to gain access to the peritoneal cavity by optical entry technique. Pneumoperitoneum was applied with a high flow and low pressure. The laparoscope was reinserted to confirm position.   3 additional trocars were placed 1 5 mm trocar in the right upper abdomen, 1 5 mm trocar in the infraumbilical space, and 1 12 mm trocar in the right lower quadrant.   There were no adhesions to the abdominal wall. The sigmoid colon was thickened and chronically inflamed. There was one loop of small intestine adhesed to the sigmoid colon, this was sharply dissected free. The White line of Toldt was incised. Blunt dissection was used to free  up the colon from the bladder and abdominal wall. This was very difficult. During this dissection Dr. Charlie came in to look at the abdomen and determined no evidence of cancer was present. After about 30 minutes of laparoscopic dissection, I did not think I could safely complete dissection laparoscopically and so a lower midline incision was made.   Next, palpation identified the ureters which were well away from the dissection. Blunt dissection was able to free the colon completely from the bladder and side wall. Next, a blue load contour stapler was used to divide the distal sigmoid colon. The mesentery was taken with ligasure device. The left colon was fully mobilized a portion of colon proximal to the inflammatory changes was purse stringed and divided distally. A 29 mm anvil was placed into the colon and purse string tied down.    Next, Dr. Renda came in to repair the bladder. See his dictation for more information.   The rectum was mobilized more by freeing it from the side walls. At this point I went below to palpate the rectum, there was some difficulty to get the dilator to reach the staple line. There was an attempt to pass the stapler but this led to a tear in the distal rectum. Dr. Sheldon came in at this time to assist and a further margin of sigmoid/rectum was taken with the contour stapler. This allowed the stapler to come to the end and anastomosis was performed. Leak test showed a small leak distal to the staple line that was repaired with interrupted 3-0 vicryl. Due to this leak and thin tissue at the area, decision was made to divert with loop ileostomy.   The  12 mm trocar site was enlarged and a portion of distal ileum brought through the hole. A 19 fr blake drain was brought through RUQ incision and tip placed into the pelvis. The abdomen was irrigated. Hemostasis was intact. The peritoneum was closed with 2-0 vicryl. The fascia was closed with 0 PDS. The ileostomy was matured with 3-0  and 2-3 cm of Brooking. The skin was closed with staples. Ileostomy bag was placed. All counts were correct.   Findings: colovesical fistula, dense inflammation of the sigmoid colon   09/01/2022 Surgery   Preoperative diagnosis: 1.  Colovesical fistula 2.  Atrophic left kidney 3.  Right hydronephrosis   Postoperative diagnosis: 1.  Colovesical fistula 2.  Atrophic left kidney 3.  Right hydronephrosis   Procedures: 1.  Cystoscopy 2.  Bilateral retrograde pyelography with interpretation 3.  Bilateral ureteral stent placement (right -6 x 24, left -6 x 26) 4.  Repair of bladder fistula   10/01/2022 Tumor Marker   Patient's tumor was tested for the following markers: CA-125. Results of the tumor marker test revealed 14.   10/11/2022 -  Chemotherapy   She started taking olaparib    10/25/2022 Tumor Marker   Patient's tumor was tested for the following markers: CA-125. Results of the tumor marker test revealed 10.6.   11/15/2022 Procedure   Successful removal of implanted Port-A-Cath.    02/14/2023 Tumor Marker   Patient's tumor was tested for the following markers: CA-125. Results of the tumor marker test revealed 9.5.   03/14/2023 Imaging   CT ABDOMEN PELVIS WO CONTRAST  Result Date: 03/18/2023 CLINICAL DATA:  Ovarian cancer.  Restaging.  * Tracking Code: BO * EXAM: CT ABDOMEN AND PELVIS WITHOUT CONTRAST TECHNIQUE: Multidetector CT imaging of the abdomen and pelvis was performed following the standard protocol without IV contrast. RADIATION DOSE REDUCTION: This exam was performed according to the departmental dose-optimization program which includes automated exposure control, adjustment of the mA and/or kV according to patient size and/or use of iterative reconstruction technique. COMPARISON:  08/27/2022 FINDINGS: Lower chest: Unremarkable. Hepatobiliary: Stable tiny hypodensity in the central liver. 6 mm hypodensity subcapsular right liver on 28/2 is stable. There is no evidence for  gallstones, gallbladder wall thickening, or pericholecystic fluid. No intrahepatic or extrahepatic biliary dilation. Pancreas: No focal mass lesion. No dilatation of the main duct. No intraparenchymal cyst. No peripancreatic edema. Spleen: No splenomegaly. No suspicious focal mass lesion. Adrenals/Urinary Tract: No adrenal nodule or mass. Left kidney markedly atrophic with stable small cyst. No followup imaging is recommended. No hydronephrosis in the right kidney. Tiny hypodensity upper pole right kidney is too small to characterize but likely benign. No evidence for hydroureter. The urinary bladder appears normal for the degree of distention. Stomach/Bowel: Stomach is unremarkable. No gastric wall thickening. No evidence of outlet obstruction. Duodenum is normally positioned as is the ligament of Treitz. Large duodenal diverticulum again noted. No small bowel wall thickening. No small bowel dilatation. Diverticuli are seen scattered along the entire length of the colon without CT findings of diverticulitis. Interval left hemicolectomy with rectosigmoid anastomosis. Vascular/Lymphatic: There is moderate atherosclerotic calcification of the abdominal aorta without aneurysm. There is no gastrohepatic or hepatoduodenal ligament lymphadenopathy. No retroperitoneal or mesenteric lymphadenopathy. No pelvic sidewall lymphadenopathy. Reproductive: Hysterectomy.  There is no adnexal mass. Other: No intraperitoneal free fluid. Musculoskeletal: No worrisome lytic or sclerotic osseous abnormality. 11 mm subcutaneous nodule anterior right abdominal wall on 46/2 is new in the interval. IMPRESSION: 1. Interval left hemicolectomy with rectosigmoid anastomosis. 2.  11 mm subcutaneous nodule anterior right abdominal wall is new in the interval. This is nonspecific and may be related to recent injection. Close follow-up recommended. 3. No findings of recurrent or metastatic disease on the current exam. 4.  Aortic Atherosclerosis  (ICD10-I70.0). Electronically Signed   By: Camellia Candle M.D.   On: 03/18/2023 10:30   MM 3D DIAGNOSTIC MAMMOGRAM UNILATERAL LEFT BREAST  Result Date: 03/15/2023 CLINICAL DATA:  Interval follow-up of a likely benign focal asymmetry in the LEFT breast. Personal history of malignant RIGHT mastectomy and excisional biopsy of a complex sclerosing lesion from the LEFT breast in May, 2023. EXAM: DIGITAL DIAGNOSTIC UNILATERAL LEFT MAMMOGRAM WITH TOMOSYNTHESIS AND CAD TECHNIQUE: Left digital diagnostic mammography and breast tomosynthesis was performed. The images were evaluated with computer-aided detection. COMPARISON:  Previous exams, most recently May and June, 2023. ACR Breast Density Category b: There are scattered areas of fibroglandular density. FINDINGS: Full field CC and MLO views were obtained. No findings suspicious for malignancy. The focal asymmetry in the outer breast questioned previously represents normal fibroglandular tissue. IMPRESSION: No mammographic evidence of malignancy involving the LEFT breast. RECOMMENDATION: Screening LEFT mammogram in one year.(Code:[id number]) I have discussed the findings and recommendations with the patient. If applicable, a reminder letter will be sent to the patient regarding the next appointment. BI-RADS CATEGORY  1: Negative. Electronically Signed   By: Debby Satterfield M.D.   On: 03/15/2023 11:24      03/16/2023 Tumor Marker   Patient's tumor was tested for the following markers: CA-125. Results of the tumor marker test revealed 7.8.   05/22/2023 Tumor Marker   Patient's tumor was tested for the following markers: CA-125. Results of the tumor marker test revealed 9.0.   07/14/2023 Imaging   CT ABDOMEN PELVIS WO CONTRAST Result Date: 07/21/2023 CLINICAL DATA:  Right ovarian epithelial cancer. Asymptomatic. Re-stage * Tracking Code: BO * EXAM: CT ABDOMEN AND PELVIS WITHOUT CONTRAST TECHNIQUE: Multidetector CT imaging of the abdomen and pelvis was performed  following the standard protocol without IV contrast. RADIATION DOSE REDUCTION: This exam was performed according to the departmental dose-optimization program which includes automated exposure control, adjustment of the mA and/or kV according to patient size and/or use of iterative reconstruction technique. COMPARISON:  03/14/2023 FINDINGS: Lower chest: Clear lung bases. Normal heart size without pericardial or pleural effusion. Right mastectomy. Hepatobiliary: Scattered hepatic cysts of less than a cm are unchanged. Normal gallbladder, without biliary ductal dilatation. Pancreas: Normal, without mass or ductal dilatation. Spleen: Normal in size, without focal abnormality. Adrenals/Urinary Tract: Normal adrenal glands. Subcentimeter upper pole right renal low-density lesion is likely a cyst. Marked left renal atrophy. Punctate interpolar left renal collecting system calculus versus vascular calcification. Low-density left renal lesions of up to 1.0 cm are likely cysts . In the absence of clinically indicated signs/symptoms require(s) no independent follow-up. No hydronephrosis. No bladder calculi. Stomach/Bowel: Apparent gastric antral wall thickening on 17/2 could be due to underdistention. This is unchanged. Surgical sutures within the rectosigmoid junction. Colonic stool burden suggests constipation. Normal terminal ileum. Descending duodenal diverticulum. Vascular/Lymphatic: Aortic atherosclerosis. No abdominopelvic adenopathy. Reproductive: Hysterectomy.  No adnexal mass. Other: No significant free fluid. Moderate pelvic floor laxity. No evidence of omental or peritoneal disease. Musculoskeletal: The previously described right anterior pelvic wall nodule has resolved. Osteopenia. Lumbosacral spondylosis. Minimal convex right lumbar spine curvature. IMPRESSION: 1. No acute process or evidence of metastatic disease in the abdomen or pelvis. The anterior right pelvic wall nodule on the prior exam is resolved.  2.  Marked left renal atrophy.  Possible left nephrolithiasis. 3. Apparent gastric antral wall thickening could be due to underdistention or gastritis. 4.  Aortic Atherosclerosis (ICD10-I70.0). Electronically Signed   By: Rockey Kilts M.D.   On: 07/21/2023 08:41      07/15/2023 Tumor Marker   Patient's tumor was tested for the following markers: CA-125. Results of the tumor marker test revealed 9.6.   09/16/2023 Tumor Marker   Patient's tumor was tested for the following markers: CA-125. Results of the tumor marker test revealed 10.9.   11/16/2023 Tumor Marker   Patient's tumor was tested for the following markers: CA=-125. Results of the tumor marker test revealed 10.8.   02/16/2024 Imaging   CT ABDOMEN PELVIS WO CONTRAST Result Date: 02/19/2024 CLINICAL DATA:  Ovarian cancer, monitor, high-risk recurrence. * Tracking Code: BO * EXAM: CT ABDOMEN AND PELVIS WITHOUT CONTRAST TECHNIQUE: Multidetector CT imaging of the abdomen and pelvis was performed following the standard protocol without IV contrast. RADIATION DOSE REDUCTION: This exam was performed according to the departmental dose-optimization program which includes automated exposure control, adjustment of the mA and/or kV according to patient size and/or use of iterative reconstruction technique. COMPARISON:  Multiple priors including CT July 14, 2023 FINDINGS: Lower chest: Prior right mastectomy. Bibasilar atelectasis/scarring. Hepatobiliary: Stable sub/pericentimeter hepatic hypodense lesions previously characterized as cysts. No new suspicious hepatic lesion on noncontrast enhanced examination. Gallbladder is unremarkable. No biliary ductal dilation. Pancreas: No pancreatic ductal dilation or evidence of acute inflammation. Spleen: No splenomegaly. Adrenals/Urinary Tract: No suspicious adrenal nodule/mass. No hydronephrosis. Similar marked left renal atrophy. Low-density left renal lesions compatible with cysts are stable from prior. Stable  subcentimeter hypodense right upper pole renal lesion on image 49/5 statistically likely a cyst. Urinary bladder is unremarkable for degree of distension. Stomach/Bowel: No radiopaque enteric contrast material was administered. Stomach is unremarkable for degree of distension. No evidence of acute bowel inflammation. Prior partial rectal/sigmoid resection with reanastomosis. Enterotomy sutures in the right hemiabdomen. Vascular/Lymphatic: Aortic and branch vessel atherosclerosis. No pathologically enlarged abdominal or pelvic lymph nodes. Reproductive: Uterus is surgically absent. No new suspicious nodularity in the adnexa. Other: No significant abdominopelvic free fluid. Postsurgical change in the abdominal wall. Musculoskeletal: No aggressive lytic or blastic lesion of bone. Diffuse demineralization of bone. Thoracolumbar spondylosis. Degenerative change of the bilateral hips and SI joints with chronic osseous changes of the pubic symphysis. IMPRESSION: 1. Stable examination status post hysterectomy and bilateral oophorectomy without evidence of local recurrence or abdominopelvic metastatic disease on this noncontrast enhanced examination. 2.  Aortic Atherosclerosis (ICD10-I70.0). Electronically Signed   By: Reyes Holder M.D.   On: 02/19/2024 06:36      02/17/2024 Tumor Marker   Patient's tumor was tested for the following markers: CA-125. Results of the tumor marker test revealed 11.5.     Interval History: Doing well.  Notes that she has basically back to her normal weight.  Bowel function has improved, still has to be careful with how much fiber she eats.  Has intermittent dizzy spells that last for a few seconds, has to be careful not to turn too quickly.  Had 1 fall recently that was unrelated to dizziness where she got a little off balance and had slippery socks on.  Has occasional abdominal twinges, denies any significant pain.  Her daughter fell in the yard last week, needs to have surgery.   Patient is planning to drive up to be with her for the surgery.  Past Medical/Surgical History: Past Medical  History:  Diagnosis Date   Acute diverticulitis 06/13/2022   Arthritis    Breast cancer (HCC) 1980   RIGHT   Chronic kidney disease    has one kidney   Diverticulitis of colon    DVT (deep venous thrombosis) (HCC)    on RIGHT leg-hx of, superficial   Dyspnea    Headache    occ migraine   Hyperlipidemia    on meds   Hypertension    on meds   Irritant contact dermatitis associated with fecal stoma 09/28/2022   Neutropenic fever 06/15/2022   Osteopenia    Ovarian cancer (HCC) 03/2022   Peripheral vascular disease    PONV (postoperative nausea and vomiting)    Seasonal allergies    Thyroid  disease    on meds    Past Surgical History:  Procedure Laterality Date   ABDOMINAL HYSTERECTOMY     APPENDECTOMY     BLADDER REPAIR  09/01/2022   Procedure: REPAIR BLADDER FISTULA WITH HYDRODISTENTION WITH METHYLINE BLUE;  Surgeon: Renda Glance, MD;  Location: [facility];  Service: Urology;;   BREAST BIOPSY     BREAST LUMPECTOMY WITH RADIOACTIVE SEED LOCALIZATION Left 12/01/2021   Procedure: LEFT BREAST RADIOACTIVE SEED GUIDED LUMPECTOMY;  Surgeon: Vernetta Berg, MD;  Location: [facility];  Service: General;  Laterality: Left;  LMA   COLON RESECTION N/A 09/01/2022   Procedure: LAPAROSCOPIC SIGMOIDECTOMY CONVERTED TO OPEN, TAKEDOWN OF COLOVESICAL FISTULA AND LOOP COLOSTOMY;  Surgeon: Stevie Herlene Righter, MD;  Location: [facility];  Service: General;  Laterality: N/A;   COLONOSCOPY  2011   Dr.[email or web address]-normal   CYSTOSCOPY WITH STENT PLACEMENT  09/01/2022   Procedure: CYSTOSCOPY WITH BILATERAL URETERAL STENT PLACEMENTS;  Surgeon: Renda Glance, MD;  Location: [facility];  Service: Urology;;   DEBULKING N/A 03/30/2022   Procedure: TUMOR DEBULKING;  Surgeon: Viktoria Comer SAUNDERS, MD;  Location: [facility];  Service: Gynecology;  Laterality: N/A;   EYE SURGERY     78 years old    ILEOSTOMY CLOSURE N/A 01/11/2023   Procedure: LOOP ILEOSTOMY REVERSAL;  Surgeon: Stevie Herlene Righter, MD;  Location: [facility];  Service: General;  Laterality: N/A;   IR IMAGING GUIDED PORT INSERTION  03/19/2022   IR REMOVAL TUN ACCESS W/ PORT W/O FL MOD SED  11/15/2022   LAPAROSCOPY N/A 03/30/2022   Procedure: LAPAROSCOPY DIAGNOSTIC;  Surgeon: Viktoria Comer SAUNDERS, MD;  Location: [facility];  Service: Gynecology;  Laterality: N/A;   MASTECTOMY Right 1984   ROTATOR CUFF REPAIR     SALPINGOOPHORECTOMY Bilateral 03/30/2022   Procedure: OPEN SALPINGO OOPHORECTOMY;  Surgeon: Viktoria Comer SAUNDERS, MD;  Location: [facility];  Service: Gynecology;  Laterality: Bilateral;   THYROID  SURGERY Bilateral 1967   VARICOSE VEIN SURGERY     ablation   WISDOM TOOTH EXTRACTION      Family History  Problem Relation Age of Onset   Heart attack Mother    Alcohol abuse Mother    Lung cancer Mother        d. 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  Tobacco Use   Smoking status: Former    Current packs/day: 0.00    Types: Cigarettes    Quit date: 05/11/1967    Years since quitting: 56.8    Passive exposure: Past   Smokeless tobacco: Never  Vaping Use   Vaping status: Never Used  Substance and Sexual Activity   Alcohol use: No   Drug use: No   Sexual activity: Not Currently    Birth control/protection: Post-menopausal, Surgical    Comment: hyst  Other Topics Concern   Not on file  Social History Narrative   Lives alone - very close with her neighbors   Social Drivers of  Corporate investment banker Strain: Low Risk  (01/26/2024)   Overall Financial Resource Strain (CARDIA)    Difficulty of Paying Living Expenses: Not hard at all  Food Insecurity: No Food Insecurity (01/26/2024)   Hunger Vital Sign    Worried About Running Out of Food in the Last Year: Never true    Ran Out of Food in the Last Year: Never true  Transportation Needs: No Transportation Needs (01/26/2024)   PRAPARE - Administrator, Civil Service (Medical): No    Lack of Transportation (Non-Medical): No  Physical Activity: Sufficiently Active (01/26/2024)   Exercise Vital Sign    Days of Exercise per Week: 7 days    Minutes of Exercise per Session: 30 min  Stress: No Stress Concern Present (01/26/2024)   [employer] of Occupational Health - Occupational Stress Questionnaire    Feeling of Stress: Not at all  Social Connections: Unknown (01/26/2024)   Social Connection and Isolation Panel    Frequency of Communication with Friends and Family: More than three times a week    Frequency of Social Gatherings with Friends and Family: More than three times a week    Attends Religious Services: More than 4 times per year    Active Member of [employer] or Organizations: Yes    Attends Engineer, structural: More than 4 times per year    Marital Status: Patient declined    Current Medications:  Current Outpatient Medications:    acetaminophen  (TYLENOL ) 500 MG tablet, Take 2 tablets (1,000 mg total) by mouth every 6 (six) hours as needed for mild pain., Disp: 30 tablet, Rfl: 0   amLODipine  (NORVASC ) 5 MG tablet, Take 1 tablet (5 mg total) by mouth daily., Disp: 90 tablet, Rfl: 3   aspirin  EC 81 MG tablet, Take 81 mg by mouth at bedtime. Swallow whole., Disp: , Rfl:    atenolol  (TENORMIN ) 25 MG tablet, Take 0.5 tablets (12.5 mg total) by mouth 2 (two) times daily., Disp: 90 tablet, Rfl: 3   Calcium  Carb-Cholecalciferol  (CALCIUM  + VITAMIN D3 PO), Take 1 tablet by mouth 2 (two) times  daily., Disp: , Rfl:    levothyroxine  (SYNTHROID ) 100 MCG tablet, Take 1 tablet (100 mcg total) by mouth in the morning., Disp: 90 tablet, Rfl: 3   loratadine  (CLARITIN ) 10 MG tablet, Take 10 mg by mouth daily., Disp: , Rfl:    Multiple Vitamin (MULTIVITAMIN) tablet, Take 1 tablet by mouth in the morning., Disp: , Rfl:    olaparib  (LYNPARZA ) 100 MG tablet, Take 1 tablet (100 mg total) by mouth 2 (two) times daily. Swallow whole. May take with food to decrease nausea and vomiting., Disp: 60 tablet, Rfl: 11  simvastatin  (ZOCOR ) 20 MG tablet, Take 1 tablet (20 mg total) by mouth daily., Disp: 90 tablet, Rfl: 3   triamcinolone  (NASACORT ) 55 MCG/ACT AERO nasal inhaler, Place 2 sprays into the nose daily., Disp: , Rfl:   Review of Systems: Denies appetite changes, fevers, chills, fatigue, unexplained weight changes. Denies hearing loss, neck lumps or masses, mouth sores, ringing in ears or voice changes. Denies cough or wheezing.  Denies shortness of breath. Denies chest pain or palpitations. Denies leg swelling. Denies abdominal distention, pain, blood in stools, constipation, diarrhea, nausea, vomiting, or early satiety. Denies pain with intercourse, dysuria, frequency, hematuria or incontinence. Denies hot flashes, pelvic pain, vaginal bleeding or vaginal discharge.   Denies joint pain, back pain or muscle pain/cramps. Denies itching, rash, or wounds. Denies dizziness, headaches, numbness or seizures. Denies swollen lymph nodes or glands, denies easy bruising or bleeding. Denies anxiety, depression, confusion, or decreased concentration.  Physical Exam: BP 128/72 (BP Location: Left Arm, Patient Position: Sitting)   Pulse 68   Temp 97.8 F (36.6 C) (Oral)   Resp 19   Wt 156 lb (70.8 kg)   SpO2 100%   BMI 25.96 kg/m  General: Alert, oriented, no acute distress. HEENT: Posterior oropharynx clear, sclera anicteric. Chest: Clear to auscultation bilaterally.  No wheezes or  rhonchi. Cardiovascular: Regular rate and rhythm, no murmurs. Abdomen: soft, nontender.  Normoactive bowel sounds.  No masses or hepatosplenomegaly appreciated.  Well-healed incisions.  Extremities: Grossly normal range of motion.  Warm, well perfused.  No edema bilaterally. Skin: No rashes or lesions noted. Lymphatics: No cervical, supraclavicular, or inguinal adenopathy. GU: Normal appearing external genitalia without erythema, excoriation, or lesions.  Speculum exam reveals moderately atrophic vaginal mucosa, cuff intact, no masses appreciated.  Bimanual exam reveals no masses or nodularity.    Laboratory & Radiologic Studies:          Component Ref Range & Units (hover) 7 d ago 3 mo ago 5 mo ago 7 mo ago 9 mo ago 11 mo ago 1 yr ago  Cancer Antigen (CA) 125 11.5 10.8 CM 10.9 CM 9.6 CM 9.0 CM 7.8 CM 9.5    Assessment & Plan: April Weber is a 78 y.o. 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  HRD positive.  Olaparib  started in mid 2024.  Recent history of significant for complications related to diverticular disease with development of a Colovesicular fistula.  She underwent sigmoid colectomy, diverting loop ileostomy and bladder repair in April 2024.  Ultimately had ileostomy reversal in September 2024.  Most recent imaging in October shows no recurrent disease.   Patient is overall doing well, NED on exam today.   She is overall tolerating PARPi without significant side effects.   She sees Dr. Lonn in November. I will see her for follow-up in 4 months.  20 minutes of total time was spent for this patient encounter, including preparation, face-to-face counseling with the patient and coordination of care, and documentation of the encounter.  Comer Dollar, MD  Division of Gynecologic Oncology  Department of Obstetrics and Gynecology  [facility]

## 2024-02-24 DIAGNOSIS — Z23 Encounter for immunization: Secondary | ICD-10-CM | POA: Diagnosis not present

## 2024-03-14 ENCOUNTER — Other Ambulatory Visit (HOSPITAL_COMMUNITY): Payer: Self-pay

## 2024-03-14 ENCOUNTER — Ambulatory Visit
Admission: RE | Admit: 2024-03-14 | Discharge: 2024-03-14 | Disposition: A | Discharge status: Home / Self Care | Source: Ambulatory Visit | Attending: Family Medicine | Admitting: Family Medicine

## 2024-03-14 DIAGNOSIS — Z1231 Encounter for screening mammogram for malignant neoplasm of breast: Secondary | ICD-10-CM | POA: Diagnosis not present

## 2024-03-16 ENCOUNTER — Other Ambulatory Visit: Payer: Self-pay

## 2024-03-16 NOTE — Progress Notes (Signed)
 Specialty Pharmacy Refill Coordination Note  April Weber is a 78 y.o. female contacted today regarding refills of specialty medication(s) Olaparib  (LYNPARZA )   Patient requested Delivery   Delivery date: 03/21/24   Verified address: [address]  [place] [address]   Medication will be filled on: 03/20/24

## 2024-03-19 ENCOUNTER — Telehealth: Payer: Self-pay | Admitting: Gastroenterology

## 2024-03-19 ENCOUNTER — Other Ambulatory Visit: Payer: Self-pay | Admitting: Hematology and Oncology

## 2024-03-19 DIAGNOSIS — C561 Malignant neoplasm of right ovary: Secondary | ICD-10-CM

## 2024-03-19 DIAGNOSIS — T189XXA Foreign body of alimentary tract, part unspecified, initial encounter: Secondary | ICD-10-CM

## 2024-03-19 NOTE — Telephone Encounter (Signed)
 Should not be a problem. It usually passes without any problems. Still, to be on safer side, get X Ray KUB 2 View RG

## 2024-03-19 NOTE — Telephone Encounter (Signed)
 PT was eating a pizza with a plastic fork and noticed that a piece of it was missing. She is not sure if she ate it or not, but she wants to know what she should do. Please advise

## 2024-03-19 NOTE — Telephone Encounter (Signed)
 Spoke with patient.  She is not sure if she  swallowed approximately 1/2 plastic fork tine. She has no symptoms or can confirm for sure if she swallowed it. I advised her body should safely expel it without intervention. Please advise.

## 2024-03-20 ENCOUNTER — Inpatient Hospital Stay (HOSPITAL_BASED_OUTPATIENT_CLINIC_OR_DEPARTMENT_OTHER): Admitting: Hematology and Oncology

## 2024-03-20 ENCOUNTER — Other Ambulatory Visit: Payer: Self-pay

## 2024-03-20 ENCOUNTER — Encounter: Payer: Self-pay | Admitting: Hematology and Oncology

## 2024-03-20 ENCOUNTER — Inpatient Hospital Stay: Attending: Gynecologic Oncology

## 2024-03-20 VITALS — BP 132/72 | HR 67 | Temp 98.9°F | Resp 17 | Ht 65.0 in | Wt 157.6 lb

## 2024-03-20 DIAGNOSIS — C561 Malignant neoplasm of right ovary: Secondary | ICD-10-CM

## 2024-03-20 DIAGNOSIS — Z9221 Personal history of antineoplastic chemotherapy: Secondary | ICD-10-CM | POA: Diagnosis not present

## 2024-03-20 DIAGNOSIS — Z79899 Other long term (current) drug therapy: Secondary | ICD-10-CM | POA: Diagnosis not present

## 2024-03-20 LAB — CBC WITH DIFFERENTIAL/PLATELET
Abs Immature Granulocytes: 0.03 K/uL (ref 0.00–0.07)
Basophils Absolute: 0.1 K/uL (ref 0.0–0.1)
Basophils Relative: 1 %
Eosinophils Absolute: 0.2 K/uL (ref 0.0–0.5)
Eosinophils Relative: 3 %
HCT: 35.1 % — ABNORMAL LOW (ref 36.0–46.0)
Hemoglobin: 12.4 g/dL (ref 12.0–15.0)
Immature Granulocytes: 1 %
Lymphocytes Relative: 12 %
Lymphs Abs: 0.8 K/uL (ref 0.7–4.0)
MCH: 34.3 pg — ABNORMAL HIGH (ref 26.0–34.0)
MCHC: 35.3 g/dL (ref 30.0–36.0)
MCV: 97.2 fL (ref 80.0–100.0)
Monocytes Absolute: 0.6 K/uL (ref 0.1–1.0)
Monocytes Relative: 8 %
Neutro Abs: 5 K/uL (ref 1.7–7.7)
Neutrophils Relative %: 75 %
Platelets: 236 K/uL (ref 150–400)
RBC: 3.61 MIL/uL — ABNORMAL LOW (ref 3.87–5.11)
RDW: 13.2 % (ref 11.5–15.5)
WBC: 6.6 K/uL (ref 4.0–10.5)
nRBC: 0 % (ref 0.0–0.2)

## 2024-03-20 LAB — COMPREHENSIVE METABOLIC PANEL WITH GFR
ALT: 13 U/L (ref 0–44)
AST: 20 U/L (ref 15–41)
Albumin: 4.1 g/dL (ref 3.5–5.0)
Alkaline Phosphatase: 59 U/L (ref 38–126)
Anion gap: 7 (ref 5–15)
BUN: 17 mg/dL (ref 8–23)
CO2: 27 mmol/L (ref 22–32)
Calcium: 9.3 mg/dL (ref 8.9–10.3)
Chloride: 102 mmol/L (ref 98–111)
Creatinine, Ser: 1.47 mg/dL — ABNORMAL HIGH (ref 0.44–1.00)
GFR, Estimated: 36 mL/min — ABNORMAL LOW (ref >60)
Glucose, Bld: 83 mg/dL (ref 70–99)
Potassium: 3.9 mmol/L (ref 3.5–5.1)
Sodium: 136 mmol/L (ref 135–145)
Total Bilirubin: 0.6 mg/dL (ref 0.0–1.2)
Total Protein: 6.7 g/dL (ref 6.5–8.1)

## 2024-03-20 NOTE — Progress Notes (Signed)
 [facility] OFFICE PROGRESS NOTE  Patient Care Team: Jolinda Norene HERO, DO as PCP - General (Family Medicine) Rox Charleston, MD as Attending Physician (Obstetrics and Gynecology) Patty Anes, MD as Consulting Physician (Family Medicine) Vicci Mcardle, OD (Optometry) Care, [facility]) Lonn Hicks, MD as Consulting Physician (Hematology and Oncology) [name], Herlene Righter, MD as Consulting Physician (General Surgery) Ivonne Harlene RAMAN, RD as Dietitian (Dietician)  Assessment & Plan Right ovarian epithelial cancer [facility]) She was diagnosed with ovarian cancer in 2023, status post surgery and completion adjuvant treatment in April 2024 Final pathology: High grade papillary serous, 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  She had severe diverticulitis complicated by fistula requiring diverting loop ileostomy and aggressive antibiotics treatment, subsequently reversed She has been placed on olaparib  since June 2024  She tolerated elaborate well Labs are satisfactory The plan will be to continue Olaparib  at current dose Recent imaging study from October 2025 showed no evidence of disease I will see her again in 2 months She will complete treatment with olaparib  June 2026  No orders of the defined types were placed in this encounter.    Hicks Lonn, MD  INTERVAL HISTORY: she returns for treatment follow-up on maintenance olaparib  Complications related to previous cycle of chemotherapy included none  PHYSICAL EXAMINATION: ECOG PERFORMANCE STATUS: 0 - Asymptomatic  Lab Results  Component Value Date   CAN125 11.5 02/16/2024   CAN125 10.8 11/15/2023   CAN125 10.9 09/15/2023      Latest Ref Rng & Units 03/20/2024    9:52 AM 02/16/2024    8:12 AM 11/15/2023   10:05 AM  CBC  WBC 4.0 - 10.5 K/uL 6.6  9.7  6.9   Hemoglobin 12.0 - 15.0 g/dL 87.5  86.4  86.9   Hematocrit 36.0 - 46.0 % 35.1  38.1  36.9   Platelets 150 - 400  K/uL 236  251  230       Chemistry      Component Value Date/Time   NA 136 03/20/2024 0952   NA 139 10/10/2023 0855   K 3.9 03/20/2024 0952   CL 102 03/20/2024 0952   CO2 27 03/20/2024 0952   BUN 17 03/20/2024 0952   BUN 20 10/10/2023 0855   CREATININE 1.47 (H) 03/20/2024 0952   CREATININE 0.93 08/10/2022 0857   CREATININE 0.99 02/05/2013 0757      Component Value Date/Time   CALCIUM  9.3 03/20/2024 0952   ALKPHOS 59 03/20/2024 0952   AST 20 03/20/2024 0952   AST 12 (L) 08/10/2022 0857   ALT 13 03/20/2024 0952   ALT 11 08/10/2022 0857   BILITOT 0.6 03/20/2024 0952   BILITOT 0.5 10/10/2023 0855   BILITOT 0.3 08/10/2022 0857       Vitals:   03/20/24 1025  BP: 132/72  Pulse: 67  Resp: 17  Temp: 98.9 F (37.2 C)  SpO2: 98%   Filed Weights   03/20/24 1025  Weight: 157 lb 9.6 oz (71.5 kg)   Other relevant data reviewed during this visit included CBC and CMP

## 2024-03-20 NOTE — Assessment & Plan Note (Addendum)
 She was diagnosed with ovarian cancer in 2023, status post surgery and completion adjuvant treatment in April 2024 Final pathology: High grade papillary serous, 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  She had severe diverticulitis complicated by fistula requiring diverting loop ileostomy and aggressive antibiotics treatment, subsequently reversed She has been placed on olaparib  since June 2024  She tolerated elaborate well Labs are satisfactory The plan will be to continue Olaparib  at current dose Recent imaging study from October 2025 showed no evidence of disease I will see her again in 2 months She will complete treatment with olaparib  June 2026

## 2024-03-21 LAB — CA 125: Cancer Antigen (CA) 125: 10 U/mL (ref 0.0–38.1)

## 2024-03-21 NOTE — Telephone Encounter (Signed)
 Spoke with patient.  Provided advice and recommendation. Patient states she feels fine, has had multiple BMs and is not having any abnormal symptoms.  Order placed . Patient will opt for x-ray if she has any changes or abnormal symptoms.

## 2024-04-04 ENCOUNTER — Other Ambulatory Visit (HOSPITAL_COMMUNITY): Payer: Self-pay

## 2024-04-04 ENCOUNTER — Telehealth: Payer: Self-pay

## 2024-04-04 NOTE — Telephone Encounter (Signed)
 Oral Oncology Patient Advocate Encounter  Was successful in securing patient a $3500 grant from [employer] to provide copayment coverage for [employer].  This will keep the out of pocket expense at $0.     [employer] ID: [id number]   The billing information is as follows and has been shared with [facility].    RxBin: [id number] PCN: [id number] Member ID: [id number] Group ID: [id number] Dates of Eligibility: 04/11/24 through 04/10/25  Fund:  Ovarian  Lucie Lamer, CPhT [place]  [facility] Oncology Pharmacy Patient Advocate Specialist II [place] Phone: [phone]  Fax: [phone] [email or web address]

## 2024-04-12 ENCOUNTER — Other Ambulatory Visit (HOSPITAL_COMMUNITY): Payer: Self-pay

## 2024-04-16 ENCOUNTER — Other Ambulatory Visit: Payer: Self-pay

## 2024-04-16 ENCOUNTER — Other Ambulatory Visit (HOSPITAL_COMMUNITY): Payer: Self-pay

## 2024-04-16 NOTE — Progress Notes (Signed)
 Specialty Pharmacy Refill Coordination Note  April Weber is a 78 y.o. female contacted today regarding refills of specialty medication(s) Olaparib  (LYNPARZA )   Patient requested Delivery   Delivery date: 04/19/24   Verified address: [address]  [place] [address]   Medication will be filled on: 04/18/24

## 2024-04-17 ENCOUNTER — Other Ambulatory Visit: Payer: Self-pay

## 2024-04-24 ENCOUNTER — Other Ambulatory Visit: Payer: Self-pay

## 2024-04-24 NOTE — Progress Notes (Signed)
 Specialty Pharmacy Ongoing Clinical Assessment Note  April Weber is a 78 y.o. female who is being followed by the specialty pharmacy service for RxSp Oncology   Patient's specialty medication(s) reviewed today: Olaparib  (LYNPARZA )   Missed doses in the last 4 weeks: 0   Patient/Caregiver did not have any additional questions or concerns.   Therapeutic benefit summary: Patient is achieving benefit (per Dr. Buzz note from 03/20/24, recent imaging study from October 2025 showed no evidence of disease)   Adverse events/side effects summary: Experienced adverse events/side effects (nausea, dizziness, occasional diarrhea, dry/chapped lips; all tolerable at this time)   Patient's therapy is appropriate to: Continue    Goals Addressed             This Visit's Progress    Maintain optimal adherence to therapy   On track    Patient is on track. Patient will maintain adherence         Follow up: 3 months  April Weber Specialty Pharmacist

## 2024-04-25 ENCOUNTER — Telehealth: Payer: Self-pay

## 2024-04-25 ENCOUNTER — Encounter: Payer: Self-pay | Admitting: Family Medicine

## 2024-04-25 ENCOUNTER — Ambulatory Visit: Payer: Self-pay | Admitting: Family Medicine

## 2024-04-25 VITALS — BP 137/76 | HR 67 | Temp 97.2°F | Ht 65.0 in | Wt 155.5 lb

## 2024-04-25 DIAGNOSIS — E890 Postprocedural hypothyroidism: Secondary | ICD-10-CM

## 2024-04-25 DIAGNOSIS — J3089 Other allergic rhinitis: Secondary | ICD-10-CM

## 2024-04-25 DIAGNOSIS — I10 Essential (primary) hypertension: Secondary | ICD-10-CM

## 2024-04-25 DIAGNOSIS — N1832 Chronic kidney disease, stage 3b: Secondary | ICD-10-CM | POA: Diagnosis not present

## 2024-04-25 MED ORDER — FEXOFENADINE HCL 60 MG PO TABS: 60.0000 mg | ORAL_TABLET | Freq: Every day | ORAL | 3 refills | Status: DC

## 2024-04-25 MED ORDER — IPRATROPIUM BROMIDE 0.06 % NA SOLN: 2.0000 | Freq: Three times a day (TID) | NASAL | 12 refills | Status: DC | PRN

## 2024-04-25 MED ORDER — AMLODIPINE BESYLATE 5 MG PO TABS: 5.0000 mg | ORAL_TABLET | Freq: Every day | ORAL | 3 refills | Status: AC

## 2024-04-25 MED ORDER — SIMVASTATIN 20 MG PO TABS: 20.0000 mg | ORAL_TABLET | Freq: Every day | ORAL | 3 refills | Status: AC

## 2024-04-25 MED ORDER — ATENOLOL 25 MG PO TABS: 12.5000 mg | ORAL_TABLET | Freq: Two times a day (BID) | ORAL | 3 refills | Status: AC

## 2024-04-25 MED ORDER — LEVOTHYROXINE SODIUM 100 MCG PO TABS: 100.0000 ug | ORAL_TABLET | Freq: Every morning | ORAL | 3 refills | Status: AC

## 2024-04-25 NOTE — Telephone Encounter (Signed)
 Copied from CRM #[id number]. Topic: Clinical - Medical Advice >> Apr 25, 2024 10:01 AM Miquel SAILOR wrote: Reason for CRM: PT came in 12/17 and discussed on allegra  medication but due to she has Kidney issue. Is it safe for her to take this. Needs call back [phone]

## 2024-04-25 NOTE — Telephone Encounter (Signed)
 Open in error

## 2024-04-25 NOTE — Progress Notes (Signed)
 Subjective: CC: 81-month checkup PCP: Jolinda Norene HERO, DO YEP:April Weber is a 78 y.o. female presenting to clinic today for:  Hypothyroidism Patient is compliant with Synthroid  100 mcg daily.  Reports no tremor, heart palpitations, changes in bowel habits.  She actually has some alternating loose stools after history of colon surgery but nothing that is outside of baseline.  She reports compliance with her blood pressure and cholesterol medications.  No reports of chest pain, shortness of breath.  Continues to follow-up with her specialists as directed.  Continues to try and hydrate well and avoid anything that would be nephrotoxic.  She reports that she recently was able to spend some time with her daughter, who underwent knee surgery recently  She does report that the triamcinolone  nasal spray does not seem to be very efficacious in helping with nasal congestion.  Also thinking maybe she needs to switch off of Claritin  onto an alternative antihistamine but wanted some recommendations on what would be nondrowsy.   ROS: Per HPI  Allergies[1] Past Medical History:  Diagnosis Date   Acute diverticulitis 06/13/2022   Arthritis    Breast cancer (HCC) 1980   RIGHT   Chronic kidney disease    has one kidney   Diverticulitis of colon    DVT (deep venous thrombosis) (HCC)    on RIGHT leg-hx of, superficial   Dyspnea    Headache    occ migraine   Hyperlipidemia    on meds   Hypertension    on meds   Irritant contact dermatitis associated with fecal stoma 09/28/2022   Neutropenic fever 06/15/2022   Osteopenia    Ovarian cancer (HCC) 03/2022   Peripheral vascular disease    PONV (postoperative nausea and vomiting)    Seasonal allergies    Thyroid  disease    on meds   Current Medications[2] Social History   Socioeconomic History   Marital status: Single    Spouse name: Divorced    Number of children: 1   Years of education: Not on file   Highest education level:  Not on file  Occupational History   Occupation: 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  Tobacco Use   Smoking status: Former    Current packs/day: 0.00    Average packs/day: 0.3 packs/day    Types: Cigarettes    Quit date: 05/11/1967    Years since quitting: 56.9    Passive exposure: Past   Smokeless tobacco: Never  Vaping Use   Vaping status: Never Used  Substance and Sexual Activity   Alcohol use: No   Drug use: No   Sexual activity: Not Currently    Birth control/protection: Post-menopausal, Surgical    Comment: hyst  Other Topics Concern   Not on file  Social History Narrative   Lives alone - very close with her neighbors   Social Drivers of Health   Tobacco Use: Medium Risk (04/25/2024)   Patient History    Smoking Tobacco Use: Former    Smokeless Tobacco Use: Never    Passive Exposure: Past  Physicist, Medical Strain: Low Risk (01/26/2024)   Overall Financial Resource Strain (CARDIA)    Difficulty of Paying Living Expenses: Not hard at all  Food Insecurity: No Food Insecurity (01/26/2024)   Epic    Worried About Programme Researcher, Broadcasting/film/video in the Last Year: Never true    Ran Out of Food in the Last Year: Never true  Transportation Needs: No  Transportation Needs (01/26/2024)   Epic    Lack of Transportation (Medical): No    Lack of Transportation (Non-Medical): No  Physical Activity: Sufficiently Active (01/26/2024)   Exercise Vital Sign    Days of Exercise per Week: 7 days    Minutes of Exercise per Session: 30 min  Stress: No Stress Concern Present (01/26/2024)   [employer] of Occupational Health - Occupational Stress Questionnaire    Feeling of Stress: Not at all  Social Connections: Unknown (01/26/2024)   Social Connection and Isolation Panel    Frequency of Communication with Friends and Family: More than three times a week    Frequency of Social Gatherings with Friends and Family: More than three times a week     Attends Religious Services: More than 4 times per year    Active Member of Clubs or Organizations: Yes    Attends Banker Meetings: More than 4 times per year    Marital Status: Patient declined  Intimate Partner Violence: Not At Risk (01/26/2024)   Epic    Fear of Current or Ex-Partner: No    Emotionally Abused: No    Physically Abused: No    Sexually Abused: No  Depression (PHQ2-9): Low Risk (01/26/2024)   Depression (PHQ2-9)    PHQ-2 Score: 0  Alcohol Screen: Low Risk (01/26/2024)   Alcohol Screen    Last Alcohol Screening Score (AUDIT): 0  Housing: Unknown (01/26/2024)   Epic    Unable to Pay for Housing in the Last Year: No    Number of Times Moved in the Last Year: Not on file    Homeless in the Last Year: No  Utilities: Not At Risk (01/26/2024)   Epic    Threatened with loss of utilities: No  Health Literacy: Adequate Health Literacy (01/26/2024)   B1300 Health Literacy    Frequency of need for help with medical instructions: Never   Family History  Problem Relation Age of Onset   Heart attack Mother    Alcohol abuse Mother    Lung cancer Mother        d. mid 52s   Breast cancer Maternal Aunt 51   Colon polyps Neg Hx    Colon cancer Neg Hx    Esophageal cancer Neg Hx    Stomach cancer Neg Hx    Rectal cancer Neg Hx     Objective: Office vital signs reviewed. BP 137/76   Pulse 67   Temp (!) 97.2 F (36.2 C)   Ht 5' 5 (1.651 m)   Wt 155 lb 8 oz (70.5 kg)   SpO2 99%   BMI 25.88 kg/m   Physical Examination:  General: Awake, alert, well nourished, No acute distress HEENT: Sclera white.  Moist mucous membranes.  No gross rhinorrhea Cardio: regular rate and rhythm, S1S2 heard, no murmurs appreciated Pulm: clear to auscultation bilaterally, no wheezes, rhonchi or rales; normal work of breathing on room air GI: soft, non-tender, non-distended, bowel sounds present x4, no hepatomegaly, no splenomegaly, no masses   Assessment/ Plan: 78 y.o. female    Postoperative hypothyroidism - Plan: TSH + free T4, levothyroxine  (SYNTHROID ) 100 MCG tablet  Stage 3b chronic kidney disease (HCC) - Plan: Renal Function Panel  Non-seasonal allergic rhinitis, unspecified trigger - Plan: ipratropium (ATROVENT ) 0.06 % nasal spray, fexofenadine  (ALLEGRA  ALLERGY) 60 MG tablet  Essential hypertension - Plan: amLODipine  (NORVASC ) 5 MG tablet, atenolol  (TENORMIN ) 25 MG tablet   Clinically euthyroid.  Check thyroid  levels.  Check renal function  panel given CKD 3B.  I have renally dosed Allegra  to replace Claritin .  Atrovent  nasal spray sent to replace nasal corticosteroid  Blood pressure well-controlled.  Medications have been renewed   Norene CHRISTELLA Fielding, DO [facility] [phone]     [1]  Allergies Allergen Reactions   Paclitaxel  Anaphylaxis   Emend [Fosaprepitant  Dimeglumine] Other (See Comments)    3 minutes into emend infusion patient began having facial flushing and redness to bilateral hands and neck, and light headedness   Codeine Nausea And Vomiting   Nsaids Other (See Comments)    Told to avoid due to kidney function   Penicillins Hives   Vibra-Tab [Doxycycline] Hives   Iodine  Rash   Latex Rash    Occasionally gets a localized rash on contact with certain latex products  [2]  Current Outpatient Medications:    acetaminophen  (TYLENOL ) 500 MG tablet, Take 2 tablets (1,000 mg total) by mouth every 6 (six) hours as needed for mild pain., Disp: 30 tablet, Rfl: 0   amLODipine  (NORVASC ) 5 MG tablet, Take 1 tablet (5 mg total) by mouth daily., Disp: 90 tablet, Rfl: 3   aspirin  EC 81 MG tablet, Take 81 mg by mouth at bedtime. Swallow whole., Disp: , Rfl:    atenolol  (TENORMIN ) 25 MG tablet, Take 0.5 tablets (12.5 mg total) by mouth 2 (two) times daily., Disp: 90 tablet, Rfl: 3   Calcium  Carb-Cholecalciferol  (CALCIUM  + VITAMIN D3 PO), Take 1 tablet by mouth 2 (two) times daily., Disp: , Rfl:    levothyroxine   (SYNTHROID ) 100 MCG tablet, Take 1 tablet (100 mcg total) by mouth in the morning., Disp: 90 tablet, Rfl: 3   loratadine  (CLARITIN ) 10 MG tablet, Take 10 mg by mouth daily., Disp: , Rfl:    Multiple Vitamin (MULTIVITAMIN) tablet, Take 1 tablet by mouth in the morning., Disp: , Rfl:    olaparib  (LYNPARZA ) 100 MG tablet, Take 1 tablet (100 mg total) by mouth 2 (two) times daily. Swallow whole. May take with food to decrease nausea and vomiting., Disp: 60 tablet, Rfl: 11   simvastatin  (ZOCOR ) 20 MG tablet, Take 1 tablet (20 mg total) by mouth daily., Disp: 90 tablet, Rfl: 3   triamcinolone  (NASACORT ) 55 MCG/ACT AERO nasal inhaler, Place 2 sprays into the nose daily., Disp: , Rfl:

## 2024-04-26 ENCOUNTER — Telehealth: Payer: Self-pay

## 2024-04-26 NOTE — Telephone Encounter (Signed)
 Patient states she will not be taking the medications

## 2024-04-26 NOTE — Telephone Encounter (Signed)
 Copied from CRM #[id number]. Topic: Clinical - Medication Question >> Apr 26, 2024  1:54 PM Joesph B wrote: Reason for CRM: patient has questions about fexofenadine  (ALLEGRA  ALLERGY) 60 MG tablet [[id number]]  ipratropium (ATROVENT ) 0.06 % nasal spray [[id number]],  She wants to speak to a nurse today. [phone]

## 2024-04-27 NOTE — Telephone Encounter (Signed)
 Patient aware, verbalized understanding.

## 2024-04-27 NOTE — Telephone Encounter (Signed)
 So as per the SIG on the rx. I have RENALLY dosed the med.  It is prescribed at a REDUCED dose for her kidney function

## 2024-05-04 ENCOUNTER — Ambulatory Visit: Admitting: Nurse Practitioner

## 2024-05-04 ENCOUNTER — Encounter: Payer: Self-pay | Admitting: Nurse Practitioner

## 2024-05-04 VITALS — BP 110/66 | HR 67 | Temp 96.6°F | Ht 65.0 in | Wt 155.0 lb

## 2024-05-04 DIAGNOSIS — J069 Acute upper respiratory infection, unspecified: Secondary | ICD-10-CM | POA: Diagnosis not present

## 2024-05-04 MED ORDER — ONDANSETRON 4 MG PO TBDP: 4.0000 mg | ORAL_TABLET | Freq: Three times a day (TID) | ORAL | 0 refills | Status: AC | PRN

## 2024-05-04 NOTE — Progress Notes (Signed)
 "  Subjective:    Patient ID: April Weber, female    DOB: 1946/04/07, 78 y.o.   MRN: [id number]   Chief Complaint: Cough and Nasal Congestion (Since last Sunday. Fatigue and chest burning with cough)   Cough This is a new problem. The current episode started in the past 7 days. The problem occurs every few minutes. The cough is Productive of sputum. Associated symptoms include nasal congestion, postnasal drip and rhinorrhea. Pertinent negatives include no chills, ear congestion, ear pain, fever or shortness of breath. She has tried OTC cough suppressant for the symptoms. The treatment provided mild relief.    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  HENT:  Positive for postnasal drip and rhinorrhea. Negative for ear pain.   Respiratory:  Positive for cough. Negative for shortness of breath.        Objective:   Physical Exam Constitutional:      Appearance: Normal appearance.  HENT:     Right Ear: Tympanic membrane normal.     Left Ear: Tympanic membrane normal.     Nose: Congestion and rhinorrhea present.     Mouth/Throat:     Pharynx: No oropharyngeal exudate or posterior oropharyngeal erythema.  Cardiovascular:     Rate and Rhythm: Normal rate and regular rhythm.      Heart sounds: Normal heart sounds.  Pulmonary:     Effort: Pulmonary effort is normal.     Breath sounds: Normal breath sounds.  Skin:    General: Skin is warm.  Neurological:     General: No focal deficit present.     Mental Status: She is alert and oriented to person, place, and time.  Psychiatric:        Mood and Affect: Mood normal.        Behavior: Behavior normal.    BP 110/66   Pulse 67   Temp (!) 96.6 F (35.9 C) (Temporal)   Ht 5' 5 (1.651 m)   Wt 155 lb (70.3 kg)   SpO2 99%   BMI 25.79 kg/m         Assessment & Plan:   April Weber in today with chief complaint of Cough and Nasal Congestion (Since last Sunday. Fatigue and chest burning with cough)   1. URI with cough and congestion (Primary) 1. Take meds as prescribed 2. Use a cool mist humidifier especially during the winter months and when heat has been humid. 3. Use saline nose sprays frequently 4. Saline irrigations of the nose can be very helpful if done frequently.  * 4X daily for 1 week*  * Use of a nettie pot can be helpful with this. Follow directions with this* 5. Drink plenty of fluids 6. Keep thermostat turn down low 7.For any cough or congestion- mucinex 8. For  fever or aces or pains- take tylenol  or ibuprofen appropriate for age and weight.  * for fevers greater than 101 orally you may alternate ibuprofen and tylenol  every  3 hours.       The above assessment and management plan was discussed with the patient. The patient verbalized understanding of and has agreed to the management plan. Patient is aware to call the clinic if symptoms persist or worsen. Patient is aware when to return to the clinic for a follow-up visit. Patient educated on when it is appropriate to go to the emergency department.   Mary-Margaret Gladis, FNP   "

## 2024-05-04 NOTE — Patient Instructions (Signed)

## 2024-05-08 ENCOUNTER — Other Ambulatory Visit: Payer: Self-pay

## 2024-05-11 ENCOUNTER — Other Ambulatory Visit: Payer: Self-pay

## 2024-05-11 ENCOUNTER — Other Ambulatory Visit (HOSPITAL_COMMUNITY): Payer: Self-pay

## 2024-05-11 NOTE — Progress Notes (Signed)
 Specialty Pharmacy Refill Coordination Note  April Weber is a 79 y.o. female contacted today regarding refills of specialty medication(s) Olaparib  (LYNPARZA )   Patient requested Delivery   Delivery date: 05/15/24   Verified address: [address]  [place] [id number]   Medication will be filled on: 05/14/24

## 2024-05-14 ENCOUNTER — Other Ambulatory Visit: Payer: Self-pay | Admitting: Hematology and Oncology

## 2024-05-14 ENCOUNTER — Other Ambulatory Visit: Payer: Self-pay

## 2024-05-14 MED ORDER — OLAPARIB 100 MG PO TABS
100.0000 mg | ORAL_TABLET | Freq: Two times a day (BID) | ORAL | 11 refills | Status: AC
  Filled 2024-05-14: qty 60, 30d supply, fill #0
  Filled 2024-06-08: qty 60, 30d supply, fill #1

## 2024-05-21 ENCOUNTER — Encounter: Payer: Self-pay | Admitting: Hematology and Oncology

## 2024-05-21 ENCOUNTER — Inpatient Hospital Stay: Admitting: Hematology and Oncology

## 2024-05-21 ENCOUNTER — Inpatient Hospital Stay: Attending: Gynecologic Oncology

## 2024-05-21 ENCOUNTER — Other Ambulatory Visit: Payer: Self-pay | Admitting: *Deleted

## 2024-05-21 VITALS — BP 130/71 | HR 73 | Temp 99.0°F | Resp 18 | Ht 65.0 in | Wt 157.4 lb

## 2024-05-21 DIAGNOSIS — C561 Malignant neoplasm of right ovary: Secondary | ICD-10-CM

## 2024-05-21 DIAGNOSIS — E039 Hypothyroidism, unspecified: Secondary | ICD-10-CM

## 2024-05-21 DIAGNOSIS — N1831 Chronic kidney disease, stage 3a: Secondary | ICD-10-CM

## 2024-05-21 LAB — CBC WITH DIFFERENTIAL/PLATELET
Abs Immature Granulocytes: 0.03 K/uL (ref 0.00–0.07)
Basophils Absolute: 0.1 K/uL (ref 0.0–0.1)
Basophils Relative: 1 %
Eosinophils Absolute: 0.3 K/uL (ref 0.0–0.5)
Eosinophils Relative: 4 %
HCT: 36.9 % (ref 36.0–46.0)
Hemoglobin: 13.1 g/dL (ref 12.0–15.0)
Immature Granulocytes: 0 %
Lymphocytes Relative: 9 %
Lymphs Abs: 0.7 K/uL (ref 0.7–4.0)
MCH: 34 pg (ref 26.0–34.0)
MCHC: 35.5 g/dL (ref 30.0–36.0)
MCV: 95.8 fL (ref 80.0–100.0)
Monocytes Absolute: 0.6 K/uL (ref 0.1–1.0)
Monocytes Relative: 8 %
Neutro Abs: 5.8 K/uL (ref 1.7–7.7)
Neutrophils Relative %: 78 %
Platelets: 241 K/uL (ref 150–400)
RBC: 3.85 MIL/uL — ABNORMAL LOW (ref 3.87–5.11)
RDW: 13.2 % (ref 11.5–15.5)
WBC: 7.5 K/uL (ref 4.0–10.5)
nRBC: 0 % (ref 0.0–0.2)

## 2024-05-21 LAB — COMPREHENSIVE METABOLIC PANEL WITH GFR
ALT: 16 U/L (ref 0–44)
AST: 22 U/L (ref 15–41)
Albumin: 4.2 g/dL (ref 3.5–5.0)
Alkaline Phosphatase: 65 U/L (ref 38–126)
Anion gap: 11 (ref 5–15)
BUN: 12 mg/dL (ref 8–23)
CO2: 23 mmol/L (ref 22–32)
Calcium: 9.2 mg/dL (ref 8.9–10.3)
Chloride: 102 mmol/L (ref 98–111)
Creatinine, Ser: 1.3 mg/dL — ABNORMAL HIGH (ref 0.44–1.00)
GFR, Estimated: 42 mL/min — ABNORMAL LOW
Glucose, Bld: 95 mg/dL (ref 70–99)
Potassium: 3.9 mmol/L (ref 3.5–5.1)
Sodium: 136 mmol/L (ref 135–145)
Total Bilirubin: 0.4 mg/dL (ref 0.0–1.2)
Total Protein: 6.7 g/dL (ref 6.5–8.1)

## 2024-05-21 LAB — T4, FREE: Free T4: 1.42 ng/dL (ref 0.80–2.00)

## 2024-05-21 LAB — TSH: TSH: 2.19 u[IU]/mL (ref 0.350–4.500)

## 2024-05-21 NOTE — Progress Notes (Signed)
 [facility] OFFICE PROGRESS NOTE  Patient Care Team: Jolinda Norene HERO, DO as PCP - General (Family Medicine) Rox Charleston, MD as Attending Physician (Obstetrics and Gynecology) Patty Anes, MD as Consulting Physician (Family Medicine) Vicci Mcardle, OD (Optometry) Care, [facility]) Lonn Hicks, MD as Consulting Physician (Hematology and Oncology) [name], Herlene Righter, MD as Consulting Physician (General Surgery) Ivonne Harlene RAMAN, RD as Dietitian (Dietician)  Assessment & Plan Hypothyroidism, unspecified type I will order TSH per patient request Right ovarian epithelial cancer [facility]) She was diagnosed with ovarian cancer in 2023, status post surgery and completion adjuvant treatment in April 2024 Final pathology: High grade papillary serous, 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  She had severe diverticulitis complicated by fistula requiring diverting loop ileostomy and aggressive antibiotics treatment, subsequently reversed She has been placed on olaparib  since June 2024  She tolerated elaborate well Labs are satisfactory The plan will be to continue Olaparib  at current dose Recent imaging study from October 2025 showed no evidence of disease I will see her again in 2 months She will complete treatment with olaparib  June 2026 Her next imaging study would be in May 2026 Stage 3a chronic kidney disease (HCC) She is prone to get dehydrated I recommend frequent oral fluid intake Overall, her renal function is stable Due to solitary functioning kidney, I will order CT imaging without contrast  Orders Placed This Encounter  Procedures   TSH    Standing Status:   Future    Number of Occurrences:   1    Expected Date:   05/21/2024    Expiration Date:   05/21/2025   T4, free    Standing Status:   Future    Number of Occurrences:   1    Expected Date:   05/21/2024    Expiration Date:   05/21/2025     Hicks Lonn,  MD  INTERVAL HISTORY: she returns for treatment follow-up Complications related to previous cycle of chemotherapy included none  PHYSICAL EXAMINATION: ECOG PERFORMANCE STATUS: 0 - Asymptomatic  Lab Results  Component Value Date   CAN125 10.0 03/20/2024   CAN125 11.5 02/16/2024   CAN125 10.8 11/15/2023      Latest Ref Rng & Units 05/21/2024   10:06 AM 03/20/2024    9:52 AM 02/16/2024    8:12 AM  CBC  WBC 4.0 - 10.5 K/uL 7.5  6.6  9.7   Hemoglobin 12.0 - 15.0 g/dL 86.8  87.5  86.4   Hematocrit 36.0 - 46.0 % 36.9  35.1  38.1   Platelets 150 - 400 K/uL 241  236  251       Chemistry      Component Value Date/Time   NA 136 05/21/2024 1006   NA 139 10/10/2023 0855   K 3.9 05/21/2024 1006   CL 102 05/21/2024 1006   CO2 23 05/21/2024 1006   BUN 12 05/21/2024 1006   BUN 20 10/10/2023 0855   CREATININE 1.30 (H) 05/21/2024 1006   CREATININE 0.93 08/10/2022 0857   CREATININE 0.99 02/05/2013 0757      Component Value Date/Time   CALCIUM  9.2 05/21/2024 1006   ALKPHOS 65 05/21/2024 1006   AST 22 05/21/2024 1006   AST 12 (L) 08/10/2022 0857   ALT 16 05/21/2024 1006   ALT 11 08/10/2022 0857   BILITOT 0.4 05/21/2024 1006   BILITOT 0.5 10/10/2023 0855   BILITOT 0.3 08/10/2022 0857       Vitals:  05/21/24 1031  BP: 130/71  Pulse: 73  Resp: 18  Temp: 99 F (37.2 C)  SpO2: 100%   Filed Weights   05/21/24 1031  Weight: 157 lb 6.4 oz (71.4 kg)   Other relevant data reviewed during this visit included CBC and CMP

## 2024-05-21 NOTE — Assessment & Plan Note (Signed)
 I will order TSH per patient request

## 2024-05-21 NOTE — Assessment & Plan Note (Addendum)
 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

## 2024-05-21 NOTE — Assessment & Plan Note (Addendum)
 She is prone to get dehydrated I recommend frequent oral fluid intake Overall, her renal function is stable Due to solitary functioning kidney, I will order CT imaging without contrast

## 2024-05-22 LAB — CA 125: Cancer Antigen (CA) 125: 9.8 U/mL (ref 0.0–38.1)

## 2024-06-08 ENCOUNTER — Other Ambulatory Visit: Payer: Self-pay

## 2024-06-12 ENCOUNTER — Other Ambulatory Visit: Payer: Self-pay | Admitting: Pharmacy Technician

## 2024-06-12 ENCOUNTER — Other Ambulatory Visit: Payer: Self-pay

## 2024-06-12 NOTE — Progress Notes (Signed)
 Specialty Pharmacy Refill Coordination Note  REDELL BHANDARI is a 79 y.o. female contacted today regarding refills of specialty medication(s) Olaparib  (LYNPARZA )   Patient requested Delivery   Delivery date: 06/14/24   Verified address: [address]  [place] [id number]   Medication will be filled on: 06/13/24

## 2024-06-13 ENCOUNTER — Other Ambulatory Visit: Payer: Self-pay

## 2024-06-29 ENCOUNTER — Inpatient Hospital Stay: Attending: Gynecologic Oncology | Admitting: Gynecologic Oncology

## 2024-08-20 ENCOUNTER — Inpatient Hospital Stay: Admitting: Hematology and Oncology

## 2024-08-20 ENCOUNTER — Inpatient Hospital Stay

## 2024-10-22 ENCOUNTER — Encounter: Payer: Self-pay | Admitting: Family Medicine

## 2025-01-28 ENCOUNTER — Ambulatory Visit: Payer: Self-pay
